# Patient Record
Sex: Female | Born: 1990 | Race: Black or African American | Hispanic: No | Marital: Single | Smoking: Current every day smoker
Health system: Southern US, Community
[De-identification: ages and names within clinical notes are randomized; demographics above are authoritative.]

## PROBLEM LIST (undated history)

## (undated) ENCOUNTER — Ambulatory Visit (HOSPITAL_COMMUNITY): Admission: EM | Discharge status: Against Medical Advice | Payer: Medicaid Other

## (undated) ENCOUNTER — Inpatient Hospital Stay (HOSPITAL_COMMUNITY): Payer: Self-pay

## (undated) DIAGNOSIS — I10 Essential (primary) hypertension: Secondary | ICD-10-CM

## (undated) DIAGNOSIS — F99 Mental disorder, not otherwise specified: Secondary | ICD-10-CM

## (undated) DIAGNOSIS — N739 Female pelvic inflammatory disease, unspecified: Secondary | ICD-10-CM

## (undated) DIAGNOSIS — F329 Major depressive disorder, single episode, unspecified: Secondary | ICD-10-CM

## (undated) DIAGNOSIS — Z8619 Personal history of other infectious and parasitic diseases: Secondary | ICD-10-CM

## (undated) DIAGNOSIS — G4730 Sleep apnea, unspecified: Secondary | ICD-10-CM

## (undated) DIAGNOSIS — A749 Chlamydial infection, unspecified: Secondary | ICD-10-CM

## (undated) DIAGNOSIS — A549 Gonococcal infection, unspecified: Secondary | ICD-10-CM

## (undated) DIAGNOSIS — Z8759 Personal history of other complications of pregnancy, childbirth and the puerperium: Secondary | ICD-10-CM

## (undated) DIAGNOSIS — O24419 Gestational diabetes mellitus in pregnancy, unspecified control: Secondary | ICD-10-CM

## (undated) DIAGNOSIS — N83209 Unspecified ovarian cyst, unspecified side: Secondary | ICD-10-CM

## (undated) DIAGNOSIS — F32A Depression, unspecified: Secondary | ICD-10-CM

## (undated) HISTORY — DX: Personal history of other complications of pregnancy, childbirth and the puerperium: Z87.59

## (undated) HISTORY — PX: KNEE SURGERY: SHX244

---
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
## 2010-03-25 ENCOUNTER — Emergency Department (HOSPITAL_COMMUNITY): Admission: EM | Admit: 2010-03-25 | Discharge: 2010-03-25 | Payer: Self-pay | Admitting: Family Medicine
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
## 2010-09-13 ENCOUNTER — Emergency Department (HOSPITAL_COMMUNITY): Admission: EM | Admit: 2010-09-13 | Discharge: 2010-09-14 | Payer: Self-pay | Admitting: Emergency Medicine

## 2010-09-13 IMAGING — CR DG CHEST 2V
2 series · 2 of 2 positions shown · non-contrast
Comparison: None.

CLINICAL DATA: Cough and shortness of breath.

CHEST - 2 VIEW

[w chest pa]
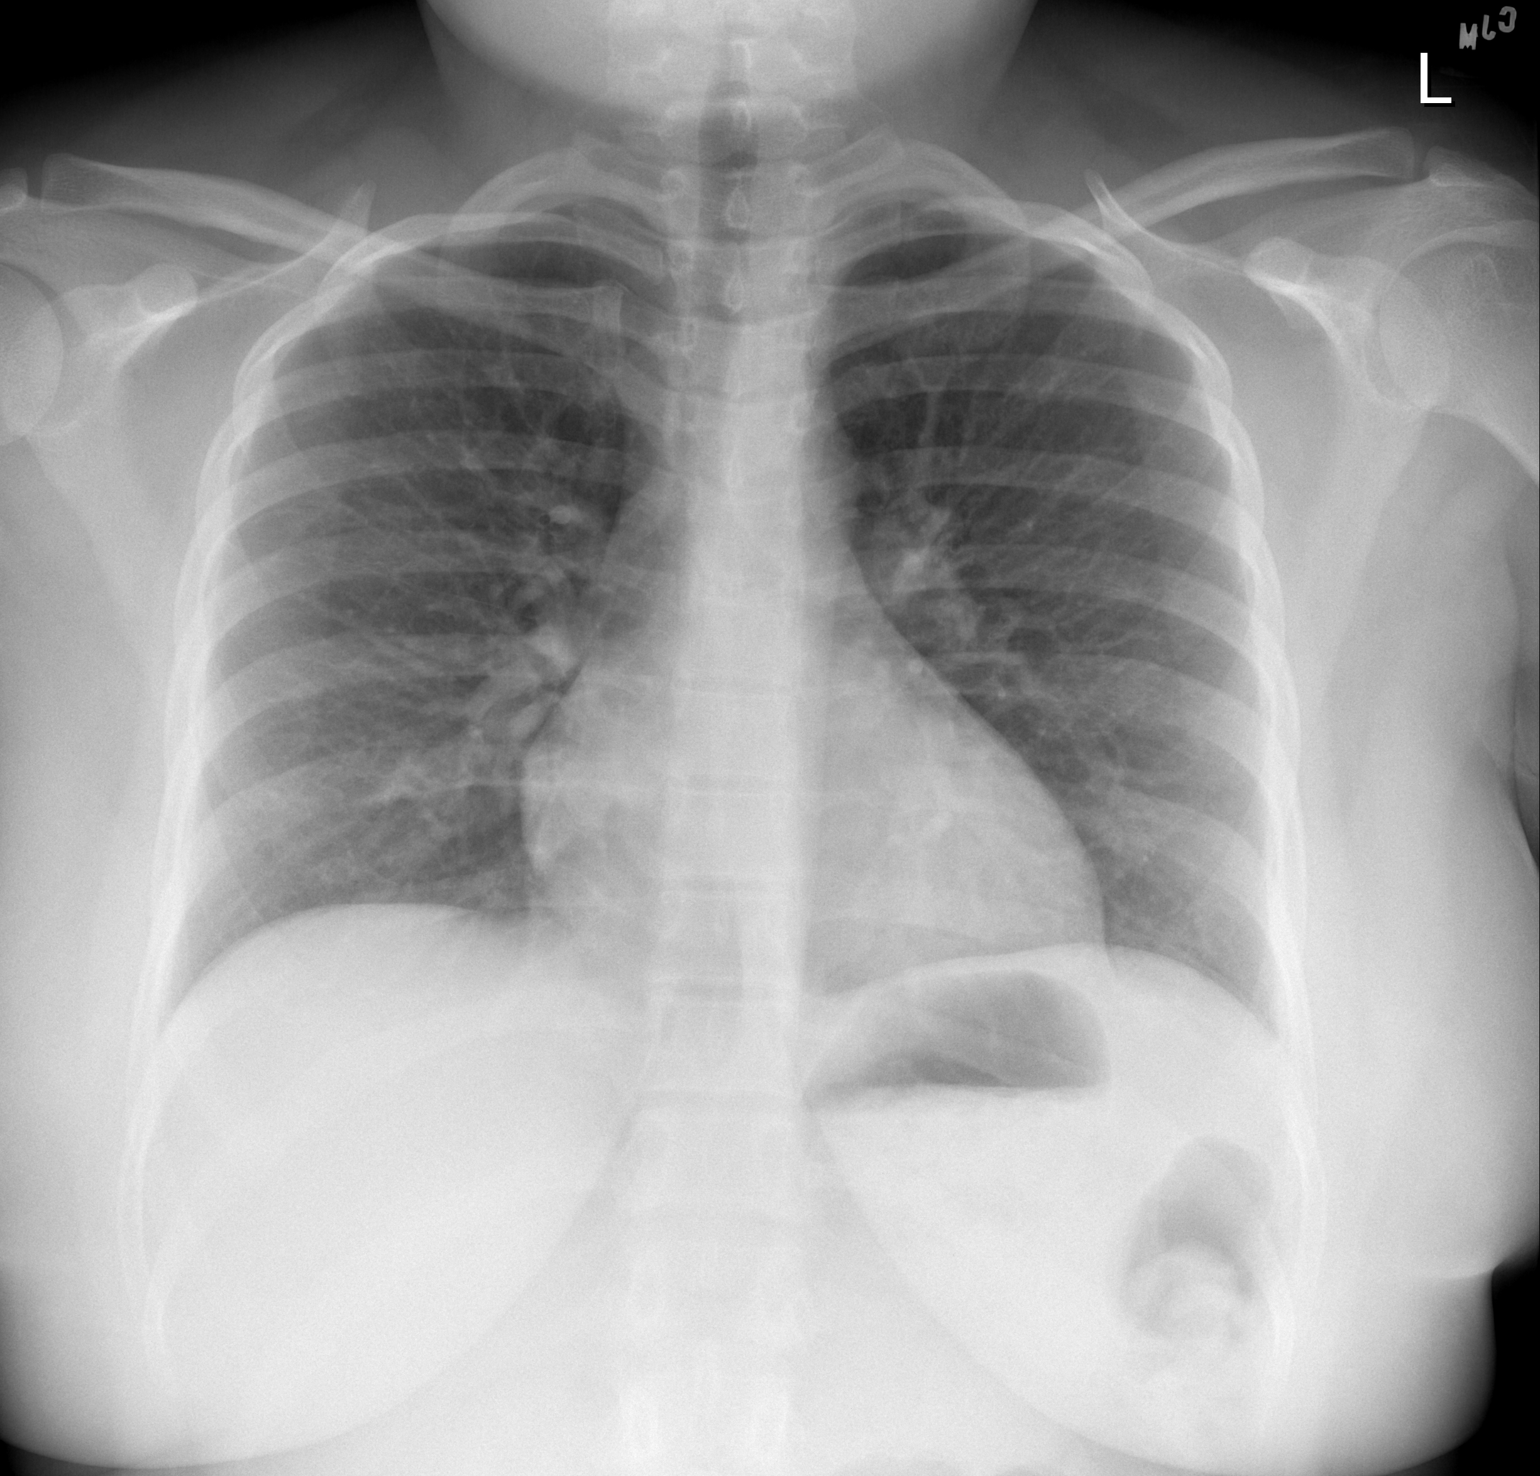

[w chest lat]
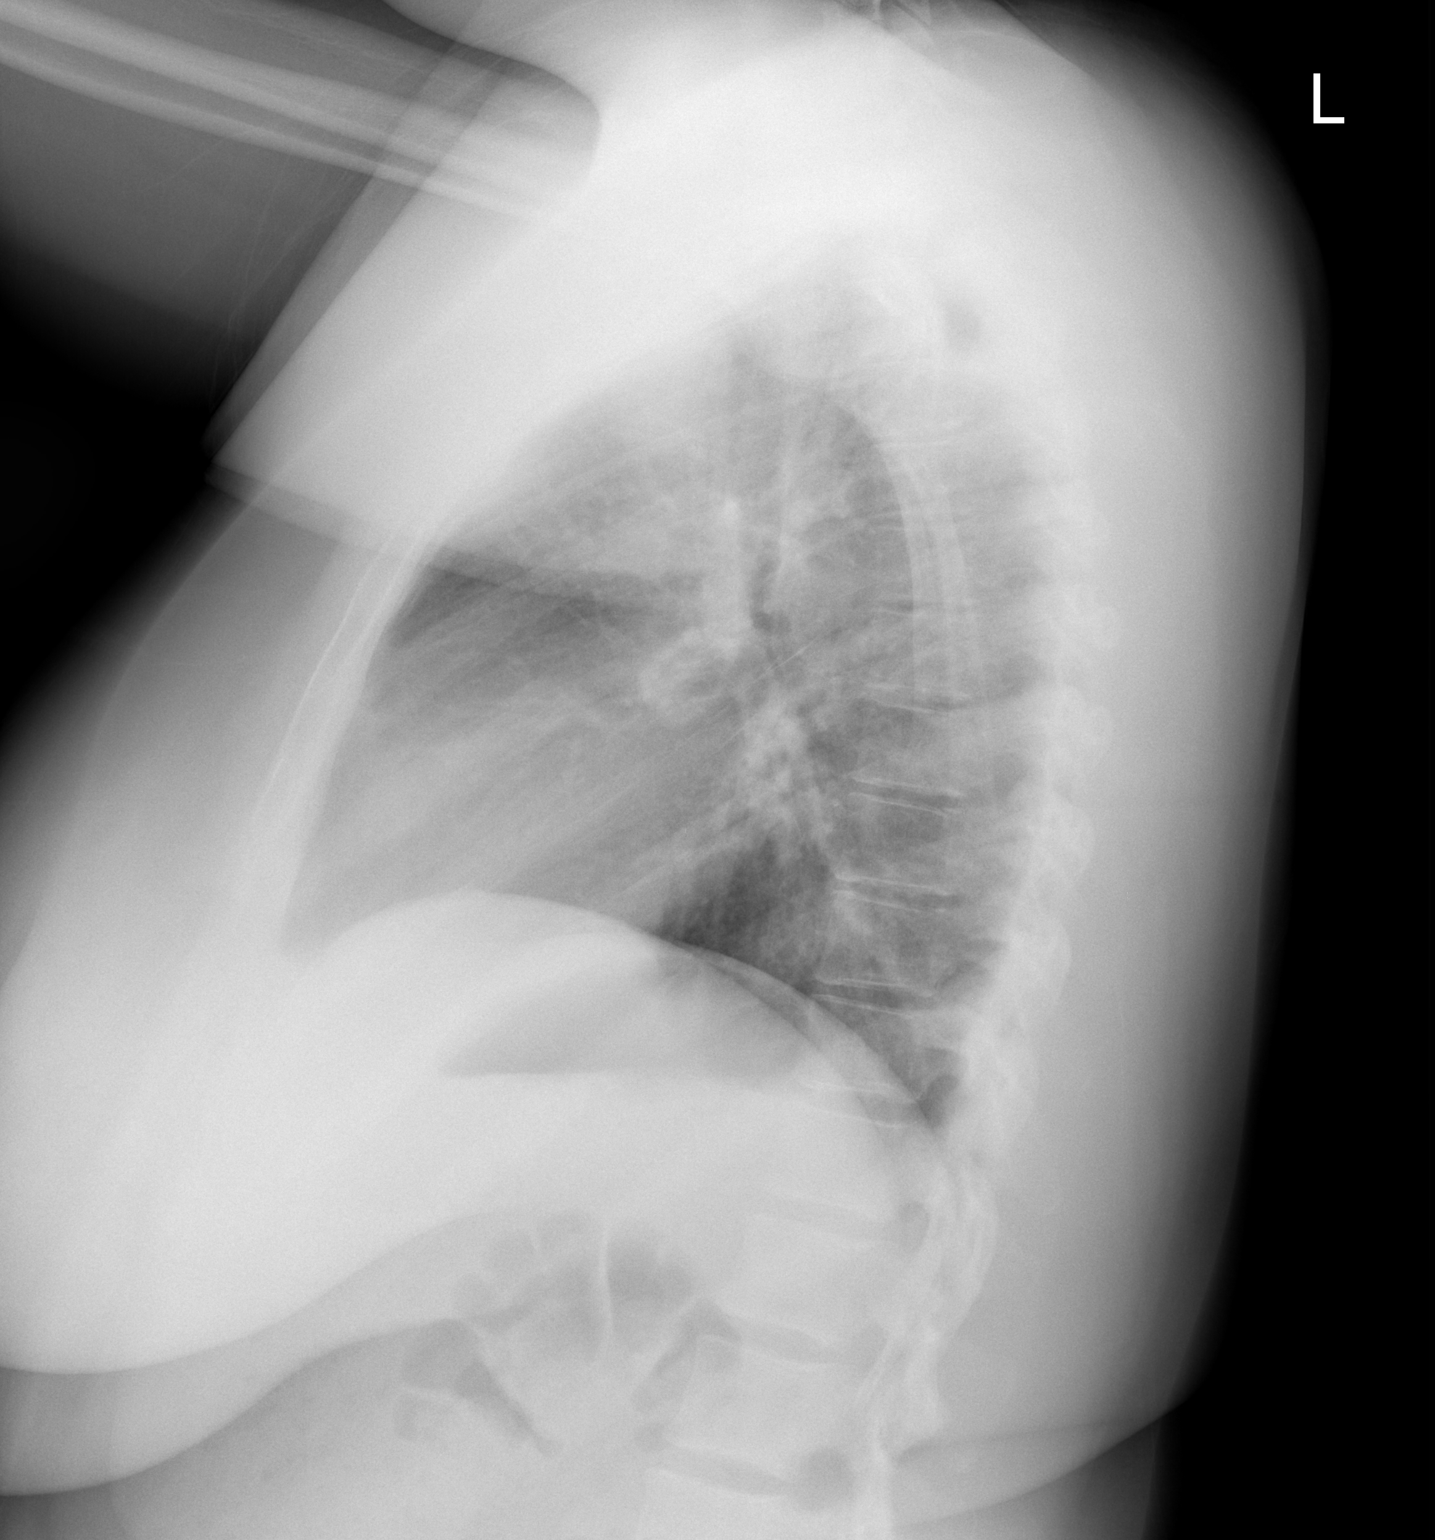

[2 of 2 positions shown; findings below may reference images not displayed]

FINDINGS: The lungs are well-aerated.  Mild retrocardiac left lower
lobe opacity may reflect mild pneumonia.  There is no evidence of
pleural effusion or pneumothorax.

The heart is normal in size; the mediastinal contour is within
normal limits.  No acute osseous abnormalities are seen.
IMPRESSION: Mild retrocardiac left lower lobe opacity may reflect mild
pneumonia.

## 2010-10-27 ENCOUNTER — Other Ambulatory Visit: Payer: Self-pay | Admitting: Emergency Medicine

## 2010-10-28 ENCOUNTER — Ambulatory Visit: Payer: Self-pay | Admitting: Psychiatry

## 2010-10-28 ENCOUNTER — Inpatient Hospital Stay (HOSPITAL_COMMUNITY): Admission: AD | Admit: 2010-10-28 | Discharge: 2010-11-01 | Payer: Self-pay | Admitting: Psychiatry

## 2010-10-29 ENCOUNTER — Encounter: Payer: Self-pay | Admitting: Psychiatry

## 2010-10-29 IMAGING — CT CT HEAD W/O CM
1 series · 16 of 30 positions shown, 20 images · non-contrast
Comparison: None available.

CLINICAL DATA: Severe headache and visual changes.

CT HEAD WITHOUT CONTRAST
TECHNIQUE: Contiguous axial images were obtained from the base of
the skull through the vertex without contrast.

[Series 2: headseq 4.8 h45s · axial · 0.41mm/px · z∈[-232,-80]mm · 16 of 36 slices shown, 20 images]
[im 2/36  brain]
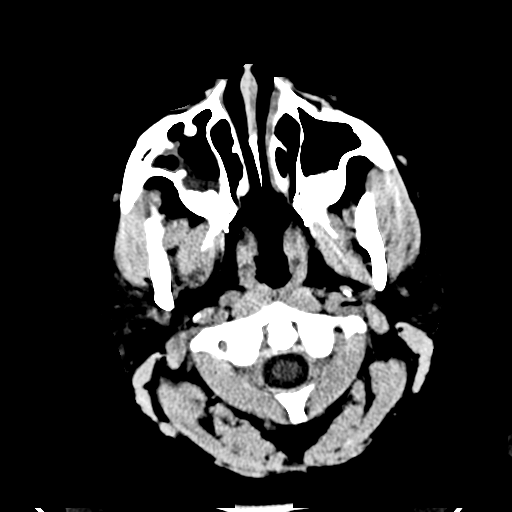
[im 2/36  bone]
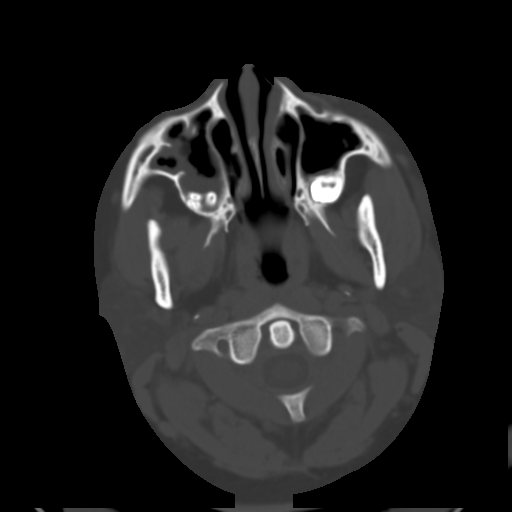
[im 4/36  brain]
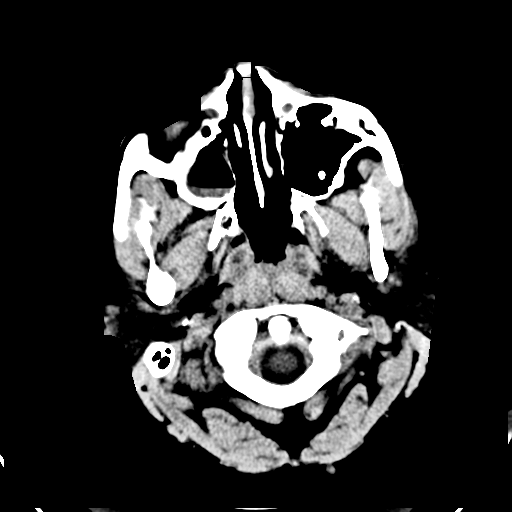
[im 7/36  brain]
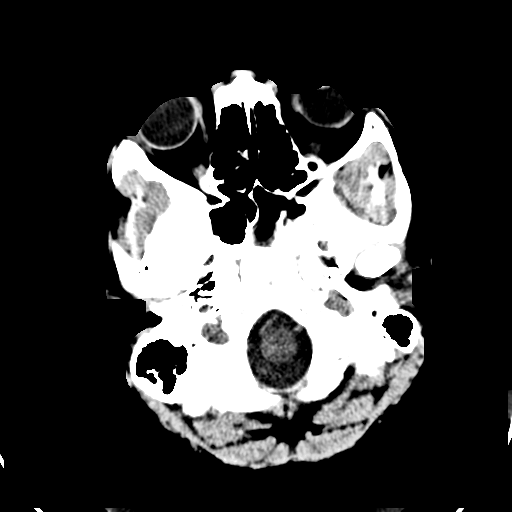
[im 9/36  brain]
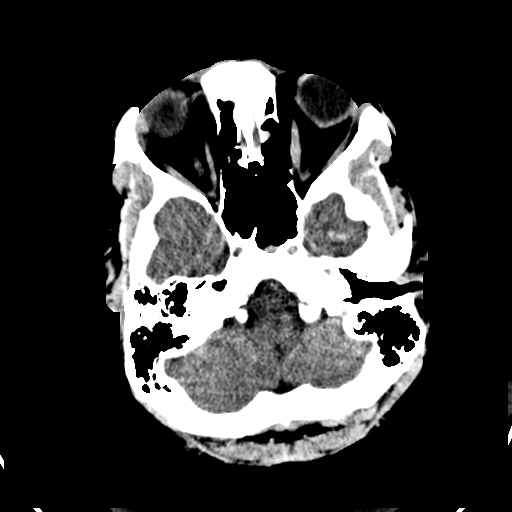
[im 10/36  brain]
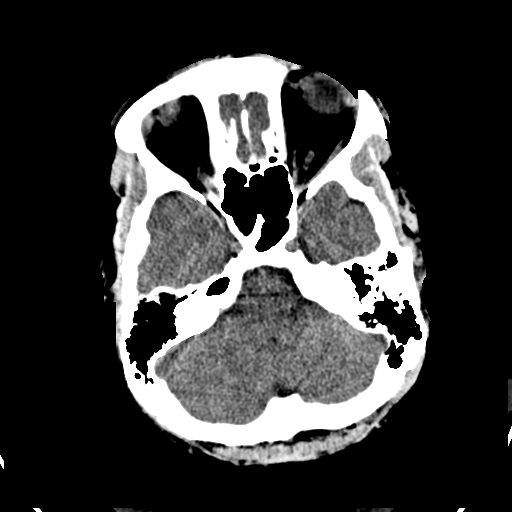
[im 10/36  bone]
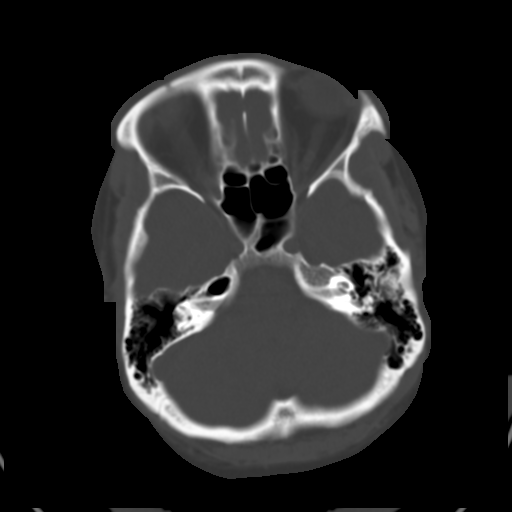
[im 13/36  brain]
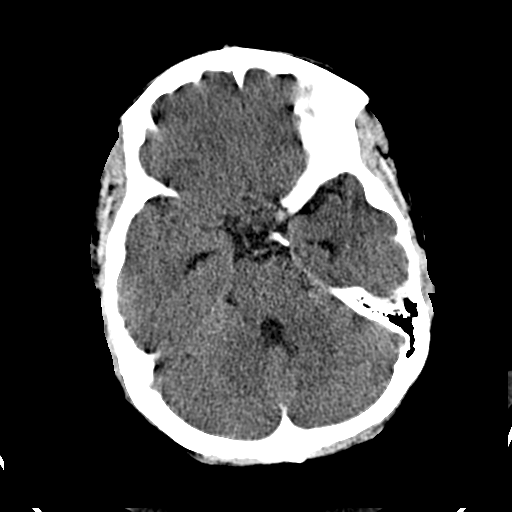
[im 15/36  brain]
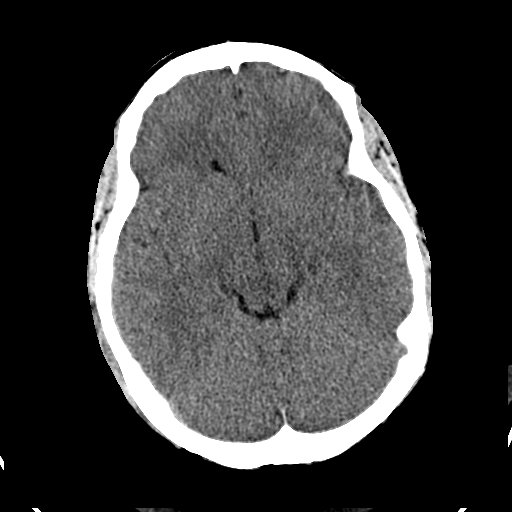
[im 17/36  brain]
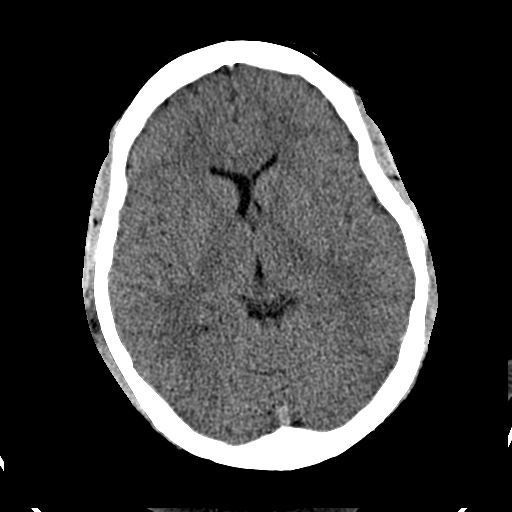
[im 19/36  brain]
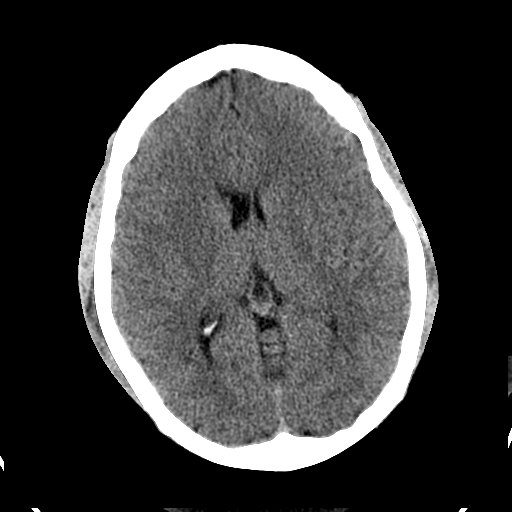
[im 19/36  bone]
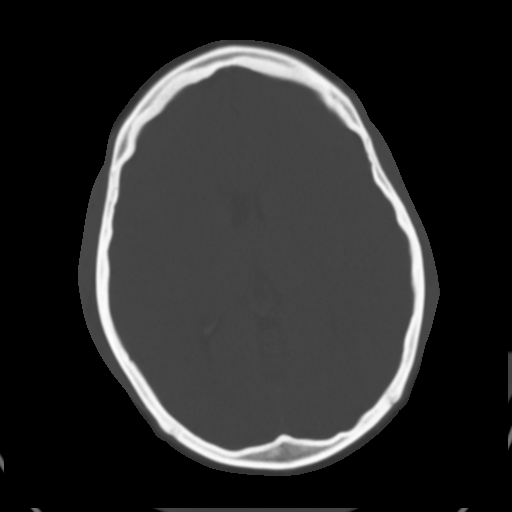
[im 21/36  brain]
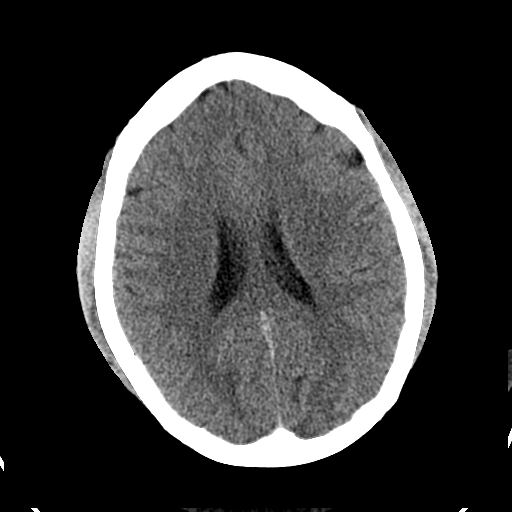
[im 23/36  brain]
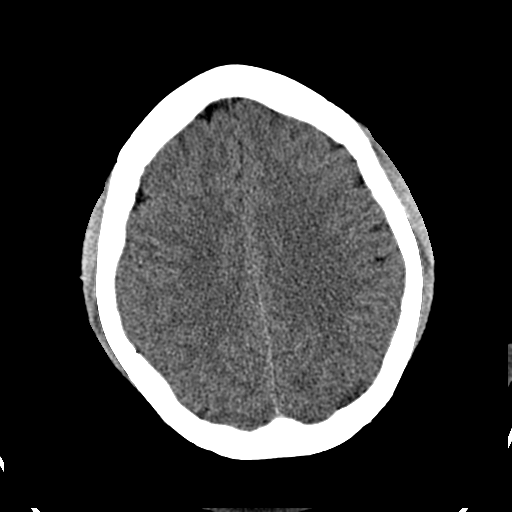
[im 26/36  brain]
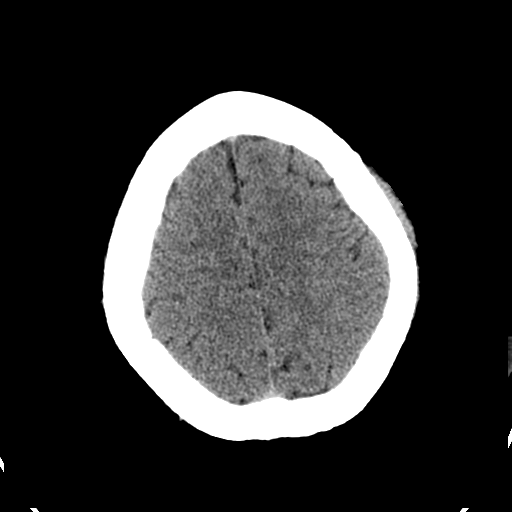
[im 27/36  brain]
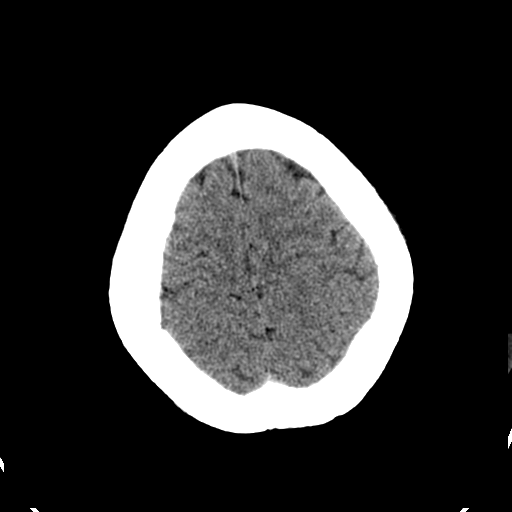
[im 27/36  bone]
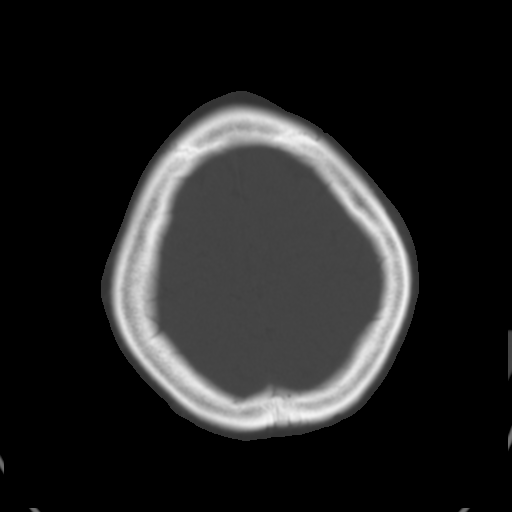
[im 29/36  brain]
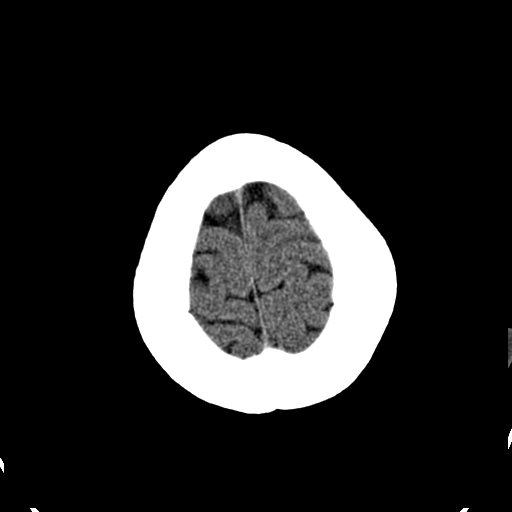
[im 32/36  brain]
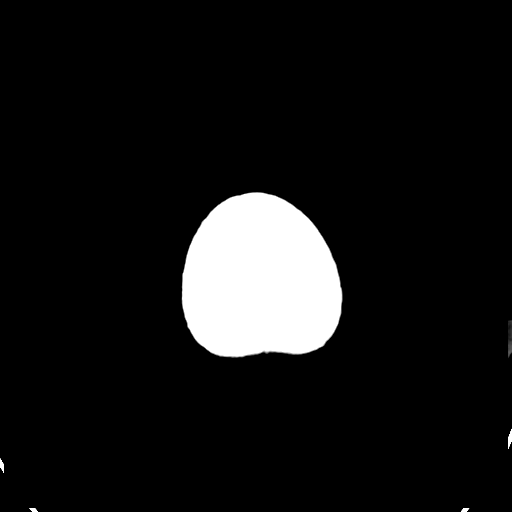
[im 34/36  brain]
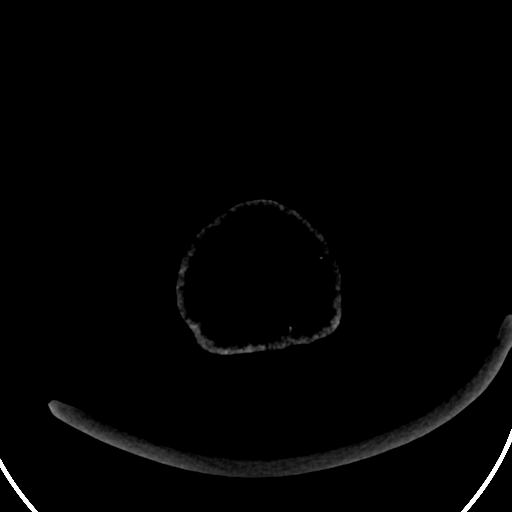

[16 of 30 positions shown; findings below may reference images not displayed]

FINDINGS: No acute cortical infarct, hemorrhage, mass lesion,
hydrocephalus, or significant extra-axial fluid collection is
present.  The maxillary sinuses are small with circumferential
mucosal thickening suggesting chronic disease.  An air-fluid level
is noted within the left sphenoid sinus.  An air-fluid level is
also present in the right maxillary sinus.  Mild mucosal thickening
is due to the ethmoid air cells.  The mastoid air cells are clear
bilaterally.  The osseous skull is intact.
IMPRESSION: 1.  Normal CT the brain.
2.  Sinus disease with acute on chronic disease in the maxillary
sinuses and acute disease in the left sphenoid sinus.

## 2010-11-03 ENCOUNTER — Emergency Department (HOSPITAL_COMMUNITY): Admission: EM | Admit: 2010-11-03 | Discharge: 2010-08-02 | Payer: Self-pay | Admitting: Emergency Medicine

## 2011-02-07 LAB — ACETAMINOPHEN LEVEL

## 2011-02-07 LAB — DIFFERENTIAL
Basophils Absolute: 0 10*3/uL (ref 0.0–0.1)
Basophils Relative: 0 % (ref 0–1)
Eosinophils Absolute: 0 10*3/uL (ref 0.0–0.7)
Monocytes Relative: 4 % (ref 3–12)
Neutro Abs: 9.8 10*3/uL — ABNORMAL HIGH (ref 1.7–7.7)
Neutrophils Relative %: 79 % — ABNORMAL HIGH (ref 43–77)

## 2011-02-07 LAB — RAPID URINE DRUG SCREEN, HOSP PERFORMED
Benzodiazepines: NOT DETECTED
Cocaine: NOT DETECTED
Opiates: NOT DETECTED
Tetrahydrocannabinol: NOT DETECTED

## 2011-02-07 LAB — GLUCOSE, RANDOM: Glucose, Bld: 78 mg/dL (ref 70–99)

## 2011-02-07 LAB — POCT PREGNANCY, URINE: Preg Test, Ur: NEGATIVE

## 2011-02-07 LAB — URINE CULTURE: Colony Count: >=100000

## 2011-02-07 LAB — URINE MICROSCOPIC-ADD ON

## 2011-02-07 LAB — BASIC METABOLIC PANEL
BUN: 10 mg/dL (ref 6–23)
Calcium: 9.4 mg/dL (ref 8.4–10.5)
GFR calc non Af Amer: >60 mL/min (ref >60)
Potassium: 3.4 mEq/L — ABNORMAL LOW (ref 3.5–5.1)

## 2011-02-07 LAB — RPR: RPR Ser Ql: NONREACTIVE

## 2011-02-07 LAB — LIPID PANEL

## 2011-02-07 LAB — CBC
Hemoglobin: 15.7 g/dL — ABNORMAL HIGH (ref 12.0–15.0)
MCH: 28.6 pg (ref 26.0–34.0)
MCHC: 34.1 g/dL (ref 30.0–36.0)
Platelets: 323 10*3/uL (ref 150–400)
RDW: 13.4 % (ref 11.5–15.5)

## 2011-02-07 LAB — HEMOGLOBIN A1C: Mean Plasma Glucose: 105 mg/dL (ref <117)

## 2011-02-07 LAB — URINALYSIS, ROUTINE W REFLEX MICROSCOPIC
Bilirubin Urine: NEGATIVE
Hgb urine dipstick: NEGATIVE
Protein, ur: NEGATIVE mg/dL
Urobilinogen, UA: 0.2 mg/dL (ref 0.0–1.0)

## 2011-02-09 LAB — POCT PREGNANCY, URINE

## 2011-02-14 LAB — URINE CULTURE: Colony Count: >=100000

## 2011-02-14 LAB — WET PREP, GENITAL: Yeast Wet Prep HPF POC: NONE SEEN

## 2011-02-14 LAB — POCT URINALYSIS DIP (DEVICE)
Hgb urine dipstick: NEGATIVE
Ketones, ur: NEGATIVE mg/dL
Protein, ur: NEGATIVE mg/dL
Specific Gravity, Urine: 1.025 (ref 1.005–1.030)
Urobilinogen, UA: 4 mg/dL — ABNORMAL HIGH (ref 0.0–1.0)

## 2011-02-14 LAB — POCT PREGNANCY, URINE

## 2011-02-14 LAB — GC/CHLAMYDIA PROBE AMP, GENITAL

## 2011-03-03 LAB — URINALYSIS, ROUTINE W REFLEX MICROSCOPIC
Bilirubin Urine: NEGATIVE
Nitrite: NEGATIVE
Protein, ur: NEGATIVE mg/dL
Specific Gravity, Urine: 1.019 (ref 1.005–1.030)
Urobilinogen, UA: 0.2 mg/dL (ref 0.0–1.0)

## 2011-03-03 LAB — URINE MICROSCOPIC-ADD ON

## 2011-03-03 LAB — URINE CULTURE: Colony Count: 65000

## 2011-03-06 LAB — URINALYSIS, ROUTINE W REFLEX MICROSCOPIC
Bilirubin Urine: NEGATIVE
Glucose, UA: NEGATIVE mg/dL
Ketones, ur: NEGATIVE mg/dL
Protein, ur: 30 mg/dL — AB
Urobilinogen, UA: 1 mg/dL (ref 0.0–1.0)

## 2011-03-06 LAB — URINE CULTURE: Colony Count: >=100000

## 2011-03-06 LAB — POCT PREGNANCY, URINE: Preg Test, Ur: NEGATIVE

## 2011-03-06 LAB — URINE MICROSCOPIC-ADD ON
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
## 2012-03-20 ENCOUNTER — Emergency Department (HOSPITAL_COMMUNITY): Payer: Medicaid Other

## 2012-03-20 ENCOUNTER — Emergency Department (HOSPITAL_COMMUNITY)
Admission: EM | Admit: 2012-03-20 | Discharge: 2012-03-20 | Disposition: A | Discharge status: Home / Self Care | Payer: Medicaid Other | Attending: Emergency Medicine | Admitting: Emergency Medicine

## 2012-03-20 ENCOUNTER — Encounter (HOSPITAL_COMMUNITY): Payer: Self-pay | Admitting: *Deleted

## 2012-03-20 DIAGNOSIS — R059 Cough, unspecified: Secondary | ICD-10-CM | POA: Insufficient documentation

## 2012-03-20 DIAGNOSIS — R0989 Other specified symptoms and signs involving the circulatory and respiratory systems: Secondary | ICD-10-CM | POA: Insufficient documentation

## 2012-03-20 DIAGNOSIS — R05 Cough: Secondary | ICD-10-CM

## 2012-03-20 DIAGNOSIS — R072 Precordial pain: Secondary | ICD-10-CM | POA: Insufficient documentation

## 2012-03-20 DIAGNOSIS — R042 Hemoptysis: Secondary | ICD-10-CM | POA: Insufficient documentation

## 2012-03-20 DIAGNOSIS — R110 Nausea: Secondary | ICD-10-CM | POA: Insufficient documentation

## 2012-03-20 DIAGNOSIS — R0609 Other forms of dyspnea: Secondary | ICD-10-CM | POA: Insufficient documentation

## 2012-03-20 DIAGNOSIS — E669 Obesity, unspecified: Secondary | ICD-10-CM | POA: Insufficient documentation

## 2012-03-20 DIAGNOSIS — R0602 Shortness of breath: Secondary | ICD-10-CM | POA: Insufficient documentation

## 2012-03-20 LAB — CBC
HCT: 40.7 % (ref 36.0–46.0)
Hemoglobin: 13.5 g/dL (ref 12.0–15.0)
MCV: 83.7 fL (ref 78.0–100.0)
Platelets: 269 10*3/uL (ref 150–400)
RBC: 4.86 MIL/uL (ref 3.87–5.11)
WBC: 10.3 10*3/uL (ref 4.0–10.5)

## 2012-03-20 LAB — COMPREHENSIVE METABOLIC PANEL
ALT: 15 U/L (ref 0–35)
Alkaline Phosphatase: 82 U/L (ref 39–117)
Chloride: 102 mEq/L (ref 96–112)
GFR calc Af Amer: >90 mL/min (ref >90)
Glucose, Bld: 76 mg/dL (ref 70–99)
Potassium: 4.1 mEq/L (ref 3.5–5.1)
Sodium: 137 mEq/L (ref 135–145)
Total Protein: 7 g/dL (ref 6.0–8.3)

## 2012-03-20 LAB — PROTIME-INR: Prothrombin Time: 13.6 seconds (ref 11.6–15.2)

## 2012-03-20 LAB — DIFFERENTIAL
Eosinophils Relative: 2 % (ref 0–5)
Lymphocytes Relative: 33 % (ref 12–46)
Lymphs Abs: 3.4 10*3/uL (ref 0.7–4.0)
Monocytes Absolute: 0.5 10*3/uL (ref 0.1–1.0)
Monocytes Relative: 5 % (ref 3–12)

## 2012-03-20 IMAGING — CR DG CHEST 2V
2 series · 2 of 2 positions shown · non-contrast
Comparison: [DATE]

CLINICAL DATA: Chest pain.  Cough.  Hemoptysis.

CHEST - 2 VIEW

[w chest pa]
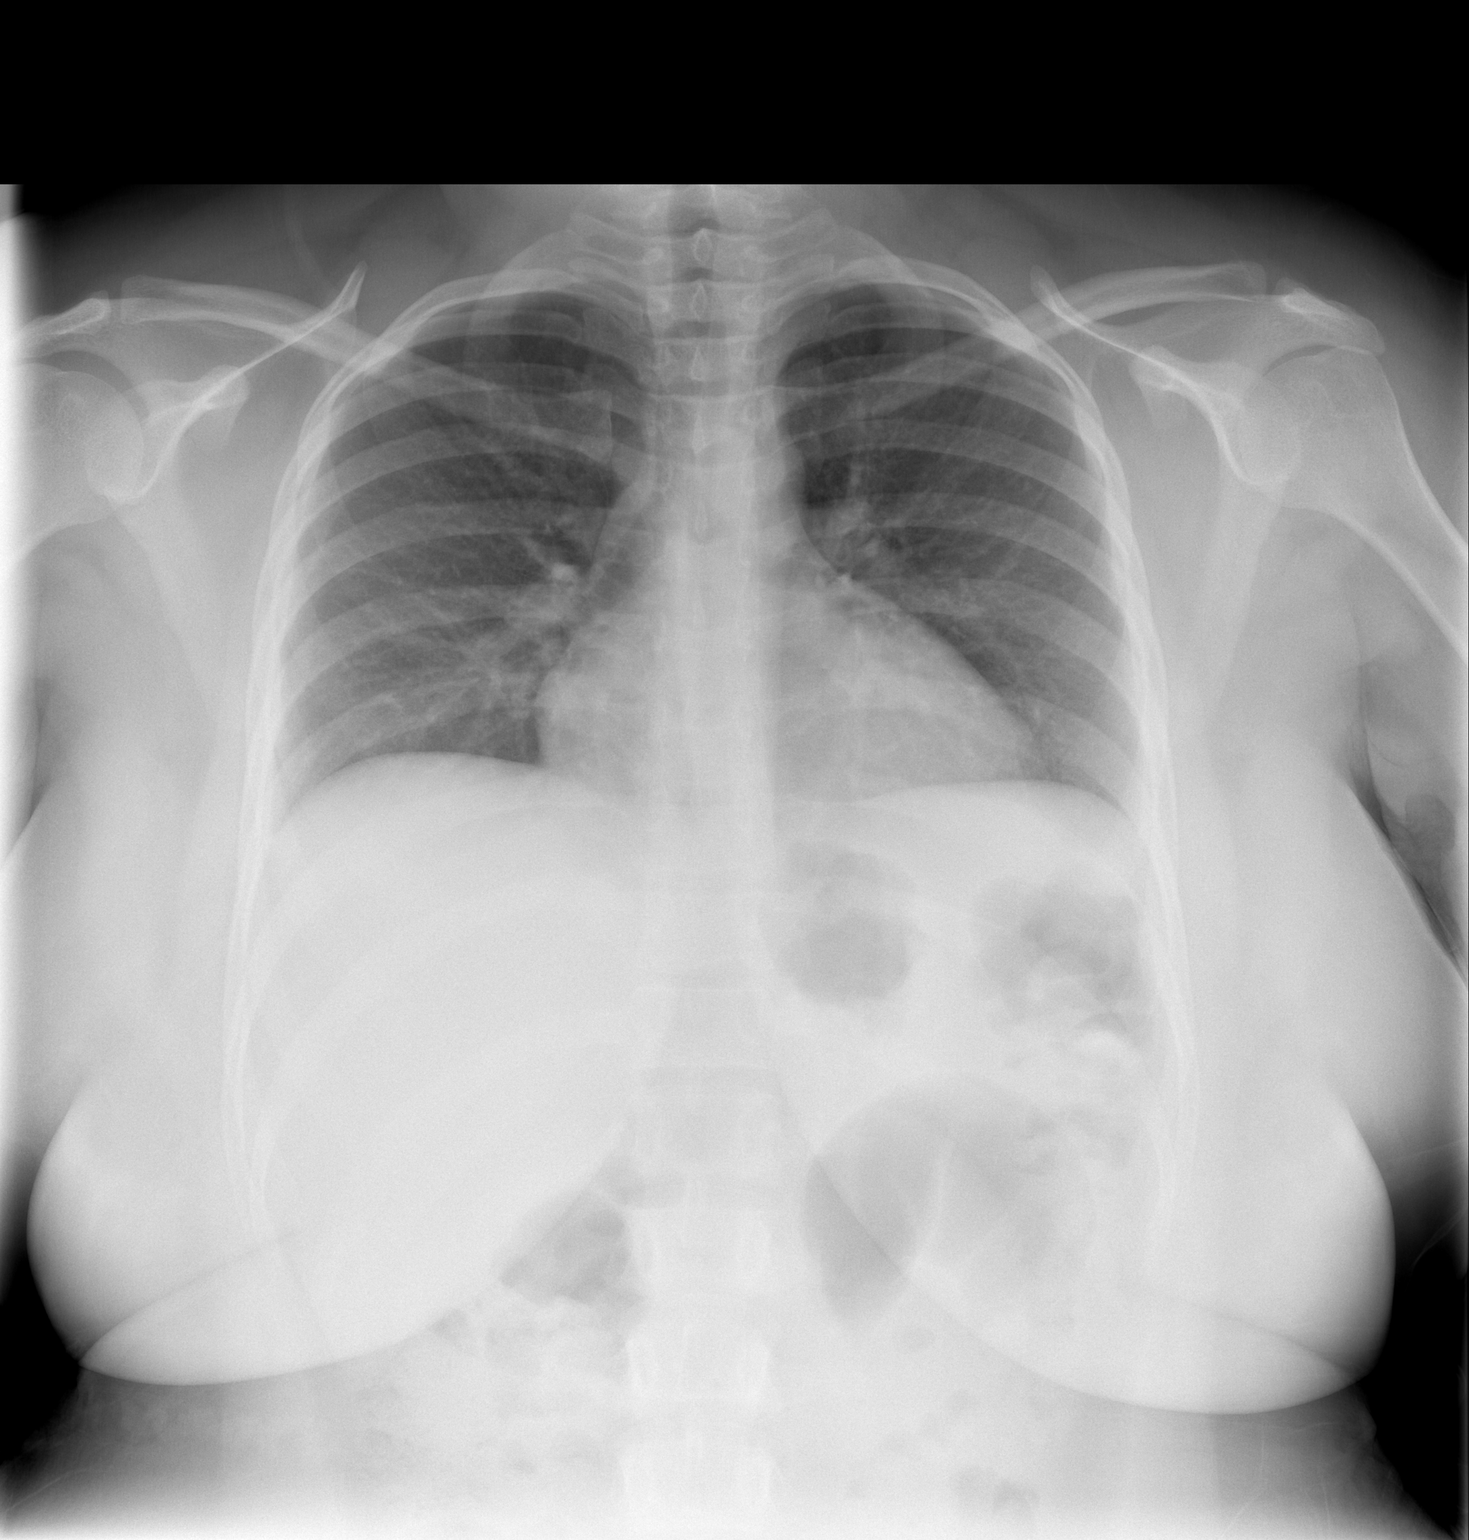

[w chest lat]
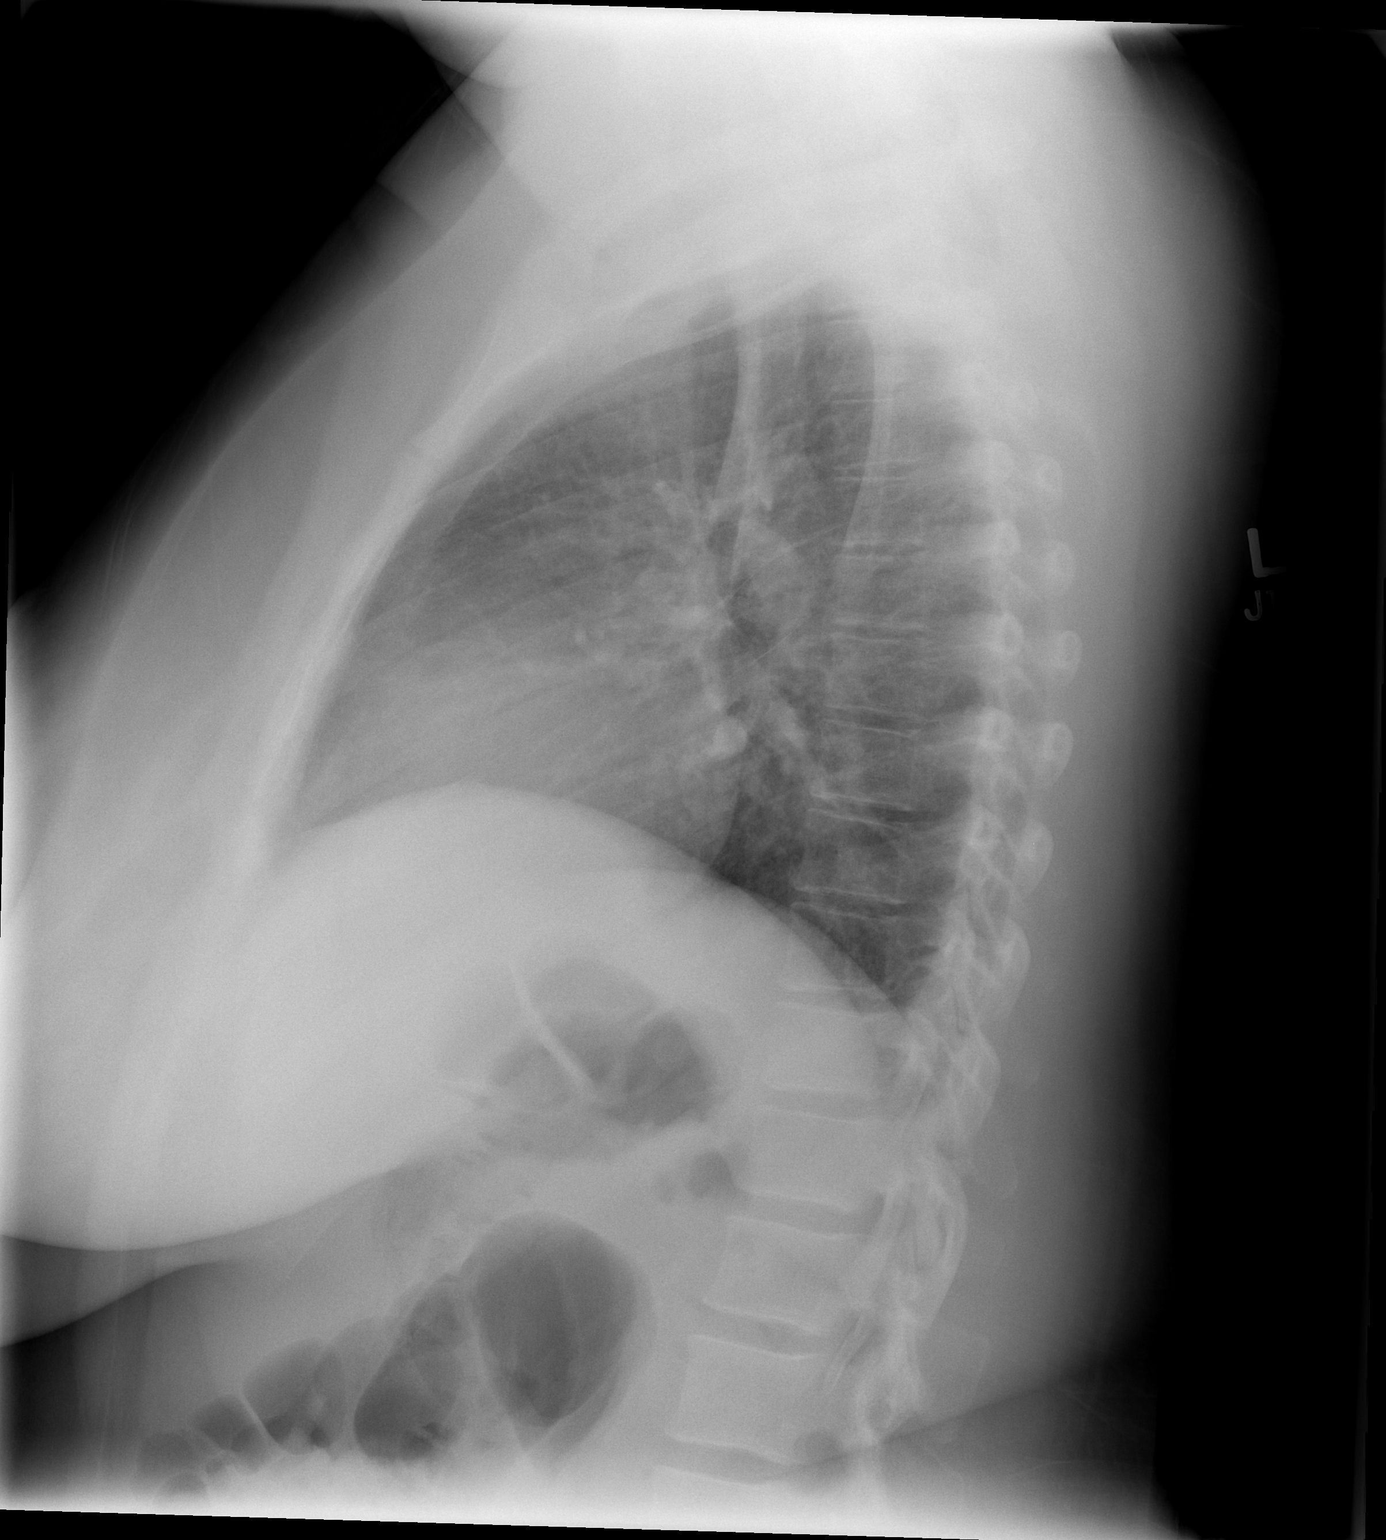

[2 of 2 positions shown; findings below may reference images not displayed]

FINDINGS: The patient has taken a poor inspiration.  Allowing for
that, heart mediastinal shadows are normal.  Lungs are clear.  No
effusions.  No bony abnormalities.
IMPRESSION: Poor inspiration.  Normal chest allowing for that.

## 2012-03-20 IMAGING — CT CT ANGIO CHEST
2 of 6 series · 19 of 46 positions shown · IV contrast (APPLIED)
Comparison: Chest radiography same day

CLINICAL DATA: Cough and shortness of breath.  Nausea.  Some
hemoptysis.

CT ANGIOGRAPHY CHEST
TECHNIQUE: Multidetector CT imaging of the chest using the
standard protocol during bolus administration of intravenous
contrast. Multiplanar reconstructed images including MIPs were
obtained and reviewed to evaluate the vascular anatomy.
Contrast: 100mL OMNIPAQUE IOHEXOL 350 MG/ML SOLN

[Series 8: pulm embolism 1.0 b25f thin · axial · 0.70mm/px · z∈[-238,-8]mm · 16 of 251 slices shown]
[im 11/251  lung]
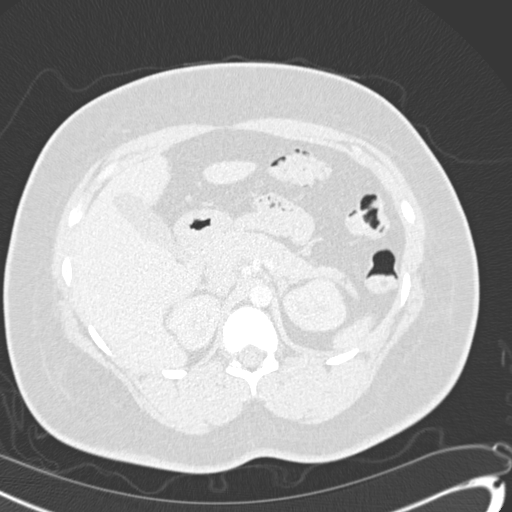
[im 33/251  soft-tissue]
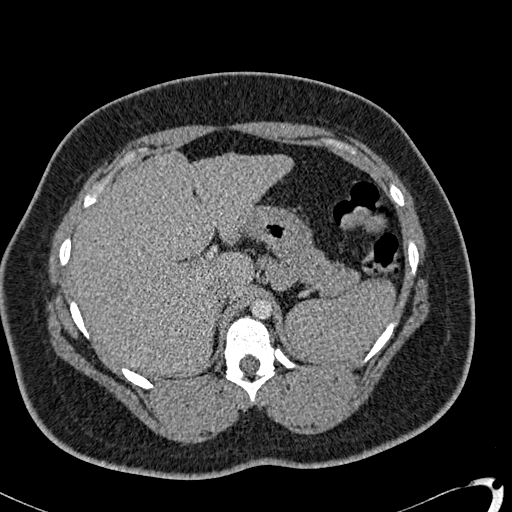
[im 44/251  lung]
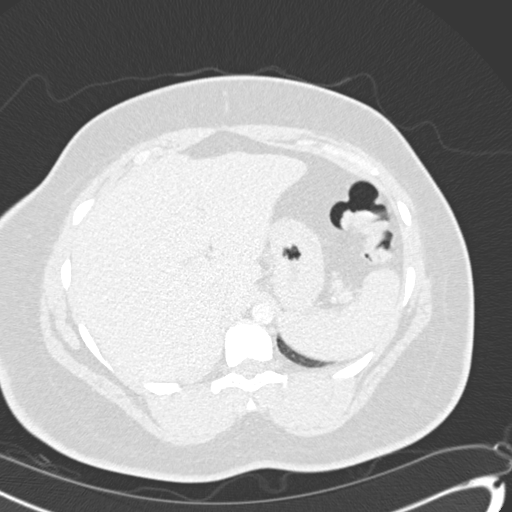
[im 55/251  soft-tissue]
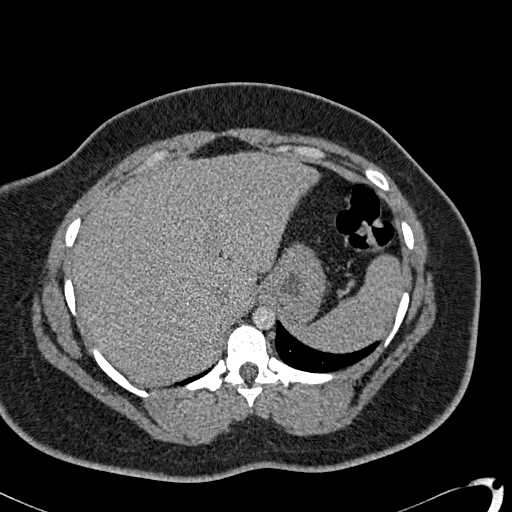
[im 77/251  lung]
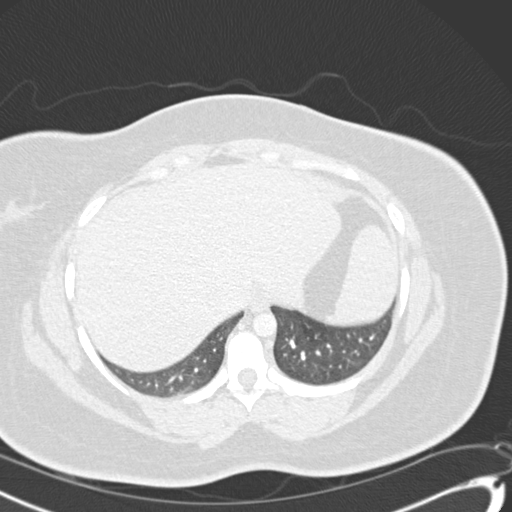
[im 87/251  soft-tissue]
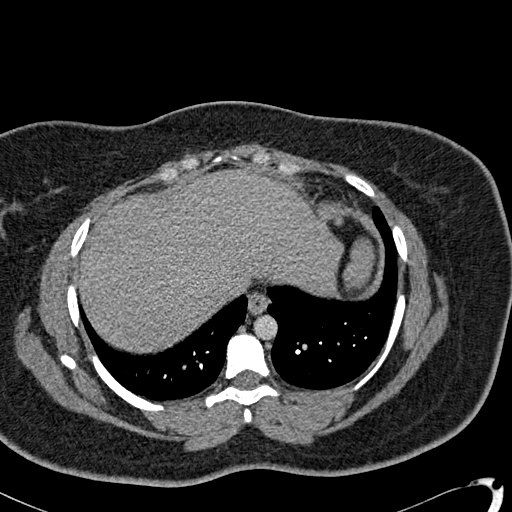
[im 98/251  lung]
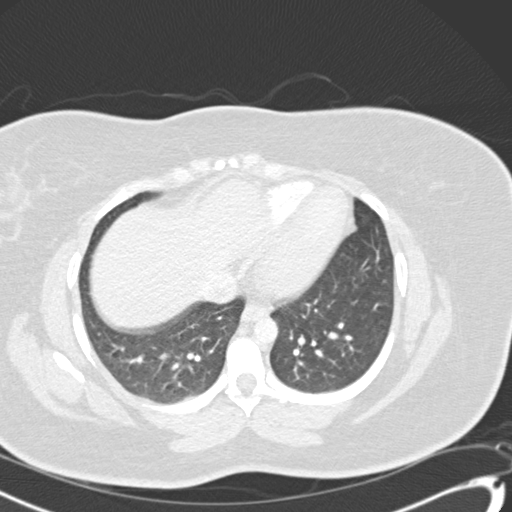
[im 120/251  soft-tissue]
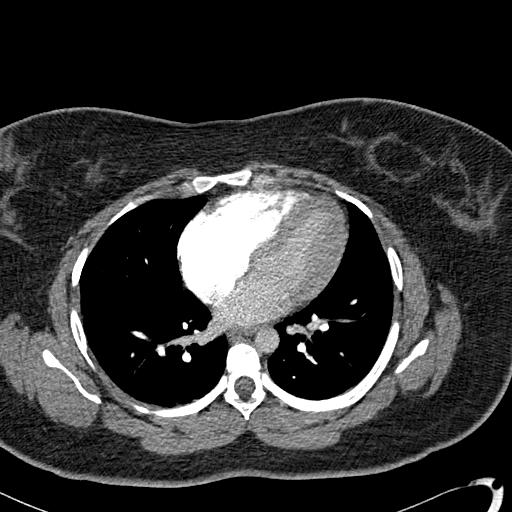
[im 131/251  lung]
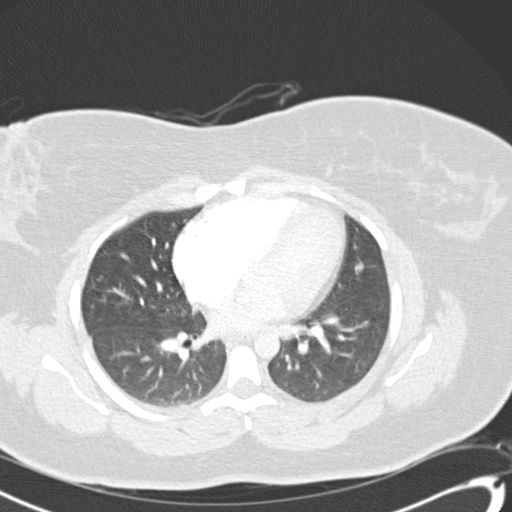
[im 153/251  soft-tissue]
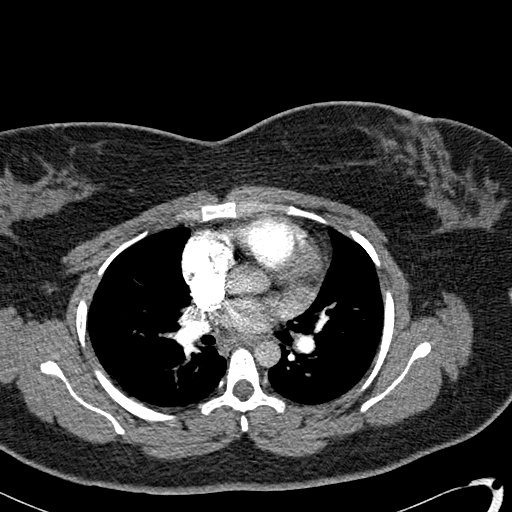
[im 164/251  lung]
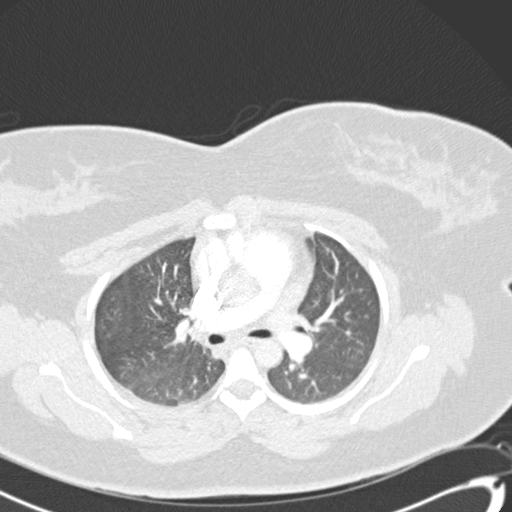
[im 174/251  soft-tissue]
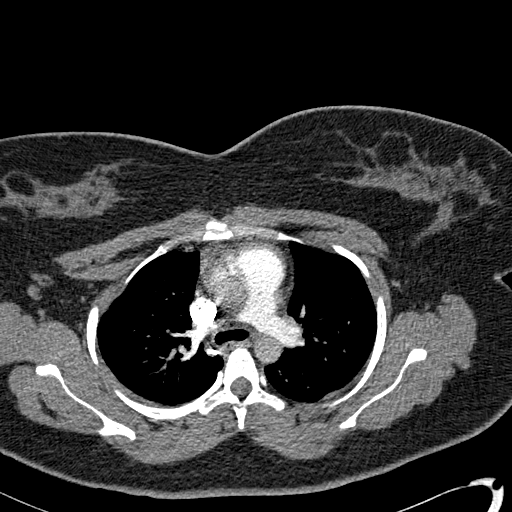
[im 196/251  lung]
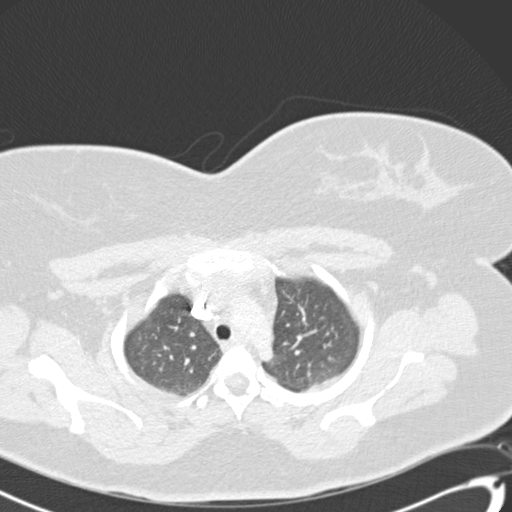
[im 207/251  soft-tissue]
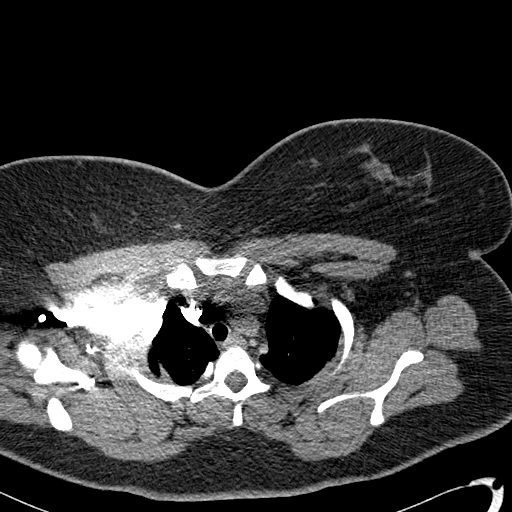
[im 218/251  lung]
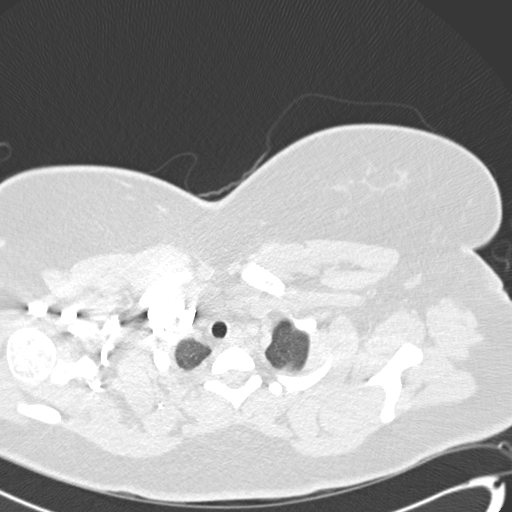
[im 240/251  soft-tissue]
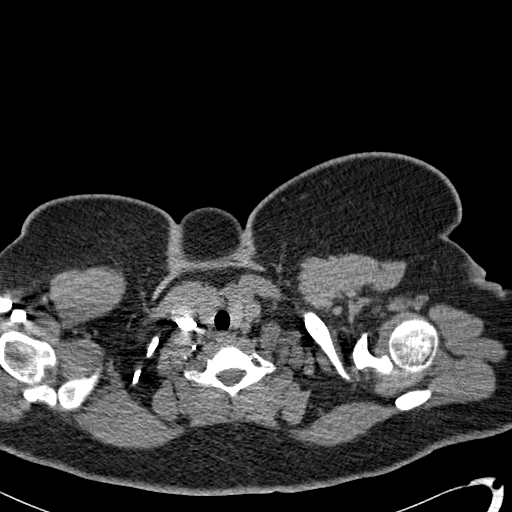

[Series 602: coronal · coronal · 0.70mm/px · 3 of 83 slices shown]
[im 21/83  soft-tissue]
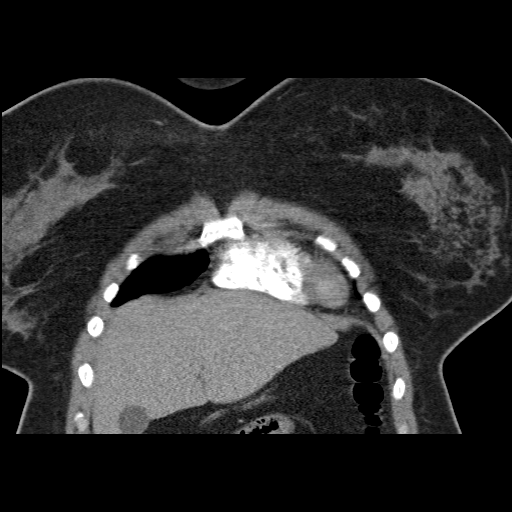
[im 42/83  soft-tissue]
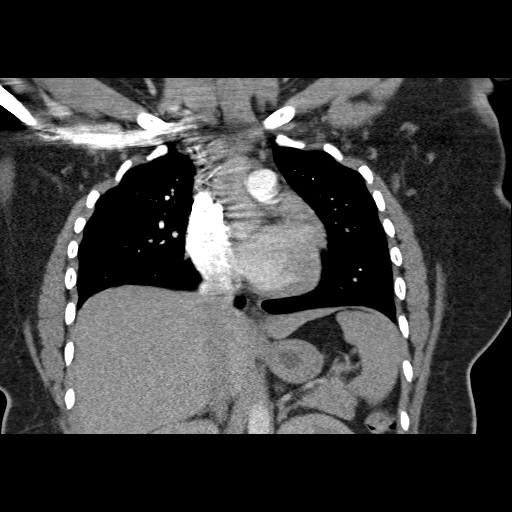
[im 62/83  soft-tissue]
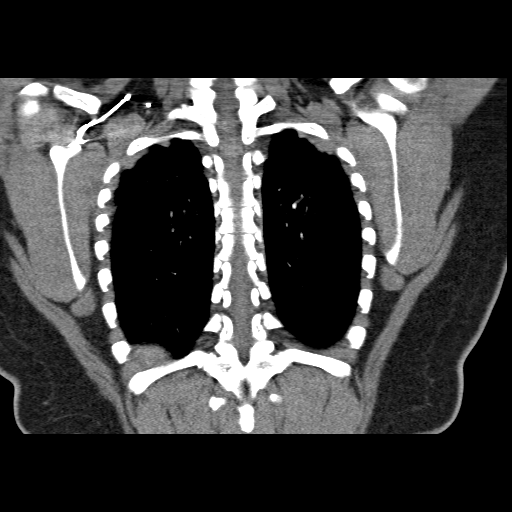

[19 of 46 positions shown; findings below may reference images not displayed]

FINDINGS: Pulmonary arterial opacification is good.  There are no
pulmonary emboli.  No aortic pathology is seen.  No coronary artery
calcification.  The lungs are clear.  No pleural or pericardial
fluid.  Scans in the upper abdomen are unremarkable.
IMPRESSION: Negative exam.  No pulmonary emboli.

## 2012-03-20 MED ORDER — ONDANSETRON HCL 4 MG/2ML IJ SOLN
4.0000 mg | INTRAMUSCULAR | Status: AC | PRN
  Administered 2012-03-20 (×2): 4 mg via INTRAVENOUS
  Filled 2012-03-20 (×2): qty 2

## 2012-03-20 MED ORDER — IOHEXOL 350 MG/ML SOLN
100.0000 mL | Freq: Once | INTRAVENOUS | Status: AC | PRN
  Administered 2012-03-20: 100 mL via INTRAVENOUS

## 2012-03-20 MED ORDER — MORPHINE SULFATE 4 MG/ML IJ SOLN
4.0000 mg | Freq: Once | INTRAMUSCULAR | Status: AC
  Administered 2012-03-20: 4 mg via INTRAVENOUS
  Filled 2012-03-20: qty 1

## 2012-03-20 MED ORDER — OMEPRAZOLE 20 MG PO CPDR: 20.0000 mg | DELAYED_RELEASE_CAPSULE | Freq: Every day | ORAL | Status: DC

## 2012-03-20 NOTE — ED Provider Notes (Signed)
History     CSN: [id number]  Arrival date & time 03/20/12  0830   First MD Initiated Contact with Patient 03/20/12 1040      Chief Complaint  Patient presents with  . Chest Pain  . Cough    (Consider location/radiation/quality/duration/timing/severity/associated sxs/prior treatment) HPI Comments: Patient with a recent  diagnosis of bipolarand two-month hospitalization reports emergency department with a chief complaint of acute onset of substernal chest pain.  Onset of symptoms began at 630 this morning, symptoms are described as a squeezing that is sharp and pleuritic in nature worsened with exertion, duration is constant, severity now is 6/10 gradually improving.  Associated symptoms include nausea, hemoptysis, cough, shortness of breath, dyspnea on exertion.  Patient denies any leg swelling, claudication, diaphoresis, fever, night sweats, chills, change in bowel movements.  Note patient's last menstrual period was late March.  She denies being a smoker, having recent surgery and, but patient is obese with a sedentary lifestyle he.  Patient has recently been started on Depakote and Prozac.  He no other complaints at this time.    Patient is a 21 y.o. female presenting with chest pain and cough. The history is provided by the patient.  Chest Pain Primary symptoms include cough.    Cough Associated symptoms include chest pain.    History reviewed. No pertinent past medical history.  Past Surgical History  Procedure Date  . Knee surgery     No family history on file.  History  Substance Use Topics  . Smoking status: Never Smoker   . Smokeless tobacco: Not on file  . Alcohol Use: No    OB History    Grav Para Term Preterm Abortions TAB SAB Ect Mult Living                  Review of Systems  Respiratory: Positive for cough.   Cardiovascular: Positive for chest pain.  All other systems reviewed and are negative.    Allergies  Review of patient's allergies  indicates no known allergies.  Home Medications   Current Outpatient Rx  Name Route Sig Dispense Refill  . CETIRIZINE HCL 10 MG PO TABS Oral Take 10 mg by mouth daily.    Marland Kitchen VITAMIN D 1000 UNITS PO TABS Oral Take 2,000 Units by mouth 2 (two) times daily.    Marland Kitchen DIVALPROEX SODIUM ER 500 MG PO TB24 Oral Take 500 mg by mouth at bedtime.    Marland Kitchen PROZAC PO Oral Take 1 capsule by mouth daily.      BP 135/76  Pulse 86  Temp(Src) 98.7 F (37.1 C) (Oral)  Resp 20  Ht 5' (1.524 m)  Wt 220 lb (99.791 kg)  BMI 42.97 kg/m2  SpO2 98%  LMP 03/20/2012  Physical Exam  Nursing note and vitals reviewed. Constitutional: She appears well-developed and well-nourished. No distress.  HENT:  Head: Normocephalic and atraumatic.  Eyes: Conjunctivae and EOM are normal. Pupils are equal, round, and reactive to light.  Neck: Normal range of motion. Neck supple. Normal carotid pulses and no JVD present. Carotid bruit is not present. No rigidity. Normal range of motion present.  Cardiovascular: Normal rate, regular rhythm, S1 normal, S2 normal, normal heart sounds, intact distal pulses and normal pulses.  Exam reveals no gallop and no friction rub.   No murmur heard.      No pitting edema bilaterally, RRR, no aberrant sounds on auscultations, distal pulses intact, no carotid bruit or JVD.   Pulmonary/Chest: Effort normal and  breath sounds normal. No accessory muscle usage or stridor. No respiratory distress. She exhibits no tenderness and no bony tenderness.       LCAB  Abdominal: Bowel sounds are normal.       Soft non tender. Non pulsatile aorta.   Skin: Skin is warm, dry and intact. No rash noted. She is not diaphoretic. No cyanosis. Nails show no clubbing.    ED Course  Procedures (including critical care time)  Labs Reviewed - No data to display Dg Chest 2 View  03/20/2012  *RADIOLOGY REPORT*  Clinical Data: Chest pain.  Cough.  Hemoptysis.  CHEST - 2 VIEW  Comparison: 09/13/2010  Findings: The patient  has taken a poor inspiration.  Allowing for that, heart mediastinal shadows are normal.  Lungs are clear.  No effusions.  No bony abnormalities.  IMPRESSION: Poor inspiration.  Normal chest allowing for that.  Original Report Authenticated By: Thomasenia Sales, M.D.   CT ANGIO CHESTIMPRESSION: Negative exam. No pulmonary emboli.    No diagnosis found.    MDM  CP, cough, sob  Labs and radiology reviewed.  No evidence of pulmonary embolus or infectious process evident.  Patient is to be discharged with recommendation to follow up with PCP in regards to today's hospital visit. Chest pain is not likely of cardiac or pulmonary etiology d/t presentation. Pt has been advised start a PPI and return to the ED is CP becomes exertional, associated with diaphoresis or nausea, radiates to left jaw/arm, worsens or becomes concerning in any way. Pt appears reliable for follow up and is agreeable to discharge.   Case has been discussed with and seen by Dr. Rubin Payor  who agrees with the above plan to discharge.    Jaci Carrel, [place] 03/20/12 1255

## 2012-03-20 NOTE — ED Notes (Signed)
Patient reports she developed onset of cough and sob today.  She also reports nausea.  Patient states she has seen bright red blood in her sputum today.  She states her chest is hurting real bad

## 2012-03-20 NOTE — ED Notes (Signed)
Pt continues to feel nauseated. Skin w/d, resp e/u. Resting with family at bedside. Denies pain

## 2012-03-20 NOTE — Discharge Instructions (Signed)
Read instructions below for reasons to return to the Emergency Department. It is recommended that your follow up with your Primary Care Doctor in regards to today's visit. If you do not have a doctor, use the resource guide listed below to help you find one. Begin taking over the counter Prilosec or Zegrid as directed.   Chest Pain (Nonspecific)  HOME CARE INSTRUCTIONS  For the next few days, avoid physical activities that bring on chest pain. Continue physical activities as directed.  Do not smoke cigarettes or drink alcohol until your symptoms are gone.  Only take over-the-counter or prescription medicine for pain, discomfort, or fever as directed by your caregiver.  Follow your caregiver's suggestions for further testing if your chest pain does not go away.  Keep any follow-up appointments you made. If you do not go to an appointment, you could develop lasting (chronic) problems with pain. If there is any problem keeping an appointment, you must call to reschedule.  SEEK MEDICAL CARE IF:  You think you are having problems from the medicine you are taking. Read your medicine instructions carefully.  Your chest pain does not go away, even after treatment.  You develop a rash with blisters on your chest.  SEEK IMMEDIATE MEDICAL CARE IF:  You have increased chest pain or pain that spreads to your arm, neck, jaw, back, or belly (abdomen).  You develop shortness of breath, an increasing cough, or you are coughing up blood.  You have severe back or abdominal pain, feel sick to your stomach (nauseous) or throw up (vomit).  You develop severe weakness, fainting, or chills.  You have an oral temperature above 102 F (38.9 C), not controlled by medicine.   THIS IS AN EMERGENCY. Do not wait to see if the pain will go away. Get medical help at once. Call your local emergency services (911 in [place] Do not drive yourself to the hospital.   RESOURCE GUIDE  Dental Problems  Patients with  Medicaid: [facility]                     [facility] [address]                                           [address] Phone:  [phone]                                                  Phone:  [phone]  If unable to pay or uninsured, contact:  [facility] or [facility] to become qualified for the adult dental clinic.  Chronic Pain Problems Contact [facility]  [phone] Patients need to be referred by their primary care doctor.  Insufficient Money for Medicine Contact [employer]:  call "211" or [facility] 271-5999.  No Primary Care Doctor Call [facility]  832-8000 Other agencies that provide inexpensive medical care    [facility]  832-8035    [facility]  [phone]    [facility]  [phone]    Women's Clinic  832-4777    [facility]  [phone]    [facility]  [phone]  Psychological Services   [facility]  [phone] [facility]  [phone] [facility]   800 853-5163 (emergency services 641-4993)  Substance Abuse Resources [facility]  336-882-2125 [facility] 336-784-9470 [facility] 336-285-9073 [facility] 336-845-3988 Residential & Outpatient Substance Abuse Program  800-659-3381  Abuse/Neglect [place] Child Abuse Hotline (336) 641-3795 [place] Child Abuse Hotline 800-378-5315 (After Hours)  Emergency Shelter [facility] (336) 271-5985  [facility] (336) 275-9566 [facility] (704) 372-4663  MRSA Hotline #:   832-7006    [place] Resources  [facility] [place]     [employer]                          [facility] [address] [place]                       [address]      [address]  [place]                                                 [place]                            [place] Phone:  [phone]                                   Phone:  [phone]                 Phone:  [phone]  [facility] Phone:  [phone]  [place] Child Abuse Hotline (336) 342-1394 (336) 342-3537 (After Hours)   

## 2012-03-23 NOTE — ED Provider Notes (Signed)
Medical screening examination/treatment/procedure(s) were performed by non-physician practitioner and as supervising physician I was immediately available for consultation/collaboration.  Koden Hunzeker R. Kortnie Stovall, MD 03/23/12 0701 

## 2012-05-28 ENCOUNTER — Emergency Department (HOSPITAL_COMMUNITY)
Admission: EM | Admit: 2012-05-28 | Discharge: 2012-05-28 | Disposition: A | Discharge status: Home / Self Care | Payer: Medicaid Other | Attending: Emergency Medicine | Admitting: Emergency Medicine

## 2012-05-28 ENCOUNTER — Encounter (HOSPITAL_COMMUNITY): Payer: Self-pay

## 2012-05-28 ENCOUNTER — Emergency Department (HOSPITAL_COMMUNITY): Payer: Medicaid Other

## 2012-05-28 DIAGNOSIS — M7989 Other specified soft tissue disorders: Secondary | ICD-10-CM

## 2012-05-28 DIAGNOSIS — M79609 Pain in unspecified limb: Secondary | ICD-10-CM

## 2012-05-28 DIAGNOSIS — R609 Edema, unspecified: Secondary | ICD-10-CM | POA: Insufficient documentation

## 2012-05-28 DIAGNOSIS — R079 Chest pain, unspecified: Secondary | ICD-10-CM | POA: Insufficient documentation

## 2012-05-28 LAB — POCT I-STAT, CHEM 8
Glucose, Bld: 116 mg/dL — ABNORMAL HIGH (ref 70–99)
HCT: 44 % (ref 36.0–46.0)
Hemoglobin: 15 g/dL (ref 12.0–15.0)
Potassium: 3.7 mEq/L (ref 3.5–5.1)
Sodium: 141 mEq/L (ref 135–145)

## 2012-05-28 IMAGING — CR DG CHEST 2V
2 series · 2 of 2 positions shown · non-contrast
Comparison: The CT chest [DATE] and chest radiograph 4.4-[RG]

CLINICAL DATA: Leg swelling and chest pain.

CHEST - 2 VIEW

[w chest lat]
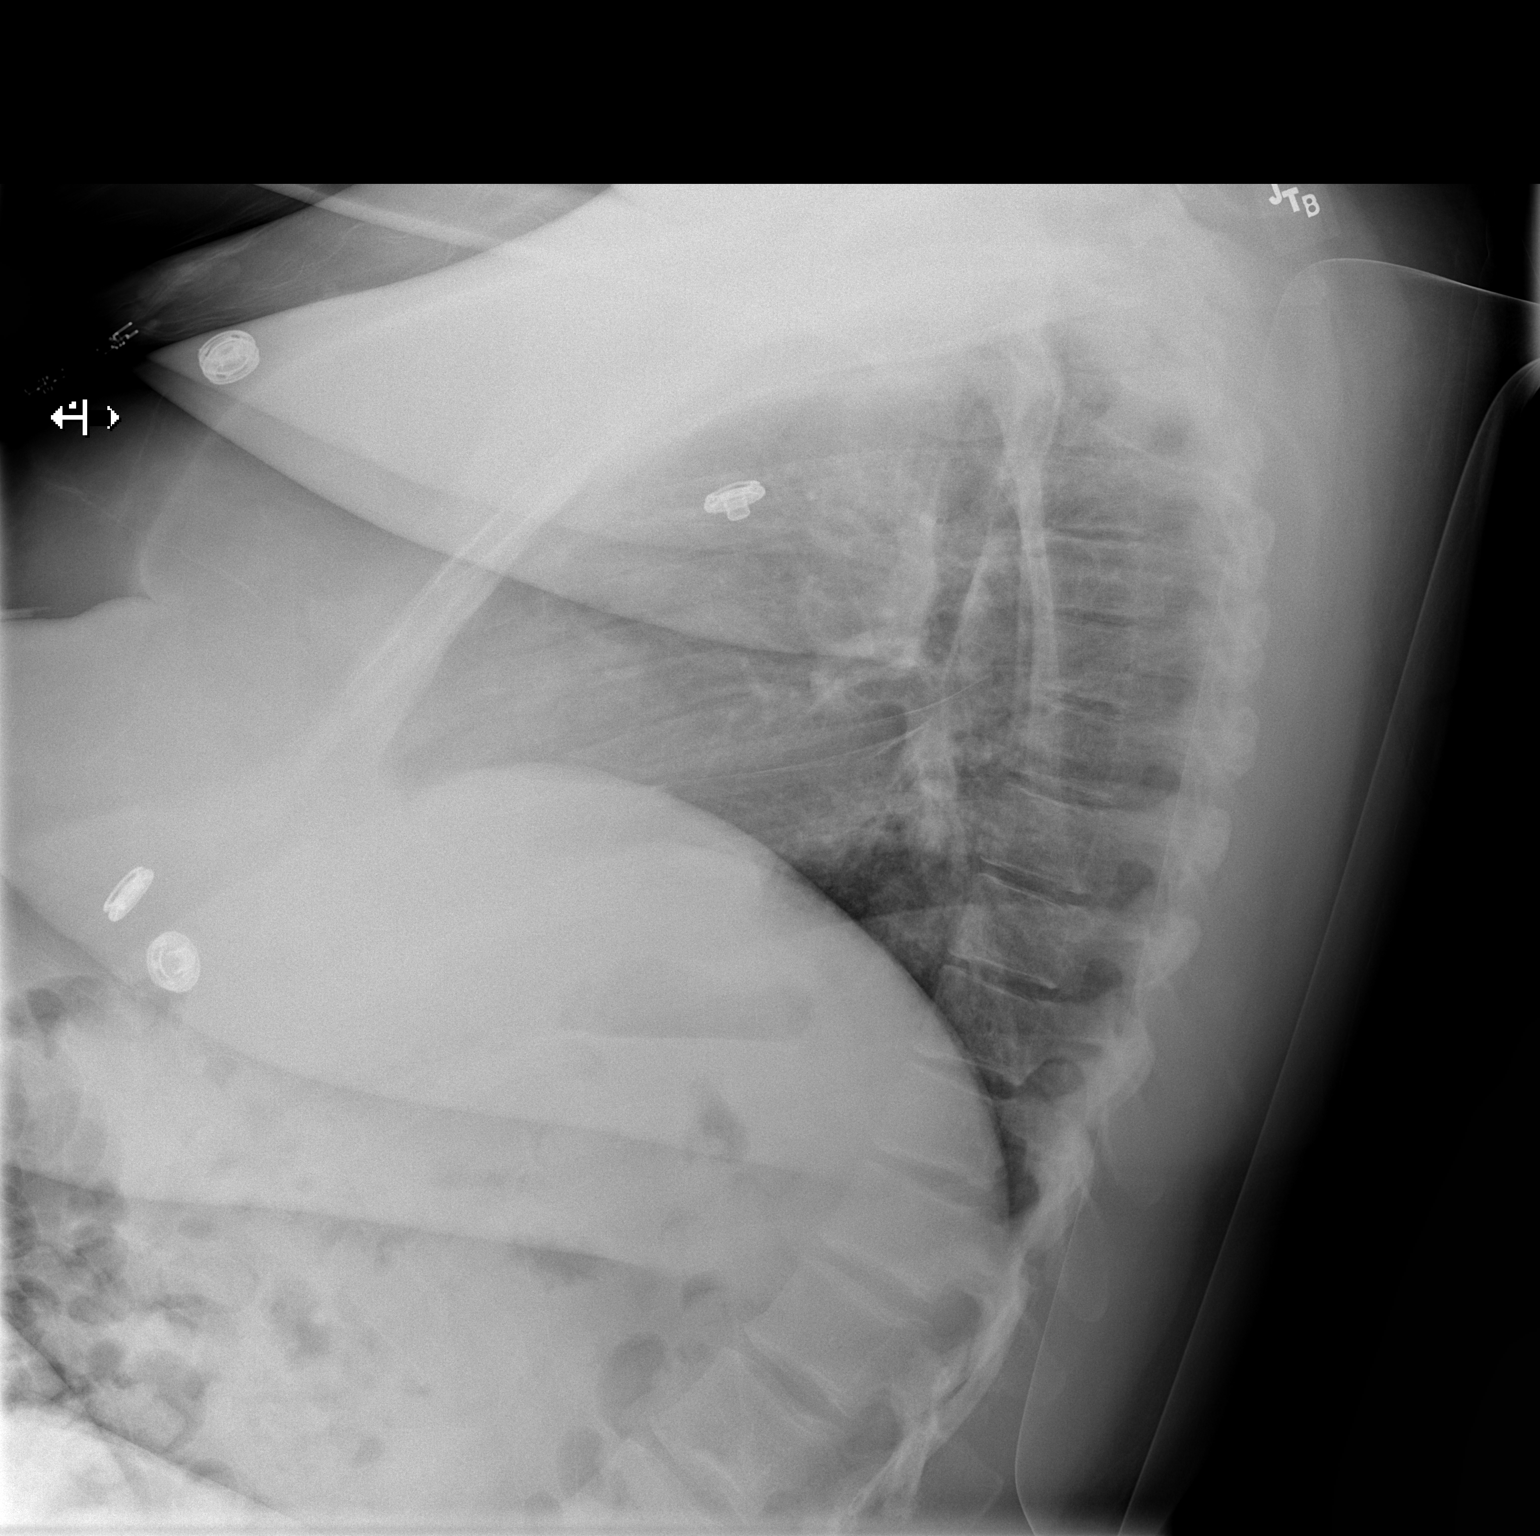

[x chest ap]
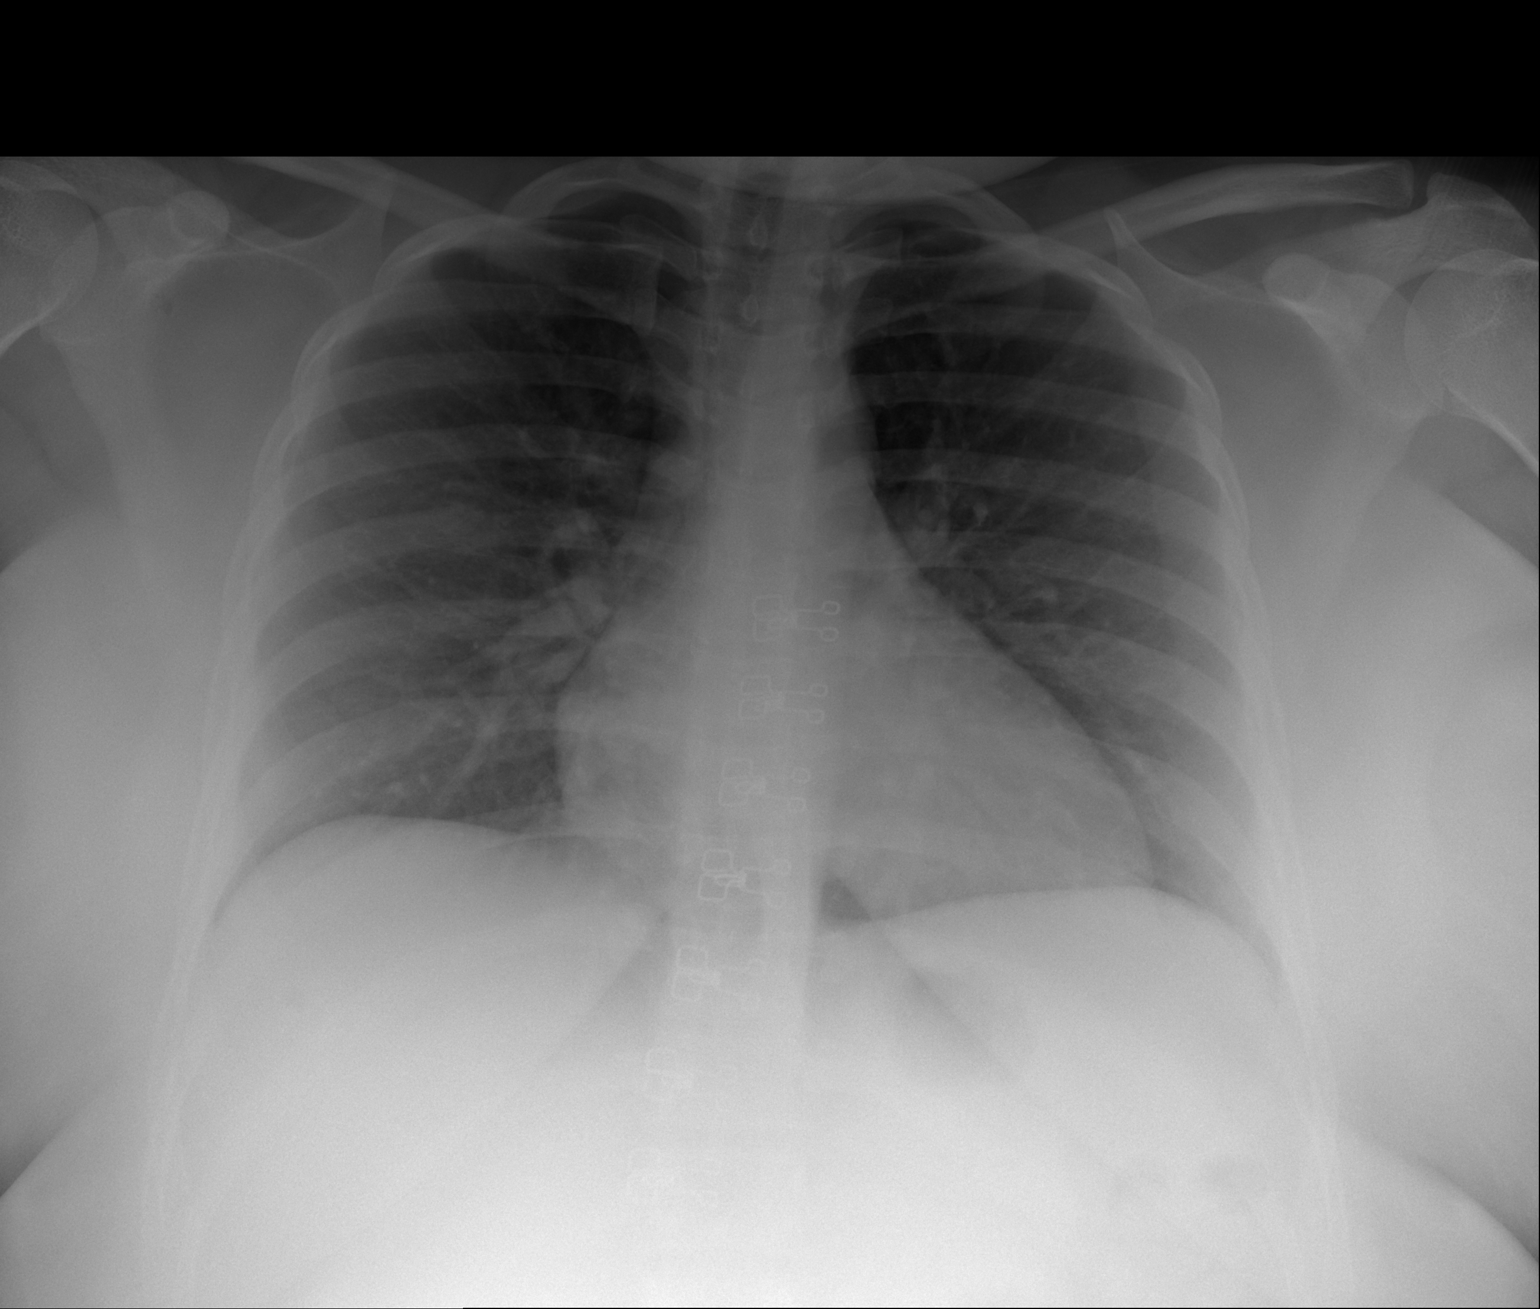

[2 of 2 positions shown; findings below may reference images not displayed]

FINDINGS: The heart, mediastinal, and hilar contours are normal.
Pulmonary vascularity is within normal limits.  Lung volumes
slightly low.  The lungs are clear.  No visible pleural effusion or
pneumothorax.  No acute bony abnormality.  Upper abdomen is
unremarkable.
IMPRESSION: No acute cardiopulmonary disease.

## 2012-05-28 MED ORDER — ACETAMINOPHEN-CODEINE #3 300-30 MG PO TABS
2.0000 | ORAL_TABLET | Freq: Once | ORAL | Status: AC
  Administered 2012-05-28: 2 via ORAL
  Filled 2012-05-28: qty 2

## 2012-05-28 MED ORDER — LORAZEPAM 1 MG PO TABS
1.0000 mg | ORAL_TABLET | Freq: Once | ORAL | Status: AC
  Administered 2012-05-28: 1 mg via ORAL
  Filled 2012-05-28: qty 1

## 2012-05-28 MED ORDER — ACETAMINOPHEN-CODEINE #3 300-30 MG PO TABS: 1.0000 | ORAL_TABLET | Freq: Four times a day (QID) | ORAL | Status: DC | PRN

## 2012-05-28 NOTE — Progress Notes (Signed)
Bilateral lower extremity venous duplex completed.  Preliminary report is no obvious evidence of DVT, SVT, or a Baker's cyst.  Technically difficult study due to the patient's body habitus. 

## 2012-05-28 NOTE — ED Provider Notes (Signed)
History     CSN: [id number]  Arrival date & time 05/28/12  5284   First MD Initiated Contact with Patient 05/28/12 1002      Chief Complaint  Patient presents with  . Leg Swelling    (Consider location/radiation/quality/duration/timing/severity/associated sxs/prior treatment) HPI  Patient presents to emergency department complaining of a one month history waxing and waning bilateral lower extremity edema that she states would come and go however last over the last week has been ongoing persistent swelling. Patient states she's having increasing pain stating "I feel like my feet are going to pop they hurt so much." Patient states she's taken Tylenol for pain without relief. Patient has history of posttraumatic stress disorder for which she takes Depakote and Prozac states she takes no other medicines on regular basis and has no other known medical problems. She denies exogenous estrogen use, recent travel or immobilization, fevers, chills, lower extreme numbness/tingling/weakness, or changes in her skin. Patient states that this morning she woke and felt an acute onset chest discomfort in the middle of her chest and felt short of breath however she states that she then began to have pain "all over my body for my head to my toes." She denies fevers, chills, abdominal pain, nausea, vomiting, diarrhea, dysuria, hematuria, or blood in her stool. Patient states she's elevated her legs without relief of the swelling. Patient does not have a primary care provider. Patient seen at [facility] for her mental health needs. Patient was evaluated in April for chest pain shortness of breath but states at that time the pain was more mild and has been treated for GERD. Patient states she's been taking acid reflux medication with complete resolution of that pain. Patient states the current pain is more severe and different. She had a negative CT angio chest at that time to rule out PE.   History reviewed. No pertinent  past medical history.  Past Surgical History  Procedure Date  . Knee surgery     History reviewed. No pertinent family history.  History  Substance Use Topics  . Smoking status: Never Smoker   . Smokeless tobacco: Not on file  . Alcohol Use: No    OB History    Grav Para Term Preterm Abortions TAB SAB Ect Mult Living                  Review of Systems  All other systems reviewed and are negative.    Allergies  Review of patient's allergies indicates no known allergies.  Home Medications   Current Outpatient Rx  Name Route Sig Dispense Refill  . VITAMIN D PO Oral Take 2 tablets by mouth 2 (two) times daily.    . STOOL SOFTENER PO Oral Take 1 capsule by mouth 2 (two) times daily.      BP 125/69  Pulse 86  Temp 98.1 F (36.7 C) (Oral)  Resp 18  SpO2 100%  LMP 01/22/2012  Physical Exam  Nursing note and vitals reviewed. Constitutional: She is oriented to person, place, and time. She appears well-developed and well-nourished. No distress.  HENT:  Head: Normocephalic and atraumatic.  Eyes: Conjunctivae are normal.  Neck: Normal range of motion. Neck supple.  Cardiovascular: Normal rate, regular rhythm, normal heart sounds and intact distal pulses.  Exam reveals no gallop and no friction rub.   No murmur heard. Pulmonary/Chest: Effort normal and breath sounds normal. No respiratory distress. She has no wheezes. She has no rales. She exhibits no tenderness.  Abdominal:  Soft. Bowel sounds are normal. She exhibits no distension and no mass. There is no tenderness. There is no rebound and no guarding.  Musculoskeletal: Normal range of motion. She exhibits edema and tenderness.       1+ pitting edema bilateral feet and lower extremities from knees down without skin changes, heat, or erythema. Moderate tenderness to palpation of feet and bilateral lower legs. Good pedal pulses bilaterally, and cap refill. Normal sensation of complete lower extremities. Full range of  motion of lower extremities  Neurological: She is alert and oriented to person, place, and time.  Skin: Skin is warm and dry. No rash noted. She is not diaphoretic. No erythema.  Psychiatric: She has a normal mood and affect.    ED Course  Procedures (including critical care time)  PO tylenol #3.    Date: 05/28/2012  Rate: 79  Rhythm: normal sinus rhythm  QRS Axis: normal  Intervals: normal  ST/T Wave abnormalities: normal  Conduction Disutrbances: none  Narrative Interpretation: non provocative  Old EKG Reviewed: No significant changes noted   Labs Reviewed - No data to display Dg Chest 2 View  05/28/2012  *RADIOLOGY REPORT*  Clinical Data: Leg swelling and chest pain.  CHEST - 2 VIEW  Comparison: The CT chest 03/20/2012 and chest radiograph 4.02-1012  Findings: The heart, mediastinal, and hilar contours are normal. Pulmonary vascularity is within normal limits.  Lung volumes slightly low.  The lungs are clear.  No visible pleural effusion or pneumothorax.  No acute bony abnormality.  Upper abdomen is unremarkable.  IMPRESSION: No acute cardiopulmonary disease.  Original Report Authenticated By: Britta Mccreedy, M.D.     1. Peripheral edema   2. Chest pain       MDM  Patient is afebrile and in no acute distress. She has a normal lung exam with no acute findings on chest x-ray. Patient had recent CT angio to rule out PE for chest pain in the recent past. She is PERC negative and is low-risk for PE. At this time I do not think her chest pain is related to PE or acute coronary syndrome. She has bilateral lower extremity swelling without any evidence of infectious and her bilateral lower extremities are neurovascularly intact with good pedal pulses, movement and sensation. Doppler ultrasound was negative for DVT bilaterally. I spoke at length with patient about peripheral edema and the need for elevation and compression as well as establishing care with a primary care provider in the near  future for further evaluation and management as well the need for weight loss. Patient voices her understanding and is agreeable plan.        [place], [place] 05/28/12 1337

## 2012-05-28 NOTE — ED Provider Notes (Signed)
Medical screening examination/treatment/procedure(s) were performed by non-physician practitioner and as supervising physician I was immediately available for consultation/collaboration.   Charles B. Bernette Mayers, MD 05/28/12 [phone]

## 2012-05-28 NOTE — ED Notes (Signed)
Noted pitting edema to both legs and feet bilaterally starting just below the knees.

## 2012-05-28 NOTE — ED Notes (Signed)
Patient states her ankles, feet and lower legs "feel so tight like they could bust". Some shortness of breath. Thinks she may have a little pain in her mid chest area as well. LIght-headed. No cough, or any other viral symptom per patient report. Eating okay. Peed yesterday. Had a hard time with her bowels moving last night.

## 2012-05-28 NOTE — ED Notes (Signed)
Hunt, PA informed re: CP

## 2012-05-31 ENCOUNTER — Emergency Department (HOSPITAL_COMMUNITY)
Admission: EM | Admit: 2012-05-31 | Discharge: 2012-05-31 | Disposition: A | Discharge status: Home / Self Care | Payer: Medicaid Other | Attending: Emergency Medicine | Admitting: Emergency Medicine

## 2012-05-31 ENCOUNTER — Encounter (HOSPITAL_COMMUNITY): Payer: Self-pay | Admitting: Physical Medicine and Rehabilitation

## 2012-05-31 DIAGNOSIS — M940 Chondrocostal junction syndrome [Tietze]: Secondary | ICD-10-CM | POA: Insufficient documentation

## 2012-05-31 MED ORDER — IBUPROFEN 600 MG PO TABS: 600.0000 mg | ORAL_TABLET | Freq: Four times a day (QID) | ORAL | Status: AC | PRN

## 2012-05-31 NOTE — ED Notes (Signed)
Pt presents to department for evaluation of midsternal non radiating chest pain and SOB. Was seen for same on 05/28/12. States pain and SOB have increased. 10/10 pain at the time, becomes worse with movement. Describes as sharp and stabbing. Respirations unlabored. Pt speaking complete sentences. She is alert and oriented x4. Skin warm and dry.

## 2012-05-31 NOTE — ED Notes (Signed)
Patient presents to ed c/o chest pain onset last pm. States it hurt all night she was only able to sleep at short intervals. States pain continued today and pain is worse with movement and inspiration.

## 2012-05-31 NOTE — ED Provider Notes (Signed)
History     CSN: [id number]  Arrival date & time 05/31/12  0918   First MD Initiated Contact with Patient 05/31/12 1008      Chief Complaint  Patient presents with  . Chest Pain  . Shortness of Breath    (Consider location/radiation/quality/duration/timing/severity/associated sxs/prior treatment) HPI Pt presenting with c/o sharp nonradiating chest pain.  Was seen in the ED 7/2 for similar pain.  Pt states the pain is worse at this time.  Pain is 10/10- located in left anterior chest wall, worse with movement.  No sob, no leg swelling, no hx of DVT/PE, no recent travel/trauma/surgery.  Does not take OCPs.  There are no other associated systemic symptoms.  There are no other alleviating or modifying factors.  Pt states she was concerned that her symptoms were a side effect of her depakote.    No past medical history on file.  Past Surgical History  Procedure Date  . Knee surgery     No family history on file.  History  Substance Use Topics  . Smoking status: Never Smoker   . Smokeless tobacco: Not on file  . Alcohol Use: No    OB History    Grav Para Term Preterm Abortions TAB SAB Ect Mult Living                  Review of Systems ROS reviewed and all otherwise negative except for mentioned in HPI  Allergies  Review of patient's allergies indicates no known allergies.  Home Medications   Current Outpatient Rx  Name Route Sig Dispense Refill  . ACETAMINOPHEN-CODEINE #3 300-30 MG PO TABS Oral Take 1-2 tablets by mouth every 6 (six) hours as needed. For pain.    Marland Kitchen VITAMIN D PO Oral Take 2 tablets by mouth 2 (two) times daily.    Marland Kitchen DIVALPROEX SODIUM ER 500 MG PO TB24 Oral Take 1,000 mg by mouth at bedtime.    . FLUOXETINE HCL 20 MG PO CAPS Oral Take 20 mg by mouth daily.    . IBUPROFEN 600 MG PO TABS Oral Take 1 tablet (600 mg total) by mouth every 6 (six) hours as needed for pain. 30 tablet 0    BP 146/82  Pulse 100  Temp 99 F (37.2 C) (Oral)  SpO2 98%  LMP  01/22/2012 Vitals reviewed Physical Exam Physical Examination: General appearance - alert, well appearing, and in no distress Mental status - alert, oriented to person, place, and time Eyes - pupils equal and reactive, no conjunctival injection, no scleral icterus Mouth - mucous membranes moist, pharynx normal without lesions Chest - clear to auscultation, no wheezes, rales or rhonchi, symmetric air entry, chest wall tender to palpation in left anterior chest wall, no crepitus Heart - normal rate, regular rhythm, normal S1, S2, no murmurs, rubs, clicks or gallops Abdomen - soft, nontender, nondistended, no masses or organomegaly, nabs Musculoskeletal - no joint tenderness, deformity or swelling Extremities - peripheral pulses normal, no pedal edema, no clubbing or cyanosis Skin - normal coloration and turgor, no rashes Psych- normal mood and affect  ED Course  Procedures (including critical care time)   Date: 05/31/2012  Rate: 93  Rhythm: normal sinus rhythm  QRS Axis: normal  Intervals: normal  ST/T Wave abnormalities: normal  Conduction Disutrbances: none  Narrative Interpretation: unremarkable, no significant changes compared to prior EKG     Labs Reviewed - No data to display No results found.   1. Costochondritis  MDM  Pt presenting with sharp, reproducible chest wall pain just to the right of sternum- c/w costochondritis.  Chart reviewed from 2 days ago, CXR reassuring at that time.  Pt remains PERC score of 0.  Very low suspicion for ACS.  Recommended ibuprofen for discomfort, discharged with strict return precautions, she is agreeable with this plan.         Ethelda Chick, MD 06/01/12 1256

## 2012-11-21 ENCOUNTER — Emergency Department (HOSPITAL_COMMUNITY)
Admission: EM | Admit: 2012-11-21 | Discharge: 2012-11-21 | Disposition: A | Discharge status: Home / Self Care | Payer: Medicaid Other | Attending: Emergency Medicine | Admitting: Emergency Medicine

## 2012-11-21 DIAGNOSIS — G43909 Migraine, unspecified, not intractable, without status migrainosus: Secondary | ICD-10-CM | POA: Insufficient documentation

## 2012-11-21 DIAGNOSIS — H53149 Visual discomfort, unspecified: Secondary | ICD-10-CM | POA: Insufficient documentation

## 2012-11-21 DIAGNOSIS — Z79899 Other long term (current) drug therapy: Secondary | ICD-10-CM | POA: Insufficient documentation

## 2012-11-21 MED ORDER — KETOROLAC TROMETHAMINE 30 MG/ML IJ SOLN
30.0000 mg | Freq: Once | INTRAMUSCULAR | Status: AC
  Administered 2012-11-21: 30 mg via INTRAVENOUS
  Filled 2012-11-21: qty 1

## 2012-11-21 MED ORDER — SODIUM CHLORIDE 0.9 % IV SOLN
INTRAVENOUS | Status: DC
  Administered 2012-11-21: 20:00:00 via INTRAVENOUS

## 2012-11-21 MED ORDER — METOCLOPRAMIDE HCL 5 MG/ML IJ SOLN
10.0000 mg | Freq: Once | INTRAMUSCULAR | Status: AC
  Administered 2012-11-21: 10 mg via INTRAVENOUS
  Filled 2012-11-21: qty 2

## 2012-11-21 MED ORDER — DIPHENHYDRAMINE HCL 50 MG/ML IJ SOLN
25.0000 mg | Freq: Once | INTRAMUSCULAR | Status: AC
  Administered 2012-11-21: 25 mg via INTRAVENOUS
  Filled 2012-11-21: qty 1

## 2012-11-21 NOTE — ED Notes (Signed)
Pt also complaining of some chest pain that just started while pt was in waiting room.  Pt in nad, no SOB.

## 2012-11-21 NOTE — ED Provider Notes (Signed)
History     CSN: [id number]  Arrival date & time 11/21/12  1400   First MD Initiated Contact with Patient 11/21/12 1413      Chief Complaint  Patient presents with  . Migraine  . Blurred Vision    (Consider location/radiation/quality/duration/timing/severity/associated sxs/prior treatment) HPI Comments: This is a 21 year old female, who presents emergency department with chief complaint of headache. Patient states that the headache began last night, and came on gradually. It has worsened over the course of the day. Her symptoms are aggravated by noise and light, nothing makes her symptoms better. Patient states that she has taken Tylenol with no relief. Patient states that her pain is severe. She also endorses blurred vision. She denies chest pain, shortness of breath, nausea, vomiting, diarrhea, constipation, numbness and tingling of the extremities.  The history is provided by the patient. No language interpreter was used.    No past medical history on file.  Past Surgical History  Procedure Date  . Knee surgery     No family history on file.  History  Substance Use Topics  . Smoking status: Never Smoker   . Smokeless tobacco: Not on file  . Alcohol Use: No    OB History    Grav Para Term Preterm Abortions TAB SAB Ect Mult Living                  Review of Systems  All other systems reviewed and are negative.    Allergies  Review of patient's allergies indicates no known allergies.  Home Medications   Current Outpatient Rx  Name  Route  Sig  Dispense  Refill  . ACETAMINOPHEN-CODEINE #3 300-30 MG PO TABS   Oral   Take 1 tablet by mouth every 4 (four) hours as needed. For pain.         Marland Kitchen CALCIUM + D PO   Oral   Take 1 tablet by mouth every evening.         Marland Kitchen VITAMIN D PO   Oral   Take 1 tablet by mouth daily.          Marland Kitchen DIVALPROEX SODIUM ER 500 MG PO TB24   Oral   Take 1,000 mg by mouth at bedtime.         . FLUOXETINE HCL 20 MG PO  CAPS   Oral   Take 20 mg by mouth daily.           BP 129/84  Pulse 84  Temp 98.2 F (36.8 C) (Oral)  Ht 5\' 2"  (1.575 m)  Wt 236 lb (107.049 kg)  BMI 43.17 kg/m2  SpO2 99%  LMP 09/27/2012  Physical Exam  Nursing note and vitals reviewed. Constitutional: She is oriented to person, place, and time. She appears well-developed and well-nourished.  HENT:  Head: Normocephalic and atraumatic.  Right Ear: External ear normal.  Left Ear: External ear normal.       Non-tender over temporal artery, no increased pain with chewing.  Eyes: Conjunctivae normal and EOM are normal. Pupils are equal, round, and reactive to light.       No papilledema  Neck: Normal range of motion. Neck supple.       No pain with neck flexion, no meningismus  Cardiovascular: Normal rate, regular rhythm and normal heart sounds.  Exam reveals no gallop and no friction rub.   No murmur heard. Pulmonary/Chest: Effort normal and breath sounds normal. No respiratory distress. She has no wheezes. She has no  rales. She exhibits no tenderness.  Abdominal: Soft. Bowel sounds are normal. She exhibits no distension and no mass. There is no tenderness. There is no rebound and no guarding.  Musculoskeletal: Normal range of motion. She exhibits no edema and no tenderness.       Normal gait.  Neurological: She is alert and oriented to person, place, and time.       CN 3-12 intact, sensation and strength intact bilaterally.  Skin: Skin is warm and dry.  Psychiatric: She has a normal mood and affect. Her behavior is normal. Judgment and thought content normal.    ED Course  Procedures (including critical care time)      1. Migraine       MDM   21 year old female with migraine headache. Will give migraine cocktail.  ~ 8:30pm Patient states pain is improving with treatment, currently 7/10.  9:39 PM Patient now states that her pain is minimal. She feels ready to go home. Return precautions have been given.  Patient encouraged to followup with primary care provider. She is stable and ready for discharge. She understands and agrees with the plan.       Roxy Horseman, PA-C 11/21/12 2139

## 2012-11-21 NOTE — ED Notes (Signed)
Head hurting real bad, blurred vision. Took tylenol without relief.

## 2012-11-22 NOTE — ED Provider Notes (Signed)
Medical screening examination/treatment/procedure(s) were performed by non-physician practitioner and as supervising physician I was immediately available for consultation/collaboration.  Flint Melter, MD 11/22/12 1047

## 2013-06-04 ENCOUNTER — Emergency Department (HOSPITAL_COMMUNITY): Payer: Medicaid Other

## 2013-06-04 ENCOUNTER — Emergency Department (HOSPITAL_COMMUNITY)
Admission: EM | Admit: 2013-06-04 | Discharge: 2013-06-04 | Disposition: A | Discharge status: Home / Self Care | Payer: Medicaid Other | Attending: Emergency Medicine | Admitting: Emergency Medicine

## 2013-06-04 ENCOUNTER — Encounter (HOSPITAL_COMMUNITY): Payer: Self-pay | Admitting: Emergency Medicine

## 2013-06-04 DIAGNOSIS — R509 Fever, unspecified: Secondary | ICD-10-CM | POA: Insufficient documentation

## 2013-06-04 DIAGNOSIS — Z79899 Other long term (current) drug therapy: Secondary | ICD-10-CM | POA: Insufficient documentation

## 2013-06-04 DIAGNOSIS — E669 Obesity, unspecified: Secondary | ICD-10-CM | POA: Insufficient documentation

## 2013-06-04 DIAGNOSIS — R599 Enlarged lymph nodes, unspecified: Secondary | ICD-10-CM | POA: Insufficient documentation

## 2013-06-04 DIAGNOSIS — J029 Acute pharyngitis, unspecified: Secondary | ICD-10-CM | POA: Insufficient documentation

## 2013-06-04 DIAGNOSIS — R059 Cough, unspecified: Secondary | ICD-10-CM | POA: Insufficient documentation

## 2013-06-04 DIAGNOSIS — R000 Tachycardia, unspecified: Secondary | ICD-10-CM | POA: Insufficient documentation

## 2013-06-04 DIAGNOSIS — R05 Cough: Secondary | ICD-10-CM | POA: Insufficient documentation

## 2013-06-04 LAB — CBC WITH DIFFERENTIAL/PLATELET
Basophils Absolute: 0 10*3/uL (ref 0.0–0.1)
Basophils Relative: 0 % (ref 0–1)
Hemoglobin: 13.5 g/dL (ref 12.0–15.0)
MCHC: 32.5 g/dL (ref 30.0–36.0)
Monocytes Relative: 2 % — ABNORMAL LOW (ref 3–12)
Neutro Abs: 13.4 10*3/uL — ABNORMAL HIGH (ref 1.7–7.7)
Neutrophils Relative %: 88 % — ABNORMAL HIGH (ref 43–77)
Platelets: 305 10*3/uL (ref 150–400)

## 2013-06-04 LAB — POCT I-STAT, CHEM 8
Chloride: 105 mEq/L (ref 96–112)
HCT: 43 % (ref 36.0–46.0)
Potassium: 3.9 mEq/L (ref 3.5–5.1)
Sodium: 140 mEq/L (ref 135–145)

## 2013-06-04 IMAGING — CT CT NECK W/ CM
2 series · 10 of 14 positions shown, 12 images · IV contrast (OMNIPAQUE 300)
Comparison: None.

CLINICAL DATA: Sore throat and dysphasia.  Neck pain 3 days.

CT NECK WITH CONTRAST
TECHNIQUE: Multidetector CT imaging of the neck was performed with
intravenous contrast.
Contrast: 100mL OMNIPAQUE IOHEXOL 300 MG/ML  SOLN

[Series 2: neck with st · axial · 0.39mm/px · z∈[-276,-140]mm · 5 of 103 slices shown]
[im 18/103  bone]
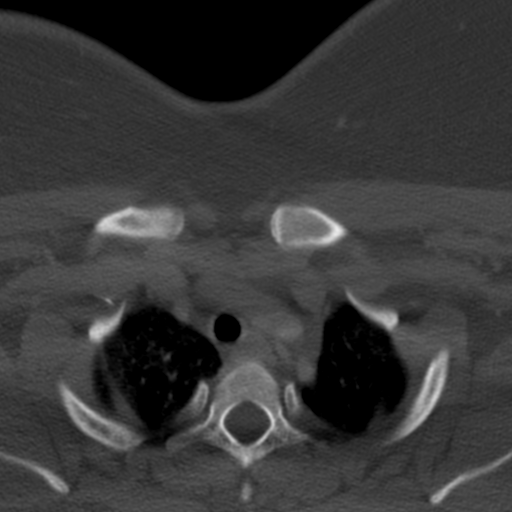
[im 35/103  bone]
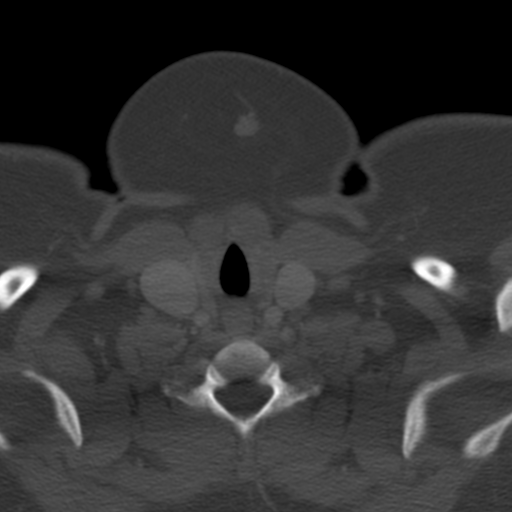
[im 52/103  bone]
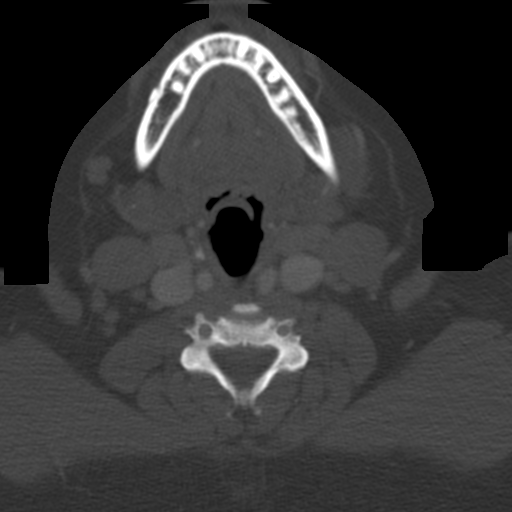
[im 69/103  bone]
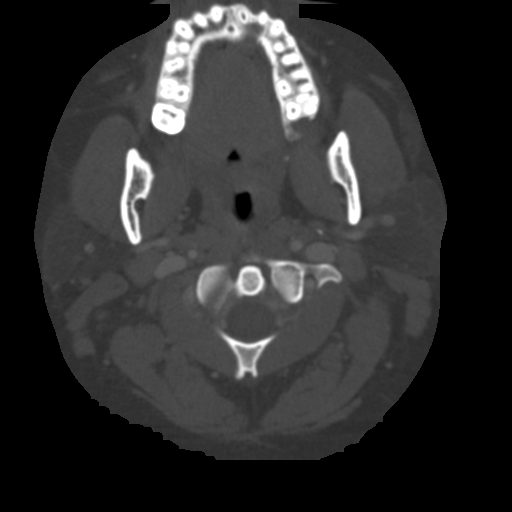
[im 86/103  bone]
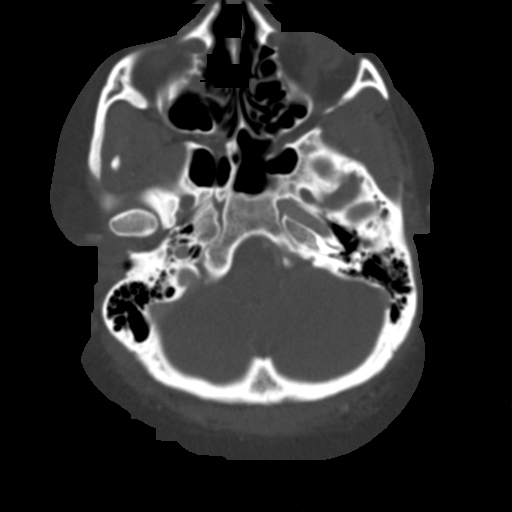

[Series 7: axial recons · axial · 0.39mm/px · z∈[-310,-178]mm · 5 of 106 slices shown, 7 images]
[im 18/106  soft-tissue]
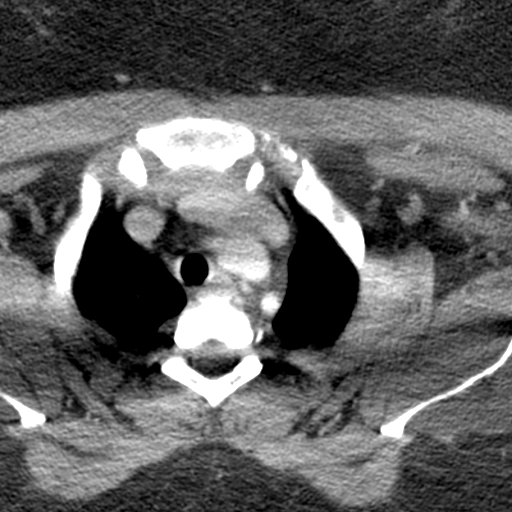
[im 18/106  bone]
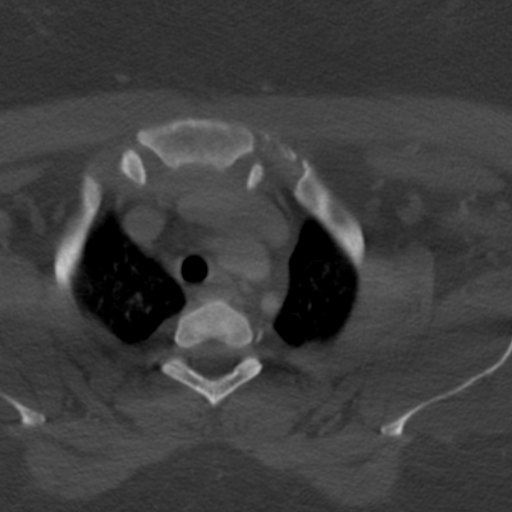
[im 36/106  bone]
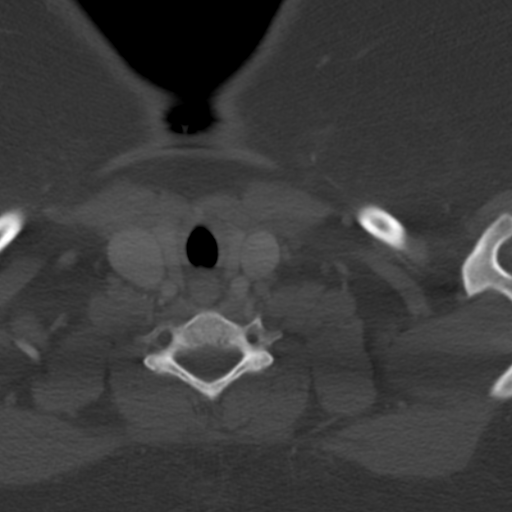
[im 53/106  bone]
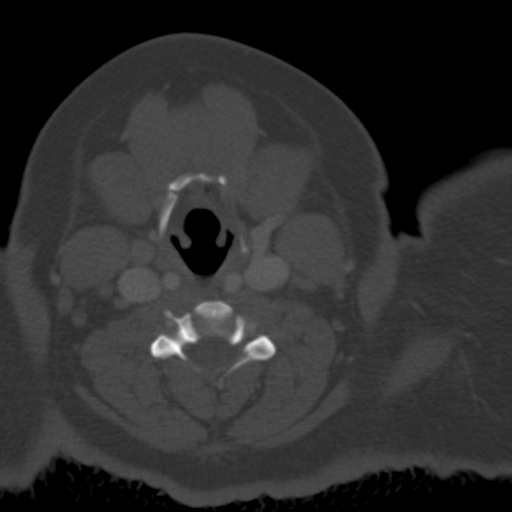
[im 71/106  bone]
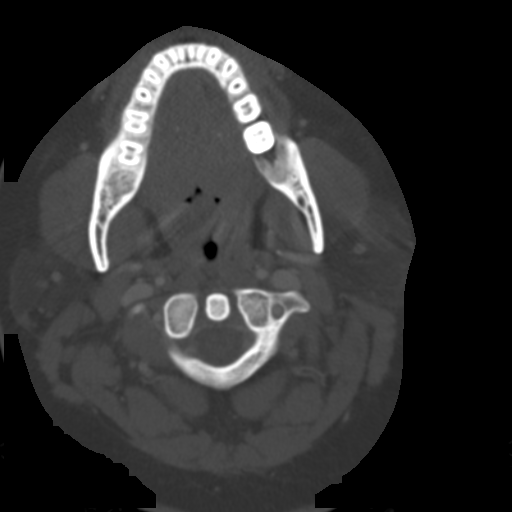
[im 88/106  soft-tissue]
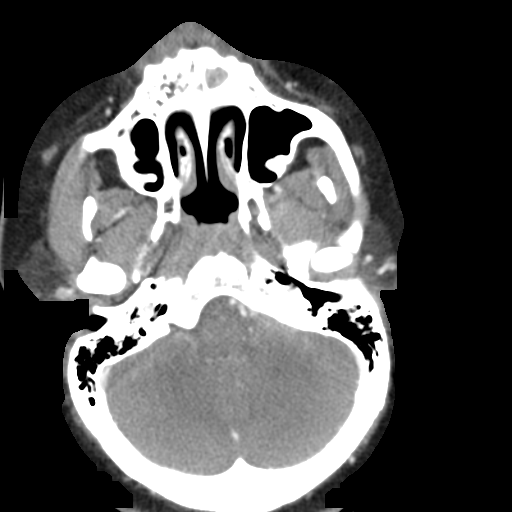
[im 88/106  bone]
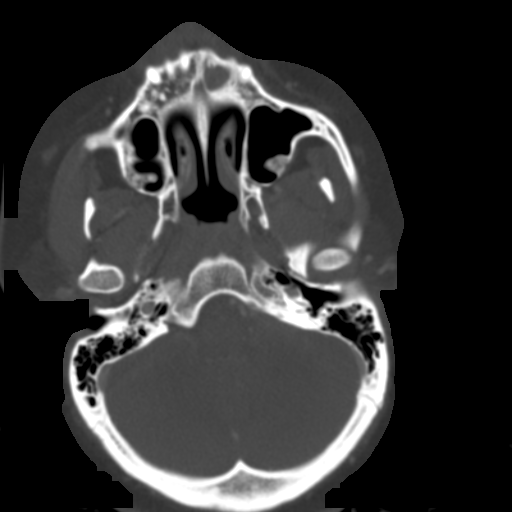

[10 of 14 positions shown; findings below may reference images not displayed]

FINDINGS: Mild hypertrophy of the palatine and nasopharyngeal
adenoidal tissue.  No tonsillar abscess.  No peritonsillar abscess.
Parapharyngeal fat preserved throughout.  No retropharyngeal soft
tissue swelling.  No osseous findings.  Airway midline.  Normal
larynx.  Unremarkable major and minor salivary glands.

Bilateral level II and level III lymphadenopathy may be reactive
although a similar picture can be seen in infectious mononucleosis.
Level II level II adenopathy is fairly bulky with a left IIA node
measuring 16 x 21 mm cross-section.

No evidence for suppurative adenitis or neck abscess.  No evidence
for eustachian tube dysfunction.  Clear mastoids.  No acute sinus
disease.  Negative orbits.  Visualized intracranial compartment
negative.  No osseous findings.  Lung apices clear.  No visible
mediastinal lymph nodes.
IMPRESSION: Prominence of the palatine and nasopharyngeal tonsils, likely
inflammatory in nature.  No evidence for tonsillar or peritonsillar
abscess.

Bulky level II and III lymphadenopathy, likely reactive.  See
discussion above.

No evidence for retropharyngeal abscess.

## 2013-06-04 MED ORDER — SODIUM CHLORIDE 0.9 % IV SOLN
3.0000 g | Freq: Once | INTRAVENOUS | Status: AC
  Administered 2013-06-04: 3 g via INTRAVENOUS
  Filled 2013-06-04: qty 3

## 2013-06-04 MED ORDER — AMOXICILLIN-POT CLAVULANATE 875-125 MG PO TABS: 1.0000 | ORAL_TABLET | Freq: Two times a day (BID) | ORAL | Status: DC

## 2013-06-04 MED ORDER — DEXAMETHASONE SODIUM PHOSPHATE 4 MG/ML IJ SOLN
4.0000 mg | Freq: Once | INTRAMUSCULAR | Status: AC
  Administered 2013-06-04: 4 mg via INTRAVENOUS
  Filled 2013-06-04: qty 1

## 2013-06-04 MED ORDER — HYDROMORPHONE HCL PF 1 MG/ML IJ SOLN
0.5000 mg | Freq: Once | INTRAMUSCULAR | Status: AC
  Administered 2013-06-04: 0.5 mg via INTRAVENOUS
  Filled 2013-06-04: qty 1

## 2013-06-04 MED ORDER — HYDROCODONE-ACETAMINOPHEN 7.5-325 MG/15ML PO SOLN: 15.0000 mL | Freq: Four times a day (QID) | ORAL | Status: DC | PRN

## 2013-06-04 MED ORDER — IOHEXOL 300 MG/ML  SOLN
100.0000 mL | Freq: Once | INTRAMUSCULAR | Status: AC | PRN
  Administered 2013-06-04: 100 mL via INTRAVENOUS

## 2013-06-04 MED ORDER — SODIUM CHLORIDE 0.9 % IV BOLUS (SEPSIS)
500.0000 mL | Freq: Once | INTRAVENOUS | Status: AC
  Administered 2013-06-04: 500 mL via INTRAVENOUS

## 2013-06-04 NOTE — ED Provider Notes (Signed)
History    CSN: [id number] Arrival date & time 06/04/13  7829  First MD Initiated Contact with Patient 06/04/13 [phone]     Chief Complaint  Patient presents with  . Sore Throat   (Consider location/radiation/quality/duration/timing/severity/associated sxs/prior Treatment) Patient is a 22 y.o. female presenting with pharyngitis. The history is provided by the patient.  Sore Throat This is a new problem. Pertinent negatives include no chest pain, no abdominal pain, no headaches and no shortness of breath.   patient's had a sore throat for the last few days. Worse with swallowing. She states she cannot drinking liquids, however she is handling her own secretions. Patient has a fever here with patient did not know she had one. She states she's also coughed up some brown sputum. She denies possibility of pregnancy. No difficulty breathing. No sick contacts. History reviewed. No pertinent past medical history. Past Surgical History  Procedure Laterality Date  . Knee surgery     No family history on file. History  Substance Use Topics  . Smoking status: Never Smoker   . Smokeless tobacco: Not on file  . Alcohol Use: No   OB History   Grav Para Term Preterm Abortions TAB SAB Ect Mult Living                 Review of Systems  Constitutional: Positive for chills and appetite change. Negative for activity change.  HENT: Positive for sore throat and voice change. Negative for neck stiffness.   Eyes: Negative for pain.  Respiratory: Positive for cough. Negative for chest tightness and shortness of breath.   Cardiovascular: Negative for chest pain and leg swelling.  Gastrointestinal: Negative for nausea, vomiting, abdominal pain and diarrhea.  Genitourinary: Negative for flank pain.  Musculoskeletal: Negative for back pain.  Skin: Negative for rash.  Neurological: Negative for weakness, numbness and headaches.  Psychiatric/Behavioral: Negative for behavioral problems.    Allergies   Review of patient's allergies indicates no known allergies.  Home Medications   Current Outpatient Rx  Name  Route  Sig  Dispense  Refill  . acetaminophen-codeine (TYLENOL #3) 300-30 MG per tablet   Oral   Take 1 tablet by mouth every 4 (four) hours as needed. For pain.         . Calcium Carbonate-Vitamin D (CALCIUM + D PO)   Oral   Take 1 tablet by mouth every evening.         . Cholecalciferol (VITAMIN D PO)   Oral   Take 1 tablet by mouth daily.          . DULoxetine HCl (CYMBALTA PO)   Oral   Take 60 mg by mouth daily.          . TRAZODONE HCL PO   Oral   Take 100 mg by mouth at bedtime.          Marland Kitchen amoxicillin-clavulanate (AUGMENTIN) 875-125 MG per tablet   Oral   Take 1 tablet by mouth 2 (two) times daily.   14 tablet   0   . HYDROcodone-acetaminophen (HYCET) 7.5-325 mg/15 ml solution   Oral   Take 15 mLs by mouth every 6 (six) hours as needed for pain.   120 mL   0    BP 116/75  Pulse 102  Temp(Src) 98.9 F (37.2 C) (Oral)  Resp 22  SpO2 95%  LMP 05/27/2013 Physical Exam  Nursing note and vitals reviewed. Constitutional: She is oriented to person, place, and time. She appears  well-developed and well-nourished.  Patient is obese  HENT:  Head: Normocephalic and atraumatic.  Mouth/Throat: Oropharyngeal exudate present.  Bilateral tonsils swollen. Some exudate. Uvula is midline. No peritonsillar asymmetric swelling. No stridor  Eyes: EOM are normal. Pupils are equal, round, and reactive to light.  Neck: Normal range of motion. Neck supple.  Cardiovascular: Regular rhythm and normal heart sounds.   No murmur heard. Mild tachycardia  Pulmonary/Chest: Effort normal and breath sounds normal. No respiratory distress. She has no wheezes. She has no rales.  Abdominal: Soft. Bowel sounds are normal. She exhibits no distension. There is no tenderness. There is no rebound and no guarding.  Musculoskeletal: Normal range of motion.  Lymphadenopathy:     She has cervical adenopathy.  Neurological: She is alert and oriented to person, place, and time. No cranial nerve deficit.  Skin: Skin is warm and dry.  Psychiatric: She has a normal mood and affect. Her speech is normal.    ED Course  Procedures (including critical care time) Labs Reviewed  CBC WITH DIFFERENTIAL - Abnormal; Notable for the following:    WBC 15.2 (*)    RBC 5.13 (*)    Neutrophils Relative % 88 (*)    Neutro Abs 13.4 (*)    Lymphocytes Relative 8 (*)    Monocytes Relative 2 (*)    All other components within normal limits  POCT I-STAT, CHEM 8 - Abnormal; Notable for the following:    Glucose, Bld 124 (*)    All other components within normal limits   Ct Soft Tissue Neck W Contrast  06/04/2013   *RADIOLOGY REPORT*  Clinical Data: Sore throat and dysphasia.  Neck pain 3 days.  CT NECK WITH CONTRAST  Technique:  Multidetector CT imaging of the neck was performed with intravenous contrast.  Contrast: OMNIPAQUE IOHEXOL 300 MG/ML  SOLN  Comparison: None.  Findings: Mild hypertrophy of the palatine and nasopharyngeal adenoidal tissue.  No tonsillar abscess.  No peritonsillar abscess. Parapharyngeal fat preserved throughout.  No retropharyngeal soft tissue swelling.  No osseous findings.  Airway midline.  Normal larynx.  Unremarkable major and minor salivary glands.  Bilateral level II and level III lymphadenopathy may be reactive although a similar picture can be seen in infectious mononucleosis. Level II level II adenopathy is fairly bulky with a left IIA node measuring 16 x 21 mm cross-section.  No evidence for suppurative adenitis or neck abscess.  No evidence for eustachian tube dysfunction.  Clear mastoids.  No acute sinus disease.  Negative orbits.  Visualized intracranial compartment negative.  No osseous findings.  Lung apices clear.  No visible mediastinal lymph nodes.  IMPRESSION: Prominence of the palatine and nasopharyngeal tonsils, likely inflammatory in nature.  No  evidence for tonsillar or peritonsillar abscess.  Bulky level II and III lymphadenopathy, likely reactive.  See discussion above.  No evidence for retropharyngeal abscess.   Original Report Authenticated By: Davonna Belling, M.D.   1. Pharyngitis     MDM  Patient with pharyngitis. Patient continued pain despite Dilaudid. CT scan was done to rule out peritonsillar abscess. It did not show an abscess. Patient feels somewhat better. She'll be discharged home. She'll follow with ENT as needed.  Juliet Rude. Rubin Payor, MD 06/04/13 [phone]

## 2013-06-04 NOTE — ED Notes (Signed)
Pt states that since the weekend, she has had a sore throat.  States that it is hard for her to swallow however patient is able to maintain her secretions.  [name] voice is affected by the swelling in her throat.  Pt states that she is having trouble breathing.  Pt able to complete sentences.  Pt also c/o rt ear pain.

## 2013-08-03 ENCOUNTER — Encounter (HOSPITAL_COMMUNITY): Payer: Self-pay | Admitting: *Deleted

## 2013-08-03 ENCOUNTER — Inpatient Hospital Stay (HOSPITAL_COMMUNITY)
Admission: AD | Admit: 2013-08-03 | Discharge: 2013-08-08 | DRG: 885 | Disposition: A | Discharge status: Home / Self Care | Payer: Medicaid Other | Source: Intra-hospital | Attending: Psychiatry | Admitting: Psychiatry

## 2013-08-03 ENCOUNTER — Emergency Department (HOSPITAL_COMMUNITY)
Admission: EM | Admit: 2013-08-03 | Discharge: 2013-08-03 | Disposition: A | Discharge status: Other Acute Inpatient Hospital | Payer: Medicaid Other | Attending: Emergency Medicine | Admitting: Emergency Medicine

## 2013-08-03 DIAGNOSIS — Z79899 Other long term (current) drug therapy: Secondary | ICD-10-CM

## 2013-08-03 DIAGNOSIS — R45851 Suicidal ideations: Secondary | ICD-10-CM

## 2013-08-03 DIAGNOSIS — N390 Urinary tract infection, site not specified: Secondary | ICD-10-CM | POA: Insufficient documentation

## 2013-08-03 DIAGNOSIS — F329 Major depressive disorder, single episode, unspecified: Secondary | ICD-10-CM

## 2013-08-03 DIAGNOSIS — F332 Major depressive disorder, recurrent severe without psychotic features: Principal | ICD-10-CM | POA: Diagnosis present

## 2013-08-03 DIAGNOSIS — F339 Major depressive disorder, recurrent, unspecified: Secondary | ICD-10-CM

## 2013-08-03 DIAGNOSIS — Z3202 Encounter for pregnancy test, result negative: Secondary | ICD-10-CM | POA: Insufficient documentation

## 2013-08-03 DIAGNOSIS — G4730 Sleep apnea, unspecified: Secondary | ICD-10-CM | POA: Diagnosis present

## 2013-08-03 DIAGNOSIS — R45850 Homicidal ideations: Secondary | ICD-10-CM | POA: Insufficient documentation

## 2013-08-03 DIAGNOSIS — F411 Generalized anxiety disorder: Secondary | ICD-10-CM | POA: Diagnosis present

## 2013-08-03 DIAGNOSIS — E669 Obesity, unspecified: Secondary | ICD-10-CM | POA: Insufficient documentation

## 2013-08-03 HISTORY — DX: Major depressive disorder, single episode, unspecified: F32.9

## 2013-08-03 HISTORY — DX: Mental disorder, not otherwise specified: F99

## 2013-08-03 HISTORY — DX: Depression, unspecified: F32.A

## 2013-08-03 HISTORY — DX: Sleep apnea, unspecified: G47.30

## 2013-08-03 LAB — CBC
Hemoglobin: 14.6 g/dL (ref 12.0–15.0)
MCH: 27 pg (ref 26.0–34.0)
MCHC: 33.3 g/dL (ref 30.0–36.0)
MCV: 81 fL (ref 78.0–100.0)

## 2013-08-03 LAB — URINALYSIS, ROUTINE W REFLEX MICROSCOPIC
Glucose, UA: NEGATIVE mg/dL
Ketones, ur: NEGATIVE mg/dL
Nitrite: NEGATIVE
Protein, ur: NEGATIVE mg/dL
Urobilinogen, UA: 0.2 mg/dL (ref 0.0–1.0)

## 2013-08-03 LAB — COMPREHENSIVE METABOLIC PANEL
ALT: 30 U/L (ref 0–35)
BUN: 9 mg/dL (ref 6–23)
Calcium: 9.8 mg/dL (ref 8.4–10.5)
Creatinine, Ser: 0.68 mg/dL (ref 0.50–1.10)
GFR calc non Af Amer: >90 mL/min (ref >90)
Glucose, Bld: 120 mg/dL — ABNORMAL HIGH (ref 70–99)
Sodium: 136 mEq/L (ref 135–145)
Total Bilirubin: 0.3 mg/dL (ref 0.3–1.2)
Total Protein: 8 g/dL (ref 6.0–8.3)

## 2013-08-03 LAB — RAPID URINE DRUG SCREEN, HOSP PERFORMED
Amphetamines: NOT DETECTED
Barbiturates: NOT DETECTED
Benzodiazepines: NOT DETECTED
Tetrahydrocannabinol: NOT DETECTED

## 2013-08-03 LAB — URINE MICROSCOPIC-ADD ON

## 2013-08-03 MED ORDER — TRAZODONE HCL 100 MG PO TABS: 100.0000 mg | ORAL_TABLET | Freq: Every day | ORAL | Status: DC

## 2013-08-03 MED ORDER — TRAZODONE HCL 100 MG PO TABS
100.0000 mg | ORAL_TABLET | Freq: Every day | ORAL | Status: DC
  Administered 2013-08-03 – 2013-08-04 (×2): 100 mg via ORAL
  Filled 2013-08-03 (×5): qty 1

## 2013-08-03 MED ORDER — ONDANSETRON HCL 4 MG PO TABS: 4.0000 mg | ORAL_TABLET | Freq: Three times a day (TID) | ORAL | Status: DC | PRN

## 2013-08-03 MED ORDER — ALUM & MAG HYDROXIDE-SIMETH 200-200-20 MG/5ML PO SUSP: 30.0000 mL | ORAL | Status: DC | PRN

## 2013-08-03 MED ORDER — IBUPROFEN 600 MG PO TABS
600.0000 mg | ORAL_TABLET | Freq: Three times a day (TID) | ORAL | Status: DC | PRN
  Administered 2013-08-03 – 2013-08-08 (×4): 600 mg via ORAL
  Filled 2013-08-03 (×4): qty 1

## 2013-08-03 MED ORDER — CEPHALEXIN 250 MG PO CAPS
250.0000 mg | ORAL_CAPSULE | Freq: Four times a day (QID) | ORAL | Status: DC
  Administered 2013-08-03 – 2013-08-08 (×18): 250 mg via ORAL
  Filled 2013-08-03: qty 10
  Filled 2013-08-03 (×8): qty 1
  Filled 2013-08-03: qty 10
  Filled 2013-08-03 (×4): qty 1
  Filled 2013-08-03: qty 10
  Filled 2013-08-03: qty 1
  Filled 2013-08-03: qty 10
  Filled 2013-08-03: qty 1
  Filled 2013-08-03: qty 10
  Filled 2013-08-03 (×3): qty 1
  Filled 2013-08-03: qty 10
  Filled 2013-08-03 (×2): qty 1
  Filled 2013-08-03: qty 10
  Filled 2013-08-03 (×3): qty 1
  Filled 2013-08-03: qty 10
  Filled 2013-08-03: qty 1
  Filled 2013-08-03 (×4): qty 10
  Filled 2013-08-03 (×3): qty 1

## 2013-08-03 MED ORDER — NICOTINE 21 MG/24HR TD PT24: 21.0000 mg | MEDICATED_PATCH | Freq: Every day | TRANSDERMAL | Status: DC

## 2013-08-03 MED ORDER — DULOXETINE HCL 60 MG PO CPEP: 60.0000 mg | ORAL_CAPSULE | Freq: Every day | ORAL | Status: DC

## 2013-08-03 MED ORDER — DULOXETINE HCL 60 MG PO CPEP
60.0000 mg | ORAL_CAPSULE | Freq: Every day | ORAL | Status: DC
  Administered 2013-08-04 – 2013-08-08 (×5): 60 mg via ORAL
  Filled 2013-08-03: qty 3
  Filled 2013-08-03 (×3): qty 1
  Filled 2013-08-03: qty 3
  Filled 2013-08-03: qty 1
  Filled 2013-08-03: qty 3
  Filled 2013-08-03 (×2): qty 1

## 2013-08-03 MED ORDER — NICOTINE 21 MG/24HR TD PT24
21.0000 mg | MEDICATED_PATCH | Freq: Every day | TRANSDERMAL | Status: DC
  Filled 2013-08-03 (×8): qty 1

## 2013-08-03 MED ORDER — CEPHALEXIN 500 MG PO CAPS: 500.0000 mg | ORAL_CAPSULE | Freq: Four times a day (QID) | ORAL | Status: DC

## 2013-08-03 MED ORDER — ZOLPIDEM TARTRATE 5 MG PO TABS: 5.0000 mg | ORAL_TABLET | Freq: Every evening | ORAL | Status: DC | PRN

## 2013-08-03 MED ORDER — MAGNESIUM HYDROXIDE 400 MG/5ML PO SUSP: 30.0000 mL | Freq: Every day | ORAL | Status: DC | PRN

## 2013-08-03 MED ORDER — IBUPROFEN 200 MG PO TABS: 600.0000 mg | ORAL_TABLET | Freq: Three times a day (TID) | ORAL | Status: DC | PRN

## 2013-08-03 MED ORDER — CEPHALEXIN 250 MG PO CAPS
250.0000 mg | ORAL_CAPSULE | Freq: Four times a day (QID) | ORAL | Status: DC
  Administered 2013-08-03: 250 mg via ORAL
  Filled 2013-08-03 (×4): qty 1

## 2013-08-03 MED ORDER — ACETAMINOPHEN 325 MG PO TABS: 650.0000 mg | ORAL_TABLET | ORAL | Status: DC | PRN

## 2013-08-03 MED ORDER — LORAZEPAM 1 MG PO TABS: 1.0000 mg | ORAL_TABLET | Freq: Three times a day (TID) | ORAL | Status: DC | PRN

## 2013-08-03 NOTE — BHH Counselor (Signed)
Jamison NP accepted pt to [facility] and pt will go to services of Dr. Elsie Saas 507-1 and Vesta Mixer to transport per [facility] who is with pt at bedside.    Nurse to call report [phone] and have pt transported at 1330 to [facility]  Send paper work (voluntary form, Catering manager)  Catha Nottingham NP is putting pt up for d/c or transfer to [facility]

## 2013-08-03 NOTE — Progress Notes (Signed)
22 year old female pt admitted on involuntary basis. Pt, on admission, reports that she was brought in by her teacher as she told her teacher that she's been feeling depressed and suicidal. Pt reports that she has been having conflict with her godmother and they have been arguing and she feels godmother is disrespectful to her. Pt says that her brother's baby Cathren Harsh says she can live with her. Pt also states that she feels her medications are not working and may need an adjustment. Pt reports that she goes to [facility] for med management. Pt is able to contract for safety on the unit. Pt was oriented to the unit and safety maintained.

## 2013-08-03 NOTE — ED Provider Notes (Signed)
CSN: [id number]     Arrival date & time 08/03/13  1032 History   First MD Initiated Contact with Patient 08/03/13 1054     Chief Complaint  Patient presents with  . Medical Clearance   (Consider location/radiation/quality/duration/timing/severity/associated sxs/prior Treatment) HPI Comments: Patient is a 22 year old female Who presents today for medical clearance. She reports that she has been seeing her psychiatrist and had a "breakdown today. She has been on trazodone and Celexa since February. Over the past 2 months she has had increased stressors at her home. Today she feels as though it would be better off if "she just wasn't here anymore" she has had a history of prior suicide attempts in the past. She tried to overdose in the past. This is what she would do this time. She also reports that she feels like hurting other people. She denies any drug or alcohol use. She states she physically feels well. Specifically she denies any dysuria, urinary urgency, urinary frequency, abdominal pain, fever, chills.  The history is provided by the patient. No language interpreter was used.    No past medical history on file. Past Surgical History  Procedure Laterality Date  . Knee surgery     No family history on file. History  Substance Use Topics  . Smoking status: Never Smoker   . Smokeless tobacco: Not on file  . Alcohol Use: No   OB History   Grav Para Term Preterm Abortions TAB SAB Ect Mult Living                 Review of Systems  Constitutional: Negative for fever and chills.  Respiratory: Negative for shortness of breath.   Gastrointestinal: Negative for nausea, vomiting and abdominal pain.  Genitourinary: Negative for dysuria, urgency and frequency.  Psychiatric/Behavioral: Positive for suicidal ideas.  All other systems reviewed and are negative.    Allergies  Review of patient's allergies indicates no known allergies.  Home Medications   Current Outpatient Rx  Name   Route  Sig  Dispense  Refill  . acetaminophen-codeine (TYLENOL #3) 300-30 MG per tablet   Oral   Take 1 tablet by mouth every 4 (four) hours as needed. For pain.         . DULoxetine (CYMBALTA) 60 MG capsule   Oral   Take 60 mg by mouth daily.         . traZODone (DESYREL) 100 MG tablet   Oral   Take 100 mg by mouth at bedtime.          BP 132/74  Pulse 95  Temp(Src) 97.3 F (36.3 C) (Oral)  Resp 17  SpO2 97% Physical Exam  Nursing note and vitals reviewed. Constitutional: She is oriented to person, place, and time. She appears well-developed and well-nourished. She does not appear ill. No distress.  Obese  HENT:  Head: Normocephalic and atraumatic.  Right Ear: External ear normal.  Left Ear: External ear normal.  Nose: Nose normal.  Mouth/Throat: Oropharynx is clear and moist.  Eyes: Conjunctivae are normal.  Neck: Normal range of motion.  Cardiovascular: Normal rate, regular rhythm and normal heart sounds.   Pulmonary/Chest: Effort normal and breath sounds normal. No stridor. No respiratory distress. She has no wheezes. She has no rales.  Abdominal: Soft. She exhibits no distension. There is no tenderness.  Musculoskeletal: Normal range of motion.  Neurological: She is alert and oriented to person, place, and time. She has normal strength.  Skin: Skin is warm and dry.  She is not diaphoretic. No erythema.  Psychiatric: She has a normal mood and affect. Her behavior is normal. She expresses homicidal and suicidal ideation. She expresses suicidal plans.    ED Course  Procedures (including critical care time) Labs Review Labs Reviewed  CBC - Abnormal; Notable for the following:    RBC 5.41 (*)    All other components within normal limits  COMPREHENSIVE METABOLIC PANEL - Abnormal; Notable for the following:    Glucose, Bld 120 (*)    All other components within normal limits  SALICYLATE LEVEL - Abnormal; Notable for the following:    Salicylate Lvl <2.0 (*)     All other components within normal limits  URINALYSIS, ROUTINE W REFLEX MICROSCOPIC - Abnormal; Notable for the following:    APPearance CLOUDY (*)    Leukocytes, UA LARGE (*)    All other components within normal limits  ACETAMINOPHEN LEVEL  ETHANOL  URINE RAPID DRUG SCREEN (HOSP PERFORMED)  URINE MICROSCOPIC-ADD ON  POCT PREGNANCY, URINE   Imaging Review No results found.  MDM  No diagnosis found.  Patient is a 22 year old female with suicidal ideation and plan with prior suicide attempt the past. She is currently awaiting placement. ACT team has evaluated patient. She is noted to have a urinary tract infection. I will give her Keflex for this infection. She is otherwise medically cleared.    Mora Bellman, PA-C 08/03/13 1232

## 2013-08-03 NOTE — ED Notes (Signed)
Pt was sent over from [facility] for medical clearence pt going through family problems pt is depressed and states that she will take pills or cut herself to harm herself.
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
## 2013-08-03 NOTE — Tx Team (Signed)
Initial Interdisciplinary Treatment Plan  PATIENT STRENGTHS: (choose at least two) Ability for insight Average or above average intelligence General fund of knowledge Motivation for treatment/growth  PATIENT STRESSORS: Marital or family conflict   PROBLEM LIST: Problem List/Patient Goals Date to be addressed Date deferred Reason deferred Estimated date of resolution  Depression 08/03/13     Suicidal Ideation 08/03/13                                                DISCHARGE CRITERIA:  Ability to meet basic life and health needs Improved stabilization in mood, thinking, and/or behavior Verbal commitment to aftercare and medication compliance  PRELIMINARY DISCHARGE PLAN: Attend aftercare/continuing care group Placement in alternative living arrangements  PATIENT/FAMIILY INVOLVEMENT: This treatment plan has been presented to and reviewed with the patient, Alexandra Gray, and/or family member, .  The patient and family have been given the opportunity to ask questions and make suggestions.  Alexandra Gray, [place] 08/03/2013, 2:19 PM

## 2013-08-03 NOTE — Progress Notes (Signed)
Alexandra Gray is seen OOB UAL on the 500 hall...she interacts well with her peers. She is adjusting to the environment and the milieu.    A She attends her dinner meal in the cafe', with the other patients.  [facility] contracts for safety with this Clinical research associate.     R Safety is in place and pOC moves forward.

## 2013-08-03 NOTE — Progress Notes (Signed)
Writer has observed patient up in the dayroom watching tv and interacting with peers. Patient plans to attend group this evening and received ibuprofen earlier for knee pain. Patient reports some relief and heat pack given also. Patient reports feeling overwhelmed with life situations and was in need of inpatient. Patient currently denies si/hi/a/v hallucinations. Support and encouragement given, safety maintained on unit, will continue to monitor.
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
## 2013-08-04 DIAGNOSIS — F411 Generalized anxiety disorder: Secondary | ICD-10-CM

## 2013-08-04 DIAGNOSIS — R45851 Suicidal ideations: Secondary | ICD-10-CM

## 2013-08-04 DIAGNOSIS — F332 Major depressive disorder, recurrent severe without psychotic features: Secondary | ICD-10-CM | POA: Diagnosis present

## 2013-08-04 NOTE — Tx Team (Addendum)
Interdisciplinary Treatment Plan Update   Date Reviewed:  08/04/2013  Time Reviewed:  10:00 AM  Progress in Treatment:   Attending groups: Yes Participating in groups: Yes Taking medication as prescribed: Yes  Tolerating medication: Yes Family/Significant other contact made: .counselor to assess for collateral contact Patient understands diagnosis: Yes  Discussing patient identified problems/goals with staff: Yes Medical problems stabilized or resolved: Yes Denies suicidal/homicidal ideation: No, but contracts for safety Patient has not harmed self or others: Yes  For review of initial/current patient goals, please see plan of care.  Estimated Length of Stay:  3-5 days  Reasons for Continued Hospitalization:  Anxiety Depression Medication stabilization Suicidal ideation  New Problems/Goals identified:    Discharge Plan or Barriers:   Home with outpatient follow up [facility]  Additional Comments:    NA Attendees:  Patient:  08/04/2013 10:00 AM   Signature: Mervyn Gay, MD 08/04/2013 10:00 AM  Signature:  Verne Spurr, PA 08/04/2013 10:00 AM  Signature: 08/04/2013 10:00 AM  Signature:Beverly Terrilee Croak, RN 08/04/2013 10:00 AM  Signature:  Neill Loft RN 08/04/2013 10:00 AM  Signature:  Juline Patch, LCSW 08/04/2013 10:00 AM  Signature:  Reyes Ivan, LCSW 08/04/2013 10:00 AM  Signature:  Maseta Dorley,Care Coordinator 08/04/2013 10:00 AM  Signature:  08/04/2013 10:00 AM  Signature: Leighton Parody, RN 08/04/2013  10:00 AM   Signature:    Signature:      Scribe for Treatment Team:   [employer],  08/04/2013 10:00 AM
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
## 2013-08-04 NOTE — Progress Notes (Signed)
The focus of this group is to help patients review their daily goal of treatment and discuss progress on daily workbooks. Pt attended the evening group session and responded to all discussion prompts from the Writer. Pt shared that she had a good day on the unit, largely due to her peers who were constantly making her laugh. Pt reported having no additional needs from Nursing Staff this evening. [name] affect was bright and she volunteered several positive comments to her peers.

## 2013-08-04 NOTE — Progress Notes (Signed)
Adult Psychoeducational Group Note  Date:  08/04/2013 Time:  11:00am  Group Topic/Focus:  Wellness Toolbox:   The focus of this group is to discuss various aspects of wellness, balancing those aspects and exploring ways to increase the ability to experience wellness.  Patients will create a wellness toolbox for use upon discharge.  Participation Level:  Active  Participation Quality:  Appropriate and Attentive  Affect:  Appropriate  Cognitive:  Alert and Appropriate  Insight: Appropriate  Engagement in Group:  Engaged  Modes of Intervention:  Discussion and Education  Additional Comments:  Pt attended and participated in group. When ask what she did to take care of herself pt stated good hygiene. When ask what was her biggest struggle pt stated depression. Pt stated she struggles really bad with depression and that family and friends just don't understand what she goes through.  Shelly Bombard D 08/04/2013, 1:52 PM

## 2013-08-04 NOTE — Progress Notes (Signed)
D: Patient walking into the dayroom on approach.  Patient states her day was ok.  Patient states she has been depressed.  Patient states she has had no energy and states she has been feeling down today.  Patient states everything in life has been bothering her.  Pateint states, "My family wants me to kiss their ass and I am not going to do it."  Patient states she wants to learn to walk away from family when they are causing problems.  Patient states she has passive SI but verbally contracts for safety.  Patient denies HI and denies AVH. A: Staff to monitor Q 15 mins for safety.  Encouragement and support offered.  Scheduled medications administered per orders. R: Patient remains safe on the unit.  Patient attended group tonight.  Patient visible on the unit and interacting with peers.  Patient taking administered medications.

## 2013-08-04 NOTE — [facility] Suicide Risk Assessment (Signed)
Suicide Risk Assessment  Admission Assessment     Nursing information obtained from:  Patient Demographic factors:  Adolescent or young adult Current Mental Status:  Self-harm thoughts Loss Factors:  NA Historical Factors:  Prior suicide attempts;Family history of mental illness or substance abuse Risk Reduction Factors:  Living with another person, especially a relative;Positive social support  CLINICAL FACTORS:   Severe Anxiety and/or Agitation Depression:   Anhedonia Hopelessness Impulsivity Insomnia Recent sense of peace/wellbeing Severe Unstable or Poor Therapeutic Relationship Previous Psychiatric Diagnoses and Treatments  COGNITIVE FEATURES THAT CONTRIBUTE TO RISK:  Closed-mindedness Loss of executive function Polarized thinking Thought constriction (tunnel vision)    SUICIDE RISK:   Moderate:  Frequent suicidal ideation with limited intensity, and duration, some specificity in terms of plans, no associated intent, good self-control, limited dysphoria/symptomatology, some risk factors present, and identifiable protective factors, including available and accessible social support.  PLAN OF CARE: Admitted voluntarily and emergently from [facility] long emergency department for depression and suicidal ideation.  I certify that inpatient services furnished can reasonably be expected to improve the patient's condition.  Loucille Takach,[name] 08/04/2013, 2:08 PM

## 2013-08-04 NOTE — [facility] Group Notes (Signed)
[facility] LCSW Aftercare Discharge Planning Group Note   08/04/2013 10:29 AM    Participation Quality:  Appropraite  Mood/Affect:  Appropriate  Depression Rating:  6  Anxiety Rating:  4  Thoughts of Suicide:  Yes  Will you contract for safety?  Yes  Current AVH:  No  Plan for Discharge/Comments:  Patient attending discharge planning group and actively participated in group.  Patient advised she is follow up outpatient by [facility].  CSW provided all participants with daily workbook and information on services offered by [facility].   Transportation Means: Patient has transportation.   Supports:  Patient has a support system.   Sayvon Arterberry, Joesph July

## 2013-08-04 NOTE — Progress Notes (Signed)
Recreation Therapy Notes  Date: 09.08.2014 Time: 3:00pm Location: [address] Dayroom  Group Topic: Wellness  Goal Area(s) Addresses:  Patient will define components of whole wellness. Patient will verbalize benefit of whole wellness.  Behavioral Response: Engaged, Attentive, Appropriate  Intervention: Air traffic controller   Activity: [employer]. Patients were given a worksheet with the six dimensions of wellness: Physical, Social, Intellectual, Emotional, Environmental, and Spiritual. Patients were asked to identify two ways they address each dimension.   Education: Wellness, Discharge Planning   Education Outcome: Acknowledges understanding  Clinical Observations/Feedback: Patient contributed to opening discussion, helping group define each dimension of wellness. Patient completed worksheet and shared areas where she needs to invest in her wellness. Additionally patinet actively engaged in wrap up discussion, addressing components of wellness as well as importance of each area. Patient gave examples for emotional and social wellness that peers were able to easily identify with, patient contributions to wrap up discussion allowed patients to find things in common.   Alexandra Gray, LRT/CTRS  Alexandra Gray 08/04/2013 10:41 PM

## 2013-08-04 NOTE — Progress Notes (Signed)
Adult Psychoeducational Group Note  Date:  08/03/13 Time:  8:00 pm  Group Topic/Focus:  Wrap-Up Group:   The focus of this group is to help patients review their daily goal of treatment and discuss progress on daily workbooks.  Participation Level:  Active  Participation Quality:  Appropriate  Affect:  Appropriate  Cognitive:  Appropriate  Insight: Good  Engagement in Group:  Engaged  Modes of Intervention:  Discussion, Education, Socialization and Support  Additional Comments:  Pt stated that she tried to commit suicide. Pt stated that she suffers from depression. Pt stated that she is a good listener and a friendly person when asked to identify two positive character traits.   Kameron Glazebrook 08/04/2013, 1:10 AM
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
## 2013-08-04 NOTE — Progress Notes (Signed)
D:  Patient's self inventory sheet, patient sleeps fair, good appetite, low energy level, good attention span.  Rated depression #9, hopelessness and anxiety #5.  Denied withdrawals.  SI off/on, contracts for safety, no plan.  Has had right knee surgery.  Headache.  Pain goal #7, worst pain 39.  After discharge, plans to take meds, follow up appointments.  Has anxiety and gets paranoid lately.  Does have discharge plans.  No problems taking meds after discharge. A:  Medications administered per MD orders.  Emotional support and encouragement given patient. R:  Denied HI.  Denied A/V hallucinations.  SI off/on, contracts for safety, no plan.  Will continue to monitor patient for safety with 15 minute checks.  Safety maintained.

## 2013-08-05 MED ORDER — TRAZODONE HCL 100 MG PO TABS
100.0000 mg | ORAL_TABLET | Freq: Every day | ORAL | Status: DC
  Administered 2013-08-05: 100 mg via ORAL
  Filled 2013-08-05 (×2): qty 1

## 2013-08-05 MED ORDER — TRAZODONE HCL 100 MG PO TABS
100.0000 mg | ORAL_TABLET | Freq: Every day | ORAL | Status: DC
  Administered 2013-08-06 – 2013-08-07 (×2): 100 mg via ORAL
  Filled 2013-08-05: qty 1
  Filled 2013-08-05 (×2): qty 3
  Filled 2013-08-05: qty 1
  Filled 2013-08-05: qty 3

## 2013-08-05 NOTE — Progress Notes (Signed)
The focus of this group is to educate the patient on the purpose and policies of crisis stabilization and provide a format to answer questions about their admission.  The group details unit policies and expectations of patients while admitted.  Patient attended 0900 nurse education orientation group.  Patient actively participated, appropriate affect, alert, appropriate insight and engagement.  Patient will work on goals for discharge today.  

## 2013-08-05 NOTE — [facility] Suicide Risk Assessment (Deleted)
[facility] INPATIENT:  Family/Significant Other Suicide Prevention Education  Suicide Prevention Education:  Education Completed; Bryson Dames, Daughter, [phone];  has been identified by the patient as the family member/significant other with whom the patient will be residing, and identified as the person(s) who will aid the patient in the event of a mental health crisis (suicidal ideations/suicide attempt).  With written consent from the patient, the family member/significant other has been provided the following suicide prevention education, prior to the and/or following the discharge of the patient.  The suicide prevention education provided includes the following:  Suicide risk factors  Suicide prevention and interventions  National Suicide Hotline telephone number  [facility] assessment telephone number  [facility] Emergency Assistance 911  [facility] and/or Residential Mobile Crisis Unit telephone number  Request made of family/significant other to:  Remove weapons (e.g., guns, rifles, knives), all items previously/currently identified as safety concern.  Daughter advised patient does not have access to weapons.    Remove drugs/medications (over-the-counter, prescriptions, illicit drugs), all items previously/currently identified as a safety concern.  The family member/significant other verbalizes understanding of the suicide prevention education information provided.  The family member/significant other agrees to remove the items of safety concern listed above.  [name] 08/05/2013, 8:46 AM

## 2013-08-05 NOTE — Progress Notes (Signed)
Adult Psychoeducational Group Note  Date:  08/05/2013 Time:  11:00am Group Topic/Focus:  Recovery Goals:   The focus of this group is to identify appropriate goals for recovery and establish a plan to achieve them.  Participation Level:  Active  Participation Quality:  Appropriate and Attentive  Affect:  Appropriate  Cognitive:  Alert and Appropriate  Insight: Appropriate  Engagement in Group:  Engaged  Modes of Intervention:  Discussion and Education  Additional Comments:  Pt attended and partici[pated in group. When asked what recovery meant to her pt stated admit to your problem taking the first step. When asked what was one step she could take in her recovery pt stated stay on her meds get control of her anger and talk to others.  Shelly Bombard D 08/05/2013, 1:53 PM

## 2013-08-05 NOTE — [facility] Suicide Risk Assessment (Signed)
[facility] INPATIENT:  Family/Significant Other Suicide Prevention Education  Suicide Prevention Education:  Education Completed; Marcos Peloso, Mother, [phone]; has been identified by the patient as the family member/significant other with whom the patient will be residing, and identified as the person(s) who will aid the patient in the event of a mental health crisis (suicidal ideations/suicide attempt).  With written consent from the patient, the family member/significant other has been provided the following suicide prevention education, prior to the and/or following the discharge of the patient.  The suicide prevention education provided includes the following:  Suicide risk factors  Suicide prevention and interventions  National Suicide Hotline telephone number  [facility] assessment telephone number  [facility] Emergency Assistance 911  [facility] and/or Residential Mobile Crisis Unit telephone number  Request made of family/significant other to:  Remove weapons (e.g., guns, rifles, knives), all items previously/currently identified as safety concern.  Patient would not have access to guns while living with sister-in-law.  Remove drugs/medications (over-the-counter, prescriptions, illicit drugs), all items previously/currently identified as a safety concern.  The family member/significant other verbalizes understanding of the suicide prevention education information provided.  The family member/significant other agrees to remove the items of safety concern listed above.  [name] 08/05/2013, 1:02 PM

## 2013-08-05 NOTE — Progress Notes (Signed)
Recreation Therapy Notes  Date: 09.09.2014 Time: 2:45pm Location: [address] Hall Dayroom   Group Topic: Animal Assisted Activities (AAA)  Behavioral Response: Did not attend. Patient consent form indicates patient declines all AAA services during admission.  Marykay Lex Vaudine Dutan, LRT/CTRS  Dracen Reigle L 08/05/2013 5:06 PM

## 2013-08-05 NOTE — Progress Notes (Signed)
[facility] MD Progress Note  08/05/2013 2:40 PM Alexandra Gray  MRN:  [id number]  Subjective:  Patient complaint a Bard feeding drowsy and poor sleep. Patient also reported depression and anxiety but lately better since her admission. Patient stated that she didn't have Gray. Somebody killing her in her sleep. Patient rated her depression has 8/10 and anxiety 6/10 today. Patient is trying to  Adjust to the milieu therapy and attending groups unlearning coping skills. patient requested to just her medication for better sleep.  Diagnosis:   DSM5: Schizophrenia Disorders:   Obsessive-Compulsive Disorders:   Trauma-Stressor Disorders:   Substance/Addictive Disorders:   Depressive Disorders:  Major Depressive Disorder - Severe (296.23)  Axis I: Major Depression, Recurrent severe  ADL's:  Intact  Sleep: Poor  Appetite:  Fair  Suicidal Ideation:  Patient endorses suicide ideation without intention or plan. She contracts for safety in the hospital Homicidal Ideation:  denied AEB (as evidenced by):  Psychiatric Specialty Exam: ROS  Blood pressure 126/80, pulse 102, temperature 97.2 F (36.2 C), temperature source Oral, resp. rate 22, height 5' (1.524 m), weight 115.214 kg (254 lb).Body mass index is 49.61 kg/(m^2).  General Appearance: Casual and Guarded  Eye Contact::  Minimal  Speech:  Clear and Coherent  Volume:  Decreased  Mood:  Angry, Anxious, Depressed, Hopeless and Worthless  Affect:  Constricted and Depressed  Thought Process:  Goal Directed and Intact  Orientation:  Full (Time, Place, and Person)  Thought Content:  Obsessions and Rumination  Suicidal Thoughts:  Yes.  without intent/plan  Homicidal Thoughts:  No  Memory:  Immediate;   Fair  Judgement:  Impaired  Insight:  Lacking  Psychomotor Activity:  Psychomotor Retardation  Concentration:  Fair  Recall:  Good  Akathisia:  NA  Handed:  Right  AIMS (if indicated):     Assets:  Communication Skills Desire for  Improvement Leisure Time Physical Health Resilience Social Support  Sleep:  Number of Hours: 6   Current Medications: Current Facility-Administered Medications  Medication Dose Route Frequency Provider Last Rate Last Dose  . cephALEXin (KEFLEX) capsule 250 mg  250 mg Oral Q6H Alexandra Means, NP   250 mg at 08/05/13 1156  . DULoxetine (CYMBALTA) DR capsule 60 mg  60 mg Oral Daily Alexandra Means, NP   60 mg at 08/05/13 0834  . ibuprofen (ADVIL,MOTRIN) tablet 600 mg  600 mg Oral Q8H PRN Alexandra Means, NP   600 mg at 08/04/13 0831  . magnesium hydroxide (MILK OF MAGNESIA) suspension 30 mL  30 mL Oral Daily PRN Alexandra Means, NP      . nicotine (NICODERM CQ - dosed in mg/24 hours) patch 21 mg  21 mg Transdermal Daily Alexandra Means, NP      . traZODone (DESYREL) tablet 100 mg  100 mg Oral QHS Alexandra Settle, MD        Lab Results: No results found for this or any previous visit (from the past 48 hour(s)).  Physical Findings: AIMS: Facial and Oral Movements Muscles of Facial Expression: None, normal Lips and Perioral Area: None, normal Jaw: None, normal Tongue: None, normal,Extremity Movements Upper (arms, wrists, hands, fingers): None, normal Lower (legs, knees, ankles, toes): None, normal, Trunk Movements Neck, shoulders, hips: None, normal, Overall Severity Severity of abnormal movements (highest score from questions above): None, normal Incapacitation due to abnormal movements: None, normal Patient's awareness of abnormal movements (rate only patient's report): No Awareness, Dental Status Current problems with teeth and/or dentures?: No Does patient usually  wear dentures?: No  CIWA:  CIWA-Ar Total: 2 COWS:  COWS Total Score: 2  Treatment Plan Summary: Daily contact with patient to assess and evaluate symptoms and progress in treatment Medication management  Plan: Adjust trazodone 100 mg at 6 PM Continue with Cymbalta 60 mg daily Treatment Plan/Recommendations:   1. Admit  for crisis management and stabilization. 2. Medication management to reduce current symptoms to base line and improve the patient's overall level of functioning. 3. Treat health problems as indicated. 4. Develop treatment plan to decrease risk of relapse upon discharge and to reduce the need for readmission. 5. Psycho-social education regarding relapse prevention and self care. 6. Health care follow up as needed for medical problems. 7. Restart home medications where appropriate.   Medical Decision Making Problem Points:  Established problem, worsening (2), Review of last therapy session (1) and Review of psycho-social stressors (1) Data Points:  Review or order clinical lab tests (1) Review or order medicine tests (1) Review of medication regiment & side effects (2) Review of new medications or change in dosage (2)  I certify that inpatient services furnished can reasonably be expected to improve the patient's condition.   Alexandra Gray,Alexandra Gray. 08/05/2013, 2:40 PM

## 2013-08-05 NOTE — Progress Notes (Signed)
D: Patient in the hallway on approach.  Patient states she has bee  Drowsy all day.  Patient states she had learned not to let people get to her.  Patient denies SI/HI and denies AVH. A: Staff to monitor Q 15 mins for safety.  Encouragement and support offered.  Scheduled medications administered per orders. R: Patient remains safe on the unit.  Patient attended group tonight.  Patient visible on the unit and interacting with peers.  Patient cooperative and taking administered medications.
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
## 2013-08-05 NOTE — [facility] Group Notes (Addendum)
[facility] LCSW Group Therapy      Feelings About Diagnosis 1:15 - 2:30 PM         08/05/2013  3:04 PM     Type of Therapy:  Group Therapy  Participation Level:  Active  Participation Quality:  Appropriate  Affect:  Appropriate  Cognitive:  Alert and Appropriate  Insight:  Developing/Improving and Engaged  Engagement in Therapy:  Developing/Improving and Engaged  Modes of Intervention:  Discussion, Education, Exploration, Problem-Solving, Rapport Building, Support  Summary of Progress/Problems:  Patient actively participated in group. Patient discussed past and present diagnosis and the effects it has had on  life.  Patient talked about family and society being judgmental and the stigma associated with having a mental health diagnosis.  She reports feeling different and not normal compared to others.  Patient was cautioned against comparing herself to others.  [name] 08/05/2013  3:04 PM

## 2013-08-05 NOTE — Progress Notes (Addendum)
D:  Patient's self inventory sheet, patient has poor sleep, good appetite, low energy level, good attention span.  Rated depression #8, hopeless #6.  Denied withdrawals.  SI off/on, contracts for safety.  Has experienced lightheadedness and headache in past 24 hours.  Pain goal  today #7, worst pain #8.  After discharge, plans to take meds, follow up appointments.  Tends to have sensitive skin.  Does have discharge plans.  No problems taking meds after discharge. A:  Medications administered per MD orders.  Emotional support and encouragement given patient. R:  Denied HI.   Denied A/V hallucinations.  SI off/on, contracts for safety.  Will continue to monitor for safety with 15 minute checks.  Safety maintained.

## 2013-08-06 MED ORDER — CEPHALEXIN 250 MG PO CAPS: 250.0000 mg | ORAL_CAPSULE | Freq: Four times a day (QID) | ORAL | Status: DC

## 2013-08-06 MED ORDER — DULOXETINE HCL 60 MG PO CPEP: 60.0000 mg | ORAL_CAPSULE | Freq: Every day | ORAL | Status: DC

## 2013-08-06 MED ORDER — TRAZODONE HCL 100 MG PO TABS: 100.0000 mg | ORAL_TABLET | Freq: Every day | ORAL | Status: DC

## 2013-08-06 NOTE — Progress Notes (Signed)
The focus of this group is to help patients review their daily goal of treatment and discuss progress on daily workbooks. Pt attended the evening group session and responded to all discussion prompts from the Writer. Pt shared that she had a mostly good day on the unit, though she felt down earlier in the day. Pt also shared that she had enjoyed talking with her peers on the hallway, who made her smile. [name] affect was appropriate and she made several encouraging comments to other [name] in the group.

## 2013-08-06 NOTE — Progress Notes (Signed)
[facility] Adult Case Management Discharge Plan :  Will you be returning to the same living situation after discharge: No, patient to live with sister-in-law At discharge, do you have transportation home? Yes, patient to arrange transportation home. Do you have the ability to pay for your medications:Yes,  Patient has medicaid  Release of information consent forms completed and in the chart;  Patient's signature needed at discharge.  Patient to Follow up at: Follow-up Information   [facility] On 08/08/2013. (You are scheduled with [facility] on Friday, Aparil 12, 2014 at 9 AM)    Contact information:   [address] [place], [place]   [address]   [phone]      Follow up with [facility] On 08/07/2013. (Please go to [facility] walk in clinic on Thursday, August 07, 2013 or any weekday between 8AM-3PM)    Contact information:   [address] [place], [place]   [address]  [phone]      Patient denies SI/HI:   Patient no longer endorsing SI/HI or other thoughts of self harm.  Safety Planning and Suicide Prevention discussed:  .Reviewed with all patients during discharge planning group   Nayelis Bonito, Joesph July 08/06/2013, 1:17 PM

## 2013-08-06 NOTE — ED Provider Notes (Signed)
Medical screening examination/treatment/procedure(s) were performed by non-physician practitioner and as supervising physician I was immediately available for consultation/collaboration.  Yaneisy Wenz R. Siaosi Alter, MD 08/06/13 1532 

## 2013-08-06 NOTE — Progress Notes (Signed)
D: Patient appropriate and cooperative with staff and peers. Patient's affect/mood is depressed and tearful at times. She reported on the self inventory sheet that her sleep is fair, appetite and ability to pay attention are both good and energy level is low. Patient rated depression "6" and feelings of hopelessness "2". She's attending the majority of groups. Patient was to d/c today, but ordered was cancelled due to patient not having a place to go.  A: Support and encouragement provided to patient. Scheduled medications administered per MD orders. Maintain Q15 minute checks for safety.  R: Patient receptive. Passive SI, but contracts for safety. Denies HI and AVH. Patient remains safe.
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
## 2013-08-06 NOTE — Discharge Summary (Signed)
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  Mental disorder    .  Depression    .  Sleep apnea     AXIS IV: other psychosocial or environmental problems, problems related to social environment and problems with primary support group  AXIS V: 41-50 serious symptoms   Level of Care:  OP  Hospital Course:  Alexandra Gray was admitted after being evaluated at the [facility] by Dr Marlinda Mike for in patient psychiatric care and crisis management. She reported worsening depression, anxiety and suicidal intent with plan and confirms that she intents to do so.        Upon admission to the unit Alexandra Gray was evaluated and medication management was initiated. She was encouraged to participate in unit programming and to attend groups to learn coping skills, problem solving skills and stress reduction techniques.        Alexandra Gray was evaluated each day to monitor her response to treatment and progress. Improvement was noted by the patient's reports of decreasing symptoms, improved mood, sleep, appetite and participation in unit groups. Alexandra Gray did not report any  adverse reaction to medication and responded well to a therapeutic milieu.        She was soon ready to discharge home but experienced a relapse in symptoms when she was told by her father that she could not return to his home.  Her admission was continued another 2 days until contact could be made with another relative with whom she would stay.         On the day of discharge Alexandra Gray was in much improved condition, denied SI/HI and reported no AVH. She was in full contact with reality and ready for discharge as noted with plans to follow up as noted below.   Consults:  None  Significant Diagnostic Studies:  None  Discharge Vitals:   Blood pressure 106/72, pulse 88, temperature 97.6 F (36.4 C), temperature source Oral, resp. rate 20, height 5' (1.524 m), weight 115.214 kg (254 lb). Body mass index is 49.61 kg/(m^2). Lab Results:   No results found for this or any previous visit (from the past 72 hour(s)).  Physical Findings: AIMS: Facial and Oral Movements Muscles of Facial Expression: None, normal Lips and Perioral Area: None, normal Jaw: None, normal Tongue: None, normal,Extremity Movements Upper (arms, wrists, hands, fingers): None, normal Lower (legs, knees, ankles, toes): None, normal, Trunk Movements Neck, shoulders, hips: None, normal, Overall Severity Severity of abnormal movements (highest score from questions above): None, normal Incapacitation due to abnormal movements: None, normal Patient's awareness of abnormal movements (rate only patient's report): No Awareness, Dental Status Current problems with teeth and/or dentures?: No Does patient usually wear dentures?: No  CIWA:  CIWA-Ar Total:  2 COWS:  COWS Total Score: 2  Psychiatric Specialty Exam: See Psychiatric Specialty Exam and Suicide Risk Assessment completed by Attending Physician prior to discharge.  Discharge destination:  Home  Is patient on multiple antipsychotic therapies at discharge:  No   Has Patient  had three or more failed trials of antipsychotic monotherapy by history:  No  Recommended Plan for Multiple Antipsychotic Therapies: NA  Discharge Orders   Future Orders Complete By Expires   Diet - low sodium heart healthy  As directed    Discharge instructions  As directed    Comments:     Take all of your medications as directed. Be sure to keep all of your follow up appointments.  If you are unable to keep your follow up appointment, call your Doctor's office to let them know, and reschedule.  Make sure that you have enough medication to last until your appointment. Be sure to get plenty of rest. Going to bed at the same time each night will help. Try to avoid sleeping during the day.  Increase your activity as tolerated. Regular exercise will help you to sleep better and improve your mental health. Eating a heart healthy diet is recommended. Try to avoid salty or fried foods. Be sure to avoid all alcohol and illegal drugs.   Increase activity slowly  As directed        Medication List       Indication   cephALEXin 250 MG capsule  Commonly known as:  KEFLEX  Take 1 capsule (250 mg total) by mouth every 6 (six) hours. For infection until the course is completed.   Indication:  Infection of the Skin and Skin Structures     DULoxetine 60 MG capsule  Commonly known as:  CYMBALTA  Take 1 capsule (60 mg total) by mouth daily. For anxiety and depression.   Indication:  Major Depressive Disorder     traZODone 100 MG tablet  Commonly known as:  DESYREL  Take 1 tablet (100 mg total) by mouth at bedtime. For insomnia.   Indication:  Trouble Sleeping           Follow-up Information   [facility] On 08/08/2013. (You are scheduled with April Thornton on Friday, Aparil 12, 2014 at 9 AM)    Contact information:   [address] [place], [place]   [address]   [phone]      Follow up with [facility] On 08/07/2013. (Please go to [facility]  walk in clinic on Thursday, August 07, 2013 or any weekday between 8AM-3PM)    Contact information:   [address] [place], [place]   [address]  [phone]      Follow-up recommendations:   Activities: Resume activity as tolerated. Diet: Heart healthy low sodium diet Tests: Follow up testing will be determined by your out patient provider.  Comments:    Total Discharge Time:  Greater than 30 minutes.  Signed: MASHBURN,NEIL 08/06/2013, 12:04 PM  Patient is seen personally for suicidal risk assessment, case discussed with treatment team and developed discharge plans. Reviewed the information documented and agree with the treatment plan.  Navin Dogan,[name] 08/08/2013 9:17 PM

## 2013-08-06 NOTE — Clinical Social Work Note (Signed)
Writer met with patient who reports she is suicidal and rating depression/anxietya ten.  Patient shared her sister in law has not been able to get into her apartment yet.  She also shared she is upset because godmother has her belongings and will not release them. She stated she is going to need the sheriff to go with her to godmother's home to get her belongings.

## 2013-08-06 NOTE — Progress Notes (Signed)
Recreation Therapy Notes  Date: 09.10.2014 Time: 3:00pm Location: [address] Dayroom  Group Topic: Self Expression  Goal Area(s) Addresses:  Patient will will use art as a means of self-expression. Patient will identify effectively identify emotions experienced during activity.   Behavioral Response: Engaged, Appropriate, Sharing, Tearful  Intervention: Art  Activity: Patients were asked to draw a bottle that would represent their lives, inside the bottle patients were asked to draw or write words to represent how they currently feel about life. Group discussion focused around using currently feelings to effectuate change in their lives.   Education: Pharmacologist, Discharge Planning, Emotional Exploration   Education Outcome: Acknowledges understanding   Clinical Observations/Feedback:  Patient made no contributions to opening discussion, but appeared to actively listen as she maintained appropriate eye contact with speaker. Patient completed her drawing and shared with the group. Patient drew what appeared to be a baby bottle and filled to capacity with words. Patient shared the emotions in her bottle to be primarily anxious words. As patient was sharing she became tearful and expressed that she cannot envision changing and that she does not see the point in living any longer. LRT encouraged patient to identify positive things in her life, however patient was unable to do so. LRT encouraged patient to discuss feelings with LCSW during admission. Shortly after sharing patient left group session, patient did not return.   Marykay Lex Alyia Lacerte, LRT/CTRS  Jearl Klinefelter 08/06/2013 8:37 PM
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
## 2013-08-06 NOTE — [facility] Suicide Risk Assessment (Signed)
Suicide Risk Assessment  Discharge Assessment     Demographic Factors:  Adolescent or young adult, Low socioeconomic status and Unemployed  Mental Status Per Nursing Assessment::   On Admission:  Self-harm thoughts  Current Mental Status by Physician: Patient is calm and cooperative to. Patient stated mood his fine her affect was constricted she has normal speech and thought processes no suicidal or homicidal ideation intentions or plans. She has no evidence of psychotic symptoms.  Loss Factors: Financial problems/change in socioeconomic status  Historical Factors: Prior suicide attempts and Impulsivity  Risk Reduction Factors:   Sense of responsibility to family, Religious beliefs about death, Living with another person, especially a relative, Positive social support and Positive therapeutic relationship  Continued Clinical Symptoms:  Depression:   Recent sense of peace/wellbeing Previous Psychiatric Diagnoses and Treatments  Cognitive Features That Contribute To Risk:  Polarized thinking    Suicide Risk:  Minimal: No identifiable suicidal ideation.  Patients presenting with no risk factors but with morbid ruminations; may be classified as minimal risk based on the severity of the depressive symptoms  Discharge Diagnoses:   AXIS I:  Major Depression, Recurrent severe AXIS II:  Deferred AXIS III:   Past Medical History  Diagnosis Date  . Mental disorder   . Depression   . Sleep apnea    AXIS IV:  other psychosocial or environmental problems, problems related to social environment and problems with primary support group AXIS V:  51-60 moderate symptoms  Plan Of Care/Follow-up recommendations:  Activity:  As tolerated Diet:  Regular  Is patient on multiple antipsychotic therapies at discharge:  No   Has Patient had three or more failed trials of antipsychotic monotherapy by history:  No  Recommended Plan for Multiple Antipsychotic  Therapies: NA  Desaray Marschner,[name] 08/06/2013, 1:12 PM

## 2013-08-06 NOTE — Progress Notes (Signed)
Adult Psychoeducational Group Note  Date:  08/05/13 Time:  8:00 pm  Group Topic/Focus:  Wrap-Up Group:   The focus of this group is to help patients review their daily goal of treatment and discuss progress on daily workbooks.  Participation Level:  Active  Participation Quality:  Appropriate and Sharing  Affect:  Appropriate  Cognitive:  Appropriate  Insight: Good  Engagement in Group:  Engaged  Modes of Intervention:  Discussion, Education, Socialization and Support  Additional Comments: Pt stated her depression caused her to attempt suicide. Pt has a plan to continue taking her medications and using the coping skills that she has learned while in the hospital when she is discharged. Pt stated that she is thankful for her family and friends.   Alexandra Gray 08/06/2013, 1:38 AM

## 2013-08-06 NOTE — [facility] Group Notes (Signed)
[facility] LCSW Group Therapy  Emotional Regulation 1:15 - 2:30  08/06/2013 2:57 PM  Type of Therapy:  Group Therapy  Participation Level:  Did Not Attend    [name] 08/06/2013, 2:57 PM

## 2013-08-06 NOTE — Tx Team (Signed)
Interdisciplinary Treatment Plan Update   Date Reviewed:  08/06/2013  Time Reviewed:  1:16 PM  Progress in Treatment:   Attending groups: Yes Participating in groups: Yes Taking medication as prescribed: Yes  Tolerating medication: Yes Family/Significant other contact made:  Yes, contact made with mother. Patient understands diagnosis: Yes  Discussing patient identified problems/goals with staff: Yes Medical problems stabilized or resolved: Yes Denies suicidal/homicidal ideation: No, but contracts for safety Patient has not harmed self or others: Yes  For review of initial/current patient goals, please see plan of care.  Estimated Length of Stay:  Discharge today.  Reasons for Continued Hospitalization:   New Problems/Goals identified:    Discharge Plan or Barriers:   Home with outpatient follow up at [facility]  Additional Comments:    NA Attendees:  Patient: Alexandra Gray 08/06/2013 1:16 PM   Signature: Mervyn Gay, MD 08/06/2013 1:16 PM  Signature:  Verne Spurr, PA 08/06/2013 1:16 PM  Signature: 08/06/2013 1:16 PM  Signature: Harold Barban, RN 08/06/2013 1:16 PM  Signature:  Neill Loft RN 08/06/2013 1:16 PM  Signature:  Horace Porteous Dionysios Massman, LCSW 08/06/2013 1:16 PM  Signature:  Reyes Ivan, LCSW 08/06/2013 1:16 PM  Signature:  Maseta Dorley,Care Coordinator 08/06/2013 1:16 PM  Signature:  08/06/2013 1:16 PM  Signature: Leighton Parody, RN 08/06/2013  1:16 PM   Signature:    Signature:      Scribe for Treatment Team:   Juline Patch,  08/06/2013 1:16 PM

## 2013-08-06 NOTE — Progress Notes (Signed)
Adult Psychoeducational Group Note  Date:  08/06/2013 Time:  10:00am Group Topic/Focus:  Therapeutic Activity  Participation Level:  Active  Participation Quality:  Appropriate and Attentive  Affect:  Appropriate  Cognitive:  Alert and Appropriate  Insight: Appropriate  Engagement in Group:  Engaged  Modes of Intervention:  Discussion and Education  Additional Comments:  Pt attended and participated in group. When ask the question what coping skills has she learned pt stated walk away from the situation, calm down and be by yourself, and take your mind off of the situation.  Shelly Bombard D 08/06/2013, 2:06 PM

## 2013-08-07 NOTE — Progress Notes (Signed)
Patient ID: Alexandra Gray, female   DOB: [birth year], 22 y.o.   MRN: [id number] [facility] MD Progress Note  08/07/2013 12:03 PM Alexandra Gray  MRN:  [id number]  Subjective:  Patient complaint continues to be depressed, anxious and passive suicidal ideations. She has been working with her case Production designer, theatre/television/film regarding placement. Patient rated her depression has 6/10 and anxiety 6/10 today. Patient has been adjusting to her medication and the milieu therapy and attending groups and learning coping skills.   Diagnosis:   DSM5: Schizophrenia Disorders:   Obsessive-Compulsive Disorders:   Trauma-Stressor Disorders:   Substance/Addictive Disorders:   Depressive Disorders:  Major Depressive Disorder - Severe (296.23)  Axis I: Major Depression, Recurrent severe  ADL's:  Intact  Sleep: Poor  Appetite:  Fair  Suicidal Ideation:  Patient endorses suicide ideation without intention or plan. She contracts for safety in the hospital Homicidal Ideation:  denied AEB (as evidenced by):  Psychiatric Specialty Exam: ROS  Blood pressure 132/83, pulse 93, temperature 98 F (36.7 C), temperature source Oral, resp. rate 19, height 5' (1.524 m), weight 115.214 kg (254 lb).Body mass index is 49.61 kg/(m^2).  General Appearance: Casual and Guarded  Eye Contact::  Minimal  Speech:  Clear and Coherent  Volume:  Decreased  Mood:  Angry, Anxious, Depressed, Hopeless and Worthless  Affect:  Constricted and Depressed  Thought Process:  Goal Directed and Intact  Orientation:  Full (Time, Place, and Person)  Thought Content:  Obsessions and Rumination  Suicidal Thoughts:  Yes.  without intent/plan  Homicidal Thoughts:  No  Memory:  Immediate;   Fair  Judgement:  Impaired  Insight:  Lacking  Psychomotor Activity:  Psychomotor Retardation  Concentration:  Fair  Recall:  Good  Akathisia:  NA  Handed:  Right  AIMS (if indicated):     Assets:  Communication Skills Desire for Improvement Leisure  Time Physical Health Resilience Social Support  Sleep:  Number of Hours: 6.5   Current Medications: Current Facility-Administered Medications  Medication Dose Route Frequency Provider Last Rate Last Dose  . cephALEXin (KEFLEX) capsule 250 mg  250 mg Oral Q6H Nanine Means, NP   250 mg at 08/07/13 1152  . DULoxetine (CYMBALTA) DR capsule 60 mg  60 mg Oral Daily Nanine Means, NP   60 mg at 08/07/13 0741  . ibuprofen (ADVIL,MOTRIN) tablet 600 mg  600 mg Oral Q8H PRN Nanine Means, NP   600 mg at 08/06/13 1702  . magnesium hydroxide (MILK OF MAGNESIA) suspension 30 mL  30 mL Oral Daily PRN Nanine Means, NP      . nicotine (NICODERM CQ - dosed in mg/24 hours) patch 21 mg  21 mg Transdermal Daily Nanine Means, NP      . traZODone (DESYREL) tablet 100 mg  100 mg Oral QHS Nehemiah Settle, MD   100 mg at 08/06/13 2106    Lab Results: No results found for this or any previous visit (from the past 48 hour(s)).  Physical Findings: AIMS: Facial and Oral Movements Muscles of Facial Expression: None, normal Lips and Perioral Area: None, normal Jaw: None, normal Tongue: None, normal,Extremity Movements Upper (arms, wrists, hands, fingers): None, normal Lower (legs, knees, ankles, toes): None, normal, Trunk Movements Neck, shoulders, hips: None, normal, Overall Severity Severity of abnormal movements (highest score from questions above): None, normal Incapacitation due to abnormal movements: None, normal Patient's awareness of abnormal movements (rate only patient's report): No Awareness, Dental Status Current problems with teeth and/or dentures?: No Does patient usually  wear dentures?: No  CIWA:  CIWA-Ar Total: 2 COWS:  COWS Total Score: 2  Treatment Plan Summary: Daily contact with patient to assess and evaluate symptoms and progress in treatment Medication management  Plan: Continue Trazodone 100 mg at 6 PM Continue with Cymbalta 60 mg daily Treatment Plan/Recommendations:   1.  Admit for crisis management and stabilization. 2. Medication management to reduce current symptoms to base line and improve the patient's overall level of functioning. 3. Treat health problems as indicated. 4. Develop treatment plan to decrease risk of relapse upon discharge and to reduce the need for readmission. 5. Psycho-social education regarding relapse prevention and self care. 6. Health care follow up as needed for medical problems. 7. Restart home medications where appropriate.   Medical Decision Making Problem Points:  Established problem, worsening (2), Review of last therapy session (1) and Review of psycho-social stressors (1) Data Points:  Review or order clinical lab tests (1) Review or order medicine tests (1) Review of medication regiment & side effects (2) Review of new medications or change in dosage (2)  I certify that inpatient services furnished can reasonably be expected to improve the patient's condition.   Nehemiah Settle., MD 08/07/2013, 12:03 PM

## 2013-08-07 NOTE — Progress Notes (Signed)
Adult Psychoeducational Group Note  Date:  08/07/2013 Time:  10:00am Group Topic/Focus:  Therapeutic Activity  Participation Level:  Active  Participation Quality:  Appropriate and Attentive  Affect:  Appropriate  Cognitive:  Alert and Appropriate  Insight: Appropriate  Engagement in Group:  Engaged  Modes of Intervention:  Discussion and Education  Additional Comments:  Pt attended and participated in group. Pt was asked to tell  Of a good memory and she stated when both of her nieces were born.  Shelly Bombard D 08/07/2013, 1:13 PM

## 2013-08-07 NOTE — Progress Notes (Signed)
Adult Psychoeducational Group Note  Date:  08/07/2013 Time:  9:00 AM  Group Topic/Focus:  Dimensions of Wellness:   The focus of this group is to introduce the topic of wellness and discuss the role each dimension of wellness plays in total health.  Participation Level:  Active  Participation Quality:  Appropriate and Attentive  Affect:  Appropriate  Cognitive:  Alert and Oriented  Insight: Good  Engagement in Group:  Engaged  Modes of Intervention:  Discussion  Additional Comments:  Pt. shared with the group that a movie that brings laughter into her life is "[employer]."   Harold Barban E 08/07/2013, 10:06 AM

## 2013-08-07 NOTE — [facility] Group Notes (Signed)
[facility] LCSW Group Therapy  Living a Balanced Life  1:15 - 3: 30          08/07/2013  3:21 PM     Type of Therapy:  Group Therapy  Participation Level:  Appropriate  Participation Quality:  Appropriate  Affect:  Appropriate  Cognitive:  Attentive Appropriate  Insight:  Engaged  Engagement in Therapy:  Engaged  Modes of Intervention:  Discussion Exploration Problem-Solving Supportive  Summary of Progress/Problems: Patient shared she needs to work on balancing her life.  She stated she goes to school but does not have friends to spend time with.  Patient was advised of [facility] as a way of meeting people and developing a support group.  [name] 08/07/2013 3:21 PM

## 2013-08-07 NOTE — Clinical Social Work Note (Signed)
Message left on mother's voice mail this morning and late afternoon on 08/06/13  advising of plans to discharge patient on 08/08/13.  Mother was asked to provide contact information for sister -in-law with whom patient will bel living.

## 2013-08-07 NOTE — Progress Notes (Signed)
D   Pt is cooperative and pleasant  She denies suicidal and homicidal ideation   Pt received her antibiotic early since she said staff was waking her up at midnight to give it to her and she had trouble falling back to sleep   Pt attended group and has been appropriate with others    A   Verbal support given   Medications administered and effectiveness monitored   Q 15 min checks R   Pt safe at present

## 2013-08-07 NOTE — Progress Notes (Signed)
Adult Psychoeducational Group Note  Date:  08/07/2013 Time:  3:11 PM  Group Topic/Focus:  Overcoming Stress:   The focus of this group is to define stress and help patients assess their triggers.  Participation Level:  Active  Participation Quality:  Appropriate  Affect:  Appropriate  Cognitive:  Alert and Appropriate  Insight: Appropriate and Good  Engagement in Group:  Engaged  Modes of Intervention:  Discussion, Education and Support  Additional Comments:  Pt actively participated in group discussion by sharing that stress means "trying to take on the world." Pt identified that panic attacks are a physical sign of stress. Pt verbalized that arguing/disagreement is a trigger for stress for her. Pt shared that she has historically coped negatively with stress by isolating, and she has coped positively by reading and taking a walk.  Reinaldo Raddle K 08/07/2013, 3:11 PM

## 2013-08-07 NOTE — Progress Notes (Signed)
D: Patient's affect is appropriate to circumstance, but mood is depressed. She reported on the self inventory sheet that she slept well last night, appetite and ability to pay attention are both good and energy level is low. Patient rated depression "8" and feelings of hopelessness "7". She verbalized that she's not suicidal, but reported on the self inventory that she has been passive in the last 24 hours.  A: Support and encouragement provided to patient. Administered scheduled medications per ordering MD. Monitor Q15 minute checks for safety.  R: Patient receptive. Denies HI and AVH. Patient remains safe on the unit.

## 2013-08-08 NOTE — Progress Notes (Signed)
Discharge Note: Discharge instructions/prescriptions/medication samples given to patient. Patient verbalized understanding of discharge instructions and prescriptions. Returned belongings to patient. Denies SI/HI/AVH. Patient d/c without incident to the lobby and transported home by father. 
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
## 2013-08-08 NOTE — Progress Notes (Signed)
Adult Psychoeducational Group Note  Date:  08/08/2013 Time:  1:29 PM  Group Topic/Focus:  Relapse Prevention Planning:   The focus of this group is to define relapse and discuss the need for planning to combat relapse.  Participation Level:  Active  Participation Quality:  Appropriate and Supportive  Affect:  Appropriate  Cognitive:  Appropriate  Insight: Good  Engagement in Group:  Engaged and Improving  Modes of Intervention:  Discussion, Education, Role-play, Socialization and Support  Additional Comments:    Reynolds Bowl 08/08/2013, 1:29 PM

## 2013-08-08 NOTE — Progress Notes (Signed)
[facility] Adult Case Management Discharge Plan :  Will you be returning to the same living situation after discharge: No.  Patient to live with sister-in-law. At discharge, do you have transportation home?:Yes,  Sister in law to transport patient home. Do you have the ability to pay for your medications:Yes,  Patient has Medicaid.  Release of information consent forms completed and in the chart;  Patient's signature needed at discharge.  Patient to Follow up at: Follow-up Information   Follow up with [facility] On . (Message left on April's voicemail to call patient at home with appointment)    Contact information:   [address] [place], [place]   [address]   [phone]      Follow up with [facility] On 08/11/2013. (Please go to [facility] walk in clinic on Monday, August 11, 2013 or any weekday between 8AM-3PM)    Contact information:   [address] [place], [place]   [address]  [phone]      Patient denies SI/HI: Patient no longer endorsing SI/HI or other thoughts of self harm.  Safety Planning and Suicide Prevention discussed: .Reviewed with all patients during discharge planning group  Robbie Nangle, Joesph July 08/08/2013, 11:37 AM

## 2013-08-08 NOTE — [facility] Group Notes (Signed)
[facility] LCSW Aftercare Discharge Planning Group Note   08/08/2013 11:39 AM    Participation Quality:  Appropraite  Mood/Affect:  Appropriate  Depression Rating:  1  Anxiety Rating:  1  Thoughts of Suicide:  No  Will you contract for safety?   NA  Current AVH:  No  Plan for Discharge/Comments:  Patient attending discharge planning group and actively participated in group.  She reports doing well today and looking forward to discharging. Patient to follow up with [facility] and [employer].  CSW provided all participants with daily workbook.  Transportation Means: Patient has transportation.   Supports:  Patient has a support system.   Merritt Kibby, Joesph July

## 2013-08-08 NOTE — Tx Team (Signed)
Interdisciplinary Treatment Plan Update   Date Reviewed:  08/08/2013  Time Reviewed:  9:45 AM  Progress in Treatment:   Attending groups: Yes Participating in groups: Yes Taking medication as prescribed: Yes  Tolerating medication: Yes Family/Significant other contact made:  Yes, contact made with mother. Patient understands diagnosis: Yes  Discussing patient identified problems/goals with staff: Yes Medical problems stabilized or resolved: Yes Denies suicidal/homicidal ideation: No, but contracts for safety Patient has not harmed self or others: Yes  For review of initial/current patient goals, please see plan of care.  Estimated Length of Stay:  Discharge today.  Reasons for Continued Hospitalization:   New Problems/Goals identified:    Discharge Plan or Barriers:   Home with outpatient follow up at [facility]  Additional Comments:    NA Attendees:  Patient: Alexandra Gray 08/08/2013 9:45 AM   Signature: Mervyn Gay, MD 08/08/2013 9:45 AM  Signature:  Verne Spurr, PA 08/08/2013 9:45 AM  Signature: 08/08/2013 9:45 AM  Signature: Harold Barban, RN 08/08/2013 9:45 AM  Signature:  Cheron Every,  RN 08/08/2013 9:45 AM  Signature:  Juline Patch, LCSW 08/08/2013 9:45 AM  Signature:  Reyes Ivan, LCSW 08/08/2013 9:45 AM  Signature:  Maseta Dorley,Care Coordinator 08/08/2013 9:45 AM  Signature:  08/08/2013 9:45 AM  Signature:  08/08/2013  9:45 AM  Signature:    Signature:      Scribe for Treatment Team:   Juline Patch,  08/08/2013 9:45 AM

## 2013-08-12 NOTE — Progress Notes (Signed)
Patient Discharge Instructions:  After Visit Summary (AVS):   Faxed to:  08/12/13 Discharge Summary Note:   Faxed to:  08/12/13 Psychiatric Admission Assessment Note:   Faxed to:  08/12/13 Suicide Risk Assessment - Discharge Assessment:   Faxed to:  08/12/13 Faxed/Sent to the Next Level Care provider:  08/12/13 Faxed to [facility] @ [phone] Faxed to [facility] @ [phone]  Jerelene Redden, 08/12/2013, 4:04 PM

## 2014-01-26 ENCOUNTER — Emergency Department (HOSPITAL_COMMUNITY)
Admission: EM | Admit: 2014-01-26 | Discharge: 2014-01-26 | Disposition: A | Discharge status: Home / Self Care | Payer: Medicaid Other | Attending: Emergency Medicine | Admitting: Emergency Medicine

## 2014-01-26 ENCOUNTER — Encounter (HOSPITAL_COMMUNITY): Payer: Self-pay | Admitting: Emergency Medicine

## 2014-01-26 DIAGNOSIS — Z79899 Other long term (current) drug therapy: Secondary | ICD-10-CM | POA: Insufficient documentation

## 2014-01-26 DIAGNOSIS — F329 Major depressive disorder, single episode, unspecified: Secondary | ICD-10-CM | POA: Insufficient documentation

## 2014-01-26 DIAGNOSIS — M779 Enthesopathy, unspecified: Secondary | ICD-10-CM

## 2014-01-26 DIAGNOSIS — M2550 Pain in unspecified joint: Secondary | ICD-10-CM | POA: Insufficient documentation

## 2014-01-26 DIAGNOSIS — IMO0001 Reserved for inherently not codable concepts without codable children: Secondary | ICD-10-CM | POA: Insufficient documentation

## 2014-01-26 DIAGNOSIS — G4730 Sleep apnea, unspecified: Secondary | ICD-10-CM | POA: Insufficient documentation

## 2014-01-26 DIAGNOSIS — M2540 Effusion, unspecified joint: Secondary | ICD-10-CM | POA: Insufficient documentation

## 2014-01-26 DIAGNOSIS — M6580 Other synovitis and tenosynovitis, unspecified site: Secondary | ICD-10-CM | POA: Insufficient documentation

## 2014-01-26 DIAGNOSIS — F3289 Other specified depressive episodes: Secondary | ICD-10-CM | POA: Insufficient documentation

## 2014-01-26 DIAGNOSIS — F489 Nonpsychotic mental disorder, unspecified: Secondary | ICD-10-CM | POA: Insufficient documentation

## 2014-01-26 MED ORDER — ACETAMINOPHEN 325 MG PO TABS
650.0000 mg | ORAL_TABLET | Freq: Once | ORAL | Status: AC
  Administered 2014-01-26: 650 mg via ORAL
  Filled 2014-01-26: qty 2

## 2014-01-26 MED ORDER — KETOROLAC TROMETHAMINE 10 MG PO TABS
10.0000 mg | ORAL_TABLET | ORAL | Status: AC
  Administered 2014-01-26: 10 mg via ORAL
  Filled 2014-01-26: qty 1

## 2014-01-26 MED ORDER — DICLOFENAC SODIUM 75 MG PO TBEC: 75.0000 mg | DELAYED_RELEASE_TABLET | Freq: Two times a day (BID) | ORAL | Status: DC

## 2014-01-26 MED ORDER — HYDROCODONE-ACETAMINOPHEN 5-325 MG PO TABS: 1.0000 | ORAL_TABLET | ORAL | Status: DC | PRN

## 2014-01-26 NOTE — ED Provider Notes (Signed)
CSN: [id number]     Arrival date & time 01/26/14  1025 History  This chart was scribed for non-physician practitioner, Ivery QualeHobson Marquavius Scaife, PA-C working with Suzi RootsKevin E Steinl, MD by Greggory StallionKayla Andersen, ED scribe. This patient was seen in room TR07C/TR07C and the patient's care was started at 12:09 PM.   Chief Complaint  Patient presents with  . Arm Pain  . Hand Pain   The history is provided by the patient. No language interpreter was used.   HPI Comments: Alexandra Gray is a 23 y.o. female who presents to the Emergency Department complaining of gradual onset, constant right arm, wrist and hand pain that started one week ago. She was doing heavy lifting and hit her arm on a shovel before the pain started. Pt also has some swelling in her wrist and arm. Certain movements worsen the pain. She has taken tylenol with no relief.   Past Medical History  Diagnosis Date  . Mental disorder   . Depression   . Sleep apnea    Past Surgical History  Procedure Laterality Date  . Knee surgery     No family history on file. History  Substance Use Topics  . Smoking status: Never Smoker   . Smokeless tobacco: Not on file  . Alcohol Use: No   OB History   Grav Para Term Preterm Abortions TAB SAB Ect Mult Living                 Review of Systems  Musculoskeletal: Positive for arthralgias, joint swelling and myalgias.  All other systems reviewed and are negative.   Allergies  Review of patient's allergies indicates no known allergies.  Home Medications   Current Outpatient Rx  Name  Route  Sig  Dispense  Refill  . cephALEXin (KEFLEX) 250 MG capsule   Oral   Take 1 capsule (250 mg total) by mouth every 6 (six) hours. For infection until the course is completed.         . DULoxetine (CYMBALTA) 60 MG capsule   Oral   Take 1 capsule (60 mg total) by mouth daily. For anxiety and depression.   30 capsule   0   . traZODone (DESYREL) 100 MG tablet   Oral   Take 1 tablet (100 mg total) by mouth  at bedtime. For insomnia.   30 tablet   0    BP 133/84  Pulse 75  Temp(Src) 98 F (36.7 C) (Oral)  Resp 18  Ht 5' (1.524 m)  Wt 240 lb (108.863 kg)  BMI 46.87 kg/m2  SpO2 100%  Physical Exam  Nursing note and vitals reviewed. Constitutional: She is oriented to person, place, and time. She appears well-developed and well-nourished. No distress.  HENT:  Head: Normocephalic and atraumatic.  Eyes: EOM are normal.  Neck: Neck supple. No tracheal deviation present.  Cardiovascular: Normal rate, regular rhythm and normal heart sounds.   Pulmonary/Chest: Effort normal and breath sounds normal. No respiratory distress. She has no wheezes. She has no rales.  Musculoskeletal: Normal range of motion.  Soreness of the dorsum of the right hand. Capillary refill is less than 2 seconds. Pain with flexion and extension of the wrist. No pain at the anatomical snuff box. Pain with ROM of shoulder and elbow. Pain in the right forearm with pronation. Soreness of the anterior right shoulder.   Neurological: She is alert and oriented to person, place, and time.  Skin: Skin is warm and dry.  Psychiatric: She has a  normal mood and affect. Her behavior is normal.    ED Course  Procedures (including critical care time)  DIAGNOSTIC STUDIES: Oxygen Saturation is 100% on RA, normal by my interpretation.    COORDINATION OF CARE: 12:14 PM-Discussed treatment plan which includes a sling and an anti-inflammatory with pt at bedside and pt agreed to plan.   Labs Review Labs Reviewed - No data to display Imaging Review No results found.   EKG Interpretation None      MDM Patient reports a shoveling snow earlier during the week. She also reports doing heavy lifting at her job. She now has pain of the shoulder, elbow, and wrist. The examination is consistent with tendinitis. The plan at this time is for the patient to be treated with diclofenac, Decadron, and Ultram. Patient fitted with a sling.     Final diagnoses:  None    *I have reviewed nursing notes, vital signs, and all appropriate lab and imaging results for this patient.**  *I personally performed the services described in this documentation, which was scribed in my presence. The recorded information has been reviewed and is accurate.Kathie Dike, PA-C 01/26/14 1236

## 2014-01-26 NOTE — Discharge Instructions (Signed)
Please keep your arm warm. Please use diclofenac 2 times daily with food. Use Norco for pain if needed. Please see your primary physician, or the orthopedic listed above if not improving. Tendinitis Tendinitis is swelling and inflammation of the tendons. Tendons are band-like tissues that connect muscle to bone. Tendinitis commonly occurs in the:   Shoulders (rotator cuff).  Heels (Achilles tendon).  Elbows (triceps tendon). CAUSES Tendinitis is usually caused by overusing the tendon, muscles, and joints involved. When the tissue surrounding a tendon (synovium) becomes inflamed, it is called tenosynovitis. Tendinitis commonly develops in people whose jobs require repetitive motions. SYMPTOMS  Pain.  Tenderness.  Mild swelling. DIAGNOSIS Tendinitis is usually diagnosed by physical exam. Your caregiver may also order X-rays or other imaging tests. TREATMENT Your caregiver may recommend certain medicines or exercises for your treatment. HOME CARE INSTRUCTIONS   Use a sling or splint for as long as directed by your caregiver until the pain decreases.  Put ice on the injured area.  Put ice in a plastic bag.  Place a towel between your skin and the bag.  Leave the ice on for 15-20 minutes, 03-04 times a day.  Avoid using the limb while the tendon is painful. Perform gentle range of motion exercises only as directed by your caregiver. Stop exercises if pain or discomfort increase, unless directed otherwise by your caregiver.  Only take over-the-counter or prescription medicines for pain, discomfort, or fever as directed by your caregiver. SEEK MEDICAL CARE IF:   Your pain and swelling increase.  You develop new, unexplained symptoms, especially increased numbness in the hands. MAKE SURE YOU:   Understand these instructions.  Will watch your condition.  Will get help right away if you are not doing well or get worse. Document Released: 11/10/2000 Document Revised: 02/05/2012  Document Reviewed: 01/30/2011 [facility] Patient Information 2014 [employer], [place].

## 2014-01-26 NOTE — ED Notes (Signed)
Patient reports onset of pain and swelling in her right arm and hand post lifting heavy boxes last week.  No trauma.  Patient has taken tylenol for pain w/o relief.  Last dose was 0700

## 2014-01-27 NOTE — ED Provider Notes (Signed)
Medical screening examination/treatment/procedure(s) were performed by non-physician practitioner and as supervising physician I was immediately available for consultation/collaboration.   EKG Interpretation None        Suzi RootsKevin E Icelyn Navarrete, MD 01/27/14 [phone]

## 2014-02-15 ENCOUNTER — Encounter (HOSPITAL_COMMUNITY): Payer: Self-pay | Admitting: Emergency Medicine

## 2014-02-15 ENCOUNTER — Emergency Department (HOSPITAL_COMMUNITY)
Admission: EM | Admit: 2014-02-15 | Discharge: 2014-02-16 | Disposition: A | Discharge status: Other Acute Inpatient Hospital | Payer: Medicaid Other | Attending: Emergency Medicine | Admitting: Emergency Medicine

## 2014-02-15 DIAGNOSIS — F329 Major depressive disorder, single episode, unspecified: Secondary | ICD-10-CM

## 2014-02-15 DIAGNOSIS — R45850 Homicidal ideations: Secondary | ICD-10-CM | POA: Insufficient documentation

## 2014-02-15 DIAGNOSIS — T50901A Poisoning by unspecified drugs, medicaments and biological substances, accidental (unintentional), initial encounter: Secondary | ICD-10-CM

## 2014-02-15 DIAGNOSIS — Z79899 Other long term (current) drug therapy: Secondary | ICD-10-CM | POA: Insufficient documentation

## 2014-02-15 DIAGNOSIS — F3289 Other specified depressive episodes: Secondary | ICD-10-CM | POA: Insufficient documentation

## 2014-02-15 DIAGNOSIS — N390 Urinary tract infection, site not specified: Secondary | ICD-10-CM | POA: Insufficient documentation

## 2014-02-15 DIAGNOSIS — Z3202 Encounter for pregnancy test, result negative: Secondary | ICD-10-CM | POA: Insufficient documentation

## 2014-02-15 DIAGNOSIS — F32A Depression, unspecified: Secondary | ICD-10-CM

## 2014-02-15 DIAGNOSIS — R45851 Suicidal ideations: Secondary | ICD-10-CM

## 2014-02-15 DIAGNOSIS — T43294A Poisoning by other antidepressants, undetermined, initial encounter: Secondary | ICD-10-CM | POA: Insufficient documentation

## 2014-02-15 DIAGNOSIS — G4730 Sleep apnea, unspecified: Secondary | ICD-10-CM | POA: Insufficient documentation

## 2014-02-15 DIAGNOSIS — T43502A Poisoning by unspecified antipsychotics and neuroleptics, intentional self-harm, initial encounter: Secondary | ICD-10-CM | POA: Insufficient documentation

## 2014-02-15 DIAGNOSIS — T438X2A Poisoning by other psychotropic drugs, intentional self-harm, initial encounter: Secondary | ICD-10-CM

## 2014-02-15 LAB — COMPREHENSIVE METABOLIC PANEL
ALK PHOS: 96 U/L (ref 39–117)
ALT: 17 U/L (ref 0–35)
AST: 14 U/L (ref 0–37)
Albumin: 3.7 g/dL (ref 3.5–5.2)
BILIRUBIN TOTAL: 0.3 mg/dL (ref 0.3–1.2)
BUN: 12 mg/dL (ref 6–23)
CHLORIDE: 99 meq/L (ref 96–112)
CO2: 25 mEq/L (ref 19–32)
Calcium: 9.2 mg/dL (ref 8.4–10.5)
Creatinine, Ser: 0.67 mg/dL (ref 0.50–1.10)
GFR calc Af Amer: >90 mL/min (ref >90)
Glucose, Bld: 113 mg/dL — ABNORMAL HIGH (ref 70–99)
Potassium: 3.3 mEq/L — ABNORMAL LOW (ref 3.7–5.3)
Sodium: 140 mEq/L (ref 137–147)
Total Protein: 7.3 g/dL (ref 6.0–8.3)

## 2014-02-15 LAB — URINALYSIS, ROUTINE W REFLEX MICROSCOPIC
BILIRUBIN URINE: NEGATIVE
Bilirubin Urine: NEGATIVE
Glucose, UA: NEGATIVE mg/dL
Glucose, UA: NEGATIVE mg/dL
Hgb urine dipstick: NEGATIVE
Ketones, ur: NEGATIVE mg/dL
Ketones, ur: NEGATIVE mg/dL
NITRITE: NEGATIVE
Nitrite: NEGATIVE
PH: 8 (ref 5.0–8.0)
PROTEIN: 100 mg/dL — AB
Protein, ur: NEGATIVE mg/dL
SPECIFIC GRAVITY, URINE: 1.027 (ref 1.005–1.030)
Specific Gravity, Urine: 1.025 (ref 1.005–1.030)
Urobilinogen, UA: 0.2 mg/dL (ref 0.0–1.0)
Urobilinogen, UA: 0.2 mg/dL (ref 0.0–1.0)
pH: 6.5 (ref 5.0–8.0)

## 2014-02-15 LAB — RAPID URINE DRUG SCREEN, HOSP PERFORMED
Amphetamines: NOT DETECTED
Barbiturates: NOT DETECTED
Benzodiazepines: NOT DETECTED
Cocaine: NOT DETECTED
Opiates: NOT DETECTED
Tetrahydrocannabinol: NOT DETECTED

## 2014-02-15 LAB — URINE MICROSCOPIC-ADD ON

## 2014-02-15 LAB — CBC WITH DIFFERENTIAL/PLATELET
BASOS ABS: 0 10*3/uL (ref 0.0–0.1)
Basophils Relative: 0 % (ref 0–1)
Eosinophils Absolute: 0.2 10*3/uL (ref 0.0–0.7)
Eosinophils Relative: 2 % (ref 0–5)
HCT: 40.4 % (ref 36.0–46.0)
Hemoglobin: 13.6 g/dL (ref 12.0–15.0)
LYMPHS PCT: 36 % (ref 12–46)
Lymphs Abs: 3.1 10*3/uL (ref 0.7–4.0)
MCH: 27.9 pg (ref 26.0–34.0)
MCHC: 33.7 g/dL (ref 30.0–36.0)
MCV: 82.8 fL (ref 78.0–100.0)
Monocytes Absolute: 0.3 10*3/uL (ref 0.1–1.0)
Monocytes Relative: 4 % (ref 3–12)
NEUTROS ABS: 5.1 10*3/uL (ref 1.7–7.7)
Neutrophils Relative %: 59 % (ref 43–77)
Platelets: 245 10*3/uL (ref 150–400)
RBC: 4.88 MIL/uL (ref 3.87–5.11)
RDW: 14.1 % (ref 11.5–15.5)
WBC: 8.7 10*3/uL (ref 4.0–10.5)

## 2014-02-15 LAB — ETHANOL: Alcohol, Ethyl (B): <11 mg/dL (ref 0–11)

## 2014-02-15 LAB — SALICYLATE LEVEL: Salicylate Lvl: <2 mg/dL — ABNORMAL LOW (ref 2.8–20.0)

## 2014-02-15 LAB — POC URINE PREG, ED: PREG TEST UR: NEGATIVE

## 2014-02-15 LAB — ACETAMINOPHEN LEVEL

## 2014-02-15 MED ORDER — POTASSIUM CHLORIDE CRYS ER 20 MEQ PO TBCR
30.0000 meq | EXTENDED_RELEASE_TABLET | Freq: Once | ORAL | Status: AC
  Administered 2014-02-16: 30 meq via ORAL

## 2014-02-15 MED ORDER — CEPHALEXIN 250 MG PO CAPS
1000.0000 mg | ORAL_CAPSULE | Freq: Two times a day (BID) | ORAL | Status: DC
  Administered 2014-02-15: 1000 mg via ORAL
  Filled 2014-02-15: qty 4

## 2014-02-15 NOTE — ED Notes (Signed)
Tele psych in progress at this time. 

## 2014-02-15 NOTE — ED Notes (Signed)
Father- Trevor MaceJesse Bronaugh- [phone]- [phone]

## 2014-02-15 NOTE — ED Notes (Signed)
Security called to wand patient 

## 2014-02-15 NOTE — ED Notes (Addendum)
Pt arrived with father POV. Pt stated that she took 7 Tramadol that were 50mg  each at approx 0830 this morning. Pt denies that she is trying to harm herself but stated that she took the medication because of stress. Pt currently is drowsy, A&O x4. Pt father at bedside.
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
## 2014-02-15 NOTE — ED Provider Notes (Signed)
CSN: [id number]     Arrival date & time 02/15/14  4098 History   First MD Initiated Contact with Patient 02/15/14 [phone]     Chief Complaint  Patient presents with  . Drug Overdose     (Consider location/radiation/quality/duration/timing/severity/associated sxs/prior Treatment) HPI 23 year old female with history of depression sleep apnea self-mutilation has chronic depression states has been more depressed the last few weeks about relationships the family states she has been suicidal the last 2 days with overdose today to kill herself, she also did have some threats to hurt her family but no specific plan; she states essentially she overdosed this morning on trazodone she started feeling tired and told her father what she had done who brought her to the emergency department she states her ingestion was 8:30 this morning he denies any other coingestions. Though she is feeling tired she has no other specific complaints such as chest pain shortness breath abdominal pain vomiting or other concerns. She denies hallucinations. There is no treatment prior to arrival. Past Medical History  Diagnosis Date  . Mental disorder   . Depression   . Sleep apnea    Past Surgical History  Procedure Laterality Date  . Knee surgery     No family history on file. History  Substance Use Topics  . Smoking status: Never Smoker   . Smokeless tobacco: Not on file  . Alcohol Use: No   OB History   Grav Para Term Preterm Abortions TAB SAB Ect Mult Living                 Review of Systems 10 Systems reviewed and are negative for acute change except as noted in the HPI.   Allergies  Review of patient's allergies indicates no known allergies.  Home Medications   Current Outpatient Rx  Name  Route  Sig  Dispense  Refill  . DULoxetine (CYMBALTA) 60 MG capsule   Oral   Take 60 mg by mouth daily.         . traZODone (DESYREL) 100 MG tablet   Oral   Take 100 mg by mouth at bedtime.         .  cephALEXin (KEFLEX) 500 MG capsule      2 caps po bid x 7 days   28 capsule   0    BP 118/75  Pulse 97  Temp(Src) 98.2 F (36.8 C) (Oral)  Resp 16  Ht 5\' 1"  (1.549 m)  Wt 245 lb (111.131 kg)  BMI 46.32 kg/m2  SpO2 98%  LMP 01/18/2014 Physical Exam  Nursing note and vitals reviewed. Constitutional: She is oriented to person, place, and time.  Awake, alert but somewhat drowsy, nontoxic appearance.  HENT:  Head: Atraumatic.  Eyes: Right eye exhibits no discharge. Left eye exhibits no discharge.  Neck: Neck supple.  Cardiovascular: Normal rate and regular rhythm.   No murmur heard. Pulmonary/Chest: Effort normal and breath sounds normal. No respiratory distress. She has no wheezes. She has no rales. She exhibits no tenderness.  Abdominal: Soft. Bowel sounds are normal. She exhibits no distension. There is no tenderness. There is no rebound.  Musculoskeletal: She exhibits no edema and no tenderness.  Baseline ROM, no obvious new focal weakness.  Neurological: She is alert and oriented to person, place, and time.  Mental status and motor strength appears baseline for patient and situation.  Skin: No rash noted.  Psychiatric:  Depressed with suicidal ideation with self reported overdose with trazodone just prior to  arrival with vague thoughts of harming family as well without hallucinations    ED Course  Procedures (including critical care time) Pt stable in ED with no significant deterioration in condition. Patient informed of clinical course, understand medical decision-making process, and agree with plan. TTS recs admit; Pt voluntary. 1630 Labs Review Labs Reviewed  COMPREHENSIVE METABOLIC PANEL - Abnormal; Notable for the following:    Potassium 3.3 (*)    Glucose, Bld 113 (*)    All other components within normal limits  SALICYLATE LEVEL - Abnormal; Notable for the following:    Salicylate Lvl <2.0 (*)    All other components within normal limits  URINALYSIS, ROUTINE  W REFLEX MICROSCOPIC - Abnormal; Notable for the following:    APPearance TURBID (*)    Hgb urine dipstick LARGE (*)    Protein, ur 100 (*)    Leukocytes, UA LARGE (*)    All other components within normal limits  URINE MICROSCOPIC-ADD ON - Abnormal; Notable for the following:    Squamous Epithelial / LPF MANY (*)    Bacteria, UA MANY (*)    All other components within normal limits  URINALYSIS, ROUTINE W REFLEX MICROSCOPIC - Abnormal; Notable for the following:    APPearance CLOUDY (*)    Leukocytes, UA MODERATE (*)    All other components within normal limits  URINE MICROSCOPIC-ADD ON - Abnormal; Notable for the following:    Squamous Epithelial / LPF FEW (*)    Bacteria, UA FEW (*)    All other components within normal limits  ACETAMINOPHEN LEVEL  CBC WITH DIFFERENTIAL  URINE RAPID DRUG SCREEN (HOSP PERFORMED)  ETHANOL  POTASSIUM  POC URINE PREG, ED   Imaging Review No results found.   EKG Interpretation   Date/Time:  Sunday February 15 2014 11:29:17 EDT Ventricular Rate:  81 PR Interval:  161 QRS Duration: 102 QT Interval:  402 QTC Calculation: 467 R Axis:   78 Text Interpretation:  Sinus rhythm Low voltage, precordial leads No  significant change since last tracing Confirmed by [facility]  MD, Jonny RuizJOHN  [phone]([id number]) on 02/15/2014 11:50:35 AM      MDM   Final diagnoses:  Overdose  Suicidal ideation  Depression  UTI (urinary tract infection)    Dispo pending.    Hurman HornJohn M Carrell Palmatier, MD 02/16/14 2056

## 2014-02-15 NOTE — ED Notes (Signed)
Pt now stating that she had thoughts of hurting family and also took Trazodone this morning.

## 2014-02-15 NOTE — ED Notes (Signed)
Poison control called back to check on patient.  vss

## 2014-02-15 NOTE — ED Notes (Signed)
[name] mother called.  Pt is at desk talking to mom on phone.

## 2014-02-16 DIAGNOSIS — G4730 Sleep apnea, unspecified: Secondary | ICD-10-CM | POA: Insufficient documentation

## 2014-02-16 DIAGNOSIS — F6089 Other specific personality disorders: Secondary | ICD-10-CM | POA: Insufficient documentation

## 2014-02-16 DIAGNOSIS — F4310 Post-traumatic stress disorder, unspecified: Secondary | ICD-10-CM | POA: Insufficient documentation

## 2014-02-16 LAB — POTASSIUM: POTASSIUM: 4.6 meq/L (ref 3.7–5.3)

## 2014-02-16 MED ORDER — CEPHALEXIN 500 MG PO CAPS: ORAL_CAPSULE | ORAL | Status: DC

## 2014-02-16 NOTE — ED Notes (Signed)
Officer Almonor present [facility]. All paperwork and pt's belongings sent with officers. [name] belongings from safe also obtained and given to officer Almonor. Pt is stable and ready for transport.

## 2014-02-16 NOTE — ED Notes (Signed)
Marcelino DusterMichelle, Psych [facility] with [facility] confirmed [facility] papers have been faxed to [phone] per Gerda DissMarcy Martin, RN request.

## 2014-02-16 NOTE — Progress Notes (Signed)
MHT contacted by Bill, RN at [facility] to accept pt on behalf of Dr.Nadir Attiah [facility].  The RN report# [phone], when you hear the recording press 3 to get a RN.  Hospital requests that they arrive at same time if possible.  Mercy Malena L Keanthony Poole, MHT/NS 

## 2014-02-16 NOTE — ED Notes (Signed)
MD informed patient needs potassium replacement prior to placement.

## 2014-02-16 NOTE — Progress Notes (Addendum)
MHT initiated psychiatric placement at the following facilities:  1)[facility]-acuity on unit too high; closed to admissions on 3/22 2)[facility]-faxed referral 3)[facility]-faxed referral 4)[facility]-no beds 5)[facility]-faxed referral 6)[facility]-closed to admission on 3/22; acuity too high on unit 7)[facility]-callback in afternoon on 3/23 8)[facility]-faxed referral 9)[facility]-faxed referral 10)[facility]-can not take adult medicaid only 11)[facility]-can not take adult Medicaid only 12)[facility]-faxed referral  Alexandra Gray L Azlin Zilberman, MHT/NS 

## 2014-02-16 NOTE — Progress Notes (Signed)
M[facility] and [facility].  Paperwork was confirmed reached and approved by [facility].  MHT left a message for transport about pt needing [facility].  Blain PaisMichelle L Mayson Sterbenz, MHT/NS

## 2014-02-16 NOTE — ED Notes (Signed)
Deputy notified patients that they were IVC and being transported to [facility] in [place], .   

## 2014-02-18 DIAGNOSIS — F322 Major depressive disorder, single episode, severe without psychotic features: Secondary | ICD-10-CM | POA: Insufficient documentation

## 2014-04-28 ENCOUNTER — Emergency Department (HOSPITAL_COMMUNITY)
Admission: EM | Admit: 2014-04-28 | Discharge: 2014-04-28 | Disposition: A | Discharge status: Home / Self Care | Payer: Medicaid Other | Attending: Emergency Medicine | Admitting: Emergency Medicine

## 2014-04-28 ENCOUNTER — Emergency Department (HOSPITAL_COMMUNITY): Payer: Medicaid Other

## 2014-04-28 ENCOUNTER — Encounter (HOSPITAL_COMMUNITY): Payer: Self-pay | Admitting: Emergency Medicine

## 2014-04-28 DIAGNOSIS — Y939 Activity, unspecified: Secondary | ICD-10-CM | POA: Insufficient documentation

## 2014-04-28 DIAGNOSIS — M25569 Pain in unspecified knee: Secondary | ICD-10-CM | POA: Insufficient documentation

## 2014-04-28 DIAGNOSIS — F489 Nonpsychotic mental disorder, unspecified: Secondary | ICD-10-CM | POA: Insufficient documentation

## 2014-04-28 DIAGNOSIS — W19XXXA Unspecified fall, initial encounter: Secondary | ICD-10-CM | POA: Insufficient documentation

## 2014-04-28 DIAGNOSIS — R269 Unspecified abnormalities of gait and mobility: Secondary | ICD-10-CM | POA: Insufficient documentation

## 2014-04-28 DIAGNOSIS — Y929 Unspecified place or not applicable: Secondary | ICD-10-CM | POA: Insufficient documentation

## 2014-04-28 DIAGNOSIS — G4730 Sleep apnea, unspecified: Secondary | ICD-10-CM | POA: Insufficient documentation

## 2014-04-28 DIAGNOSIS — M25579 Pain in unspecified ankle and joints of unspecified foot: Secondary | ICD-10-CM | POA: Insufficient documentation

## 2014-04-28 DIAGNOSIS — Z79899 Other long term (current) drug therapy: Secondary | ICD-10-CM | POA: Insufficient documentation

## 2014-04-28 IMAGING — CR DG KNEE COMPLETE 4+V*L*
4 series · 4 of 4 positions shown · non-contrast
Comparison: None.

CLINICAL DATA: Constant left leg and knee pain.  Fall 2 weeks ago.

EXAM:
LEFT KNEE - COMPLETE 4+ VIEW

[t knee ap left]
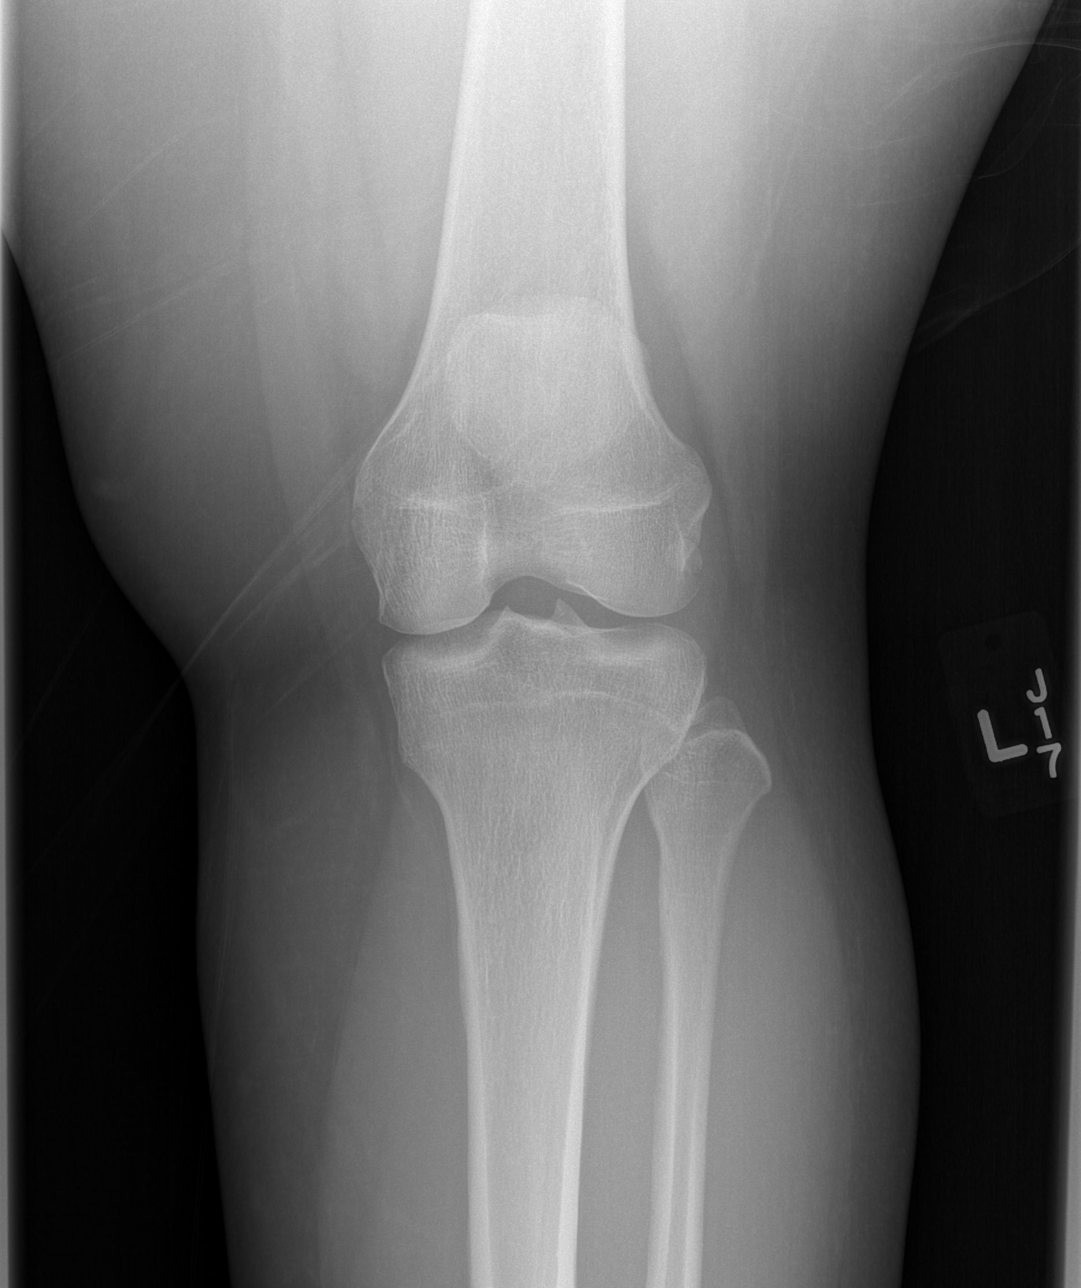

[t knee oblique left (1 of 2)]
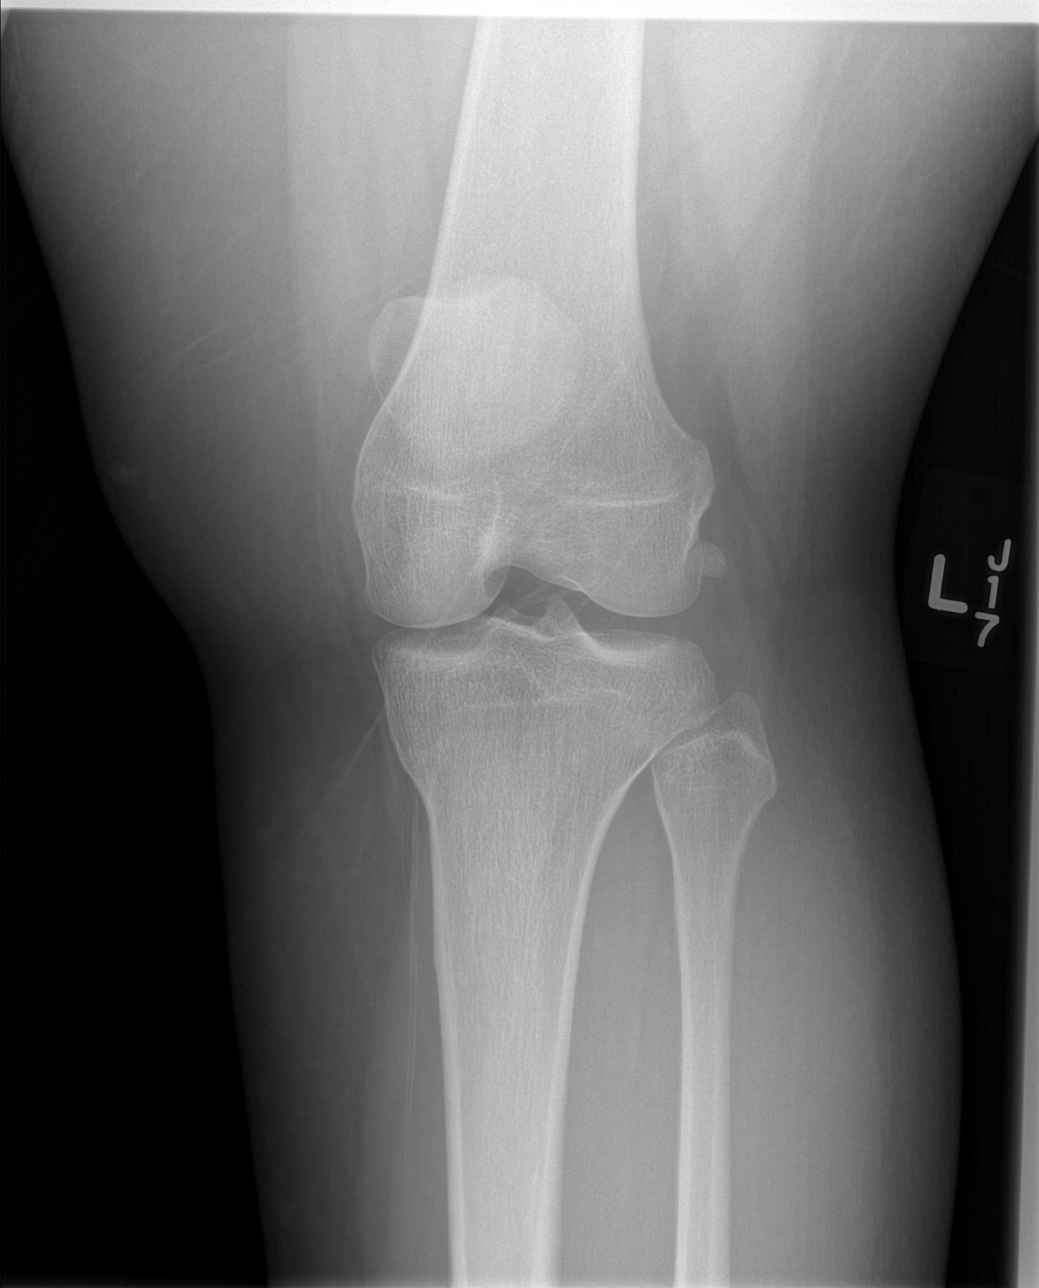

[t knee oblique left (2 of 2)]
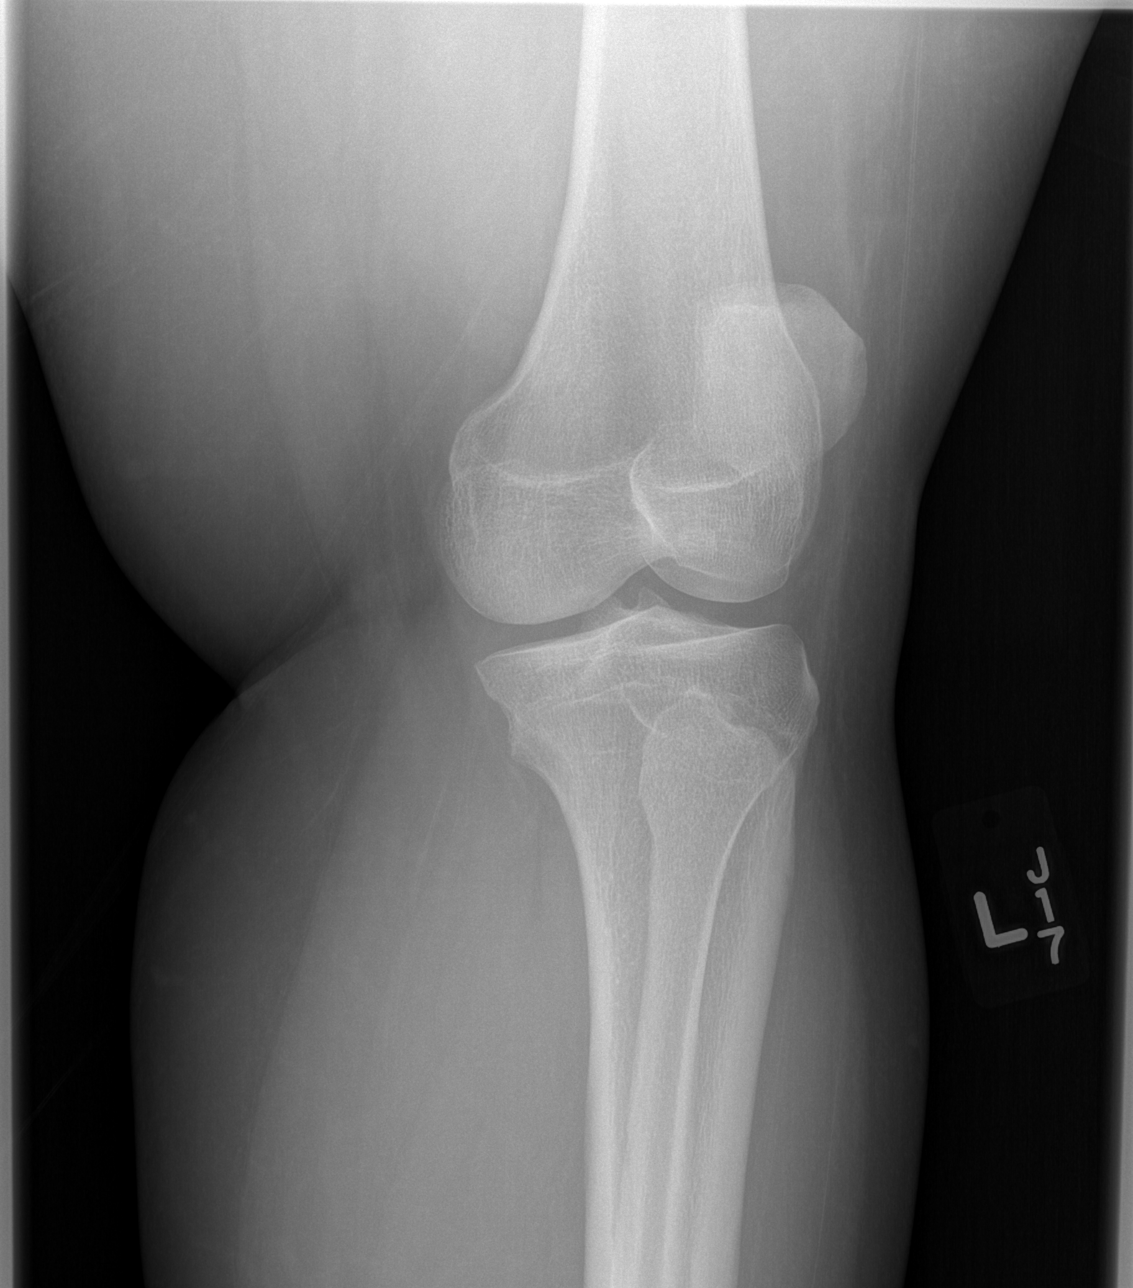

[t knee lat left]
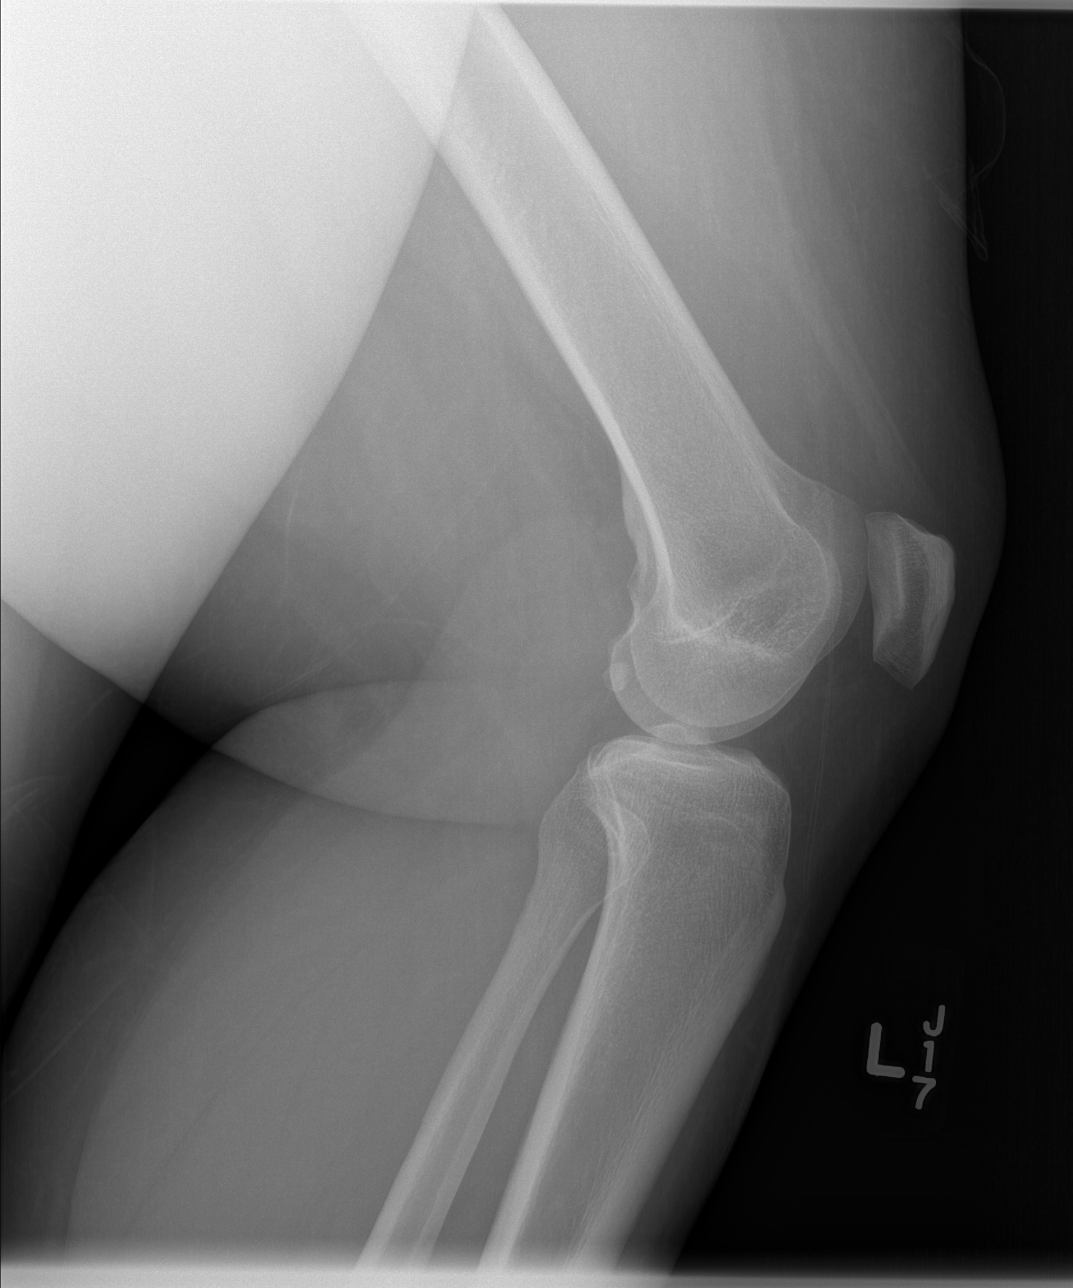

[4 of 4 positions shown; findings below may reference images not displayed]

FINDINGS: There is no evidence of fracture, dislocation, or joint effusion.
There is no evidence of arthropathy or other focal bone abnormality.
Soft tissues are unremarkable.
IMPRESSION: Negative.

## 2014-04-28 MED ORDER — NAPROXEN 500 MG PO TABS: 500.0000 mg | ORAL_TABLET | Freq: Two times a day (BID) | ORAL | Status: DC

## 2014-04-28 MED ORDER — TRAMADOL HCL 50 MG PO TABS
50.0000 mg | ORAL_TABLET | Freq: Once | ORAL | Status: AC
  Administered 2014-04-28: 50 mg via ORAL
  Filled 2014-04-28: qty 1

## 2014-04-28 MED ORDER — TRAMADOL HCL 50 MG PO TABS: 50.0000 mg | ORAL_TABLET | Freq: Four times a day (QID) | ORAL | Status: DC | PRN

## 2014-04-28 NOTE — Discharge Instructions (Signed)
Please read and follow all provided instructions.  Your diagnoses today include:  1. Knee pain   2. Ankle pain     Tests performed today include:  An x-ray of the affected areas - do NOT show any broken bones  Vital signs. See below for your results today.   Medications prescribed:   Tramadol - narcotic-like pain medication  DO NOT drive or perform any activities that require you to be awake and alert because this medicine can make you drowsy.    Naproxen - anti-inflammatory pain medication  Do not exceed 500mg  naproxen every 12 hours, take with food  You have been prescribed an anti-inflammatory medication or NSAID. Take with food. Take smallest effective dose for the shortest duration needed for your pain. Stop taking if you experience stomach pain or vomiting.   Take any prescribed medications only as directed.  Home care instructions:   Follow any educational materials contained in this packet  Follow R.I.C.E. Protocol:  R - rest your injury   I  - use ice on injury without applying directly to skin  C - compress injury with bandage or splint  E - elevate the injury as much as possible  Follow-up instructions: Please follow-up with your orthopedic physician (bone specialist).    Return instructions:   Please return if your toes are numb or tingling, appear gray or blue, or you have severe pain (also elevate leg and loosen splint or wrap if you were given one)  Please return to the Emergency Department if you experience worsening symptoms.   Please return if you have any other emergent concerns.  Additional Information:  Your vital signs today were: BP 127/74   Pulse 102   Temp(Src) 98.4 F (36.9 C) (Oral)   Resp 18   Wt 249 lb 1 oz (112.974 kg)   SpO2 98%   LMP 04/20/2014 If your blood pressure (BP) was elevated above 135/85 this visit, please have this repeated by your doctor within one month. -------------- If prescribed crutches for your injury: use  crutches with non-weight bearing for the first few days. Then, you may walk as the pain allows, or as instructed. Start gradually with weight bearing on the affected side. Once you can walk pain free, then try jogging. When you can run forwards, then you can try moving side-to-side. If you cannot walk without crutches in one week, you need a re-check. --------------

## 2014-04-28 NOTE — ED Notes (Signed)
Pt. reports pain at left lower leg for 2 weeks after her left knee " gave out " and fell 2 weeks ago, ambulatory .

## 2014-04-28 NOTE — ED Provider Notes (Signed)
CSN: [id number]     Arrival date & time 04/28/14  1945 History  This chart was scribed for non-physician practitioner, Renne CriglerJoshua Anaika Santillano, PA-C, working with Gilda Creasehristopher J. Pollina, MD by Shari HeritageAisha Amuda, ED Scribe. This patient was seen in room TR05C/TR05C and the patient's care was started at 8:48 PM.  Chief Complaint  Patient presents with  . Leg Pain      The history is provided by the patient. No language interpreter was used.    HPI Comments: Alexandra Gray is a 23 y.o. female who presents to the Emergency Department complaining of constant left leg pain from the knee to foot onset 2 weeks ago resulting from a fall. Patient states that her leg "gave out" and she fell twisting her knee. She began having pain immediately after the fall. Pain is worse with weight bearing and ambulation. She states that she has been limping while she walks secondary to pain. Patient has tried taking ibuprofen and warm showers without pain relief. [facility] past. Patient does not smoke.   Past Medical History  Diagnosis Date  . Mental disorder   . Depression   . Sleep apnea    Past Surgical History  Procedure Laterality Date  . Knee surgery     No family history on file. History  Substance Use Topics  . Smoking status: Never Smoker   . Smokeless tobacco: Not on file  . Alcohol Use: No   OB History   Grav Para Term Preterm Abortions TAB SAB Ect Mult Living                 Review of Systems  Constitutional: Negative for fever and chills.  Gastrointestinal: Negative for nausea and vomiting.  Musculoskeletal: Positive for arthralgias (left lower leg) and gait problem. Negative for back pain, joint swelling and neck pain.  Neurological: Negative for weakness and numbness.    Allergies  Review of patient's allergies indicates no known allergies.  Home Medications   Prior to Admission medications   Medication Sig Start Date End Date Taking? Authorizing Provider   DULoxetine (CYMBALTA) 60 MG capsule Take 60 mg by mouth daily.   Yes Historical Provider, MD  traZODone (DESYREL) 100 MG tablet Take 100 mg by mouth at bedtime.   Yes Historical Provider, MD  naproxen (NAPROSYN) 500 MG tablet Take 1 tablet (500 mg total) by mouth 2 (two) times daily. 04/28/14   Renne CriglerJoshua Dalan Cowger, PA-C  traMADol (ULTRAM) 50 MG tablet Take 1 tablet (50 mg total) by mouth every 6 (six) hours as needed. 04/28/14   Renne CriglerJoshua Tydus Sanmiguel, PA-C   Physical Exam  Nursing note and vitals reviewed. Constitutional: She appears well-developed and well-nourished. No distress.  HENT:  Head: Normocephalic and atraumatic.  Eyes: Conjunctivae and EOM are normal.  Neck: Normal range of motion. No tracheal deviation present.  Cardiovascular: Normal rate.   Pulses:      Dorsalis pedis pulses are 2+ on the right side, and 2+ on the left side.  Pulmonary/Chest: Effort normal. No respiratory distress.  Musculoskeletal: She exhibits tenderness.  Medial joint line tenderness of the left knee. No effusion. Pain with flexion. Ankle pain posterior to the malleoli with some trace swelling. Normal sensation. Normal DP pulse on the left.  Neurological: She is alert.  Skin: Skin is warm and dry.  Psychiatric: She has a normal mood and affect. Her behavior is normal.    ED Course  Procedures (including critical care time) DIAGNOSTIC STUDIES:  Oxygen Saturation is 98% on room air, normal by my interpretation.    COORDINATION OF CARE: 8:55 PM- Patient informed of current plan for treatment and evaluation and agrees with plan at this time.   BP 127/74  Pulse 102  Temp(Src) 98.4 F (36.9 C) (Oral)  Resp 18  Wt 249 lb 1 oz (112.974 kg)  SpO2 98%  LMP 04/20/2014   Imaging Review Dg Ankle Complete Left  04/28/2014   CLINICAL DATA:  Left leg pain  EXAM: LEFT ANKLE COMPLETE - 3+ VIEW  COMPARISON:  None.  FINDINGS: No acute fracture.  No dislocation.  IMPRESSION: No acute bony pathology.   Electronically Signed    By: Maryclare Bean M.D.   On: 04/28/2014 21:36   Dg Knee Complete 4 Views Left  04/28/2014   CLINICAL DATA:  Constant left leg and knee pain.  Fall 2 weeks ago.  EXAM: LEFT KNEE - COMPLETE 4+ VIEW  COMPARISON:  None.  FINDINGS: There is no evidence of fracture, dislocation, or joint effusion. There is no evidence of arthropathy or other focal bone abnormality. Soft tissues are unremarkable.  IMPRESSION: Negative.   Electronically Signed   By: Amie Portland M.D.   On: 04/28/2014 21:35    Patient was counseled on RICE protocol and told to rest injury, use ice for no longer than 15 minutes every hour, compress the area, and elevate above the level of their heart as much as possible to reduce swelling. Questions answered. Patient verbalized understanding.    Patient counseled on use of narcotic pain medications. Counseled not to combine these medications with others containing tylenol. Urged not to drink alcohol, drive, or perform any other activities that requires focus while taking these medications. The patient verbalizes understanding and agrees with the plan.  MDM   Final diagnoses:  Knee pain  Ankle pain   Pain in knee and ankle to begin 2 weeks ago after a fall. Patient is ambulatory with pain. The leg is neurovascularly intact. X-rays are negative. Patient has orthopedic followup. Patient given Ace wrap, ASO, crutches for comfort. No concern for compartment syndrome.  I personally performed the services described in this documentation, which was scribed in my presence. The recorded information has been reviewed and is accurate.    Renne Crigler, PA-C 04/28/14 2239

## 2014-04-28 NOTE — ED Notes (Signed)
Pt allowed this RN for pt to be placed in wheelchair and wheeled out to front.

## 2014-04-29 NOTE — ED Provider Notes (Signed)
Medical screening examination/treatment/procedure(s) were performed by non-physician practitioner and as supervising physician I was immediately available for consultation/collaboration.  Christopher J. Pollina, MD 04/29/14 0110 

## 2014-07-13 ENCOUNTER — Encounter (HOSPITAL_COMMUNITY): Payer: Self-pay | Admitting: Emergency Medicine

## 2014-07-13 ENCOUNTER — Emergency Department (HOSPITAL_COMMUNITY)
Admission: EM | Admit: 2014-07-13 | Discharge: 2014-07-13 | Disposition: A | Discharge status: Home / Self Care | Payer: Medicaid Other | Attending: Emergency Medicine | Admitting: Emergency Medicine

## 2014-07-13 DIAGNOSIS — R1030 Lower abdominal pain, unspecified: Secondary | ICD-10-CM

## 2014-07-13 DIAGNOSIS — Z791 Long term (current) use of non-steroidal anti-inflammatories (NSAID): Secondary | ICD-10-CM | POA: Diagnosis not present

## 2014-07-13 DIAGNOSIS — R5381 Other malaise: Secondary | ICD-10-CM | POA: Diagnosis not present

## 2014-07-13 DIAGNOSIS — A5900 Urogenital trichomoniasis, unspecified: Secondary | ICD-10-CM | POA: Insufficient documentation

## 2014-07-13 DIAGNOSIS — Z79899 Other long term (current) drug therapy: Secondary | ICD-10-CM | POA: Insufficient documentation

## 2014-07-13 DIAGNOSIS — F329 Major depressive disorder, single episode, unspecified: Secondary | ICD-10-CM | POA: Diagnosis not present

## 2014-07-13 DIAGNOSIS — Z792 Long term (current) use of antibiotics: Secondary | ICD-10-CM | POA: Diagnosis not present

## 2014-07-13 DIAGNOSIS — R5383 Other fatigue: Secondary | ICD-10-CM

## 2014-07-13 DIAGNOSIS — Z3202 Encounter for pregnancy test, result negative: Secondary | ICD-10-CM | POA: Insufficient documentation

## 2014-07-13 DIAGNOSIS — R079 Chest pain, unspecified: Secondary | ICD-10-CM | POA: Diagnosis present

## 2014-07-13 DIAGNOSIS — N949 Unspecified condition associated with female genital organs and menstrual cycle: Secondary | ICD-10-CM

## 2014-07-13 DIAGNOSIS — E669 Obesity, unspecified: Secondary | ICD-10-CM | POA: Diagnosis not present

## 2014-07-13 DIAGNOSIS — R109 Unspecified abdominal pain: Secondary | ICD-10-CM | POA: Diagnosis not present

## 2014-07-13 DIAGNOSIS — N739 Female pelvic inflammatory disease, unspecified: Secondary | ICD-10-CM | POA: Insufficient documentation

## 2014-07-13 DIAGNOSIS — F3289 Other specified depressive episodes: Secondary | ICD-10-CM | POA: Insufficient documentation

## 2014-07-13 DIAGNOSIS — N390 Urinary tract infection, site not specified: Secondary | ICD-10-CM | POA: Insufficient documentation

## 2014-07-13 DIAGNOSIS — R609 Edema, unspecified: Secondary | ICD-10-CM | POA: Insufficient documentation

## 2014-07-13 DIAGNOSIS — N730 Acute parametritis and pelvic cellulitis: Secondary | ICD-10-CM

## 2014-07-13 LAB — COMPREHENSIVE METABOLIC PANEL
ALBUMIN: 3.6 g/dL (ref 3.5–5.2)
ALT: 20 U/L (ref 0–35)
ANION GAP: 13 (ref 5–15)
AST: 18 U/L (ref 0–37)
Alkaline Phosphatase: 85 U/L (ref 39–117)
BUN: 7 mg/dL (ref 6–23)
CALCIUM: 9.2 mg/dL (ref 8.4–10.5)
CO2: 26 mEq/L (ref 19–32)
Chloride: 105 mEq/L (ref 96–112)
Creatinine, Ser: 0.67 mg/dL (ref 0.50–1.10)
GFR calc non Af Amer: >90 mL/min (ref >90)
GLUCOSE: 82 mg/dL (ref 70–99)
Potassium: 3.9 mEq/L (ref 3.7–5.3)
SODIUM: 144 meq/L (ref 137–147)
Total Bilirubin: 0.2 mg/dL — ABNORMAL LOW (ref 0.3–1.2)
Total Protein: 7.5 g/dL (ref 6.0–8.3)

## 2014-07-13 LAB — HIV ANTIBODY (ROUTINE TESTING W REFLEX): HIV: NONREACTIVE

## 2014-07-13 LAB — URINE MICROSCOPIC-ADD ON

## 2014-07-13 LAB — CBC WITH DIFFERENTIAL/PLATELET
Basophils Absolute: 0 10*3/uL (ref 0.0–0.1)
Basophils Relative: 0 % (ref 0–1)
Eosinophils Absolute: 0.3 10*3/uL (ref 0.0–0.7)
Eosinophils Relative: 3 % (ref 0–5)
HCT: 40.6 % (ref 36.0–46.0)
HEMOGLOBIN: 13.2 g/dL (ref 12.0–15.0)
Lymphocytes Relative: 24 % (ref 12–46)
Lymphs Abs: 2.6 10*3/uL (ref 0.7–4.0)
MCH: 27.2 pg (ref 26.0–34.0)
MCHC: 32.5 g/dL (ref 30.0–36.0)
MCV: 83.7 fL (ref 78.0–100.0)
MONOS PCT: 6 % (ref 3–12)
Monocytes Absolute: 0.6 10*3/uL (ref 0.1–1.0)
NEUTROS ABS: 7.2 10*3/uL (ref 1.7–7.7)
NEUTROS PCT: 67 % (ref 43–77)
Platelets: 292 10*3/uL (ref 150–400)
RBC: 4.85 MIL/uL (ref 3.87–5.11)
RDW: 14.2 % (ref 11.5–15.5)
WBC: 10.8 10*3/uL — ABNORMAL HIGH (ref 4.0–10.5)

## 2014-07-13 LAB — URINALYSIS, ROUTINE W REFLEX MICROSCOPIC
BILIRUBIN URINE: NEGATIVE
Glucose, UA: NEGATIVE mg/dL
Hgb urine dipstick: NEGATIVE
Ketones, ur: NEGATIVE mg/dL
NITRITE: NEGATIVE
Protein, ur: NEGATIVE mg/dL
SPECIFIC GRAVITY, URINE: 1.014 (ref 1.005–1.030)
UROBILINOGEN UA: 1 mg/dL (ref 0.0–1.0)
pH: 6.5 (ref 5.0–8.0)

## 2014-07-13 LAB — WET PREP, GENITAL
Clue Cells Wet Prep HPF POC: NONE SEEN
YEAST WET PREP: NONE SEEN

## 2014-07-13 LAB — D-DIMER, QUANTITATIVE (NOT AT ARMC): D DIMER QUANT: 0.46 ug{FEU}/mL (ref 0.00–0.48)

## 2014-07-13 LAB — POC URINE PREG, ED: PREG TEST UR: NEGATIVE

## 2014-07-13 LAB — RPR

## 2014-07-13 LAB — LIPASE, BLOOD: Lipase: 16 U/L (ref 11–59)

## 2014-07-13 MED ORDER — SULFAMETHOXAZOLE-TMP DS 800-160 MG PO TABS: 1.0000 | ORAL_TABLET | Freq: Two times a day (BID) | ORAL | Status: DC

## 2014-07-13 MED ORDER — CEFTRIAXONE SODIUM 250 MG IJ SOLR
250.0000 mg | Freq: Once | INTRAMUSCULAR | Status: AC
  Administered 2014-07-13: 250 mg via INTRAMUSCULAR
  Filled 2014-07-13: qty 250

## 2014-07-13 MED ORDER — METRONIDAZOLE 500 MG PO TABS
2000.0000 mg | ORAL_TABLET | Freq: Once | ORAL | Status: AC
  Administered 2014-07-13: 2000 mg via ORAL
  Filled 2014-07-13: qty 4

## 2014-07-13 MED ORDER — AZITHROMYCIN 250 MG PO TABS
1000.0000 mg | ORAL_TABLET | Freq: Once | ORAL | Status: AC
  Administered 2014-07-13: 1000 mg via ORAL
  Filled 2014-07-13: qty 4

## 2014-07-13 MED ORDER — LIDOCAINE HCL (PF) 1 % IJ SOLN
INTRAMUSCULAR | Status: AC
  Administered 2014-07-13: 5 mL
  Filled 2014-07-13: qty 5

## 2014-07-13 MED ORDER — DOXYCYCLINE HYCLATE 100 MG PO CAPS: 100.0000 mg | ORAL_CAPSULE | Freq: Two times a day (BID) | ORAL | Status: DC

## 2014-07-13 MED ORDER — NAPROXEN 500 MG PO TABS: 500.0000 mg | ORAL_TABLET | Freq: Two times a day (BID) | ORAL | Status: DC | PRN

## 2014-07-13 MED ORDER — HYDROCODONE-ACETAMINOPHEN 5-325 MG PO TABS: 1.0000 | ORAL_TABLET | Freq: Four times a day (QID) | ORAL | Status: DC | PRN

## 2014-07-13 NOTE — ED Notes (Signed)
Pt. Reports medial low back pain, lower abdominal pain, urinary frequency, chest pressure, SOB, lightheadedness, nausea, and bilateral feet swelling x1 week.

## 2014-07-13 NOTE — ED Notes (Signed)
Pelvic setup at bedside.

## 2014-07-13 NOTE — ED Provider Notes (Signed)
CSN: [id number]     Arrival date & time 07/13/14  1148 History   First MD Initiated Contact with Patient 07/13/14 1208     Chief Complaint  Patient presents with  . Chest Pain  . Generalized Body Aches     (Consider location/radiation/quality/duration/timing/severity/associated sxs/prior Treatment) HPI Comments: Alexandra Gray is a 23 y.o. Female with a PMHx of depression and sleep apnea, who presents to the ED today complaining of 1 week of multiple complaints including lower abd pain, lightheadedness, nausea, b/l lower leg swelling and claudication/tingling when walking. She states the abd pain is in the suprapubic area, "stabbing like a knot", 9/10, constant, nonradiating, with no known aggravating or alleviating factors. She has not tried anything for this issue. Not related to food intake or urination but has had slight increase in urinary frequency. States in addition she's had some intermittent nausea, lightheadedness with standing and walking, leg swelling and feet tingling when walking which resolves fully once at rest or with elevation. States that she gets winded when walking, but denies CP or SOB. Denies fevers, chills, HA, vision changes, CP, SOB, back pain, weakness, numbness, malodorous urine, hematuria, dysuria, vomiting, diarrhea, constipation, vaginal discharge/bleeding, syncope, vertigo, or unilateral paresis. Denies LE skin changes or unilateral swelling more over the other leg. Denies hx of DVT/PE, denies estrogen use, denies recent travel or immobility/surgery. Denies DM hx. Denies incontinence of urine or stool or any cauda equina symptoms. Nonsmoker.  Patient is a 23 y.o. female presenting with leg pain. The history is provided by the patient. No language interpreter was used.  Leg Pain Location:  Leg Time since incident:  1 week Injury: no   Leg location:  L lower leg and R lower leg Pain details:    Quality:  Cramping and tingling   Radiates to:  Does not radiate  Severity:  Mild   Onset quality:  Gradual   Duration:  1 week   Timing:  Intermittent   Progression:  Unchanged Chronicity:  New Dislocation: no   Prior injury to area:  No Relieved by:  Rest and elevation Worsened by:  Activity and exercise Ineffective treatments:  None tried Associated symptoms: muscle weakness (when walking), swelling (with ambulation) and tingling (when walking)   Associated symptoms: no back pain, no decreased ROM, no fever, no neck pain, no numbness and no stiffness   Risk factors: obesity     Past Medical History  Diagnosis Date  . Mental disorder   . Depression   . Sleep apnea    Past Surgical History  Procedure Laterality Date  . Knee surgery     No family history on file. History  Substance Use Topics  . Smoking status: Never Smoker   . Smokeless tobacco: Not on file  . Alcohol Use: No   OB History   Grav Para Term Preterm Abortions TAB SAB Ect Mult Living                 Review of Systems  Constitutional: Negative for fever, chills and diaphoresis.  Eyes: Negative for visual disturbance.  Respiratory: Negative for cough, chest tightness and shortness of breath.   Cardiovascular: Positive for chest pain and leg swelling. Negative for palpitations.  Gastrointestinal: Positive for nausea. Negative for vomiting, abdominal pain, diarrhea, constipation and abdominal distention.  Genitourinary: Positive for frequency. Negative for dysuria, urgency, hematuria, flank pain, decreased urine volume, vaginal bleeding, vaginal discharge, difficulty urinating and vaginal pain.  Musculoskeletal: Negative for back pain, gait problem,  joint swelling, neck pain, neck stiffness and stiffness.  Skin: Negative for color change and rash.  Neurological: Positive for light-headedness. Negative for dizziness, syncope, facial asymmetry, weakness, numbness and headaches.  Psychiatric/Behavioral: Negative for confusion.  10 Systems reviewed and are negative for acute  change except as noted in the HPI.     Allergies  Review of patient's allergies indicates no known allergies.  Home Medications   Prior to Admission medications   Medication Sig Start Date End Date Taking? Authorizing Provider  DULoxetine (CYMBALTA) 60 MG capsule Take 60 mg by mouth daily.   Yes Historical Provider, MD  traMADol (ULTRAM) 50 MG tablet Take 50 mg by mouth every 6 (six) hours as needed.   Yes Historical Provider, MD  traZODone (DESYREL) 100 MG tablet Take 100 mg by mouth at bedtime as needed for sleep.    Yes Historical Provider, MD  doxycycline (VIBRAMYCIN) 100 MG capsule Take 1 capsule (100 mg total) by mouth 2 (two) times daily. One po bid x 14 days 07/13/14   Ziaire Hagos Strupp Camprubi-Soms, PA-C  HYDROcodone-acetaminophen (NORCO) 5-325 MG per tablet Take 1-2 tablets by mouth every 6 (six) hours as needed for severe pain. 07/13/14   Magnus Crescenzo Strupp Camprubi-Soms, PA-C  naproxen (NAPROSYN) 500 MG tablet Take 1 tablet (500 mg total) by mouth 2 (two) times daily as needed for mild pain, moderate pain or headache (TAKE WITH MEALS.). 07/13/14   Rayetta Veith Strupp Camprubi-Soms, PA-C  sulfamethoxazole-trimethoprim (BACTRIM DS) 800-160 MG per tablet Take 1 tablet by mouth 2 (two) times daily. 07/13/14   Shontell Prosser Strupp Camprubi-Soms, PA-C   BP 125/68  Pulse 91  Temp(Src) 99 F (37.2 C) (Oral)  Resp 21  SpO2 100%  LMP 06/18/2014 Physical Exam  Nursing note and vitals reviewed. Constitutional: She is oriented to person, place, and time. Vital signs are normal. She appears well-developed and well-nourished. No distress.  Afebrile, nontoxic  HENT:  Head: Normocephalic and atraumatic.  Mouth/Throat: Oropharynx is clear and moist and mucous membranes are normal.  Eyes: Conjunctivae and EOM are normal. Right eye exhibits no discharge. Left eye exhibits no discharge.  Neck: Normal range of motion. Neck supple.  Cardiovascular: Normal rate, regular rhythm, normal heart sounds and intact  distal pulses.  Exam reveals no gallop and no friction rub.   No murmur heard. Distal pulses intact  Pulmonary/Chest: Effort normal and breath sounds normal. No respiratory distress. She has no decreased breath sounds. She has no wheezes. She has no rhonchi. She has no rales.  Abdominal: Soft. Normal appearance and bowel sounds are normal. She exhibits no distension. There is tenderness in the suprapubic area. There is no rigidity, no rebound, no guarding, no CVA tenderness, no tenderness at McBurney's point and negative Murphy's sign.  Obese, soft, nondistended, with suprapubic TTP but no r/g/r. Neg murphy's, neg mcburney's, neg CVA TTP.   Genitourinary: Uterus normal. Pelvic exam was performed with patient supine. There is no rash, tenderness or lesion on the right labia. There is no rash, tenderness or lesion on the left labia. Cervix exhibits motion tenderness and discharge. Cervix exhibits no friability. Right adnexum displays no mass, no tenderness and no fullness. Left adnexum displays no mass, no tenderness and no fullness. No erythema, tenderness or bleeding around the vagina. No foreign body around the vagina. Vaginal discharge found.  No rashes, lesions, or tenderness to external genitalia. No erythema, injury, or tenderness to vaginal mucosa. Copious amounts of mucopurulent vaginal discharge within vaginal vault. No bleeding within vaginal vault.  No adnexal masses, tenderness, or fullness. +CMT with mucopurulent discharge oozing from cervical os. No cervical friability. Uterus non-deviated, mobile, nonTTP, and without enlargement.   Musculoskeletal: Normal range of motion.  Gait nonataxic, moving all extremities with ease  Lymphadenopathy:       Right: No inguinal adenopathy present.       Left: No inguinal adenopathy present.  Neurological: She is alert and oriented to person, place, and time. She has normal strength. No sensory deficit. Coordination and gait normal.  Sensation grossly  intact in all extremities, strength 5/5 in all extremities, gait nonataxic, no cerebellar signs  Skin: Skin is warm, dry and intact. No rash noted.  Trace pretibial edema up to mid-calf. Distal pulses intact. No skin discolorations of BLEs  Psychiatric: She has a normal mood and affect.    ED Course  Procedures (including critical care time) Labs Review Labs Reviewed  WET PREP, GENITAL - Abnormal; Notable for the following:    Trich, Wet Prep FEW (*)    WBC, Wet Prep HPF POC MANY (*)    All other components within normal limits  URINALYSIS, ROUTINE W REFLEX MICROSCOPIC - Abnormal; Notable for the following:    APPearance CLOUDY (*)    Leukocytes, UA MODERATE (*)    All other components within normal limits  COMPREHENSIVE METABOLIC PANEL - Abnormal; Notable for the following:    Total Bilirubin 0.2 (*)    All other components within normal limits  CBC WITH DIFFERENTIAL - Abnormal; Notable for the following:    WBC 10.8 (*)    All other components within normal limits  URINE MICROSCOPIC-ADD ON - Abnormal; Notable for the following:    Squamous Epithelial / LPF FEW (*)    All other components within normal limits  GC/CHLAMYDIA PROBE AMP  URINE CULTURE  LIPASE, BLOOD  D-DIMER, QUANTITATIVE  RPR  HIV ANTIBODY (ROUTINE TESTING)  POC URINE PREG, ED    Imaging Review No results found.   EKG Interpretation None      MDM   Final diagnoses:  Lower abdominal pain  Malaise  Obesity  Peripheral edema  Trichomoniasis, urogenital  UTI (lower urinary tract infection)  Cervical motion tenderness  PID (acute pelvic inflammatory disease)     23y/o with multiple complaints, primarily lightheadedness and urinary frequency with suprapubic abd pain and intermittent BLE swelling and claudication. Will get orthostatics, U/A, labs, pelvic and STD panel, and d-dimer. DDx wide at the moment, but includes UTI, vaginal/pelvic infection, dehydration, periph vasc disease, DVT. Will reassess  shortly. Denies any CP or SOB now.  3:27 PM Orthostatics negative. CMP WNL, CBC w/diff showing WBC 10.8 which is unremarkable. Lipase WNL, d-dimer neg. Upreg neg. U/A demonstrates possible UTI with moderate leuks, 11-20 WBC, but rare bacteria and +trich. Could be trich causing changes, but will still treat given that pt has several complaints and do not want to miss any infections. Will send for culture. Wet prep pending. Will empirically tx for GC/CT with azithro/rocephin, and give metro 2g today for trich. Due to CMT on exam, will start Doxy for PID. Pt tolerating PO intake now.  4:44 PM Wet prep showing trich. Will d/c with doxy for PID and bactrim for UTI. Rx for norco and naprosyn given for pain. Discussed use of compression stockings and elevation to help with dependent edema, and weight loss. Her symptoms with exertion are likely related to deconditioning, and she needs to lose weight to help with some of these. Pt continued to deny  CP or SOB during ED stay. Did not request pain medication or want any when offered because she states her pain is only when she walks a lot. I explained the diagnosis and have given explicit precautions to return to the ER including for any other new or worsening symptoms. The patient understands and accepts the medical plan as it's been dictated and I have answered their questions. Discharge instructions concerning home care and prescriptions have been given. The patient is STABLE and is discharged to home in good condition.  BP 125/68  Pulse 91  Temp(Src) 98.4 F (36.9 C) (Rectal)  Resp 21  SpO2 100%  LMP 06/18/2014  Meds ordered this encounter  Medications  . azithromycin (ZITHROMAX) tablet 1,000 mg    Sig:   . cefTRIAXone (ROCEPHIN) injection 250 mg    Sig:     Order Specific Question:  Antibiotic Indication:    Answer:  STD  . metroNIDAZOLE (FLAGYL) tablet 2,000 mg    Sig:   . lidocaine (PF) (XYLOCAINE) 1 % injection    Sig:     Oberthaler, Jody    : cabinet override  . HYDROcodone-acetaminophen (NORCO) 5-325 MG per tablet    Sig: Take 1-2 tablets by mouth every 6 (six) hours as needed for severe pain.    Dispense:  6 tablet    Refill:  0    Order Specific Question:  Supervising Provider    Answer:  Eber Hong D [[id number]]  . naproxen (NAPROSYN) 500 MG tablet    Sig: Take 1 tablet (500 mg total) by mouth 2 (two) times daily as needed for mild pain, moderate pain or headache (TAKE WITH MEALS.).    Dispense:  20 tablet    Refill:  0    Order Specific Question:  Supervising Provider    Answer:  Eber Hong D [[id number]]  . sulfamethoxazole-trimethoprim (BACTRIM DS) 800-160 MG per tablet    Sig: Take 1 tablet by mouth 2 (two) times daily.    Dispense:  14 tablet    Refill:  0    Order Specific Question:  Supervising Provider    Answer:  Eber Hong D [[id number]]  . doxycycline (VIBRAMYCIN) 100 MG capsule    Sig: Take 1 capsule (100 mg total) by mouth 2 (two) times daily. One po bid x 14 days    Dispense:  28 capsule    Refill:  0    Order Specific Question:  Supervising Provider    Answer:  Vida Roller [address] Camprubi-Soms, PA-C 07/13/14 1659

## 2014-07-13 NOTE — ED Notes (Signed)
Pt. Ambulated to restroom with steady gait. Reports increased lightheadedness with standing and ambulation

## 2014-07-13 NOTE — ED Provider Notes (Signed)
Medical screening examination/treatment/procedure(s) were performed by non-physician practitioner and as supervising physician I was immediately available for consultation/collaboration.   EKG Interpretation None      Banjamin Stovall, MD, FACEP   Deliah Strehlow L Haelee Bolen, MD 07/13/14 1707 

## 2014-07-13 NOTE — Discharge Instructions (Signed)
Your vaginal exam was concerning for a pelvic infection, therefore you were treated for gonorrhea, chlamydia, and trichomonas today. The lab will call you if your gonorrhea or chlamydia test was positive, and you will need to take Doxycycline twice daily for 14 days to help continue treating the infection. Stay out of the sun while taking this medication. You were tested for HIV and Syphilis, and the hospital will call you if the lab is positive. Your urine showed signs of infection. Stay very well hydrated with plenty of water throughout the day. Take antibiotic until completed. Follow up with women's outpatient clinic in 1 week for recheck of ongoing symptoms but return to ER for emergent changing or worsening of symptoms. Please seek immediate care if you develop the following: You develop back pain.  Your symptoms are no better, or worse in 3 days. There is severe back pain or lower abdominal pain.  You develop chills.  You have a fever.  There is nausea or vomiting.  There is continued burning or discomfort with urination.   As for your leg swelling, this is probably related to strain on your blood vessels with exercise. Elevate your legs to help relieve this swelling, and use compression stockings as needed to help.   Abdominal Pain, Women Abdominal (stomach, pelvic, or belly) pain can be caused by many things. It is important to tell your doctor:  The location of the pain.  Does it come and go or is it present all the time?  Are there things that start the pain (eating certain foods, exercise)?  Are there other symptoms associated with the pain (fever, nausea, vomiting, diarrhea)? All of this is helpful to know when trying to find the cause of the pain. CAUSES   Stomach: virus or bacteria infection, or ulcer.  Intestine: appendicitis (inflamed appendix), regional ileitis (Crohn's disease), ulcerative colitis (inflamed colon), irritable bowel syndrome, diverticulitis (inflamed  diverticulum of the colon), or cancer of the stomach or intestine.  Gallbladder disease or stones in the gallbladder.  Kidney disease, kidney stones, or infection.  Pancreas infection or cancer.  Fibromyalgia (pain disorder).  Diseases of the female organs:  Uterus: fibroid (non-cancerous) tumors or infection.  Fallopian tubes: infection or tubal pregnancy.  Ovary: cysts or tumors.  Pelvic adhesions (scar tissue).  Endometriosis (uterus lining tissue growing in the pelvis and on the pelvic organs).  Pelvic congestion syndrome (female organs filling up with blood just before the menstrual period).  Pain with the menstrual period.  Pain with ovulation (producing an egg).  Pain with an IUD (intrauterine device, birth control) in the uterus.  Cancer of the female organs.  Functional pain (pain not caused by a disease, may improve without treatment).  Psychological pain.  Depression. DIAGNOSIS  Your doctor will decide the seriousness of your pain by doing an examination.  Blood tests.  X-rays.  Ultrasound.  CT scan (computed tomography, special type of X-ray).  MRI (magnetic resonance imaging).  Cultures, for infection.  Barium enema (dye inserted in the large intestine, to better view it with X-rays).  Colonoscopy (looking in intestine with a lighted tube).  Laparoscopy (minor surgery, looking in abdomen with a lighted tube).  Major abdominal exploratory surgery (looking in abdomen with a large incision). TREATMENT  The treatment will depend on the cause of the pain.   Many cases can be observed and treated at home.  Over-the-counter medicines recommended by your caregiver.  Prescription medicine.  Antibiotics, for infection.  Birth control pills, for  painful periods or for ovulation pain.  Hormone treatment, for endometriosis.  Nerve blocking injections.  Physical therapy.  Antidepressants.  Counseling with a psychologist or  psychiatrist.  Minor or major surgery. HOME CARE INSTRUCTIONS   Do not take laxatives, unless directed by your caregiver.  Take over-the-counter pain medicine only if ordered by your caregiver. Do not take aspirin because it can cause an upset stomach or bleeding.  Try a clear liquid diet (broth or water) as ordered by your caregiver. Slowly move to a bland diet, as tolerated, if the pain is related to the stomach or intestine.  Have a thermometer and take your temperature several times a day, and record it.  Bed rest and sleep, if it helps the pain.  Avoid sexual intercourse, if it causes pain.  Avoid stressful situations.  Keep your follow-up appointments and tests, as your caregiver orders.  If the pain does not go away with medicine or surgery, you may try:  Acupuncture.  Relaxation exercises (yoga, meditation).  Group therapy.  Counseling. SEEK MEDICAL CARE IF:   You notice certain foods cause stomach pain.  Your home care treatment is not helping your pain.  You need stronger pain medicine.  You want your IUD removed.  You feel faint or lightheaded.  You develop nausea and vomiting.  You develop a rash.  You are having side effects or an allergy to your medicine. SEEK IMMEDIATE MEDICAL CARE IF:   Your pain does not go away or gets worse.  You have a fever.  Your pain is felt only in portions of the abdomen. The right side could possibly be appendicitis. The left lower portion of the abdomen could be colitis or diverticulitis.  You are passing blood in your stools (bright red or black tarry stools, with or without vomiting).  You have blood in your urine.  You develop chills, with or without a fever.  You pass out. MAKE SURE YOU:   Understand these instructions.  Will watch your condition.  Will get help right away if you are not doing well or get worse. Document Released: 09/10/2007 Document Revised: 03/30/2014 Document Reviewed:  09/30/2009 [facility] Patient Information 2015 [place], [place]. This information is not intended to replace advice given to you by your health care provider. Make sure you discuss any questions you have with your health care provider.  Pelvic Inflammatory Disease Pelvic inflammatory disease (PID) is an infection in some or all of the female organs. PID can be in the uterus, ovaries, fallopian tubes, or the surrounding tissues inside the lower belly area (pelvis). HOME CARE   If given, take your antibiotic medicine as told. Finish them even if you start to feel better.  Only take medicine as told by your doctor.  Do not have sex (intercourse) until treatment is done or as told by your doctor.  Tell your sex partner if you have PID. Your partner may need to be treated.  Keep all doctor visits. GET HELP RIGHT AWAY IF:   You have a fever.  You have more belly (abdominal) or lower belly pain.  You have chills.  You have pain when you pee (urinate).  You are not better after 72 hours.  You have more fluid (discharge) coming from your vagina or fluid that is not normal.  You need pain medicine from your doctor.  You throw up (vomit).  You cannot take your medicines.  Your partner has a sexually transmitted disease (STD). MAKE SURE YOU:   Understand  these instructions.  Will watch your condition.  Will get help right away if you are not doing well or get worse. Document Released: 02/09/2009 Document Revised: 03/10/2013 Document Reviewed: 11/09/2011 [facility] Patient Information 2015 [place], [place]. This information is not intended to replace advice given to you by your health care provider. Make sure you discuss any questions you have with your health care provider.  Peripheral Edema You have swelling in your legs (peripheral edema). This swelling is due to excess accumulation of salt and water in your body. Edema may be a sign of heart, kidney or liver disease, or a side effect  of a medication. It may also be due to problems in the leg veins. Elevating your legs and using special support stockings may be very helpful, if the cause of the swelling is due to poor venous circulation. Avoid long periods of standing, whatever the cause. Treatment of edema depends on identifying the cause. Chips, pretzels, pickles and other salty foods should be avoided. Restricting salt in your diet is almost always needed. Water pills (diuretics) are often used to remove the excess salt and water from your body via urine. These medicines prevent the kidney from reabsorbing sodium. This increases urine flow. Diuretic treatment may also result in lowering of potassium levels in your body. Potassium supplements may be needed if you have to use diuretics daily. Daily weights can help you keep track of your progress in clearing your edema. You should call your caregiver for follow up care as recommended. SEEK IMMEDIATE MEDICAL CARE IF:   You have increased swelling, pain, redness, or heat in your legs.  You develop shortness of breath, especially when lying down.  You develop chest or abdominal pain, weakness, or fainting.  You have a fever. Document Released: 12/21/2004 Document Revised: 02/05/2012 Document Reviewed: 12/01/2009 [facility] Patient Information 2015 [place], [place]. This information is not intended to replace advice given to you by your health care provider. Make sure you discuss any questions you have with your health care provider.  Sexually Transmitted Disease A sexually transmitted disease (STD) is a disease or infection often passed to another person during sex. However, STDs can be passed through nonsexual ways. An STD can be passed through:  Spit (saliva).  Semen.  Blood.  Mucus from the vagina.  Pee (urine). HOW CAN I LESSEN MY CHANCES OF GETTING AN STD?  Use:  Latex condoms.  Water-soluble lubricants with condoms. Do not use petroleum jelly or oils.  Dental  dams. These are small pieces of latex that are used as a barrier during oral sex.  Avoid having more than one sex partner.  Do not have sex with someone who has other sex partners.  Do not have sex with anyone you do not know or who is at high risk for an STD.  Avoid risky sex that can break your skin.  Do not have sex if you have open sores on your mouth or skin.  Avoid drinking too much alcohol or taking illegal drugs. Alcohol and drugs can affect your good judgment.  Avoid oral and anal sex acts.  Get shots (vaccines) for HPV and hepatitis.  If you are at risk of being infected with HIV, it is advised that you take a certain medicine daily to prevent HIV infection. This is called pre-exposure prophylaxis (PrEP). You may be at risk if:  You are a man who has sex with other men (MSM).  You are attracted to the opposite sex (heterosexual) and are  having sex with more than one partner.  You take drugs with a needle.  You have sex with someone who has HIV.  Talk with your doctor about if you are at high risk of being infected with HIV. If you begin to take PrEP, get tested for HIV first. Get tested every 3 months for as long as you are taking PrEP. WHAT SHOULD I DO IF I THINK I HAVE AN STD?  See your doctor.  Tell your sex partner(s) that you have an STD. They should be tested and treated.  Do not have sex until your doctor says it is okay. WHEN SHOULD I GET HELP? Get help right away if:  You have bad belly (abdominal) pain.  You are a man and have puffiness (swelling) or pain in your testicles.  You are a woman and have puffiness in your vagina. Document Released: 12/21/2004 Document Revised: 11/18/2013 Document Reviewed: 05/09/2013 [facility] Patient Information 2015 [place], [place]. This information is not intended to replace advice given to you by your health care provider. Make sure you discuss any questions you have with your health care provider.  Urinary Tract  Infection A urinary tract infection (UTI) can occur any place along the urinary tract. The tract includes the kidneys, ureters, bladder, and urethra. A type of germ called bacteria often causes a UTI. UTIs are often helped with antibiotic medicine.  HOME CARE   If given, take antibiotics as told by your doctor. Finish them even if you start to feel better.  Drink enough fluids to keep your pee (urine) clear or pale yellow.  Avoid tea, drinks with caffeine, and bubbly (carbonated) drinks.  Pee often. Avoid holding your pee in for a long time.  Pee before and after having sex (intercourse).  Wipe from front to back after you poop (bowel movement) if you are a woman. Use each tissue only once. GET HELP RIGHT AWAY IF:   You have back pain.  You have lower belly (abdominal) pain.  You have chills.  You feel sick to your stomach (nauseous).  You throw up (vomit).  Your burning or discomfort with peeing does not go away.  You have a fever.  Your symptoms are not better in 3 days. MAKE SURE YOU:   Understand these instructions.  Will watch your condition.  Will get help right away if you are not doing well or get worse. Document Released: 05/01/2008 Document Revised: 08/07/2012 Document Reviewed: 06/13/2012 [facility] Patient Information 2015 [place], [place]. This information is not intended to replace advice given to you by your health care provider. Make sure you discuss any questions you have with your health care provider.

## 2014-07-14 LAB — GC/CHLAMYDIA PROBE AMP
CT Probe RNA: POSITIVE — AB
GC PROBE AMP APTIMA: NEGATIVE

## 2014-07-14 LAB — URINE CULTURE: Colony Count: 80000

## 2014-07-15 ENCOUNTER — Telehealth (HOSPITAL_BASED_OUTPATIENT_CLINIC_OR_DEPARTMENT_OTHER): Payer: Self-pay

## 2014-07-15 NOTE — Telephone Encounter (Signed)
[facility]  (+) Chlamydia Treated with Zithromax and Rocephin.  [facility] form completed and faxed.  Call and notify patient.  8/19@17 :58 Pt informed of dx, Tx rcvd approp, notify partner for testing and tx and abstain from sex x 2 wks post tx.  [facility]HS completed and faxed

## 2014-07-20 ENCOUNTER — Encounter: Payer: Self-pay | Admitting: Obstetrics & Gynecology

## 2014-07-20 ENCOUNTER — Ambulatory Visit (INDEPENDENT_AMBULATORY_CARE_PROVIDER_SITE_OTHER): Payer: Medicaid Other | Admitting: Obstetrics & Gynecology

## 2014-07-20 ENCOUNTER — Encounter (HOSPITAL_COMMUNITY): Payer: Self-pay | Admitting: *Deleted

## 2014-07-20 ENCOUNTER — Observation Stay (HOSPITAL_COMMUNITY): Payer: Medicaid Other

## 2014-07-20 ENCOUNTER — Inpatient Hospital Stay (HOSPITAL_COMMUNITY)
Admission: AD | Admit: 2014-07-20 | Discharge: 2014-07-22 | DRG: 759 | Disposition: A | Discharge status: Home / Self Care | Payer: Medicaid Other | Source: Ambulatory Visit | Attending: Obstetrics & Gynecology | Admitting: Obstetrics & Gynecology

## 2014-07-20 VITALS — BP 134/91 | HR 86 | Temp 98.2°F | Ht 61.0 in | Wt 254.0 lb

## 2014-07-20 DIAGNOSIS — N730 Acute parametritis and pelvic cellulitis: Principal | ICD-10-CM | POA: Diagnosis present

## 2014-07-20 DIAGNOSIS — N83209 Unspecified ovarian cyst, unspecified side: Secondary | ICD-10-CM

## 2014-07-20 DIAGNOSIS — F3289 Other specified depressive episodes: Secondary | ICD-10-CM

## 2014-07-20 DIAGNOSIS — G4730 Sleep apnea, unspecified: Secondary | ICD-10-CM | POA: Diagnosis present

## 2014-07-20 DIAGNOSIS — F329 Major depressive disorder, single episode, unspecified: Secondary | ICD-10-CM | POA: Diagnosis present

## 2014-07-20 DIAGNOSIS — N949 Unspecified condition associated with female genital organs and menstrual cycle: Secondary | ICD-10-CM

## 2014-07-20 DIAGNOSIS — R112 Nausea with vomiting, unspecified: Secondary | ICD-10-CM | POA: Diagnosis present

## 2014-07-20 LAB — URINALYSIS, ROUTINE W REFLEX MICROSCOPIC
Bilirubin Urine: NEGATIVE
Glucose, UA: NEGATIVE mg/dL
Hgb urine dipstick: NEGATIVE
KETONES UR: NEGATIVE mg/dL
LEUKOCYTES UA: NEGATIVE
Nitrite: NEGATIVE
PH: 6 (ref 5.0–8.0)
Protein, ur: NEGATIVE mg/dL
Specific Gravity, Urine: 1.025 (ref 1.005–1.030)
Urobilinogen, UA: 0.2 mg/dL (ref 0.0–1.0)

## 2014-07-20 LAB — CBC WITH DIFFERENTIAL/PLATELET
BASOS ABS: 0 10*3/uL (ref 0.0–0.1)
Basophils Relative: 0 % (ref 0–1)
EOS PCT: 3 % (ref 0–5)
Eosinophils Absolute: 0.3 10*3/uL (ref 0.0–0.7)
HCT: 37.9 % (ref 36.0–46.0)
Hemoglobin: 12.5 g/dL (ref 12.0–15.0)
LYMPHS PCT: 26 % (ref 12–46)
Lymphs Abs: 2.6 10*3/uL (ref 0.7–4.0)
MCH: 27.5 pg (ref 26.0–34.0)
MCHC: 33 g/dL (ref 30.0–36.0)
MCV: 83.3 fL (ref 78.0–100.0)
Monocytes Absolute: 0.6 10*3/uL (ref 0.1–1.0)
Monocytes Relative: 6 % (ref 3–12)
NEUTROS ABS: 6.6 10*3/uL (ref 1.7–7.7)
NEUTROS PCT: 65 % (ref 43–77)
PLATELETS: 296 10*3/uL (ref 150–400)
RBC: 4.55 MIL/uL (ref 3.87–5.11)
RDW: 14.5 % (ref 11.5–15.5)
WBC: 10.1 10*3/uL (ref 4.0–10.5)

## 2014-07-20 LAB — BASIC METABOLIC PANEL
Anion gap: 11 (ref 5–15)
BUN: 14 mg/dL (ref 6–23)
CO2: 25 meq/L (ref 19–32)
Calcium: 9 mg/dL (ref 8.4–10.5)
Chloride: 103 mEq/L (ref 96–112)
Creatinine, Ser: 0.75 mg/dL (ref 0.50–1.10)
GFR calc non Af Amer: >90 mL/min (ref >90)
Glucose, Bld: 81 mg/dL (ref 70–99)
Potassium: 4.4 mEq/L (ref 3.7–5.3)
SODIUM: 139 meq/L (ref 137–147)

## 2014-07-20 IMAGING — US US PELVIS COMPLETE
1 series · 13 of 25 positions shown · non-contrast
Comparison: None.

CLINICAL DATA: Recent diagnosis of pelvic inflammatory disease.
Evaluate for pelvic abscess.

EXAM:
TRANSABDOMINAL ULTRASOUND OF PELVIS
TECHNIQUE: Transabdominal ultrasound examination of the pelvis was performed
including evaluation of the uterus, ovaries, adnexal regions, and
pelvic cul-de-sac.

[Series 1: us pelvis complete · 56 acquisitions, 13 frames shown]
[im 1/56]
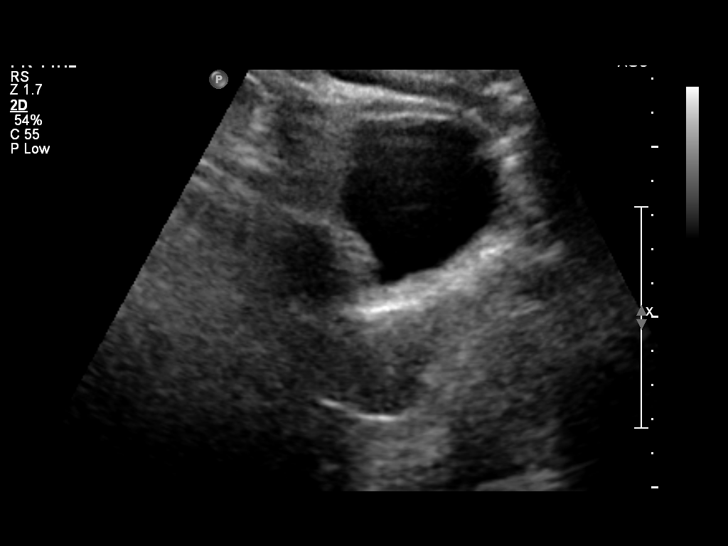
[im 5/56]
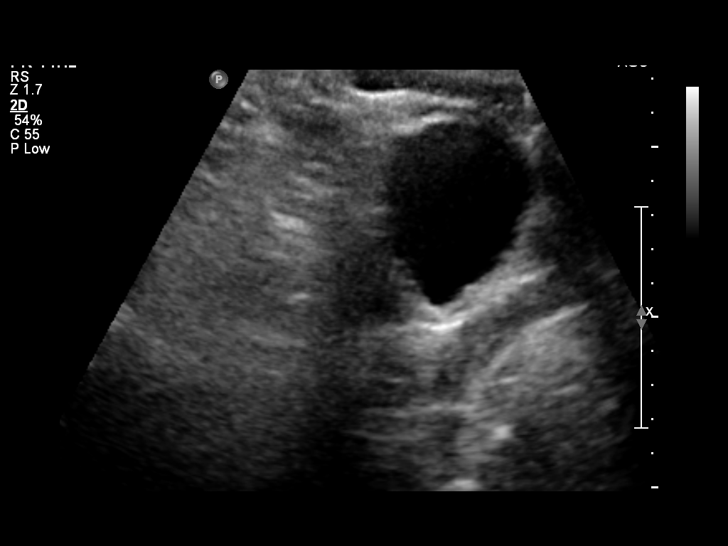
[im 10/56]
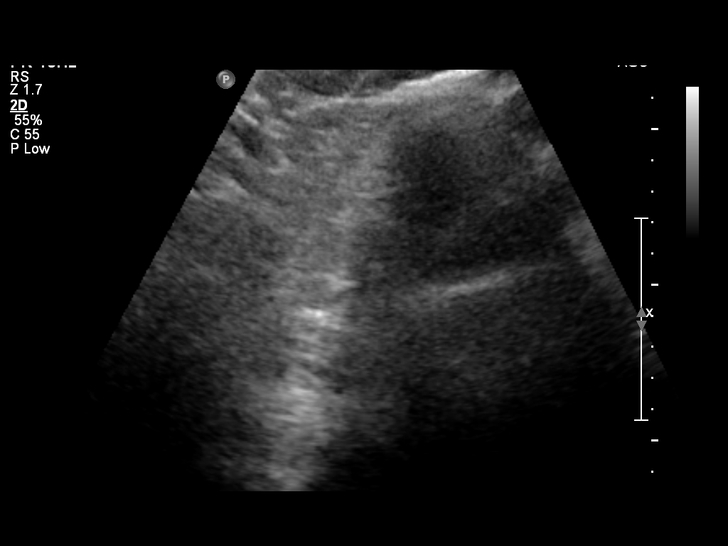
[im 14/56]
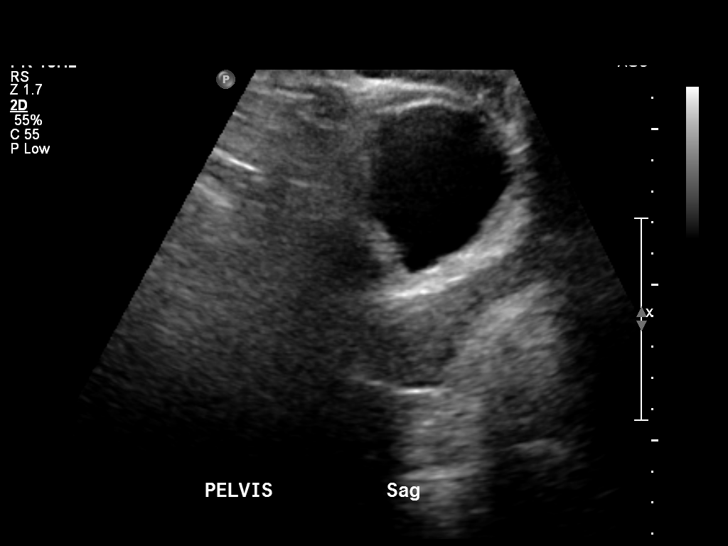
[im 19/56]
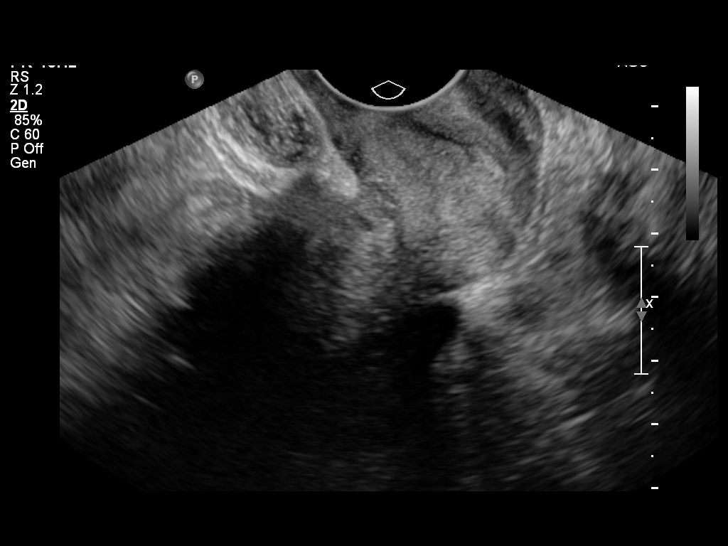
[im 23/56]
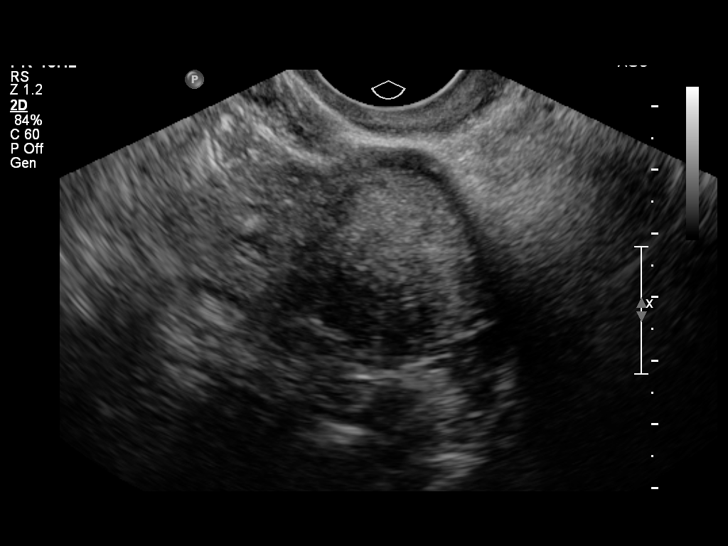
[im 28/56]
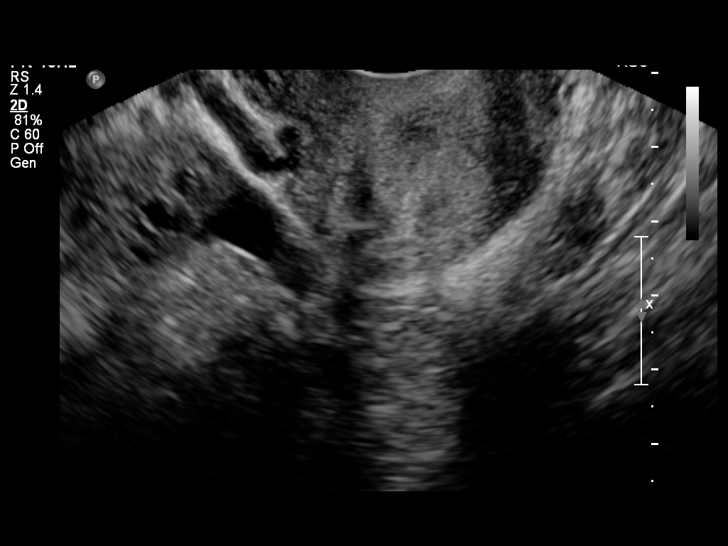
[im 33/56]
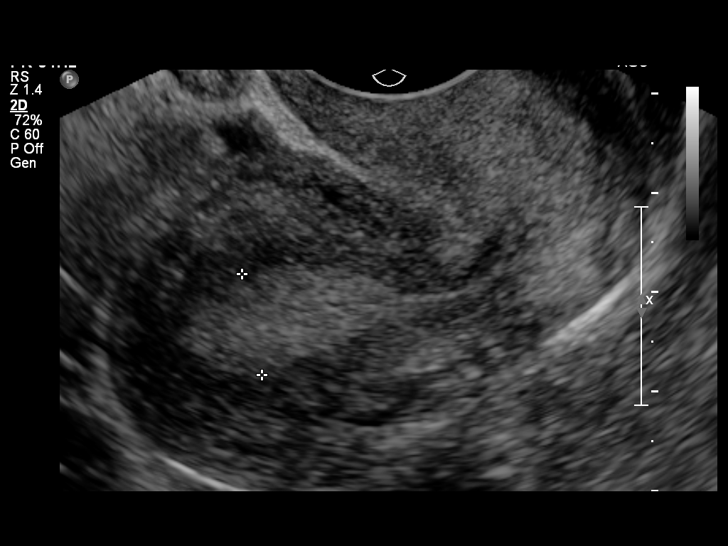
[im 37/56]
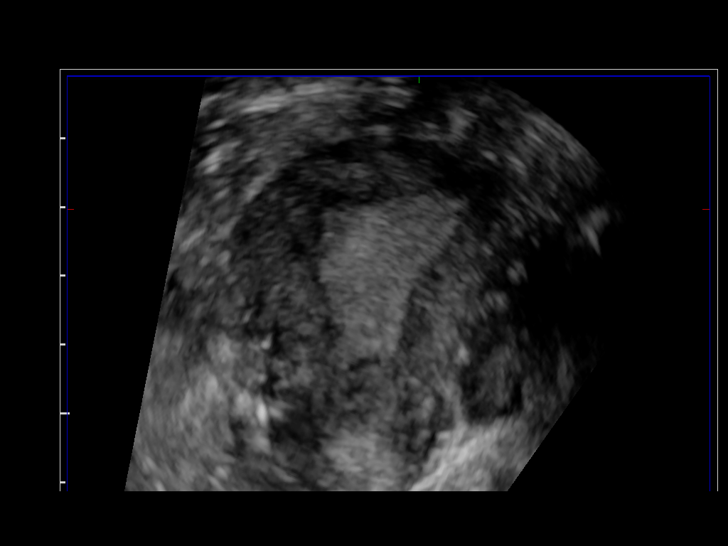
[im 42/56]
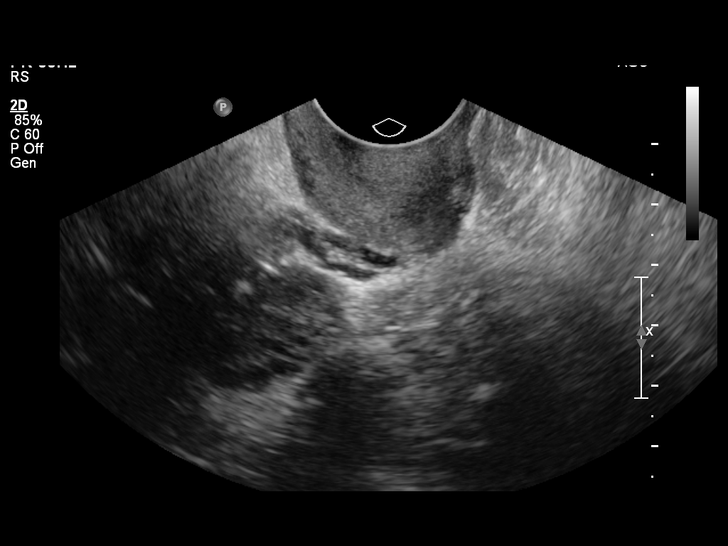
[im 46/56]
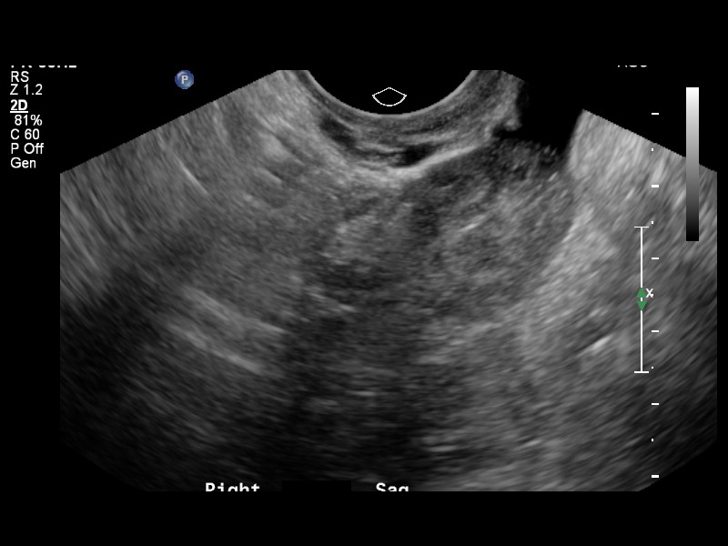
[im 51/56]
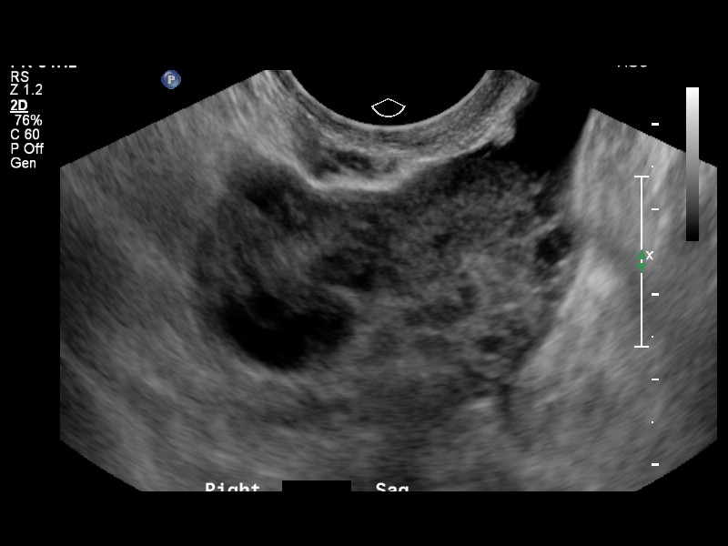
[im 56/56]
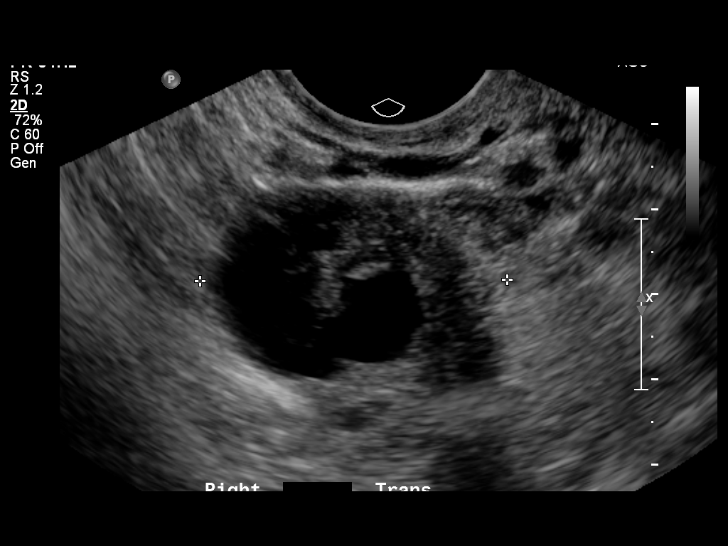

[13 of 25 positions shown; findings below may reference images not displayed]

FINDINGS: Uterus

Measurements: 7.1 x 3.6 x 3.9 cm and anteverted. Normal in
echotexture and contour. No fibroids or other mass visualized.

Endometrium

Thickness: 10.4 mm.  No focal abnormality visualized.

Right ovary

Measurements: 4.3 x 3 x 1 x 3.6 cm. Contains a 2.8 x 2.4 x 2.2 cm
mildly complex cyst. Vascular flow this identified to the right
ovary, but there is no flow within the cyst.

Left ovary

Measurements: Measures 3.4 x 1.5 x 2.4 cm. Normal appearance/no
adnexal mass.

Other findings: No trace amount of free fluid in the posterior
cul-de-sac appears simple. No complex free fluid or evidence of
pelvic abscess.
IMPRESSION: 1. No evidence of pelvic abscess.  No significant free fluid.
2. Mildly complex 2.8 cm right ovarian cyst. This statistically most
likely reflects a resolving corpus luteum or resolving hemorrhagic
cyst. Short-interval follow up ultrasound in 6-12 weeks is
recommended, preferably during the week following the patient's
normal menses.

## 2014-07-20 MED ORDER — MORPHINE SULFATE 4 MG/ML IJ SOLN
1.0000 mg | INTRAMUSCULAR | Status: DC | PRN
  Administered 2014-07-20: 2 mg via INTRAVENOUS
  Filled 2014-07-20: qty 1

## 2014-07-20 MED ORDER — ONDANSETRON HCL 4 MG/2ML IJ SOLN: 4.0000 mg | Freq: Four times a day (QID) | INTRAMUSCULAR | Status: DC | PRN

## 2014-07-20 MED ORDER — SODIUM CHLORIDE 0.9 % IV SOLN
3.0000 g | Freq: Four times a day (QID) | INTRAVENOUS | Status: DC
  Administered 2014-07-20 – 2014-07-22 (×7): 3 g via INTRAVENOUS
  Filled 2014-07-20 (×9): qty 3

## 2014-07-20 MED ORDER — DEXTROSE-NACL 5-0.45 % IV SOLN
INTRAVENOUS | Status: DC
  Administered 2014-07-20 – 2014-07-22 (×4): via INTRAVENOUS

## 2014-07-20 MED ORDER — IBUPROFEN 600 MG PO TABS
600.0000 mg | ORAL_TABLET | Freq: Four times a day (QID) | ORAL | Status: DC | PRN
  Administered 2014-07-20 – 2014-07-22 (×3): 600 mg via ORAL
  Filled 2014-07-20 (×3): qty 1

## 2014-07-20 MED ORDER — ONDANSETRON HCL 4 MG PO TABS
4.0000 mg | ORAL_TABLET | Freq: Four times a day (QID) | ORAL | Status: DC | PRN
  Administered 2014-07-21 – 2014-07-22 (×2): 4 mg via ORAL
  Filled 2014-07-20 (×2): qty 1

## 2014-07-20 MED ORDER — DOXYCYCLINE HYCLATE 100 MG IV SOLR
100.0000 mg | Freq: Two times a day (BID) | INTRAVENOUS | Status: DC
  Administered 2014-07-20 – 2014-07-22 (×4): 100 mg via INTRAVENOUS
  Filled 2014-07-20 (×5): qty 100

## 2014-07-20 MED ORDER — PRENATAL MULTIVITAMIN CH
1.0000 | ORAL_TABLET | Freq: Every day | ORAL | Status: DC
  Administered 2014-07-21: 1 via ORAL
  Filled 2014-07-20: qty 1

## 2014-07-20 MED ORDER — OXYCODONE-ACETAMINOPHEN 5-325 MG PO TABS
1.0000 | ORAL_TABLET | ORAL | Status: DC | PRN
  Administered 2014-07-20: 2 via ORAL
  Administered 2014-07-21 (×2): 1 via ORAL
  Administered 2014-07-22: 2 via ORAL
  Filled 2014-07-20: qty 1
  Filled 2014-07-20: qty 2
  Filled 2014-07-20: qty 1
  Filled 2014-07-20: qty 2

## 2014-07-20 NOTE — H&P (Signed)
  HPI Pt is a 23 yo G0 with c/o severe pelvic pain.  She reports being seen at the [facility] ED 6 days prev and dx'd with PID. She presented to the clinic today as a walking as she did not feel that she could make it to her visit in Sept.  She reports that she was given medication for pain and antibiotics however, she has been unable to take the meds due to excessive N/V.  She denies f/c.    Past Medical History  Diagnosis Date  . Mental disorder   . Depression   . Sleep apnea    Past Surgical History  Procedure Laterality Date  . Knee surgery     No current facility-administered medications on file prior to visit.   Current Outpatient Prescriptions on File Prior to Visit  Medication Sig Dispense Refill  . doxycycline (VIBRAMYCIN) 100 MG capsule Take 1 capsule (100 mg total) by mouth 2 (two) times daily. One po bid x 14 days  28 capsule  0  . HYDROcodone-acetaminophen (NORCO) 5-325 MG per tablet Take 1-2 tablets by mouth every 6 (six) hours as needed for severe pain.  6 tablet  0  . sulfamethoxazole-trimethoprim (BACTRIM DS) 800-160 MG per tablet Take 1 tablet by mouth 2 (two) times daily.  14 tablet  0  . DULoxetine (CYMBALTA) 60 MG capsule Take 60 mg by mouth daily.      . naproxen (NAPROSYN) 500 MG tablet Take 1 tablet (500 mg total) by mouth 2 (two) times daily as needed for mild pain, moderate pain or headache (TAKE WITH MEALS.).  20 tablet  0  . traMADol (ULTRAM) 50 MG tablet Take 50 mg by mouth every 6 (six) hours as needed.      . traZODone (DESYREL) 100 MG tablet Take 100 mg by mouth at bedtime as needed for sleep.        History   Social History  . Marital Status: Single    Spouse Name: N/A    Number of Children: N/A  . Years of Education: N/A   Occupational History  . Not on file.   Social History Main Topics  . Smoking status: Never Smoker   . Smokeless tobacco: Not on file  . Alcohol Use: No  . Drug Use: No  . Sexual Activity: No   Other Topics Concern  . Not on file    Social History Narrative  . No narrative on file   No family history on file.     Review of Systems     Objective:   Physical Exam BP 134/91  Pulse 86  Temp(Src) 98.2 F (36.8 C) (Oral)  Ht  (1.549 m)  Wt 254 lb (115.214 kg)  BMI 48.02 kg/m2  LMP 06/18/2014 Pt looks acutely uncomfortable. Abd: obese; diffusely tender. No rebound or guarding  Full exam deferred due to excessive pain  + chlamydia  on 07/14/2014       Assessment:     PID in nulliparous pt with inability to tolerate outpatient treatment.  Will admit for IV atbx and pain control    Plan:     Admit to [facility] unit IV Unasyn and doxycycline IVF Morphine and Percocet as needed for pain Pelvic sono CBC and BMP Dr. Jolayne Panther notified

## 2014-07-20 NOTE — Patient Instructions (Signed)
Pelvic Inflammatory Disease °Pelvic inflammatory disease (PID) refers to an infection in some or all of the female organs. The infection can be in the uterus, ovaries, fallopian tubes, or the surrounding tissues in the pelvis. PID can cause abdominal or pelvic pain that comes on suddenly (acute pelvic pain). PID is a serious infection because it can lead to lasting (chronic) pelvic pain or the inability to have children (infertile).  °CAUSES  °The infection is often caused by the normal bacteria found in the vaginal tissues. PID may also be caused by an infection that is spread during sexual contact. PID can also occur following:  °· The birth of a baby.   °· A miscarriage.   °· An abortion.   °· Major pelvic surgery.   °· The use of an intrauterine device (IUD).   °· A sexual assault.   °RISK FACTORS °Certain factors can put a person at higher risk for PID, such as: °· Being younger than 25 years. °· Being sexually active at a young age. °· Using nonbarrier contraception. °· Having multiple sexual partners. °· Having sex with someone who has symptoms of a genital infection. °· Using oral contraception. °Other times, certain behaviors can increase the possibility of getting PID, such as: °· Having sex during your period. °· Using a vaginal douche. °· Having an intrauterine device (IUD) in place. °SYMPTOMS  °· Abdominal or pelvic pain.   °· Fever.   °· Chills.   °· Abnormal vaginal discharge. °· Abnormal uterine bleeding.   °· Unusual pain shortly after finishing your period. °DIAGNOSIS  °Your caregiver will choose some of the following methods to make a diagnosis, such as:  °· Performing a physical exam and history. A pelvic exam typically reveals a very tender uterus and surrounding pelvis.   °· Ordering laboratory tests including a pregnancy test, blood tests, and urine test.  °· Ordering cultures of the vagina and cervix to check for a sexually transmitted infection (STI). °· Performing an ultrasound.    °· Performing a laparoscopic procedure to look inside the pelvis.   °TREATMENT  °· Antibiotic medicines may be prescribed and taken by mouth.   °· Sexual partners may be treated when the infection is caused by a sexually transmitted disease (STD).   °· Hospitalization may be needed to give antibiotics intravenously. °· Surgery may be needed, but this is rare. °It may take weeks until you are completely well. If you are diagnosed with PID, you should also be checked for human immunodeficiency virus (HIV).   °HOME CARE INSTRUCTIONS  °· If given, take your antibiotics as directed. Finish the medicine even if you start to feel better.   °· Only take over-the-counter or prescription medicines for pain, discomfort, or fever as directed by your caregiver.   °· Do not have sexual intercourse until treatment is completed or as directed by your caregiver. If PID is confirmed, your recent sexual partner(s) will need treatment.   °· Keep your follow-up appointments. °SEEK MEDICAL CARE IF:  °· You have increased or abnormal vaginal discharge.   °· You need prescription medicine for your pain.   °· You vomit.   °· You cannot take your medicines.   °· Your partner has an STD.   °SEEK IMMEDIATE MEDICAL CARE IF:  °· You have a fever.   °· You have increased abdominal or pelvic pain.   °· You have chills.   °· You have pain when you urinate.   °· You are not better after 72 hours following treatment.   °MAKE SURE YOU:  °· Understand these instructions. °· Will watch your condition. °· Will get help right away if you are not doing well or get worse. °  Document Released: 11/13/2005 Document Revised: 03/10/2013 Document Reviewed: 11/09/2011 °[employer]® Patient Information ©2015 [employer]. This information is not intended to replace advice given to you by your health care provider. Make sure you discuss any questions you have with your health care provider. ° °

## 2014-07-20 NOTE — Progress Notes (Signed)
Patient reports she is being treated for PID and is trying to take abx as directed but keeps vomiting the antibiotics up. Her pain is over a 10 today.

## 2014-07-20 NOTE — Progress Notes (Signed)
Subjective:     Patient ID: Alexandra Gray, female   DOB: May 16, 1991, 23 y.o.   MRN: [id number]  HPI Pt is a 23 yo G0 with c/o severe pelvic pain.  She reports being seen at the [facility] ED 6 days prev and dx'd with PID. She presented to the clinic today as a walking as she did not feel that she could make it to her visit in Sept.  She reports that she was given medication for pain and antibiotics however, she has been unable to take the meds due to excessive N/V.  She denies f/c.    Past Medical History  Diagnosis Date  . Mental disorder   . Depression   . Sleep apnea    Past Surgical History  Procedure Laterality Date  . Knee surgery     No current facility-administered medications on file prior to visit.   Current Outpatient Prescriptions on File Prior to Visit  Medication Sig Dispense Refill  . doxycycline (VIBRAMYCIN) 100 MG capsule Take 1 capsule (100 mg total) by mouth 2 (two) times daily. One po bid x 14 days  28 capsule  0  . HYDROcodone-acetaminophen (NORCO) 5-325 MG per tablet Take 1-2 tablets by mouth every 6 (six) hours as needed for severe pain.  6 tablet  0  . sulfamethoxazole-trimethoprim (BACTRIM DS) 800-160 MG per tablet Take 1 tablet by mouth 2 (two) times daily.  14 tablet  0  . DULoxetine (CYMBALTA) 60 MG capsule Take 60 mg by mouth daily.      . naproxen (NAPROSYN) 500 MG tablet Take 1 tablet (500 mg total) by mouth 2 (two) times daily as needed for mild pain, moderate pain or headache (TAKE WITH MEALS.).  20 tablet  0  . traMADol (ULTRAM) 50 MG tablet Take 50 mg by mouth every 6 (six) hours as needed.      . traZODone (DESYREL) 100 MG tablet Take 100 mg by mouth at bedtime as needed for sleep.        History   Social History  . Marital Status: Single    Spouse Name: N/A    Number of Children: N/A  . Years of Education: N/A   Occupational History  . Not on file.   Social History Main Topics  . Smoking status: Never Smoker   . Smokeless tobacco: Not on file   . Alcohol Use: No  . Drug Use: No  . Sexual Activity: No   Other Topics Concern  . Not on file   Social History Narrative  . No narrative on file   No family history on file.     Review of Systems     Objective:   Physical Exam BP 134/91  Pulse 86  Temp(Src) 98.2 F (36.8 C) (Oral)  Ht  (1.549 m)  Wt 254 lb (115.214 kg)  BMI 48.02 kg/m2  LMP 06/18/2014 Pt looks acutely uncomfortable. Abd: obese; diffusely tender. No rebound or guarding  Full exam deferred due to excessive pain  + chlamydia  on 07/14/2014       Assessment:     PID in nulliparous pt with inability to tolerate outpatient treatment.  Will admit for IV atbx and pain control    Plan:     Admit to [facility] unit IV Unasyn and doxycycline IVF Morphine and Percocet as needed for pain Pelvic sono CBC and BMP Dr. Jolayne Panther notified

## 2014-07-21 DIAGNOSIS — N949 Unspecified condition associated with female genital organs and menstrual cycle: Secondary | ICD-10-CM | POA: Diagnosis present

## 2014-07-21 DIAGNOSIS — F329 Major depressive disorder, single episode, unspecified: Secondary | ICD-10-CM | POA: Diagnosis present

## 2014-07-21 DIAGNOSIS — F3289 Other specified depressive episodes: Secondary | ICD-10-CM | POA: Diagnosis present

## 2014-07-21 DIAGNOSIS — G4730 Sleep apnea, unspecified: Secondary | ICD-10-CM | POA: Diagnosis present

## 2014-07-21 DIAGNOSIS — R112 Nausea with vomiting, unspecified: Secondary | ICD-10-CM | POA: Diagnosis present

## 2014-07-21 DIAGNOSIS — N730 Acute parametritis and pelvic cellulitis: Secondary | ICD-10-CM | POA: Diagnosis present

## 2014-07-21 DIAGNOSIS — N83209 Unspecified ovarian cyst, unspecified side: Secondary | ICD-10-CM | POA: Diagnosis present

## 2014-07-21 MED ORDER — DULOXETINE HCL 60 MG PO CPEP
60.0000 mg | ORAL_CAPSULE | Freq: Every day | ORAL | Status: DC
  Administered 2014-07-21 – 2014-07-22 (×2): 60 mg via ORAL
  Filled 2014-07-21 (×2): qty 1

## 2014-07-21 NOTE — Progress Notes (Signed)
Subjective: Patient reports persistent pain but improved since admission. She has been able to tolerate liquids. No emesis overnight    Objective: I have reviewed patient's vital signs, labs and radiology results.  General: alert, cooperative, no distress and moderately obese Resp: clear to auscultation bilaterally Cardio: regular rate and rhythm GI: soft, mild diffuse tenderness, no rebound, no guarding Extremities: extremities normal, atraumatic, no cyanosis or edema and Homans sign is negative, no sign of DVT  8/24 US FINDINGS:  Uterus  Measurements: 7.1 x 3.6 x 3.9 cm and anteverted. Normal in  echotexture and contour. No fibroids or other mass visualized.  Endometrium  Thickness: 10.4 mm. No focal abnormality visualized.  Right ovary  Measurements: 4.3 x 3 x 1 x 3.6 cm. Contains a 2.8 x 2.4 x 2.2 cm  mildly complex cyst. Vascular flow this identified to the right  ovary, but there is no flow within the cyst.  Left ovary  Measurements: Measures 3.4 x 1.5 x 2.4 cm. Normal appearance/no  adnexal mass.  Other findings: No trace amount of free fluid in the posterior  cul-de-sac appears simple. No complex free fluid or evidence of  pelvic abscess.  IMPRESSION:  1. No evidence of pelvic abscess. No significant free fluid.  2. Mildly complex 2.8 cm right ovarian cyst. This statistically most  likely reflects a resolving corpus luteum or resolving hemorrhagic  cyst. Short-interval follow up ultrasound in 6-12 weeks is  recommended, preferably during the week following the patient's  normal menses.  Assessment/Plan: 23 yo admitted for inpatient management of PID secondary to nausea/emesis - Patient doing well on IV antibiotics - Consider discharge home tomorrow if continue to improve and remain afebrile - Advance diet as tolerated   LOS: 1 day    Alexandra Gray 07/21/2014, 7:03 AM

## 2014-07-21 NOTE — Progress Notes (Signed)
UR completed 

## 2014-07-22 DIAGNOSIS — N730 Acute parametritis and pelvic cellulitis: Principal | ICD-10-CM

## 2014-07-22 MED ORDER — OXYCODONE-ACETAMINOPHEN 5-325 MG PO TABS: 1.0000 | ORAL_TABLET | ORAL | Status: DC | PRN

## 2014-07-22 MED ORDER — DOXYCYCLINE HYCLATE 100 MG PO CAPS: 100.0000 mg | ORAL_CAPSULE | Freq: Two times a day (BID) | ORAL | Status: DC

## 2014-07-22 MED ORDER — PROMETHAZINE HCL 25 MG PO TABS: 25.0000 mg | ORAL_TABLET | Freq: Four times a day (QID) | ORAL | Status: DC | PRN

## 2014-07-22 MED ORDER — ONDANSETRON HCL 4 MG PO TABS: 4.0000 mg | ORAL_TABLET | Freq: Four times a day (QID) | ORAL | Status: DC | PRN

## 2014-07-22 MED ORDER — DOCUSATE SODIUM 100 MG PO CAPS: 100.0000 mg | ORAL_CAPSULE | Freq: Two times a day (BID) | ORAL | Status: DC | PRN

## 2014-07-22 NOTE — Discharge Summary (Signed)
Physician Discharge Summary  Patient ID: Alexandra Gray MRN: [id number] DOB/AGE: [birth year] 23 y.o.  Admit date: 07/20/2014 Discharge date: 07/22/2014  Active Problems:   PID (acute pelvic inflammatory disease)  Discharged Condition: Stable  Hospital Course: Patient admitted for inpatient treatment of PID as she was unable to tolerate medications due nausea. Was given IV antibiotics (Unasyn and Doxycycline) transitioned to oral Doxycycline at discharge; which she tolerated with antiemetics.  Pain treated with analgesics. Discharged to home, will follow up in clinic.  Significant Diagnostic Studies:  07/20/2014   CLINICAL DATA:  Recent diagnosis of pelvic inflammatory disease. Evaluate for pelvic abscess.  EXAM: TRANSABDOMINAL ULTRASOUND OF PELVIS  TECHNIQUE: Transabdominal ultrasound examination of the pelvis was performed including evaluation of the uterus, ovaries, adnexal regions, and pelvic cul-de-sac.  COMPARISON:  None.  FINDINGS: Uterus  Measurements: 7.1 x 3.6 x 3.9 cm and anteverted. Normal in echotexture and contour. No fibroids or other mass visualized.  Endometrium  Thickness: 10.4 mm.  No focal abnormality visualized.  Right ovary  Measurements: 4.3 x 3 x 1 x 3.6 cm. Contains a 2.8 x 2.4 x 2.2 cm mildly complex cyst. Vascular flow this identified to the right ovary, but there is no flow within the cyst.  Left ovary  Measurements: Measures 3.4 x 1.5 x 2.4 cm. Normal appearance/no adnexal mass.  Other findings: No trace amount of free fluid in the posterior cul-de-sac appears simple. No complex free fluid or evidence of pelvic abscess.  IMPRESSION: 1. No evidence of pelvic abscess.  No significant free fluid. 2. Mildly complex 2.8 cm right ovarian cyst. This statistically most likely reflects a resolving corpus luteum or resolving hemorrhagic cyst. Short-interval follow up ultrasound in 6-12 weeks is recommended, preferably during the week following the patient's normal menses.    Electronically Signed   By: Britta Mccreedy M.D.   On: 07/20/2014 18:25   Results for orders placed during the hospital encounter of 07/20/14 (from the past 168 hour(s))  BASIC METABOLIC PANEL   Collection Time    07/20/14  4:13 PM      Result Value Ref Range   Sodium 139  137 - 147 mEq/L   Potassium 4.4  3.7 - 5.3 mEq/L   Chloride 103  96 - 112 mEq/L   CO2 25  19 - 32 mEq/L   Glucose, Bld 81  70 - 99 mg/dL   BUN 14  6 - 23 mg/dL   Creatinine, Ser 9.56  0.50 - 1.10 mg/dL   Calcium 9.0  8.4 - 21.3 mg/dL   GFR calc non Af Amer >90  >90 mL/min   GFR calc Af Amer >90  >90 mL/min   Anion gap 11  5 - 15  CBC WITH DIFFERENTIAL   Collection Time    07/20/14  4:13 PM      Result Value Ref Range   WBC 10.1  4.0 - 10.5 K/uL   RBC 4.55  3.87 - 5.11 MIL/uL   Hemoglobin 12.5  12.0 - 15.0 g/dL   HCT 08.6  57.8 - 46.9 %   MCV 83.3  78.0 - 100.0 fL   MCH 27.5  26.0 - 34.0 pg   MCHC 33.0  30.0 - 36.0 g/dL   RDW 62.9  52.8 - 41.3 %   Platelets 296  150 - 400 K/uL   Neutrophils Relative % 65  43 - 77 %   Neutro Abs 6.6  1.7 - 7.7 K/uL   Lymphocytes Relative 26  12 -  46 %   Lymphs Abs 2.6  0.7 - 4.0 K/uL   Monocytes Relative 6  3 - 12 %   Monocytes Absolute 0.6  0.1 - 1.0 K/uL   Eosinophils Relative 3  0 - 5 %   Eosinophils Absolute 0.3  0.0 - 0.7 K/uL   Basophils Relative 0  0 - 1 %   Basophils Absolute 0.0  0.0 - 0.1 K/uL  URINALYSIS, ROUTINE W REFLEX MICROSCOPIC   Collection Time    07/20/14  8:55 PM      Result Value Ref Range   Color, Urine YELLOW  YELLOW   APPearance CLEAR  CLEAR   Specific Gravity, Urine 1.025  1.005 - 1.030   pH 6.0  5.0 - 8.0   Glucose, UA NEGATIVE  NEGATIVE mg/dL   Hgb urine dipstick NEGATIVE  NEGATIVE   Bilirubin Urine NEGATIVE  NEGATIVE   Ketones, ur NEGATIVE  NEGATIVE mg/dL   Protein, ur NEGATIVE  NEGATIVE mg/dL   Urobilinogen, UA 0.2  0.0 - 1.0 mg/dL   Nitrite NEGATIVE  NEGATIVE   Leukocytes, UA NEGATIVE  NEGATIVE   Discharge Exam: Blood pressure  112/72, pulse 81, temperature 98.4 F (36.9 C), temperature source Oral, resp. rate 18, height  (1.549 m), weight 258 lb (117.028 kg), last menstrual period 06/18/2014, SpO2 100.00%. General appearance: alert and no distress GI: soft, obese, mild lower abdominal tenderness; bowel sounds normal; no masses,  no organomegaly Pelvic: deferred Extremities: extremities normal, atraumatic, no cyanosis or edema and Homans sign is negative, no sign of DVT  Disposition: 01-Home or Self Care     Medication List    STOP taking these medications       HYDROcodone-acetaminophen 5-325 MG per tablet  Commonly known as:  NORCO      TAKE these medications       docusate sodium 100 MG capsule  Commonly known as:  COLACE  Take 1 capsule (100 mg total) by mouth 2 (two) times daily as needed.     doxycycline 100 MG capsule  Commonly known as:  VIBRAMYCIN  Take 1 capsule (100 mg total) by mouth 2 (two) times daily. One po bid x 14 days     DULoxetine 60 MG capsule  Commonly known as:  CYMBALTA  Take 60 mg by mouth daily.     naproxen 500 MG tablet  Commonly known as:  NAPROSYN  Take 1 tablet (500 mg total) by mouth 2 (two) times daily as needed for mild pain, moderate pain or headache (TAKE WITH MEALS.).     ondansetron 4 MG tablet  Commonly known as:  ZOFRAN  Take 1 tablet (4 mg total) by mouth every 6 (six) hours as needed for nausea.     oxyCODONE-acetaminophen 5-325 MG per tablet  Commonly known as:  PERCOCET/ROXICET  Take 1-2 tablets by mouth every 3 (three) hours as needed for severe pain (moderate to severe pain (when tolerating fluids)).     promethazine 25 MG tablet  Commonly known as:  PHENERGAN  Take 1 tablet (25 mg total) by mouth every 6 (six) hours as needed for nausea or vomiting.     sulfamethoxazole-trimethoprim 800-160 MG per tablet  Commonly known as:  BACTRIM DS  Take 1 tablet by mouth 2 (two) times daily.     traZODone 100 MG tablet  Commonly known as:   DESYREL  Take 100 mg by mouth at bedtime as needed for sleep.       Follow-up Information  Follow up with CONSTANT,PEGGY, MD On 08/06/2014. (2:15 pm for clinic appointment. Call clinic/come to MAU for any concerning issues)    Specialty:  Obstetrics and Gynecology   Contact information:   [address] [place] [address] [phone]       Signed: Tereso Newcomer, MD 07/22/2014, 9:58 AM

## 2014-07-22 NOTE — Progress Notes (Signed)
Discharge instructions provided to patient at bedside.  Activity, medications, follow up appointments, when to call the doctor and community resources discussed.  No questions at this time.  Patient left unit in stable condition with all personal belongings and prescriptions accompanied by staff.  K. Irasema Chalk, RN------------------ 

## 2014-07-22 NOTE — Discharge Instructions (Signed)
Pelvic Inflammatory Disease °Pelvic inflammatory disease (PID) refers to an infection in some or all of the female organs. The infection can be in the uterus, ovaries, fallopian tubes, or the surrounding tissues in the pelvis. PID can cause abdominal or pelvic pain that comes on suddenly (acute pelvic pain). PID is a serious infection because it can lead to lasting (chronic) pelvic pain or the inability to have children (infertile).  °CAUSES  °The infection is often caused by the normal bacteria found in the vaginal tissues. PID may also be caused by an infection that is spread during sexual contact. PID can also occur following:  °· The birth of a baby.   °· A miscarriage.   °· An abortion.   °· Major pelvic surgery.   °· The use of an intrauterine device (IUD).   °· A sexual assault.   °RISK FACTORS °Certain factors can put a person at higher risk for PID, such as: °· Being younger than 25 years. °· Being sexually active at a young age. °· Using nonbarrier contraception. °· Having multiple sexual partners. °· Having sex with someone who has symptoms of a genital infection. °· Using oral contraception. °Other times, certain behaviors can increase the possibility of getting PID, such as: °· Having sex during your period. °· Using a vaginal douche. °· Having an intrauterine device (IUD) in place. °SYMPTOMS  °· Abdominal or pelvic pain.   °· Fever.   °· Chills.   °· Abnormal vaginal discharge. °· Abnormal uterine bleeding.   °· Unusual pain shortly after finishing your period. °DIAGNOSIS  °Your caregiver will choose some of the following methods to make a diagnosis, such as:  °· Performing a physical exam and history. A pelvic exam typically reveals a very tender uterus and surrounding pelvis.   °· Ordering laboratory tests including a pregnancy test, blood tests, and urine test.  °· Ordering cultures of the vagina and cervix to check for a sexually transmitted infection (STI). °· Performing an ultrasound.    °· Performing a laparoscopic procedure to look inside the pelvis.   °TREATMENT  °· Antibiotic medicines may be prescribed and taken by mouth.   °· Sexual partners may be treated when the infection is caused by a sexually transmitted disease (STD).   °· Hospitalization may be needed to give antibiotics intravenously. °· Surgery may be needed, but this is rare. °It may take weeks until you are completely well. If you are diagnosed with PID, you should also be checked for human immunodeficiency virus (HIV).   °HOME CARE INSTRUCTIONS  °· If given, take your antibiotics as directed. Finish the medicine even if you start to feel better.   °· Only take over-the-counter or prescription medicines for pain, discomfort, or fever as directed by your caregiver.   °· Do not have sexual intercourse until treatment is completed or as directed by your caregiver. If PID is confirmed, your recent sexual partner(s) will need treatment.   °· Keep your follow-up appointments. °SEEK MEDICAL CARE IF:  °· You have increased or abnormal vaginal discharge.   °· You need prescription medicine for your pain.   °· You vomit.   °· You cannot take your medicines.   °· Your partner has an STD.   °SEEK IMMEDIATE MEDICAL CARE IF:  °· You have a fever.   °· You have increased abdominal or pelvic pain.   °· You have chills.   °· You have pain when you urinate.   °· You are not better after 72 hours following treatment.   °MAKE SURE YOU:  °· Understand these instructions. °· Will watch your condition. °· Will get help right away if you are not doing well or get worse. °  Document Released: 11/13/2005 Document Revised: 03/10/2013 Document Reviewed: 11/09/2011 °[employer]® Patient Information ©2015 [employer]. This information is not intended to replace advice given to you by your health care provider. Make sure you discuss any questions you have with your health care provider. ° °

## 2014-08-04 ENCOUNTER — Encounter: Payer: Self-pay | Admitting: Obstetrics and Gynecology

## 2014-08-05 ENCOUNTER — Encounter: Payer: Self-pay | Admitting: Obstetrics and Gynecology

## 2014-08-05 ENCOUNTER — Inpatient Hospital Stay (HOSPITAL_COMMUNITY)
Admission: AD | Admit: 2014-08-05 | Discharge: 2014-08-05 | Disposition: A | Discharge status: Home / Self Care | Payer: Medicaid Other | Source: Ambulatory Visit | Attending: Obstetrics & Gynecology | Admitting: Obstetrics & Gynecology

## 2014-08-05 ENCOUNTER — Encounter (HOSPITAL_COMMUNITY): Payer: Self-pay | Admitting: *Deleted

## 2014-08-05 DIAGNOSIS — N949 Unspecified condition associated with female genital organs and menstrual cycle: Secondary | ICD-10-CM

## 2014-08-05 DIAGNOSIS — R102 Pelvic and perineal pain: Secondary | ICD-10-CM

## 2014-08-05 HISTORY — DX: Chlamydial infection, unspecified: A74.9

## 2014-08-05 HISTORY — DX: Gonococcal infection, unspecified: A54.9

## 2014-08-05 HISTORY — DX: Female pelvic inflammatory disease, unspecified: N73.9

## 2014-08-05 LAB — URINALYSIS, ROUTINE W REFLEX MICROSCOPIC
BILIRUBIN URINE: NEGATIVE
Glucose, UA: NEGATIVE mg/dL
Hgb urine dipstick: NEGATIVE
Ketones, ur: NEGATIVE mg/dL
Leukocytes, UA: NEGATIVE
Nitrite: NEGATIVE
Protein, ur: NEGATIVE mg/dL
SPECIFIC GRAVITY, URINE: 1.025 (ref 1.005–1.030)
UROBILINOGEN UA: 0.2 mg/dL (ref 0.0–1.0)
pH: 5.5 (ref 5.0–8.0)

## 2014-08-05 LAB — WET PREP, GENITAL
CLUE CELLS WET PREP: NONE SEEN
Trich, Wet Prep: NONE SEEN
Yeast Wet Prep HPF POC: NONE SEEN

## 2014-08-05 LAB — HIV ANTIBODY (ROUTINE TESTING W REFLEX): HIV: NONREACTIVE

## 2014-08-05 LAB — POCT PREGNANCY, URINE: Preg Test, Ur: NEGATIVE

## 2014-08-05 MED ORDER — KETOROLAC TROMETHAMINE 60 MG/2ML IM SOLN
60.0000 mg | Freq: Once | INTRAMUSCULAR | Status: AC
  Administered 2014-08-05: 60 mg via INTRAMUSCULAR
  Filled 2014-08-05: qty 2

## 2014-08-05 MED ORDER — KETOROLAC TROMETHAMINE 10 MG PO TABS: 10.0000 mg | ORAL_TABLET | Freq: Four times a day (QID) | ORAL | Status: DC | PRN

## 2014-08-05 NOTE — MAU Provider Note (Signed)
Attestation of Attending Supervision of Advanced Practitioner (PA/CNM/NP): Evaluation and management procedures were performed by the Advanced Practitioner under my supervision and collaboration.  I have reviewed the Advanced Practitioner's note and chart, and I agree with the management and plan.  Janille Draughon, MD, FACOG Attending Obstetrician & Gynecologist Faculty Practice, [facility]   

## 2014-08-05 NOTE — MAU Provider Note (Signed)
History     CSN: [id number]  Arrival date and time: 08/05/14 1220   First Provider Initiated Contact with Patient 08/05/14 1340      Chief Complaint  Patient presents with  . Pelvic Pain   HPI Alexandra Gray 23 y.o. G0P0 presents to MAU for significant pain.  She was discharged from the hospital on 8/26 after treatment for PID.  That night she began vaginal bleeding associated with cramping that was presumed to be her menstrual cycle - only heavier than usual.  This lasted 3 days with intermittent bleeding and cramping.   She reports her pain has gotten worse since discharge from the hospital.  She has regularly taken percocet every 6 hours.  The naprosyn she has taken first thing in the morning and then in the early evening.  Some mornings she wakes up without significant pain.  She feels like both medications are helpful but for a short period of time.  Her pain is suprapubic, rated at 10/10 and is continuous although it sometimes does feel better.  She sometimes is able to move around.  This morning she could not move at all and last night she did not sleep well because of pain.  She has also continued to take her antibiotics regularly.  She last took oxycodone last night before bed.  She has not had any pain medication today.  She has also experienced lightheadedness over the last 2 weeks.    OB History   Grav Para Term Preterm Abortions TAB SAB Ect Mult Living   0               Past Medical History  Diagnosis Date  . Mental disorder   . Depression   . Sleep apnea   . PID (pelvic inflammatory disease)   . Gonorrhea   . Chlamydia     Past Surgical History  Procedure Laterality Date  . Knee surgery      History reviewed. No pertinent family history.  History  Substance Use Topics  . Smoking status: Never Smoker   . Smokeless tobacco: Not on file  . Alcohol Use: No    Allergies: No Known Allergies  Prescriptions prior to admission  Medication Sig Dispense Refill   . docusate sodium (COLACE) 100 MG capsule Take 1 capsule (100 mg total) by mouth 2 (two) times daily as needed.  30 capsule  2  . DULoxetine (CYMBALTA) 60 MG capsule Take 60 mg by mouth daily.      . naproxen (NAPROSYN) 500 MG tablet Take 1 tablet (500 mg total) by mouth 2 (two) times daily as needed for mild pain, moderate pain or headache (TAKE WITH MEALS.).  20 tablet  0  . ondansetron (ZOFRAN) 4 MG tablet Take 1 tablet (4 mg total) by mouth every 6 (six) hours as needed for nausea.  20 tablet  2  . oxyCODONE-acetaminophen (PERCOCET/ROXICET) 5-325 MG per tablet Take 1-2 tablets by mouth every 3 (three) hours as needed for severe pain (moderate to severe pain (when tolerating fluids)).  30 tablet  0  . promethazine (PHENERGAN) 25 MG tablet Take 1 tablet (25 mg total) by mouth every 6 (six) hours as needed for nausea or vomiting.  30 tablet  2  . traZODone (DESYREL) 100 MG tablet Take 100 mg by mouth at bedtime as needed for sleep.         Review of Systems  Constitutional: Negative for fever and chills.  HENT: Negative for congestion and sore  throat.   Respiratory: Positive for shortness of breath. Negative for cough and wheezing.   Cardiovascular: Negative for chest pain and palpitations.  Gastrointestinal: Positive for nausea, abdominal pain and constipation. Negative for heartburn, vomiting, diarrhea and blood in stool.  Genitourinary: Positive for frequency. Negative for dysuria.  Skin: Positive for itching. Negative for rash.  Neurological: Positive for dizziness and weakness. Negative for tingling and headaches.  Psychiatric/Behavioral: Negative for depression, suicidal ideas and substance abuse. The patient is not nervous/anxious.    Physical Exam   Blood pressure 133/83, pulse 83, temperature 99.4 F (37.4 C), temperature source Oral, resp. rate 20, height 5' (1.524 m), weight 116.03 kg (255 lb 12.8 oz), last menstrual period 07/21/2014, SpO2 100.00%.  Physical Exam   Constitutional: She is oriented to person, place, and time. She appears well-developed and well-nourished. No distress.  HENT:  Head: Normocephalic.  Eyes: EOM are normal.  Neck: Normal range of motion.  Cardiovascular: Normal rate and regular rhythm.   Respiratory: Effort normal and breath sounds normal. No respiratory distress.  GI: Soft. Bowel sounds are normal. She exhibits no distension. There is tenderness.  Obese pannus MIld tenderness in suprapubic region   Genitourinary:  White homogenous discharge in vagina Mucus present in cervical os (Nonpurulent)  Musculoskeletal: Normal range of motion.  Neurological: She is alert and oriented to person, place, and time.  Skin: Skin is warm and dry.  Psychiatric: She has a normal mood and affect. Her behavior is normal.   Results for orders placed during the hospital encounter of 08/05/14 (from the past 24 hour(s))  POCT PREGNANCY, URINE     Status: None   Collection Time    08/05/14 12:47 PM      Result Value Ref Range   Preg Test, Ur NEGATIVE  NEGATIVE  URINALYSIS, ROUTINE W REFLEX MICROSCOPIC     Status: Abnormal   Collection Time    08/05/14 12:48 PM      Result Value Ref Range   Color, Urine YELLOW  YELLOW   APPearance HAZY (*) CLEAR   Specific Gravity, Urine 1.025  1.005 - 1.030   pH 5.5  5.0 - 8.0   Glucose, UA NEGATIVE  NEGATIVE mg/dL   Hgb urine dipstick NEGATIVE  NEGATIVE   Bilirubin Urine NEGATIVE  NEGATIVE   Ketones, ur NEGATIVE  NEGATIVE mg/dL   Protein, ur NEGATIVE  NEGATIVE mg/dL   Urobilinogen, UA 0.2  0.0 - 1.0 mg/dL   Nitrite NEGATIVE  NEGATIVE   Leukocytes, UA NEGATIVE  NEGATIVE  WET PREP, GENITAL     Status: Abnormal   Collection Time    08/05/14  2:15 PM      Result Value Ref Range   Yeast Wet Prep HPF POC NONE SEEN  NONE SEEN   Trich, Wet Prep NONE SEEN  NONE SEEN   Clue Cells Wet Prep HPF POC NONE SEEN  NONE SEEN   WBC, Wet Prep HPF POC FEW (*) NONE SEEN    MAU Course   Procedures None  MDM Wet prep is negative.  Neg U/A  Pain responded well to Toradol in MAU  Assessment and Plan  A: Pelvic pain in the setting of recent hospital admission   P: Discharge to home Follow up as scheduled in GYN clinic tomorrow GC/Chlam pending Toradol  po for pain - at home.   Do not take Naprosyn/other anti-inflammatories MAU for emergencies  Teague Edwena Blow 08/05/2014, 1:57 PM

## 2014-08-05 NOTE — Discharge Instructions (Signed)

## 2014-08-05 NOTE — MAU Note (Signed)
Patient states she had been hospitalized for PID and went home with pain management and antibiotics. Was feeling better after discharge but started to have the same kind of pelvic pain on Monday 9-7 and the pain medication is not helping. States she took all her antibiotic. Some spotting off and on, none today.

## 2014-08-06 ENCOUNTER — Telehealth: Payer: Self-pay | Admitting: *Deleted

## 2014-08-06 ENCOUNTER — Ambulatory Visit: Payer: Self-pay | Admitting: Obstetrics and Gynecology

## 2014-08-06 LAB — GC/CHLAMYDIA PROBE AMP
CT PROBE, AMP APTIMA: POSITIVE — AB
GC Probe RNA: NEGATIVE

## 2014-08-12 ENCOUNTER — Encounter: Payer: Self-pay | Admitting: Obstetrics and Gynecology

## 2014-08-14 ENCOUNTER — Encounter: Payer: Self-pay | Admitting: Obstetrics & Gynecology

## 2014-08-27 ENCOUNTER — Telehealth (HOSPITAL_BASED_OUTPATIENT_CLINIC_OR_DEPARTMENT_OTHER): Payer: Self-pay | Admitting: Emergency Medicine

## 2014-09-23 ENCOUNTER — Ambulatory Visit: Payer: Self-pay | Admitting: Obstetrics and Gynecology

## 2014-09-23 ENCOUNTER — Encounter: Payer: Self-pay | Admitting: General Practice

## 2014-09-23 ENCOUNTER — Telehealth: Payer: Self-pay | Admitting: General Practice

## 2014-09-23 NOTE — Telephone Encounter (Signed)
Patient no showed for appt. Called patient, no answer- left message that we are calling because it appears she missed an appt with us today, please call our front office staff to reschedule this appt. Will send letter

## 2014-10-13 NOTE — Telephone Encounter (Signed)
Pt notified of missed appointment.

## 2014-10-15 ENCOUNTER — Emergency Department (HOSPITAL_COMMUNITY)
Admission: EM | Admit: 2014-10-15 | Discharge: 2014-10-15 | Disposition: A | Discharge status: Home / Self Care | Payer: Medicaid Other | Attending: Emergency Medicine | Admitting: Emergency Medicine

## 2014-10-15 ENCOUNTER — Encounter (HOSPITAL_COMMUNITY): Payer: Self-pay | Admitting: Emergency Medicine

## 2014-10-15 DIAGNOSIS — Z8619 Personal history of other infectious and parasitic diseases: Secondary | ICD-10-CM | POA: Insufficient documentation

## 2014-10-15 DIAGNOSIS — H9209 Otalgia, unspecified ear: Secondary | ICD-10-CM | POA: Insufficient documentation

## 2014-10-15 DIAGNOSIS — Z8742 Personal history of other diseases of the female genital tract: Secondary | ICD-10-CM | POA: Diagnosis not present

## 2014-10-15 DIAGNOSIS — K088 Other specified disorders of teeth and supporting structures: Secondary | ICD-10-CM | POA: Diagnosis present

## 2014-10-15 DIAGNOSIS — K0889 Other specified disorders of teeth and supporting structures: Secondary | ICD-10-CM

## 2014-10-15 DIAGNOSIS — R51 Headache: Secondary | ICD-10-CM | POA: Insufficient documentation

## 2014-10-15 DIAGNOSIS — Z79899 Other long term (current) drug therapy: Secondary | ICD-10-CM | POA: Diagnosis not present

## 2014-10-15 DIAGNOSIS — F329 Major depressive disorder, single episode, unspecified: Secondary | ICD-10-CM | POA: Insufficient documentation

## 2014-10-15 MED ORDER — PENICILLIN V POTASSIUM 250 MG PO TABS: 250.0000 mg | ORAL_TABLET | Freq: Four times a day (QID) | ORAL | Status: AC

## 2014-10-15 NOTE — ED Provider Notes (Signed)
CSN: [id number]     Arrival date & time 10/15/14  1737 History  This chart was scribed for non-physician practitioner working with Layla MawKristen N Ward, DO by Elveria Risingimelie Horne, ED Scribe. This patient was seen in room TR06C/TR06C and the patient's care was started at 6:13 PM.   Chief Complaint  Patient presents with  . Dental Pain  . Otalgia  . Headache    The history is provided by the patient. No language interpreter was used.   HPI Comments: Alexandra Gray is a 23 y.o. female who presents to the Emergency Department with chief complaint of right lower dental pain persisting for four days. Patient reports worsening pain as the days have progressed, associated ear pain and headache. Patient reports attempted treatment with ibuprofen, which has only provided temporary relief. Patient reports seeing her dentist 2 months ago, but she was not having her current pain, but her dentist identified a "split tooth."   Past Medical History  Diagnosis Date  . Mental disorder   . Depression   . Sleep apnea   . PID (pelvic inflammatory disease)   . Gonorrhea   . Chlamydia    Past Surgical History  Procedure Laterality Date  . Knee surgery     No family history on file. History  Substance Use Topics  . Smoking status: Never Smoker   . Smokeless tobacco: Not on file  . Alcohol Use: No   OB History    Gravida Para Term Preterm AB TAB SAB Ectopic Multiple Living   0              Review of Systems  Constitutional: Negative for fever and chills.  HENT: Positive for dental problem and ear pain. Negative for sore throat and trouble swallowing.   Neurological: Positive for headaches.      Allergies  Review of patient's allergies indicates no known allergies.  Home Medications   Prior to Admission medications   Medication Sig Start Date End Date Taking? Authorizing Provider  docusate sodium (COLACE) 100 MG capsule Take 1 capsule (100 mg total) by mouth 2 (two) times daily as needed.  07/22/14   Tereso NewcomerUgonna A Anyanwu, MD  DULoxetine (CYMBALTA) 60 MG capsule Take 60 mg by mouth daily.    Historical Provider, MD  ketorolac (TORADOL) 10 MG tablet Take 1 tablet (10 mg total) by mouth every 6 (six) hours as needed. 08/05/14   Duane BostonKaren E Teague Clark, PA-C  ondansetron (ZOFRAN) 4 MG tablet Take 1 tablet (4 mg total) by mouth every 6 (six) hours as needed for nausea. 07/22/14   Tereso NewcomerUgonna A Anyanwu, MD  oxyCODONE-acetaminophen (PERCOCET/ROXICET) 5-325 MG per tablet Take 1-2 tablets by mouth every 3 (three) hours as needed for severe pain (moderate to severe pain (when tolerating fluids)). 07/22/14   Tereso NewcomerUgonna A Anyanwu, MD  promethazine (PHENERGAN) 25 MG tablet Take 1 tablet (25 mg total) by mouth every 6 (six) hours as needed for nausea or vomiting. 07/22/14   Tereso NewcomerUgonna A Anyanwu, MD  traZODone (DESYREL) 100 MG tablet Take 100 mg by mouth at bedtime as needed for sleep.     Historical Provider, MD   Triage Vitals: BP 136/58 mmHg  Pulse 97  Temp(Src) 98.4 F (36.9 C) (Oral)  Resp 18  SpO2 100%  LMP 09/27/2014 Physical Exam  Constitutional: She is oriented to person, place, and time. She appears well-developed and well-nourished. No distress.  HENT:  Head: Normocephalic and atraumatic.  Mouth/Throat:    Affected tooth as diagrammed.  No signs of peritonsillar or tonsillar abscess.  No signs of gingival abscess. Oropharynx is clear and without exudates.  Uvula is midline.  Airway is intact. No signs of Ludwig's angina with palpation of oral and sublingual mucosa.   Eyes: Conjunctivae and EOM are normal.  Neck: Normal range of motion. Neck supple. No tracheal deviation present.  Cardiovascular: Normal rate.   Pulmonary/Chest: Effort normal. No respiratory distress.  Abdominal: She exhibits no distension.  Musculoskeletal: Normal range of motion.  Lymphadenopathy:    She has no cervical adenopathy.  Neurological: She is alert and oriented to person, place, and time.  Skin: Skin is warm and dry.   Psychiatric: She has a normal mood and affect. Her behavior is normal. Judgment and thought content normal.  Nursing note and vitals reviewed.   ED Course  Procedures (including critical care time)  COORDINATION OF CARE: 6:13 PM- Discussed treatment plan with patient at bedside and patient agreed to plan.   Labs Review Labs Reviewed - No data to display  Imaging Review No results found.   EKG Interpretation None      MDM   Final diagnoses:  Pain, dental    Patient with toothache.  No gross abscess.  Exam unconcerning for Ludwig's angina or spread of infection.  Will treat with penicillin.  Urged patient to follow-up with dentist.     I personally performed the services described in this documentation, which was scribed in my presence. The recorded information has been reviewed and is accurate.    Roxy Horsemanobert Apolonio Cutting, PA-C 10/15/14 1819  Layla MawKristen N Ward, DO 10/15/14 2045

## 2014-10-15 NOTE — Discharge Instructions (Signed)

## 2014-10-15 NOTE — ED Notes (Signed)
Pt c/o 10/10 RT sided dental pain, ear pain, and headache since Monday. Pt states she saw a dentist a few months ago who stated she has a "split tooth." Pt denies injury. Airway patent. NAD noted at this time.

## 2014-10-17 ENCOUNTER — Other Ambulatory Visit: Payer: Self-pay | Admitting: Obstetrics & Gynecology

## 2014-11-09 ENCOUNTER — Encounter (HOSPITAL_COMMUNITY): Payer: Self-pay | Admitting: Emergency Medicine

## 2014-11-09 ENCOUNTER — Emergency Department (HOSPITAL_COMMUNITY)
Admission: EM | Admit: 2014-11-09 | Discharge: 2014-11-10 | Disposition: A | Discharge status: Home / Self Care | Payer: Medicaid Other | Attending: Emergency Medicine | Admitting: Emergency Medicine

## 2014-11-09 ENCOUNTER — Emergency Department (HOSPITAL_COMMUNITY): Payer: Medicaid Other

## 2014-11-09 DIAGNOSIS — R1110 Vomiting, unspecified: Secondary | ICD-10-CM | POA: Diagnosis not present

## 2014-11-09 DIAGNOSIS — Z8619 Personal history of other infectious and parasitic diseases: Secondary | ICD-10-CM | POA: Diagnosis not present

## 2014-11-09 DIAGNOSIS — R109 Unspecified abdominal pain: Secondary | ICD-10-CM | POA: Diagnosis not present

## 2014-11-09 DIAGNOSIS — G4730 Sleep apnea, unspecified: Secondary | ICD-10-CM | POA: Insufficient documentation

## 2014-11-09 DIAGNOSIS — Z79899 Other long term (current) drug therapy: Secondary | ICD-10-CM | POA: Diagnosis not present

## 2014-11-09 DIAGNOSIS — F329 Major depressive disorder, single episode, unspecified: Secondary | ICD-10-CM | POA: Insufficient documentation

## 2014-11-09 DIAGNOSIS — Z8742 Personal history of other diseases of the female genital tract: Secondary | ICD-10-CM | POA: Diagnosis not present

## 2014-11-09 DIAGNOSIS — R52 Pain, unspecified: Secondary | ICD-10-CM

## 2014-11-09 DIAGNOSIS — R079 Chest pain, unspecified: Secondary | ICD-10-CM

## 2014-11-09 DIAGNOSIS — R0789 Other chest pain: Secondary | ICD-10-CM | POA: Insufficient documentation

## 2014-11-09 DIAGNOSIS — Z3202 Encounter for pregnancy test, result negative: Secondary | ICD-10-CM | POA: Insufficient documentation

## 2014-11-09 DIAGNOSIS — R05 Cough: Secondary | ICD-10-CM | POA: Diagnosis not present

## 2014-11-09 LAB — BASIC METABOLIC PANEL
ANION GAP: 13 (ref 5–15)
BUN: 11 mg/dL (ref 6–23)
CHLORIDE: 102 meq/L (ref 96–112)
CO2: 24 meq/L (ref 19–32)
Calcium: 9.5 mg/dL (ref 8.4–10.5)
Creatinine, Ser: 0.6 mg/dL (ref 0.50–1.10)
GFR calc Af Amer: >90 mL/min (ref >90)
GFR calc non Af Amer: >90 mL/min (ref >90)
Glucose, Bld: 92 mg/dL (ref 70–99)
POTASSIUM: 4.2 meq/L (ref 3.7–5.3)
Sodium: 139 mEq/L (ref 137–147)

## 2014-11-09 LAB — CBC WITH DIFFERENTIAL/PLATELET
BASOS ABS: 0 10*3/uL (ref 0.0–0.1)
Basophils Relative: 0 % (ref 0–1)
Eosinophils Absolute: 0.2 10*3/uL (ref 0.0–0.7)
Eosinophils Relative: 2 % (ref 0–5)
HCT: 42 % (ref 36.0–46.0)
Hemoglobin: 13.5 g/dL (ref 12.0–15.0)
LYMPHS PCT: 31 % (ref 12–46)
Lymphs Abs: 2.9 10*3/uL (ref 0.7–4.0)
MCH: 26.5 pg (ref 26.0–34.0)
MCHC: 32.1 g/dL (ref 30.0–36.0)
MCV: 82.5 fL (ref 78.0–100.0)
Monocytes Absolute: 0.5 10*3/uL (ref 0.1–1.0)
Monocytes Relative: 6 % (ref 3–12)
NEUTROS ABS: 5.8 10*3/uL (ref 1.7–7.7)
Neutrophils Relative %: 61 % (ref 43–77)
PLATELETS: 331 10*3/uL (ref 150–400)
RBC: 5.09 MIL/uL (ref 3.87–5.11)
RDW: 13.9 % (ref 11.5–15.5)
WBC: 9.4 10*3/uL (ref 4.0–10.5)

## 2014-11-09 LAB — I-STAT BETA HCG BLOOD, ED (MC, WL, AP ONLY)

## 2014-11-09 LAB — I-STAT TROPONIN, ED: Troponin i, poc: 0 ng/mL (ref 0.00–0.08)

## 2014-11-09 IMAGING — DX DG CHEST 2V
2 series · 2 of 2 positions shown · non-contrast
Comparison: [DATE].

CLINICAL DATA: Chest pain, shortness of breath.

EXAM:
CHEST  2 VIEW

[chest pa]
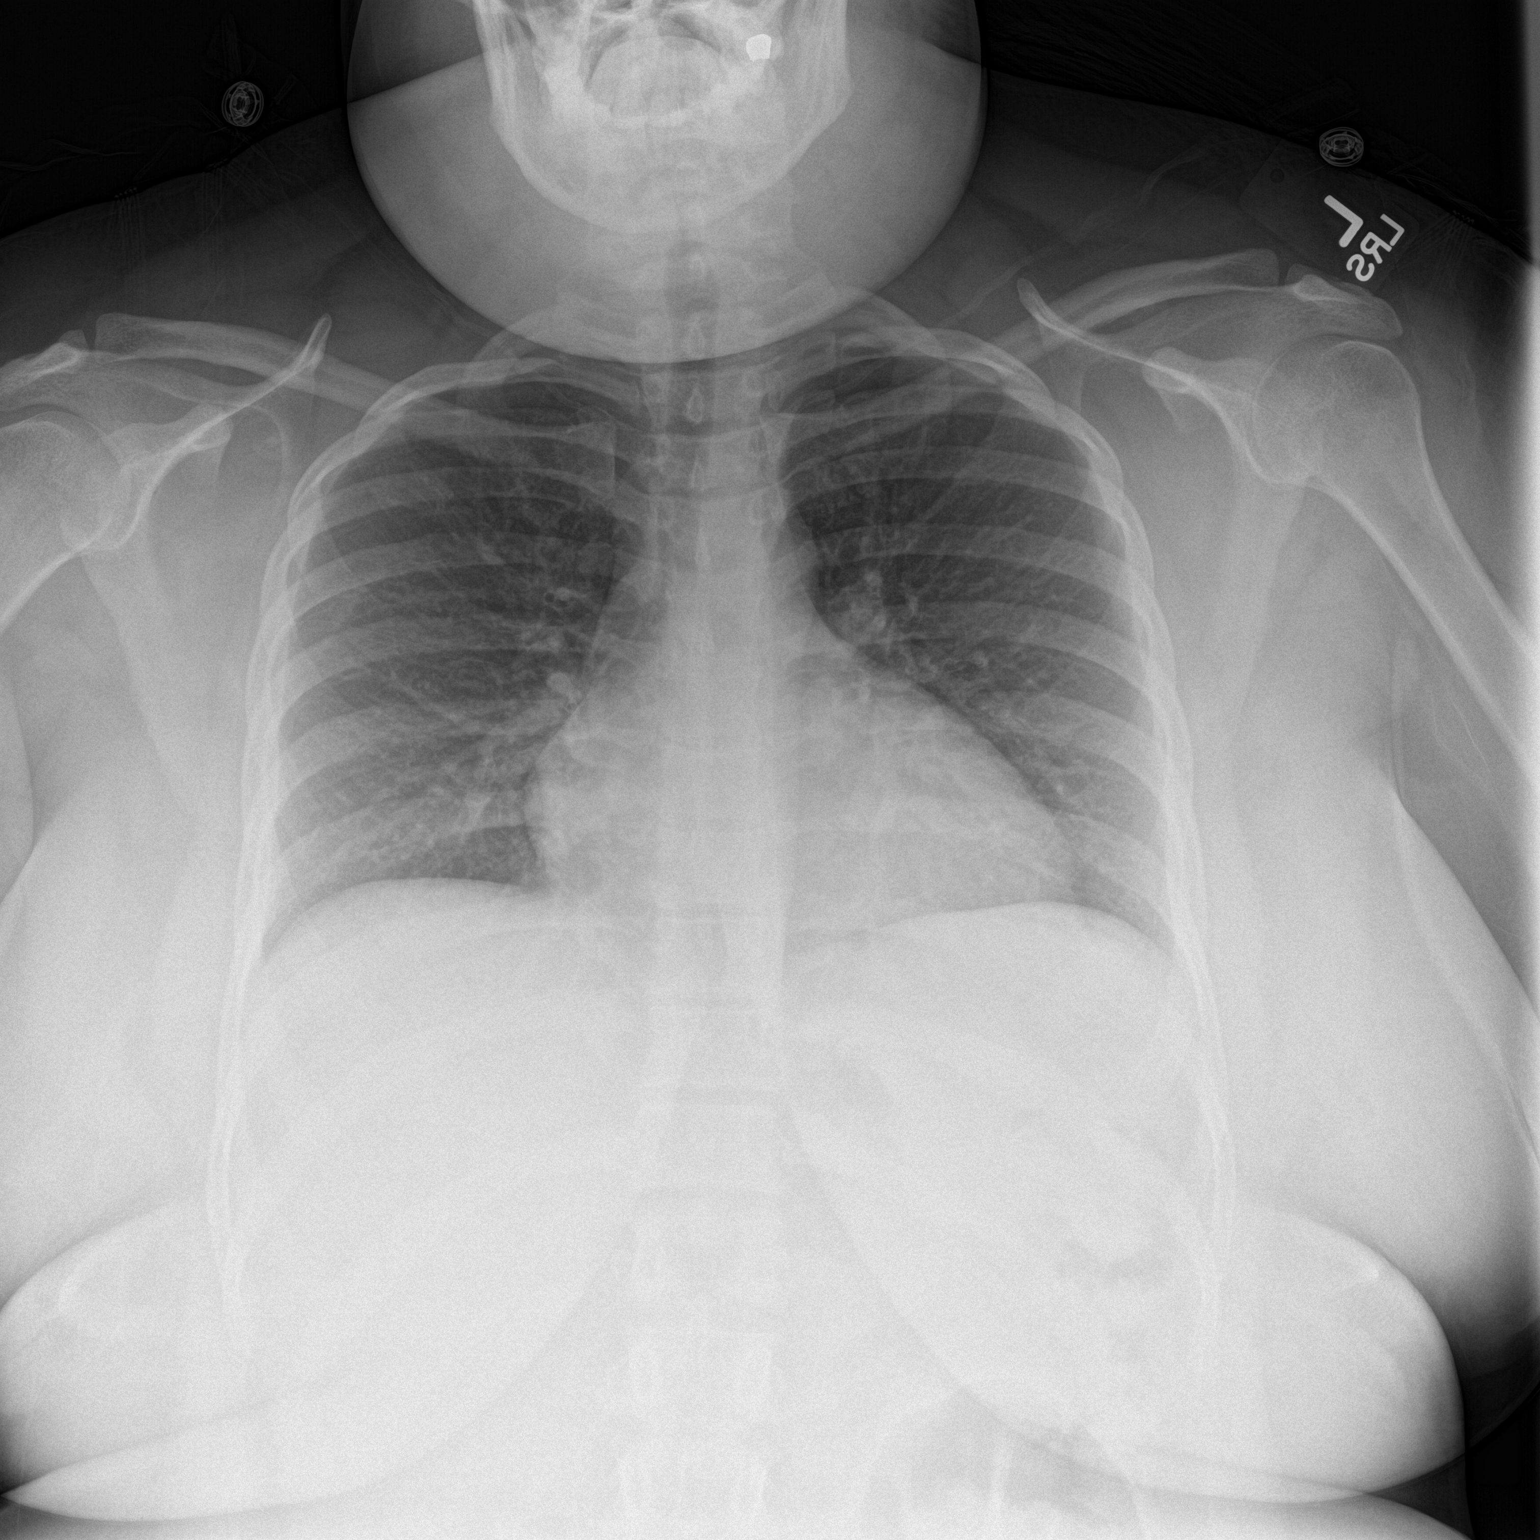

[chest lat]
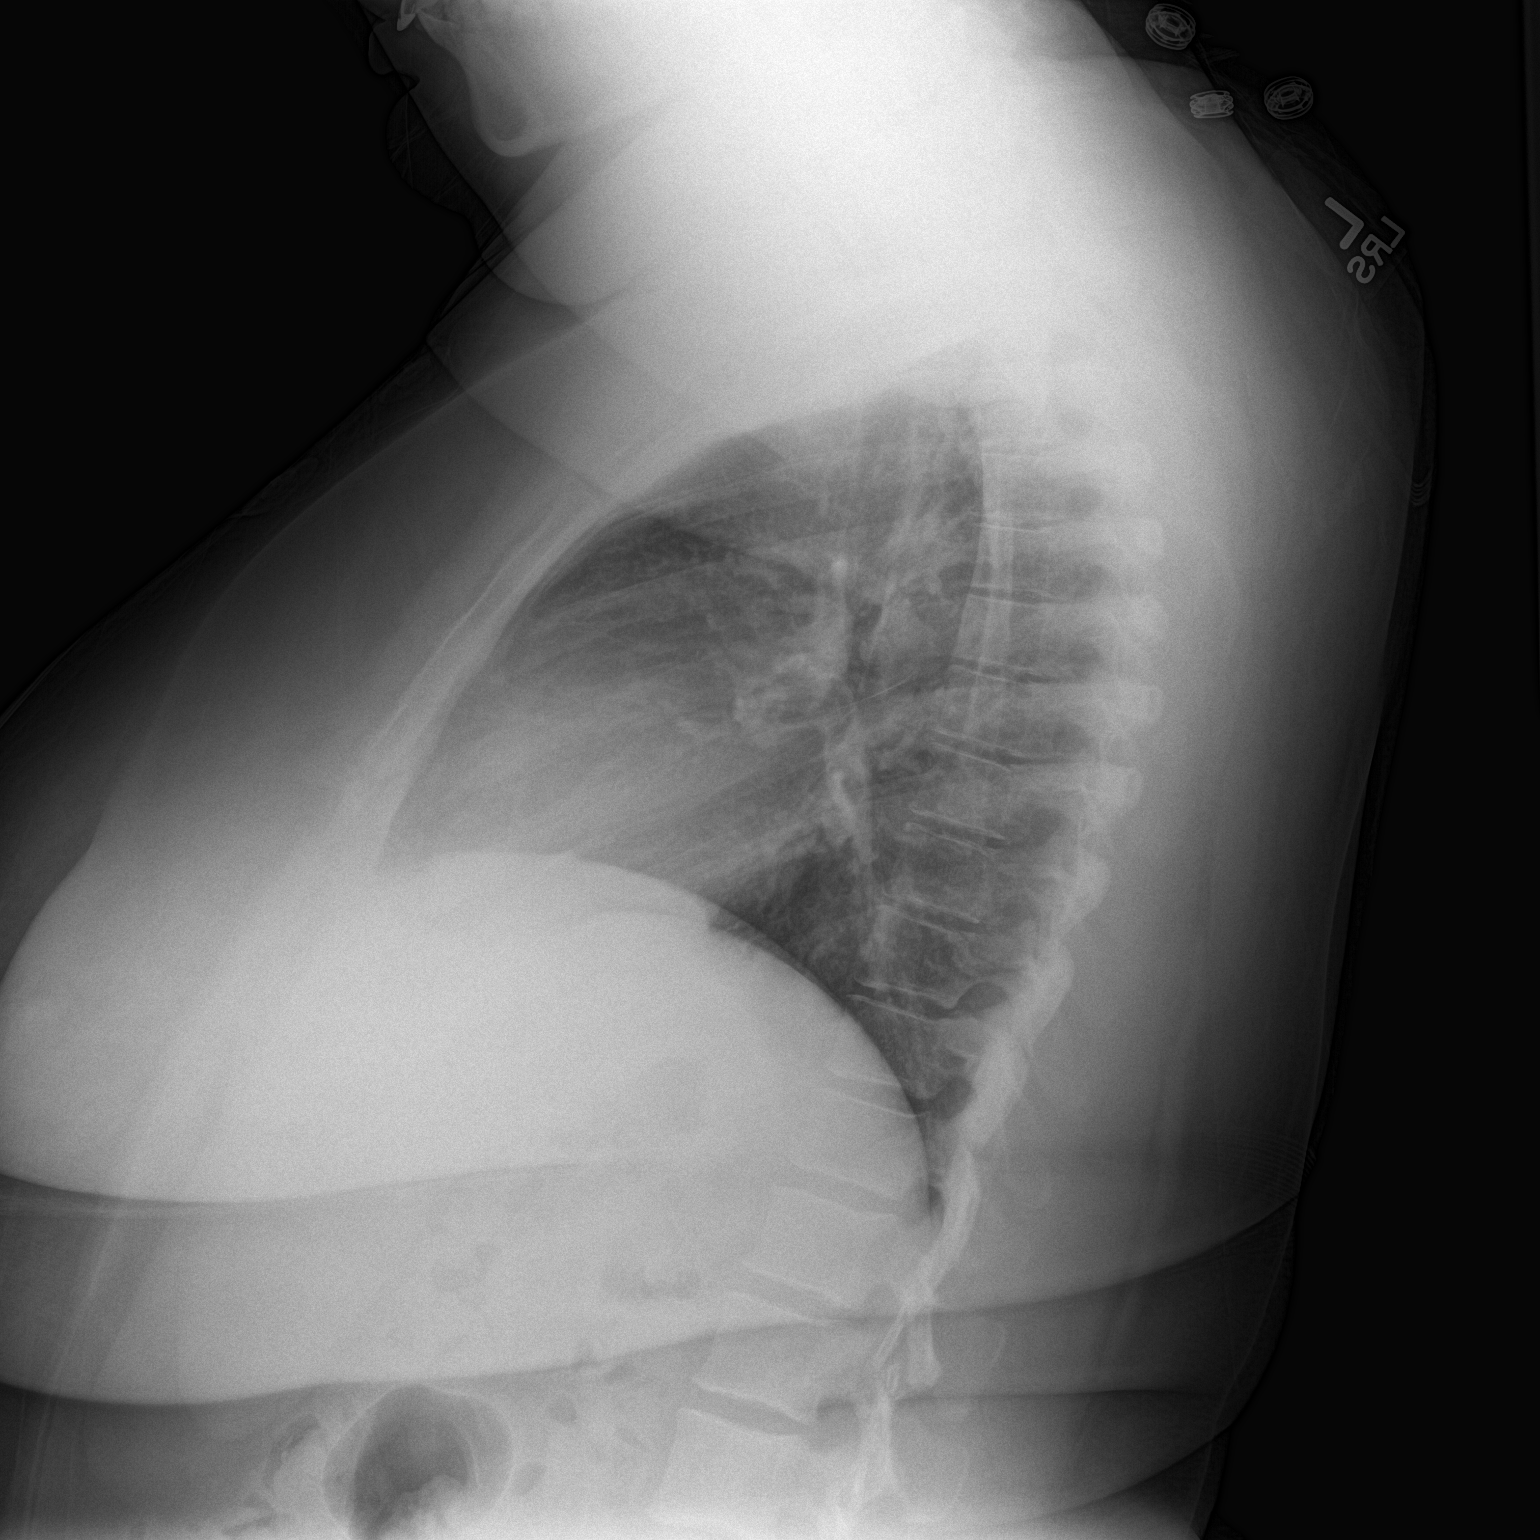

[2 of 2 positions shown; findings below may reference images not displayed]

FINDINGS: The heart size and mediastinal contours are within normal limits.
Both lungs are clear. No pneumothorax or pleural effusion is noted.
The visualized skeletal structures are unremarkable.
IMPRESSION: No acute cardiopulmonary abnormality seen.

## 2014-11-09 MED ORDER — HYDROCODONE-ACETAMINOPHEN 5-325 MG PO TABS
1.0000 | ORAL_TABLET | ORAL | Status: AC
Start: 2014-11-09 — End: 2014-11-10
  Administered 2014-11-10: 1 via ORAL
  Filled 2014-11-09: qty 1

## 2014-11-09 NOTE — ED Notes (Signed)
Pt reports chest pain and abdominal pain x1 week with 3 cases of emesis in the past 24 hours with scant blood.  Pt reports SOB.  Pt currently having CP.  Pt has no cardiac hx.

## 2014-11-09 NOTE — ED Notes (Signed)
Pt reports lower abdominal pain and cp x1 week. Also c/o nvd since yesterday. Pt sts she has been having hot flashes as well.

## 2014-11-09 NOTE — ED Provider Notes (Signed)
CSN: [id number]     Arrival date & time 11/09/14  1858 History   None    This chart was scribed for Linwood DibblesJon Toshio Slusher, MD by Arlan OrganAshley Leger, ED Scribe. This patient was seen in room A12C/A12C and the patient's care was started 11:20 PM.   Chief Complaint  Patient presents with  . Chest Pain  . Abdominal Pain   HPI  HPI Comments: Carvel GettingCierra R Mefferd is a 23 y.o. female wwho presents to the Emergency Department complaining of constant R sided chest pain x 2 weeks that has progressively worsened. Pain is exacerbated with swallowing without any alleviating factors at this time. She also reports abdominal pain, cough, and sore throat. Pt mentions ongoing vomiting with small amounts of blood onset 1 week; 3 episodes of vomiting in last 24 hours. Pt has tried OTC medications without any improvement for symptoms. Ms. Lily PeerFernandez denies any vaginal discharge, vaginal bleeding, rash, or leg swelling. No history of blood clots. No known sick contacts. No known allergies to medications.   Past Medical History  Diagnosis Date  . Mental disorder   . Depression   . Sleep apnea   . PID (pelvic inflammatory disease)   . Gonorrhea   . Chlamydia    Past Surgical History  Procedure Laterality Date  . Knee surgery     No family history on file. History  Substance Use Topics  . Smoking status: Never Smoker   . Smokeless tobacco: Not on file  . Alcohol Use: No   OB History    Gravida Para Term Preterm AB TAB SAB Ectopic Multiple Living   0              Review of Systems  Constitutional: Negative for fever and chills.  Respiratory: Positive for cough.   Cardiovascular: Positive for chest pain. Negative for leg swelling.  Gastrointestinal: Positive for vomiting and abdominal pain.  Genitourinary: Negative for vaginal bleeding and vaginal discharge.  Skin: Negative for rash.  All other systems reviewed and are negative.     Allergies  Review of patient's allergies indicates no known allergies.  Home  Medications   Prior to Admission medications   Medication Sig Start Date End Date Taking? Authorizing Provider  docusate sodium (COLACE) 100 MG capsule Take 1 capsule (100 mg total) by mouth 2 (two) times daily as needed. 07/22/14   Tereso NewcomerUgonna A Anyanwu, MD  DULoxetine (CYMBALTA) 60 MG capsule Take 60 mg by mouth daily.    Historical Provider, MD  ketorolac (TORADOL) 10 MG tablet Take 1 tablet (10 mg total) by mouth every 6 (six) hours as needed. 08/05/14   Duane BostonKaren E Teague Clark, PA-C  ondansetron (ZOFRAN) 4 MG tablet Take 1 tablet (4 mg total) by mouth every 6 (six) hours as needed for nausea. 07/22/14   Tereso NewcomerUgonna A Anyanwu, MD  oxyCODONE-acetaminophen (PERCOCET/ROXICET) 5-325 MG per tablet Take 1-2 tablets by mouth every 3 (three) hours as needed for severe pain (moderate to severe pain (when tolerating fluids)). 07/22/14   Tereso NewcomerUgonna A Anyanwu, MD  promethazine (PHENERGAN) 25 MG tablet Take 1 tablet (25 mg total) by mouth every 6 (six) hours as needed for nausea or vomiting. 07/22/14   Tereso NewcomerUgonna A Anyanwu, MD  traZODone (DESYREL) 100 MG tablet Take 100 mg by mouth at bedtime as needed for sleep.     Historical Provider, MD   Triage Vitals: BP 142/95 mmHg  Pulse 84  Temp(Src) 98.5 F (36.9 C) (Oral)  Resp 19  Ht 5\' 1"  (1.549  m)  Wt 235 lb (106.595 kg)  BMI 44.43 kg/m2  SpO2 99%  LMP 10/31/2014   Physical Exam  Constitutional: She appears well-developed and well-nourished. No distress.  HENT:  Head: Normocephalic and atraumatic.  Right Ear: External ear normal.  Left Ear: External ear normal.  Mouth/Throat: No oropharyngeal exudate.  Eyes: Conjunctivae are normal. Right eye exhibits no discharge. Left eye exhibits no discharge. No scleral icterus.  Neck: Neck supple. No tracheal deviation present.  Cardiovascular: Normal rate, regular rhythm and intact distal pulses.   Pulmonary/Chest: Effort normal and breath sounds normal. No stridor. No respiratory distress. She has no wheezes. She has no rales. She  exhibits tenderness.  Abdominal: Soft. Bowel sounds are normal. She exhibits no distension. There is no tenderness. There is no rebound and no guarding.  Musculoskeletal: She exhibits no edema or tenderness.  Neurological: She is alert. She has normal strength. No cranial nerve deficit (no facial droop, extraocular movements intact, no slurred speech) or sensory deficit. She exhibits normal muscle tone. She displays no seizure activity. Coordination normal.  Skin: Skin is warm and dry. No rash noted.  Psychiatric: She has a normal mood and affect.  Nursing note and vitals reviewed.   ED Course  Procedures (including critical care time)  DIAGNOSTIC STUDIES: Oxygen Saturation is 99% on RA, Normal by my interpretation.    COORDINATION OF CARE: 11:24 PM- Will order blood work and CXR. Will give Norco here in ED to manage pain. Discussed treatment plan with pt at bedside and pt agreed to plan.     Labs Review Labs Reviewed  URINALYSIS, ROUTINE W REFLEX MICROSCOPIC - Abnormal; Notable for the following:    APPearance CLOUDY (*)    All other components within normal limits  HEPATIC FUNCTION PANEL - Abnormal; Notable for the following:    Total Bilirubin <0.2 (*)    All other components within normal limits  BASIC METABOLIC PANEL  CBC WITH DIFFERENTIAL  LIPASE, BLOOD  D-DIMER, QUANTITATIVE  I-STAT TROPOININ, ED  POC URINE PREG, ED  I-STAT BETA HCG BLOOD, ED ([facility], [facility], [facility] ONLY)    Imaging Review Dg Chest 2 View  11/10/2014   CLINICAL DATA:  Chest pain, shortness of breath.  EXAM: CHEST  2 VIEW  COMPARISON:  May 28, 2012.  FINDINGS: The heart size and mediastinal contours are within normal limits. Both lungs are clear. No pneumothorax or pleural effusion is noted. The visualized skeletal structures are unremarkable.  IMPRESSION: No acute cardiopulmonary abnormality seen.   Electronically Signed   By: Roque Lias M.D.   On: 11/10/2014 01:33     EKG Interpretation   Date/Time:  Monday  November 09 2014 19:07:48 EST Ventricular Rate:  102 PR Interval:  158 QRS Duration: 88 QT Interval:  350 QTC Calculation: 456 R Axis:   88 Text Interpretation:  Sinus tachycardia Otherwise normal ECG Since last  tracing rate faster Confirmed by Toby Ayad  MD-J, Jeovanny Cuadros ([id number]) on 11/09/2014  11:22:34 PM      MDM   Final diagnoses:  Chest pain, unspecified chest pain type    Pt presented with chest and abdominal pain.  On exam no abdominal ttp.   ED evaluation unremarkable.  Doubt biliary colic based on exam.  No clear relation to oral intake.  Will dc home with nsaids.  Follow up with PCP.  Monitor for fever, worsening symptoms  I personally performed the services described in this documentation, which was scribed in my presence. The recorded information has  been reviewed and is accurate.    Linwood DibblesJon Jamair Cato, MD 11/10/14 [id number]

## 2014-11-10 LAB — POC URINE PREG, ED: PREG TEST UR: NEGATIVE

## 2014-11-10 LAB — URINALYSIS, ROUTINE W REFLEX MICROSCOPIC
Bilirubin Urine: NEGATIVE
Glucose, UA: NEGATIVE mg/dL
Hgb urine dipstick: NEGATIVE
Ketones, ur: NEGATIVE mg/dL
LEUKOCYTES UA: NEGATIVE
NITRITE: NEGATIVE
PH: 5 (ref 5.0–8.0)
Protein, ur: NEGATIVE mg/dL
SPECIFIC GRAVITY, URINE: 1.015 (ref 1.005–1.030)
Urobilinogen, UA: 0.2 mg/dL (ref 0.0–1.0)

## 2014-11-10 LAB — LIPASE, BLOOD: Lipase: 21 U/L (ref 11–59)

## 2014-11-10 LAB — HEPATIC FUNCTION PANEL
ALT: 22 U/L (ref 0–35)
AST: 25 U/L (ref 0–37)
Albumin: 3.9 g/dL (ref 3.5–5.2)
Alkaline Phosphatase: 83 U/L (ref 39–117)
Bilirubin, Direct: <0.2 mg/dL (ref 0.0–0.3)
TOTAL PROTEIN: 7.9 g/dL (ref 6.0–8.3)

## 2014-11-10 LAB — D-DIMER, QUANTITATIVE: D-Dimer, Quant: 0.4 ug/mL-FEU (ref 0.00–0.48)

## 2014-11-10 MED ORDER — NAPROXEN 500 MG PO TABS: 500.0000 mg | ORAL_TABLET | Freq: Two times a day (BID) | ORAL | Status: DC

## 2014-11-10 NOTE — Discharge Instructions (Signed)

## 2014-11-27 HISTORY — DX: Maternal care for unspecified type scar from previous cesarean delivery: O34.219

## 2015-01-08 ENCOUNTER — Ambulatory Visit: Payer: Self-pay | Admitting: Obstetrics & Gynecology

## 2015-01-08 ENCOUNTER — Encounter: Payer: Self-pay | Admitting: *Deleted

## 2015-01-08 ENCOUNTER — Telehealth: Payer: Self-pay | Admitting: *Deleted

## 2015-01-08 NOTE — Telephone Encounter (Signed)
Alexandra Gray missed a scheduled appointment today - per notes states for tdap/pap smear/ flu shot but per chart never got PID follow up.  Attempted to call [name] but no phone number available. Will send letter.

## 2015-02-10 ENCOUNTER — Encounter: Payer: Self-pay | Admitting: Obstetrics & Gynecology

## 2015-02-11 ENCOUNTER — Encounter (HOSPITAL_COMMUNITY): Payer: Self-pay | Admitting: Emergency Medicine

## 2015-02-11 ENCOUNTER — Emergency Department (HOSPITAL_COMMUNITY): Payer: Medicaid Other

## 2015-02-11 ENCOUNTER — Emergency Department (HOSPITAL_COMMUNITY)
Admission: EM | Admit: 2015-02-11 | Discharge: 2015-02-11 | Disposition: A | Discharge status: Home / Self Care | Payer: Medicaid Other | Attending: Emergency Medicine | Admitting: Emergency Medicine

## 2015-02-11 DIAGNOSIS — O23599 Infection of other part of genital tract in pregnancy, unspecified trimester: Secondary | ICD-10-CM | POA: Insufficient documentation

## 2015-02-11 DIAGNOSIS — Z791 Long term (current) use of non-steroidal anti-inflammatories (NSAID): Secondary | ICD-10-CM | POA: Diagnosis not present

## 2015-02-11 DIAGNOSIS — B9689 Other specified bacterial agents as the cause of diseases classified elsewhere: Secondary | ICD-10-CM

## 2015-02-11 DIAGNOSIS — F329 Major depressive disorder, single episode, unspecified: Secondary | ICD-10-CM | POA: Insufficient documentation

## 2015-02-11 DIAGNOSIS — Z3A00 Weeks of gestation of pregnancy not specified: Secondary | ICD-10-CM | POA: Insufficient documentation

## 2015-02-11 DIAGNOSIS — Z79899 Other long term (current) drug therapy: Secondary | ICD-10-CM | POA: Diagnosis not present

## 2015-02-11 DIAGNOSIS — N760 Acute vaginitis: Secondary | ICD-10-CM

## 2015-02-11 DIAGNOSIS — O9989 Other specified diseases and conditions complicating pregnancy, childbirth and the puerperium: Secondary | ICD-10-CM | POA: Diagnosis present

## 2015-02-11 DIAGNOSIS — R102 Pelvic and perineal pain: Secondary | ICD-10-CM | POA: Insufficient documentation

## 2015-02-11 DIAGNOSIS — O99340 Other mental disorders complicating pregnancy, unspecified trimester: Secondary | ICD-10-CM | POA: Diagnosis not present

## 2015-02-11 DIAGNOSIS — Z8619 Personal history of other infectious and parasitic diseases: Secondary | ICD-10-CM | POA: Diagnosis not present

## 2015-02-11 DIAGNOSIS — R109 Unspecified abdominal pain: Secondary | ICD-10-CM

## 2015-02-11 LAB — URINALYSIS, ROUTINE W REFLEX MICROSCOPIC
BILIRUBIN URINE: NEGATIVE
GLUCOSE, UA: NEGATIVE mg/dL
Hgb urine dipstick: NEGATIVE
Ketones, ur: NEGATIVE mg/dL
Leukocytes, UA: NEGATIVE
Nitrite: NEGATIVE
PROTEIN: NEGATIVE mg/dL
Specific Gravity, Urine: 1.023 (ref 1.005–1.030)
UROBILINOGEN UA: 0.2 mg/dL (ref 0.0–1.0)
pH: 6.5 (ref 5.0–8.0)

## 2015-02-11 LAB — WET PREP, GENITAL
TRICH WET PREP: NONE SEEN
Yeast Wet Prep HPF POC: NONE SEEN

## 2015-02-11 LAB — POC URINE PREG, ED: Preg Test, Ur: POSITIVE — AB

## 2015-02-11 LAB — HCG, QUANTITATIVE, PREGNANCY: hCG, Beta Chain, Quant, S: 292 m[IU]/mL — ABNORMAL HIGH (ref <5)

## 2015-02-11 IMAGING — US US OB COMP LESS 14 WK
1 series · 14 of 28 positions shown · non-contrast
Comparison: Ultrasound [DATE].

CLINICAL DATA: Abdominal pain, first trimester of pregnancy.

EXAM:
OBSTETRIC <14 WK US AND TRANSVAGINAL OB US
TECHNIQUE: Both transabdominal and transvaginal ultrasound examinations were
performed for complete evaluation of the gestation as well as the
maternal uterus, adnexal regions, and pelvic cul-de-sac.
Transvaginal technique was performed to assess early pregnancy.

[Series 1: us ob comp less 14 wk · 0.27mm/px · 58 acquisitions, 14 frames shown]
[im 3/58]
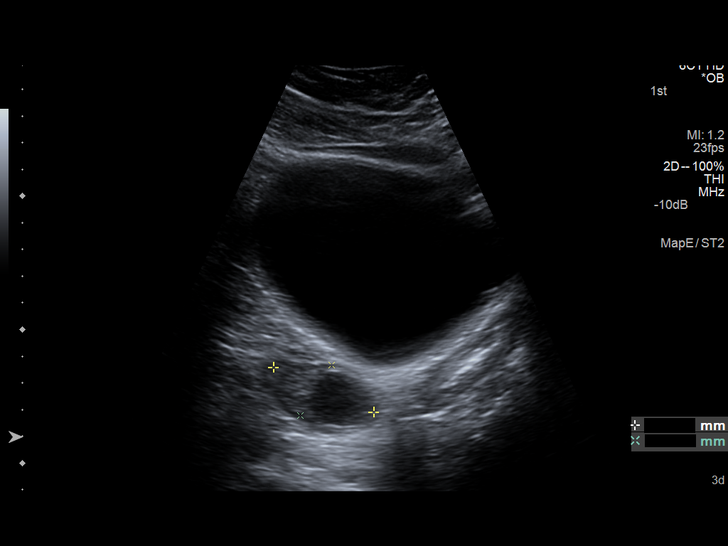
[im 7/58]
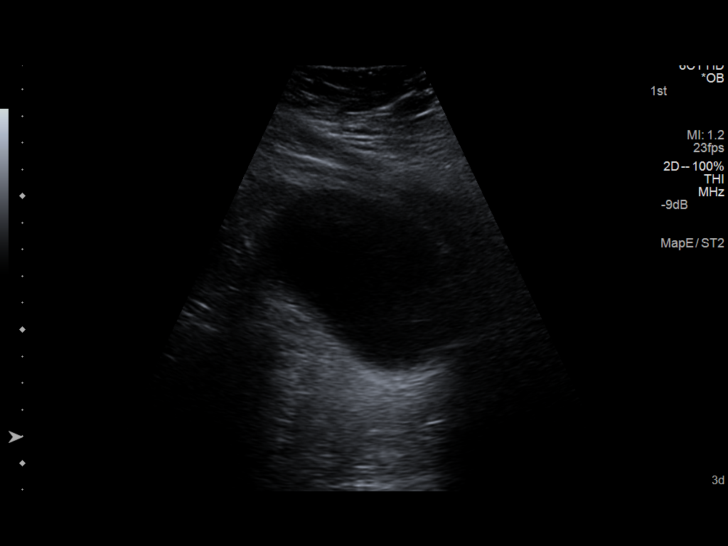
[im 11/58]
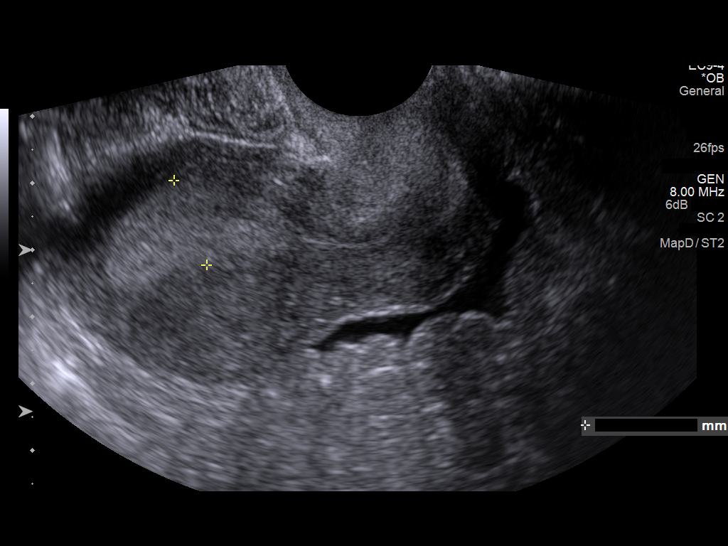
[im 15/58]
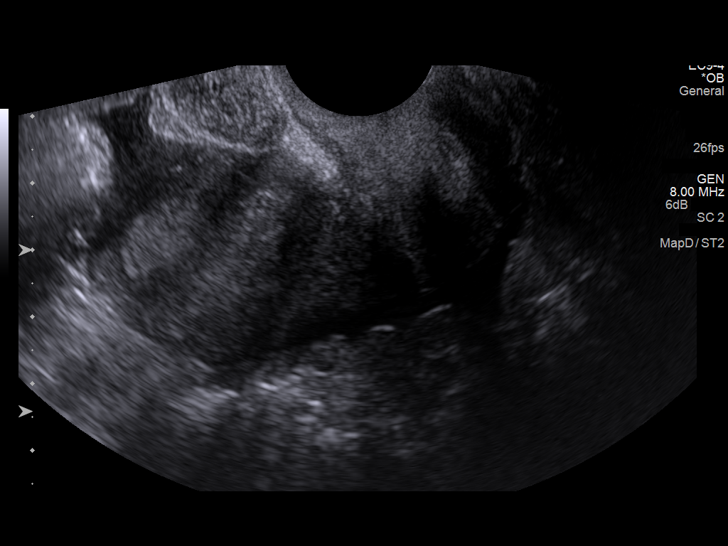
[im 20/58]
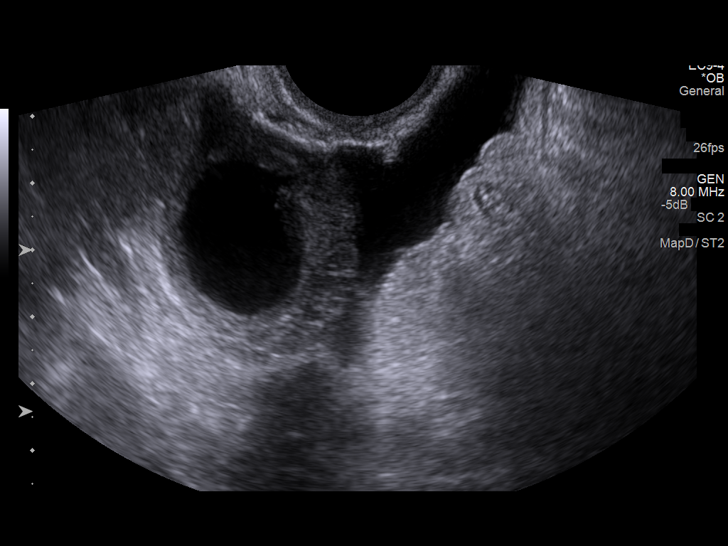
[im 24/58]
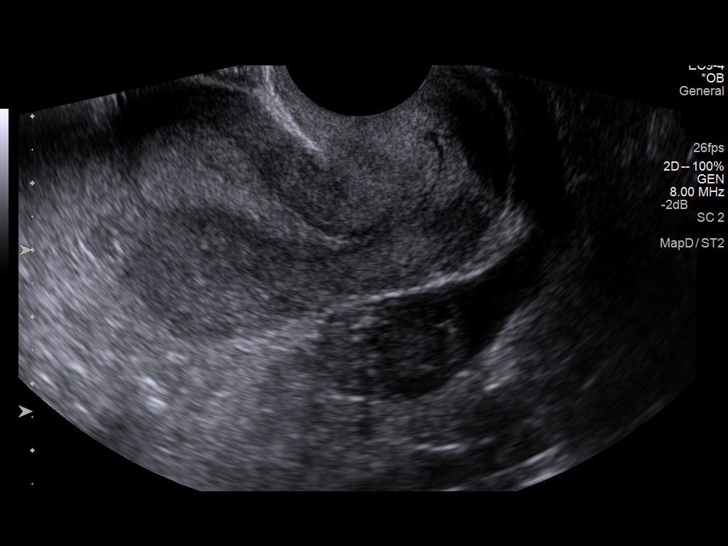
[im 28/58]
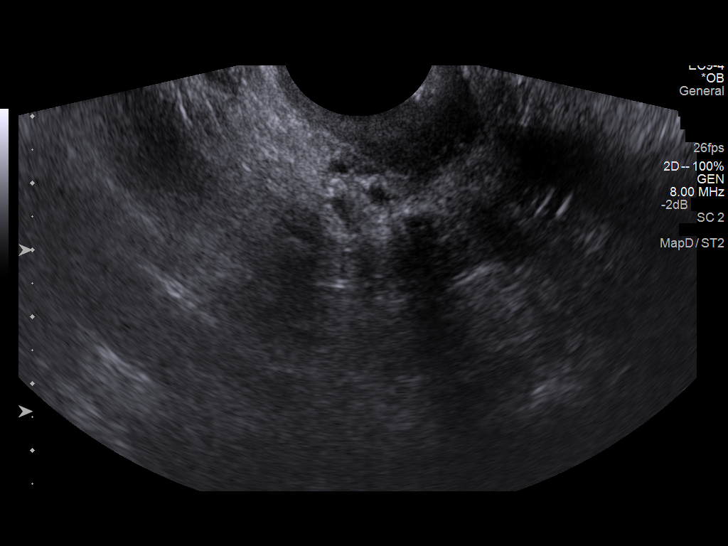
[im 32/58]
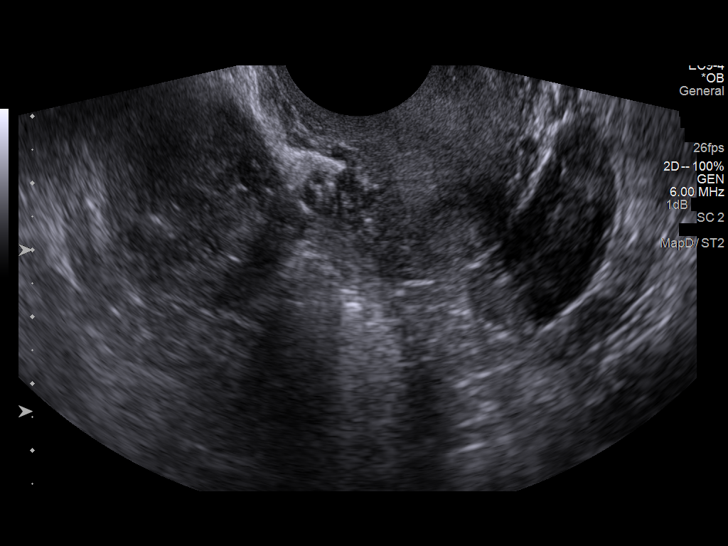
[im 36/58]
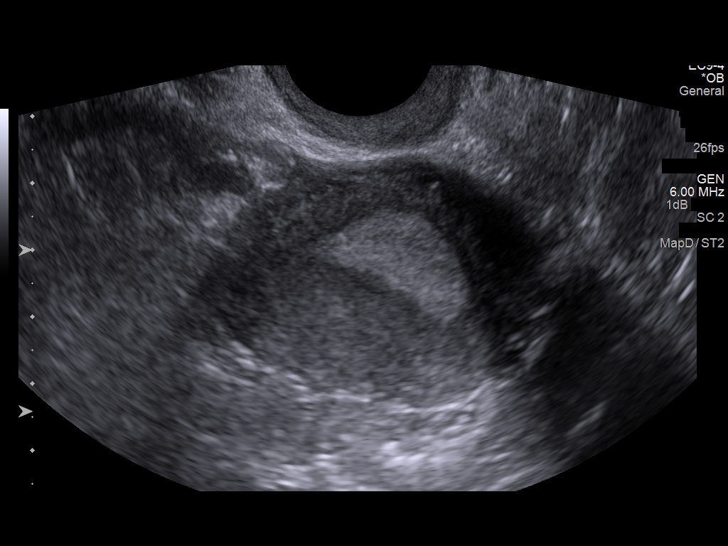
[im 41/58]
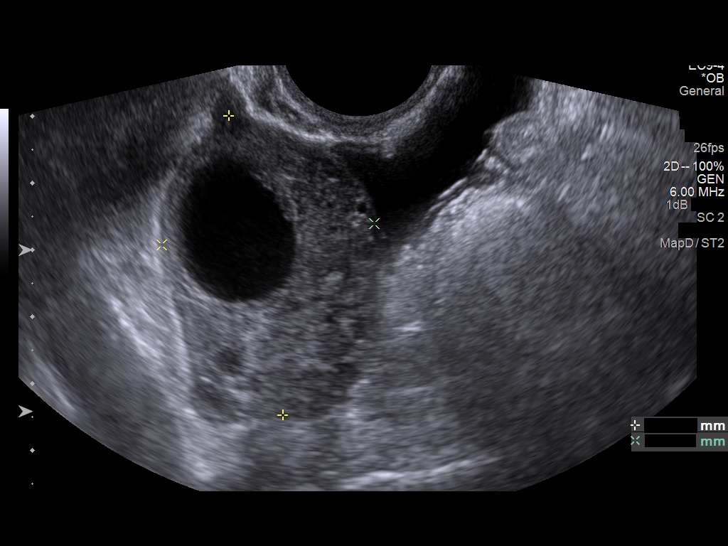
[im 45/58]
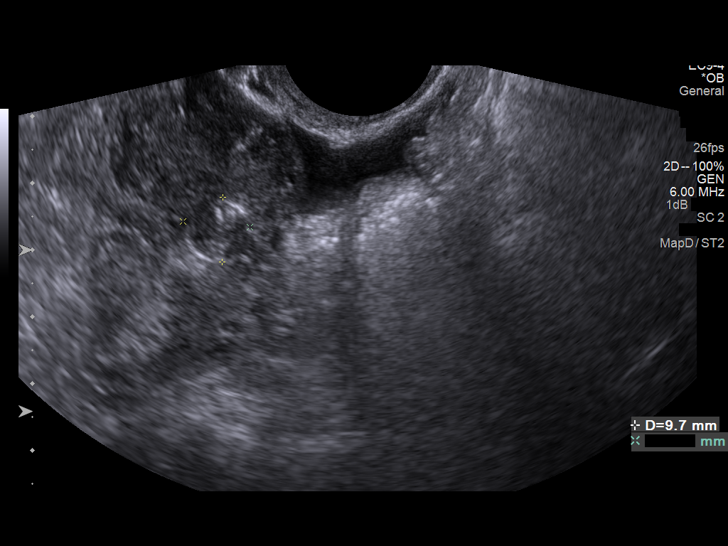
[im 49/58]
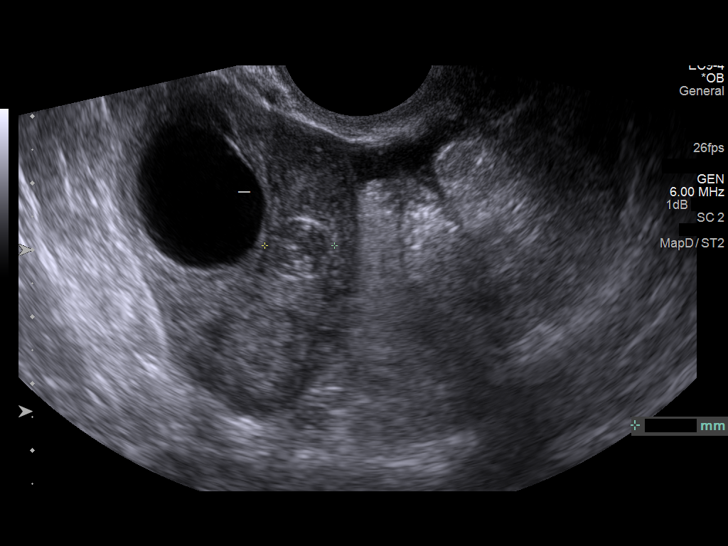
[im 53/58]
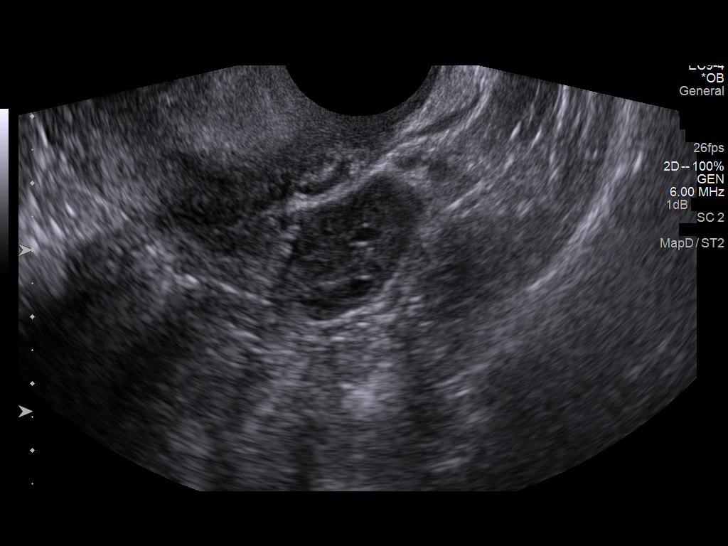
[im 58/58]
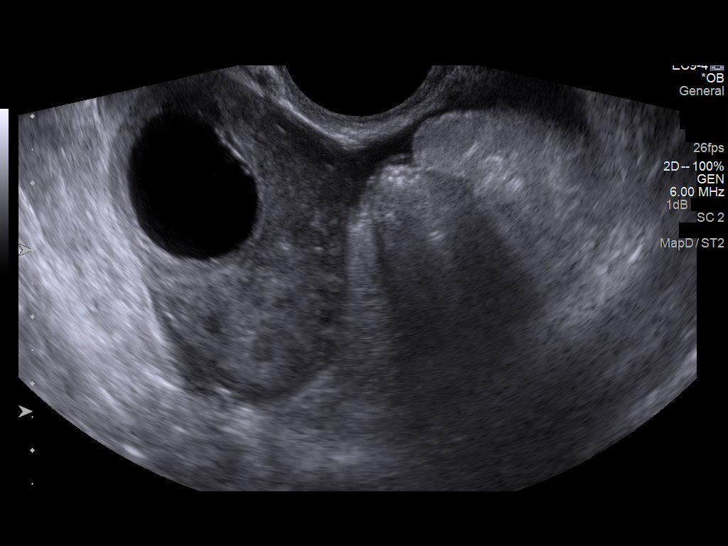

[14 of 28 positions shown; findings below may reference images not displayed]

FINDINGS: Intrauterine gestational sac: Visualized/normal in shape.

Yolk sac:  Not visualized.

Embryo:  Not visualized.

Cardiac Activity: Not visualized.

Maternal uterus/adnexae: Left ovary appears normal. 2.5 cm cyst is
noted in right ovary which may represent corpus luteum cyst. Small
amount of free fluid is noted in the pelvis.
IMPRESSION: No intrauterine fluid collection or fetal pole is noted. In a
pregnant patient, differential includes early intrauterine
pregnancy, missed abortion or nonvisualized ectopic pregnancy.
Correlation with beta HCG levels is recommended as well as follow-up
ultrasound in 7-10 days.

## 2015-02-11 MED ORDER — METRONIDAZOLE 500 MG PO TABS: 500.0000 mg | ORAL_TABLET | Freq: Two times a day (BID) | ORAL | Status: DC

## 2015-02-11 NOTE — ED Notes (Signed)
Pt sts lower abd pain; pt denies discharge, bleeding or urinary sx; pt sts LMP was 12/28/14

## 2015-02-11 NOTE — ED Provider Notes (Signed)
CSN: [id number]     Arrival date & time 02/11/15  1021 History   First MD Initiated Contact with Patient 02/11/15 1207     Chief Complaint  Patient presents with  . Abdominal Pain     (Consider location/radiation/quality/duration/timing/severity/associated sxs/prior Treatment) HPI Comments: Patient here with left lower quadrant pain 1 week. Pain is been constant and sharp and without radiation. Denies any vaginal bleeding or discharge. Denies any hematuria or dysuria. Pain is persistent and worse with movement and better with rest. History of PID in the past and is concerned she may have that again. Use over-the-counter medications without relief. Denies any fever, vomiting, diarrhea  Patient is a 24 y.o. female presenting with abdominal pain. The history is provided by the patient.  Abdominal Pain   Past Medical History  Diagnosis Date  . Mental disorder   . Depression   . Sleep apnea   . PID (pelvic inflammatory disease)   . Gonorrhea   . Chlamydia    Past Surgical History  Procedure Laterality Date  . Knee surgery     History reviewed. No pertinent family history. History  Substance Use Topics  . Smoking status: Never Smoker   . Smokeless tobacco: Not on file  . Alcohol Use: No   OB History    Gravida Para Term Preterm AB TAB SAB Ectopic Multiple Living   0              Review of Systems  Gastrointestinal: Positive for abdominal pain.  All other systems reviewed and are negative.     Allergies  Review of patient's allergies indicates no known allergies.  Home Medications   Prior to Admission medications   Medication Sig Start Date End Date Taking? Authorizing Provider  docusate sodium (COLACE) 100 MG capsule Take 1 capsule (100 mg total) by mouth 2 (two) times daily as needed. Patient taking differently: Take 100 mg by mouth 2 (two) times daily as needed for mild constipation.  07/22/14   Tereso NewcomerUgonna A Anyanwu, MD  DULoxetine (CYMBALTA) 60 MG capsule Take 60 mg  by mouth daily.    Historical Provider, MD  ketorolac (TORADOL) 10 MG tablet Take 1 tablet (10 mg total) by mouth every 6 (six) hours as needed. Patient taking differently: Take 10 mg by mouth every 6 (six) hours as needed for moderate pain.  08/05/14   Bertram DenverKaren E Teague Clark, PA-C  naproxen (NAPROSYN) 500 MG tablet Take 1 tablet (500 mg total) by mouth 2 (two) times daily. 11/10/14   Linwood DibblesJon Knapp, MD  ondansetron (ZOFRAN) 4 MG tablet Take 1 tablet (4 mg total) by mouth every 6 (six) hours as needed for nausea. 07/22/14   Tereso NewcomerUgonna A Anyanwu, MD  oxyCODONE-acetaminophen (PERCOCET/ROXICET) 5-325 MG per tablet Take 1-2 tablets by mouth every 3 (three) hours as needed for severe pain (moderate to severe pain (when tolerating fluids)). 07/22/14   Tereso NewcomerUgonna A Anyanwu, MD  promethazine (PHENERGAN) 25 MG tablet Take 1 tablet (25 mg total) by mouth every 6 (six) hours as needed for nausea or vomiting. 07/22/14   Tereso NewcomerUgonna A Anyanwu, MD  traZODone (DESYREL) 100 MG tablet Take 100 mg by mouth at bedtime as needed for sleep.     Historical Provider, MD   BP 135/73 mmHg  Pulse 90  Temp(Src) 98.3 F (36.8 C) (Oral)  Resp 18  SpO2 100% Physical Exam  Constitutional: She is oriented to person, place, and time. She appears well-developed and well-nourished.  Non-toxic appearance. No distress.  HENT:  Head: Normocephalic and atraumatic.  Eyes: Conjunctivae, EOM and lids are normal. Pupils are equal, round, and reactive to light.  Neck: Normal range of motion. Neck supple. No tracheal deviation present. No thyroid mass present.  Cardiovascular: Normal rate, regular rhythm and normal heart sounds.  Exam reveals no gallop.   No murmur heard. Pulmonary/Chest: Effort normal and breath sounds normal. No stridor. No respiratory distress. She has no decreased breath sounds. She has no wheezes. She has no rhonchi. She has no rales.  Abdominal: Soft. Normal appearance and bowel sounds are normal. She exhibits no distension. There is  tenderness in the left lower quadrant. There is no rigidity, no rebound, no guarding and no CVA tenderness.    Genitourinary: Vaginal discharge found.  Musculoskeletal: Normal range of motion. She exhibits no edema or tenderness.  Neurological: She is alert and oriented to person, place, and time. She has normal strength. No cranial nerve deficit or sensory deficit. GCS eye subscore is 4. GCS verbal subscore is 5. GCS motor subscore is 6.  Skin: Skin is warm and dry. No abrasion and no rash noted.  Psychiatric: She has a normal mood and affect. Her speech is normal and behavior is normal.  Nursing note and vitals reviewed.   ED Course  Procedures (including critical care time) Labs Review Labs Reviewed  URINALYSIS, ROUTINE W REFLEX MICROSCOPIC - Abnormal; Notable for the following:    APPearance HAZY (*)    All other components within normal limits  POC URINE PREG, ED - Abnormal; Notable for the following:    Preg Test, Ur POSITIVE (*)    All other components within normal limits  WET PREP, GENITAL  HCG, QUANTITATIVE, PREGNANCY  GC/CHLAMYDIA PROBE AMP (Early)    Imaging Review No results found.   EKG Interpretation None      MDM   Final diagnoses:  Abdominal pain    Patient will be treated for bacterial vaginosis. Spoke with OB on-call, Dr. Shawnie Pons, patient will follow-up at [facility] hospital and 40 hours for repeat beta hCG and ectopic precautions given. No evidence of peritoneal signs at this time on her abdominal exam.    Lorre Nick, MD 02/11/15 [phone]

## 2015-02-11 NOTE — Discharge Instructions (Signed)
Go to [facility] in 48 hours for a repeat serum pregnancy test. This is to make sure that you do not have an ectopic pregnancy which is a pregnancy in your tubes. Failure to go to women's hospital for follow-up could result in death. Go to maternity admissions and told them you are therefore a repeat lead pregnancy test Ectopic Pregnancy An ectopic pregnancy is when the fertilized egg attaches (implants) outside the uterus. Most ectopic pregnancies occur in the fallopian tube. Rarely do ectopic pregnancies occur on the ovary, intestine, pelvis, or cervix. In an ectopic pregnancy, the fertilized egg does not have the ability to develop into a normal, healthy baby.  A ruptured ectopic pregnancy is one in which the fallopian tube gets torn or bursts and results in internal bleeding. Often there is intense abdominal pain, and sometimes, vaginal bleeding. Having an ectopic pregnancy can be life threatening. If left untreated, this dangerous condition can lead to a blood transfusion, abdominal surgery, or even death. CAUSES  Damage to the fallopian tubes is the suspected cause in most ectopic pregnancies.  RISK FACTORS Depending on your circumstances, the risk of having an ectopic pregnancy will vary. The level of risk can be divided into three categories. High Risk  You have gone through infertility treatment.  You have had a previous ectopic pregnancy.  You have had previous tubal surgery.  You have had previous surgery to have the fallopian tubes tied (tubal ligation).  You have tubal problems or diseases.  You have been exposed to DES. DES is a medicine that was used until 1971 and had effects on babies whose mothers took the medicine.  You become pregnant while using an intrauterine device (IUD) for birth control. Moderate Risk  You have a history of infertility.  You have a history of a sexually transmitted infection (STI).  You have a history of pelvic inflammatory disease  (PID).  You have scarring from endometriosis.  You have multiple sexual partners.  You smoke. Low Risk  You have had previous pelvic surgery.  You use vaginal douching.  You became sexually active before 24 years of age. SIGNS AND SYMPTOMS  An ectopic pregnancy should be suspected in anyone who has missed a period and has abdominal pain or bleeding.  You may experience normal pregnancy symptoms, such as:  Nausea.  Tiredness.  Breast tenderness.  Other symptoms may include:  Pain with intercourse.  Irregular vaginal bleeding or spotting.  Cramping or pain on one side or in the lower abdomen.  Fast heartbeat.  Passing out while having a bowel movement.  Symptoms of a ruptured ectopic pregnancy and internal bleeding may include:  Sudden, severe pain in the abdomen and pelvis.  Dizziness or fainting.  Pain in the shoulder area. DIAGNOSIS  Tests that may be performed include:  A pregnancy test.  An ultrasound test.  Testing the specific level of pregnancy hormone in the bloodstream.  Taking a sample of uterus tissue (dilation and curettage, D&C).  Surgery to perform a visual exam of the inside of the abdomen using a thin, lighted tube with a tiny camera on the end (laparoscope). TREATMENT  An injection of a medicine called methotrexate may be given. This medicine causes the pregnancy tissue to be absorbed. It is given if:  The diagnosis is made early.  The fallopian tube has not ruptured.  You are considered to be a good candidate for the medicine. Usually, pregnancy hormone blood levels are checked after methotrexate treatment. This is to  be sure the medicine is effective. It may take 4-6 weeks for the pregnancy to be absorbed (though most pregnancies will be absorbed by 3 weeks). Surgical treatment may be needed. A laparoscope may be used to remove the pregnancy tissue. If severe internal bleeding occurs, a cut (incision) may be made in the lower abdomen  (laparotomy), and the ectopic pregnancy is removed. This stops the bleeding. Part of the fallopian tube, or the whole tube, may be removed as well (salpingectomy). After surgery, pregnancy hormone tests may be done to be sure there is no pregnancy tissue left. You may receive a Rho (D) immune globulin shot if you are Rh negative and the father is Rh positive, or if you do not know the Rh type of the father. This is to prevent problems with any future pregnancy. SEEK IMMEDIATE MEDICAL CARE IF:  You have any symptoms of an ectopic pregnancy. This is a medical emergency. MAKE SURE YOU:  Understand these instructions.  Will watch your condition.  Will get help right away if you are not doing well or get worse. Document Released: 12/21/2004 Document Revised: 03/30/2014 Document Reviewed: 06/12/2013 [facility] Patient Information 2015 [employer] [place]. This information is not intended to replace advice given to you by your health care provider. Make sure you discuss any questions you have with your health care provider. Bacterial Vaginosis Bacterial vaginosis is a vaginal infection that occurs when the normal balance of bacteria in the vagina is disrupted. It results from an overgrowth of certain bacteria. This is the most common vaginal infection in women of childbearing age. Treatment is important to prevent complications, especially in pregnant women, as it can cause a premature delivery. CAUSES  Bacterial vaginosis is caused by an increase in harmful bacteria that are normally present in smaller amounts in the vagina. Several different kinds of bacteria can cause bacterial vaginosis. However, the reason that the condition develops is not fully understood. RISK FACTORS Certain activities or behaviors can put you at an increased risk of developing bacterial vaginosis, including:  Having a new sex partner or multiple sex partners.  Douching.  Using an intrauterine device (IUD) for  contraception. Women do not get bacterial vaginosis from toilet seats, bedding, swimming pools, or contact with objects around them. SIGNS AND SYMPTOMS  Some women with bacterial vaginosis have no signs or symptoms. Common symptoms include:  Grey vaginal discharge.  A fishlike odor with discharge, especially after sexual intercourse.  Itching or burning of the vagina and vulva.  Burning or pain with urination. DIAGNOSIS  Your health care provider will take a medical history and examine the vagina for signs of bacterial vaginosis. A sample of vaginal fluid may be taken. Your health care provider will look at this sample under a microscope to check for bacteria and abnormal cells. A vaginal pH test may also be done.  TREATMENT  Bacterial vaginosis may be treated with antibiotic medicines. These may be given in the form of a pill or a vaginal cream. A second round of antibiotics may be prescribed if the condition comes back after treatment.  HOME CARE INSTRUCTIONS   Only take over-the-counter or prescription medicines as directed by your health care provider.  If antibiotic medicine was prescribed, take it as directed. Make sure you finish it even if you start to feel better.  Do not have sex until treatment is completed.  Tell all sexual partners that you have a vaginal infection. They should see their health care provider and be treated  if they have problems, such as a mild rash or itching.  Practice safe sex by using condoms and only having one sex partner. SEEK MEDICAL CARE IF:   Your symptoms are not improving after 3 days of treatment.  You have increased discharge or pain.  You have a fever. MAKE SURE YOU:   Understand these instructions.  Will watch your condition.  Will get help right away if you are not doing well or get worse. FOR MORE INFORMATION  Centers for Disease Control and Prevention, Division of STD Prevention: [employer] American Sexual Health  Association (ASHA): www.ashastd.org  Document Released: 11/13/2005 Document Revised: 09/03/2013 Document Reviewed: 06/25/2013 [facility] Patient Information 2015 [place], [place]. This information is not intended to replace advice given to you by your health care provider. Make sure you discuss any questions you have with your health care provider.

## 2015-02-11 NOTE — ED Notes (Signed)
Pt currently in US.

## 2015-02-12 ENCOUNTER — Encounter (HOSPITAL_COMMUNITY): Payer: Self-pay | Admitting: *Deleted

## 2015-02-12 ENCOUNTER — Encounter: Payer: Self-pay | Admitting: Obstetrics and Gynecology

## 2015-02-12 ENCOUNTER — Inpatient Hospital Stay (HOSPITAL_COMMUNITY)
Admission: AD | Admit: 2015-02-12 | Discharge: 2015-02-12 | Disposition: A | Discharge status: Home / Self Care | Payer: Medicaid Other | Source: Ambulatory Visit | Attending: Obstetrics and Gynecology | Admitting: Obstetrics and Gynecology

## 2015-02-12 DIAGNOSIS — O210 Mild hyperemesis gravidarum: Secondary | ICD-10-CM | POA: Diagnosis not present

## 2015-02-12 DIAGNOSIS — Z3A01 Less than 8 weeks gestation of pregnancy: Secondary | ICD-10-CM | POA: Diagnosis not present

## 2015-02-12 DIAGNOSIS — O219 Vomiting of pregnancy, unspecified: Secondary | ICD-10-CM | POA: Diagnosis not present

## 2015-02-12 DIAGNOSIS — R109 Unspecified abdominal pain: Secondary | ICD-10-CM | POA: Diagnosis present

## 2015-02-12 DIAGNOSIS — O0281 Inappropriate change in quantitative human chorionic gonadotropin (hCG) in early pregnancy: Secondary | ICD-10-CM | POA: Diagnosis not present

## 2015-02-12 LAB — URINALYSIS, ROUTINE W REFLEX MICROSCOPIC
Bilirubin Urine: NEGATIVE
Glucose, UA: NEGATIVE mg/dL
Hgb urine dipstick: NEGATIVE
Ketones, ur: NEGATIVE mg/dL
Leukocytes, UA: NEGATIVE
Nitrite: NEGATIVE
Protein, ur: NEGATIVE mg/dL
Specific Gravity, Urine: 1.025 (ref 1.005–1.030)
Urobilinogen, UA: 1 mg/dL (ref 0.0–1.0)
pH: 6 (ref 5.0–8.0)

## 2015-02-12 LAB — GC/CHLAMYDIA PROBE AMP (~~LOC~~) NOT AT ARMC
Chlamydia: NEGATIVE
Neisseria Gonorrhea: NEGATIVE

## 2015-02-12 MED ORDER — PRENATAL VITAMINS 0.8 MG PO TABS: 1.0000 | ORAL_TABLET | Freq: Every day | ORAL | Status: DC

## 2015-02-12 MED ORDER — PROMETHAZINE HCL 25 MG PO TABS
25.0000 mg | ORAL_TABLET | Freq: Once | ORAL | Status: AC
  Administered 2015-02-12: 25 mg via ORAL
  Filled 2015-02-12: qty 1

## 2015-02-12 MED ORDER — PROMETHAZINE HCL 25 MG PO TABS: 25.0000 mg | ORAL_TABLET | Freq: Four times a day (QID) | ORAL | Status: DC | PRN

## 2015-02-12 MED ORDER — PROMETHAZINE HCL 25 MG PO TABS: 25.0000 mg | ORAL_TABLET | Freq: Once | ORAL | Status: DC

## 2015-02-12 NOTE — MAU Note (Signed)
[facility] for nausea and abd pain. Found out was pregnant. LMP 12/28/14. Was told to come here if nausea returned. Can't keep anything down. Vomited 2x today. No diarrhea. Mild cramping

## 2015-02-12 NOTE — Discharge Instructions (Signed)

## 2015-02-12 NOTE — MAU Provider Note (Signed)
History     CSN: [id number]  Arrival date and time: 02/12/15 1945   First Provider Initiated Contact with Patient 02/12/15 2034      Chief Complaint  Patient presents with  . Morning Sickness  . Abdominal Cramping   HPI  24 y.o. G1P0.@[redacted]w[redacted]d  presents to the MAU for nausea in pregnancy. She has vomited twice today. She was seen in the ER yesterday and found out she was pregnant and did not get a RX for nausea.   Past Medical History  Diagnosis Date  . Mental disorder   . Depression   . Sleep apnea   . PID (pelvic inflammatory disease)   . Gonorrhea   . Chlamydia     Past Surgical History  Procedure Laterality Date  . Knee surgery      No family history on file.  History  Substance Use Topics  . Smoking status: Never Smoker   . Smokeless tobacco: Not on file  . Alcohol Use: No    Allergies: No Known Allergies  Prescriptions prior to admission  Medication Sig Dispense Refill Last Dose  . docusate sodium (COLACE) 100 MG capsule Take 1 capsule (100 mg total) by mouth 2 (two) times daily as needed. (Patient taking differently: Take 100 mg by mouth 2 (two) times daily as needed for mild constipation. ) 30 capsule 2 02/11/2015 at Unknown time  . DULoxetine (CYMBALTA) 60 MG capsule Take 60 mg by mouth daily.   Past Week at Unknown time  . metroNIDAZOLE (FLAGYL) 500 MG tablet Take 1 tablet (500 mg total) by mouth 2 (two) times daily. 14 tablet 0 02/12/2015 at Unknown time  . ketorolac (TORADOL) 10 MG tablet Take 1 tablet (10 mg total) by mouth every 6 (six) hours as needed. (Patient not taking: Reported on 02/12/2015) 10 tablet 0 more than one month  . naproxen (NAPROSYN) 500 MG tablet Take 1 tablet (500 mg total) by mouth 2 (two) times daily. (Patient not taking: Reported on 02/12/2015) 30 tablet 0   . ondansetron (ZOFRAN) 4 MG tablet Take 1 tablet (4 mg total) by mouth every 6 (six) hours as needed for nausea. (Patient not taking: Reported on 02/12/2015) 20 tablet 2 more than  one month  . oxyCODONE-acetaminophen (PERCOCET/ROXICET) 5-325 MG per tablet Take 1-2 tablets by mouth every 3 (three) hours as needed for severe pain (moderate to severe pain (when tolerating fluids)). (Patient not taking: Reported on 02/12/2015) 30 tablet 0 more than one month  . promethazine (PHENERGAN) 25 MG tablet Take 1 tablet (25 mg total) by mouth every 6 (six) hours as needed for nausea or vomiting. (Patient not taking: Reported on 02/12/2015) 30 tablet 2 more than one month  . traZODone (DESYREL) 100 MG tablet Take 100 mg by mouth at bedtime as needed for sleep.    more than one month    Review of Systems  Constitutional: Negative.   HENT: Negative.   Eyes: Negative.   Respiratory: Negative.   Cardiovascular: Negative.   Gastrointestinal: Positive for nausea and vomiting.  Genitourinary: Negative.   Musculoskeletal: Negative.   Skin: Negative.   Neurological: Negative.   Endo/Heme/Allergies: Negative.   Psychiatric/Behavioral: Negative.    Physical Exam   Blood pressure 143/80, pulse 106, temperature 98.9 F (37.2 C), resp. rate 20, last menstrual period 12/28/2014, SpO2 100 %.  Physical Exam  Nursing note and vitals reviewed. Constitutional: She is oriented to person, place, and time. She appears well-developed and well-nourished. No distress.  HENT:  Head: Normocephalic  and atraumatic.  Neck: Normal range of motion. Neck supple.  Cardiovascular: Normal rate and regular rhythm.   Respiratory: Effort normal. No respiratory distress.  GI: Soft.  Musculoskeletal: Normal range of motion.  Neurological: She is alert and oriented to person, place, and time.  Skin: Skin is warm and dry.  Psychiatric: She has a normal mood and affect. Her behavior is normal. Judgment and thought content normal.    MAU Course  Procedures  Phenergan 25 mg po  Assessment and Plan  Nausea and Vomiting of Pregnancy  Discharge to home Phenergan  PO RX  Tryone Kille  Grissett 02/12/2015, 8:39 PM

## 2015-02-13 ENCOUNTER — Inpatient Hospital Stay (HOSPITAL_COMMUNITY)
Admission: AD | Admit: 2015-02-13 | Discharge: 2015-02-13 | Disposition: A | Discharge status: Home / Self Care | Payer: Medicaid Other | Source: Ambulatory Visit | Attending: Obstetrics & Gynecology | Admitting: Obstetrics & Gynecology

## 2015-02-13 ENCOUNTER — Encounter: Payer: Self-pay | Admitting: Obstetrics and Gynecology

## 2015-02-13 DIAGNOSIS — O9989 Other specified diseases and conditions complicating pregnancy, childbirth and the puerperium: Secondary | ICD-10-CM | POA: Diagnosis present

## 2015-02-13 DIAGNOSIS — O0281 Inappropriate change in quantitative human chorionic gonadotropin (hCG) in early pregnancy: Secondary | ICD-10-CM | POA: Diagnosis not present

## 2015-02-13 DIAGNOSIS — Z3A01 Less than 8 weeks gestation of pregnancy: Secondary | ICD-10-CM | POA: Insufficient documentation

## 2015-02-13 LAB — HCG, QUANTITATIVE, PREGNANCY: hCG, Beta Chain, Quant, S: 829 m[IU]/mL — ABNORMAL HIGH (ref <5)

## 2015-02-13 NOTE — MAU Provider Note (Signed)
Subjective:  Ms [name] is a 24 y.o. female G1P0 at 3472w5d who presents for a follow up beta hcg level. She was originally seen 2 days ago with abdominal pain. Currently she denies pain or bleeding     Objective:  GENERAL: Well-developed, well-nourished female in no acute distress.  HEENT: Normocephalic, atraumatic.   LUNGS: Effort normal HEART: Regular rate  SKIN: Warm, dry and without erythema PSYCH: Normal mood and affect  Filed Vitals:   02/13/15 1332  BP: 146/84  Pulse: 94  Temp: 98.2 F (36.8 C)   Results for orders placed or performed during the hospital encounter of 02/13/15 (from the past 48 hour(s))  hCG, quantitative, pregnancy     Status: Abnormal   Collection Time: 02/13/15  1:35 PM  Result Value Ref Range   hCG, Beta Chain, Quant, S 829 (H) <5 mIU/mL    Comment:          GEST. AGE      CONC.  (mIU/mL)   <=1 WEEK        5 - 50     2 WEEKS       50 - 500     3 WEEKS       100 - 10,000     4 WEEKS     1,000 - 30,000     5 WEEKS     3,500 - 115,000   6-8 WEEKS     12,000 - 270,000    12 WEEKS     15,000 - 220,000        FEMALE AND NON-PREGNANT FEMALE:     LESS THAN 5 mIU/mL     MDM:  Beta hcg 3/17: 292 Beta hcg 3/19: 829   Assessment:  1. Elevated level of quantitative hCG for gestational age in early pregnancy   2.      Appropriate rise in beta hcg level.    Plan:  Discharge home in stable condition US in 7-10 days. [place] to call and scheduled Return to MAU if symptoms worsen Pelvic rest Ectopic precautions.    Duane LopeJennifer I Rasch, NP 02/13/2015 2:53 PM

## 2015-02-13 NOTE — MAU Note (Signed)
Pt presents to MAU for repeat  BHCG. Denies any vaginal bleeding or pain 

## 2015-02-15 ENCOUNTER — Encounter: Payer: Self-pay | Admitting: Obstetrics and Gynecology

## 2015-02-17 ENCOUNTER — Encounter: Payer: Self-pay | Admitting: Obstetrics and Gynecology

## 2015-02-23 ENCOUNTER — Encounter: Payer: Self-pay | Admitting: Obstetrics and Gynecology

## 2015-02-23 ENCOUNTER — Encounter (HOSPITAL_COMMUNITY): Payer: Self-pay | Admitting: *Deleted

## 2015-02-23 ENCOUNTER — Inpatient Hospital Stay (HOSPITAL_COMMUNITY)
Admission: AD | Admit: 2015-02-23 | Discharge: 2015-02-23 | Disposition: A | Discharge status: Home / Self Care | Payer: Medicaid Other | Source: Ambulatory Visit | Attending: Family Medicine | Admitting: Family Medicine

## 2015-02-23 ENCOUNTER — Ambulatory Visit (HOSPITAL_COMMUNITY)
Admission: RE | Admit: 2015-02-23 | Discharge: 2015-02-23 | Disposition: A | Discharge status: Home / Self Care | Payer: Medicaid Other | Source: Ambulatory Visit | Attending: Obstetrics and Gynecology | Admitting: Obstetrics and Gynecology

## 2015-02-23 DIAGNOSIS — O3680X0 Pregnancy with inconclusive fetal viability, not applicable or unspecified: Secondary | ICD-10-CM

## 2015-02-23 DIAGNOSIS — O0281 Inappropriate change in quantitative human chorionic gonadotropin (hCG) in early pregnancy: Secondary | ICD-10-CM | POA: Diagnosis present

## 2015-02-23 DIAGNOSIS — O9989 Other specified diseases and conditions complicating pregnancy, childbirth and the puerperium: Secondary | ICD-10-CM | POA: Diagnosis present

## 2015-02-23 DIAGNOSIS — Z3A01 Less than 8 weeks gestation of pregnancy: Secondary | ICD-10-CM | POA: Insufficient documentation

## 2015-02-23 IMAGING — US US OB TRANSVAGINAL
1 series · 14 of 25 positions shown · non-contrast
Comparison: Pelvic ultrasound [DATE]

CLINICAL DATA: Patient with increased beta HCG.

EXAM:
TRANSVAGINAL OB ULTRASOUND
TECHNIQUE: Transvaginal ultrasound was performed for complete evaluation of the
gestation as well as the maternal uterus, adnexal regions, and
pelvic cul-de-sac.

[Series 1: us ob transvaginal · 14 of 25 slices shown]
[im 1/25]
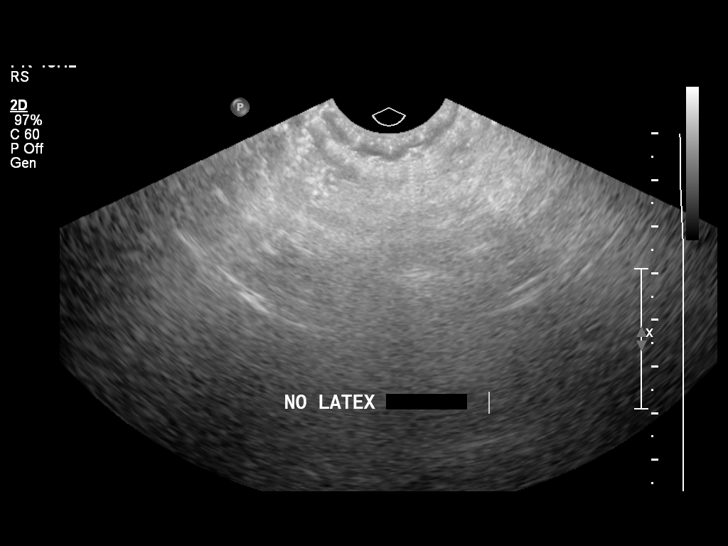
[im 3/25]
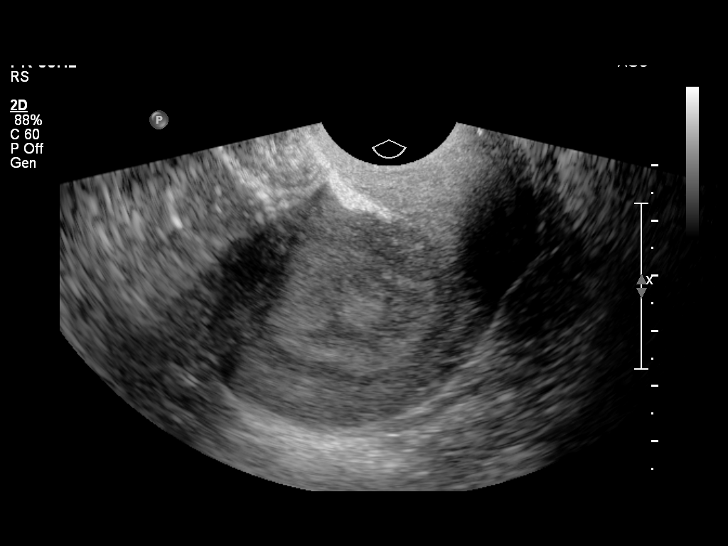
[im 5/25]
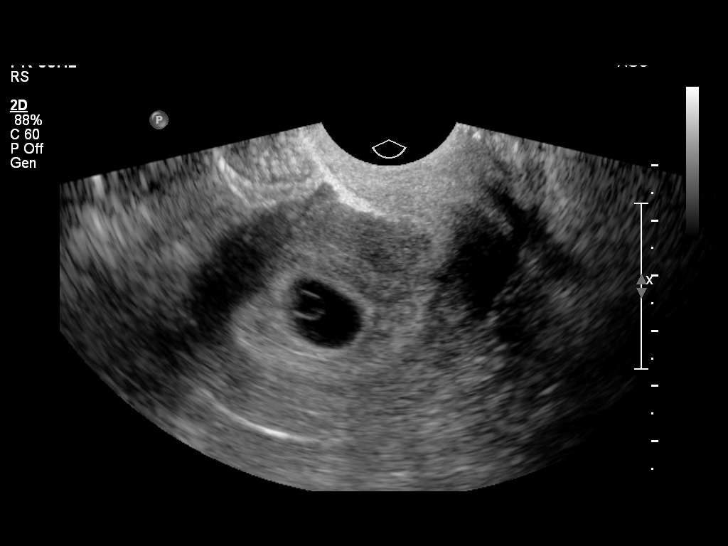
[im 7/25]
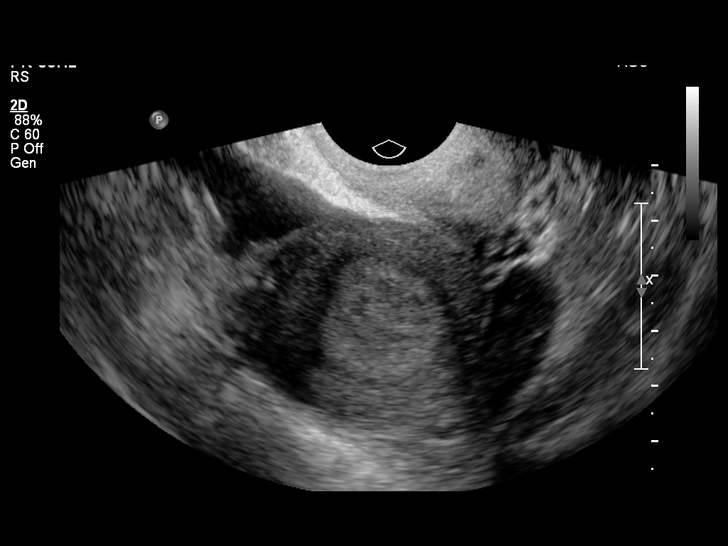
[im 9/25]
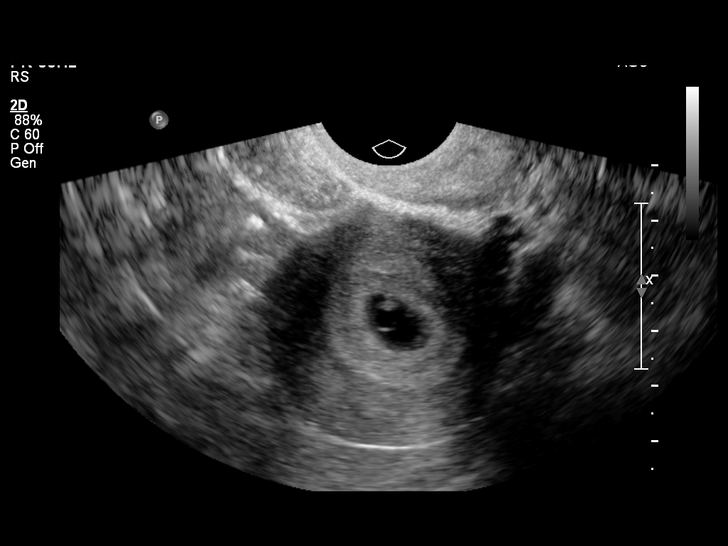
[im 10/25]
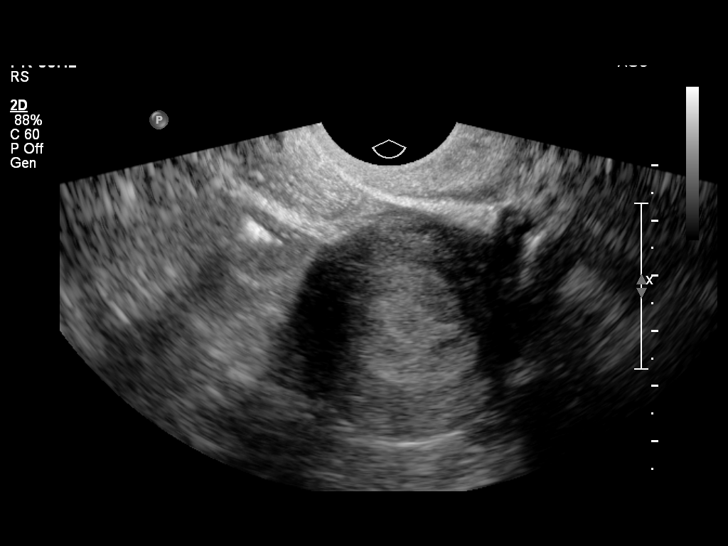
[im 12/25]
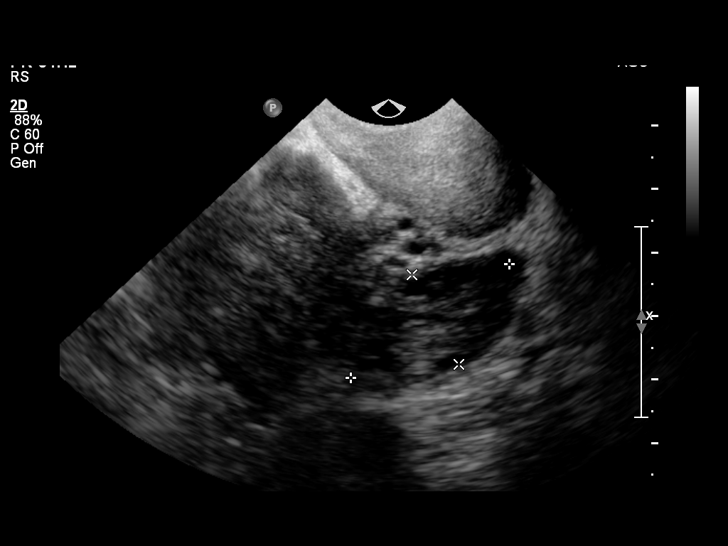
[im 14/25]
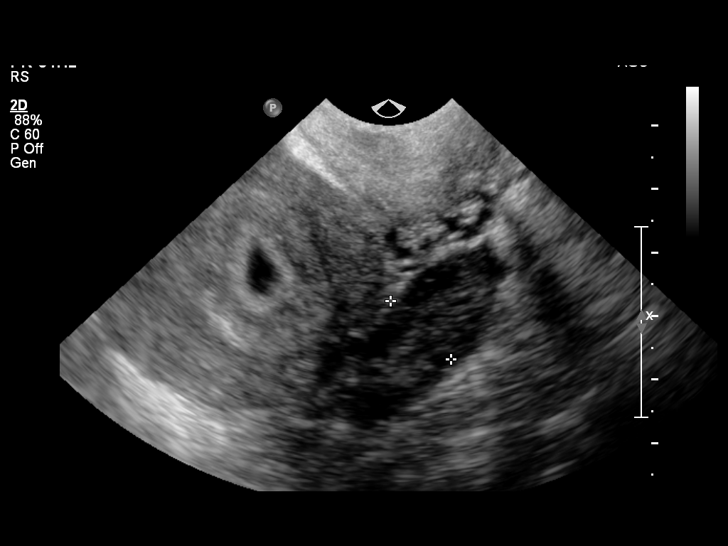
[im 16/25]
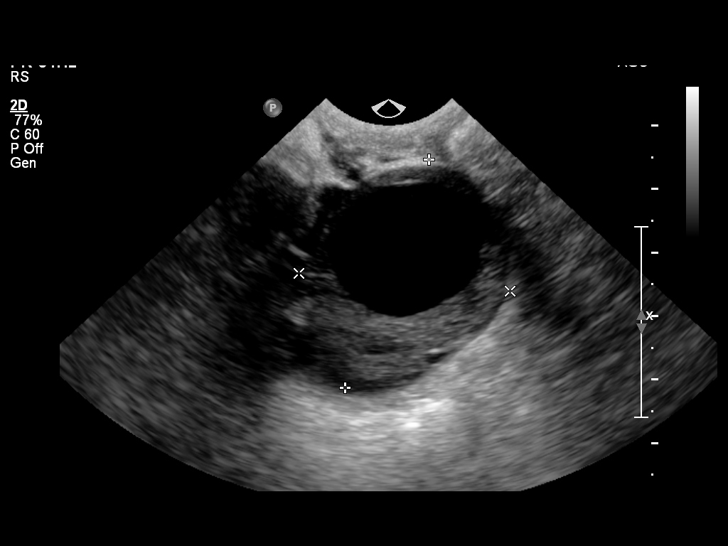
[im 17/25]
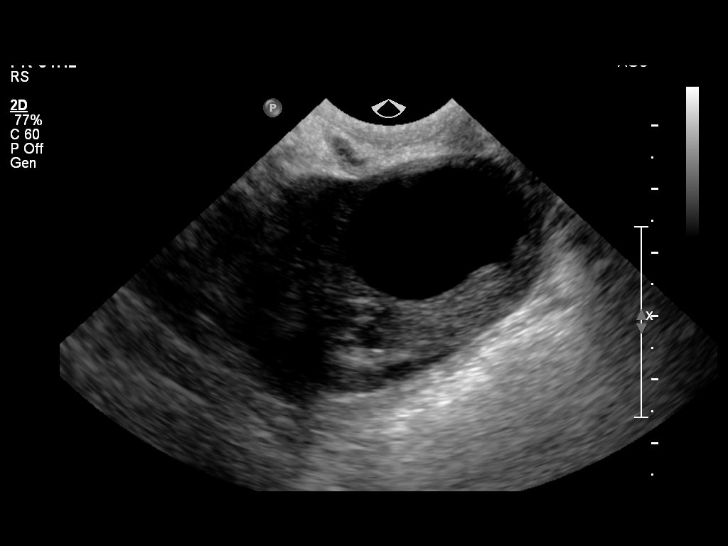
[im 19/25]
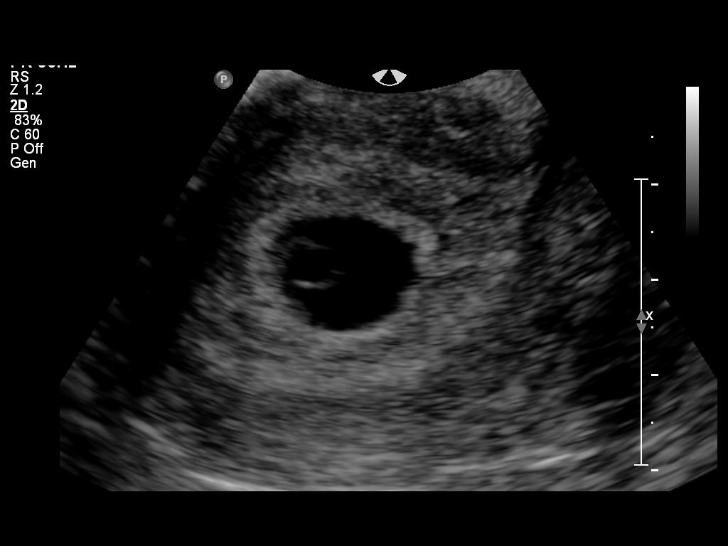
[im 21/25]
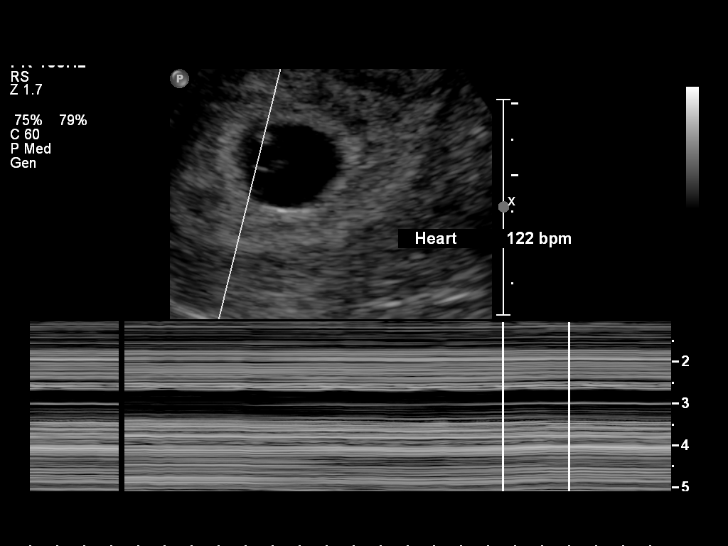
[im 23/25]
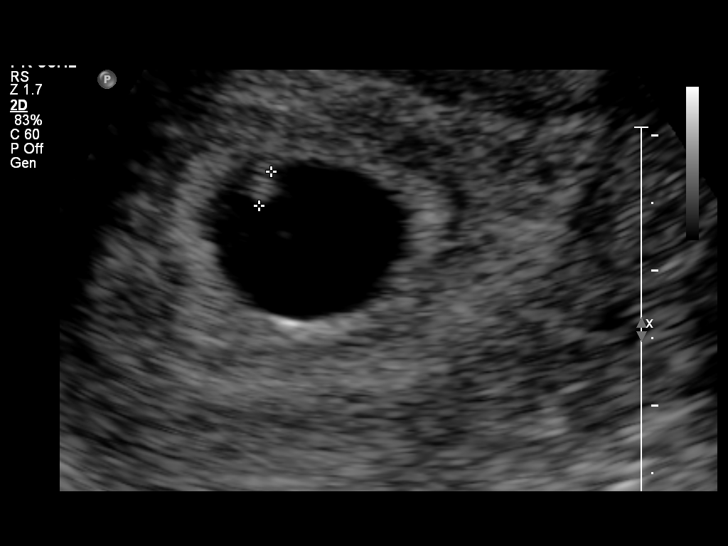
[im 25/25]
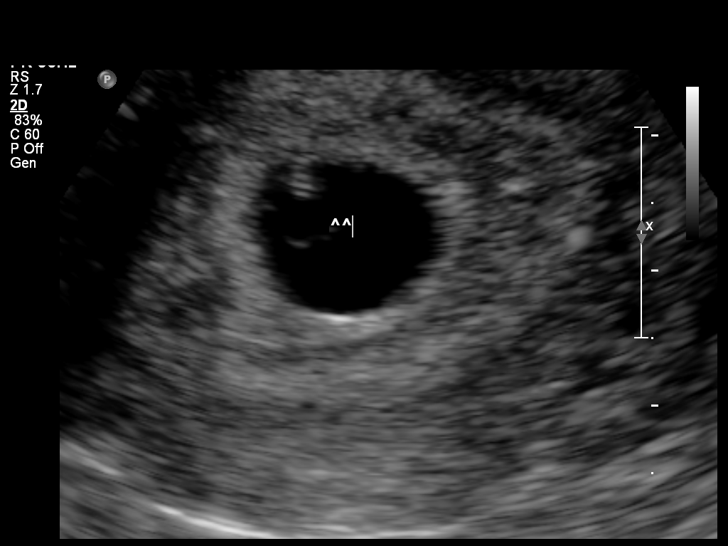

[14 of 25 positions shown; findings below may reference images not displayed]

FINDINGS: Intrauterine gestational sac: Visualized/normal in shape.

Yolk sac:  Present

Embryo:  Present

Cardiac Activity: Present

Heart Rate: 122 bpm

CRL:   2.6  mm   5 w 6 d                  US EDC: [DATE]

Maternal uterus/adnexae: Normal right and left ovaries. Right
ovarian cyst. No subchorionic hemorrhage. No free fluid in the
pelvis.
IMPRESSION: Single live intrauterine gestation 5 weeks 6 days by crown-rump
length.

## 2015-02-23 NOTE — MAU Provider Note (Signed)
Pt here for f/u ultrasound to confirm viability- pt denies any spotting, bleeding or cramping [name] ultrasound showed viable IUP 181w6d EDC 10/20/2015 FHR 122 bmp Pt to proceed with OB care\[facility] Picture given to pt Alexandra HoitSusan Seairra [facility]

## 2015-03-07 ENCOUNTER — Encounter: Payer: Self-pay | Admitting: Obstetrics and Gynecology

## 2015-03-08 ENCOUNTER — Encounter (HOSPITAL_COMMUNITY): Payer: Self-pay | Admitting: *Deleted

## 2015-03-08 ENCOUNTER — Inpatient Hospital Stay (HOSPITAL_COMMUNITY)
Admission: AD | Admit: 2015-03-08 | Discharge: 2015-03-08 | Disposition: A | Discharge status: Home / Self Care | Payer: Medicaid Other | Source: Ambulatory Visit | Attending: Obstetrics and Gynecology | Admitting: Obstetrics and Gynecology

## 2015-03-08 DIAGNOSIS — R197 Diarrhea, unspecified: Secondary | ICD-10-CM | POA: Diagnosis not present

## 2015-03-08 DIAGNOSIS — Z3A01 Less than 8 weeks gestation of pregnancy: Secondary | ICD-10-CM | POA: Diagnosis not present

## 2015-03-08 DIAGNOSIS — O210 Mild hyperemesis gravidarum: Secondary | ICD-10-CM | POA: Insufficient documentation

## 2015-03-08 DIAGNOSIS — R1013 Epigastric pain: Secondary | ICD-10-CM | POA: Insufficient documentation

## 2015-03-08 DIAGNOSIS — O219 Vomiting of pregnancy, unspecified: Secondary | ICD-10-CM

## 2015-03-08 LAB — URINALYSIS, ROUTINE W REFLEX MICROSCOPIC
Bilirubin Urine: NEGATIVE
Glucose, UA: NEGATIVE mg/dL
HGB URINE DIPSTICK: NEGATIVE
Ketones, ur: NEGATIVE mg/dL
Nitrite: NEGATIVE
Protein, ur: NEGATIVE mg/dL
Specific Gravity, Urine: >1.03 — ABNORMAL HIGH (ref 1.005–1.030)
Urobilinogen, UA: 1 mg/dL (ref 0.0–1.0)
pH: 6 (ref 5.0–8.0)

## 2015-03-08 LAB — COMPREHENSIVE METABOLIC PANEL
ALT: 51 U/L — ABNORMAL HIGH (ref 0–35)
AST: 36 U/L (ref 0–37)
Albumin: 3.8 g/dL (ref 3.5–5.2)
Alkaline Phosphatase: 76 U/L (ref 39–117)
Anion gap: 9 (ref 5–15)
BUN: 10 mg/dL (ref 6–23)
CALCIUM: 8.8 mg/dL (ref 8.4–10.5)
CO2: 25 mmol/L (ref 19–32)
Chloride: 103 mmol/L (ref 96–112)
Creatinine, Ser: 0.71 mg/dL (ref 0.50–1.10)
GLUCOSE: 90 mg/dL (ref 70–99)
Potassium: 3.9 mmol/L (ref 3.5–5.1)
SODIUM: 137 mmol/L (ref 135–145)
Total Bilirubin: 0.4 mg/dL (ref 0.3–1.2)
Total Protein: 7.5 g/dL (ref 6.0–8.3)

## 2015-03-08 LAB — URINE MICROSCOPIC-ADD ON

## 2015-03-08 LAB — CBC WITH DIFFERENTIAL/PLATELET
BASOS ABS: 0 10*3/uL (ref 0.0–0.1)
Basophils Relative: 0 % (ref 0–1)
Eosinophils Absolute: 0.2 10*3/uL (ref 0.0–0.7)
Eosinophils Relative: 3 % (ref 0–5)
HCT: 37.4 % (ref 36.0–46.0)
Hemoglobin: 12.5 g/dL (ref 12.0–15.0)
Lymphocytes Relative: 37 % (ref 12–46)
Lymphs Abs: 2.6 10*3/uL (ref 0.7–4.0)
MCH: 27.2 pg (ref 26.0–34.0)
MCHC: 33.4 g/dL (ref 30.0–36.0)
MCV: 81.3 fL (ref 78.0–100.0)
Monocytes Absolute: 0.6 10*3/uL (ref 0.1–1.0)
Monocytes Relative: 8 % (ref 3–12)
NEUTROS ABS: 3.8 10*3/uL (ref 1.7–7.7)
Neutrophils Relative %: 52 % (ref 43–77)
PLATELETS: 259 10*3/uL (ref 150–400)
RBC: 4.6 MIL/uL (ref 3.87–5.11)
RDW: 14.5 % (ref 11.5–15.5)
WBC: 7.1 10*3/uL (ref 4.0–10.5)

## 2015-03-08 MED ORDER — GI COCKTAIL ~~LOC~~
30.0000 mL | Freq: Once | ORAL | Status: AC
  Administered 2015-03-08: 30 mL via ORAL
  Filled 2015-03-08: qty 30

## 2015-03-08 MED ORDER — OMEPRAZOLE 20 MG PO CPDR: 20.0000 mg | DELAYED_RELEASE_CAPSULE | Freq: Every day | ORAL | Status: DC

## 2015-03-08 MED ORDER — ONDANSETRON 8 MG PO TBDP: 8.0000 mg | ORAL_TABLET | Freq: Three times a day (TID) | ORAL | Status: DC | PRN

## 2015-03-08 MED ORDER — ONDANSETRON 8 MG PO TBDP
8.0000 mg | ORAL_TABLET | Freq: Once | ORAL | Status: AC
  Administered 2015-03-08: 8 mg via ORAL
  Filled 2015-03-08: qty 1

## 2015-03-08 NOTE — MAU Note (Signed)
C/O vomiting and diarrhea since Friday. Pain in upper abdomen. Unable to keep food/liquids down. Says she is "burning up" but has not taken her temperature.

## 2015-03-08 NOTE — MAU Note (Signed)
Pt reports diarrhea x 4 days, watery stools x 3 today. Upper abd pain x 24 hours, worsening today.

## 2015-03-08 NOTE — MAU Note (Signed)
An After Visit Summary was printed and given to the patient. Patient verbalizes understanding.   

## 2015-03-08 NOTE — MAU Note (Signed)
Nausea resolved, pt says she feels better.

## 2015-03-08 NOTE — MAU Provider Note (Signed)
  History   G1 at 7.5 wks states has had nausea, vomiting, and epigastric pain since Friday. Unable to keep any food or liquids down.  CSN: [id number]  Arrival date and time: 03/08/15 [id number]   First Provider Initiated Contact with Patient 03/08/15 1930      No chief complaint on file.  HPI  OB History    Gravida Para Term Preterm AB TAB SAB Ectopic Multiple Living   1               Past Medical History  Diagnosis Date  . Mental disorder   . Depression   . Sleep apnea   . PID (pelvic inflammatory disease)   . Gonorrhea   . Chlamydia     Past Surgical History  Procedure Laterality Date  . Knee surgery      No family history on file.  History  Substance Use Topics  . Smoking status: Never Smoker   . Smokeless tobacco: Never Used  . Alcohol Use: No    Allergies: No Known Allergies  Prescriptions prior to admission  Medication Sig Dispense Refill Last Dose  . Prenatal Multivit-Min-Fe-FA (PRENATAL VITAMINS) 0.8 MG tablet Take 1 tablet by mouth daily. 30 tablet 12 03/08/2015 at Unknown time  . promethazine (PHENERGAN) 25 MG tablet Take 1 tablet (25 mg total) by mouth every 6 (six) hours as needed for nausea or vomiting. 30 tablet 1 03/06/2015    Review of Systems  Constitutional: Positive for malaise/fatigue.  HENT: Negative.   Eyes: Negative.   Cardiovascular: Negative.   Gastrointestinal: Positive for nausea, vomiting and diarrhea.       Epigastric pain  Genitourinary: Negative.   Musculoskeletal: Negative.   Skin: Negative.   Neurological: Negative.   Endo/Heme/Allergies: Negative.   Psychiatric/Behavioral: Negative.    Physical Exam   Blood pressure 123/77, pulse 86, temperature 98.2 F (36.8 C), temperature source Oral, resp. rate 18, height 5\' 1"  (1.549 m), weight 258 lb (117.028 kg), last menstrual period 12/28/2014, SpO2 100 %.  Physical Exam  Constitutional: She is oriented to person, place, and time. She appears well-developed and well-nourished.   HENT:  Head: Normocephalic.  Eyes: Pupils are equal, round, and reactive to light.  Neck: Normal range of motion.  Cardiovascular: Normal rate, regular rhythm, normal heart sounds and intact distal pulses.   Respiratory: Effort normal and breath sounds normal.  GI: Soft.  Bowel sounds hyperactive  Musculoskeletal: Normal range of motion.  Neurological: She is alert and oriented to person, place, and time. She has normal reflexes.  Skin: Skin is warm and dry.  Psychiatric: She has a normal mood and affect. Her behavior is normal. Judgment and thought content normal.    MAU Course  Procedures  MDM Nausea,vomiting diarrhea x 4 days  Assessment and Plan  CBC CMP, stool culture, gi cocktail, zofran x 1 dose. Feeling much better since GI cocktail and zofran. Wants to go home. Will d/c home  Wyvonnia DuskyLAWSON, Lacy Taglieri DARLENE 03/08/2015, 7:36 PM

## 2015-03-18 ENCOUNTER — Other Ambulatory Visit (HOSPITAL_COMMUNITY): Payer: Self-pay | Admitting: Nurse Practitioner

## 2015-03-18 DIAGNOSIS — Z3682 Encounter for antenatal screening for nuchal translucency: Secondary | ICD-10-CM

## 2015-03-18 LAB — OB RESULTS CONSOLE RUBELLA ANTIBODY, IGM: RUBELLA: IMMUNE

## 2015-03-18 LAB — OB RESULTS CONSOLE PLATELET COUNT: Platelets: 283 10*3/uL

## 2015-03-18 LAB — OB RESULTS CONSOLE RPR: RPR: NONREACTIVE

## 2015-03-18 LAB — OB RESULTS CONSOLE HGB/HCT, BLOOD
HEMATOCRIT: 42 %
Hemoglobin: 13.3 g/dL

## 2015-03-18 LAB — OB RESULTS CONSOLE VARICELLA ZOSTER ANTIBODY, IGG: VARICELLA IGG: IMMUNE

## 2015-03-18 LAB — OB RESULTS CONSOLE GC/CHLAMYDIA
Chlamydia: NEGATIVE
Gonorrhea: NEGATIVE

## 2015-03-18 LAB — OB RESULTS CONSOLE ABO/RH: RH TYPE: POSITIVE

## 2015-03-18 LAB — OB RESULTS CONSOLE HEPATITIS B SURFACE ANTIGEN: Hepatitis B Surface Ag: NEGATIVE

## 2015-03-18 LAB — OB RESULTS CONSOLE ANTIBODY SCREEN: Antibody Screen: NEGATIVE

## 2015-03-23 ENCOUNTER — Encounter: Payer: Self-pay | Admitting: Obstetrics and Gynecology

## 2015-04-03 ENCOUNTER — Encounter: Payer: Self-pay | Admitting: Obstetrics and Gynecology

## 2015-04-09 ENCOUNTER — Ambulatory Visit (HOSPITAL_COMMUNITY)
Admission: RE | Admit: 2015-04-09 | Discharge: 2015-04-09 | Disposition: A | Discharge status: Home / Self Care | Payer: Medicaid Other | Source: Ambulatory Visit | Attending: Nurse Practitioner | Admitting: Nurse Practitioner

## 2015-04-09 VITALS — BP 133/80 | HR 90 | Wt 254.8 lb

## 2015-04-09 DIAGNOSIS — Z36 Encounter for antenatal screening of mother: Secondary | ICD-10-CM | POA: Insufficient documentation

## 2015-04-09 DIAGNOSIS — Z3A12 12 weeks gestation of pregnancy: Secondary | ICD-10-CM | POA: Insufficient documentation

## 2015-04-09 DIAGNOSIS — Z3682 Encounter for antenatal screening for nuchal translucency: Secondary | ICD-10-CM | POA: Insufficient documentation

## 2015-04-09 DIAGNOSIS — O219 Vomiting of pregnancy, unspecified: Secondary | ICD-10-CM

## 2015-04-09 IMAGING — US US MFM FETAL NUCHAL TRANSLUCENCY
1 series · 13 of 28 positions shown · non-contrast
Comparison: none

[Series 1: us mfm fetal nuchal translucency · 0.22mm/px · 13 of 53 slices shown]
[im 2/53]
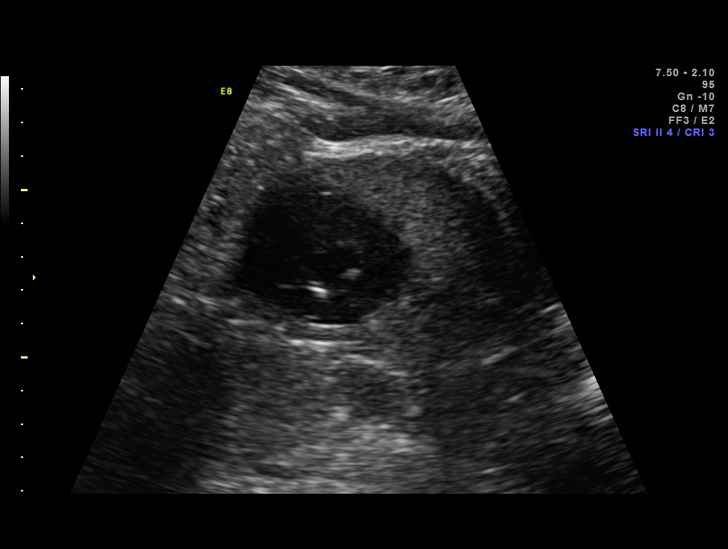
[im 6/53]
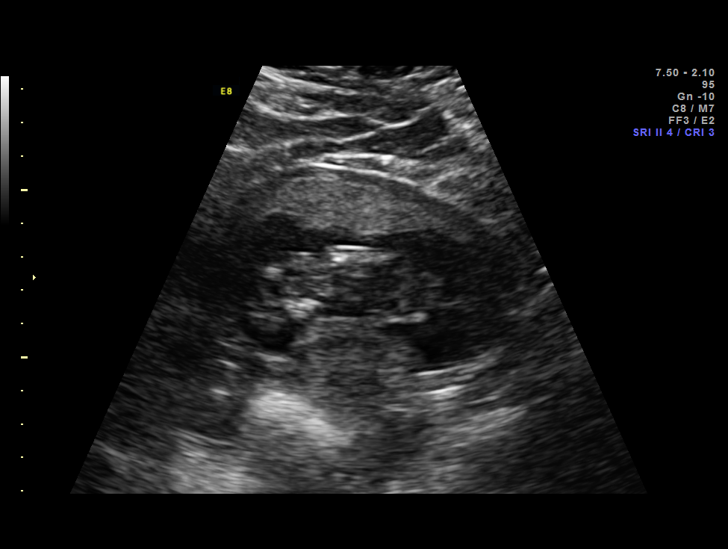
[im 10/53]
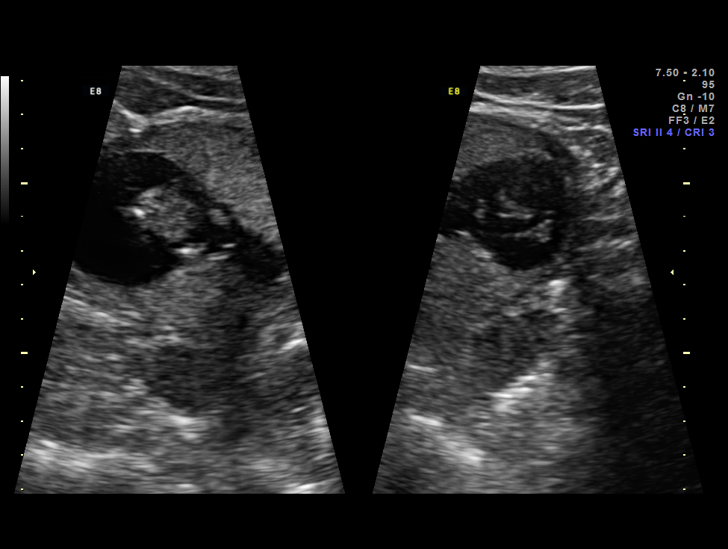
[im 14/53]
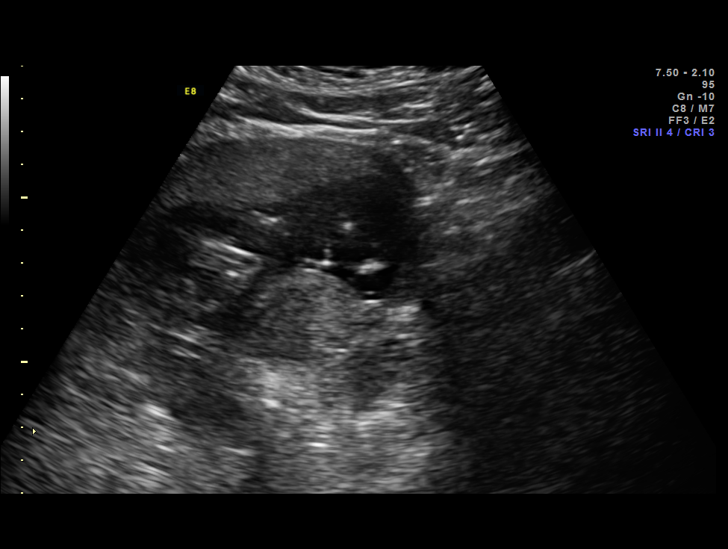
[im 18/53]
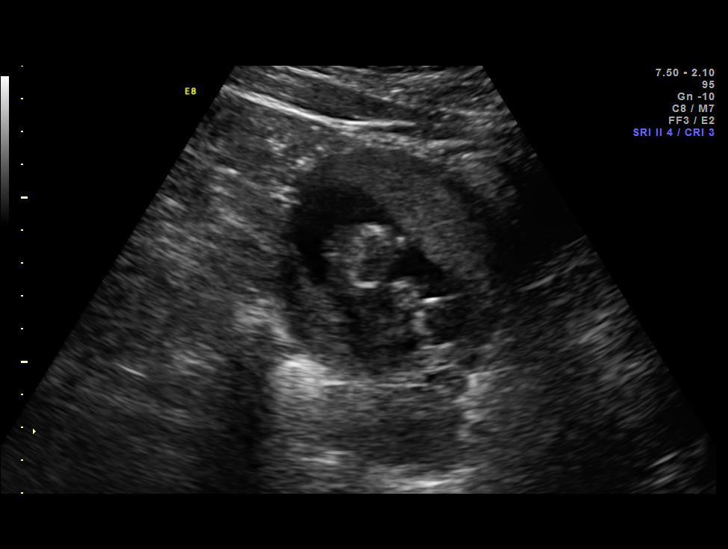
[im 22/53]
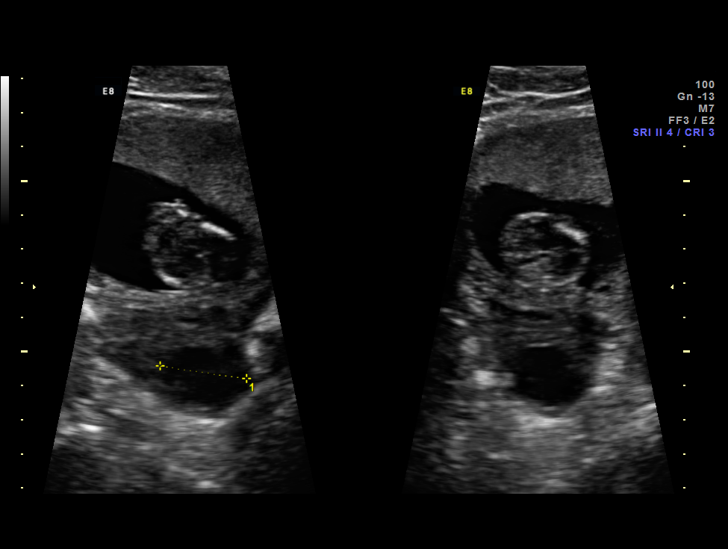
[im 27/53]
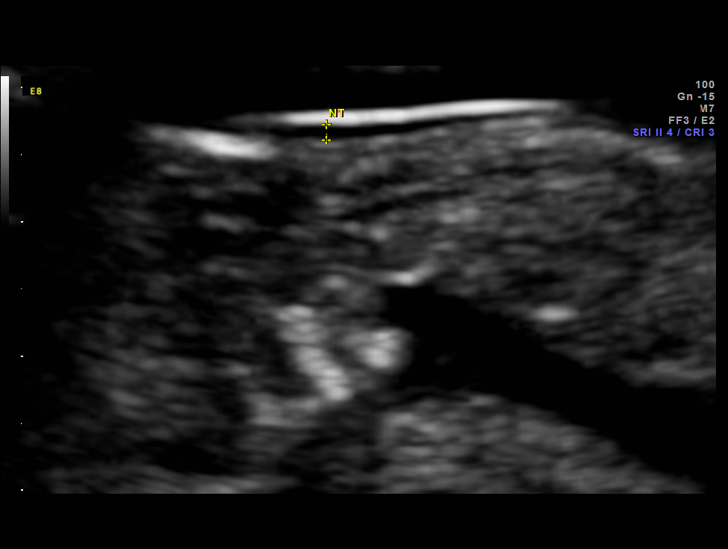
[im 31/53]
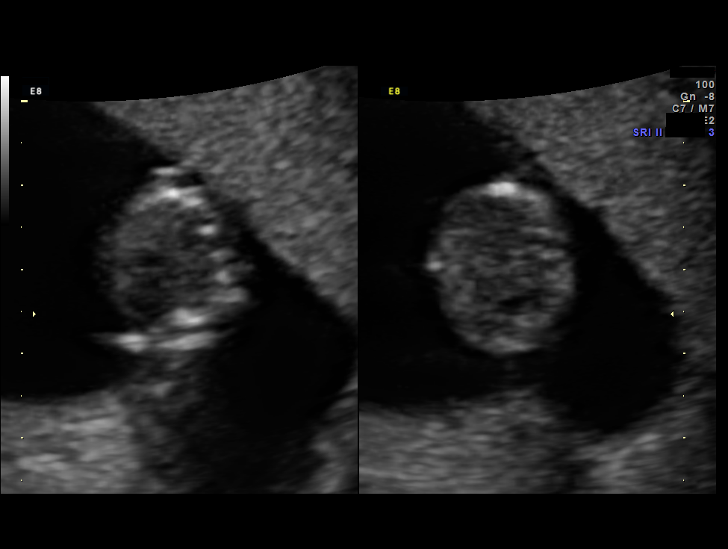
[im 35/53]
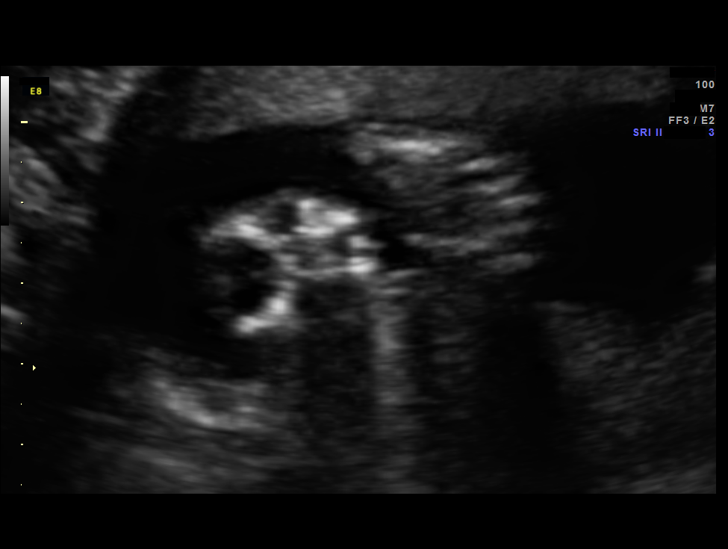
[im 39/53]
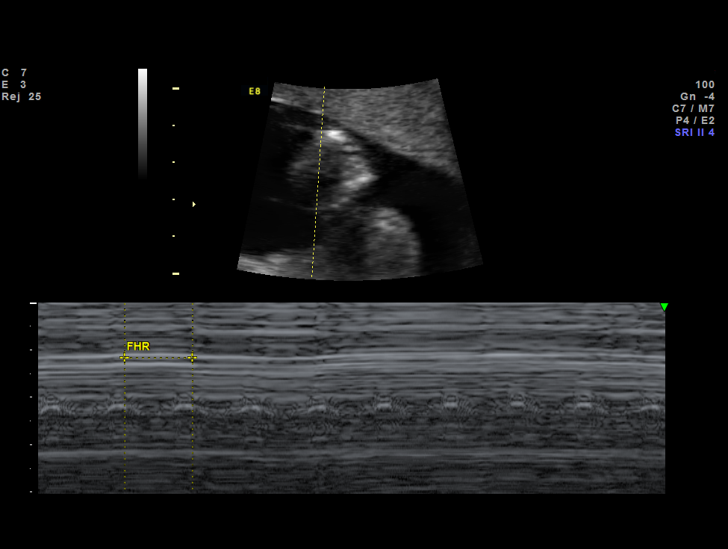
[im 43/53]
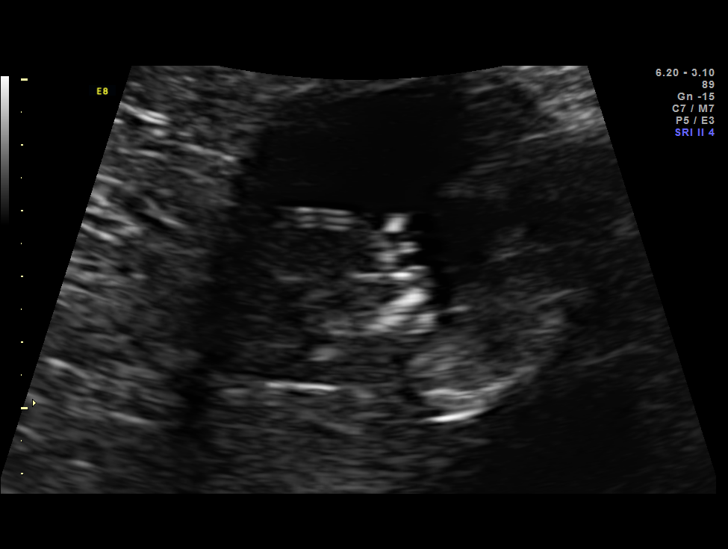
[im 47/53]
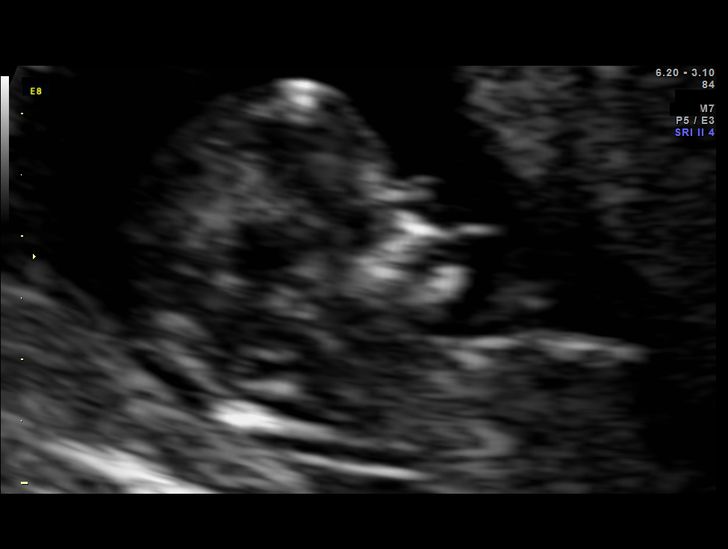
[im 51/53]
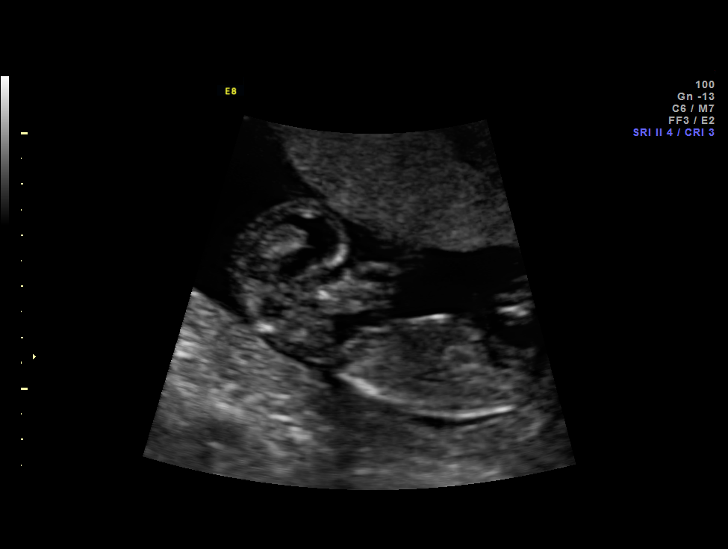

[13 of 28 positions shown; findings below may reference images not displayed]

OBSTETRICS REPORT
(Signed Final [DATE] [DATE])

Service(s) Provided

Indications

12 weeks gestation of pregnancy
First trimester aneuploidy screen (NT)                Z36
Fetal Evaluation

Num Of Fetuses:    1
Preg. Location:    Intrauterine
Fetal Pole:        Visualized
Fetal Heart Rate:  148                          bpm
Cardiac Activity:  Observed
Presentation:      Variable
Placenta:          Anterior
P. Cord            Not well visualized
Insertion:

Amniotic Fluid
AFI FV:      Subjectively within normal limits
Biometry

CRL:     66.5  mm     G. Age:  12w 6d                 EDD:    [DATE]
NT:      1.33  mm
Gestational Age

LMP:           14w 4d        Date:  [DATE]                 EDD:   [DATE]
Best:          12w 2d     Det. By:  Early Ultrasound         EDD:   [DATE]
([DATE])
1st Trimester Genetic Sonogram Screening

CRL:            66.5  mm    G. Age:   12w 6d                 EDD:   [DATE]
Nuc Trans:      1.30  mm

Nasal Bone:                 Present
Anatomy

Cranium:          Appears normal         Lower             Visualized
Extremities:
Choroid Plexus:   Appears normal         Upper             Visualized
Extremities:
Stomach:          Appears normal, left
sided

Other:  Technically difficult due to maternal habitus and fetal position.
Cervix Uterus Adnexa

Cervix:       Normal appearance by transabdominal scan.
Uterus:       No abnormality visualized.
Cul De Sac:   No free fluid seen.
Left Ovary:    Size(cm) L: 3.4 x W: 2.6 x H: 1.95  Volume(cc): 9
Within normal limits.
Right Ovary:   Size(cm) L: 4.31 x W: 3.77 x H: 2.47  Volume(cc): 21
Small corpus luteum noted.
Adnexa:     No abnormality visualized.
Impression

SIUP at 12+2 weeks
No gross abnormalities identified
NT measurement was within normal limits for this GA; NB
present
Normal amniotic fluid volume
Measurements consistent with early US
Recommendations

Offer ONTD screening in the second trimester
Offer anatomy U/S by 18 weeks

## 2015-04-12 ENCOUNTER — Other Ambulatory Visit (HOSPITAL_COMMUNITY): Payer: Self-pay | Admitting: Nurse Practitioner

## 2015-04-12 DIAGNOSIS — Z0489 Encounter for examination and observation for other specified reasons: Secondary | ICD-10-CM

## 2015-04-12 DIAGNOSIS — IMO0002 Reserved for concepts with insufficient information to code with codable children: Secondary | ICD-10-CM

## 2015-04-14 ENCOUNTER — Other Ambulatory Visit (HOSPITAL_COMMUNITY): Payer: Self-pay | Admitting: Nurse Practitioner

## 2015-05-07 ENCOUNTER — Other Ambulatory Visit (HOSPITAL_COMMUNITY): Payer: Self-pay | Admitting: Nurse Practitioner

## 2015-05-07 ENCOUNTER — Ambulatory Visit (HOSPITAL_COMMUNITY)
Admission: RE | Admit: 2015-05-07 | Discharge: 2015-05-07 | Disposition: A | Discharge status: Home / Self Care | Payer: Medicaid Other | Source: Ambulatory Visit | Attending: Nurse Practitioner | Admitting: Nurse Practitioner

## 2015-05-07 DIAGNOSIS — IMO0002 Reserved for concepts with insufficient information to code with codable children: Secondary | ICD-10-CM

## 2015-05-07 DIAGNOSIS — Z3A16 16 weeks gestation of pregnancy: Secondary | ICD-10-CM

## 2015-05-07 DIAGNOSIS — O99210 Obesity complicating pregnancy, unspecified trimester: Secondary | ICD-10-CM

## 2015-05-07 IMAGING — US US OB LIMITED
1 series · 13 of 17 positions shown · non-contrast
Comparison: none

[Series 1: us ob limited · 13 of 17 slices shown]
[im 1/17]
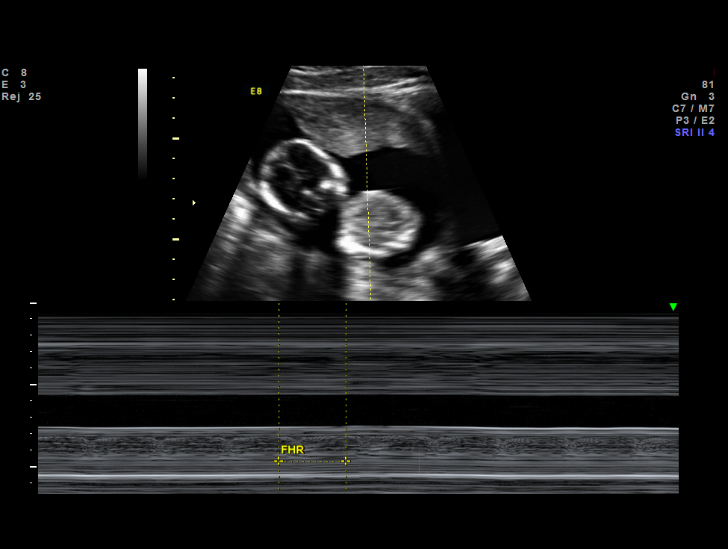
[im 2/17]
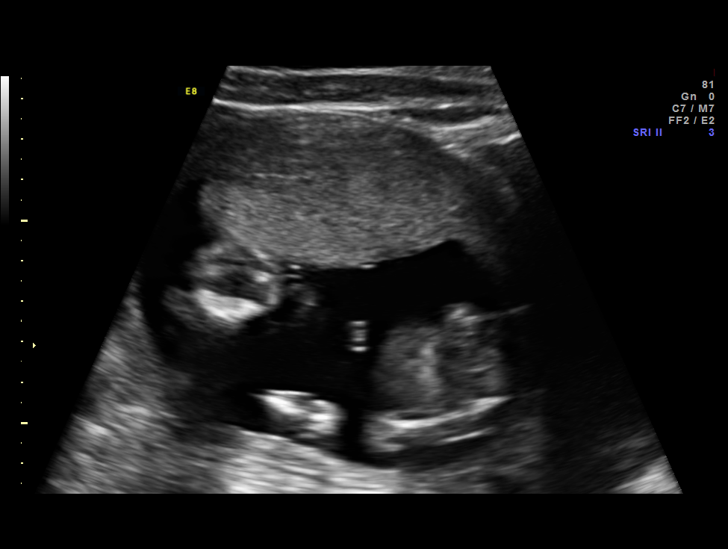
[im 4/17]
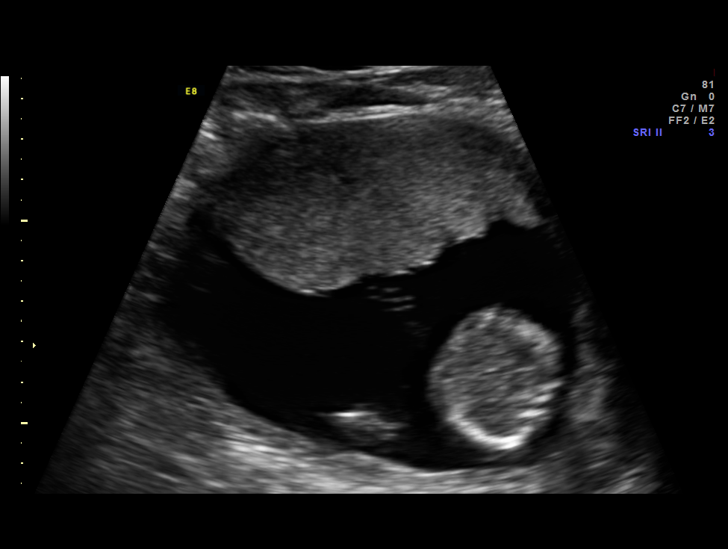
[im 5/17]
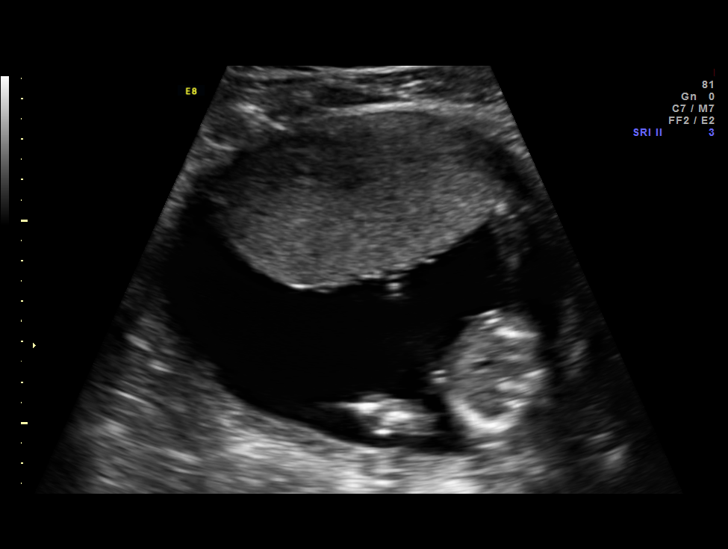
[im 6/17]
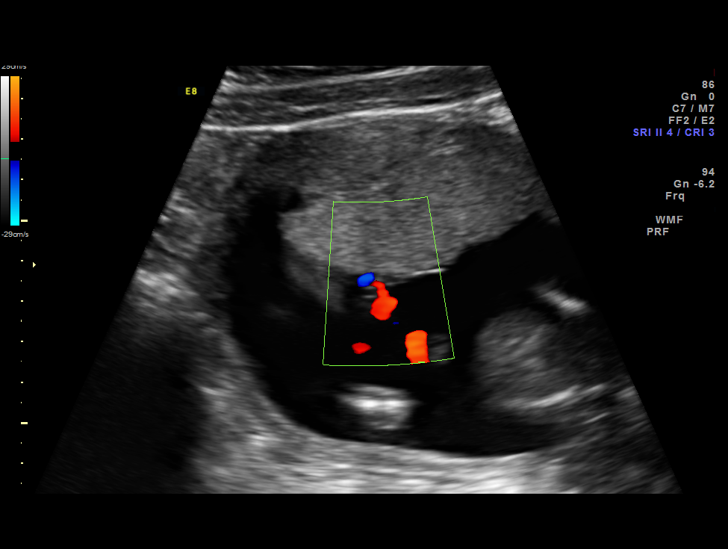
[im 8/17]
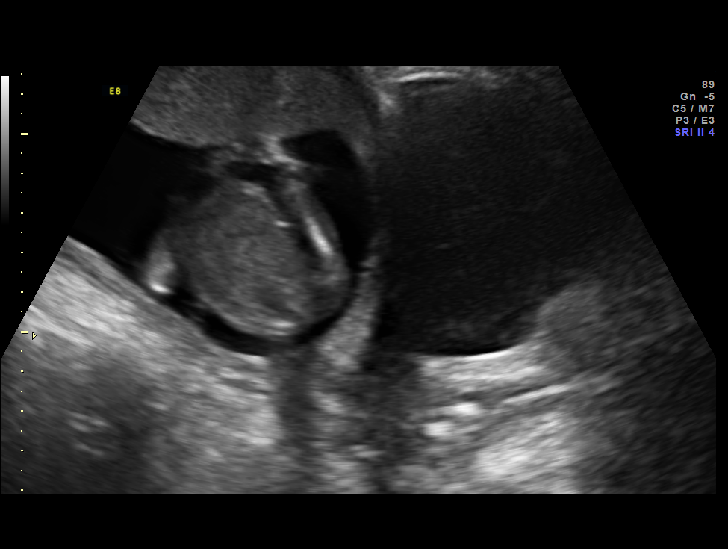
[im 9/17]
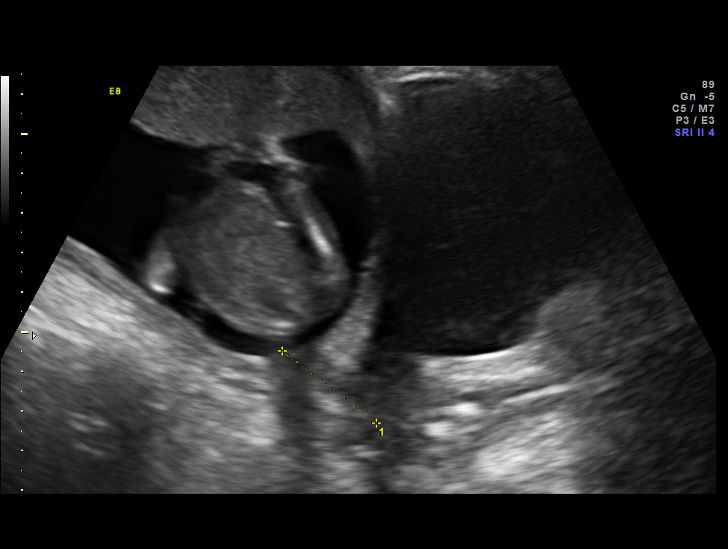
[im 10/17]
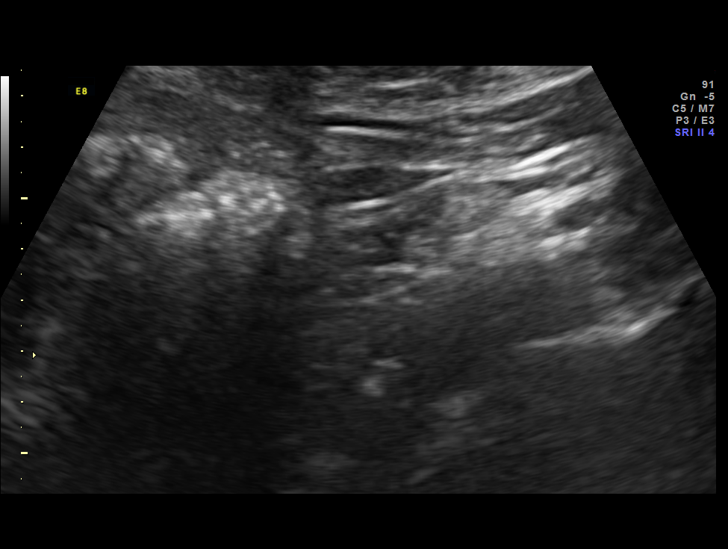
[im 12/17]
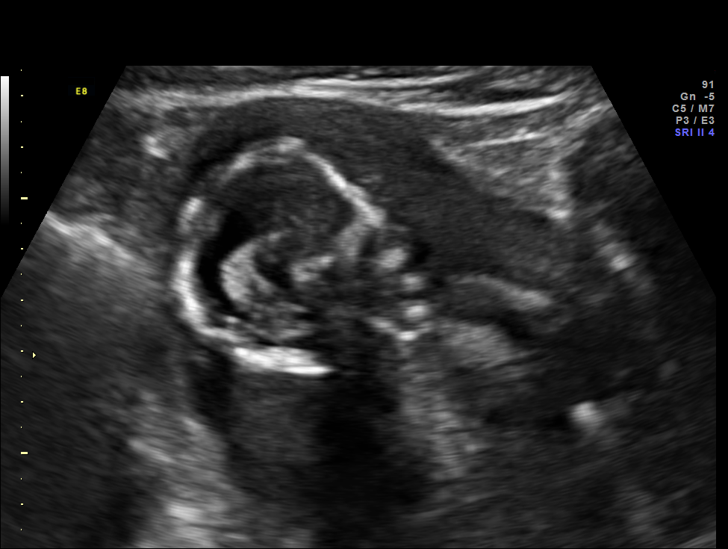
[im 13/17]
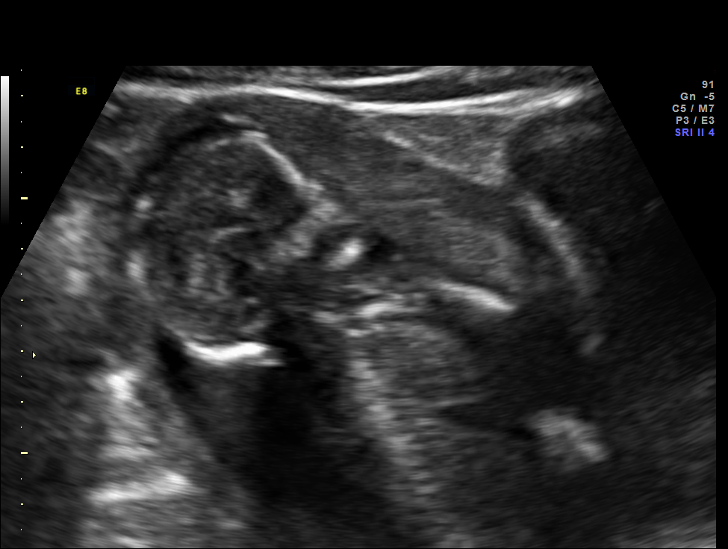
[im 14/17]
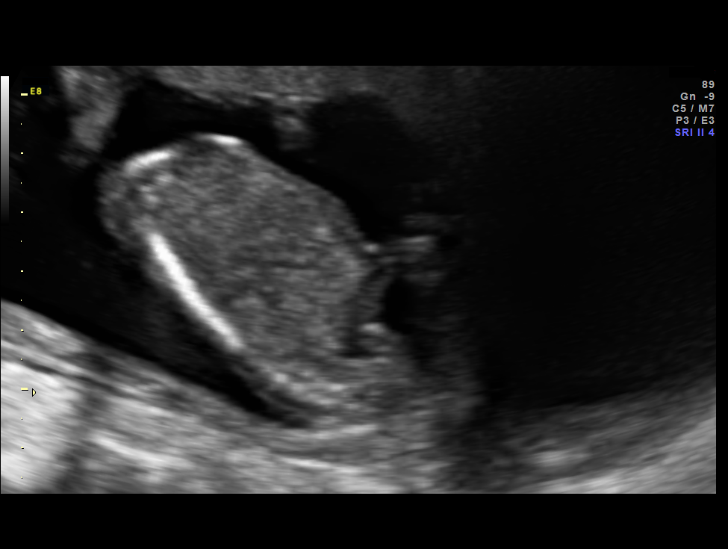
[im 16/17]
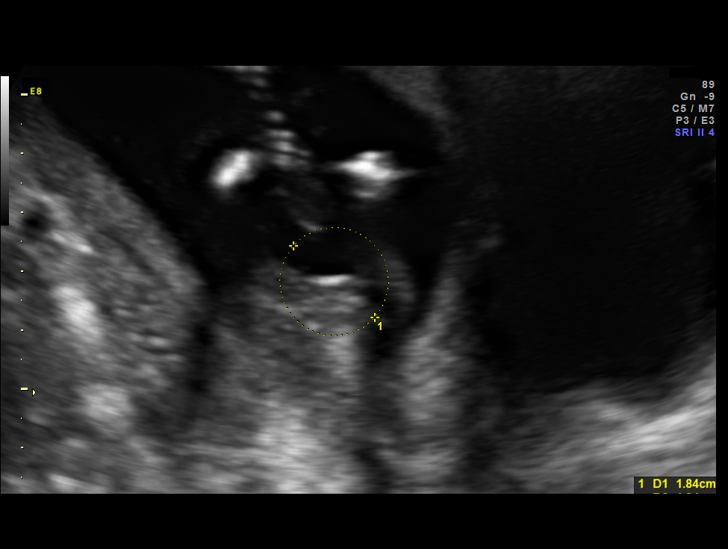
[im 17/17]
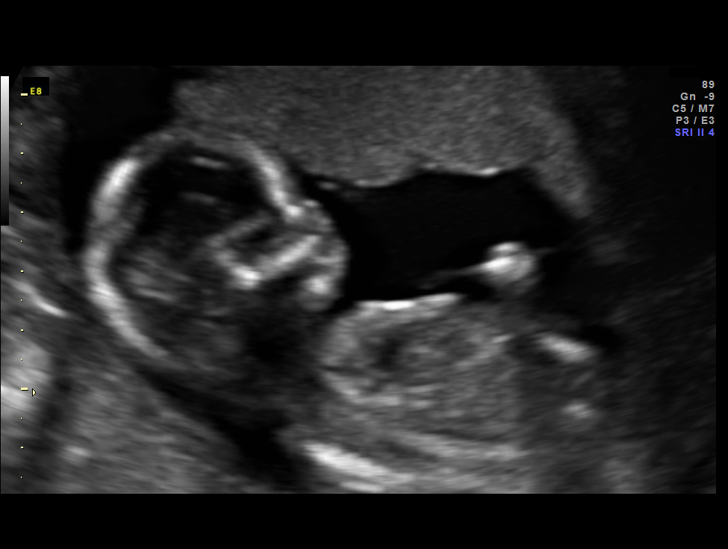

[13 of 17 positions shown; findings below may reference images not displayed]

OBSTETRICS REPORT
(Signed Final [DATE] [DATE])

Service(s) Provided

[HOSPITAL]                                         76815.0
Indications

16 weeks gestation of pregnancy
Obesity complicating pregnancy                        [PB] [PB]
Pregnancy with inconclusive fetal viability           [PB]
Fetal Evaluation

Num Of Fetuses:    1
Fetal Heart Rate:  150                          bpm
Cardiac Activity:  Observed
Presentation:      Breech
Placenta:          Anterior, above cervical os
P. Cord Insertion: Visualized

Amniotic Fluid
AFI FV:      Subjectively within normal limits
Larg Pckt:    3.5  cm
Gestational Age

LMP:           18w 4d        Date:  [DATE]                 EDD:   [DATE]
Best:          16w 2d     Det. By:  Early Ultrasound         EDD:   [DATE]
([DATE])
Cervix Uterus Adnexa

Cervical Length:    3         cm

Cervix:       Normal appearance by transabdominal scan. Appears
closed, without funnelling.

Left Ovary:    Not visualized. No adnexal mass visualized.
Right Ovary:   Not visualized. No adnexal mass visualized.
Impression

SIUP at 16+2 weeks
Cardiac activity present
Normal amniotic fluid volume

Recommendations

Follow-up ultrasound in 2-3 weeks for detailed anatomic
survey

## 2015-05-09 ENCOUNTER — Encounter: Payer: Self-pay | Admitting: Obstetrics & Gynecology

## 2015-05-26 ENCOUNTER — Ambulatory Visit (HOSPITAL_COMMUNITY): Admission: RE | Admit: 2015-05-26 | Payer: Medicaid Other | Source: Ambulatory Visit

## 2015-05-26 ENCOUNTER — Ambulatory Visit (HOSPITAL_COMMUNITY)
Admission: RE | Admit: 2015-05-26 | Discharge: 2015-05-26 | Disposition: A | Discharge status: Home / Self Care | Payer: Medicaid Other | Source: Ambulatory Visit | Attending: Nurse Practitioner | Admitting: Nurse Practitioner

## 2015-05-26 DIAGNOSIS — Z3A19 19 weeks gestation of pregnancy: Secondary | ICD-10-CM | POA: Insufficient documentation

## 2015-05-26 DIAGNOSIS — Z36 Encounter for antenatal screening of mother: Secondary | ICD-10-CM | POA: Diagnosis present

## 2015-05-26 DIAGNOSIS — Z3689 Encounter for other specified antenatal screening: Secondary | ICD-10-CM | POA: Insufficient documentation

## 2015-05-26 IMAGING — US US OB COMP +14 WK
1 series · 12 of 28 positions shown · non-contrast
Comparison: none

[Series 1: us ob +14 all · 75 acquisitions, 12 frames shown]
[im 3/75]
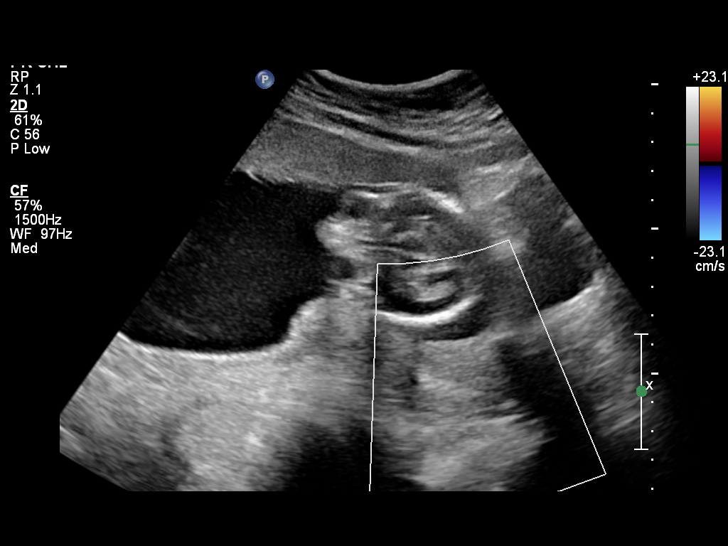
[im 9/75]
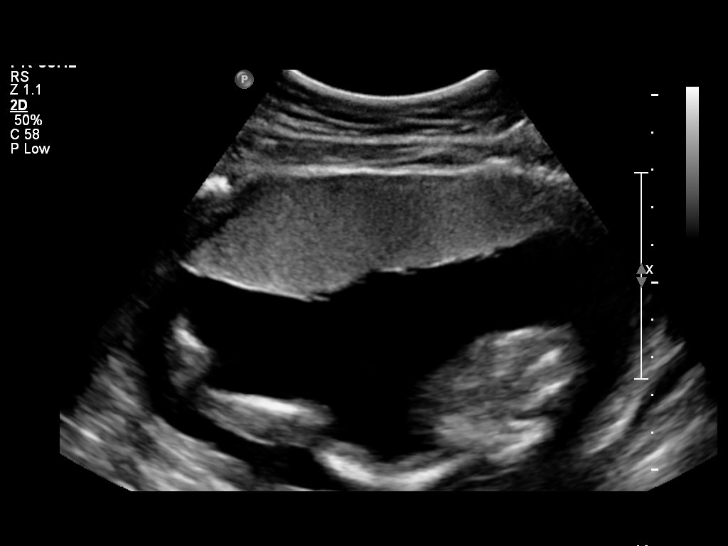
[im 14/75]
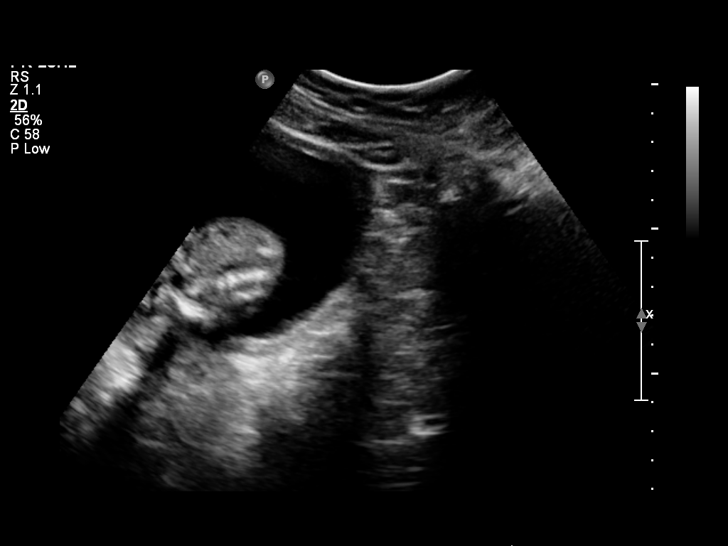
[im 22/75]
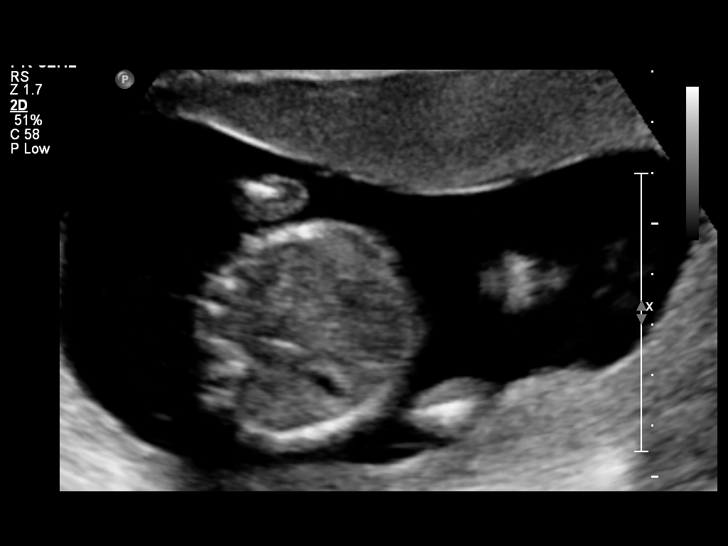
[im 28/75]
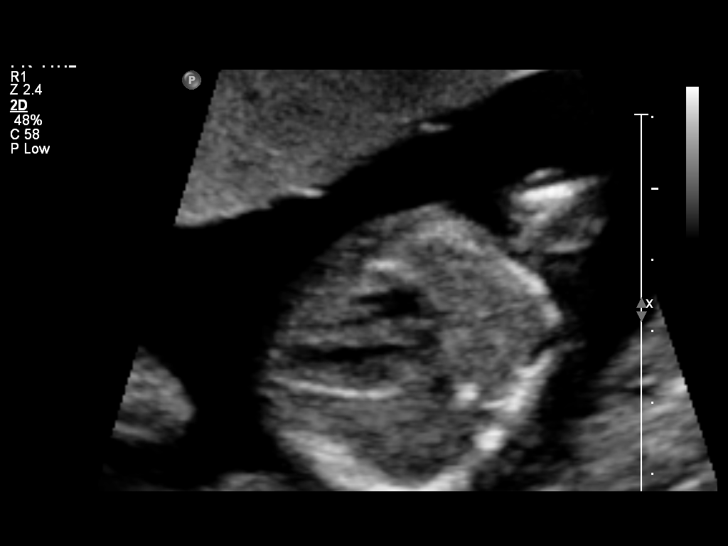
[im 33/75]
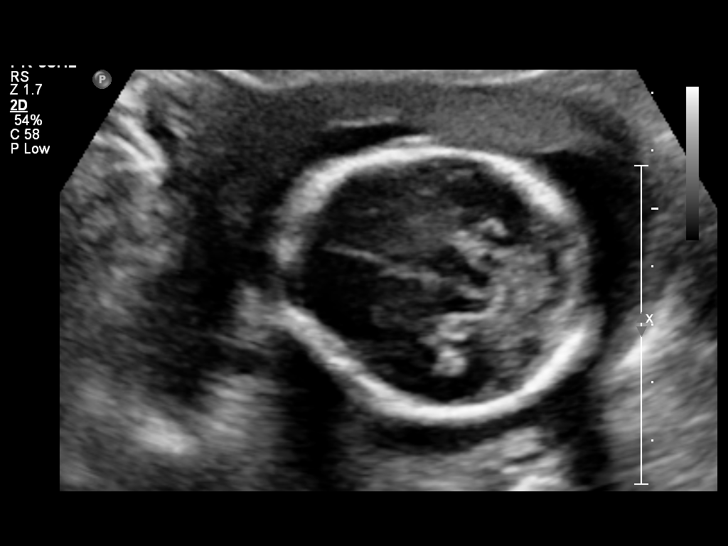
[im 42/75]
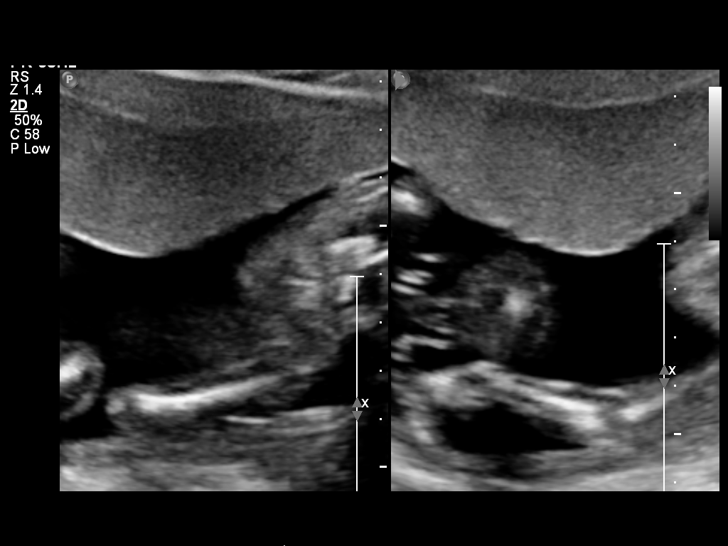
[im 47/75]
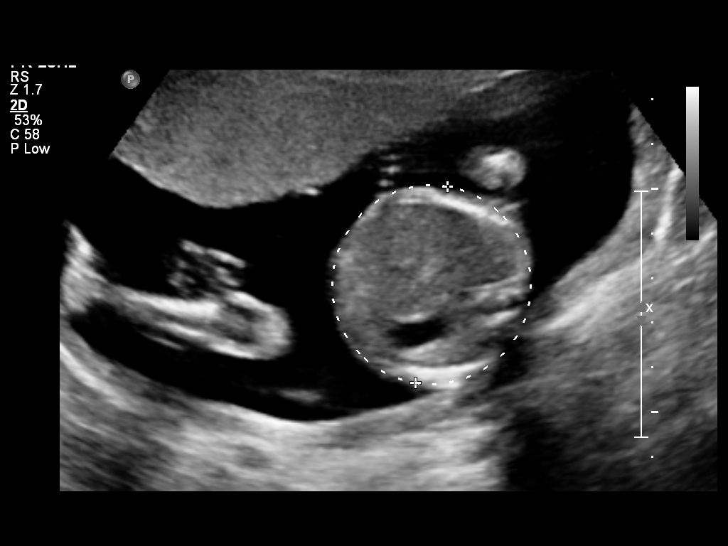
[im 53/75]
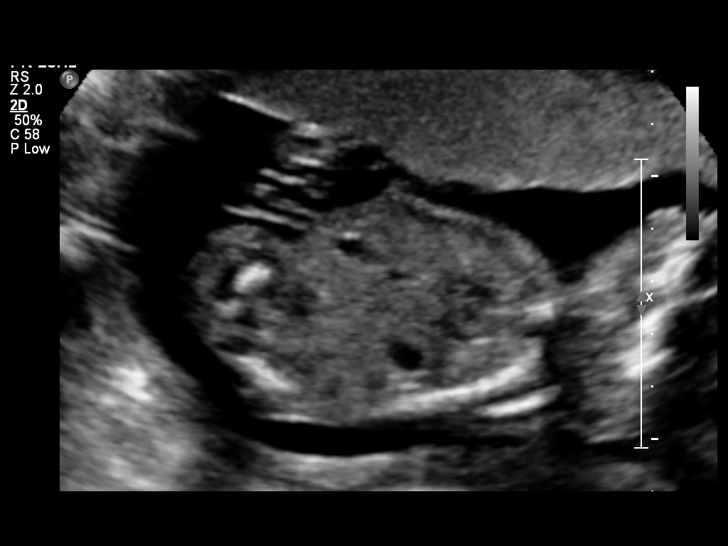
[im 61/75]
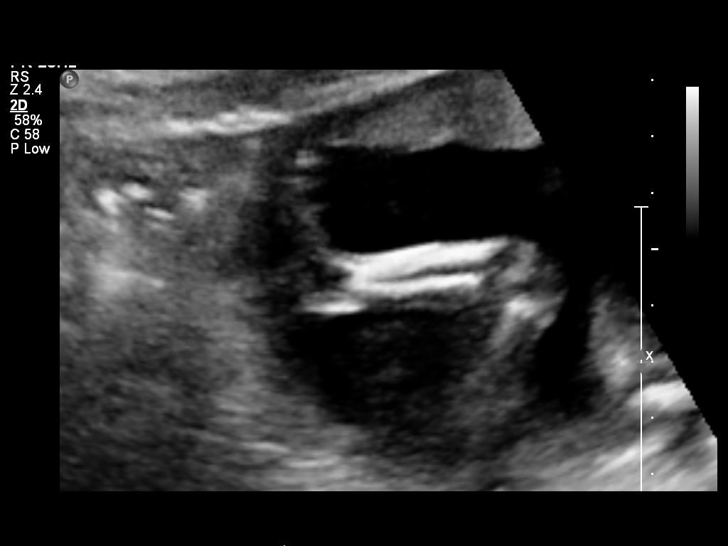
[im 66/75]
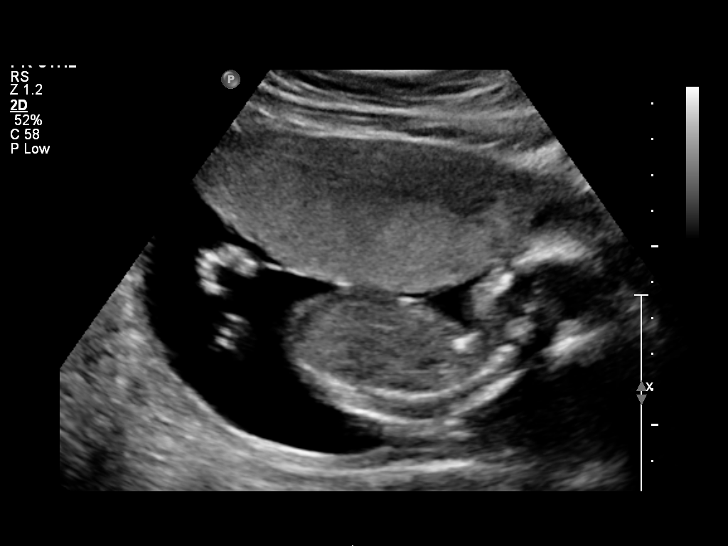
[im 72/75]
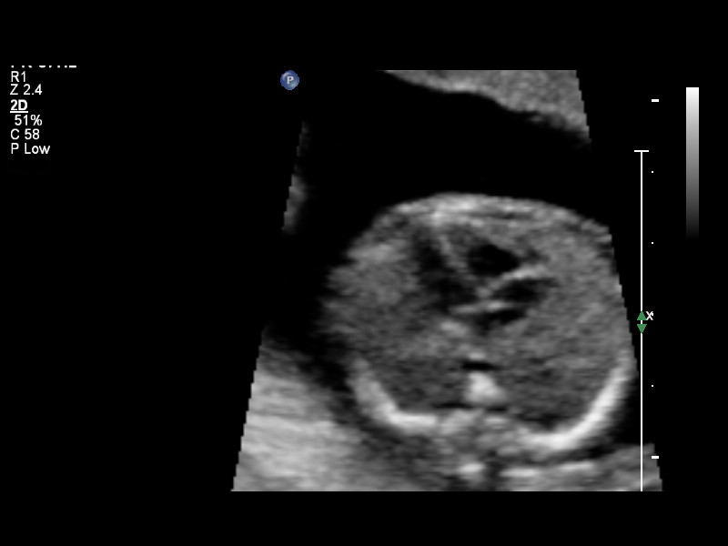

[12 of 28 positions shown; findings below may reference images not displayed]

OBSTETRICS REPORT
(Signed Final [DATE] [DATE])

Service(s) Provided

US OB COMP + 14 WK                                    76805.1
Indications

Basic anatomic survey                                 z36
19 weeks gestation of pregnancy
Fetal Evaluation

Num Of Fetuses:    1
Fetal Heart Rate:  156                          bpm
Cardiac Activity:  Observed
Presentation:      Cephalic
Placenta:          Anterior, above cervical os
P. Cord Insertion: Visualized, central

Amniotic Fluid
AFI FV:      Subjectively within normal limits
Larg Pckt:    7.7  cm
Biometry

BPD:     44.2  mm     G. Age:  19w 3d                CI:        75.42   70 - 86
FL/HC:       17.5  16.1 -
18.3
HC:     161.4  mm     G. Age:  19w 0d       40  %    HC/AC:       1.16  1.09 -
1.39
AC:     139.2  mm     G. Age:  19w 2d       56  %    FL/BPD:
FL:      28.2  mm     G. Age:  18w 5d       31  %    FL/AC:       20.3  20 - 24
HUM:     27.8  mm     G. Age:  18w 6d       48  %
CER:     20.1  mm     G. Age:  19w 1d       52  %

Est. FW:     269  gm      0 lb 9 oz     45  %
Gestational Age

LMP:           21w 2d        Date:  [DATE]                 EDD:   [DATE]
U/S Today:     19w 1d                                        EDD:   [DATE]
Best:          19w 0d     Det. By:  Early Ultrasound         EDD:   [DATE]
([DATE])
Anatomy
Cranium:          Appears normal         Aortic Arch:      Not well visualized
Fetal Cavum:      Appears normal         Ductal Arch:      Appears normal
Ventricles:       Appears normal         Diaphragm:        Appears normal
Choroid Plexus:   Appears normal         Stomach:          Appears normal, left
sided
Cerebellum:       Appears normal         Abdomen:          Appears normal
Posterior Fossa:  Appears normal         Abdominal Wall:   Appears nml (cord
insert, abd wall)
Nuchal Fold:      Appears normal         Cord Vessels:     Appears normal (3
vessel cord)
Face:             Orbits nl; profile not Kidneys:          Appear normal
well visualized
Lips:             Appears normal         Bladder:          Appears normal
Palate:           Not well visualized    Spine:            Not well visualized
Heart:            Appears normal         Lower             Visualized
(4CH, axis, and        Extremities:
situs)
RVOT:             Not well visualized    Upper             Visualized
Extremities:
LVOT:             Appears normal

Other:  Fetus appears to be a female. Heels visualized. Technically difficult
due to maternal habitus and fetal position.
Targeted Anatomy

Fetal Central Nervous System
Lat. Ventricles:
Cervix Uterus Adnexa

Cervical Length:    3.3       cm

Cervix:       Normal appearance by transabdominal scan.

Left Ovary:    Previously seen.
Right Ovary:   Previously seen

Adnexa:     No abnormality visualized.
Impression

SIUP at 19+0 weeks
Normal detailed fetal anatomy; limited views of face, heart and
spine
Markers of aneuploidy: none
Normal amniotic fluid volume
Measurements consistent with prior US
Recommendations

Follow-up ultrasound in 4-6 weeks to complete anatomy
survey

## 2015-06-17 ENCOUNTER — Inpatient Hospital Stay (HOSPITAL_COMMUNITY)
Admission: AD | Admit: 2015-06-17 | Discharge: 2015-06-17 | Disposition: A | Discharge status: Home / Self Care | Payer: Medicaid Other | Source: Ambulatory Visit | Attending: Obstetrics & Gynecology | Admitting: Obstetrics & Gynecology

## 2015-06-17 ENCOUNTER — Encounter (HOSPITAL_COMMUNITY): Payer: Self-pay

## 2015-06-17 DIAGNOSIS — R197 Diarrhea, unspecified: Secondary | ICD-10-CM

## 2015-06-17 DIAGNOSIS — R109 Unspecified abdominal pain: Secondary | ICD-10-CM | POA: Insufficient documentation

## 2015-06-17 DIAGNOSIS — O98312 Other infections with a predominantly sexual mode of transmission complicating pregnancy, second trimester: Secondary | ICD-10-CM | POA: Insufficient documentation

## 2015-06-17 DIAGNOSIS — A5901 Trichomonal vulvovaginitis: Secondary | ICD-10-CM | POA: Insufficient documentation

## 2015-06-17 DIAGNOSIS — Z3A22 22 weeks gestation of pregnancy: Secondary | ICD-10-CM | POA: Diagnosis not present

## 2015-06-17 LAB — COMPREHENSIVE METABOLIC PANEL
ALK PHOS: 79 U/L (ref 38–126)
ALT: 19 U/L (ref 14–54)
AST: 17 U/L (ref 15–41)
Albumin: 3.6 g/dL (ref 3.5–5.0)
Anion gap: 8 (ref 5–15)
BUN: <5 mg/dL — ABNORMAL LOW (ref 6–20)
CO2: 22 mmol/L (ref 22–32)
Calcium: 9.3 mg/dL (ref 8.9–10.3)
Chloride: 104 mmol/L (ref 101–111)
Creatinine, Ser: 0.45 mg/dL (ref 0.44–1.00)
GFR calc Af Amer: >60 mL/min (ref >60)
GFR calc non Af Amer: >60 mL/min (ref >60)
Glucose, Bld: 80 mg/dL (ref 65–99)
Potassium: 3.4 mmol/L — ABNORMAL LOW (ref 3.5–5.1)
Sodium: 134 mmol/L — ABNORMAL LOW (ref 135–145)
Total Bilirubin: 0.6 mg/dL (ref 0.3–1.2)
Total Protein: 7.4 g/dL (ref 6.5–8.1)

## 2015-06-17 LAB — URINE MICROSCOPIC-ADD ON

## 2015-06-17 LAB — CBC WITH DIFFERENTIAL/PLATELET
BASOS ABS: 0 10*3/uL (ref 0.0–0.1)
Basophils Relative: 0 % (ref 0–1)
EOS PCT: 1 % (ref 0–5)
Eosinophils Absolute: 0.1 10*3/uL (ref 0.0–0.7)
HCT: 41.9 % (ref 36.0–46.0)
HEMOGLOBIN: 13.9 g/dL (ref 12.0–15.0)
Lymphocytes Relative: 21 % (ref 12–46)
Lymphs Abs: 2.4 10*3/uL (ref 0.7–4.0)
MCH: 27.3 pg (ref 26.0–34.0)
MCHC: 33.2 g/dL (ref 30.0–36.0)
MCV: 82.2 fL (ref 78.0–100.0)
MONO ABS: 0.7 10*3/uL (ref 0.1–1.0)
Monocytes Relative: 6 % (ref 3–12)
NEUTROS ABS: 8.6 10*3/uL — AB (ref 1.7–7.7)
NEUTROS PCT: 72 % (ref 43–77)
PLATELETS: 233 10*3/uL (ref 150–400)
RBC: 5.1 MIL/uL (ref 3.87–5.11)
RDW: 14.3 % (ref 11.5–15.5)
WBC: 11.8 10*3/uL — ABNORMAL HIGH (ref 4.0–10.5)

## 2015-06-17 LAB — URINALYSIS, ROUTINE W REFLEX MICROSCOPIC
BILIRUBIN URINE: NEGATIVE
Glucose, UA: NEGATIVE mg/dL
Hgb urine dipstick: NEGATIVE
KETONES UR: NEGATIVE mg/dL
NITRITE: NEGATIVE
PH: 5.5 (ref 5.0–8.0)
Protein, ur: NEGATIVE mg/dL
Specific Gravity, Urine: 1.015 (ref 1.005–1.030)
UROBILINOGEN UA: 0.2 mg/dL (ref 0.0–1.0)

## 2015-06-17 LAB — WET PREP, GENITAL: Yeast Wet Prep HPF POC: NONE SEEN

## 2015-06-17 MED ORDER — ONDANSETRON 4 MG PO TBDP
4.0000 mg | ORAL_TABLET | Freq: Once | ORAL | Status: AC
  Administered 2015-06-17: 4 mg via ORAL
  Filled 2015-06-17: qty 1

## 2015-06-17 MED ORDER — GI COCKTAIL ~~LOC~~
30.0000 mL | Freq: Once | ORAL | Status: AC
  Administered 2015-06-17: 30 mL via ORAL
  Filled 2015-06-17: qty 30

## 2015-06-17 MED ORDER — METRONIDAZOLE 500 MG PO TABS
2000.0000 mg | ORAL_TABLET | Freq: Once | ORAL | Status: AC
  Administered 2015-06-17: 2000 mg via ORAL
  Filled 2015-06-17: qty 4

## 2015-06-17 NOTE — Discharge Instructions (Signed)
Chronic Diarrhea  Diarrhea is frequent loose and watery bowel movements. It can cause you to feel weak and dehydrated. Dehydration can cause you to become tired and thirsty and to have a dry mouth, decreased urination, and dark yellow urine. Diarrhea is a sign of another problem, most often an infection that will not last long. In most cases, diarrhea lasts 2-3 days. Diarrhea that lasts longer than 4 weeks is called long-lasting (chronic) diarrhea. It is important to treat your diarrhea as directed by your health care provider to lessen or prevent future episodes of diarrhea.   CAUSES   There are many causes of chronic diarrhea. The following are some possible causes:   · Gastrointestinal infections caused by viruses, bacteria, or parasites.    · Food poisoning or food allergies.    · Certain medicines, such as antibiotics, chemotherapy, and laxatives.    · Artificial sweeteners and fructose.    · Digestive disorders, such as celiac disease and inflammatory bowel diseases.    · Irritable bowel syndrome.  · Some disorders of the pancreas.  · Disorders of the thyroid.  · Reduced blood flow to the intestines.  · Cancer.  Sometimes the cause of chronic diarrhea is unknown.  RISK FACTORS  · Having a severely weakened immune system, such as from HIV or AIDS.    · Taking certain types of cancer-fighting drugs (such as with chemotherapy) or other medicines.    · Having had a recent organ transplant.    · Having a portion of the stomach or small bowel removed.    · Traveling to countries where food and water supplies are often contaminated.    SYMPTOMS   In addition to frequent, loose stools, diarrhea may cause:   · Cramping.    · Abdominal pain.    · Nausea.    · Fever.  · Fatigue.  · Urgent need to use the bathroom.  · Loss of bowel control.  DIAGNOSIS   Your health care provider must take a careful history and perform a physical exam. Tests given are based on your symptoms and history. Tests may include:   · Blood or  stool tests. Three or more stool samples may be examined. Stool cultures may be used to test for bacteria or parasites.    · X-rays.    · A procedure in which a thin tube is inserted into the mouth or rectum (endoscopy). This allows the health care provider to look inside the intestine.    TREATMENT   · Treatment is aimed at correcting the cause of the diarrhea when possible.  · Diarrhea caused by an infection can often be treated with antibiotic medicines.  · Diarrhea not caused by an infection may require you to take long-term medicine or have surgery. Specific treatment should be discussed with your health care provider.  · If the cause cannot be determined, treatment aims to relieve symptoms and prevent dehydration. Serious health problems can occur if you do not maintain proper fluid levels. Treatment may include:  ¨ Taking an oral rehydration solution (ORS).  ¨ Not drinking beverages that contain caffeine (such as tea, coffee, and soft drinks).  ¨ Not drinking alcohol.  ¨ Maintaining well-balanced nutrition to help you recover faster.  HOME CARE INSTRUCTIONS   · Drink enough fluids to keep urine clear or pale yellow. Drink 1 cup (8 oz) of fluid for each diarrhea episode. Avoid fluids that contain simple sugars, fruit juices, whole milk products, and sodas. Hydrate with an ORS. You may purchase the ORS or prepare it at home by mixing the   following ingredients together:  ¨  - tsp (1.7-3  mL) table salt.  ¨ ¾ tsp (3 ¾ mL) baking soda.  ¨  tsp (1.7 mL) salt substitute containing potassium chloride.  ¨ 1 tbsp (20 mL) sugar.  ¨ 4.2 c (1 L) of water.    · Certain foods and beverages may increase the speed at which food moves through the gastrointestinal (GI) tract. These foods and beverages should be avoided. They include:  ¨ Caffeinated and alcoholic beverages.  ¨ High-fiber foods, such as raw fruits and vegetables, nuts, seeds, and whole grain breads and cereals.  ¨ Foods and beverages sweetened with sugar  alcohols, such as xylitol, sorbitol, and mannitol.    · Some foods may be well tolerated and may help thicken stool. These include:  ¨ Starchy foods, such as rice, toast, pasta, low-sugar cereal, oatmeal, grits, baked potatoes, crackers, and bagels.  ¨ Bananas.  ¨ Applesauce.  · Add probiotic-rich foods to help increase healthy bacteria in the GI tract. These include yogurt and fermented milk products.  · Wash your hands well after each diarrhea episode.  · Only take over-the-counter or prescription medicines as directed by your health care provider.  · Take a warm bath to relieve any burning or pain from frequent diarrhea episodes.  SEEK MEDICAL CARE IF:   · You are not urinating as often.  · Your urine is a dark color.  · You become very tired or dizzy.  · You have severe pain in the abdomen or rectum.  · Your have blood or pus in your stools.  · Your stools look black and tarry.  SEEK IMMEDIATE MEDICAL CARE IF:   · You are unable to keep fluids down.  · You have persistent vomiting.  · You have blood in your stool.  · Your stools are black and tarry.  · You do not urinate in 6-8 hours, or there is only a small amount of very dark urine.  · You have abdominal pain that increases or localizes.  · You have weakness, dizziness, confusion, or lightheadedness.  · You have a severe headache.  · Your diarrhea gets worse or does not get better.  · You have a fever or persistent symptoms for more than 2-3 days.  · You have a fever and your symptoms suddenly get worse.  MAKE SURE YOU:   · Understand these instructions.  · Will watch your condition.  · Will get help right away if you are not doing well or get worse.  Document Released: 02/03/2004 Document Revised: 11/18/2013 Document Reviewed: 05/08/2013  [employer]® Patient Information ©2015 [employer]. This information is not intended to replace advice given to you by your health care provider. Make sure you discuss any questions you have with your health care  provider.

## 2015-06-17 NOTE — MAU Provider Note (Signed)
Addendum to note by Wynelle Bourgeois, CNM:  Pt feels "much much better" after GI cocktail and requests discharge.  Has not had a stool in the 6 hours she has been at [facility].    DC home, F/U as scheduled for next OB appointment.

## 2015-06-17 NOTE — MAU Provider Note (Signed)
History     CSN: [id number]  Arrival date and time: 06/17/15 1518   None     Chief Complaint  Patient presents with  . Diarrhea  . Abdominal Cramping   This is a 24 y.o. female at [redacted]w[redacted]d who presents with c/o diarrhea and abdominal cramping x 1 week. Told RN she had 2 stools per day but tells me she has one every 10 minutes for one week straight. Cramps are all over her belly, mostly upper abdomen. Do not radiate. Denies bleeding or vaginal discharge. [facility]    Abdominal Pain This is a new problem. The current episode started in the past 7 days. The onset quality is gradual. The problem occurs intermittently. The problem has been unchanged. The pain is located in the RUQ, LUQ and epigastric region. The pain is moderate. The quality of the pain is cramping. The abdominal pain does not radiate. Associated symptoms include diarrhea and nausea. Pertinent negatives include no anorexia, constipation, dysuria, fever, frequency, headaches, myalgias or vomiting. Nothing aggravates the pain. The pain is relieved by nothing. She has tried nothing for the symptoms.   RN note: Pt c/o diarrhea and abd cramps x 1 week.          OB History    Gravida Para Term Preterm AB TAB SAB Ectopic Multiple Living   1               Past Medical History  Diagnosis Date  . Mental disorder   . Depression   . Sleep apnea   . PID (pelvic inflammatory disease)   . Gonorrhea   . Chlamydia     Past Surgical History  Procedure Laterality Date  . Knee surgery      History reviewed. No pertinent family history.  History  Substance Use Topics  . Smoking status: Never Smoker   . Smokeless tobacco: Never Used  . Alcohol Use: No    Allergies: No Known Allergies  Prescriptions prior to admission  Medication Sig Dispense Refill Last Dose  . omeprazole (PRILOSEC) 20 MG capsule Take 1 capsule (20 mg total) by mouth daily. (Patient taking differently: Take 20 mg by mouth daily as  needed. ) 30 capsule 8 Past Month at Unknown time  . Prenatal Multivit-Min-Fe-FA (PRENATAL VITAMINS) 0.8 MG tablet Take 1 tablet by mouth daily. 30 tablet 12 06/17/2015 at Unknown time  . ondansetron (ZOFRAN ODT) 8 MG disintegrating tablet Take 1 tablet (8 mg total) by mouth every 8 (eight) hours as needed for nausea or vomiting. 20 tablet 0 prn  . promethazine (PHENERGAN) 25 MG tablet Take 1 tablet (25 mg total) by mouth every 6 (six) hours as needed for nausea or vomiting. (Patient not taking: Reported on 04/09/2015) 30 tablet 1 prn   Medical, Surgical, Family and Social histories reviewed and are listed above.  Medications and allergies reviewed.    Review of Systems  Constitutional: Positive for malaise/fatigue. Negative for fever and chills.  Gastrointestinal: Positive for nausea, abdominal pain and diarrhea. Negative for vomiting, constipation and anorexia.  Genitourinary: Negative for dysuria, urgency and frequency.  Musculoskeletal: Negative for myalgias and back pain.  Neurological: Negative for dizziness and headaches.  Other systems negative  Physical Exam   Blood pressure 132/81, pulse 92, temperature 98.6 F (37 C), resp. rate 18, height  (1.549 m), weight 259 lb 9.6 oz (117.754 kg), last menstrual period 12/28/2014.  Physical Exam  Constitutional: She is oriented to person, place, and time.  She appears well-developed and well-nourished. No distress.  HENT:  Head: Normocephalic.  Cardiovascular: Normal rate and regular rhythm.  Exam reveals no gallop and no friction rub.   No murmur heard. Respiratory: Effort normal and breath sounds normal. No respiratory distress. She has no wheezes. She has no rales. She exhibits no tenderness.  GI: Soft. She exhibits distension (mildly distended). She exhibits no mass. There is tenderness (diffusely tender with more tendernness LUQ). There is no rebound and no guarding.  Genitourinary: Vaginal discharge (copious thick yellow milky  discharge with + whiff) found.  Cervix long closed  Musculoskeletal: Normal range of motion.  Neurological: She is alert and oriented to person, place, and time.  Skin: Skin is warm and dry. She is not diaphoretic.  Psychiatric: She has a normal mood and affect.    MAU Course  Procedures  MDM Labs ordered to investigate metabolic disturbance, electrolyte imbalance, elevated WBC Urine showed trichomonas Patient informed (she verbally stated it was OK to discuss in front of her father)  She states she has not had sex since conception. I ordered and did cultures and wet prep to confirm  Results for orders placed or performed during the hospital encounter of 06/17/15 (from the past 24 hour(s))  Urinalysis, Routine w reflex microscopic (not at [facility])     Status: Abnormal   Collection Time: 06/17/15  4:00 PM  Result Value Ref Range   Color, Urine YELLOW YELLOW   APPearance CLEAR CLEAR   Specific Gravity, Urine 1.015 1.005 - 1.030   pH 5.5 5.0 - 8.0   Glucose, UA NEGATIVE NEGATIVE mg/dL   Hgb urine dipstick NEGATIVE NEGATIVE   Bilirubin Urine NEGATIVE NEGATIVE   Ketones, ur NEGATIVE NEGATIVE mg/dL   Protein, ur NEGATIVE NEGATIVE mg/dL   Urobilinogen, UA 0.2 0.0 - 1.0 mg/dL   Nitrite NEGATIVE NEGATIVE   Leukocytes, UA LARGE (A) NEGATIVE  Urine microscopic-add on     Status: Abnormal   Collection Time: 06/17/15  4:00 PM  Result Value Ref Range   Squamous Epithelial / LPF MANY (A) RARE   WBC, UA 7-10 <3 WBC/hpf   RBC / HPF 3-6 <3 RBC/hpf   Bacteria, UA MANY (A) RARE   Urine-Other TRICHOMONAS PRESENT   CBC with Differential     Status: Abnormal   Collection Time: 06/17/15  7:13 PM  Result Value Ref Range   WBC 11.8 (H) 4.0 - 10.5 K/uL   RBC 5.10 3.87 - 5.11 MIL/uL   Hemoglobin 13.9 12.0 - 15.0 g/dL   HCT 45.4 09.8 - 11.9 %   MCV 82.2 78.0 - 100.0 fL   MCH 27.3 26.0 - 34.0 pg   MCHC 33.2 30.0 - 36.0 g/dL   RDW 14.7 82.9 - 56.2 %   Platelets 233 150 - 400 K/uL   Neutrophils  Relative % 72 43 - 77 %   Neutro Abs 8.6 (H) 1.7 - 7.7 K/uL   Lymphocytes Relative 21 12 - 46 %   Lymphs Abs 2.4 0.7 - 4.0 K/uL   Monocytes Relative 6 3 - 12 %   Monocytes Absolute 0.7 0.1 - 1.0 K/uL   Eosinophils Relative 1 0 - 5 %   Eosinophils Absolute 0.1 0.0 - 0.7 K/uL   Basophils Relative 0 0 - 1 %   Basophils Absolute 0.0 0.0 - 0.1 K/uL  Comprehensive metabolic panel     Status: Abnormal   Collection Time: 06/17/15  7:13 PM  Result Value Ref Range   Sodium 134 (  L) 135 - 145 mmol/L   Potassium 3.4 (L) 3.5 - 5.1 mmol/L   Chloride 104 101 - 111 mmol/L   CO2 22 22 - 32 mmol/L   Glucose, Bld 80 65 - 99 mg/dL   BUN <5 (L) 6 - 20 mg/dL   Creatinine, Ser 1.61 0.44 - 1.00 mg/dL   Calcium 9.3 8.9 - 09.6 mg/dL   Total Protein 7.4 6.5 - 8.1 g/dL   Albumin 3.6 3.5 - 5.0 g/dL   AST 17 15 - 41 U/L   ALT 19 14 - 54 U/L   Alkaline Phosphatase 79 38 - 126 U/L   Total Bilirubin 0.6 0.3 - 1.2 mg/dL   GFR calc non Af Amer >60 >60 mL/min   GFR calc Af Amer >60 >60 mL/min   Anion gap 8 5 - 15  Wet prep, genital     Status: Abnormal   Collection Time: 06/17/15  7:40 PM  Result Value Ref Range   Yeast Wet Prep HPF POC NONE SEEN NONE SEEN   Trich, Wet Prep MODERATE (A) NONE SEEN   Clue Cells Wet Prep HPF POC FEW (A) NONE SEEN   WBC, Wet Prep HPF POC MODERATE (A) NONE SEEN   GI Cocktail ordered to help with upper abdominal cramping Has not had any diarrhea stools since here Stool culture ordered  Assessment and Plan  A:  SIUP at [redacted]w[redacted]d        Reported diarrhea for one week, but no evidence of it while here       Labs normal except Trichomonas confirmed  P:  Will give GI cocktail for gas pains. If it does not work, consider adding Levsin       Will treat Trich while here with Flagyl        Report given to oncoming CNM  [facility] 06/17/2015, 6:04 PM

## 2015-06-17 NOTE — MAU Note (Signed)
Pt c/o diarrhea and abd cramps x 1 week.

## 2015-06-17 NOTE — MAU Note (Signed)
F Dishmon CNM given update on pt's pain and will come to assess patient for possible discharge.

## 2015-06-18 LAB — GC/CHLAMYDIA PROBE AMP (~~LOC~~) NOT AT ARMC
Chlamydia: NEGATIVE
Neisseria Gonorrhea: NEGATIVE

## 2015-06-20 ENCOUNTER — Inpatient Hospital Stay (HOSPITAL_COMMUNITY)
Admission: AD | Admit: 2015-06-20 | Discharge: 2015-06-21 | Disposition: A | Discharge status: Home / Self Care | Payer: Medicaid Other | Source: Ambulatory Visit | Attending: Obstetrics and Gynecology | Admitting: Obstetrics and Gynecology

## 2015-06-20 DIAGNOSIS — A084 Viral intestinal infection, unspecified: Secondary | ICD-10-CM

## 2015-06-20 DIAGNOSIS — O26899 Other specified pregnancy related conditions, unspecified trimester: Secondary | ICD-10-CM

## 2015-06-20 DIAGNOSIS — R109 Unspecified abdominal pain: Secondary | ICD-10-CM | POA: Insufficient documentation

## 2015-06-20 DIAGNOSIS — R197 Diarrhea, unspecified: Secondary | ICD-10-CM | POA: Insufficient documentation

## 2015-06-20 DIAGNOSIS — Z3A22 22 weeks gestation of pregnancy: Secondary | ICD-10-CM | POA: Insufficient documentation

## 2015-06-20 DIAGNOSIS — O218 Other vomiting complicating pregnancy: Secondary | ICD-10-CM | POA: Insufficient documentation

## 2015-06-20 DIAGNOSIS — O219 Vomiting of pregnancy, unspecified: Secondary | ICD-10-CM

## 2015-06-21 ENCOUNTER — Encounter (HOSPITAL_COMMUNITY): Payer: Self-pay

## 2015-06-21 ENCOUNTER — Inpatient Hospital Stay (HOSPITAL_COMMUNITY): Payer: Medicaid Other

## 2015-06-21 DIAGNOSIS — A084 Viral intestinal infection, unspecified: Secondary | ICD-10-CM | POA: Diagnosis not present

## 2015-06-21 DIAGNOSIS — R197 Diarrhea, unspecified: Secondary | ICD-10-CM | POA: Diagnosis not present

## 2015-06-21 DIAGNOSIS — O218 Other vomiting complicating pregnancy: Secondary | ICD-10-CM | POA: Diagnosis not present

## 2015-06-21 DIAGNOSIS — Z3A22 22 weeks gestation of pregnancy: Secondary | ICD-10-CM | POA: Diagnosis not present

## 2015-06-21 DIAGNOSIS — R109 Unspecified abdominal pain: Secondary | ICD-10-CM | POA: Diagnosis present

## 2015-06-21 LAB — CBC
HCT: 38.7 % (ref 36.0–46.0)
HEMOGLOBIN: 12.9 g/dL (ref 12.0–15.0)
MCH: 27.3 pg (ref 26.0–34.0)
MCHC: 33.3 g/dL (ref 30.0–36.0)
MCV: 82 fL (ref 78.0–100.0)
Platelets: 246 10*3/uL (ref 150–400)
RBC: 4.72 MIL/uL (ref 3.87–5.11)
RDW: 14.4 % (ref 11.5–15.5)
WBC: 12.9 10*3/uL — ABNORMAL HIGH (ref 4.0–10.5)

## 2015-06-21 LAB — COMPREHENSIVE METABOLIC PANEL
ALK PHOS: 72 U/L (ref 38–126)
ALT: 21 U/L (ref 14–54)
ANION GAP: 5 (ref 5–15)
AST: 20 U/L (ref 15–41)
Albumin: 3.3 g/dL — ABNORMAL LOW (ref 3.5–5.0)
BUN: 9 mg/dL (ref 6–20)
CO2: 23 mmol/L (ref 22–32)
Calcium: 9.1 mg/dL (ref 8.9–10.3)
Chloride: 106 mmol/L (ref 101–111)
Creatinine, Ser: 0.59 mg/dL (ref 0.44–1.00)
GFR calc Af Amer: >60 mL/min (ref >60)
GLUCOSE: 108 mg/dL — AB (ref 65–99)
POTASSIUM: 3.9 mmol/L (ref 3.5–5.1)
Sodium: 134 mmol/L — ABNORMAL LOW (ref 135–145)
Total Bilirubin: 0.5 mg/dL (ref 0.3–1.2)
Total Protein: 6.7 g/dL (ref 6.5–8.1)

## 2015-06-21 LAB — URINALYSIS, ROUTINE W REFLEX MICROSCOPIC
BILIRUBIN URINE: NEGATIVE
Glucose, UA: NEGATIVE mg/dL
Ketones, ur: NEGATIVE mg/dL
Nitrite: NEGATIVE
Protein, ur: NEGATIVE mg/dL
SPECIFIC GRAVITY, URINE: 1.025 (ref 1.005–1.030)
UROBILINOGEN UA: 0.2 mg/dL (ref 0.0–1.0)
pH: 5.5 (ref 5.0–8.0)

## 2015-06-21 LAB — URINE MICROSCOPIC-ADD ON

## 2015-06-21 IMAGING — US US ABDOMEN LIMITED
1 series · 14 of 25 positions shown · non-contrast
Comparison: None.

CLINICAL DATA: Upper abdominal pain with nausea, vomiting and
diarrhea for 2 weeks. Patient is 22 weeks pregnant.

EXAM:
US ABDOMEN LIMITED - RIGHT UPPER QUADRANT

[Series 1: us abdomen limited · 14 of 37 slices shown]
[im 1/37]
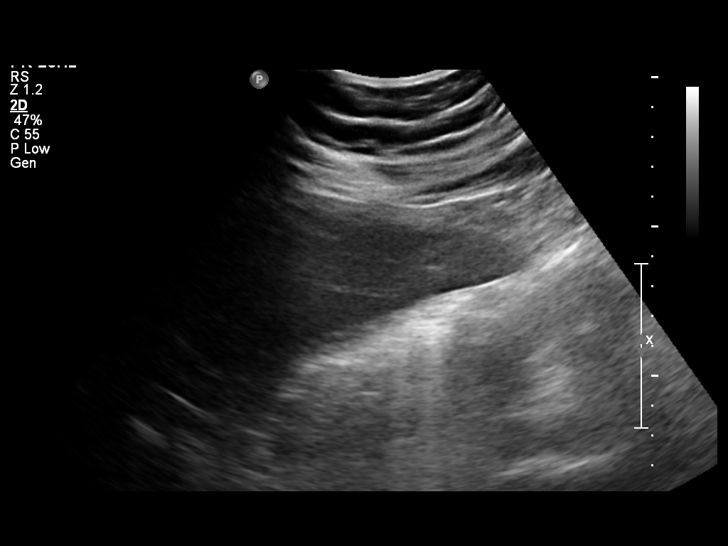
[im 4/37]
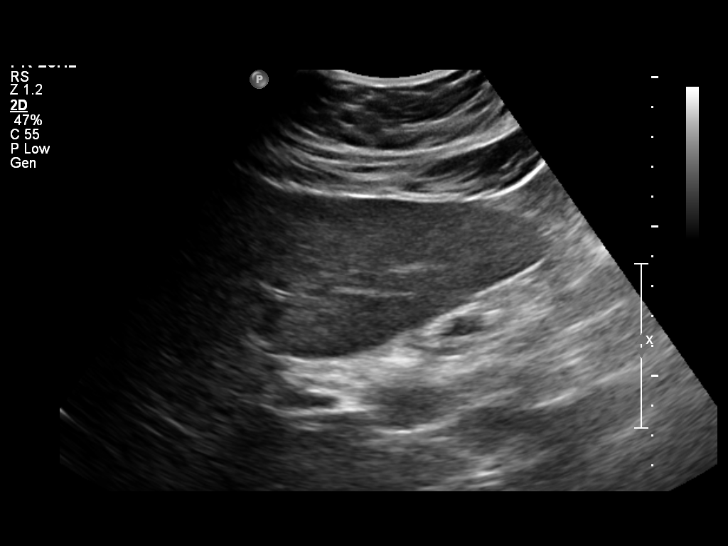
[im 7/37]
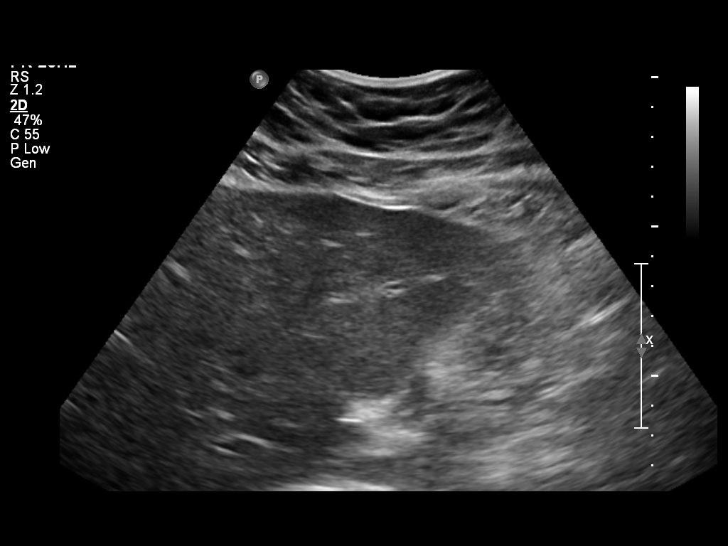
[im 10/37]
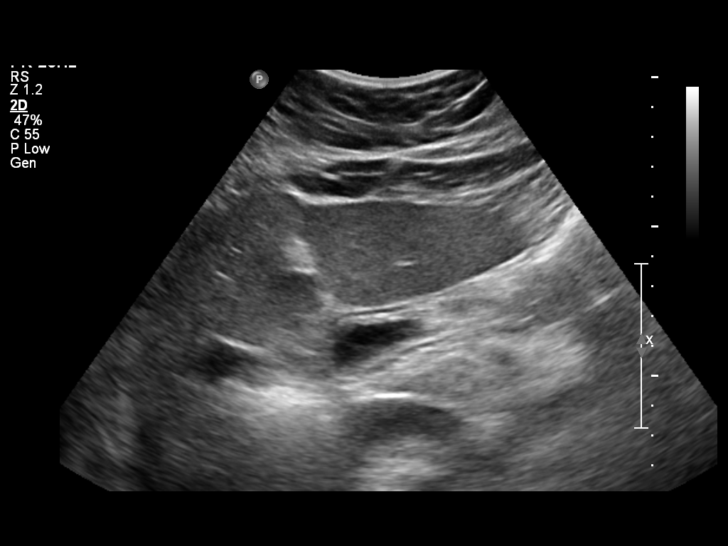
[im 13/37]
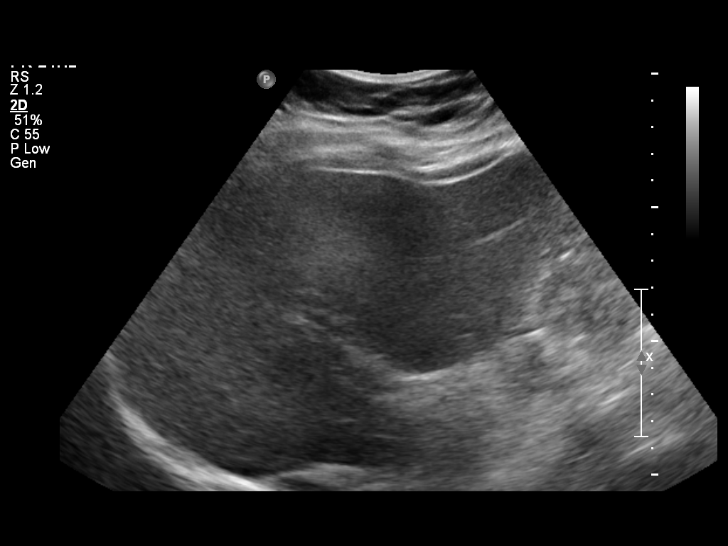
[im 14/37]
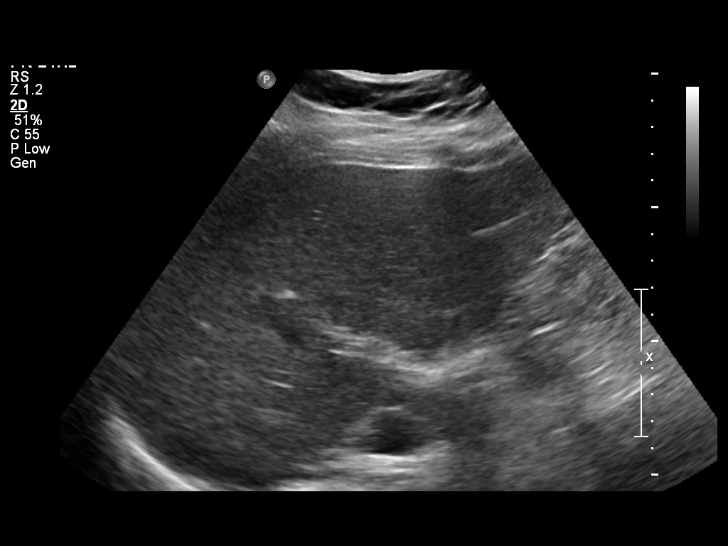
[im 17/37]
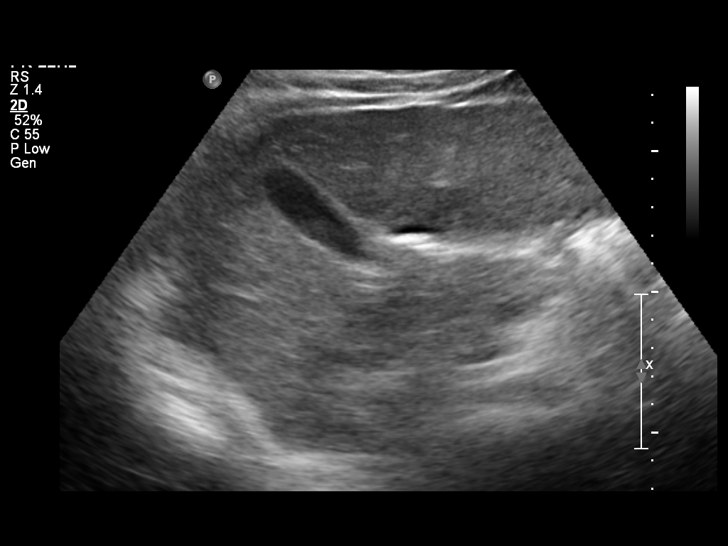
[im 20/37]
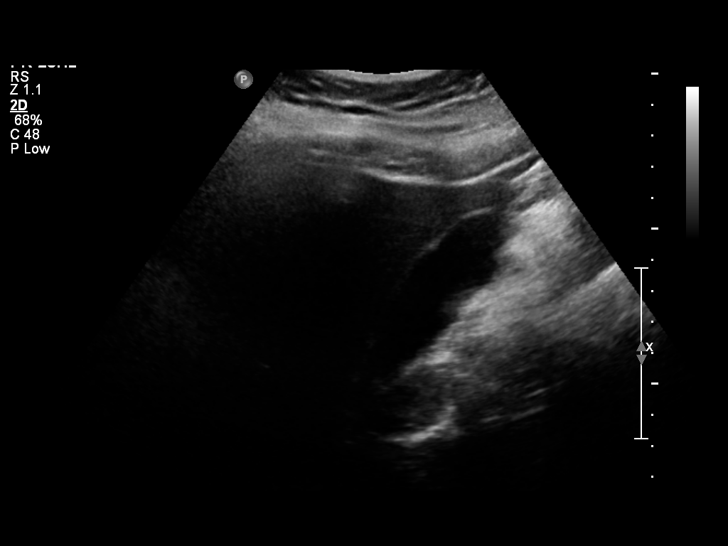
[im 23/37]
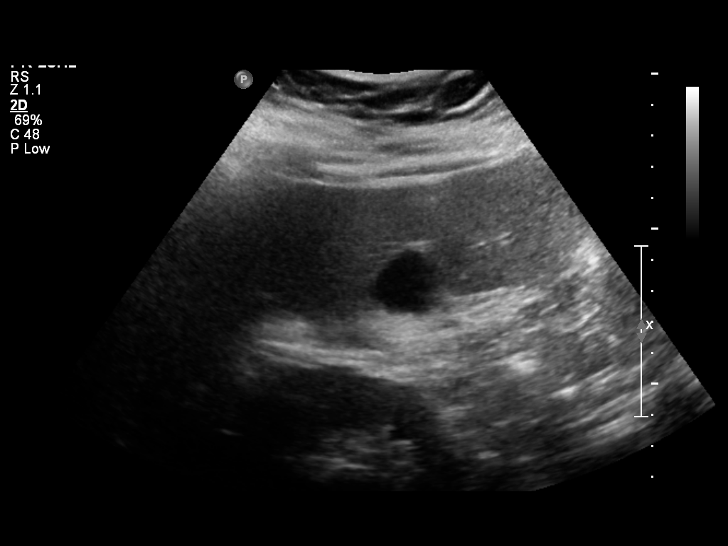
[im 25/37]
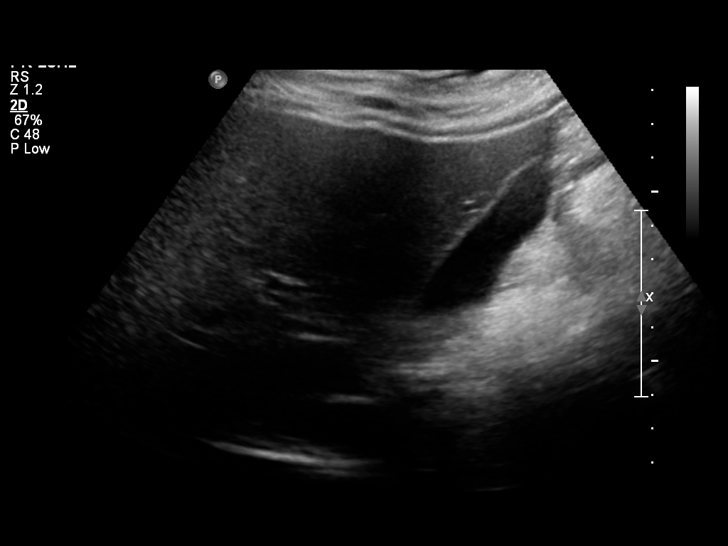
[im 28/37]
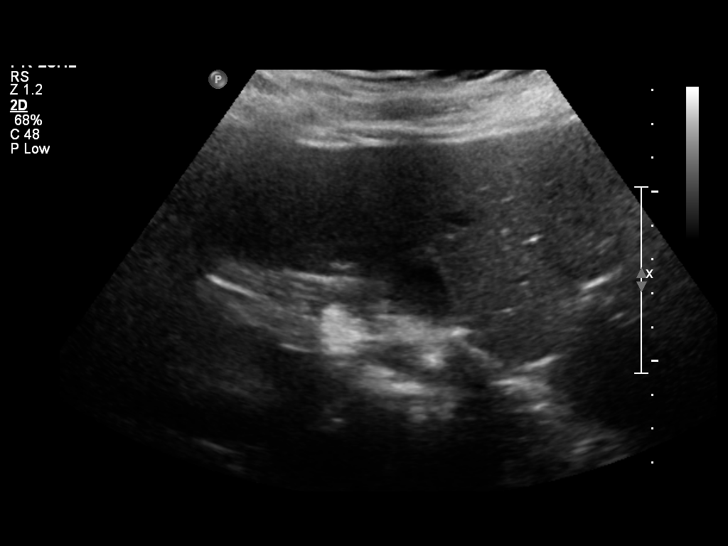
[im 31/37]
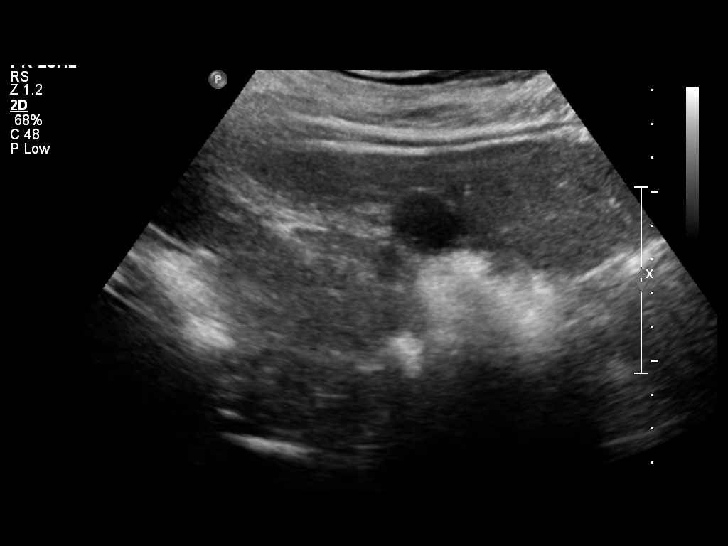
[im 34/37]
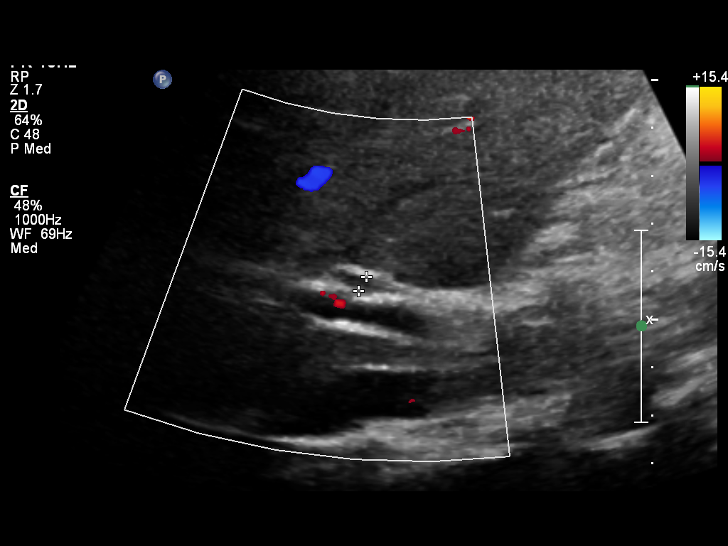
[im 37/37]
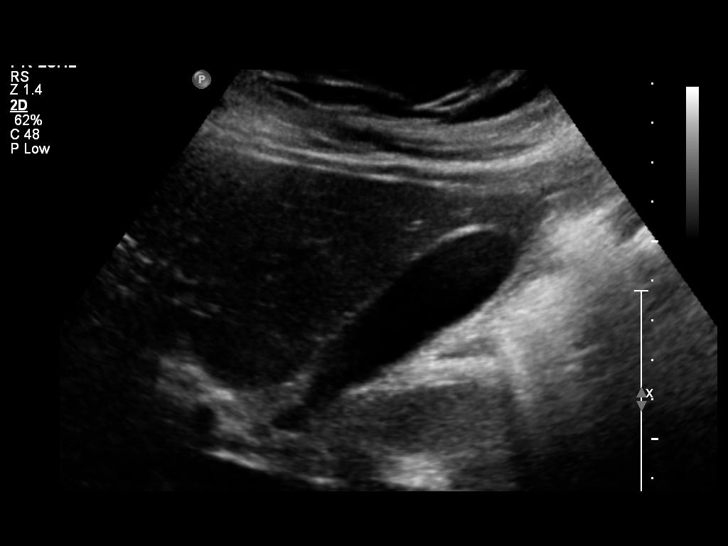

[14 of 25 positions shown; findings below may reference images not displayed]

FINDINGS: Gallbladder:

Physiologically distended. No gallstones or wall thickening
visualized. No sonographic Murphy sign noted.

Common bile duct:

Diameter: 3.4 mm.

Liver:

No focal lesion identified. Within normal limits in parenchymal
echogenicity.
IMPRESSION: Normal right upper quadrant ultrasound.

## 2015-06-21 MED ORDER — ONDANSETRON 8 MG PO TBDP
8.0000 mg | ORAL_TABLET | Freq: Once | ORAL | Status: AC
  Administered 2015-06-21: 8 mg via ORAL
  Filled 2015-06-21: qty 1

## 2015-06-21 MED ORDER — GI COCKTAIL ~~LOC~~
30.0000 mL | Freq: Once | ORAL | Status: AC
  Administered 2015-06-21: 30 mL via ORAL
  Filled 2015-06-21: qty 30

## 2015-06-21 NOTE — MAU Provider Note (Signed)
History     CSN: [id number]  Arrival date and time: 06/20/15 2357   First Provider Initiated Contact with Patient 06/21/15 [phone]      Chief Complaint  Patient presents with  . Abdominal Pain   HPI Ms. RALONDA TARTT is a 24 y.o. G1P0 at [redacted]w[redacted]d here with report of upper abdominal pain for > 1 week. 5621 Assumed care of patient from [facility] staff.Pain increases with palpation of area.  Upon review of the chart patient was seen in MAU on 06/17/15 for soame symptoms.  Pt was diagnosed with trichomoniasis and treated with prescribed PO flagyl.  Reports abdominal pain has increased and vomiting has continued.  Vomited x 2 in past 24 hours.  Reports having > 10 loose stools in past 24 hours.  Denies blood in stool.  No additional antibiotics taken per patient.  No recent sick exposure.     Past Medical History  Diagnosis Date  . Mental disorder   . Depression   . Sleep apnea   . PID (pelvic inflammatory disease)   . Gonorrhea   . Chlamydia     Past Surgical History  Procedure Laterality Date  . Knee surgery      History reviewed. No pertinent family history.  History  Substance Use Topics  . Smoking status: Never Smoker   . Smokeless tobacco: Never Used  . Alcohol Use: No    Allergies: No Known Allergies  Prescriptions prior to admission  Medication Sig Dispense Refill Last Dose  . omeprazole (PRILOSEC) 20 MG capsule Take 1 capsule (20 mg total) by mouth daily. (Patient taking differently: Take 20 mg by mouth daily as needed. ) 30 capsule 8 Past Week at Unknown time  . ondansetron (ZOFRAN ODT) 8 MG disintegrating tablet Take 1 tablet (8 mg total) by mouth every 8 (eight) hours as needed for nausea or vomiting. 20 tablet 0 Past Month at Unknown time  . Prenatal Multivit-Min-Fe-FA (PRENATAL VITAMINS) 0.8 MG tablet Take 1 tablet by mouth daily. 30 tablet 12 06/20/2015 at Unknown time  . promethazine (PHENERGAN) 25 MG tablet Take 1 tablet (25 mg total) by mouth every 6 (six) hours as  needed for nausea or vomiting. 30 tablet 1 Past Month at Unknown time    Review of Systems  Constitutional: Positive for fever and chills.  Gastrointestinal: Positive for nausea, vomiting, abdominal pain and diarrhea. Negative for constipation and blood in stool.  Genitourinary: Negative for dysuria, urgency and frequency.  Musculoskeletal: Positive for myalgias.  Neurological: Positive for headaches.  All other systems reviewed and are negative.  Physical Exam   Blood pressure 115/75, pulse 95, temperature 99.5 F (37.5 C), temperature source Oral, resp. rate 20, height 5\' 1"  (1.549 m), weight 257 lb (116.574 kg), last menstrual period 12/28/2014, SpO2 98 %.  Physical Exam  Constitutional: She is oriented to person, place, and time. She appears well-developed and well-nourished.  Appears uncomfortable; crying  HENT:  Head: Normocephalic.  Neck: Normal range of motion. Neck supple.  Cardiovascular: Normal rate, regular rhythm and normal heart sounds.   Respiratory: Effort normal and breath sounds normal.  GI: Soft. There is tenderness. There is no guarding.  Genitourinary: No bleeding in the vagina.  Neurological: She is alert and oriented to person, place, and time.  Skin: Skin is warm and dry.    MAU Course  Procedures 0750 Assumed care of patient from [facility] staff. Pt placed on enteric precautions.  C diff ordered.    Results for orders placed  or performed during the hospital encounter of 06/20/15 (from the past 24 hour(s))  Urinalysis, Routine w reflex microscopic (not at [facility])     Status: Abnormal   Collection Time: 06/21/15 12:05 AM  Result Value Ref Range   Color, Urine YELLOW YELLOW   APPearance CLEAR CLEAR   Specific Gravity, Urine 1.025 1.005 - 1.030   pH 5.5 5.0 - 8.0   Glucose, UA NEGATIVE NEGATIVE mg/dL   Hgb urine dipstick TRACE (A) NEGATIVE   Bilirubin Urine NEGATIVE NEGATIVE   Ketones, ur NEGATIVE NEGATIVE mg/dL   Protein, ur NEGATIVE NEGATIVE mg/dL    Urobilinogen, UA 0.2 0.0 - 1.0 mg/dL   Nitrite NEGATIVE NEGATIVE   Leukocytes, UA TRACE (A) NEGATIVE  Urine microscopic-add on     Status: Abnormal   Collection Time: 06/21/15 12:05 AM  Result Value Ref Range   Squamous Epithelial / LPF FEW (A) RARE   WBC, UA 0-2 <3 WBC/hpf   RBC / HPF 0-2 <3 RBC/hpf   Bacteria, UA RARE RARE  CBC     Status: Abnormal   Collection Time: 06/21/15  1:25 AM  Result Value Ref Range   WBC 12.9 (H) 4.0 - 10.5 K/uL   RBC 4.72 3.87 - 5.11 MIL/uL   Hemoglobin 12.9 12.0 - 15.0 g/dL   HCT 82.9 56.2 - 13.0 %   MCV 82.0 78.0 - 100.0 fL   MCH 27.3 26.0 - 34.0 pg   MCHC 33.3 30.0 - 36.0 g/dL   RDW 86.5 78.4 - 69.6 %   Platelets 246 150 - 400 K/uL  Comprehensive metabolic panel     Status: Abnormal   Collection Time: 06/21/15  1:25 AM  Result Value Ref Range   Sodium 134 (L) 135 - 145 mmol/L   Potassium 3.9 3.5 - 5.1 mmol/L   Chloride 106 101 - 111 mmol/L   CO2 23 22 - 32 mmol/L   Glucose, Bld 108 (H) 65 - 99 mg/dL   BUN 9 6 - 20 mg/dL   Creatinine, Ser 2.95 0.44 - 1.00 mg/dL   Calcium 9.1 8.9 - 28.4 mg/dL   Total Protein 6.7 6.5 - 8.1 g/dL   Albumin 3.3 (L) 3.5 - 5.0 g/dL   AST 20 15 - 41 U/L   ALT 21 14 - 54 U/L   Alkaline Phosphatase 72 38 - 126 U/L   Total Bilirubin 0.5 0.3 - 1.2 mg/dL   GFR calc non Af Amer >60 >60 mL/min   GFR calc Af Amer >60 >60 mL/min   Anion gap 5 5 - 15   Upper abdominal ultrasound: Normal right upper quadrant ultrasound.  0830 Report given to L. Leftwich-Kirby who assumes care of patient.  Eino Farber Kennith Gain, CNM  Assessment and Plan

## 2015-06-21 NOTE — Discharge Instructions (Signed)
Viral Gastroenteritis °Viral gastroenteritis is also known as stomach flu. This condition affects the stomach and intestinal tract. It can cause sudden diarrhea and vomiting. The illness typically lasts 3 to 8 days. Most people develop an immune response that eventually gets rid of the virus. While this natural response develops, the virus can make you quite ill. °CAUSES  °Many different viruses can cause gastroenteritis, such as rotavirus or noroviruses. You can catch one of these viruses by consuming contaminated food or water. You may also catch a virus by sharing utensils or other personal items with an infected person or by touching a contaminated surface. °SYMPTOMS  °The most common symptoms are diarrhea and vomiting. These problems can cause a severe loss of body fluids (dehydration) and a body salt (electrolyte) imbalance. Other symptoms may include: °· Fever. °· Headache. °· Fatigue. °· Abdominal pain. °DIAGNOSIS  °Your caregiver can usually diagnose viral gastroenteritis based on your symptoms and a physical exam. A stool sample may also be taken to test for the presence of viruses or other infections. °TREATMENT  °This illness typically goes away on its own. Treatments are aimed at rehydration. The most serious cases of viral gastroenteritis involve vomiting so severely that you are not able to keep fluids down. In these cases, fluids must be given through an intravenous line (IV). °HOME CARE INSTRUCTIONS  °· Drink enough fluids to keep your urine clear or pale yellow. Drink small amounts of fluids frequently and increase the amounts as tolerated. °· Ask your caregiver for specific rehydration instructions. °· Avoid: °· Foods high in sugar. °· Alcohol. °· Carbonated drinks. °· Tobacco. °· Juice. °· Caffeine drinks. °· Extremely hot or cold fluids. °· Fatty, greasy foods. °· Too much intake of anything at one time. °· Dairy products until 24 to 48 hours after diarrhea stops. °· You may consume probiotics.  Probiotics are active cultures of beneficial bacteria. They may lessen the amount and number of diarrheal stools in adults. Probiotics can be found in yogurt with active cultures and in supplements. °· Wash your hands well to avoid spreading the virus. °· Only take over-the-counter or prescription medicines for pain, discomfort, or fever as directed by your caregiver. Do not give aspirin to children. Antidiarrheal medicines are not recommended. °· Ask your caregiver if you should continue to take your regular prescribed and over-the-counter medicines. °· Keep all follow-up appointments as directed by your caregiver. °SEEK IMMEDIATE MEDICAL CARE IF:  °· You are unable to keep fluids down. °· You do not urinate at least once every 6 to 8 hours. °· You develop shortness of breath. °· You notice blood in your stool or vomit. This may look like coffee grounds. °· You have abdominal pain that increases or is concentrated in one small area (localized). °· You have persistent vomiting or diarrhea. °· You have a fever. °· The patient is a child younger than 3 months, and he or she has a fever. °· The patient is a child older than 3 months, and he or she has a fever and persistent symptoms. °· The patient is a child older than 3 months, and he or she has a fever and symptoms suddenly get worse. °· The patient is a baby, and he or she has no tears when crying. °MAKE SURE YOU:  °· Understand these instructions. °· Will watch your condition. °· Will get help right away if you are not doing well or get worse. °Document Released: 11/13/2005 Document Revised: 02/05/2012 Document Reviewed: 08/30/2011 °  [employer] Patient Information 2015 [place], [place]. This information is not intended to replace advice given to you by your health care provider. Make sure you discuss any questions you have with your health care provider.  Gastroesophageal Reflux, Adult Gastroesophageal reflux disease (GERD) happens when acid from your stomach flows  up into the esophagus. When acid comes in contact with the esophagus, the acid causes soreness (inflammation) in the esophagus. Over time, GERD may create small holes (ulcers) in the lining of the esophagus. CAUSES   Increased body weight. This puts pressure on the stomach, making acid rise from the stomach into the esophagus.  Smoking. This increases acid production in the stomach.  Drinking alcohol. This causes decreased pressure in the lower esophageal sphincter (valve or ring of muscle between the esophagus and stomach), allowing acid from the stomach into the esophagus.  Late evening meals and a full stomach. This increases pressure and acid production in the stomach.  A malformed lower esophageal sphincter. Sometimes, no cause is found. SYMPTOMS   Burning pain in the lower part of the mid-chest behind the breastbone and in the mid-stomach area. This may occur twice a week or more often.  Trouble swallowing.  Sore throat.  Dry cough.  Asthma-like symptoms including chest tightness, shortness of breath, or wheezing. DIAGNOSIS  Your caregiver may be able to diagnose GERD based on your symptoms. In some cases, X-rays and other tests may be done to check for complications or to check the condition of your stomach and esophagus. TREATMENT  Your caregiver may recommend over-the-counter or prescription medicines to help decrease acid production. Ask your caregiver before starting or adding any new medicines.  HOME CARE INSTRUCTIONS   Change the factors that you can control. Ask your caregiver for guidance concerning weight loss, quitting smoking, and alcohol consumption.  Avoid foods and drinks that make your symptoms worse, such as:  Caffeine or alcoholic drinks.  Chocolate.  Peppermint or mint flavorings.  Garlic and onions.  Spicy foods.  Citrus fruits, such as oranges, lemons, or limes.  Tomato-based foods such as sauce, chili, salsa, and pizza.  Fried and fatty  foods.  Avoid lying down for the 3 hours prior to your bedtime or prior to taking a nap.  Eat small, frequent meals instead of large meals.  Wear loose-fitting clothing. Do not wear anything tight around your waist that causes pressure on your stomach.  Raise the head of your bed 6 to 8 inches with wood blocks to help you sleep. Extra pillows will not help.  Only take over-the-counter or prescription medicines for pain, discomfort, or fever as directed by your caregiver.  Do not take aspirin, ibuprofen, or other nonsteroidal anti-inflammatory drugs (NSAIDs). SEEK IMMEDIATE MEDICAL CARE IF:   You have pain in your arms, neck, jaw, teeth, or back.  Your pain increases or changes in intensity or duration.  You develop nausea, vomiting, or sweating (diaphoresis).  You develop shortness of breath, or you faint.  Your vomit is green, yellow, black, or looks like coffee grounds or blood.  Your stool is red, bloody, or black. These symptoms could be signs of other problems, such as heart disease, gastric bleeding, or esophageal bleeding. MAKE SURE YOU:   Understand these instructions.  Will watch your condition.  Will get help right away if you are not doing well or get worse. Document Released: 08/23/2005 Document Revised: 02/05/2012 Document Reviewed: 06/02/2011 [facility] Patient Information 2015 [place], [place]. This information is not intended to replace advice given  to you by your health care provider. Make sure you discuss any questions you have with your health care provider.

## 2015-06-21 NOTE — MAU Provider Note (Signed)
History     CSN: [id number]  Arrival date and time: 06/20/15 2357   First Provider Initiated Contact with Patient 06/21/15 [phone]      Chief Complaint  Patient presents with  . Abdominal Pain   HPI   Alexandra Gray is 23 y.o. female G1P0 at [redacted]w[redacted]d gestation by ultrasound presenting to the MAU today with > 1 week long history of abdominal pain. She came to the MAU Thursday 06/17/2015 with abdominal pain and diarrhea x 1 week. Was given a GI cocktail in the MAU, which improved symptoms. Was also determined to have Trich and prescribed Flagyl. After being discharged from the MAU, abdominal pain and diarrhea returned and worsened in intensity.   Abdominal pain is worse in epigastrium, but involves whole abdomen and refers to back and sometimes down upper thighs. Described as 10/10 sharp pain that occurs intermittently. Vomited 2x in the last 24 hours before arrival to MAU and vomited twice since arriving to MAU.  Unrelieved by Tylenol, hot shower, and hot pad. Has tried no other medications, but has zofran and phenergan at home. Still taking Flagyl. No recent alcohol intake. Pain worsened by moving too fast, when touching abdomen, and with bowel movements. Pain unimproved after having a bowel movement. No recent travel. No sick contacts.   Diarrhea for one week. Having >10 loose stools per day. Has tried no medications. Eating makes it worse. Nothing makes it better. No blood or mucous in the stool. Not overly malodorous.   PO intake has been reduced due to abdominal pain and vomiting. Last attempted eating at dinner time, but could only eat a few bites. Last fluid intake about 1 pm. Has not taken temperature at home, but has "felt hot a lot." No VB, vaginal discharge, dysuria, polyuria.   So far, pregnancy has been uncomplicated. Seen at [facility].   +FHT on doppler.   OB History    Gravida Para Term Preterm AB TAB SAB Ectopic Multiple Living   1               Past  Medical History  Diagnosis Date  . Mental disorder   . Depression   . Sleep apnea   . PID (pelvic inflammatory disease)   . Gonorrhea   . Chlamydia     Past Surgical History  Procedure Laterality Date  . Knee surgery      History reviewed. No pertinent family history.  History  Substance Use Topics  . Smoking status: Never Smoker   . Smokeless tobacco: Never Used  . Alcohol Use: No    Allergies: No Known Allergies  Prescriptions prior to admission  Medication Sig Dispense Refill Last Dose  . omeprazole (PRILOSEC) 20 MG capsule Take 1 capsule (20 mg total) by mouth daily. (Patient taking differently: Take 20 mg by mouth daily as needed. ) 30 capsule 8 Past Week at Unknown time  . ondansetron (ZOFRAN ODT) 8 MG disintegrating tablet Take 1 tablet (8 mg total) by mouth every 8 (eight) hours as needed for nausea or vomiting. 20 tablet 0 Past Month at Unknown time  . Prenatal Multivit-Min-Fe-FA (PRENATAL VITAMINS) 0.8 MG tablet Take 1 tablet by mouth daily. 30 tablet 12 06/20/2015 at Unknown time  . promethazine (PHENERGAN) 25 MG tablet Take 1 tablet (25 mg total) by mouth every 6 (six) hours as needed for nausea or vomiting. 30 tablet 1 Past Month at Unknown time    Review of Systems  Constitutional: Positive for  weight loss and diaphoresis. Negative for fever and chills.       Feeling hot a lot  HENT: Negative for sore throat.   Eyes: Positive for blurred vision (for one week).  Respiratory: Negative for cough and shortness of breath.   Cardiovascular: Negative for chest pain, palpitations and leg swelling.  Gastrointestinal: Positive for nausea, vomiting, abdominal pain and diarrhea. Negative for heartburn, constipation and blood in stool.  Genitourinary: Negative for dysuria, urgency, frequency and hematuria.       Urine has been a little darker recently  Musculoskeletal: Positive for back pain.  Skin: Positive for rash (a little bit on the chest for two days).   Neurological: Positive for dizziness and headaches (for a couple days). Negative for loss of consciousness.  Psychiatric/Behavioral:       "Sometimes I feel irritated"   Physical Exam   Blood pressure 113/66, pulse 79, temperature 98.5 F (36.9 C), temperature source Oral, resp. rate 20, height 5\' 1"  (1.549 m), weight 116.574 kg (257 lb), last menstrual period 12/28/2014, SpO2 98 %.  Physical Exam  Constitutional: She is oriented to person, place, and time. She appears well-developed and well-nourished. She appears distressed.  HENT:  Head: Normocephalic and atraumatic.  Eyes: No scleral icterus.  Conjunctiva red- has been crying  Neck: Neck supple.  Cardiovascular: Normal rate, regular rhythm and normal heart sounds.   Respiratory: Effort normal and breath sounds normal.  GI: Soft. Bowel sounds are normal. She exhibits no distension and no mass. There is tenderness (whole abdomen, worse epigastric). There is no rebound and no guarding.  Musculoskeletal: She exhibits no edema.  Lymphadenopathy:    She has no cervical adenopathy.  Neurological: She is alert and oriented to person, place, and time.  Skin: Skin is warm and dry. No rash noted. She is not diaphoretic. No erythema. No pallor.  Psychiatric:  Recently crying   Results for orders placed or performed during the hospital encounter of 06/20/15 (from the past 24 hour(s))  Urinalysis, Routine w reflex microscopic (not at [facility])     Status: Abnormal   Collection Time: 06/21/15 12:05 AM  Result Value Ref Range   Color, Urine YELLOW YELLOW   APPearance CLEAR CLEAR   Specific Gravity, Urine 1.025 1.005 - 1.030   pH 5.5 5.0 - 8.0   Glucose, UA NEGATIVE NEGATIVE mg/dL   Hgb urine dipstick TRACE (A) NEGATIVE   Bilirubin Urine NEGATIVE NEGATIVE   Ketones, ur NEGATIVE NEGATIVE mg/dL   Protein, ur NEGATIVE NEGATIVE mg/dL   Urobilinogen, UA 0.2 0.0 - 1.0 mg/dL   Nitrite NEGATIVE NEGATIVE   Leukocytes, UA TRACE (A) NEGATIVE  Urine  microscopic-add on     Status: Abnormal   Collection Time: 06/21/15 12:05 AM  Result Value Ref Range   Squamous Epithelial / LPF FEW (A) RARE   WBC, UA 0-2 <3 WBC/hpf   RBC / HPF 0-2 <3 RBC/hpf   Bacteria, UA RARE RARE  CBC     Status: Abnormal   Collection Time: 06/21/15  1:25 AM  Result Value Ref Range   WBC 12.9 (H) 4.0 - 10.5 K/uL   RBC 4.72 3.87 - 5.11 MIL/uL   Hemoglobin 12.9 12.0 - 15.0 g/dL   HCT 19.1 47.8 - 29.5 %   MCV 82.0 78.0 - 100.0 fL   MCH 27.3 26.0 - 34.0 pg   MCHC 33.3 30.0 - 36.0 g/dL   RDW 62.1 30.8 - 65.7 %   Platelets 246 150 - 400 K/uL  Comprehensive metabolic panel     Status: Abnormal   Collection Time: 06/21/15  1:25 AM  Result Value Ref Range   Sodium 134 (L) 135 - 145 mmol/L   Potassium 3.9 3.5 - 5.1 mmol/L   Chloride 106 101 - 111 mmol/L   CO2 23 22 - 32 mmol/L   Glucose, Bld 108 (H) 65 - 99 mg/dL   BUN 9 6 - 20 mg/dL   Creatinine, Ser 2.13 0.44 - 1.00 mg/dL   Calcium 9.1 8.9 - 08.6 mg/dL   Total Protein 6.7 6.5 - 8.1 g/dL   Albumin 3.3 (L) 3.5 - 5.0 g/dL   AST 20 15 - 41 U/L   ALT 21 14 - 54 U/L   Alkaline Phosphatase 72 38 - 126 U/L   Total Bilirubin 0.5 0.3 - 1.2 mg/dL   GFR calc non Af Amer >60 >60 mL/min   GFR calc Af Amer >60 >60 mL/min   Anion gap 5 5 - 15    MAU Course  Procedures None  MDM +FHT on doppler UA: normal except trace hgb dipstick and trace leukocytes. Few squamous epithelial cells on microscopy.  CBC: WBC 12.9 (was 11.8 on 06/17/2015) CMP: normal except Na 134, Glucose 108, Albumin 3.3 Zofran SL GI Cocktail RUQ Limited US: normal   Assessment and Plan  A: 24 y.o. female G1P0 at [redacted]w[redacted]d gestation 1. Abdominal pain in pregnancy, second trimester 2. Vomiting 3. Diarrhea  P:  1. Abdominal pain improved from 10/10 pain to 7/10 pain with Zofran and GI cocktail. Slightly elevated WBC and temp 99.5 indicate possible infection. RUQ [place] normal. Possible viral etiology. Supportive care including clear liquids, BRAT diet,  and rest. Can take Tylenol for pain and for fever.  2. Vomiting resolved with Zofran and GI cocktail. CBC stable. [place] RUQ normal. Possible viral etiology. Supportive care as stated above. Can take Rx GI medicines at home (prilosec, zofran, phenergan) for heartburn, N/V.  3. No diarrhea during stay in MAU. Send home supplies to collect stool sample for culture to r/o c.diff or other infectious etiology. Supportive care as stated above.    Arliss Journey 06/21/2015, 8:51 AM   I have seen this patient and agree with the above student's note.  See my assessment and note of the same date.  LEFTWICH-KIRBY, Garnetta Fedrick Certified Nurse-Midwife

## 2015-06-21 NOTE — MAU Note (Signed)
Pt presents with complaint of continued lower abd pain. States she was seen here on Friday but the pain has been ongoing for a week. States she is having a lot of cramping and is having 3-4 watery stools per day. Denies bleeding.

## 2015-06-23 ENCOUNTER — Other Ambulatory Visit (HOSPITAL_COMMUNITY): Payer: Self-pay | Admitting: Nurse Practitioner

## 2015-06-23 DIAGNOSIS — IMO0002 Reserved for concepts with insufficient information to code with codable children: Secondary | ICD-10-CM

## 2015-06-23 DIAGNOSIS — Z0489 Encounter for examination and observation for other specified reasons: Secondary | ICD-10-CM

## 2015-06-25 ENCOUNTER — Ambulatory Visit (HOSPITAL_COMMUNITY)
Admission: RE | Admit: 2015-06-25 | Discharge: 2015-06-25 | Disposition: A | Discharge status: Home / Self Care | Payer: Medicaid Other | Source: Ambulatory Visit | Attending: Nurse Practitioner | Admitting: Nurse Practitioner

## 2015-06-25 DIAGNOSIS — IMO0002 Reserved for concepts with insufficient information to code with codable children: Secondary | ICD-10-CM

## 2015-06-25 DIAGNOSIS — Z3A23 23 weeks gestation of pregnancy: Secondary | ICD-10-CM | POA: Insufficient documentation

## 2015-06-25 DIAGNOSIS — Z36 Encounter for antenatal screening of mother: Secondary | ICD-10-CM | POA: Insufficient documentation

## 2015-06-25 DIAGNOSIS — O99210 Obesity complicating pregnancy, unspecified trimester: Secondary | ICD-10-CM | POA: Insufficient documentation

## 2015-06-25 DIAGNOSIS — Z0489 Encounter for examination and observation for other specified reasons: Secondary | ICD-10-CM | POA: Insufficient documentation

## 2015-06-25 IMAGING — US US OB FOLLOW-UP
1 series · 12 of 28 positions shown · non-contrast
Comparison: none

[Series 1: us ob follow up · 134 acquisitions, 12 frames shown]
[im 5/134]
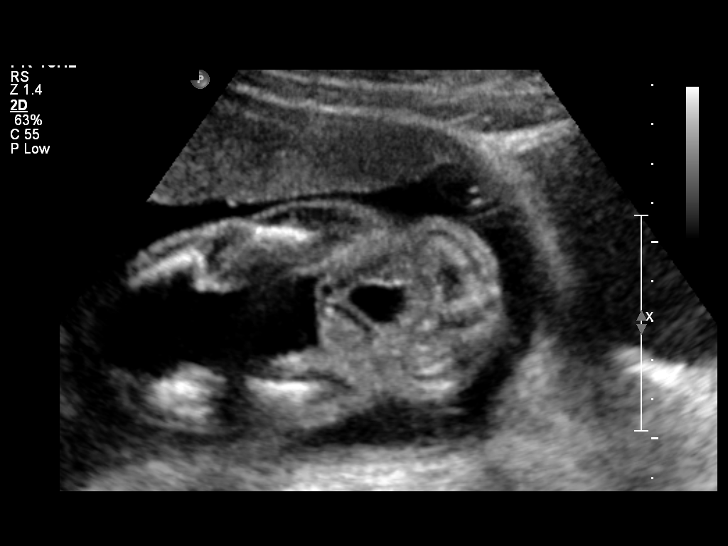
[im 15/134]
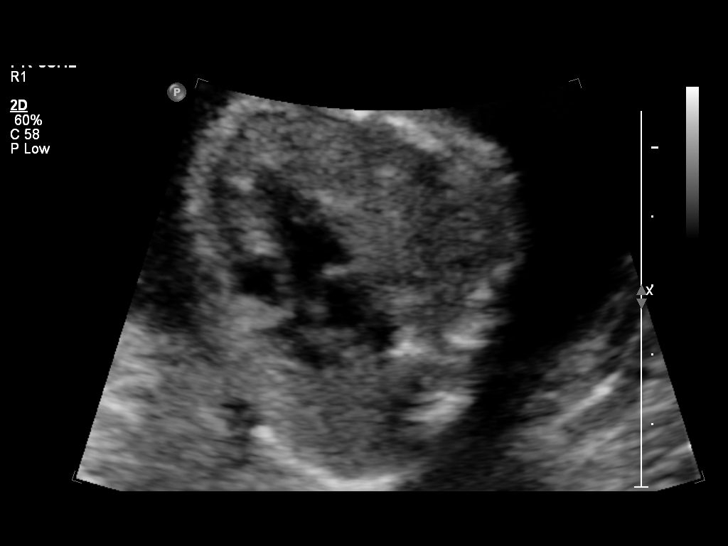
[im 25/134]
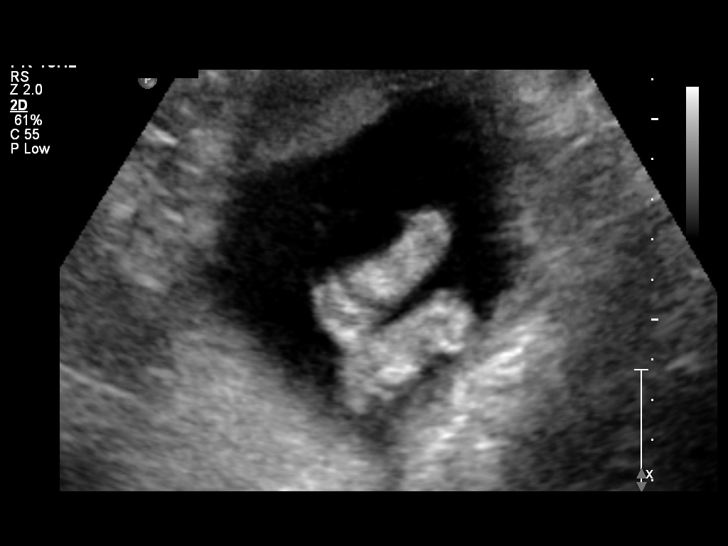
[im 40/134]
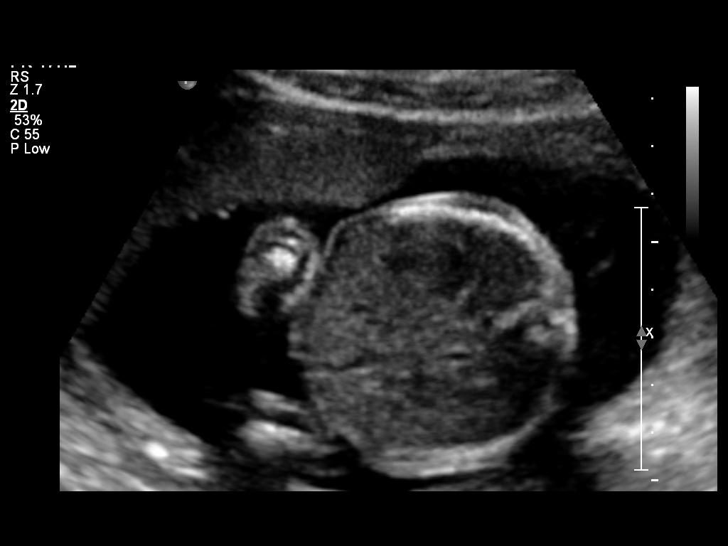
[im 50/134]
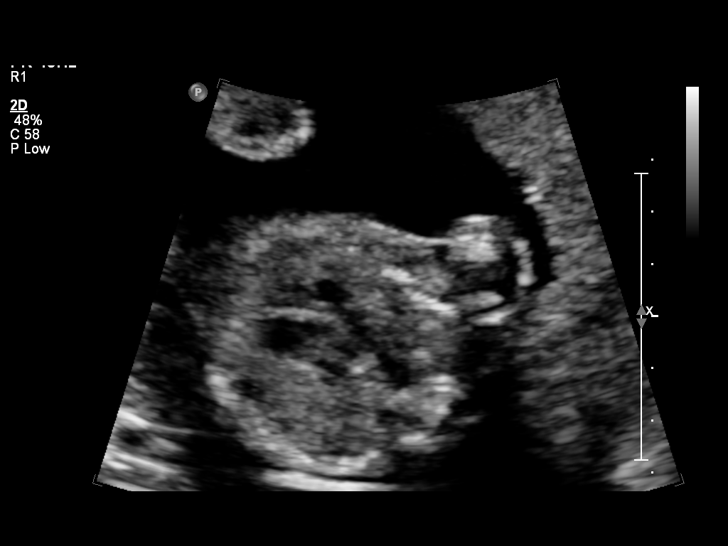
[im 60/134]
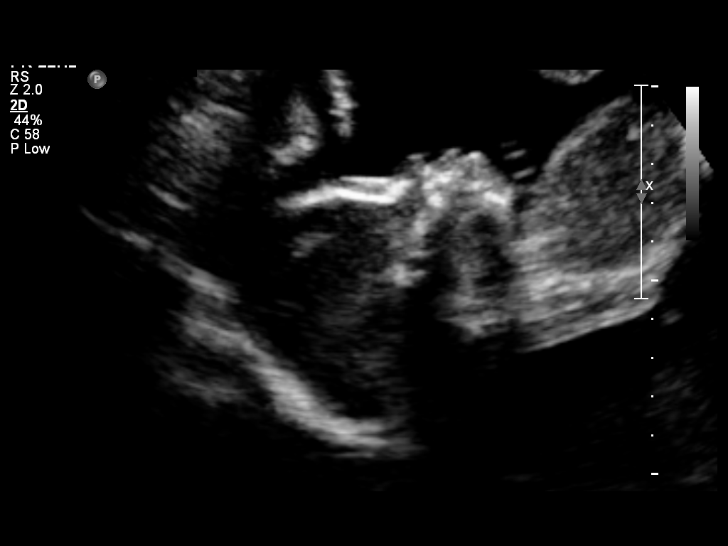
[im 74/134]
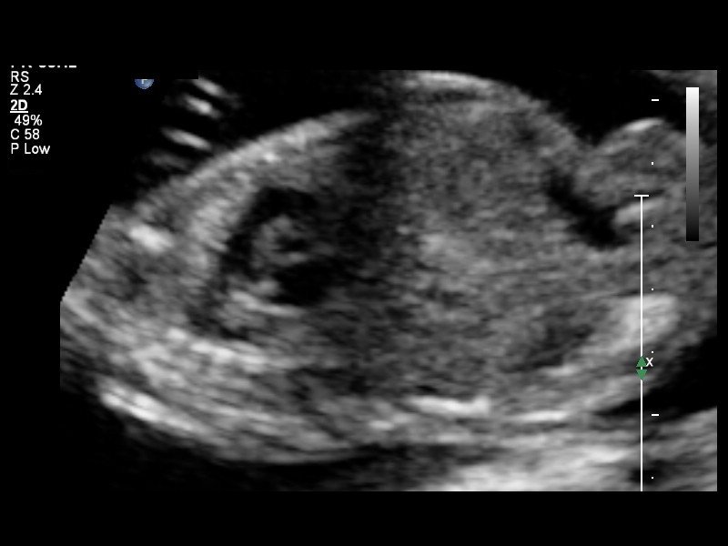
[im 84/134]
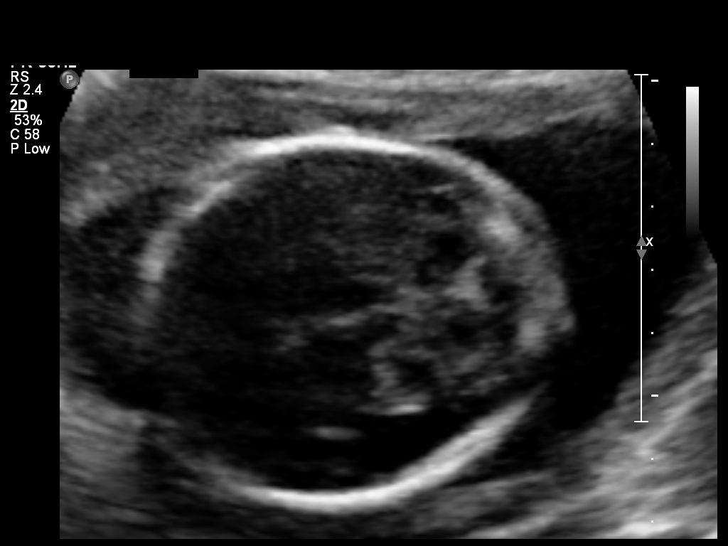
[im 94/134]
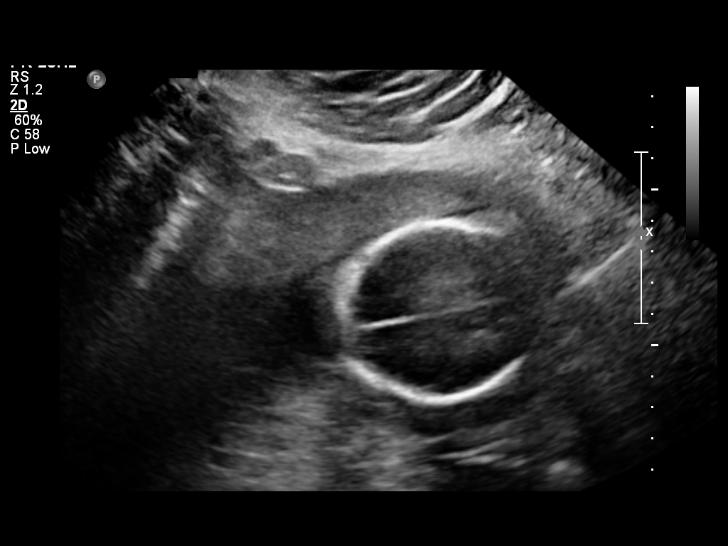
[im 109/134]
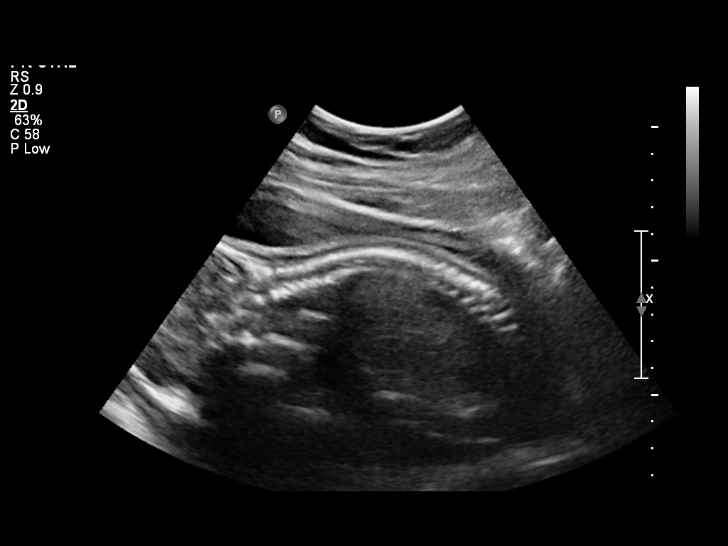
[im 119/134]
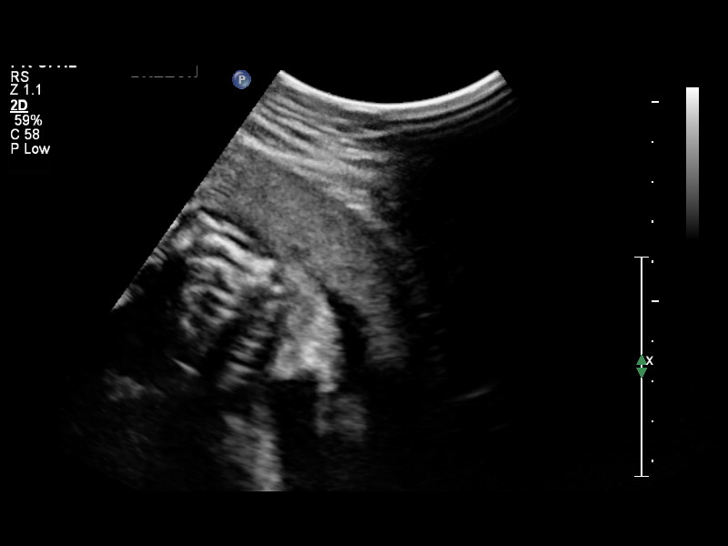
[im 129/134]
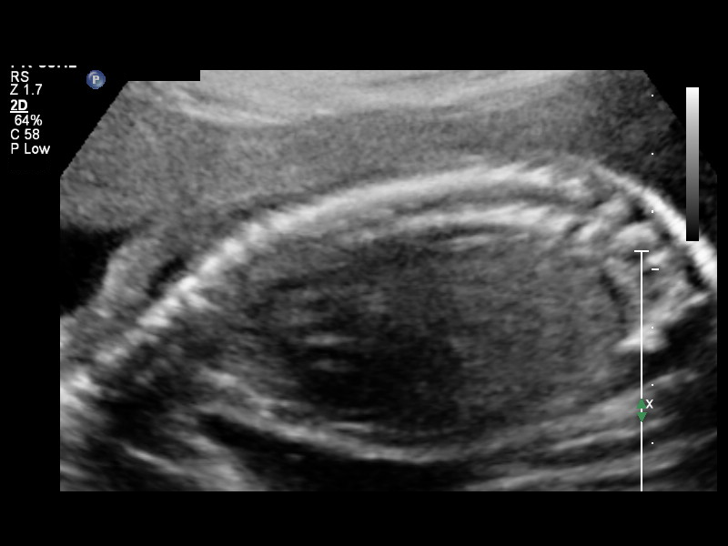

[12 of 28 positions shown; findings below may reference images not displayed]

OBSTETRICS REPORT
(Signed Final [DATE] [DATE])

Date:

Service(s) Provided

US OB FOLLOW UP                                        76816.1
Indications

23 weeks gestation of pregnancy
Evaluate anatomy not seen on prior sonogram            Z36
Maternal morbid obesity                                [LN] [LN]
Other mental disorder comlplicating pregnancy,         [LN]
unspecified trimester (Major depressive disorder,
suicidal ideation)
Fetal Evaluation

Num Of             1
Fetuses:
Fetal Heart        146                          bpm
Rate:
Cardiac Activity:  Observed
Presentation:      Breech
Placenta:          Anterior, above cervical
os
P. Cord            Previously Visualized
Insertion:

Amniotic Fluid
AFI FV:      Subjectively within normal limits
Larg Pckt:    6.47   cm
Biometry

BPD:     56.4   m    G. Age:   23w 2d                 CI:        70.75   70 - 86
m
FL/HC:      17.8   19.2 -
20.8
HC:     213.7   m    G. Age:   23w 3d        40  %    HC/AC:      1.15   1.05 -
m
AC:     186.3   m    G. Age:   23w 3d        46  %    FL/BPD      67.6   71 - 87
m                                     :
FL:      38.1   m    G. Age:   22w 2d        10  %    FL/AC:      20.5   20 - 24
m
HUM:     36.4   m    G. Age:   22w 5d        29  %
m
CER:     24.8   m    G. Age:   22w 6d        42  %
m

Est.         549   gm    1 lb 3 oz      46   %
FW:
Gestational Age

LMP:           25w 4d        Date:  [DATE]                  EDD:   [DATE]
U/S Today:     23w 1d                                         EDD:   [DATE]
Best:          23w 2d    Det. By:   Early Ultrasound          EDD:   [DATE]
([DATE])
Anatomy

Cranium:          Previously seen        Aortic Arch:       Appears normal
Fetal Cavum:      Previously seen        Ductal Arch:       Appears normal
Ventricles:       Appears normal         Diaphragm:         Appears normal
Choroid Plexus:   Previously seen        Stomach:           Appears normal,
left sided
Cerebellum:       Previously seen        Abdomen:           Previously seen
Posterior         Previously seen        Abdominal          Previously seen
Fossa:                                   Wall:
Nuchal Fold:      Previously seen        Cord Vessels:      Previously seen
Face:             Orbits previously      Kidneys:           Appear normal
seen
Lips:             Previously seen        Bladder:           Appears normal
Palate:           Not well visualized    Spine:             Limited views
appear normal
Heart:            Appears normal         Lower              Previously seen
(4CH, axis, and        Extremities:
situs)
RVOT:             Appears normal         Upper              Previously seen
Extremities:
LVOT:             Appears normal

Other:   Fetus appears to be a female. Heels visualized previously.  5th digit
appears normal. Technically difficult due to maternal habitus and
fetal position.
Targeted Anatomy

Fetal Central Nervous System
Lat. Ventricles:
Cervix Uterus Adnexa

Cervical Length:    4.79      cm

Cervix:       Normal appearance by transabdominal scan.
Uterus:       No abnormality visualized.
Cul De Sac:   No free fluid seen.

Left Ovary:    No adnexal mass visualized.
Right Ovary:   No adnexal mass visualized.

Adnexa:     No adnexal mass visualized.
Impression

Single IUP at 23w 2d
Normal interval anatomy - the anatomic fetal survey is now
complete
Interval growth is appropriate (46th %tile)
Anterior placenta without previa
Normal amniotic fluid volume
Recommendations

Recommend follow-up ultrasound examination in the 3rd
trimester (
 30 weeks) for growth given obesity.
Otherwise, follow up ultrasounds as clinically indicated

PEJO IVANA with us.  Please do not hesitate to contact

## 2015-07-26 ENCOUNTER — Other Ambulatory Visit (HOSPITAL_COMMUNITY): Payer: Self-pay | Admitting: Nurse Practitioner

## 2015-07-26 DIAGNOSIS — O99213 Obesity complicating pregnancy, third trimester: Principal | ICD-10-CM

## 2015-07-26 DIAGNOSIS — O99340 Other mental disorders complicating pregnancy, unspecified trimester: Secondary | ICD-10-CM

## 2015-07-26 DIAGNOSIS — Z3A29 29 weeks gestation of pregnancy: Secondary | ICD-10-CM

## 2015-07-26 DIAGNOSIS — Z3689 Encounter for other specified antenatal screening: Secondary | ICD-10-CM

## 2015-07-30 LAB — OB RESULTS CONSOLE HGB/HCT, BLOOD
HEMATOCRIT: 43 %
HEMOGLOBIN: 13.4 g/dL

## 2015-07-30 LAB — OB RESULTS CONSOLE HIV ANTIBODY (ROUTINE TESTING): HIV: NONREACTIVE

## 2015-08-03 LAB — OB RESULTS CONSOLE PLATELET COUNT: Platelets: 252 10*3/uL

## 2015-08-03 LAB — OB RESULTS CONSOLE HGB/HCT, BLOOD
HCT: 36 %
Hemoglobin: 12.2 g/dL

## 2015-08-03 LAB — PROTEIN, TOTAL: Total Protein, Urine: 268

## 2015-08-06 ENCOUNTER — Ambulatory Visit (HOSPITAL_COMMUNITY)
Admission: RE | Admit: 2015-08-06 | Discharge: 2015-08-06 | Disposition: A | Discharge status: Home / Self Care | Payer: Medicaid Other | Source: Ambulatory Visit | Attending: Nurse Practitioner | Admitting: Nurse Practitioner

## 2015-08-06 ENCOUNTER — Other Ambulatory Visit (HOSPITAL_COMMUNITY): Payer: Self-pay | Admitting: Nurse Practitioner

## 2015-08-06 DIAGNOSIS — O99213 Obesity complicating pregnancy, third trimester: Secondary | ICD-10-CM | POA: Diagnosis present

## 2015-08-06 DIAGNOSIS — Z3689 Encounter for other specified antenatal screening: Secondary | ICD-10-CM

## 2015-08-06 DIAGNOSIS — O99340 Other mental disorders complicating pregnancy, unspecified trimester: Secondary | ICD-10-CM

## 2015-08-06 DIAGNOSIS — Z3A29 29 weeks gestation of pregnancy: Secondary | ICD-10-CM | POA: Diagnosis not present

## 2015-08-06 DIAGNOSIS — Z36 Encounter for antenatal screening of mother: Secondary | ICD-10-CM | POA: Diagnosis not present

## 2015-08-06 IMAGING — US US MFM FETAL BPP W/O NON-STRESS
1 series · 13 of 28 positions shown · non-contrast
Comparison: none

OBSTETRICS REPORT

DAVIAN NP
Service(s) Provided
US MFM UA CORD DOPPLER                                76820.02
Indications
29 weeks gestation of pregnancy
Maternal morbid obesity                               [YB] [YB]
Other mental disorder comlplicating pregnancy,        [YB]
unspecified trimester (Major depressive disorder,
suicidal ideation)
Maternal care for known or suspected poor fetal       [YB]
growth, third trimester, not applicable or unspecified
Abnormal biochemical finding on antenatal             [YB]
screening of DAVIAN
Fetal Evaluation
Num Of Fetuses:    1
Fetal Heart Rate:  148                          bpm
Cardiac Activity:  Observed
Presentation:      Breech
Placenta:          Anterior, above cervical os
P. Cord            Previously Visualized
Insertion:
Amniotic Fluid
AFI FV:      Subjectively within normal limits
AFI Sum:     17.2    cm       64  %Tile     Larg Pckt:     6.2  cm
RUQ:   6.2     cm   RLQ:    6      cm    LLQ:   5       cm
Biophysical Evaluation
Amniotic F.V:   Within normal limits       F. Tone:        Observed
F. Movement:    Observed                   Score:          [DATE]
F. Breathing:   Observed
Biometry
BPD:     72.3  mm     G. Age:  29w 0d                CI:        78.29   70 - 86
FL/HC:      20.0   19.6 -
20.8
HC:     258.5  mm     G. Age:  28w 1d      < 3  %    HC/AC:      1.11   0.99 -
1.21
AC:     232.1  mm     G. Age:  27w 4d        7  %    FL/BPD:     71.4   71 - 87
FL:      51.6  mm     G. Age:  27w 4d        5  %    FL/AC:      22.2   20 - 24
HUM:     46.8  mm     G. Age:  27w 4d       11  %
Est. FW:    [YB]  gm      2 lb 7 oz     23  %
Gestational Age
LMP:           31w 4d        Date:  [DATE]                 EDD:   [DATE]
U/S Today:     28w 0d                                        EDD:   [DATE]
Best:          29w 2d     Det. By:  Early Ultrasound         EDD:   [DATE]
([DATE])
Anatomy
Cranium:          Appears normal         Aortic Arch:      Previously seen
Fetal Cavum:      Previously seen        Ductal Arch:      Previously seen
Ventricles:       Appears normal         Diaphragm:        Appears normal
Choroid Plexus:   Previously seen        Stomach:          Appears normal, left
sided
Cerebellum:       Previously seen        Abdomen:          Previously seen
Posterior Fossa:  Previously seen        Abdominal Wall:   Previously seen
Nuchal Fold:      Previously seen        Cord Vessels:     Previously seen
Face:             Orbits previously      Kidneys:          Appear normal
seen
Lips:             Previously seen        Bladder:          Appears normal
Palate:           Not well visualized    Spine:            Not well visualized
Heart:            Echogenic focus        Lower             Previously seen
in RV
Extremities:
RVOT:             Previously seen        Upper             Previously seen
LVOT:             Previously seen
Other:  Fetus appears to be a female. Heels visualized previously.  5th digit
appears normal. Technically difficult due to maternal habitus and fetal
position.
Doppler - Fetal Vessels
Umbilical Artery
S/D:   4.33           96  %tile
PSV:       37.34   cm/s
Absent DFV:    No     Reverse DFV:    No
Cervix Uterus Adnexa
Cervical Length:    3.6      cm
Cervix:       Normal appearance by transabdominal scan.
Left Ovary:    Not visualized. No adnexal mass visualized.
Right Ovary:   Not visualized. No adnexal mass visualized.
Impression
INDICATION: 24 yr old G1P0 at [YB] with DAVIAN and
obesity for fetal growth.

[Series 1: us mfm fetal bpp w/o non-stress · 51 acquisitions, 13 frames shown]
[im 2/51]
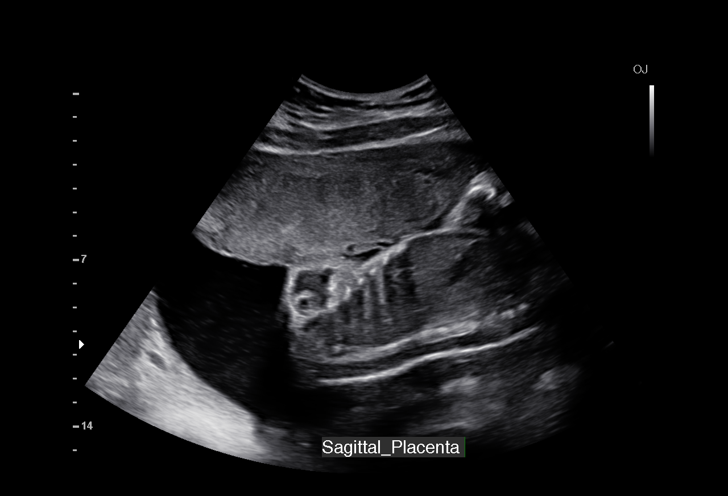
[im 6/51]
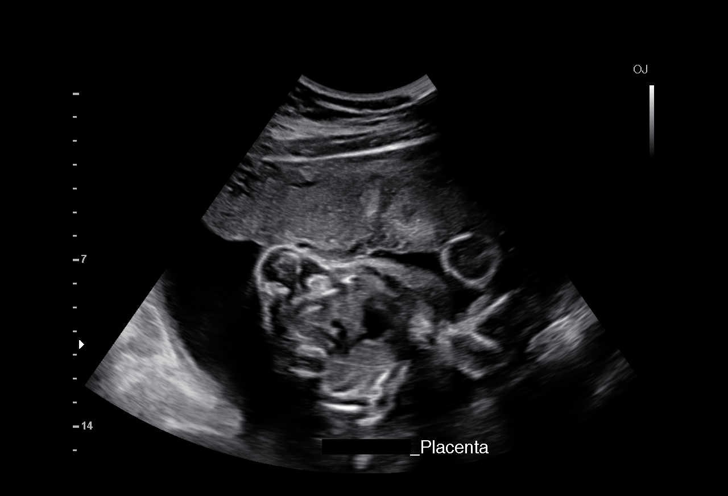
[im 10/51]
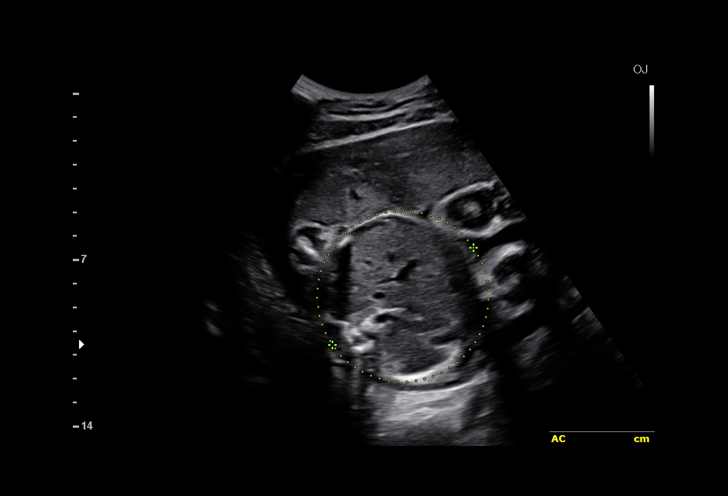
[im 13/51]
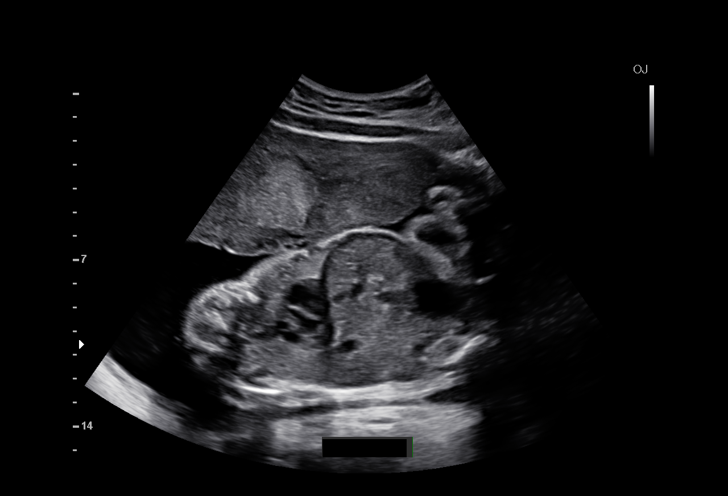
[im 17/51]
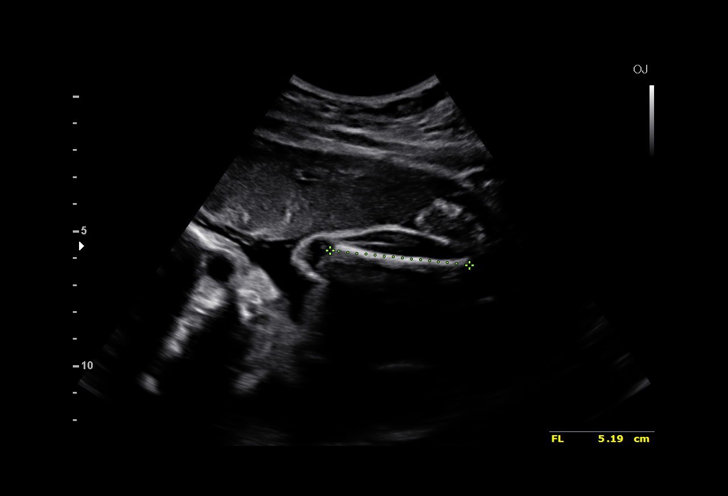
[im 21/51]
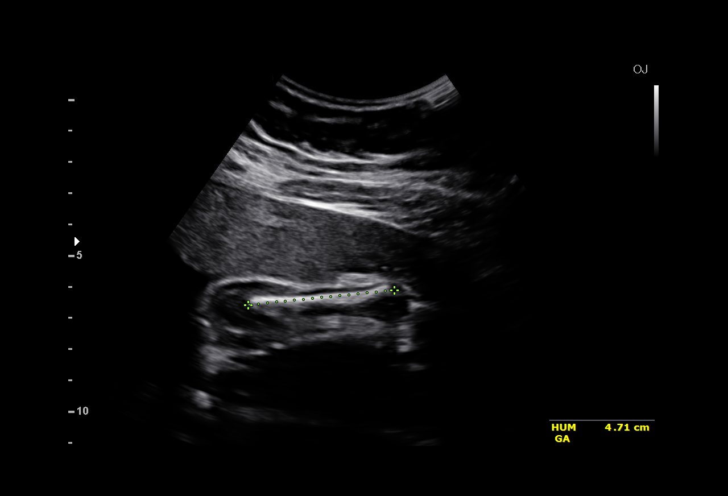
[im 26/51]
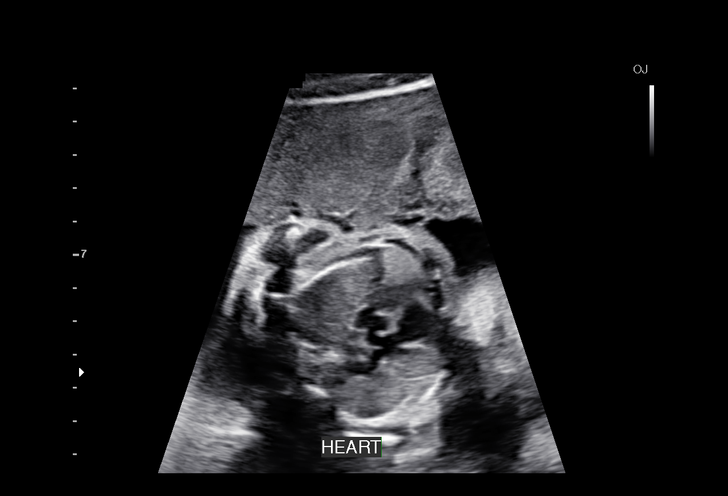
[im 30/51]
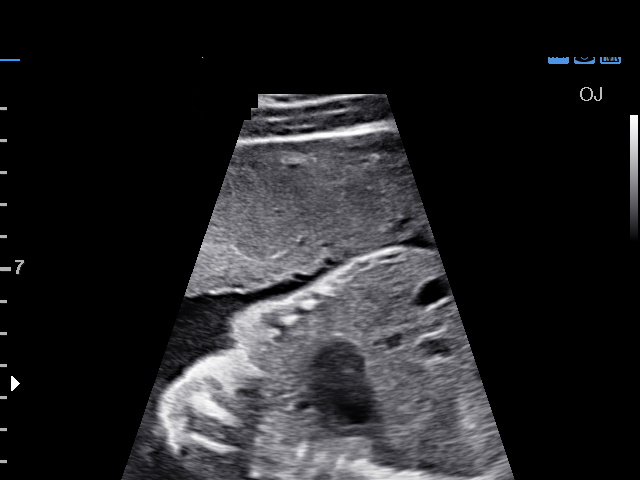
[im 34/51]
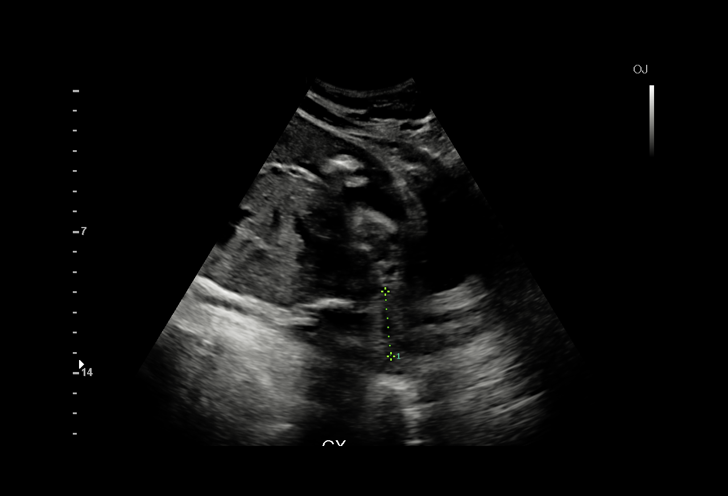
[im 38/51]
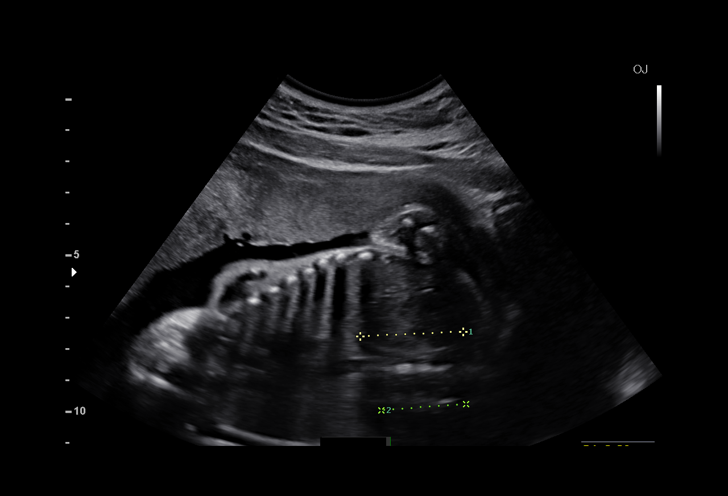
[im 41/51]
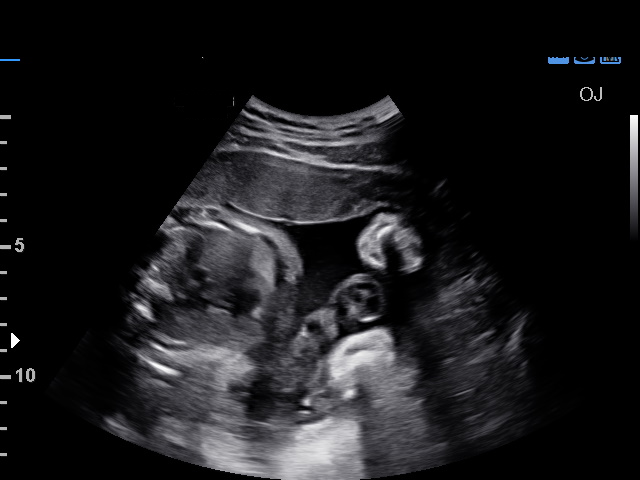
[im 45/51]
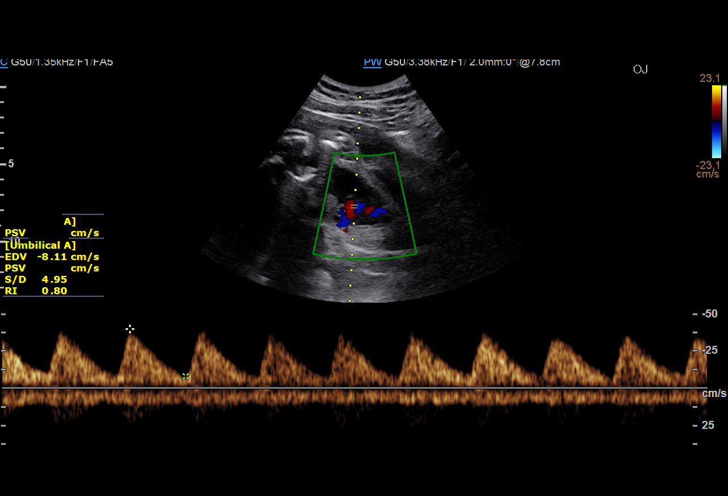
[im 49/51]
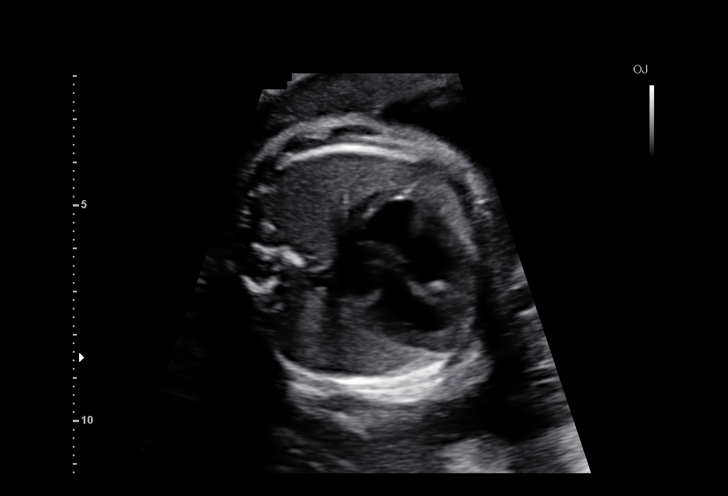

[13 of 28 positions shown; findings below may reference images not displayed]

FINDINGS: 1. Single intrauterine pregnancy.
2. Estimated fetal weight is in the 23rd%. The abdominal
circumference is in the 7th%.
3. Anterior placenta without evidence of previa.
4. Normal amniotic fluid index.
5. Normal transabdominal cervical length.
6. There is an echogenic focus in the right ventricle.
7. The views of the spine remain limited.
8. The remainder of the limited anatomy survey is normal.
9. Normal umbilical artery Doppler studies but at top end of
normal.
10. Normal biophysical profile of [DATE].
Recommendations

1. Overall appropriate fetal growth but abdominal
circumference is lagging:
- given top normal Doppler studies recommend weekly
Doppler studies and BPPs
- repeat fetal growth in 3 weeks
- recommend fetal DAVIAN
2. DAVIAN:
- discussed association with fetal aneuploidy- had low risk for
aneuploidy on first trimester screen
- discussed association with increased risk of fetal growth
restriction, preeclampsia, stillbirth, abruption, and preterm
delivery
- recommend serial fetal growth and close surveillance for
development of signs/symptoms of preeclampsia
3. Obesity:
- recommend serial fetal growth
4. Echogenic focus in the right ventricle:
- not seen on anatomy survey
- low risk for aneuploidy on first trimester screen

## 2015-08-13 ENCOUNTER — Encounter (HOSPITAL_COMMUNITY): Payer: Self-pay

## 2015-08-13 ENCOUNTER — Ambulatory Visit (HOSPITAL_COMMUNITY)
Admission: RE | Admit: 2015-08-13 | Discharge: 2015-08-13 | Disposition: A | Discharge status: Home / Self Care | Payer: Medicaid Other | Source: Ambulatory Visit | Attending: Nurse Practitioner | Admitting: Nurse Practitioner

## 2015-08-13 VITALS — BP 108/43 | HR 82 | Wt 267.0 lb

## 2015-08-13 DIAGNOSIS — Z3A00 Weeks of gestation of pregnancy not specified: Secondary | ICD-10-CM | POA: Insufficient documentation

## 2015-08-13 DIAGNOSIS — O219 Vomiting of pregnancy, unspecified: Secondary | ICD-10-CM

## 2015-08-13 DIAGNOSIS — O365990 Maternal care for other known or suspected poor fetal growth, unspecified trimester, not applicable or unspecified: Secondary | ICD-10-CM | POA: Insufficient documentation

## 2015-08-16 ENCOUNTER — Encounter: Payer: Self-pay | Admitting: Family Medicine

## 2015-08-20 ENCOUNTER — Ambulatory Visit (HOSPITAL_COMMUNITY)
Admission: RE | Admit: 2015-08-20 | Discharge: 2015-08-20 | Disposition: A | Discharge status: Home / Self Care | Payer: Medicaid Other | Source: Ambulatory Visit | Attending: Nurse Practitioner | Admitting: Nurse Practitioner

## 2015-08-20 ENCOUNTER — Other Ambulatory Visit (HOSPITAL_COMMUNITY): Payer: Self-pay | Admitting: Obstetrics and Gynecology

## 2015-08-20 DIAGNOSIS — Z3A31 31 weeks gestation of pregnancy: Secondary | ICD-10-CM

## 2015-08-20 DIAGNOSIS — O99213 Obesity complicating pregnancy, third trimester: Secondary | ICD-10-CM | POA: Insufficient documentation

## 2015-08-20 DIAGNOSIS — O99343 Other mental disorders complicating pregnancy, third trimester: Secondary | ICD-10-CM

## 2015-08-20 DIAGNOSIS — O281 Abnormal biochemical finding on antenatal screening of mother: Secondary | ICD-10-CM | POA: Insufficient documentation

## 2015-08-20 DIAGNOSIS — O365930 Maternal care for other known or suspected poor fetal growth, third trimester, not applicable or unspecified: Secondary | ICD-10-CM | POA: Insufficient documentation

## 2015-08-20 IMAGING — US US MFM FETAL BPP W/O NON-STRESS
1 series · 15 of 21 positions shown · non-contrast
Comparison: none

[Series 1: us mfm fetal bpp w/o non-stress · 21 acquisitions, 15 frames shown]
[im 1/21]
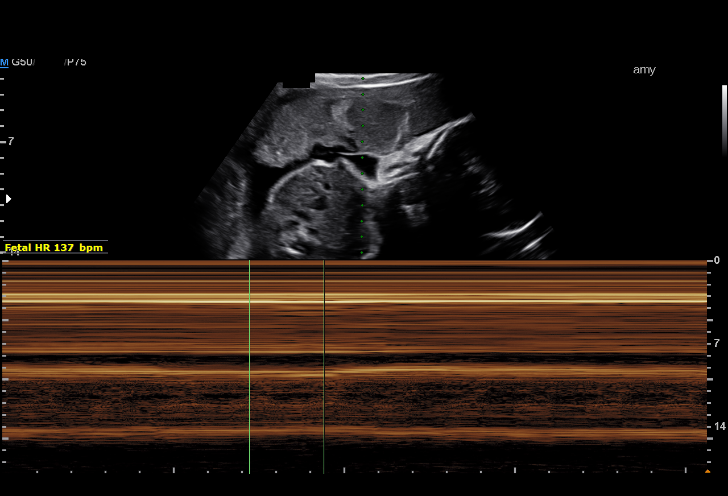
[im 3/21]
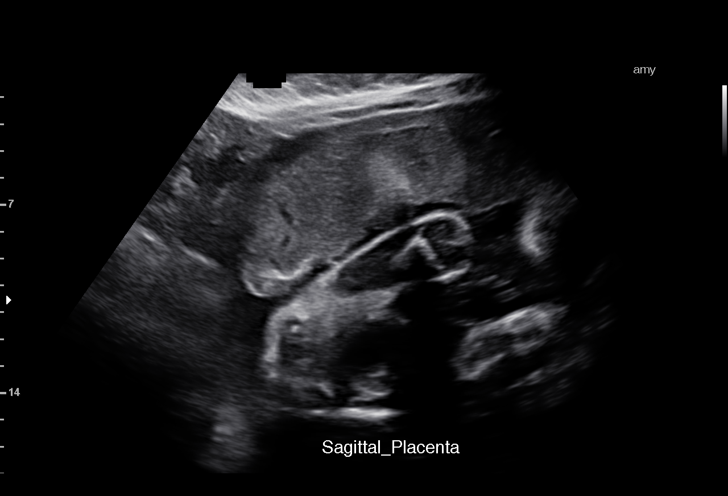
[im 4/21]
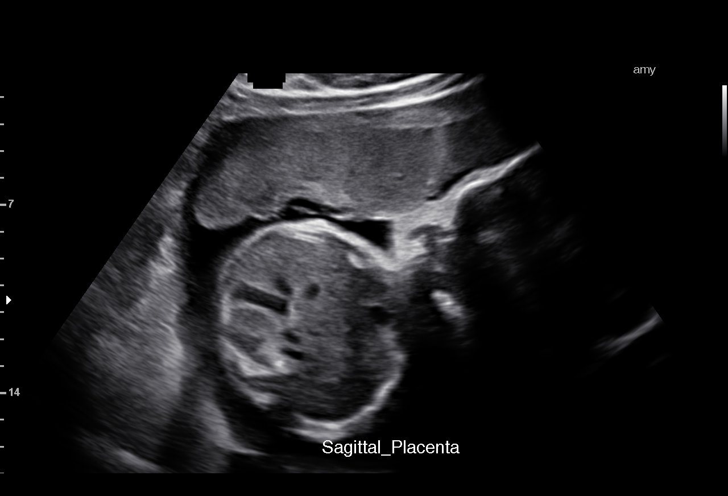
[im 5/21]
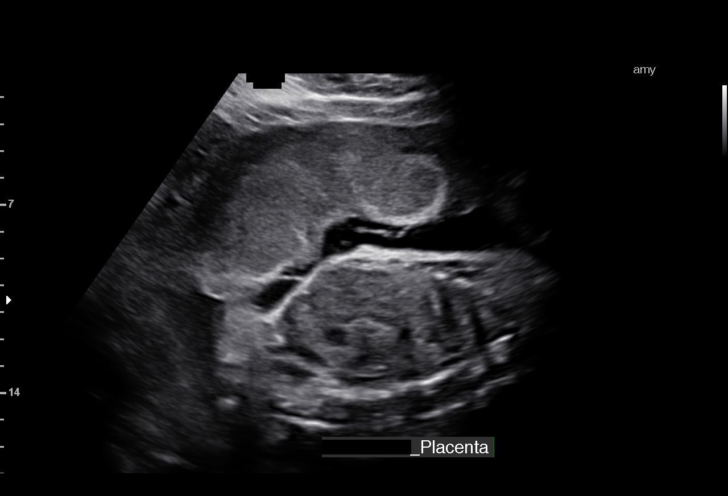
[im 7/21]
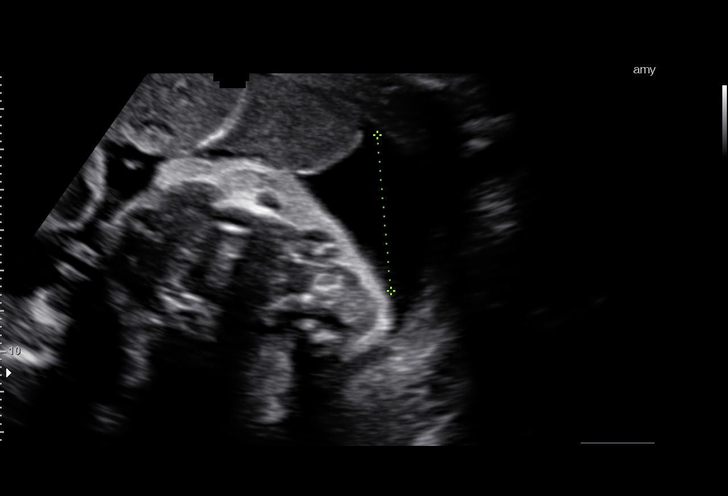
[im 8/21]
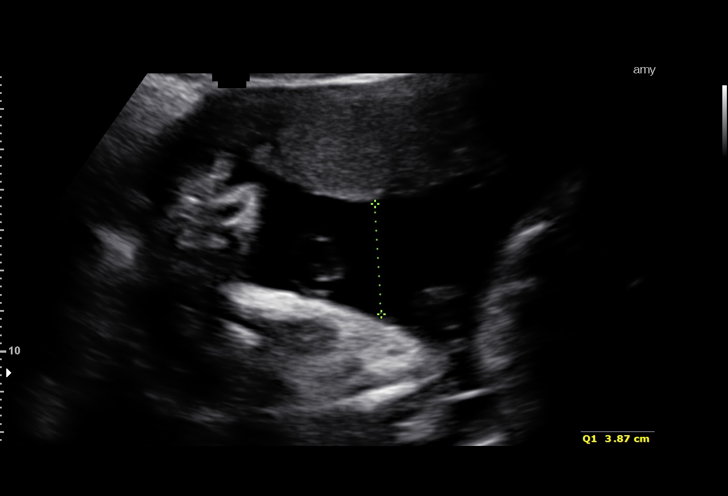
[im 10/21]
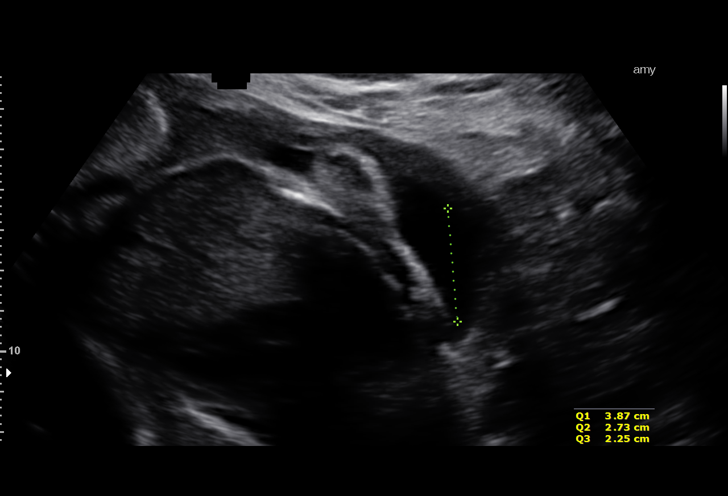
[im 11/21]
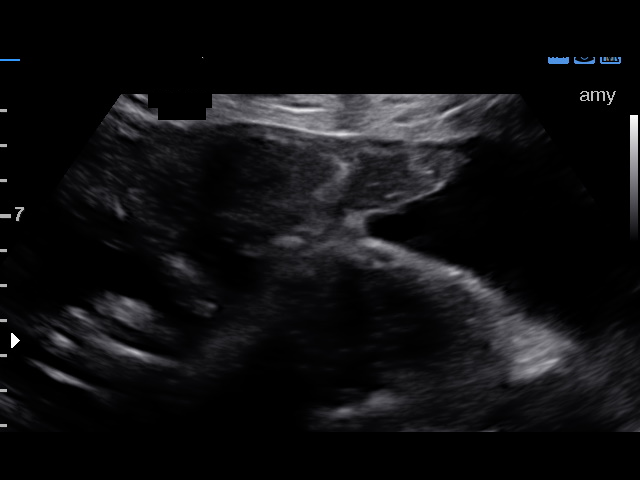
[im 12/21]
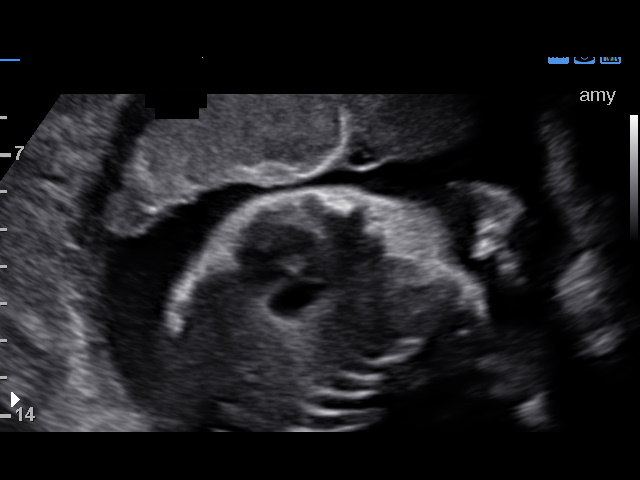
[im 14/21]
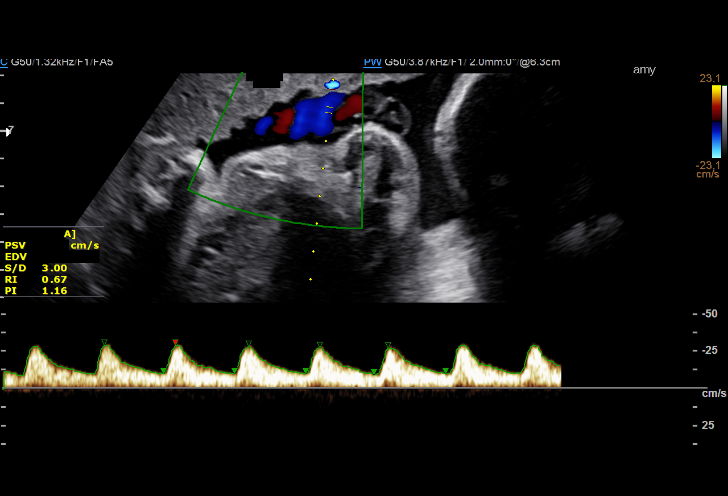
[im 15/21]
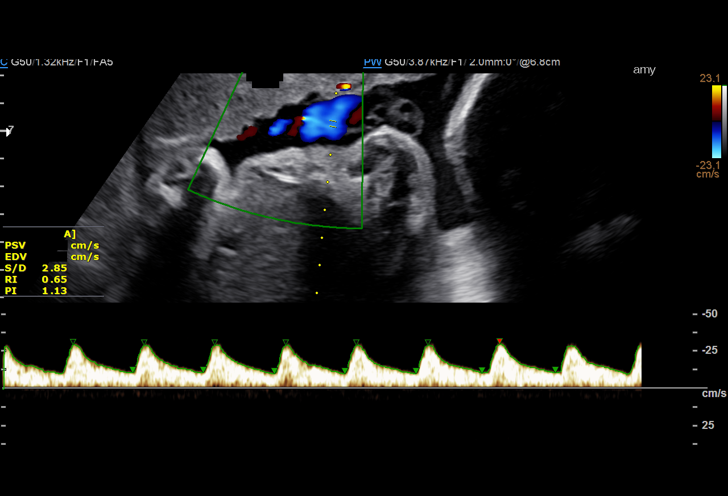
[im 17/21]
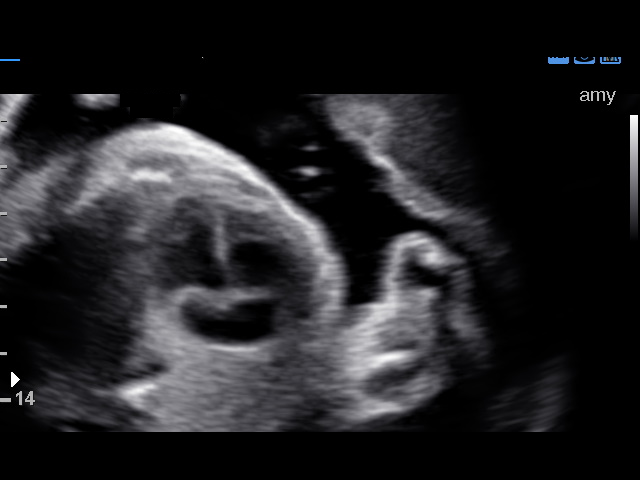
[im 18/21]
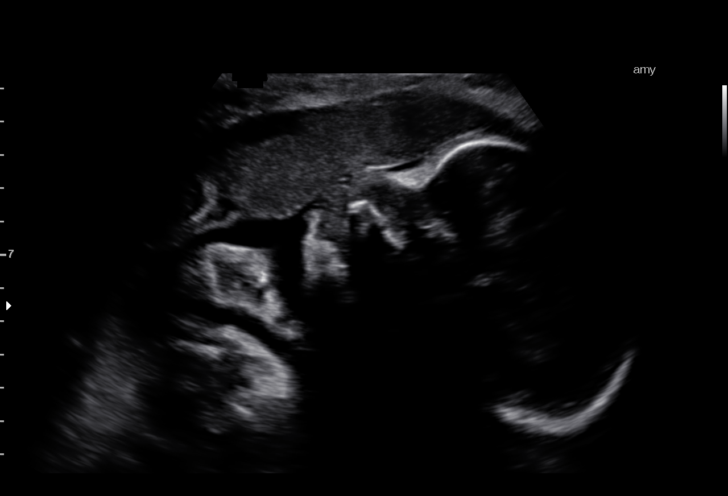
[im 19/21]
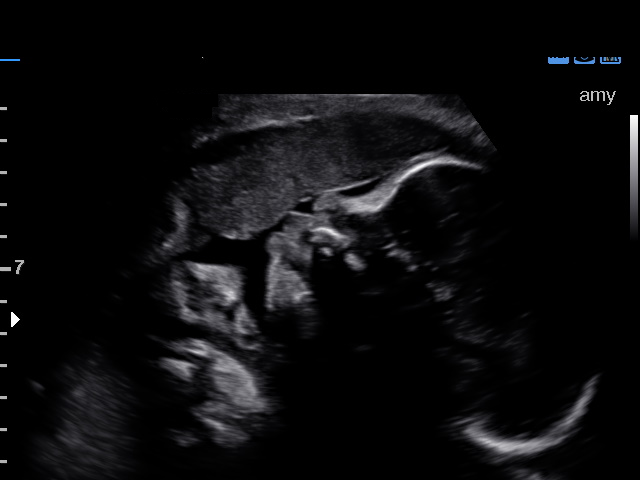
[im 21/21]
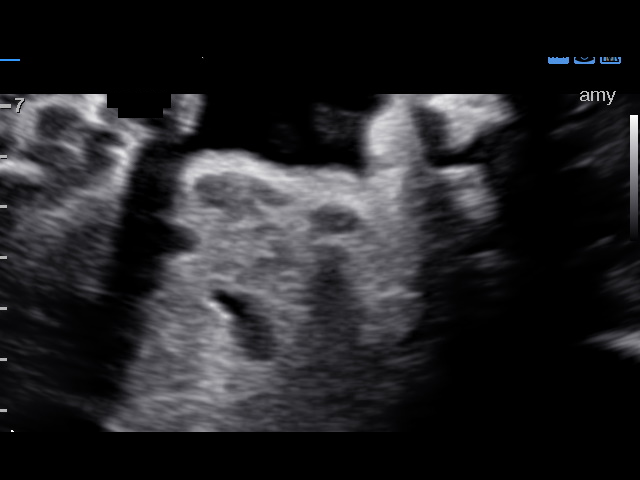

[15 of 21 positions shown; findings below may reference images not displayed]

OBSTETRICS REPORT
(Signed Final [DATE] [DATE])

Service(s) Provided

US MFM UA CORD DOPPLER                                76820.02
Indications

Maternal morbid obesity                               [ZM] [ZM]
Other mental disorder comlplicating pregnancy,        [ZM]
unspecified trimester (Major depressive disorder,
suicidal ideation)
Maternal care for known or suspected poor fetal       [ZM]
growth, third trimester, not applicable or unspecified
Abnormal biochemical finding on antenatal             [ZM]
screening of ONDO
31 weeks gestation of pregnancy
Fetal Evaluation

Num Of Fetuses:    1
Fetal Heart Rate:  137                          bpm
Cardiac Activity:  Observed
Presentation:      Cephalic
Placenta:          Anterior, above cervical os
P. Cord            Previously Visualized
Insertion:

Amniotic Fluid
AFI FV:      Subjectively within normal limits
AFI Sum:     11.66   cm       28  %Tile     Larg Pckt:    3.87  cm
RUQ:   3.87    cm   RLQ:    2.73   cm    LUQ:   2.25    cm   LLQ:    2.81   cm
Biophysical Evaluation

Amniotic F.V:   Within normal limits       F. Tone:        Observed
F. Movement:    Observed                   Score:          [DATE]
F. Breathing:   Observed
Gestational Age

LMP:           33w 4d        Date:  [DATE]                 EDD:   [DATE]
Best:          31w 2d     Det. By:  Early Ultrasound         EDD:   [DATE]
([DATE])
Doppler - Fetal Vessels
Umbilical Artery
S/D:   2.88           57  %tile

Cervix Uterus Adnexa

Cervix:       Not visualized (advanced GA >[ZM])
Impression

Singleton intrauterine pregnancy at 31 weeks 2 days
gestation with fetal cardiac activity
Cephalic presentation
BPP [DATE]
AFI is normal
Umbilical artery S/D is normal
Recommendations

weekly Doppler studies and BPPs as scheduled
interval growth in 1 week (if growth and Dopplers are normal,
our unit will consider following by interval growth q3-4 wks).

## 2015-08-20 NOTE — ED Notes (Signed)
Pt states she has not felt baby move since Wednesday (9/21).  Has felt some cramping but denies bleeding.

## 2015-08-23 ENCOUNTER — Encounter: Payer: Self-pay | Admitting: Family Medicine

## 2015-08-23 ENCOUNTER — Ambulatory Visit (INDEPENDENT_AMBULATORY_CARE_PROVIDER_SITE_OTHER): Payer: Medicaid Other | Admitting: Family Medicine

## 2015-08-23 VITALS — BP 128/71 | HR 85 | Temp 99.0°F | Wt 267.9 lb

## 2015-08-23 DIAGNOSIS — O0993 Supervision of high risk pregnancy, unspecified, third trimester: Secondary | ICD-10-CM

## 2015-08-23 DIAGNOSIS — E669 Obesity, unspecified: Secondary | ICD-10-CM

## 2015-08-23 DIAGNOSIS — O365930 Maternal care for other known or suspected poor fetal growth, third trimester, not applicable or unspecified: Secondary | ICD-10-CM | POA: Diagnosis present

## 2015-08-23 DIAGNOSIS — O289 Unspecified abnormal findings on antenatal screening of mother: Secondary | ICD-10-CM

## 2015-08-23 DIAGNOSIS — F332 Major depressive disorder, recurrent severe without psychotic features: Secondary | ICD-10-CM | POA: Diagnosis not present

## 2015-08-23 DIAGNOSIS — O99343 Other mental disorders complicating pregnancy, third trimester: Secondary | ICD-10-CM | POA: Diagnosis not present

## 2015-08-23 DIAGNOSIS — F329 Major depressive disorder, single episode, unspecified: Secondary | ICD-10-CM | POA: Diagnosis not present

## 2015-08-23 DIAGNOSIS — O99213 Obesity complicating pregnancy, third trimester: Secondary | ICD-10-CM | POA: Diagnosis not present

## 2015-08-23 DIAGNOSIS — O0990 Supervision of high risk pregnancy, unspecified, unspecified trimester: Secondary | ICD-10-CM | POA: Insufficient documentation

## 2015-08-23 LAB — POCT URINALYSIS DIP (DEVICE)
BILIRUBIN URINE: NEGATIVE
Glucose, UA: NEGATIVE mg/dL
KETONES UR: NEGATIVE mg/dL
NITRITE: NEGATIVE
Protein, ur: NEGATIVE mg/dL
SPECIFIC GRAVITY, URINE: 1.025 (ref 1.005–1.030)
Urobilinogen, UA: 0.2 mg/dL (ref 0.0–1.0)
pH: 5 (ref 5.0–8.0)

## 2015-08-23 NOTE — Progress Notes (Signed)
Subjective:  Alexandra Gray is a 24 y.o. G1P0 at [redacted]w[redacted]d being seen today for ongoing prenatal care.  Patient reports no complaints.  Contractions: Irregular.  Vag. Bleeding: None. Movement: Present. Denies leaking of fluid.   Patient is here to establish care. She was seen for most of pregnancy at [facility]. She had abnormal PAPPA and has been receiving weekly BPP and dopplers at MFM. Testing so far reassuring.   #h/o depression: chronic and father in room says patient feels down often. She stopped all medications when she found out she was pregnant. She does not "want to take anything that will hurt the baby"  The following portions of the patient's history were reviewed and updated as appropriate: allergies, current medications, past family history, past medical history, past social history, past surgical history and problem list.   Objective:   Filed Vitals:   08/23/15 0821  BP: 128/71  Pulse: 85  Temp: 99 F (37.2 C)  Weight: 267 lb 14.4 oz (121.519 kg)    Fetal Status: Fetal Heart Rate (bpm): 140 Fundal Height: 32 cm Movement: Present  Presentation: Vertex  General:  Alert, oriented and cooperative. Patient is in no acute distress.  Skin: Skin is warm and dry. No rash noted.   Cardiovascular: Normal heart rate noted  Respiratory: Normal respiratory effort, no problems with respiration noted  Abdomen: Soft, gravid, appropriate for gestational age. Pain/Pressure: Present     Pelvic: Vag. Bleeding: None     Cervical exam deferred        Extremities: Normal range of motion.  Edema: Trace  Mental Status: Normal mood and affect. Normal behavior. Normal judgment and thought content.   Urinalysis: Urine Protein: Negative Urine Glucose: Negative  Assessment and Plan:  Pregnancy: G1P0 at [redacted]w[redacted]d  1. Obesity in pregnancy, antepartum, third trimester -weight gain has been appropriate so far  2. Supervision of high risk pregnancy, antepartum, third trimester - Prescript Monitor  Profile(19)  3. Abnormal finding on antenatal screening of mother -Low PAPPA  -Weekly BPP and dopplers all reassuring   4. MDD (major depressive disorder), recurrent severe, without psychosis - Patient appears stable but has been hospitalized, last in 2014. She is not on any medications and does not endorse depression currently.  - Monitor mood regularly -2 week pp mood check with baby love RN  Preterm labor symptoms and general obstetric precautions including but not limited to vaginal bleeding, contractions, leaking of fluid and fetal movement were reviewed in detail with the patient. Please refer to After Visit Summary for other counseling recommendations.   Return in about 2 weeks (around 09/06/2015) for Routine prenatal care.  Federico Flake, MD

## 2015-08-23 NOTE — Progress Notes (Signed)
Here for initial visit, transferred from [facility] department. Urinalysis today shows trace hemoglobin, large leukocytes. Given new patient education packets. C/o feeling cramping every other day lasting an hour or so.

## 2015-08-23 NOTE — Progress Notes (Signed)
Nutrition note: 1st visit consult Pt has h/o obesity. Pt has gained 9.9# @ [redacted]w[redacted]d, which is wnl. Pt reports eating 3 meals & 1-2 snacks/d. Pt is taking a PNV. Pt reports some nausea & heartburn occ - takes meds for both. Pt received verbal & written education on general nutrition during pregnancy. Discussed BF tip of the week Discussed wt gain goals of 11-20# or 0.5#/wk. Pt agrees to continue taking a PNV. Pt has WIC & plans to BF. F/u as needed Blondell Reveal, MS, RD, LDN, [facility]

## 2015-08-23 NOTE — Patient Instructions (Signed)
Breastfeeding Deciding to breastfeed is one of the best choices you can make for you and your baby. A change in hormones during pregnancy causes your breast tissue to grow and increases the number and size of your milk ducts. These hormones also allow proteins, sugars, and fats from your blood supply to make breast milk in your milk-producing glands. Hormones prevent breast milk from being released before your baby is born as well as prompt milk flow after birth. Once breastfeeding has begun, thoughts of your baby, as well as his or her sucking or crying, can stimulate the release of milk from your milk-producing glands.  BENEFITS OF BREASTFEEDING For Your Baby  Your first milk (colostrum) helps your baby's digestive system function better.   There are antibodies in your milk that help your baby fight off infections.   Your baby has a lower incidence of asthma, allergies, and sudden infant death syndrome.   The nutrients in breast milk are better for your baby than infant formulas and are designed uniquely for your baby's needs.   Breast milk improves your baby's brain development.   Your baby is less likely to develop other conditions, such as childhood obesity, asthma, or type 2 diabetes mellitus.  For You   Breastfeeding helps to create a very special bond between you and your baby.   Breastfeeding is convenient. Breast milk is always available at the correct temperature and costs nothing.   Breastfeeding helps to burn calories and helps you lose the weight gained during pregnancy.   Breastfeeding makes your uterus contract to its prepregnancy size faster and slows bleeding (lochia) after you give birth.   Breastfeeding helps to lower your risk of developing type 2 diabetes mellitus, osteoporosis, and breast or ovarian cancer later in life. SIGNS THAT YOUR BABY IS HUNGRY Early Signs of Hunger  Increased alertness or activity.  Stretching.  Movement of the head from  side to side.  Movement of the head and opening of the mouth when the corner of the mouth or cheek is stroked (rooting).  Increased sucking sounds, smacking lips, cooing, sighing, or squeaking.  Hand-to-mouth movements.  Increased sucking of fingers or hands. Late Signs of Hunger  Fussing.  Intermittent crying. Extreme Signs of Hunger Signs of extreme hunger will require calming and consoling before your baby will be able to breastfeed successfully. Do not wait for the following signs of extreme hunger to occur before you initiate breastfeeding:   Restlessness.  A loud, strong cry.   Screaming. BREASTFEEDING BASICS Breastfeeding Initiation  Find a comfortable place to sit or lie down, with your neck and back well supported.  Place a pillow or rolled up blanket under your baby to bring him or her to the level of your breast (if you are seated). Nursing pillows are specially designed to help support your arms and your baby while you breastfeed.  Make sure that your baby's abdomen is facing your abdomen.   Gently massage your breast. With your fingertips, massage from your chest wall toward your nipple in a circular motion. This encourages milk flow. You may need to continue this action during the feeding if your milk flows slowly.  Support your breast with 4 fingers underneath and your thumb above your nipple. Make sure your fingers are well away from your nipple and your baby's mouth.   Stroke your baby's lips gently with your finger or nipple.   When your baby's mouth is open wide enough, quickly bring your baby to your   breast, placing your entire nipple and as much of the colored area around your nipple (areola) as possible into your baby's mouth.   More areola should be visible above your baby's upper lip than below the lower lip.   Your baby's tongue should be between his or her lower gum and your breast.   Ensure that your baby's mouth is correctly positioned  around your nipple (latched). Your baby's lips should create a seal on your breast and be turned out (everted).  It is common for your baby to suck about 2-3 minutes in order to start the flow of breast milk. Latching Teaching your baby how to latch on to your breast properly is very important. An improper latch can cause nipple pain and decreased milk supply for you and poor weight gain in your baby. Also, if your baby is not latched onto your nipple properly, he or she may swallow some air during feeding. This can make your baby fussy. Burping your baby when you switch breasts during the feeding can help to get rid of the air. However, teaching your baby to latch on properly is still the best way to prevent fussiness from swallowing air while breastfeeding. Signs that your baby has successfully latched on to your nipple:    Silent tugging or silent sucking, without causing you pain.   Swallowing heard between every 3-4 sucks.    Muscle movement above and in front of his or her ears while sucking.  Signs that your baby has not successfully latched on to nipple:   Sucking sounds or smacking sounds from your baby while breastfeeding.  Nipple pain. If you think your baby has not latched on correctly, slip your finger into the corner of your baby's mouth to break the suction and place it between your baby's gums. Attempt breastfeeding initiation again. Signs of Successful Breastfeeding Signs from your baby:   A gradual decrease in the number of sucks or complete cessation of sucking.   Falling asleep.   Relaxation of his or her body.   Retention of a small amount of milk in his or her mouth.   Letting go of your breast by himself or herself. Signs from you:  Breasts that have increased in firmness, weight, and size 1-3 hours after feeding.   Breasts that are softer immediately after breastfeeding.  Increased milk volume, as well as a change in milk consistency and color by  the fifth day of breastfeeding.   Nipples that are not sore, cracked, or bleeding. Signs That Your Baby is Getting Enough Milk  Wetting at least 3 diapers in a 24-hour period. The urine should be clear and pale yellow by age 5 days.  At least 3 stools in a 24-hour period by age 5 days. The stool should be soft and yellow.  At least 3 stools in a 24-hour period by age 7 days. The stool should be seedy and yellow.  No loss of weight greater than 10% of birth weight during the first 3 days of age.  Average weight gain of 4-7 ounces (113-198 g) per week after age 4 days.  Consistent daily weight gain by age 5 days, without weight loss after the age of 2 weeks. After a feeding, your baby may spit up a small amount. This is common. BREASTFEEDING FREQUENCY AND DURATION Frequent feeding will help you make more milk and can prevent sore nipples and breast engorgement. Breastfeed when you feel the need to reduce the fullness of your breasts   or when your baby shows signs of hunger. This is called "breastfeeding on demand." Avoid introducing a pacifier to your baby while you are working to establish breastfeeding (the first 4-6 weeks after your baby is born). After this time you may choose to use a pacifier. Research has shown that pacifier use during the first year of a baby's life decreases the risk of sudden infant death syndrome (SIDS). Allow your baby to feed on each breast as long as he or she wants. Breastfeed until your baby is finished feeding. When your baby unlatches or falls asleep while feeding from the first breast, offer the second breast. Because newborns are often sleepy in the first few weeks of life, you may need to awaken your baby to get him or her to feed. Breastfeeding times will vary from baby to baby. However, the following rules can serve as a guide to help you ensure that your baby is properly fed:  Newborns (babies 4 weeks of age or younger) may breastfeed every 1-3  hours.  Newborns should not go longer than 3 hours during the day or 5 hours during the night without breastfeeding.  You should breastfeed your baby a minimum of 8 times in a 24-hour period until you begin to introduce solid foods to your baby at around 6 months of age. BREAST MILK PUMPING Pumping and storing breast milk allows you to ensure that your baby is exclusively fed your breast milk, even at times when you are unable to breastfeed. This is especially important if you are going back to work while you are still breastfeeding or when you are not able to be present during feedings. Your lactation consultant can give you guidelines on how long it is safe to store breast milk.  A breast pump is a machine that allows you to pump milk from your breast into a sterile bottle. The pumped breast milk can then be stored in a refrigerator or freezer. Some breast pumps are operated by hand, while others use electricity. Ask your lactation consultant which type will work best for you. Breast pumps can be purchased, but some hospitals and breastfeeding support groups lease breast pumps on a monthly basis. A lactation consultant can teach you how to hand express breast milk, if you prefer not to use a pump.  CARING FOR YOUR BREASTS WHILE YOU BREASTFEED Nipples can become dry, cracked, and sore while breastfeeding. The following recommendations can help keep your breasts moisturized and healthy:  Avoid using soap on your nipples.   Wear a supportive bra. Although not required, special nursing bras and tank tops are designed to allow access to your breasts for breastfeeding without taking off your entire bra or top. Avoid wearing underwire-style bras or extremely tight bras.  Air dry your nipples for 3-4minutes after each feeding.   Use only cotton bra pads to absorb leaked breast milk. Leaking of breast milk between feedings is normal.   Use lanolin on your nipples after breastfeeding. Lanolin helps to  maintain your skin's normal moisture barrier. If you use pure lanolin, you do not need to wash it off before feeding your baby again. Pure lanolin is not toxic to your baby. You may also hand express a few drops of breast milk and gently massage that milk into your nipples and allow the milk to air dry. In the first few weeks after giving birth, some women experience extremely full breasts (engorgement). Engorgement can make your breasts feel heavy, warm, and tender to the   touch. Engorgement peaks within 3-5 days after you give birth. The following recommendations can help ease engorgement:  Completely empty your breasts while breastfeeding or pumping. You may want to start by applying warm, moist heat (in the shower or with warm water-soaked hand towels) just before feeding or pumping. This increases circulation and helps the milk flow. If your baby does not completely empty your breasts while breastfeeding, pump any extra milk after he or she is finished.  Wear a snug bra (nursing or regular) or tank top for 1-2 days to signal your body to slightly decrease milk production.  Apply ice packs to your breasts, unless this is too uncomfortable for you.  Make sure that your baby is latched on and positioned properly while breastfeeding. If engorgement persists after 48 hours of following these recommendations, contact your health care provider or a Advertising copywriterlactation consultant. OVERALL HEALTH CARE RECOMMENDATIONS WHILE BREASTFEEDING  Eat healthy foods. Alternate between meals and snacks, eating 3 of each per day. Because what you eat affects your breast milk, some of the foods may make your baby more irritable than usual. Avoid eating these foods if you are sure that they are negatively affecting your baby.  Drink milk, fruit juice, and water to satisfy your thirst (about 10 glasses a day).   Rest often, relax, and continue to take your prenatal vitamins to prevent fatigue, stress, and anemia.  Continue  breast self-awareness checks.  Avoid chewing and smoking tobacco.  Avoid alcohol and drug use. Some medicines that may be harmful to your baby can pass through breast milk. It is important to ask your health care provider before taking any medicine, including all over-the-counter and prescription medicine as well as vitamin and herbal supplements. It is possible to become pregnant while breastfeeding. If birth control is desired, ask your health care provider about options that will be safe for your baby. SEEK MEDICAL CARE IF:   You feel like you want to stop breastfeeding or have become frustrated with breastfeeding.  You have painful breasts or nipples.  Your nipples are cracked or bleeding.  Your breasts are red, tender, or warm.  You have a swollen area on either breast.  You have a fever or chills.  You have nausea or vomiting.  You have drainage other than breast milk from your nipples.  Your breasts do not become full before feedings by the fifth day after you give birth.  You feel sad and depressed.  Your baby is too sleepy to eat well.  Your baby is having trouble sleeping.   Your baby is wetting less than 3 diapers in a 24-hour period.  Your baby has less than 3 stools in a 24-hour period.  Your baby's skin or the white part of his or her eyes becomes yellow.   Your baby is not gaining weight by 355 days of age. SEEK IMMEDIATE MEDICAL CARE IF:   Your baby is overly tired (lethargic) and does not want to wake up and feed.  Your baby develops an unexplained fever. Document Released: 11/13/2005 Document Revised: 11/18/2013 Document Reviewed: 05/07/2013 [facility] Patient Information 2015 [place], [place]. This information is not intended to replace advice given to you by your health care provider. Make sure you discuss any questions you have with your health care provider. Third Trimester of Pregnancy The third trimester is from week 29 through week 42, months 7  through 9. The third trimester is a time when the fetus is growing rapidly. At the end of  the ninth month, the fetus is about 20 inches in length and weighs 6-10 pounds.  BODY CHANGES Your body goes through many changes during pregnancy. The changes vary from woman to woman.   Your weight will continue to increase. You can expect to gain 25-35 pounds (11-16 kg) by the end of the pregnancy.  You may begin to get stretch marks on your hips, abdomen, and breasts.  You may urinate more often because the fetus is moving lower into your pelvis and pressing on your bladder.  You may develop or continue to have heartburn as a result of your pregnancy.  You may develop constipation because certain hormones are causing the muscles that push waste through your intestines to slow down.  You may develop hemorrhoids or swollen, bulging veins (varicose veins).  You may have pelvic pain because of the weight gain and pregnancy hormones relaxing your joints between the bones in your pelvis. Backaches may result from overexertion of the muscles supporting your posture.  You may have changes in your hair. These can include thickening of your hair, rapid growth, and changes in texture. Some women also have hair loss during or after pregnancy, or hair that feels dry or thin. Your hair will most likely return to normal after your baby is born.  Your breasts will continue to grow and be tender. A yellow discharge may leak from your breasts called colostrum.  Your belly button may stick out.  You may feel short of breath because of your expanding uterus.  You may notice the fetus "dropping," or moving lower in your abdomen.  You may have a bloody mucus discharge. This usually occurs a few days to a week before labor begins.  Your cervix becomes thin and soft (effaced) near your due date. WHAT TO EXPECT AT YOUR PRENATAL EXAMS  You will have prenatal exams every 2 weeks until week 36. Then, you will have  weekly prenatal exams. During a routine prenatal visit:  You will be weighed to make sure you and the fetus are growing normally.  Your blood pressure is taken.  Your abdomen will be measured to track your baby's growth.  The fetal heartbeat will be listened to.  Any test results from the previous visit will be discussed.  You may have a cervical check near your due date to see if you have effaced. At around 36 weeks, your caregiver will check your cervix. At the same time, your caregiver will also perform a test on the secretions of the vaginal tissue. This test is to determine if a type of bacteria, Group B streptococcus, is present. Your caregiver will explain this further. Your caregiver may ask you:  What your birth plan is.  How you are feeling.  If you are feeling the baby move.  If you have had any abnormal symptoms, such as leaking fluid, bleeding, severe headaches, or abdominal cramping.  If you have any questions. Other tests or screenings that may be performed during your third trimester include:  Blood tests that check for low iron levels (anemia).  Fetal testing to check the health, activity level, and growth of the fetus. Testing is done if you have certain medical conditions or if there are problems during the pregnancy. FALSE LABOR You may feel small, irregular contractions that eventually go away. These are called Braxton Hicks contractions, or false labor. Contractions may last for hours, days, or even weeks before true labor sets in. If contractions come at regular intervals, intensify, or  become painful, it is best to be seen by your caregiver.  SIGNS OF LABOR   Menstrual-like cramps.  Contractions that are 5 minutes apart or less.  Contractions that start on the top of the uterus and spread down to the lower abdomen and back.  A sense of increased pelvic pressure or back pain.  A watery or bloody mucus discharge that comes from the vagina. If you have  any of these signs before the 37th week of pregnancy, call your caregiver right away. You need to go to the hospital to get checked immediately. HOME CARE INSTRUCTIONS   Avoid all smoking, herbs, alcohol, and unprescribed drugs. These chemicals affect the formation and growth of the baby.  Follow your caregiver's instructions regarding medicine use. There are medicines that are either safe or unsafe to take during pregnancy.  Exercise only as directed by your caregiver. Experiencing uterine cramps is a good sign to stop exercising.  Continue to eat regular, healthy meals.  Wear a good support bra for breast tenderness.  Do not use hot tubs, steam rooms, or saunas.  Wear your seat belt at all times when driving.  Avoid raw meat, uncooked cheese, cat litter boxes, and soil used by cats. These carry germs that can cause birth defects in the baby.  Take your prenatal vitamins.  Try taking a stool softener (if your caregiver approves) if you develop constipation. Eat more high-fiber foods, such as fresh vegetables or fruit and whole grains. Drink plenty of fluids to keep your urine clear or pale yellow.  Take warm sitz baths to soothe any pain or discomfort caused by hemorrhoids. Use hemorrhoid cream if your caregiver approves.  If you develop varicose veins, wear support hose. Elevate your feet for 15 minutes, 3-4 times a day. Limit salt in your diet.  Avoid heavy lifting, wear low heal shoes, and practice good posture.  Rest a lot with your legs elevated if you have leg cramps or low back pain.  Visit your dentist if you have not gone during your pregnancy. Use a soft toothbrush to brush your teeth and be gentle when you floss.  A sexual relationship may be continued unless your caregiver directs you otherwise.  Do not travel far distances unless it is absolutely necessary and only with the approval of your caregiver.  Take prenatal classes to understand, practice, and ask questions  about the labor and delivery.  Make a trial run to the hospital.  Pack your hospital bag.  Prepare the baby's nursery.  Continue to go to all your prenatal visits as directed by your caregiver. SEEK MEDICAL CARE IF:  You are unsure if you are in labor or if your water has broken.  You have dizziness.  You have mild pelvic cramps, pelvic pressure, or nagging pain in your abdominal area.  You have persistent nausea, vomiting, or diarrhea.  You have a bad smelling vaginal discharge.  You have pain with urination. SEEK IMMEDIATE MEDICAL CARE IF:   You have a fever.  You are leaking fluid from your vagina.  You have spotting or bleeding from your vagina.  You have severe abdominal cramping or pain.  You have rapid weight loss or gain.  You have shortness of breath with chest pain.  You notice sudden or extreme swelling of your face, hands, ankles, feet, or legs.  You have not felt your baby move in over an hour.  You have severe headaches that do not go away with medicine.  You  Document Released: 11/07/2001 Document Revised: 11/18/2013 Document Reviewed: 01/14/2013 [employer] Patient Information 2015 [employer]. This information is not intended to replace advice given to you by your health care provider. Make sure you discuss any questions you have with your health care provider.     

## 2015-08-24 LAB — PRESCRIPTION MONITORING PROFILE (19 PANEL)
Amphetamine/Meth: NEGATIVE ng/mL
BARBITURATE SCREEN, URINE: NEGATIVE ng/mL
BENZODIAZEPINE SCREEN, URINE: NEGATIVE ng/mL
BUPRENORPHINE, URINE: NEGATIVE ng/mL
COCAINE METABOLITES: NEGATIVE ng/mL
CREATININE, URINE: 152.07 mg/dL (ref >20.0)
Cannabinoid Scrn, Ur: NEGATIVE ng/mL
Carisoprodol, Urine: NEGATIVE ng/mL
Fentanyl, Ur: NEGATIVE ng/mL
MDMA URINE: NEGATIVE ng/mL
METHAQUALONE SCREEN (URINE): NEGATIVE ng/mL
Meperidine, Ur: NEGATIVE ng/mL
Methadone Screen, Urine: NEGATIVE ng/mL
Nitrites, Initial: NEGATIVE ug/mL
OPIATE SCREEN, URINE: NEGATIVE ng/mL
Oxycodone Screen, Ur: NEGATIVE ng/mL
PH URINE, INITIAL: 5.8 pH (ref 4.5–8.9)
Phencyclidine, Ur: NEGATIVE ng/mL
Propoxyphene: NEGATIVE ng/mL
TRAMADOL UR: NEGATIVE ng/mL
Tapentadol, urine: NEGATIVE ng/mL
ZOLPIDEM, URINE: NEGATIVE ng/mL

## 2015-08-27 ENCOUNTER — Encounter: Payer: Self-pay | Admitting: *Deleted

## 2015-08-27 ENCOUNTER — Ambulatory Visit (HOSPITAL_COMMUNITY)
Admission: RE | Admit: 2015-08-27 | Discharge: 2015-08-27 | Disposition: A | Discharge status: Home / Self Care | Payer: Medicaid Other | Source: Ambulatory Visit | Attending: Nurse Practitioner | Admitting: Nurse Practitioner

## 2015-08-27 ENCOUNTER — Encounter (HOSPITAL_COMMUNITY): Payer: Self-pay

## 2015-08-27 ENCOUNTER — Other Ambulatory Visit (HOSPITAL_COMMUNITY): Payer: Self-pay | Admitting: Obstetrics and Gynecology

## 2015-08-27 VITALS — BP 113/56 | HR 82 | Wt 263.6 lb

## 2015-08-27 DIAGNOSIS — O99213 Obesity complicating pregnancy, third trimester: Secondary | ICD-10-CM

## 2015-08-27 DIAGNOSIS — E669 Obesity, unspecified: Secondary | ICD-10-CM | POA: Diagnosis not present

## 2015-08-27 DIAGNOSIS — O99343 Other mental disorders complicating pregnancy, third trimester: Secondary | ICD-10-CM | POA: Insufficient documentation

## 2015-08-27 DIAGNOSIS — O281 Abnormal biochemical finding on antenatal screening of mother: Secondary | ICD-10-CM | POA: Diagnosis not present

## 2015-08-27 DIAGNOSIS — Z3A32 32 weeks gestation of pregnancy: Secondary | ICD-10-CM

## 2015-08-27 DIAGNOSIS — O365930 Maternal care for other known or suspected poor fetal growth, third trimester, not applicable or unspecified: Secondary | ICD-10-CM | POA: Diagnosis not present

## 2015-08-27 DIAGNOSIS — O289 Unspecified abnormal findings on antenatal screening of mother: Secondary | ICD-10-CM

## 2015-08-27 IMAGING — US US MFM FETAL BPP W/O NON-STRESS
1 series · 13 of 28 positions shown · non-contrast
Comparison: none

[Series 1: us mfm fetal bpp w/o non-stress · 53 acquisitions, 13 frames shown]
[im 2/53]
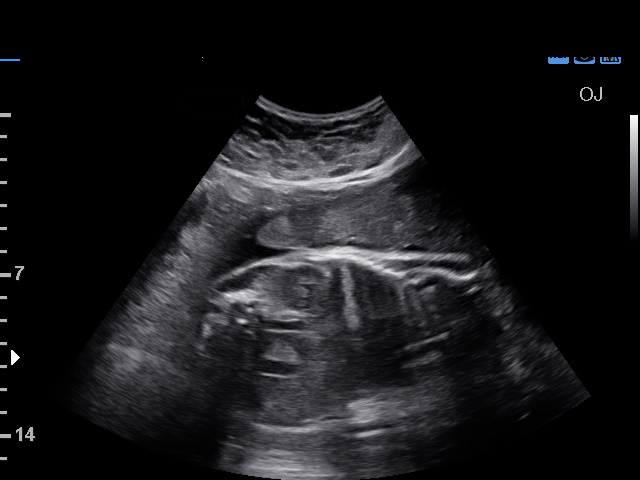
[im 6/53]
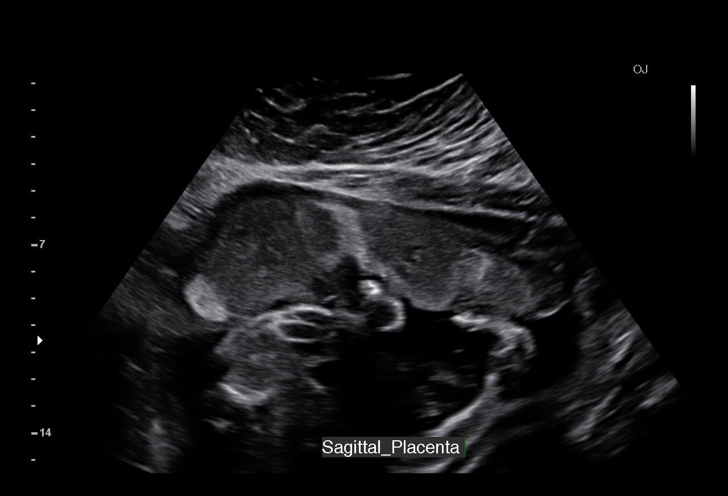
[im 10/53]
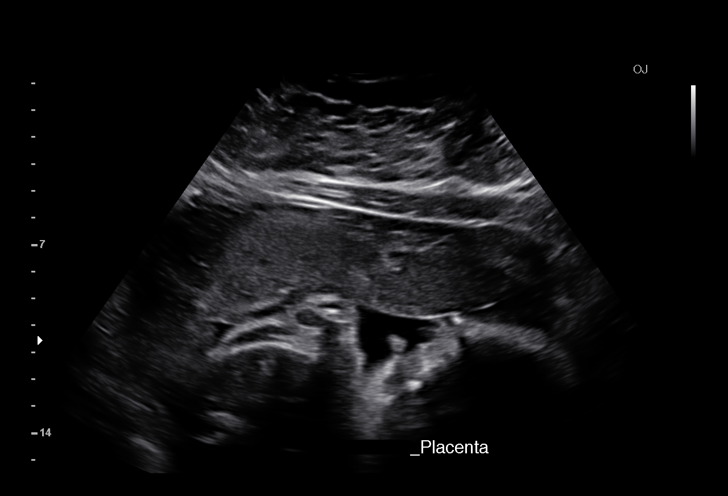
[im 14/53]
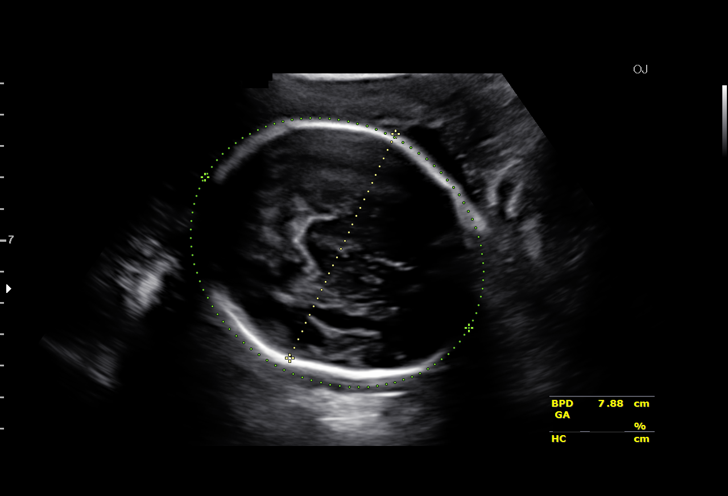
[im 18/53]
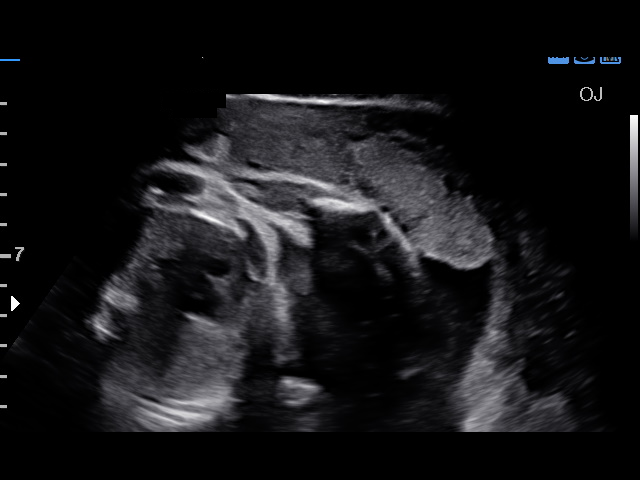
[im 22/53]
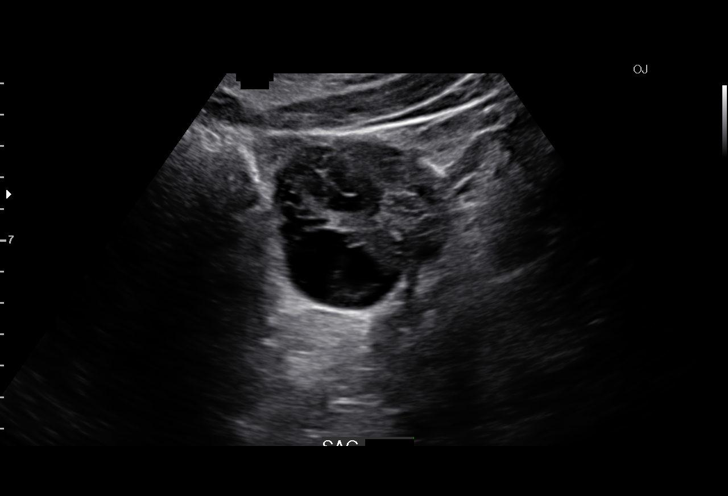
[im 27/53]
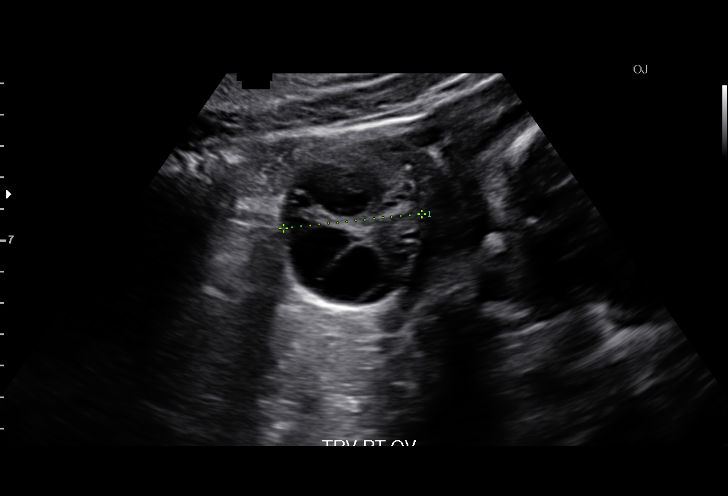
[im 31/53]
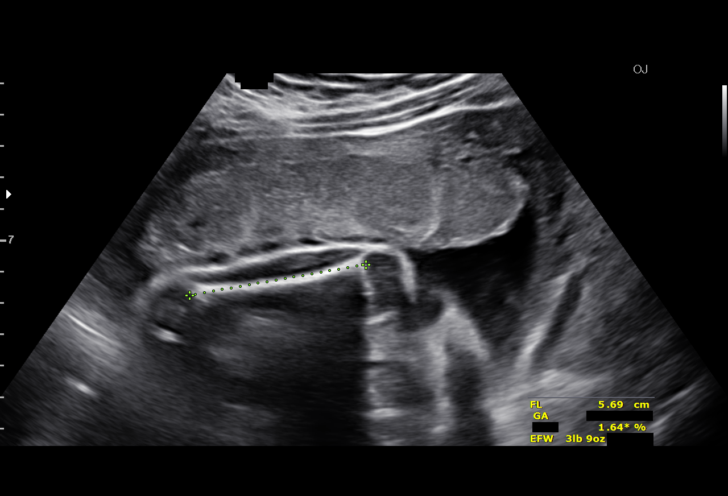
[im 35/53]
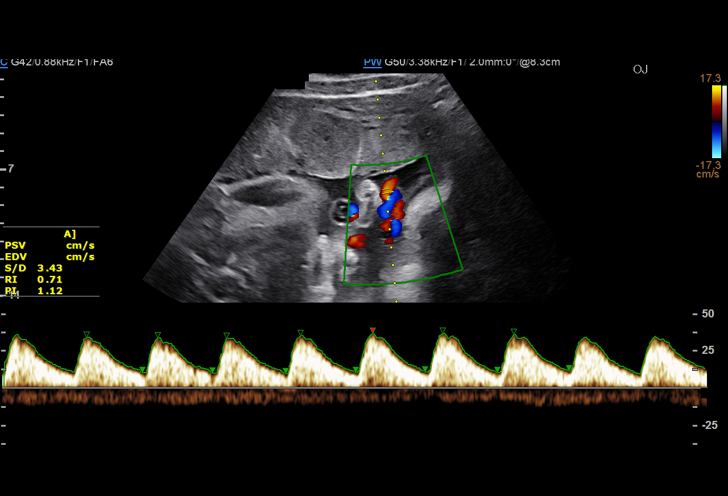
[im 39/53]
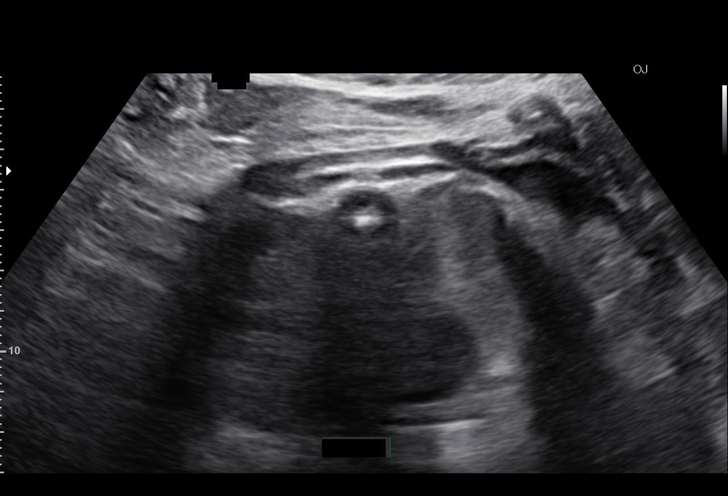
[im 43/53]
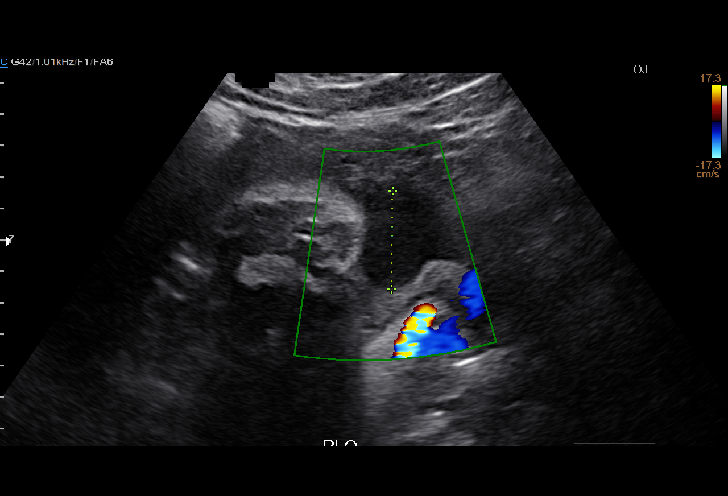
[im 47/53]
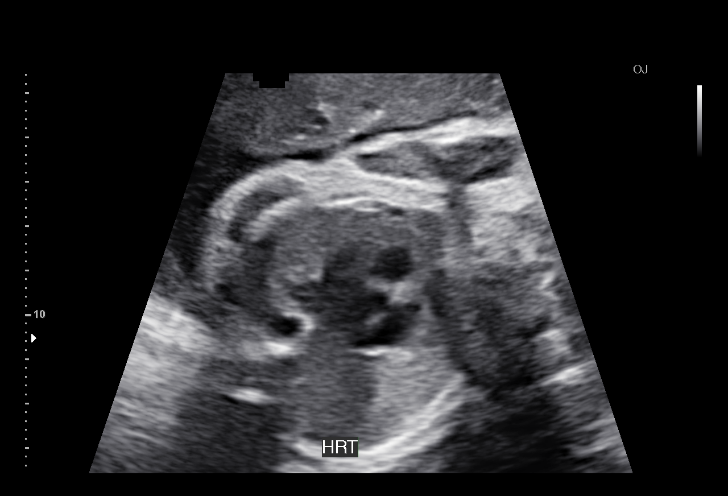
[im 51/53]
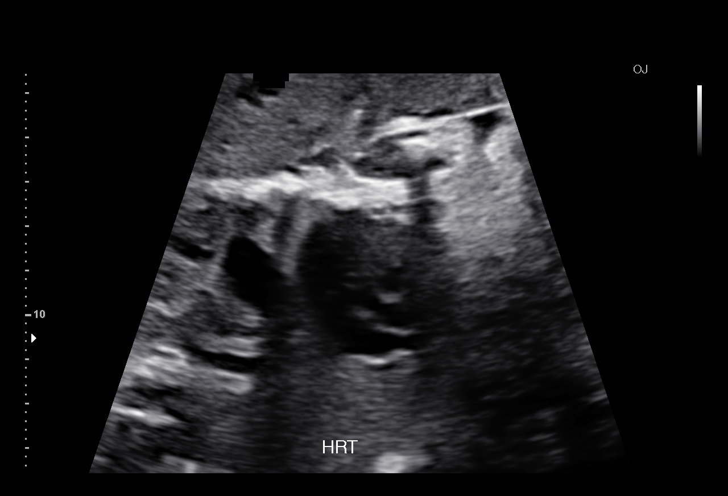

[13 of 28 positions shown; findings below may reference images not displayed]

OBSTETRICS REPORT
(Signed Final [DATE] [DATE])

Name:       FRED JAMES                    Visit  [DATE] [DATE]
Date:

By:                                                      [REDACTED]-
Faculty Physician
Service(s) Provided

US MFM UA CORD DOPPLER                                 76820.02
Indications

32 weeks gestation of pregnancy
Maternal morbid obesity                                [XB] [XB]
Other mental disorder comlplicating pregnancy,         [XB]
unspecified trimester (Major depressive disorder,
suicidal ideation)
Maternal care for known or suspected poor fetal        [XB]
growth, third trimester, not applicable or
unspecified
Abnormal biochemical finding on antenatal              [XB]
screening of FRED JAMES
Fetal Evaluation

Num Of             1
Fetuses:
Fetal Heart        131                          bpm
Rate:
Cardiac Activity:  Observed
Presentation:      Cephalic
Placenta:          Anterior, above cervical
os
P. Cord            Previously Visualized
Insertion:

Amniotic Fluid
AFI FV:      Subjectively within normal limits
AFI Sum:     13.16    cm      41  %Tile     Larg Pckt:    6.81   cm
RUQ:   6.81    cm    LUQ:   3.22    cm   LLQ:    3.13    cm
Biophysical Evaluation

Amniotic F.V:   Within normal limits        F. Tone:        Observed
F. Movement:    Observed                    Score:          [DATE]
F. Breathing:   Observed
Biometry

BPD:       79   m    G. Age:   31w 5d                 CI:        80.66   70 - 86
m
FL/HC:      20.7   19.1 -
21.3
HC:     277.8   m    G. Age:   30w 3d       < 3  %    HC/AC:      1.00   0.96 -
m
AC:     278.3   m    G. Age:   32w 0d        38  %    FL/BPD      72.9   71 - 87
m                                     :
FL:      57.6   m    G. Age:   30w 1d       < 3  %    FL/AC:      20.7   20 - 24
m
HUM:     50.7   m    G. Age:   29w 5d       < 5  %
m

Est.        [XB]   gm   3 lb 13 oz      35   %
FW:
Gestational Age

LMP:           34w 4d        Date:  [DATE]                  EDD:   [DATE]
U/S Today:     31w 0d                                         EDD:   [DATE]
Best:          32w 2d    Det. By:   Early Ultrasound          EDD:   [DATE]
([DATE])
Anatomy

Cranium:          Appears normal         Aortic Arch:       Previously seen
Fetal Cavum:      Previously seen        Ductal Arch:       Previously seen
Ventricles:       Appears normal         Diaphragm:         Appears normal
Choroid Plexus:   Previously seen        Stomach:           Appears normal,
left sided
Cerebellum:       Previously seen        Abdomen:           Previously seen
Posterior         Previously seen        Abdominal          Previously seen
Fossa:                                   Wall:
Nuchal Fold:      Previously seen        Cord Vessels:      Previously seen
Face:             Orbits previously      Kidneys:           Appear normal
seen
Lips:             Previously seen        Bladder:           Appears normal
Palate:           Not well visualized    Spine:             Previously seen
Heart:            Previously seen        Lower              Previously seen
Extremities:
RVOT:             Appears normal         Upper              Previously seen
Extremities:
LVOT:             Appears normal

Other:   Fetus appears to be a female. Heels visualized previously.  5th digit
prev visualized. Technically difficult due to maternal habitus and
fetal position.
Doppler - Fetal Vessels

Umbilical Artery
S/D:   3.2            75   %tile
PSV:      34.25    cm/
s
Umbilical Artery
Absent DFV:     No    Reverse         No
DFV:

Cervix Uterus Adnexa

Cervix:       Not visualized (advanced GA >[XB])

Left Ovary:    Not visualized. No adnexal mass visualized.
Right Ovary:   Within normal limits.
Impression

Single IUP at 32w 2FRED JAMES
FRED JAMES
The overall estimated fetal weight today is at the 35th %tile.
The femurs and humeri measure < 5th %tile, but appear
normal morphologically - likely constitutional
The HC measures < 3rd %tile, but still above 2 SD below
the mean for gestational age (threshold used for
microcepahly)  Normal appearing cranial anatomy.
BPP [DATE]
Normal UA Doppler studies for gestational age
Recommendations

Recommend follow-up ultrasound examination in 3 weeks
for interval growth

FRED JAMES with us.  Please do not hesitate to contact

## 2015-09-06 ENCOUNTER — Ambulatory Visit (INDEPENDENT_AMBULATORY_CARE_PROVIDER_SITE_OTHER): Payer: Medicaid Other | Admitting: Obstetrics & Gynecology

## 2015-09-06 VITALS — BP 111/67 | HR 91 | Temp 98.3°F | Wt 265.0 lb

## 2015-09-06 DIAGNOSIS — O99343 Other mental disorders complicating pregnancy, third trimester: Secondary | ICD-10-CM

## 2015-09-06 DIAGNOSIS — O365930 Maternal care for other known or suspected poor fetal growth, third trimester, not applicable or unspecified: Secondary | ICD-10-CM | POA: Diagnosis present

## 2015-09-06 DIAGNOSIS — E669 Obesity, unspecified: Secondary | ICD-10-CM | POA: Diagnosis not present

## 2015-09-06 DIAGNOSIS — O99213 Obesity complicating pregnancy, third trimester: Secondary | ICD-10-CM | POA: Diagnosis not present

## 2015-09-06 DIAGNOSIS — Z23 Encounter for immunization: Secondary | ICD-10-CM

## 2015-09-06 DIAGNOSIS — R8290 Unspecified abnormal findings in urine: Secondary | ICD-10-CM | POA: Diagnosis not present

## 2015-09-06 DIAGNOSIS — O0993 Supervision of high risk pregnancy, unspecified, third trimester: Secondary | ICD-10-CM | POA: Diagnosis present

## 2015-09-06 DIAGNOSIS — F329 Major depressive disorder, single episode, unspecified: Secondary | ICD-10-CM

## 2015-09-06 DIAGNOSIS — F332 Major depressive disorder, recurrent severe without psychotic features: Secondary | ICD-10-CM

## 2015-09-06 DIAGNOSIS — O289 Unspecified abnormal findings on antenatal screening of mother: Secondary | ICD-10-CM

## 2015-09-06 DIAGNOSIS — F32A Depression, unspecified: Secondary | ICD-10-CM

## 2015-09-06 LAB — POCT URINALYSIS DIP (DEVICE)
Bilirubin Urine: NEGATIVE
Glucose, UA: 100 mg/dL — AB
Ketones, ur: NEGATIVE mg/dL
NITRITE: NEGATIVE
PH: 6 (ref 5.0–8.0)
Protein, ur: 30 mg/dL — AB
SPECIFIC GRAVITY, URINE: 1.025 (ref 1.005–1.030)
Urobilinogen, UA: 1 mg/dL (ref 0.0–1.0)

## 2015-09-06 MED ORDER — SERTRALINE HCL 50 MG PO TABS: 50.0000 mg | ORAL_TABLET | Freq: Every day | ORAL | Status: DC

## 2015-09-06 NOTE — Progress Notes (Signed)
Subjective:  Alexandra Gray is a 24 y.o. G1P0 at [redacted]w[redacted]d being seen today for ongoing prenatal care.  Patient reports depressed mood. Has been seen by SW, has history of MDD.  Denies current SI/HI, desires medication.  Contractions: Irritability.  Vag. Bleeding: None. Movement: Present. Denies leaking of fluid.   The following portions of the patient's history were reviewed and updated as appropriate: allergies, current medications, past family history, past medical history, past social history, past surgical history and problem list.   Objective:   Filed Vitals:   09/06/15 1036  BP: 111/67  Pulse: 91  Temp: 98.3 F (36.8 C)  Weight: 265 lb (120.203 kg)    Fetal Status: Fetal Heart Rate (bpm): 142 Fundal Height: 34 cm Movement: Present     General:  Alert, oriented and cooperative. Patient is in no acute distress.  Skin: Skin is warm and dry. No rash noted.   Cardiovascular: Normal heart rate noted  Respiratory: Normal respiratory effort, no problems with respiration noted  Abdomen: Soft, gravid, appropriate for gestational age. Pain/Pressure: Present     Pelvic: Vag. Bleeding: None     Cervical exam deferred        Extremities: Normal range of motion.  Edema: Trace  Mental Status: Normal mood and affect. Normal behavior. Normal judgment and thought content.   Urinalysis: Urine Protein: 1+ Urine Glucose: 1+  Large LE  Assessment and Plan:  Pregnancy: G1P0 at [redacted]w[redacted]d  1. Depression affecting pregnancy in third trimester, antepartum 2. MDD (major depressive disorder), recurrent severe, without psychosis (HCC) Already talked to SW. Patient desires Zoloft, rare risk of PPHN discussed.  Will continue to observe. - sertraline (ZOLOFT) 50 MG tablet; Take 1 tablet (50 mg total) by mouth daily.  Dispense: 30 tablet; Refill: 5  3. Abnormal finding on antenatal screening of mother Low PAPPA , continue weekly BPP at MFM  4. Abnormal urinalysis - Culture, OB Urine  5. Supervision of  high risk pregnancy, antepartum, third trimester  Preterm labor symptoms and general obstetric precautions including but not limited to vaginal bleeding, contractions, leaking of fluid and fetal movement were reviewed in detail with the patient. Please refer to After Visit Summary for other counseling recommendations.  Return in about 2 weeks (around 09/20/2015) for OB Visit.   Tereso Newcomer, MD

## 2015-09-06 NOTE — Patient Instructions (Signed)
Return to clinic for any obstetric concerns or go to MAU for evaluation  

## 2015-09-06 NOTE — Progress Notes (Signed)
Reviewed tip of week with the patient

## 2015-09-07 ENCOUNTER — Encounter (HOSPITAL_COMMUNITY): Payer: Self-pay | Admitting: *Deleted

## 2015-09-07 ENCOUNTER — Inpatient Hospital Stay (HOSPITAL_COMMUNITY): Payer: Medicaid Other

## 2015-09-07 ENCOUNTER — Inpatient Hospital Stay (HOSPITAL_COMMUNITY)
Admission: AD | Admit: 2015-09-07 | Discharge: 2015-09-07 | Disposition: A | Discharge status: Home / Self Care | Payer: Medicaid Other | Source: Ambulatory Visit | Attending: Family Medicine | Admitting: Family Medicine

## 2015-09-07 DIAGNOSIS — G4730 Sleep apnea, unspecified: Secondary | ICD-10-CM | POA: Insufficient documentation

## 2015-09-07 DIAGNOSIS — A5901 Trichomonal vulvovaginitis: Secondary | ICD-10-CM | POA: Insufficient documentation

## 2015-09-07 DIAGNOSIS — Z3A33 33 weeks gestation of pregnancy: Secondary | ICD-10-CM | POA: Diagnosis not present

## 2015-09-07 DIAGNOSIS — R109 Unspecified abdominal pain: Secondary | ICD-10-CM | POA: Diagnosis not present

## 2015-09-07 DIAGNOSIS — O9989 Other specified diseases and conditions complicating pregnancy, childbirth and the puerperium: Secondary | ICD-10-CM

## 2015-09-07 DIAGNOSIS — O133 Gestational [pregnancy-induced] hypertension without significant proteinuria, third trimester: Secondary | ICD-10-CM | POA: Diagnosis present

## 2015-09-07 DIAGNOSIS — F329 Major depressive disorder, single episode, unspecified: Secondary | ICD-10-CM | POA: Diagnosis not present

## 2015-09-07 DIAGNOSIS — R102 Pelvic and perineal pain: Secondary | ICD-10-CM | POA: Diagnosis not present

## 2015-09-07 DIAGNOSIS — O99343 Other mental disorders complicating pregnancy, third trimester: Secondary | ICD-10-CM | POA: Insufficient documentation

## 2015-09-07 DIAGNOSIS — O98313 Other infections with a predominantly sexual mode of transmission complicating pregnancy, third trimester: Secondary | ICD-10-CM | POA: Insufficient documentation

## 2015-09-07 DIAGNOSIS — Z79899 Other long term (current) drug therapy: Secondary | ICD-10-CM | POA: Diagnosis not present

## 2015-09-07 DIAGNOSIS — O26893 Other specified pregnancy related conditions, third trimester: Secondary | ICD-10-CM | POA: Insufficient documentation

## 2015-09-07 DIAGNOSIS — O288 Other abnormal findings on antenatal screening of mother: Secondary | ICD-10-CM

## 2015-09-07 LAB — URINE MICROSCOPIC-ADD ON

## 2015-09-07 LAB — COMPREHENSIVE METABOLIC PANEL
ALT: 21 U/L (ref 14–54)
ANION GAP: 7 (ref 5–15)
AST: 24 U/L (ref 15–41)
Albumin: 3.2 g/dL — ABNORMAL LOW (ref 3.5–5.0)
Alkaline Phosphatase: 136 U/L — ABNORMAL HIGH (ref 38–126)
BUN: 9 mg/dL (ref 6–20)
CHLORIDE: 106 mmol/L (ref 101–111)
CO2: 22 mmol/L (ref 22–32)
CREATININE: 0.55 mg/dL (ref 0.44–1.00)
Calcium: 9.2 mg/dL (ref 8.9–10.3)
GFR calc non Af Amer: >60 mL/min (ref >60)
Glucose, Bld: 84 mg/dL (ref 65–99)
Potassium: 3.6 mmol/L (ref 3.5–5.1)
SODIUM: 135 mmol/L (ref 135–145)
Total Bilirubin: 0.4 mg/dL (ref 0.3–1.2)
Total Protein: 7.1 g/dL (ref 6.5–8.1)

## 2015-09-07 LAB — FETAL FIBRONECTIN: FETAL FIBRONECTIN: NEGATIVE

## 2015-09-07 LAB — CBC
HCT: 39.4 % (ref 36.0–46.0)
Hemoglobin: 12.9 g/dL (ref 12.0–15.0)
MCH: 27.1 pg (ref 26.0–34.0)
MCHC: 32.7 g/dL (ref 30.0–36.0)
MCV: 82.8 fL (ref 78.0–100.0)
PLATELETS: 224 10*3/uL (ref 150–400)
RBC: 4.76 MIL/uL (ref 3.87–5.11)
RDW: 15.7 % — ABNORMAL HIGH (ref 11.5–15.5)
WBC: 10.3 10*3/uL (ref 4.0–10.5)

## 2015-09-07 LAB — URINALYSIS, ROUTINE W REFLEX MICROSCOPIC
BILIRUBIN URINE: NEGATIVE
GLUCOSE, UA: NEGATIVE mg/dL
Ketones, ur: 15 mg/dL — AB
Nitrite: NEGATIVE
Protein, ur: 30 mg/dL — AB
UROBILINOGEN UA: 1 mg/dL (ref 0.0–1.0)
pH: 6 (ref 5.0–8.0)

## 2015-09-07 LAB — WET PREP, GENITAL
Clue Cells Wet Prep HPF POC: NONE SEEN
WBC, Wet Prep HPF POC: NONE SEEN
YEAST WET PREP: NONE SEEN

## 2015-09-07 LAB — PROTEIN / CREATININE RATIO, URINE
Creatinine, Urine: 296 mg/dL
PROTEIN CREATININE RATIO: 0.18 mg/mg{creat} — AB (ref 0.00–0.15)
TOTAL PROTEIN, URINE: 53 mg/dL

## 2015-09-07 IMAGING — US US MFM FETAL BPP W/O NON-STRESS
1 series · 15 of 24 positions shown · non-contrast
Comparison: none

[Series 1: us mfm fetal bpp w/o non-stress · 24 acquisitions, 15 frames shown]
[im 1/24]
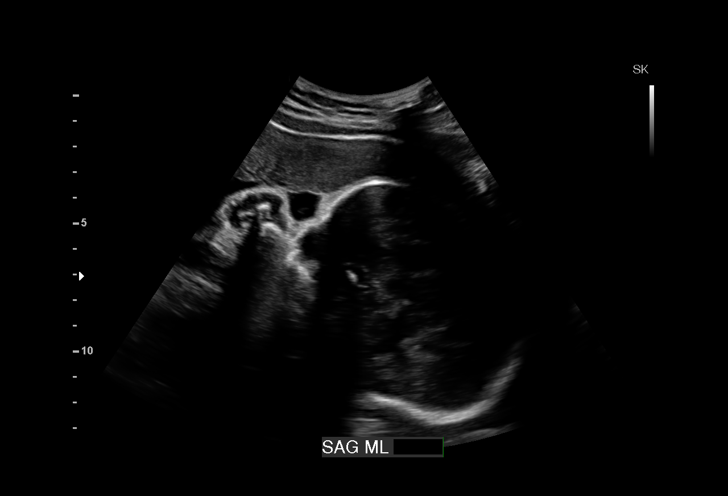
[im 3/24]
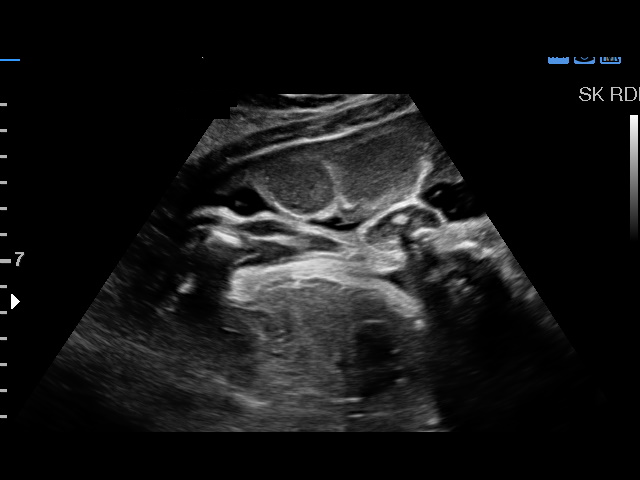
[im 5/24]
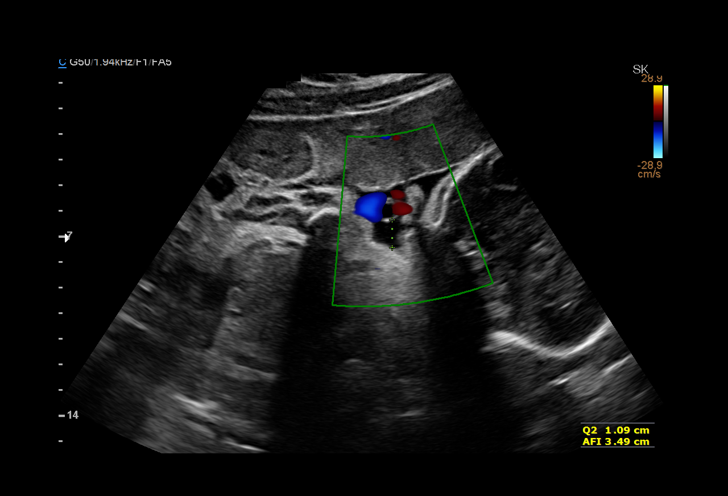
[im 6/24]
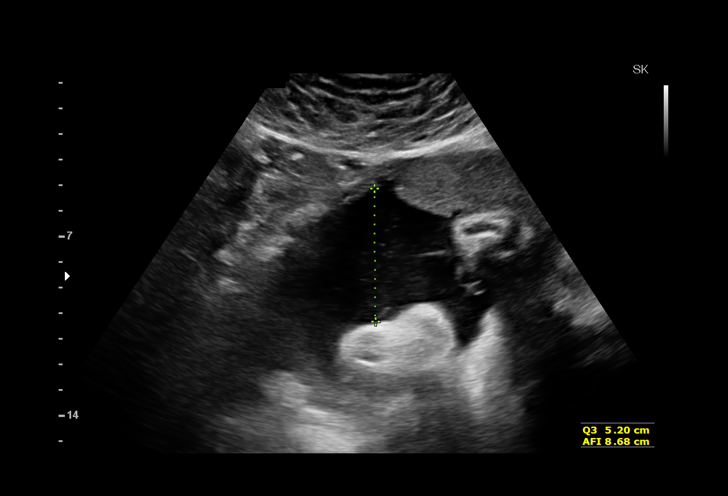
[im 8/24]
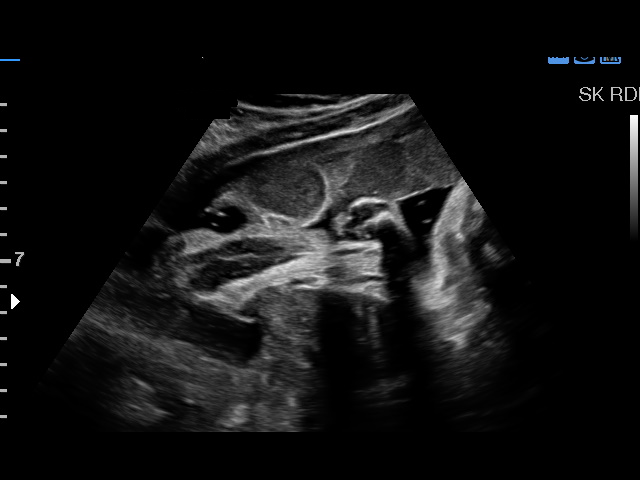
[im 9/24]
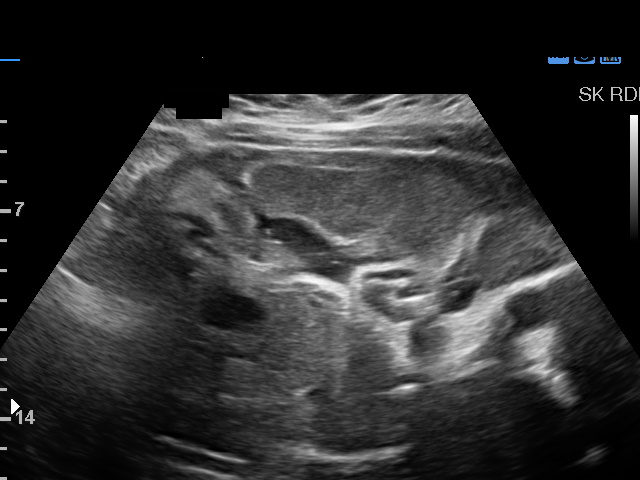
[im 11/24]
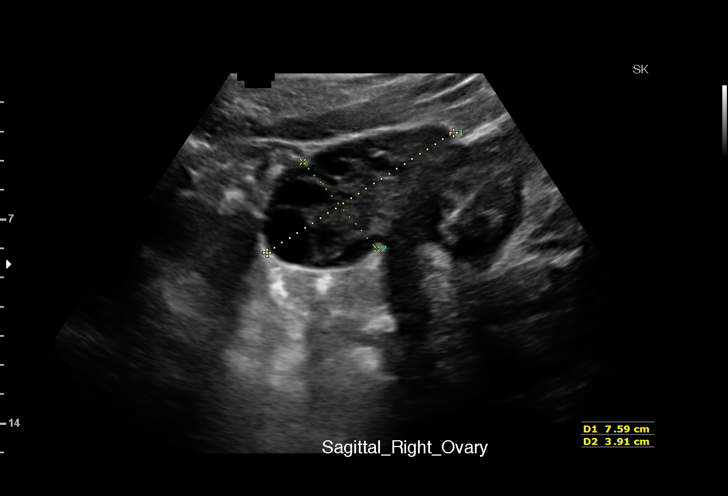
[im 13/24]
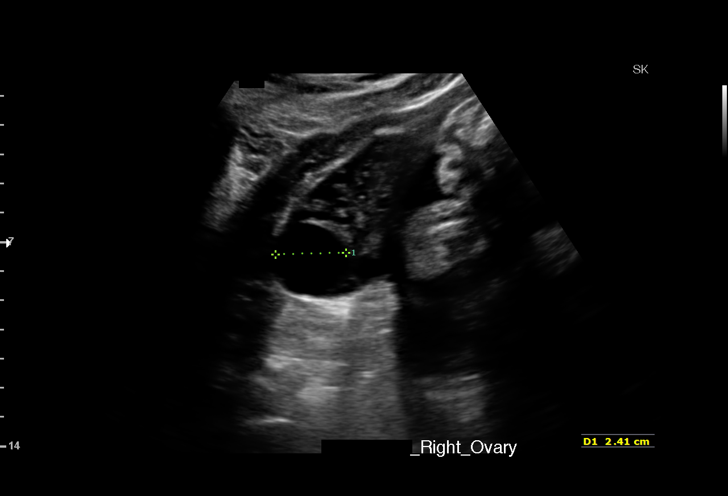
[im 14/24]
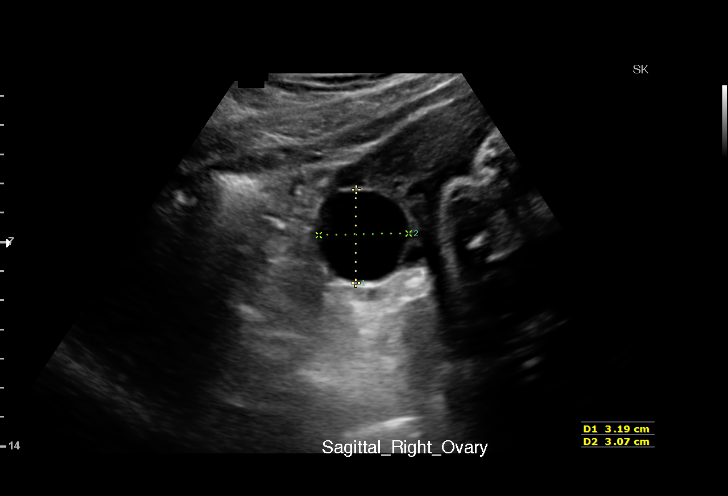
[im 16/24]
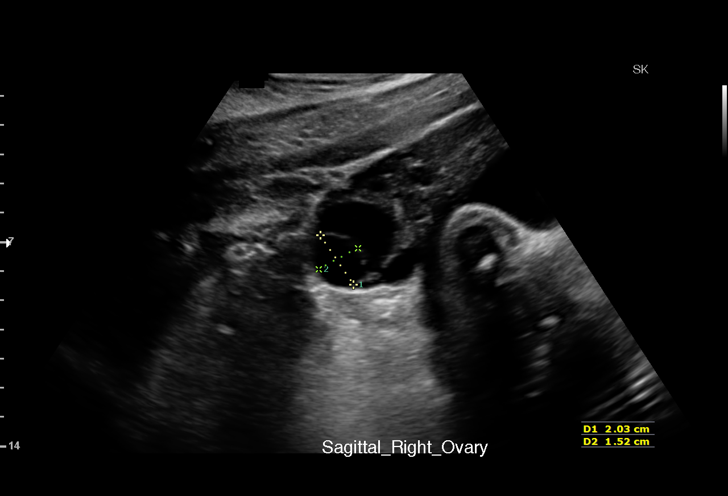
[im 17/24]
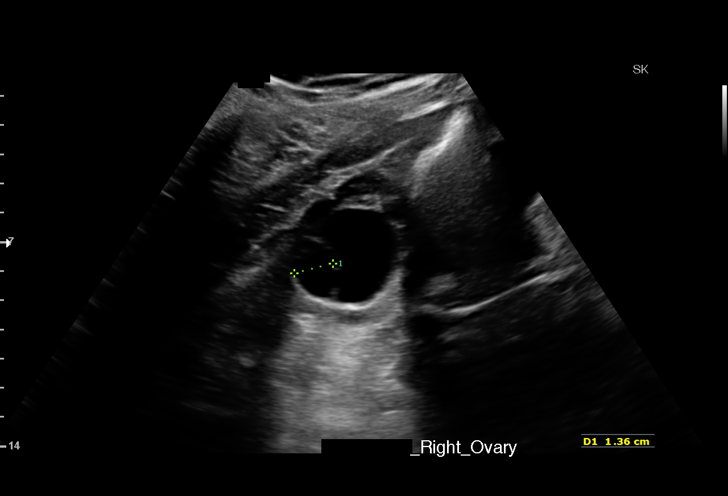
[im 19/24]
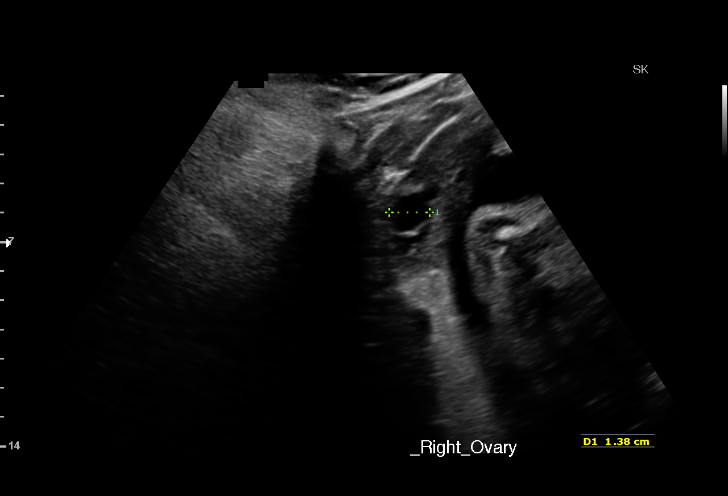
[im 21/24]
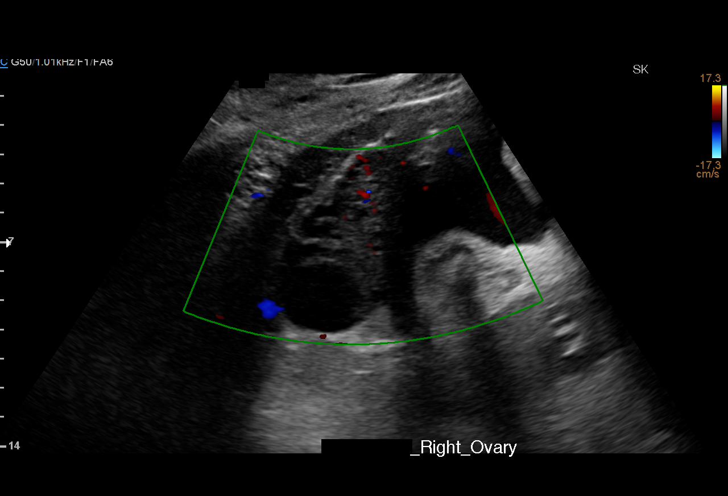
[im 22/24]
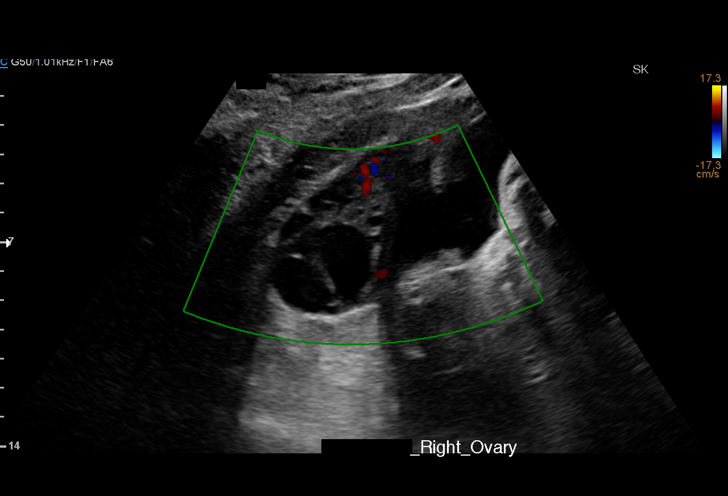
[im 24/24]
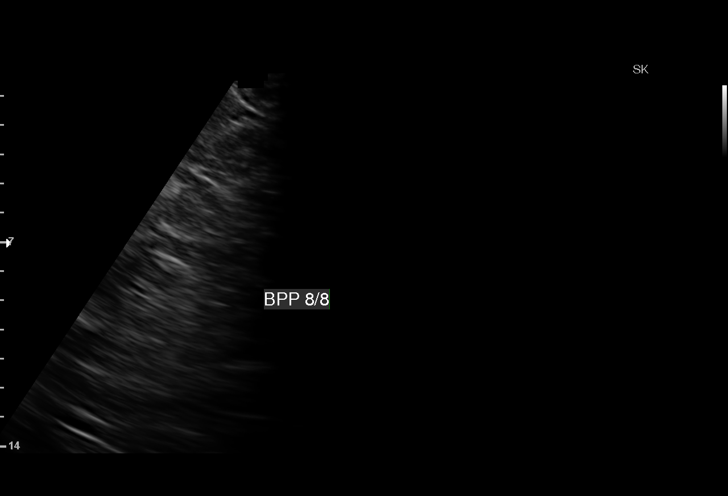

[15 of 24 positions shown; findings below may reference images not displayed]

OBSTETRICS REPORT
(Signed Final [DATE] [DATE])

Name:       ADEPRASSTYO                    Visit  [DATE] [DATE]
Date:

Service(s) Provided

Indications

Non-reactive NST                                       [J4]
Maternal morbid obesity                                [J4] [J4]
Other mental disorder comlplicating pregnancy,         [J4]
unspecified trimester (Major depressive disorder,
suicidal ideation)
Maternal care for known or suspected poor fetal        [J4]
growth, third trimester, not applicable or
unspecified
Abnormal biochemical finding on antenatal              [J4]
screening of ADEPRASSTYO
33 weeks gestation of pregnancy
Fetal Evaluation

Num Of             1
Fetuses:
Fetal Heart        153                          bpm
Rate:
Cardiac Activity:  Observed
Presentation:      Cephalic
Placenta:          Anterior, above cervical
os

Amniotic Fluid
AFI FV:      Subjectively within normal limits
AFI Sum:     10.42    cm      22  %Tile
Biophysical Evaluation

Amniotic F.V:   Within normal limits        F. Tone:        Observed
F. Movement:    Observed                    Score:          [DATE]
F. Breathing:   Observed
Gestational Age

LMP:           36w 1d        Date:  [DATE]                  EDD:   [DATE]
Best:          33w 6d    Det. By:   Early Ultrasound          EDD:   [DATE]
([DATE])
Cervix Uterus Adnexa

Cervix:       Not visualized (advanced GA >[J4])

Left Ovary:    Not visualized.
Right Ovary:   4 simple cysts < 3.2cm
Impression

Singleton intrauterine pregnancy at 33 weeks 6 days
gestation with fetal cardiac activity
Cephalic presentation
BPP [DATE] with an dAFI > 10cm
Recommendations

Follow-up ultrasounds as clinically indicated.

ADEPRASSTYO with us.  Please do not hesitate to contact

## 2015-09-07 MED ORDER — METRONIDAZOLE 500 MG PO TABS
2000.0000 mg | ORAL_TABLET | Freq: Once | ORAL | Status: AC
  Administered 2015-09-07: 2000 mg via ORAL
  Filled 2015-09-07: qty 4

## 2015-09-07 MED ORDER — ACETAMINOPHEN 500 MG PO TABS
1000.0000 mg | ORAL_TABLET | Freq: Once | ORAL | Status: AC
  Administered 2015-09-07: 1000 mg via ORAL
  Filled 2015-09-07: qty 2

## 2015-09-07 MED ORDER — ONDANSETRON HCL 4 MG/2ML IJ SOLN
4.0000 mg | Freq: Once | INTRAMUSCULAR | Status: AC
  Administered 2015-09-07: 4 mg via INTRAVENOUS
  Filled 2015-09-07: qty 2

## 2015-09-07 MED ORDER — LACTATED RINGERS IV BOLUS (SEPSIS)
1000.0000 mL | Freq: Once | INTRAVENOUS | Status: AC
  Administered 2015-09-07: 1000 mL via INTRAVENOUS

## 2015-09-07 MED ORDER — NIFEDIPINE 10 MG PO CAPS
10.0000 mg | ORAL_CAPSULE | ORAL | Status: DC | PRN
  Administered 2015-09-07: 10 mg via ORAL
  Filled 2015-09-07: qty 1

## 2015-09-07 NOTE — MAU Note (Signed)
Started spotting around 1, bright red.  Having a lot of cramping, constant urge to pee, but nothing comes out, lot of pressure.

## 2015-09-07 NOTE — Discharge Instructions (Signed)
No intercourse for 7 days!!    Trichomoniasis Trichomoniasis is an infection caused by an organism called Trichomonas. The infection can affect both women and men. In women, the outer female genitalia and the vagina are affected. In men, the penis is mainly affected, but the prostate and other reproductive organs can also be involved. Trichomoniasis is a sexually transmitted infection (STI) and is most often passed to another person through sexual contact.  RISK FACTORS  Having unprotected sexual intercourse.  Having sexual intercourse with an infected partner. SIGNS AND SYMPTOMS  Symptoms of trichomoniasis in women include:  Abnormal gray-green frothy vaginal discharge.  Itching and irritation of the vagina.  Itching and irritation of the area outside the vagina. Symptoms of trichomoniasis in men include:   Penile discharge with or without pain.  Pain during urination. This results from inflammation of the urethra. DIAGNOSIS  Trichomoniasis may be found during a Pap test or physical exam. Your health care provider may use one of the following methods to help diagnose this infection:  Testing the pH of the vagina with a test tape.  Using a vaginal swab test that checks for the Trichomonas organism. A test is available that provides results within a few minutes.  Examining a urine sample.  Testing vaginal secretions. Your health care provider may test you for other STIs, including HIV. TREATMENT   You may be given medicine to fight the infection. Women should inform their health care provider if they could be or are pregnant. Some medicines used to treat the infection should not be taken during pregnancy.  Your health care provider may recommend over-the-counter medicines or creams to decrease itching or irritation.  Your sexual partner will need to be treated if infected.  Your health care provider may test you for infection again 3 months after treatment. HOME CARE  INSTRUCTIONS   Take medicines only as directed by your health care provider.  Take over-the-counter medicine for itching or irritation as directed by your health care provider.  Do not have sexual intercourse while you have the infection.  Women should not douche or wear tampons while they have the infection.  Discuss your infection with your partner. Your partner may have gotten the infection from you, or you may have gotten it from your partner.  Have your sex partner get examined and treated if necessary.  Practice safe, informed, and protected sex.  See your health care provider for other STI testing. SEEK MEDICAL CARE IF:   You still have symptoms after you finish your medicine.  You develop abdominal pain.  You have pain when you urinate.  You have bleeding after sexual intercourse.  You develop a rash.  Your medicine makes you sick or makes you throw up (vomit). MAKE SURE YOU:  Understand these instructions.  Will watch your condition.  Will get help right away if you are not doing well or get worse.   This information is not intended to replace advice given to you by your health care provider. Make sure you discuss any questions you have with your health care provider.   Document Released: 05/09/2001 Document Revised: 12/04/2014 Document Reviewed: 08/25/2013 Elsevier Interactive Patient Education 2016 [employer].    Preterm Labor Information Preterm labor is when labor starts at less than 37 weeks of pregnancy. The normal length of a pregnancy is 39 to 41 weeks. CAUSES Often, there is no identifiable underlying cause as to why a woman goes into preterm labor. One of the most common  known causes of preterm labor is infection. Infections of the uterus, cervix, vagina, amniotic sac, bladder, kidney, or even the lungs (pneumonia) can cause labor to start. Other suspected causes of preterm labor include:   Urogenital infections, such as yeast infections and  bacterial vaginosis.   Uterine abnormalities (uterine shape, uterine septum, fibroids, or bleeding from the placenta).   A cervix that has been operated on (it may fail to stay closed).   Malformations in the fetus.   Multiple gestations (twins, triplets, and so on).   Breakage of the amniotic sac.  RISK FACTORS  Having a previous history of preterm labor.   Having premature rupture of membranes (PROM).   Having a placenta that covers the opening of the cervix (placenta previa).   Having a placenta that separates from the uterus (placental abruption).   Having a cervix that is too weak to hold the fetus in the uterus (incompetent cervix).   Having too much fluid in the amniotic sac (polyhydramnios).   Taking illegal drugs or smoking while pregnant.   Not gaining enough weight while pregnant.   Being younger than 36 and older than 24 years old.   Having a low socioeconomic status.   Being African American. SYMPTOMS Signs and symptoms of preterm labor include:   Menstrual-like cramps, abdominal pain, or back pain.  Uterine contractions that are regular, as frequent as six in an hour, regardless of their intensity (may be mild or painful).  Contractions that start on the top of the uterus and spread down to the lower abdomen and back.   A sense of increased pelvic pressure.   A watery or bloody mucus discharge that comes from the vagina.  TREATMENT Depending on the length of the pregnancy and other circumstances, your health care provider may suggest bed rest. If necessary, there are medicines that can be given to stop contractions and to mature the fetal lungs. If labor happens before 34 weeks of pregnancy, a prolonged hospital stay may be recommended. Treatment depends on the condition of both you and the fetus.  WHAT SHOULD YOU DO IF YOU THINK YOU ARE IN PRETERM LABOR? Call your health care provider right away. You will need to go to the hospital to  get checked immediately. HOW CAN YOU PREVENT PRETERM LABOR IN FUTURE PREGNANCIES? You should:   Stop smoking if you smoke.  Maintain healthy weight gain and avoid chemicals and drugs that are not necessary.  Be watchful for any type of infection.  Inform your health care provider if you have a known history of preterm labor.   This information is not intended to replace advice given to you by your health care provider. Make sure you discuss any questions you have with your health care provider.   Document Released: 02/03/2004 Document Revised: 07/16/2013 Document Reviewed: 12/16/2012 Elsevier Interactive Patient Education [employer]

## 2015-09-07 NOTE — MAU Provider Note (Signed)
Chief Complaint:  Vaginal Bleeding; Abdominal Pain; and Dysuria  First Provider Initiated Contact with Patient 09/07/15 1926     HPI: Alexandra Gray is a 24 y.o. G1P0 at [redacted]w[redacted]d who presents to maternity admissions reporting abdominal cramping, pressure since yesterday and bright red vaginal spotting since 1 PM today. Frequent urge to urinate, but nothing comes out. UA in [facility] outpatient clinic yesterday positive for leukocytes and hemoglobin. Urine culture pending. Pressure increases intermittently. Patient is unsure if she's having contractions, but feels the sensation several times per hour.  Blood type A pos, anterior placenta w/out previa.   Location: Pelvic Quality: Pressure Severity: 10/10 in pain scale Duration: 24 hours Context: None Timing: Constant but worsens intermittently Modifying factors: Hasn't tried anything. No recent intercourse. Associated signs and symptoms: Negative for fever, chills, vaginal discharge, leaking of fluid. Doesn't think she's having hematuria or rectal bleeding.  Denies contractions, leakage of fluid or vaginal bleeding. Good fetal movement.   Pregnancy Course:  Low PAPP-A. Fetal growth restriction. In antenatal testing. Last growth ultrasound performed 08/27/2015 at 32 weeks: Estimated fetal weight 3 lbs. 13 oz. (35th percentile), but head circumference less than 3rd percentile, femur length less than 5th percentile, humerus less than 3rd percentile. Doesn't meet criteria for microcephaly. Possible constitutional SGA.  Past Medical History: Past Medical History  Diagnosis Date  . Mental disorder   . Depression   . Sleep apnea   . PID (pelvic inflammatory disease)   . Gonorrhea   . Chlamydia     Past obstetric history: OB History  Gravida Para Term Preterm AB SAB TAB Ectopic Multiple Living  1         0    # Outcome Date GA Lbr Len/2nd Weight Sex Delivery Anes PTL Lv  1 Current               Past Surgical History: Past  Surgical History  Procedure Laterality Date  . Knee surgery       Family History: History reviewed. No pertinent family history.  Social History: Social History  Substance Use Topics  . Smoking status: Never Smoker   . Smokeless tobacco: Never Used  . Alcohol Use: No    Allergies: No Known Allergies  Meds:  Prescriptions prior to admission  Medication Sig Dispense Refill Last Dose  . omeprazole (PRILOSEC) 20 MG capsule Take 1 capsule (20 mg total) by mouth daily. (Patient taking differently: Take 20 mg by mouth daily as needed. ) 30 capsule 8 Taking  . Prenatal Multivit-Min-Fe-FA (PRENATAL VITAMINS) 0.8 MG tablet Take 1 tablet by mouth daily. 30 tablet 12 Taking  . promethazine (PHENERGAN) 25 MG tablet Take 1 tablet (25 mg total) by mouth every 6 (six) hours as needed for nausea or vomiting. 30 tablet 1 Taking  . sertraline (ZOLOFT) 50 MG tablet Take 1 tablet (50 mg total) by mouth daily. 30 tablet 5     I have reviewed patient's Past Medical Hx, Surgical Hx, Family Hx, Social Hx, medications and allergies.   ROS:  Review of Systems  Constitutional: Negative for fever and chills.  Gastrointestinal: Positive for abdominal pain. Negative for nausea, vomiting, diarrhea and constipation.  Genitourinary: Positive for urgency, frequency, vaginal bleeding, difficulty urinating and pelvic pain. Negative for dysuria, hematuria, flank pain, vaginal discharge and vaginal pain.  Musculoskeletal: Positive for back pain.    Physical Exam   Patient Vitals for the past 24 hrs:  BP Temp Temp src Pulse Resp  09/07/15 1911 139/88  mmHg - - 82 -  09/07/15 1901 139/93 mmHg - - 90 -  09/07/15 1857 112/82 mmHg - - 106 -  09/07/15 1855 134/85 mmHg - - 92 -  09/07/15 1840 144/87 mmHg 98.5 F (36.9 C) Oral 87 20   Constitutional: Well-developed, well-nourished, morbidly obese female in mild distress.  Cardiovascular: normal rate Respiratory: normal effort GI: Abd soft, non-tender, gravid  appropriate for gestational age.  MS: Extremities nontender, no edema, normal ROM Neurologic: Alert and oriented x 4.  GU: Neg CVAT.  Pelvic: NEFG, small amount of thick, yellow, mildly malodorous discharge, no blood, cervix ery vascular-appearing, but no bleeding w/ pelvic exam. No CMT    cervix long/closed/-3, soft, posterior  FHT:  Baseline 145 , min-moderate variability, 10x10 accelerations present, ? Decelerations vs baseline in btw baseline. Toco adjusted to trace UC's better Contractions: sporadic, but may not be tracing well due to maternal body habitus.    Labs: Results for orders placed or performed during the hospital encounter of 09/07/15 (from the past 24 hour(s))  Urinalysis, Routine w reflex microscopic (not at [facility])     Status: Abnormal   Collection Time: 09/07/15  6:40 PM  Result Value Ref Range   Color, Urine YELLOW YELLOW   APPearance HAZY (A) CLEAR   Specific Gravity, Urine >1.030 (H) 1.005 - 1.030   pH 6.0 5.0 - 8.0   Glucose, UA NEGATIVE NEGATIVE mg/dL   Hgb urine dipstick TRACE (A) NEGATIVE   Bilirubin Urine NEGATIVE NEGATIVE   Ketones, ur 15 (A) NEGATIVE mg/dL   Protein, ur 30 (A) NEGATIVE mg/dL   Urobilinogen, UA 1.0 0.0 - 1.0 mg/dL   Nitrite NEGATIVE NEGATIVE   Leukocytes, UA MODERATE (A) NEGATIVE  Urine microscopic-add on     Status: Abnormal   Collection Time: 09/07/15  6:40 PM  Result Value Ref Range   Squamous Epithelial / LPF FEW (A) RARE   WBC, UA 7-10 <3 WBC/hpf   RBC / HPF 0-2 <3 RBC/hpf   Bacteria, UA FEW (A) RARE   Casts GRANULAR CAST (A) NEGATIVE   Urine-Other MUCOUS PRESENT   Fetal fibronectin     Status: None   Collection Time: 09/07/15  7:40 PM  Result Value Ref Range   Fetal Fibronectin NEGATIVE NEGATIVE  Wet prep, genital     Status: Abnormal   Collection Time: 09/07/15  7:40 PM  Result Value Ref Range   Yeast Wet Prep HPF POC NONE SEEN NONE SEEN   Trich, Wet Prep FEW (A) NONE SEEN   Clue Cells Wet Prep HPF POC NONE SEEN NONE SEEN    WBC, Wet Prep HPF POC NONE SEEN NONE SEEN  CBC     Status: Abnormal   Collection Time: 09/07/15  8:26 PM  Result Value Ref Range   WBC 10.3 4.0 - 10.5 K/uL   RBC 4.76 3.87 - 5.11 MIL/uL   Hemoglobin 12.9 12.0 - 15.0 g/dL   HCT 16.1 09.6 - 04.5 %   MCV 82.8 78.0 - 100.0 fL   MCH 27.1 26.0 - 34.0 pg   MCHC 32.7 30.0 - 36.0 g/dL   RDW 40.9 (H) 81.1 - 91.4 %   Platelets 224 150 - 400 K/uL   MAU Course: UA, wet prep, GC/chlamydia, LR bolus, Procardia, fetal fibronectin, scan bladder.  Care of patient turned over to Judeth Horn, NP at 8:15 PM.  Dorathy Kinsman, CNM 09/07/2015 8:43 PM   -FFN negative -Non reactive fetal tracing - BPP ordered -BPP 8/8 -No contractions  on monitor, mild UI -Trich on wet prep - Flagyl 2gm given - Pt states she is no longer in touch with previous partner - states she will not see him to give him rx. Encouraged her to contact him & inform him that he needs to be treated.  -[id number]- S/w Dr. Shawnie Pons regarding fetal tracing - FHT reviewed by Dr. Shawnie Pons   A: 1. Trichomoniasis of vagina   2. Non-reactive NST (non-stress test)   3. Pelvic pain in pregnancy, antepartum, third trimester   4. Transient hypertension of pregnancy in third trimester    P: Discharge home Keep f/u appts for ultrasound & prenatal care Preterm labor precautions No intercourse x 1 week  Judeth Horn, NP 09/07/2015 9:38 PM

## 2015-09-08 LAB — CULTURE, OB URINE: Colony Count: 80000

## 2015-09-08 LAB — GC/CHLAMYDIA PROBE AMP (~~LOC~~) NOT AT ARMC
Chlamydia: NEGATIVE
Neisseria Gonorrhea: NEGATIVE

## 2015-09-09 LAB — CULTURE, OB URINE: SPECIAL REQUESTS: NORMAL

## 2015-09-16 ENCOUNTER — Encounter: Payer: Self-pay | Admitting: Obstetrics and Gynecology

## 2015-09-17 ENCOUNTER — Other Ambulatory Visit (HOSPITAL_COMMUNITY): Payer: Self-pay | Admitting: Maternal and Fetal Medicine

## 2015-09-17 ENCOUNTER — Ambulatory Visit (HOSPITAL_COMMUNITY)
Admission: RE | Admit: 2015-09-17 | Discharge: 2015-09-17 | Disposition: A | Discharge status: Home / Self Care | Payer: Medicaid Other | Source: Ambulatory Visit | Attending: Nurse Practitioner | Admitting: Nurse Practitioner

## 2015-09-17 VITALS — BP 137/88 | HR 83 | Wt 266.0 lb

## 2015-09-17 DIAGNOSIS — O99343 Other mental disorders complicating pregnancy, third trimester: Secondary | ICD-10-CM

## 2015-09-17 DIAGNOSIS — O365930 Maternal care for other known or suspected poor fetal growth, third trimester, not applicable or unspecified: Secondary | ICD-10-CM | POA: Diagnosis not present

## 2015-09-17 DIAGNOSIS — O281 Abnormal biochemical finding on antenatal screening of mother: Secondary | ICD-10-CM

## 2015-09-17 DIAGNOSIS — Z3A35 35 weeks gestation of pregnancy: Secondary | ICD-10-CM

## 2015-09-17 DIAGNOSIS — O99213 Obesity complicating pregnancy, third trimester: Secondary | ICD-10-CM | POA: Diagnosis present

## 2015-09-17 DIAGNOSIS — IMO0002 Reserved for concepts with insufficient information to code with codable children: Secondary | ICD-10-CM

## 2015-09-17 DIAGNOSIS — O289 Unspecified abnormal findings on antenatal screening of mother: Secondary | ICD-10-CM

## 2015-09-19 ENCOUNTER — Encounter (HOSPITAL_COMMUNITY): Payer: Self-pay | Admitting: *Deleted

## 2015-09-19 ENCOUNTER — Encounter: Payer: Self-pay | Admitting: Obstetrics and Gynecology

## 2015-09-19 ENCOUNTER — Inpatient Hospital Stay (HOSPITAL_COMMUNITY): Payer: Medicaid Other

## 2015-09-19 ENCOUNTER — Inpatient Hospital Stay (HOSPITAL_COMMUNITY)
Admission: AD | Admit: 2015-09-19 | Discharge: 2015-09-19 | Disposition: A | Discharge status: Home / Self Care | Payer: Medicaid Other | Source: Ambulatory Visit | Attending: Obstetrics and Gynecology | Admitting: Obstetrics and Gynecology

## 2015-09-19 DIAGNOSIS — Z79899 Other long term (current) drug therapy: Secondary | ICD-10-CM | POA: Diagnosis not present

## 2015-09-19 DIAGNOSIS — R102 Pelvic and perineal pain: Secondary | ICD-10-CM | POA: Diagnosis not present

## 2015-09-19 DIAGNOSIS — O4693 Antepartum hemorrhage, unspecified, third trimester: Secondary | ICD-10-CM

## 2015-09-19 DIAGNOSIS — O26893 Other specified pregnancy related conditions, third trimester: Secondary | ICD-10-CM | POA: Diagnosis not present

## 2015-09-19 DIAGNOSIS — O468X3 Other antepartum hemorrhage, third trimester: Secondary | ICD-10-CM | POA: Diagnosis present

## 2015-09-19 DIAGNOSIS — O163 Unspecified maternal hypertension, third trimester: Secondary | ICD-10-CM

## 2015-09-19 DIAGNOSIS — A5901 Trichomonal vulvovaginitis: Secondary | ICD-10-CM | POA: Diagnosis not present

## 2015-09-19 DIAGNOSIS — O23593 Infection of other part of genital tract in pregnancy, third trimester: Secondary | ICD-10-CM | POA: Insufficient documentation

## 2015-09-19 DIAGNOSIS — Z3A35 35 weeks gestation of pregnancy: Secondary | ICD-10-CM | POA: Insufficient documentation

## 2015-09-19 DIAGNOSIS — O288 Other abnormal findings on antenatal screening of mother: Secondary | ICD-10-CM

## 2015-09-19 HISTORY — DX: Essential (primary) hypertension: I10

## 2015-09-19 HISTORY — DX: Unspecified ovarian cyst, unspecified side: N83.209

## 2015-09-19 LAB — URINALYSIS, ROUTINE W REFLEX MICROSCOPIC
Bilirubin Urine: NEGATIVE
Glucose, UA: NEGATIVE mg/dL
KETONES UR: 15 mg/dL — AB
NITRITE: NEGATIVE
Protein, ur: 30 mg/dL — AB
UROBILINOGEN UA: 1 mg/dL (ref 0.0–1.0)
pH: 6 (ref 5.0–8.0)

## 2015-09-19 LAB — CBC
HCT: 38.7 % (ref 36.0–46.0)
HEMOGLOBIN: 12.7 g/dL (ref 12.0–15.0)
MCH: 27.1 pg (ref 26.0–34.0)
MCHC: 32.8 g/dL (ref 30.0–36.0)
MCV: 82.7 fL (ref 78.0–100.0)
Platelets: 232 10*3/uL (ref 150–400)
RBC: 4.68 MIL/uL (ref 3.87–5.11)
RDW: 15.7 % — ABNORMAL HIGH (ref 11.5–15.5)
WBC: 11.4 10*3/uL — ABNORMAL HIGH (ref 4.0–10.5)

## 2015-09-19 LAB — URINE MICROSCOPIC-ADD ON

## 2015-09-19 LAB — COMPREHENSIVE METABOLIC PANEL
ALK PHOS: 143 U/L — AB (ref 38–126)
ALT: 19 U/L (ref 14–54)
ANION GAP: 9 (ref 5–15)
AST: 23 U/L (ref 15–41)
Albumin: 3 g/dL — ABNORMAL LOW (ref 3.5–5.0)
BILIRUBIN TOTAL: 0.4 mg/dL (ref 0.3–1.2)
BUN: 8 mg/dL (ref 6–20)
CALCIUM: 9 mg/dL (ref 8.9–10.3)
CO2: 22 mmol/L (ref 22–32)
Chloride: 102 mmol/L (ref 101–111)
Creatinine, Ser: 0.59 mg/dL (ref 0.44–1.00)
Glucose, Bld: 96 mg/dL (ref 65–99)
Potassium: 3.6 mmol/L (ref 3.5–5.1)
SODIUM: 133 mmol/L — AB (ref 135–145)
TOTAL PROTEIN: 7 g/dL (ref 6.5–8.1)

## 2015-09-19 LAB — WET PREP, GENITAL
CLUE CELLS WET PREP: NONE SEEN
Yeast Wet Prep HPF POC: NONE SEEN

## 2015-09-19 LAB — PROTEIN / CREATININE RATIO, URINE
CREATININE, URINE: 224 mg/dL
PROTEIN CREATININE RATIO: 0.26 mg/mg{creat} — AB (ref 0.00–0.15)
TOTAL PROTEIN, URINE: 59 mg/dL

## 2015-09-19 IMAGING — US US MFM FETAL BPP W/O NON-STRESS
1 series · 15 of 22 positions shown · non-contrast
Comparison: none

[Series 1: us mfm fetal bpp w/o non-stress · 22 acquisitions, 15 frames shown]
[im 1/22]
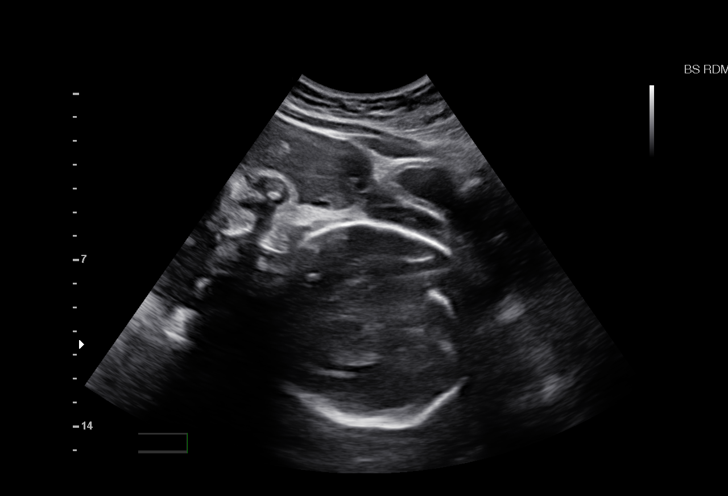
[im 3/22]
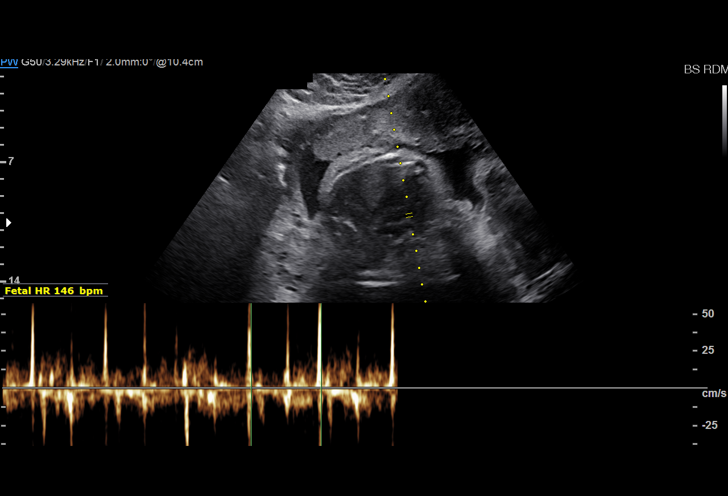
[im 4/22]
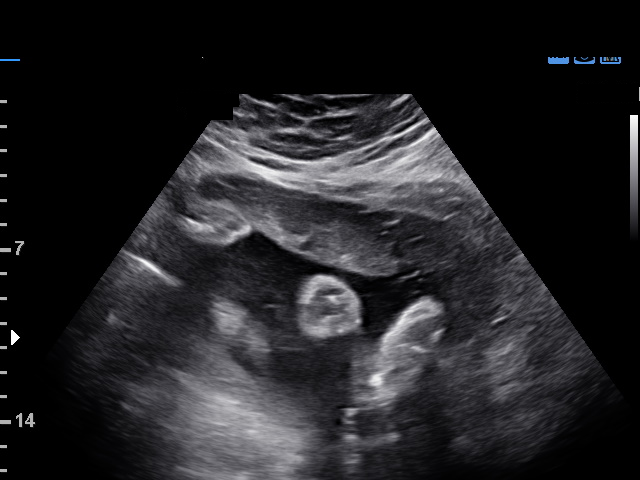
[im 6/22]
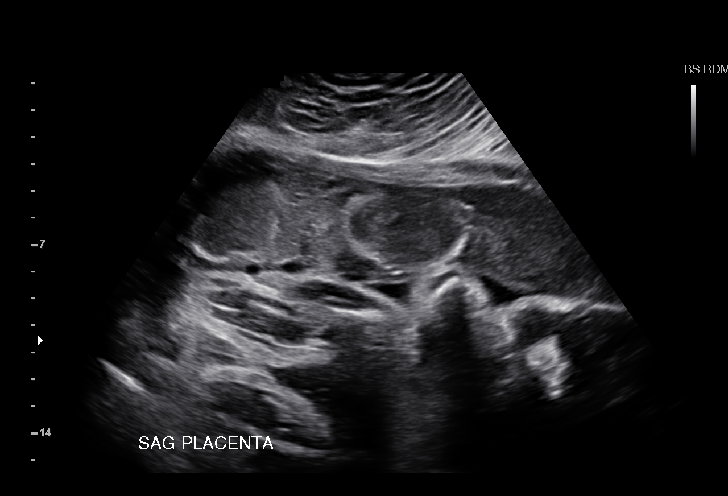
[im 7/22]
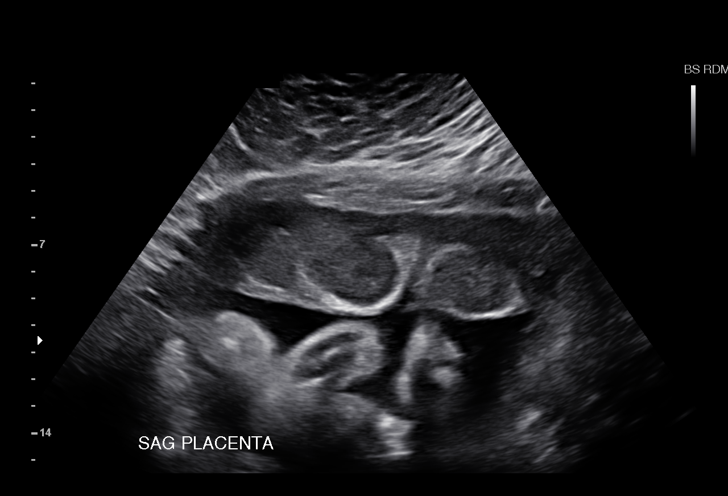
[im 9/22]
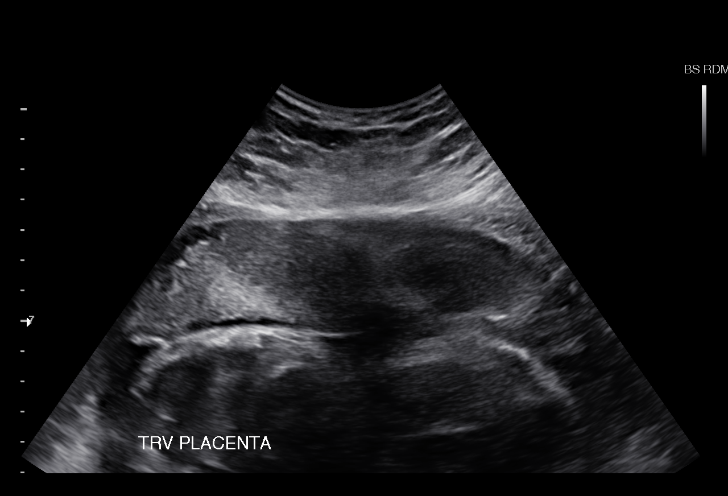
[im 10/22]
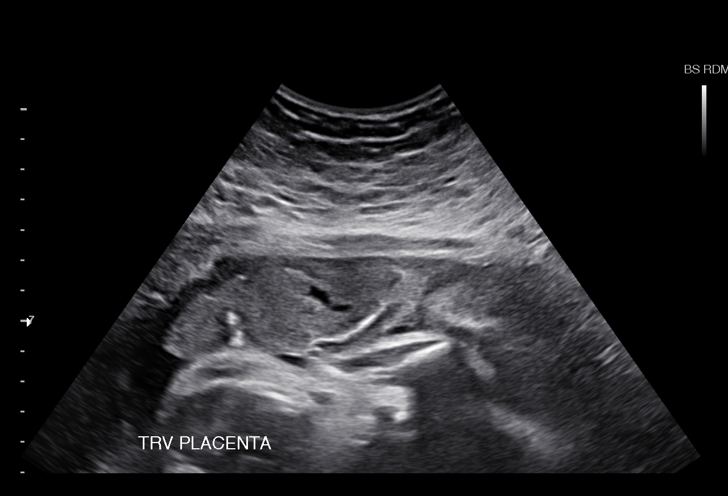
[im 12/22]
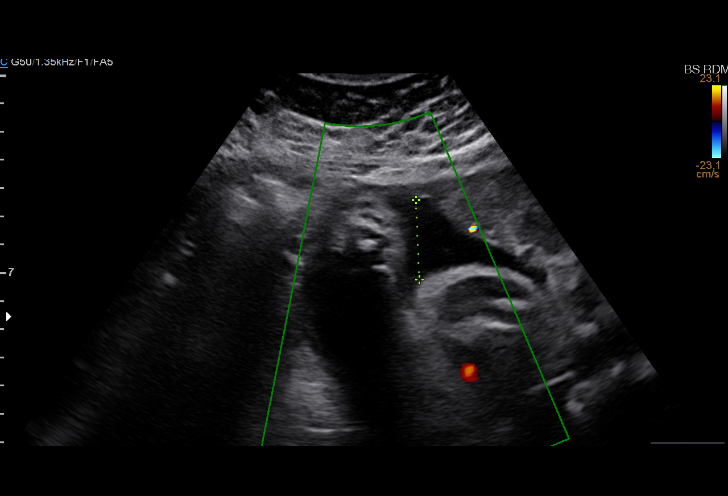
[im 13/22]
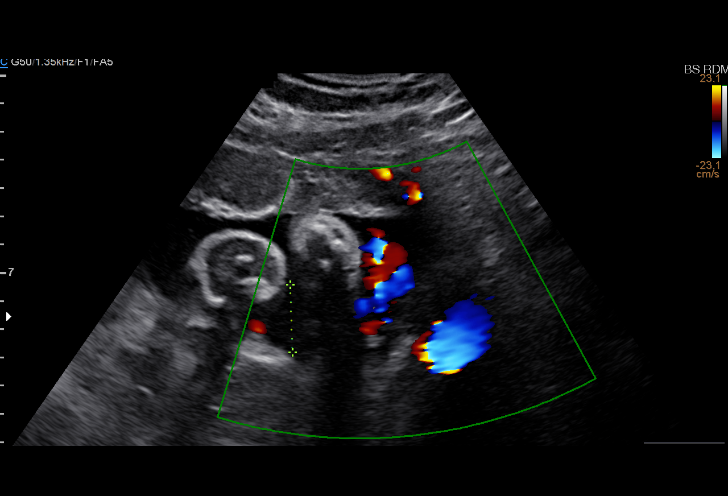
[im 14/22]
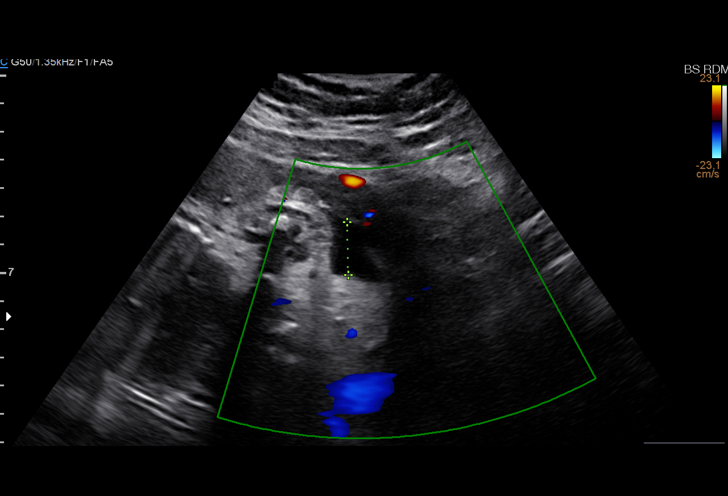
[im 16/22]
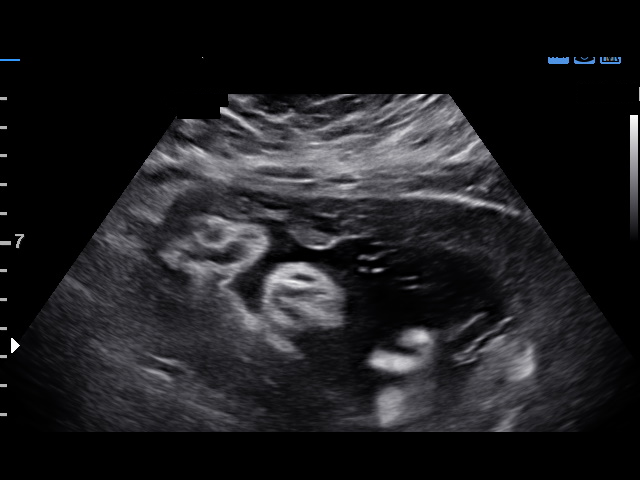
[im 17/22]
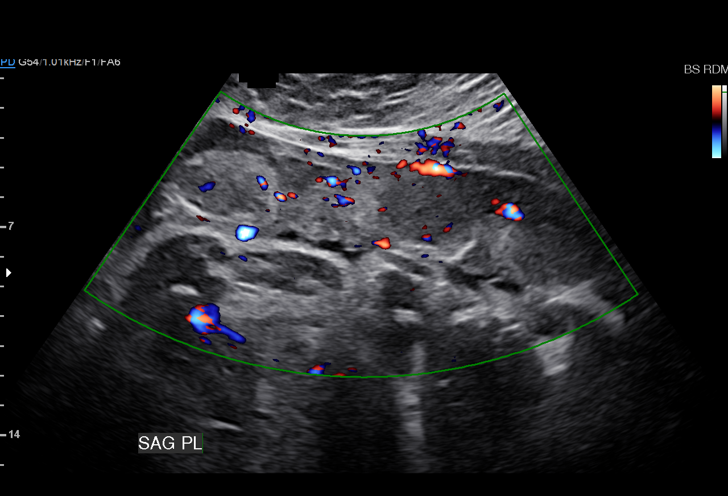
[im 19/22]
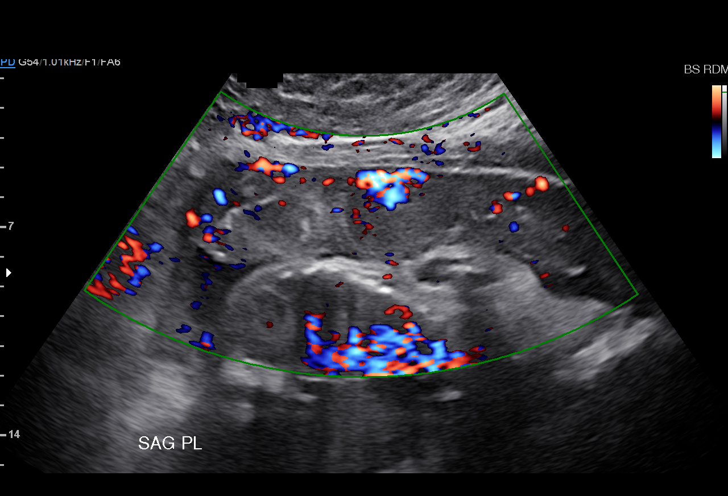
[im 20/22]
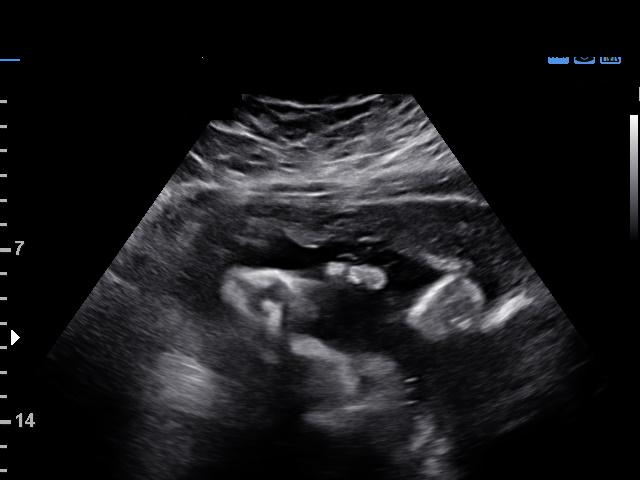
[im 22/22]
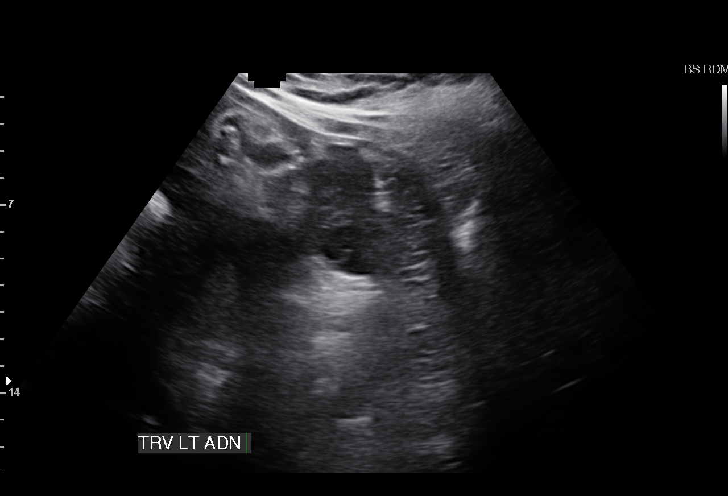

[15 of 22 positions shown; findings below may reference images not displayed]

OBSTETRICS REPORT
(Signed Final [DATE] [DATE])

Name:       XINWA                    Visit  [DATE] [DATE]
Date:

Service(s) Provided

US MFM OB LIMITED                                      76815.01
Indications

Maternal morbid obesity                                [RN] [RN]
Other mental disorder comlplicating pregnancy,         [RN]
unspecified trimester (Major depressive disorder,
suicidal ideation)
Maternal care for known or suspected poor fetal        [RN]
growth, third trimester, not applicable or
unspecified
Abnormal biochemical finding on antenatal              [RN]
screening of XINWA
XINWA NST                                       [RN]
Vaginal bleeding in pregnancy, third trimester         [RN]
35 weeks gestation of pregnancy
Fetal Evaluation

Num Of             1
Fetuses:
Fetal Heart        146                          bpm
Rate:
Cardiac Activity:  Observed
Presentation:      Cephalic
Placenta:          Anterior, above cervical
os
P. Cord            Previously Visualized
Insertion:

Amniotic Fluid
AFI FV:      Subjectively within normal limits
AFI Sum:     12.2     cm      38  %Tile     Larg Pckt:    5.11   cm
RUQ:   5.11    cm    RLQ:   1.87    cm   LUQ:    2.82    cm   LLQ:    2.4    cm
Biophysical Evaluation

Amniotic F.V:   Pocket => 2 cm two          F. Tone:        Observed
planes
F. Movement:    Observed                    Score:          [DATE]
F. Breathing:   Observed
Gestational Age

LMP:           37w 6d        Date:  [DATE]                  EDD:   [DATE]
Best:          35w 4d    Det. By:   Early Ultrasound          EDD:   [DATE]
([DATE])
Cervix Uterus Adnexa

Cervix:       Not visualized (advanced GA >[RN])

Left Ovary:    Previously seen.
Right Ovary:   Previously seen

Adnexa:     No abnormality visualized.
Impression

SIUP at 35+4 weeks
Normal amniotic fluid volume
Anterior placenta; no previa; no subchorionic fluid
collections/hemorrhage identified
BPP [DATE]
Recommendations

Continue weekly BPPs or twice weekly NSTs with weekly
AFIs and UA dopplers

XINWA with us.  Please do not hesitate to contact

## 2015-09-19 MED ORDER — METRONIDAZOLE 500 MG PO TABS
2000.0000 mg | ORAL_TABLET | Freq: Once | ORAL | Status: AC
  Administered 2015-09-19: 2000 mg via ORAL
  Filled 2015-09-19: qty 4

## 2015-09-19 NOTE — MAU Provider Note (Signed)
Chief Complaint:  Vaginal Bleeding and Abdominal Cramping   None     HPI: Alexandra Gray is a 24 y.o. G1P0 at 4445w4d pt of [facility] who presents to maternity admissions reporting onset of bright red bleeding with clots about an hour ago, 30 minutes before arrival in MAU.  She reports the bleeding was not as heavy as a period but did have small clots like menstrual bleeding.  The bleeding is accompanied by abdominal cramping/contractions that are irregular and intermittent.  She denies vaginal discharge or itching prior to bleeding.  She was treated for trichomonas on 09/07/15 and reported she did not have contact with her partner so he would not need treatment. She is here with a female partner in MAU today.  She reports he is not her s/o and she reports no intercourse since her recent treatment for trichomonas. She reports good fetal movement, denies LOF, vaginal itching/burning, urinary symptoms, h/a, dizziness, n/v, or fever/chills.    Vaginal Bleeding The patient's primary symptoms include pelvic pain and vaginal bleeding. The patient's pertinent negatives include no vaginal discharge. This is a recurrent problem. The current episode started 1 to 4 weeks ago. The problem occurs intermittently. The problem has been gradually worsening. The pain is moderate. The problem affects both sides. She is pregnant. Associated symptoms include abdominal pain and back pain. Pertinent negatives include no chills, constipation, diarrhea, dysuria, fever, flank pain, frequency, headaches, nausea, urgency or vomiting. The vaginal bleeding is lighter than menses. She has been passing clots. She has not been passing tissue. Nothing aggravates the symptoms. She has tried nothing for the symptoms. Her past medical history is significant for an STD.  Abdominal Cramping This is a new problem. The current episode started yesterday. The onset quality is gradual. The problem occurs intermittently. The problem has been waxing and  waning. The pain is located in the generalized abdominal region. The pain is moderate. The quality of the pain is cramping. The abdominal pain radiates to the back. Pertinent negatives include no constipation, diarrhea, dysuria, fever, frequency, headaches, nausea or vomiting. Nothing aggravates the pain. She has tried nothing for the symptoms.    Past Medical History: Past Medical History  Diagnosis Date  . Mental disorder   . Sleep apnea   . PID (pelvic inflammatory disease)   . Gonorrhea   . Chlamydia   . Hypertension     sometimes is up, never on meds  . Ovarian cyst   . Depression     doing ok now    Past obstetric history: OB History  Gravida Para Term Preterm AB SAB TAB Ectopic Multiple Living  1         0    # Outcome Date GA Lbr Len/2nd Weight Sex Delivery Anes PTL Lv  1 Current               Past Surgical History: Past Surgical History  Procedure Laterality Date  . Knee surgery      Family History: Family History  Problem Relation Age of Onset  . Cancer Neg Hx   . Diabetes Neg Hx   . Heart disease Neg Hx   . Hypertension Neg Hx   . Stroke Neg Hx   . Hearing loss Neg Hx     Social History: Social History  Substance Use Topics  . Smoking status: Never Smoker   . Smokeless tobacco: Never Used  . Alcohol Use: No    Allergies: No Known Allergies  Meds:  Prescriptions prior to admission  Medication Sig Dispense Refill Last Dose  . omeprazole (PRILOSEC) 20 MG capsule Take 1 capsule (20 mg total) by mouth daily. 30 capsule 8 09/19/2015 at Unknown time  . Prenatal Multivit-Min-Fe-FA (PRENATAL VITAMINS) 0.8 MG tablet Take 1 tablet by mouth daily. 30 tablet 12 09/19/2015 at Unknown time  . promethazine (PHENERGAN) 25 MG tablet Take 1 tablet (25 mg total) by mouth every 6 (six) hours as needed for nausea or vomiting. 30 tablet 1 Past Week at Unknown time  . sertraline (ZOLOFT) 50 MG tablet Take 1 tablet (50 mg total) by mouth daily. 30 tablet 5 09/19/2015 at  Unknown time    ROS:  Review of Systems  Constitutional: Negative for fever, chills and fatigue.  HENT: Negative for sinus pressure.   Eyes: Negative for photophobia.  Respiratory: Negative for shortness of breath.   Cardiovascular: Negative for chest pain.  Gastrointestinal: Positive for abdominal pain. Negative for nausea, vomiting, diarrhea and constipation.  Genitourinary: Positive for vaginal bleeding and pelvic pain. Negative for dysuria, urgency, frequency, flank pain, vaginal discharge, difficulty urinating and vaginal pain.  Musculoskeletal: Positive for back pain. Negative for neck pain.  Neurological: Negative for dizziness, weakness and headaches.  Psychiatric/Behavioral: Negative.      I have reviewed patient's Past Medical Hx, Surgical Hx, Family Hx, Social Hx, medications and allergies.   Physical Exam   Patient Vitals for the past 24 hrs:  BP Temp Temp src Pulse Resp  09/19/15 1831 140/89 mmHg - - 92 -  09/19/15 1816 151/92 mmHg - - 95 -  09/19/15 1802 134/83 mmHg - - 89 -  09/19/15 1746 139/90 mmHg 99 F (37.2 C) Oral 92 20   Constitutional: Well-developed, well-nourished female in no acute distress.  Cardiovascular: normal rate Respiratory: normal effort GI: Abd soft, non-tender, gravid appropriate for gestational age.  MS: Extremities nontender, no edema, normal ROM Neurologic: Alert and oriented x 4.  GU: Neg CVAT.  PELVIC EXAM: Cervix pink, visually closed, without lesion, moderate amount frothy pink discharge, no bright red or dark bleeding noted, vaginal walls and external genitalia normal Cervix 0/thick/high, posterior, no blood on glove following exam     FHT:  Baseline 145 , moderate variability, 10x10 accels present but no 15x15 accels, inconsistent tracing due to body habitus, no decelerations Contractions: None on toco or to palpation, but difficult r/t body habitus   Labs: Results for orders placed or performed during the hospital encounter  of 09/19/15 (from the past 24 hour(s))  Wet prep, genital     Status: Abnormal   Collection Time: 09/19/15  6:17 PM  Result Value Ref Range   Yeast Wet Prep HPF POC NONE SEEN NONE SEEN   Trich, Wet Prep MANY (A) NONE SEEN   Clue Cells Wet Prep HPF POC NONE SEEN NONE SEEN   WBC, Wet Prep HPF POC MANY (A) NONE SEEN  CBC     Status: Abnormal   Collection Time: 09/19/15  6:49 PM  Result Value Ref Range   WBC 11.4 (H) 4.0 - 10.5 K/uL   RBC 4.68 3.87 - 5.11 MIL/uL   Hemoglobin 12.7 12.0 - 15.0 g/dL   HCT 78.2 95.6 - 21.3 %   MCV 82.7 78.0 - 100.0 fL   MCH 27.1 26.0 - 34.0 pg   MCHC 32.8 30.0 - 36.0 g/dL   RDW 08.6 (H) 57.8 - 46.9 %   Platelets 232 150 - 400 K/uL  Comprehensive metabolic panel  Status: Abnormal   Collection Time: 09/19/15  6:49 PM  Result Value Ref Range   Sodium 133 (L) 135 - 145 mmol/L   Potassium 3.6 3.5 - 5.1 mmol/L   Chloride 102 101 - 111 mmol/L   CO2 22 22 - 32 mmol/L   Glucose, Bld 96 65 - 99 mg/dL   BUN 8 6 - 20 mg/dL   Creatinine, Ser 1.61 0.44 - 1.00 mg/dL   Calcium 9.0 8.9 - 09.6 mg/dL   Total Protein 7.0 6.5 - 8.1 g/dL   Albumin 3.0 (L) 3.5 - 5.0 g/dL   AST 23 15 - 41 U/L   ALT 19 14 - 54 U/L   Alkaline Phosphatase 143 (H) 38 - 126 U/L   Total Bilirubin 0.4 0.3 - 1.2 mg/dL   GFR calc non Af Amer >60 >60 mL/min   GFR calc Af Amer >60 >60 mL/min   Anion gap 9 5 - 15   A/Positive/-- (04/21 0000)  Imaging:  Preliminary report with 8/8 BPP, no visible evidence of abruption  MAU Course/MDM: I have ordered labs and ultrasound and reviewed results.  Reviewed FHR tracing.  With no active bleeding on exam, normal ultrasound, and diagnosis of trichomonas today, likely bleeding is cervical bleeding from infection.  [place] with 8/8 BPP and no evidence of abruption.  Treatments in MAU included Flagyl 2 g PO x 1 dose.  Preeclampsia labs negative.  Preeclampsia precautions given to pt.   Pt stable at time of discharge.  Assessment: 1. Trichomonal vaginitis  during pregnancy in third trimester   2. Vaginal bleeding in pregnancy, third trimester   3. NST (non-stress test) nonreactive   4. Hypertension affecting pregnancy in third trimester     Plan: Consult Dr Emelda Fear to discuss assessment, labs, imaging Discharge home with preeclampsia and bleeding precautions No intercourse x 1 week following today's treatment Labor precautions and fetal kick counts F/U in [facility] this week      Follow-up Information    Follow up with [facility].   Specialty:  Obstetrics and Gynecology   Why:  As scheduled, Return to MAU as needed for emergencies   Contact information:   [address] [place] [address] [phone]       Medication List    TAKE these medications        omeprazole 20 MG capsule  Commonly known as:  PRILOSEC  Take 1 capsule (20 mg total) by mouth daily.     Prenatal Vitamins 0.8 MG tablet  Take 1 tablet by mouth daily.     promethazine 25 MG tablet  Commonly known as:  PHENERGAN  Take 1 tablet (25 mg total) by mouth every 6 (six) hours as needed for nausea or vomiting.     sertraline 50 MG tablet  Commonly known as:  ZOLOFT  Take 1 tablet (50 mg total) by mouth daily.        Sharen Counter Certified Nurse-Midwife 09/19/2015 8:17 PM

## 2015-09-19 NOTE — MAU Note (Addendum)
Had light bleeding earlier, now is heavy like a period.  Cramping all day

## 2015-09-19 NOTE — Discharge Instructions (Signed)
Vaginal Bleeding During Pregnancy, Third Trimester A small amount of bleeding (spotting) from the vagina is relatively common in pregnancy. Various things can cause bleeding or spotting in pregnancy. Sometimes the bleeding is normal and is not a problem. However, bleeding during the third trimester can also be a sign of something serious for the mother and the baby. Be sure to tell your health care provider about any vaginal bleeding right away.  Some possible causes of vaginal bleeding during the third trimester include:   The placenta may be partially or completely covering the opening to the cervix (placenta previa).   The placenta may have separated from the uterus (abruption of the placenta).   There may be an infection or growth on the cervix.   You may be starting labor, called discharging of the mucus plug.   The placenta may grow into the muscle layer of the uterus (placenta accreta).  HOME CARE INSTRUCTIONS  Watch your condition for any changes. The following actions may help to lessen any discomfort you are feeling:   Follow your health care provider's instructions for limiting your activity. If your health care provider orders bed rest, you may need to stay in bed and only get up to use the bathroom. However, your health care provider may allow you to continue light activity.  If needed, make plans for someone to help with your regular activities and responsibilities while you are on bed rest.  Keep track of the number of pads you use each day, how often you change pads, and how soaked (saturated) they are. Write this down.  Do not use tampons. Do not douche.  Do not have sexual intercourse or orgasms until approved by your health care provider.  Follow your health care provider's advice about lifting, driving, and physical activities.  If you pass any tissue from your vagina, save the tissue so you can show it to your health care provider.   Only take over-the-counter  or prescription medicines as directed by your health care provider.  Do not take aspirin because it can make you bleed.   Keep all follow-up appointments as directed by your health care provider. SEEK MEDICAL CARE IF:  You have any vaginal bleeding during any part of your pregnancy.  You have cramps or labor pains.  You have a fever, not controlled by medicine. SEEK IMMEDIATE MEDICAL CARE IF:   You have severe cramps or pain in your back or belly (abdomen).  You have chills.  You have a gush of fluid from the vagina.  You pass large clots or tissue from your vagina.  Your bleeding increases.  You feel light-headed or weak.  You pass out.  You feel less movement or no movement of the baby.  MAKE SURE YOU:  Understand these instructions.  Will watch your condition.  Will get help right away if you are not doing well or get worse.   This information is not intended to replace advice given to you by your health care provider. Make sure you discuss any questions you have with your health care provider.   Document Released: 02/03/2003 Document Revised: 11/18/2013 Document Reviewed: 07/21/2013 Elsevier Interactive Patient Education 2016 [employer]  Trichomoniasis Trichomoniasis is an infection caused by an organism called Trichomonas. The infection can affect both women and men. In women, the outer female genitalia and the vagina are affected. In men, the penis is mainly affected, but the prostate and other reproductive organs can also be involved. Trichomoniasis is a  sexually transmitted infection (STI) and is most often passed to another person through sexual contact.  RISK FACTORS  Having unprotected sexual intercourse.  Having sexual intercourse with an infected partner. SIGNS AND SYMPTOMS  Symptoms of trichomoniasis in women include:  Abnormal gray-green frothy vaginal discharge.  Itching and irritation of the vagina.  Itching and irritation of the area  outside the vagina. Symptoms of trichomoniasis in men include:   Penile discharge with or without pain.  Pain during urination. This results from inflammation of the urethra. DIAGNOSIS  Trichomoniasis may be found during a Pap test or physical exam. Your health care provider may use one of the following methods to help diagnose this infection:  Testing the pH of the vagina with a test tape.  Using a vaginal swab test that checks for the Trichomonas organism. A test is available that provides results within a few minutes.  Examining a urine sample.  Testing vaginal secretions. Your health care provider may test you for other STIs, including HIV. TREATMENT   You may be given medicine to fight the infection. Women should inform their health care provider if they could be or are pregnant. Some medicines used to treat the infection should not be taken during pregnancy.  Your health care provider may recommend over-the-counter medicines or creams to decrease itching or irritation.  Your sexual partner will need to be treated if infected.  Your health care provider may test you for infection again 3 months after treatment. HOME CARE INSTRUCTIONS   Take medicines only as directed by your health care provider.  Take over-the-counter medicine for itching or irritation as directed by your health care provider.  Do not have sexual intercourse while you have the infection.  Women should not douche or wear tampons while they have the infection.  Discuss your infection with your partner. Your partner may have gotten the infection from you, or you may have gotten it from your partner.  Have your sex partner get examined and treated if necessary.  Practice safe, informed, and protected sex.  See your health care provider for other STI testing. SEEK MEDICAL CARE IF:   You still have symptoms after you finish your medicine.  You develop abdominal pain.  You have pain when you  urinate.  You have bleeding after sexual intercourse.  You develop a rash.  Your medicine makes you sick or makes you throw up (vomit). MAKE SURE YOU:  Understand these instructions.  Will watch your condition.  Will get help right away if you are not doing well or get worse.   This information is not intended to replace advice given to you by your health care provider. Make sure you discuss any questions you have with your health care provider.   Document Released: 05/09/2001 Document Revised: 12/04/2014 Document Reviewed: 08/25/2013 Elsevier Interactive Patient Education [employer]

## 2015-09-20 ENCOUNTER — Other Ambulatory Visit (HOSPITAL_COMMUNITY)
Admission: RE | Admit: 2015-09-20 | Discharge: 2015-09-20 | Disposition: A | Discharge status: Home / Self Care | Payer: Medicaid Other | Source: Ambulatory Visit | Attending: Obstetrics and Gynecology | Admitting: Obstetrics and Gynecology

## 2015-09-20 ENCOUNTER — Ambulatory Visit (INDEPENDENT_AMBULATORY_CARE_PROVIDER_SITE_OTHER): Payer: Medicaid Other | Admitting: Obstetrics and Gynecology

## 2015-09-20 VITALS — BP 133/78 | HR 91 | Temp 99.4°F | Wt 263.1 lb

## 2015-09-20 DIAGNOSIS — O365930 Maternal care for other known or suspected poor fetal growth, third trimester, not applicable or unspecified: Secondary | ICD-10-CM | POA: Diagnosis present

## 2015-09-20 DIAGNOSIS — Z113 Encounter for screening for infections with a predominantly sexual mode of transmission: Secondary | ICD-10-CM

## 2015-09-20 DIAGNOSIS — O0993 Supervision of high risk pregnancy, unspecified, third trimester: Secondary | ICD-10-CM | POA: Diagnosis not present

## 2015-09-20 DIAGNOSIS — O99343 Other mental disorders complicating pregnancy, third trimester: Secondary | ICD-10-CM

## 2015-09-20 DIAGNOSIS — F329 Major depressive disorder, single episode, unspecified: Secondary | ICD-10-CM

## 2015-09-20 DIAGNOSIS — O365990 Maternal care for other known or suspected poor fetal growth, unspecified trimester, not applicable or unspecified: Secondary | ICD-10-CM | POA: Insufficient documentation

## 2015-09-20 LAB — POCT URINALYSIS DIPSTICK
GLUCOSE UA: NEGATIVE
Nitrite, UA: NEGATIVE
UROBILINOGEN UA: 1
pH, UA: 6

## 2015-09-20 LAB — OB RESULTS CONSOLE GBS: GBS: NEGATIVE

## 2015-09-20 NOTE — Addendum Note (Signed)
Addended by: Garret ReddishBARNES, Terrel Manalo M on: 09/20/2015 04:02 PM   Modules accepted: Orders

## 2015-09-20 NOTE — Addendum Note (Signed)
Addended by: Gerome ApleyZEYFANG, Aeson Sawyers L on: 09/20/2015 11:39 AM   Modules accepted: Orders

## 2015-09-20 NOTE — Addendum Note (Signed)
Addended by: Cheree DittoGRAHAM, Raelynn Corron A on: 09/20/2015 11:05 AM   Modules accepted: Orders

## 2015-09-20 NOTE — Progress Notes (Signed)
Subjective:  [name] is a 24 y.o. G1P0 at 229w5d being seen today for ongoing prenatal care.  Mau visit yesterday for vaginal bleeding, eval revealed positive for trich, and was treated. Small amount dark discharge today, otherwise asymptomatic. No uterine tenderness, no ctxns. Normal bpp yesterday.  Contractions: Not present.  Vag. Bleeding: None. Movement: Present. Denies leaking of fluid.   The following portions of the patient's history were reviewed and updated as appropriate: allergies, current medications, past family history, past medical history, past social history, past surgical history and problem list. Problem list updated.  Objective:   Filed Vitals:   09/20/15 1031  BP: 133/78  Pulse: 91  Temp: 99.4 F (37.4 C)  Weight: 263 lb 1.6 oz (119.341 kg)    Fetal Status: Fetal Heart Rate (bpm): 145   Movement: Present     General:  Alert, oriented and cooperative. Patient is in no acute distress.  Skin: Skin is warm and dry. No rash noted.   Cardiovascular: Normal heart rate noted  Respiratory: Normal respiratory effort, no problems with respiration noted  Abdomen: Soft, gravid, appropriate for gestational age. Pain/Pressure: Present     Pelvic: Vag. Bleeding: None     Cervical exam deferred        Extremities: Normal range of motion.     Mental Status: Normal mood and affect. Normal behavior. Normal judgment and thought content.   Urinalysis:      Assessment and Plan:  Pregnancy: G1P0 at 4429w5d  1. IUGR, antenatal - continue weekly dopplers and bpps, deliver by 39  # Pregnancy - gbs, gc/chlamydia today, nkda  # vaginal bleeding - likely 2/2 trich, treated, today mild discharge, otherwise asymptomatic - abruption return precautions  # Depression - stable on zoloft 50 qd, declines up-titration, no SI  Preterm labor symptoms and general obstetric precautions including but not limited to vaginal bleeding, contractions, leaking of fluid and fetal movement  were reviewed in detail with the patient. Please refer to After Visit Summary for other counseling recommendations.  Return in about 1 week (around 09/27/2015).   Alexandra RunningNoah Bedford Wouk, MD

## 2015-09-21 LAB — GC/CHLAMYDIA PROBE AMP (~~LOC~~) NOT AT ARMC
CHLAMYDIA, DNA PROBE: NEGATIVE
Neisseria Gonorrhea: NEGATIVE

## 2015-09-22 LAB — CULTURE, BETA STREP (GROUP B ONLY)

## 2015-09-24 ENCOUNTER — Encounter (HOSPITAL_COMMUNITY): Payer: Self-pay

## 2015-09-24 ENCOUNTER — Ambulatory Visit (HOSPITAL_COMMUNITY)
Admission: RE | Admit: 2015-09-24 | Discharge: 2015-09-24 | Disposition: A | Discharge status: Home / Self Care | Payer: Medicaid Other | Source: Ambulatory Visit | Attending: Obstetrics and Gynecology | Admitting: Obstetrics and Gynecology

## 2015-09-24 VITALS — BP 135/83 | HR 89 | Wt 265.0 lb

## 2015-09-24 DIAGNOSIS — IMO0002 Reserved for concepts with insufficient information to code with codable children: Secondary | ICD-10-CM

## 2015-09-24 DIAGNOSIS — O0993 Supervision of high risk pregnancy, unspecified, third trimester: Secondary | ICD-10-CM

## 2015-09-24 DIAGNOSIS — O281 Abnormal biochemical finding on antenatal screening of mother: Secondary | ICD-10-CM | POA: Diagnosis not present

## 2015-09-24 DIAGNOSIS — O99340 Other mental disorders complicating pregnancy, unspecified trimester: Secondary | ICD-10-CM | POA: Diagnosis not present

## 2015-09-24 DIAGNOSIS — O99210 Obesity complicating pregnancy, unspecified trimester: Secondary | ICD-10-CM | POA: Insufficient documentation

## 2015-09-24 DIAGNOSIS — O365930 Maternal care for other known or suspected poor fetal growth, third trimester, not applicable or unspecified: Secondary | ICD-10-CM | POA: Insufficient documentation

## 2015-09-24 DIAGNOSIS — O365990 Maternal care for other known or suspected poor fetal growth, unspecified trimester, not applicable or unspecified: Secondary | ICD-10-CM

## 2015-09-24 DIAGNOSIS — O289 Unspecified abnormal findings on antenatal screening of mother: Secondary | ICD-10-CM

## 2015-09-24 DIAGNOSIS — Z3A36 36 weeks gestation of pregnancy: Secondary | ICD-10-CM | POA: Diagnosis not present

## 2015-09-24 DIAGNOSIS — O219 Vomiting of pregnancy, unspecified: Secondary | ICD-10-CM

## 2015-09-27 ENCOUNTER — Ambulatory Visit (INDEPENDENT_AMBULATORY_CARE_PROVIDER_SITE_OTHER): Payer: Medicaid Other | Admitting: Family Medicine

## 2015-09-27 VITALS — BP 133/90 | HR 83 | Temp 99.0°F | Wt 263.0 lb

## 2015-09-27 DIAGNOSIS — O99343 Other mental disorders complicating pregnancy, third trimester: Secondary | ICD-10-CM

## 2015-09-27 DIAGNOSIS — R030 Elevated blood-pressure reading, without diagnosis of hypertension: Secondary | ICD-10-CM

## 2015-09-27 DIAGNOSIS — F332 Major depressive disorder, recurrent severe without psychotic features: Secondary | ICD-10-CM | POA: Diagnosis not present

## 2015-09-27 DIAGNOSIS — O0993 Supervision of high risk pregnancy, unspecified, third trimester: Secondary | ICD-10-CM

## 2015-09-27 DIAGNOSIS — A5901 Trichomonal vulvovaginitis: Secondary | ICD-10-CM | POA: Diagnosis not present

## 2015-09-27 DIAGNOSIS — E669 Obesity, unspecified: Secondary | ICD-10-CM

## 2015-09-27 DIAGNOSIS — O365990 Maternal care for other known or suspected poor fetal growth, unspecified trimester, not applicable or unspecified: Secondary | ICD-10-CM

## 2015-09-27 DIAGNOSIS — O98313 Other infections with a predominantly sexual mode of transmission complicating pregnancy, third trimester: Secondary | ICD-10-CM

## 2015-09-27 DIAGNOSIS — O289 Unspecified abnormal findings on antenatal screening of mother: Secondary | ICD-10-CM

## 2015-09-27 DIAGNOSIS — O99213 Obesity complicating pregnancy, third trimester: Secondary | ICD-10-CM

## 2015-09-27 DIAGNOSIS — IMO0001 Reserved for inherently not codable concepts without codable children: Secondary | ICD-10-CM

## 2015-09-27 LAB — POCT URINALYSIS DIP (DEVICE)
BILIRUBIN URINE: NEGATIVE
GLUCOSE, UA: NEGATIVE mg/dL
KETONES UR: NEGATIVE mg/dL
Nitrite: NEGATIVE
PROTEIN: 100 mg/dL — AB
Specific Gravity, Urine: >=1.03 (ref 1.005–1.030)
Urobilinogen, UA: 0.2 mg/dL (ref 0.0–1.0)
pH: 6 (ref 5.0–8.0)

## 2015-09-27 LAB — COMPREHENSIVE METABOLIC PANEL
ALBUMIN: 3.5 g/dL — AB (ref 3.6–5.1)
ALT: 18 U/L (ref 6–29)
AST: 25 U/L (ref 10–30)
Alkaline Phosphatase: 177 U/L — ABNORMAL HIGH (ref 33–115)
BILIRUBIN TOTAL: 0.4 mg/dL (ref 0.2–1.2)
BUN: 8 mg/dL (ref 7–25)
CO2: 19 mmol/L — AB (ref 20–31)
CREATININE: 0.57 mg/dL (ref 0.50–1.10)
Calcium: 9.4 mg/dL (ref 8.6–10.2)
Chloride: 102 mmol/L (ref 98–110)
Glucose, Bld: 62 mg/dL — ABNORMAL LOW (ref 65–99)
Potassium: 4.9 mmol/L (ref 3.5–5.3)
SODIUM: 135 mmol/L (ref 135–146)
TOTAL PROTEIN: 6.7 g/dL (ref 6.1–8.1)

## 2015-09-27 NOTE — Progress Notes (Signed)
Per Herbert SetaHeather in L/D too early to schedule IOL for 10/13/15, will follow up.

## 2015-09-27 NOTE — Progress Notes (Signed)
Subjective:  Alexandra Gray is a 24 y.o. G1P0 at 3485w5d being seen today for ongoing prenatal care.  Patient reports no complaints.  Contractions: Irregular.  Vag. Bleeding: None. Movement: Present. Denies leaking of fluid.   Elevated BP:  Reports HA for > 3 weeks, these come and go. She has tried Tylenol but does not help HA. Not currently having HA.  She reports associated blurry vision but no scotomata Reports increasing nausea for the last week. She has vomitted once in the last week She endorses mild swelling in feet Denies RUQ pain.   The following portions of the patient's history were reviewed and updated as appropriate: allergies, current medications, past family history, past medical history, past social history, past surgical history and problem list. Problem list updated.  Objective:   Filed Vitals:   09/27/15 0949 09/27/15 1006  BP: 148/83 133/90  Pulse: 85 83  Temp: 99 F (37.2 C)   Weight: 263 lb (119.296 kg)     Fetal Status: Fetal Heart Rate (bpm): 142 Fundal Height: 34 cm Movement: Present  Presentation: Vertex  General:  Alert, oriented and cooperative. Patient is in no acute distress.  Skin: Skin is warm and dry. No rash noted.   Cardiovascular: Normal heart rate noted  Respiratory: Normal respiratory effort, no problems with respiration noted  Abdomen: Soft, gravid, appropriate for gestational age. Pain/Pressure: Present     Pelvic: Vag. Bleeding: None     Cervical exam deferred        Extremities: Normal range of motion.     Mental Status: Normal mood and affect. Normal behavior. Normal judgment and thought content.   Urinalysis: Urine Protein: 1+ Urine Glucose: Negative  Assessment and Plan:  Pregnancy: G1P0 at 4385w5d  1. Trichomoniasis of vagina -neg TOC, s/p flagyl  2. MDD (major depressive disorder), recurrent severe, without psychosis (HCC) -on zoloft. Mood stable  3. Obesity in pregnancy, antepartum, third trimester -early gtt negative,  third trimester neg  4. Supervision of high risk pregnancy, antepartum, third trimester -updated pregnancy box  5. Abnormal finding on antenatal screening of mother - weekly BPP  6. IUGR, antenatal - RI was 0.69 on 10/28 @[redacted]w[redacted]d  - suspected < 10th% but needs another formal EFW. - per MFM needs IOL at 39 weeks  7. Elevated Blood pressure: patient with mildly increased BP with new nausea, intermittent HA. She has elevated protein (1+) - CMP, CBC, UPC to evaluate for PIH - reviewed reasons to return to MAU (PreX precautions)  Term labor symptoms and general obstetric precautions including but not limited to vaginal bleeding, contractions, leaking of fluid and fetal movement were reviewed in detail with the patient. Please refer to After Visit Summary for other counseling recommendations.  Return in about 1 week (around 10/04/2015) for Routine prenatal care.  Future Appointments Date Time Provider Department Center  10/01/2015 11:30 AM [facility] US 2 [facility]US [facility]  10/08/2015 11:15 AM [facility] US 3 [facility]  10/15/2015 10:30 AM [facility] US 2 [facility]   Federico FlakeKimberly Niles Newton, MD

## 2015-09-27 NOTE — Patient Instructions (Signed)

## 2015-09-28 ENCOUNTER — Encounter: Payer: Self-pay | Admitting: Family Medicine

## 2015-09-28 ENCOUNTER — Telehealth: Payer: Self-pay | Admitting: General Practice

## 2015-09-28 DIAGNOSIS — IMO0001 Reserved for inherently not codable concepts without codable children: Secondary | ICD-10-CM | POA: Insufficient documentation

## 2015-09-28 DIAGNOSIS — R030 Elevated blood-pressure reading, without diagnosis of hypertension: Secondary | ICD-10-CM

## 2015-09-28 LAB — PROTEIN / CREATININE RATIO, URINE
Creatinine, Urine: 228 mg/dL (ref 20–320)
Protein Creatinine Ratio: 311 mg/g{creat} — ABNORMAL HIGH (ref 21–161)
Total Protein, Urine: 71 mg/dL — ABNORMAL HIGH (ref 5–24)

## 2015-09-28 NOTE — Telephone Encounter (Signed)
Per Dr Alvester MorinNewton, Alexandra Gray had elevated BP in clinic Tuesday and her labs are overall normal but she has worsening protein in her urine. She needs to have her BP checked again this week. Can you call Alexandra Gray to let her know the results and to schedule a blood pressure check on Thursday/Friday? Also reinforce the already discussed preeclampsia precautions. Called Alexandra Gray at both numbers- left messages on both numbers to call us back in regards to results and an appt.

## 2015-09-29 LAB — CBC
HCT: 42.6 % (ref 36.0–46.0)
Hemoglobin: 14.5 g/dL (ref 12.0–15.0)
MCH: 28.4 pg (ref 26.0–34.0)
MCHC: 34 g/dL (ref 30.0–36.0)
MCV: 83.4 fL (ref 78.0–100.0)
MPV: 12 fL (ref 8.6–12.4)
PLATELETS: 235 10*3/uL (ref 150–400)
RBC: 5.11 MIL/uL (ref 3.87–5.11)
RDW: 16.1 % — AB (ref 11.5–15.5)
WBC: 11.1 10*3/uL — ABNORMAL HIGH (ref 4.0–10.5)

## 2015-10-01 ENCOUNTER — Encounter (HOSPITAL_COMMUNITY): Payer: Self-pay

## 2015-10-01 ENCOUNTER — Other Ambulatory Visit (HOSPITAL_COMMUNITY): Payer: Self-pay | Admitting: Maternal and Fetal Medicine

## 2015-10-01 ENCOUNTER — Ambulatory Visit (HOSPITAL_COMMUNITY)
Admission: RE | Admit: 2015-10-01 | Discharge: 2015-10-01 | Disposition: A | Discharge status: Home / Self Care | Payer: Medicaid Other | Source: Ambulatory Visit | Attending: Obstetrics and Gynecology | Admitting: Obstetrics and Gynecology

## 2015-10-01 DIAGNOSIS — O281 Abnormal biochemical finding on antenatal screening of mother: Secondary | ICD-10-CM

## 2015-10-01 DIAGNOSIS — IMO0002 Reserved for concepts with insufficient information to code with codable children: Secondary | ICD-10-CM

## 2015-10-01 DIAGNOSIS — O99343 Other mental disorders complicating pregnancy, third trimester: Secondary | ICD-10-CM

## 2015-10-01 DIAGNOSIS — Z3A37 37 weeks gestation of pregnancy: Secondary | ICD-10-CM

## 2015-10-01 DIAGNOSIS — O99213 Obesity complicating pregnancy, third trimester: Secondary | ICD-10-CM

## 2015-10-01 IMAGING — US US MFM OB FOLLOW-UP
1 series · 14 of 28 positions shown · non-contrast
Comparison: none

[Series 1: us mfm ob follow-up · 47 acquisitions, 14 frames shown]
[im 2/47]
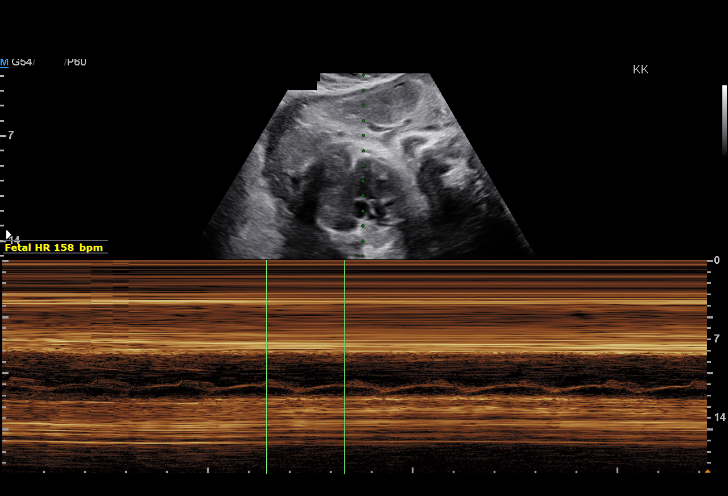
[im 6/47]
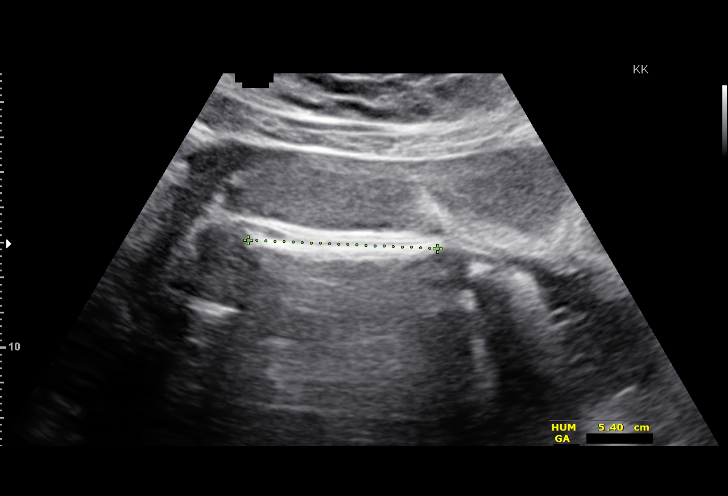
[im 9/47]
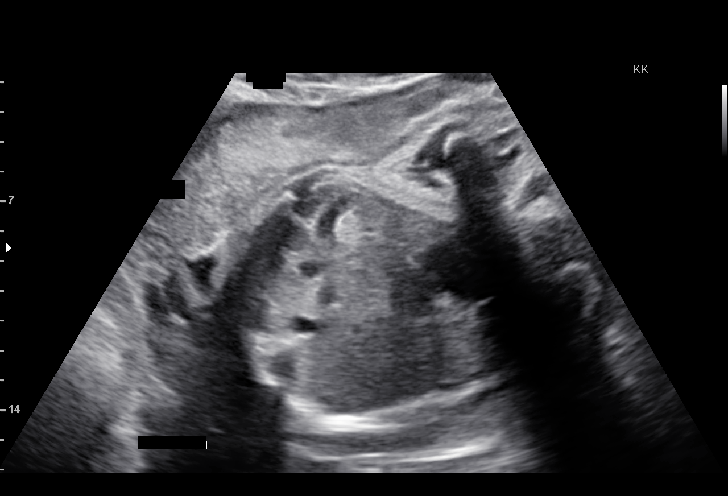
[im 12/47]
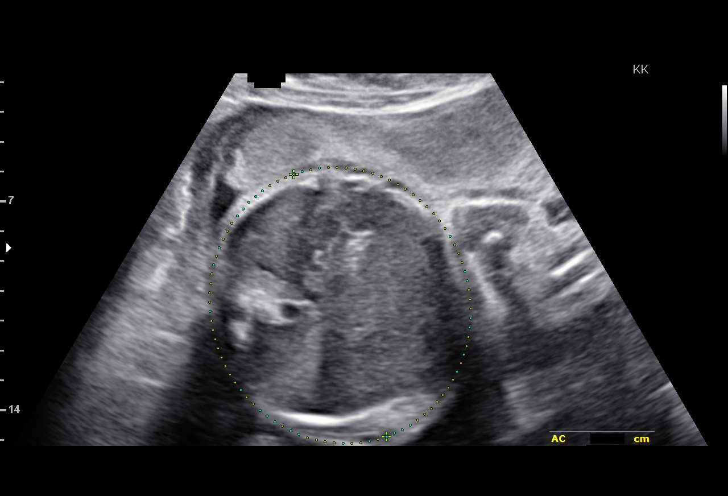
[im 16/47]
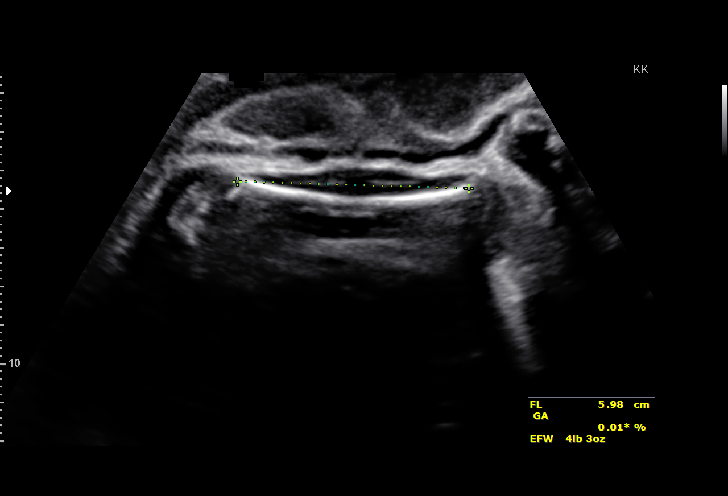
[im 19/47]
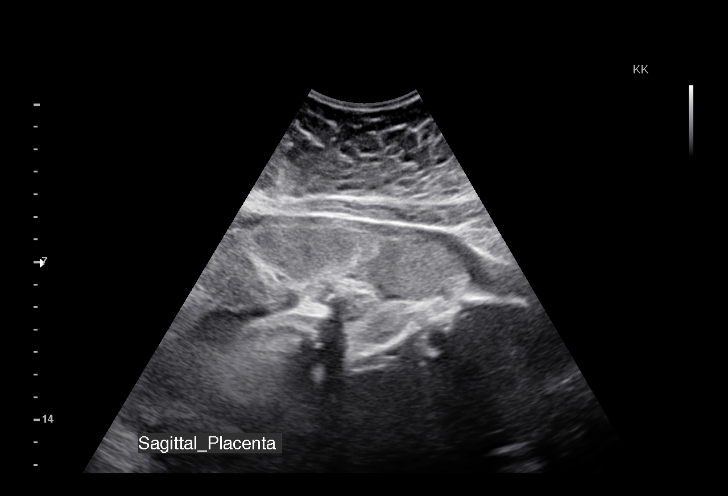
[im 23/47]
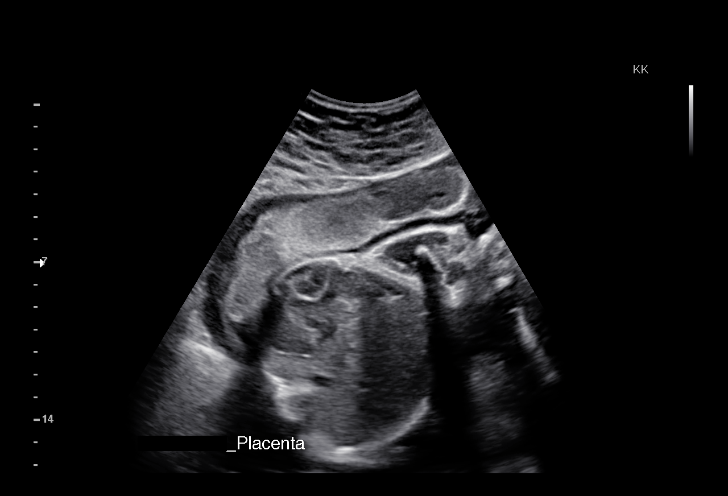
[im 26/47]
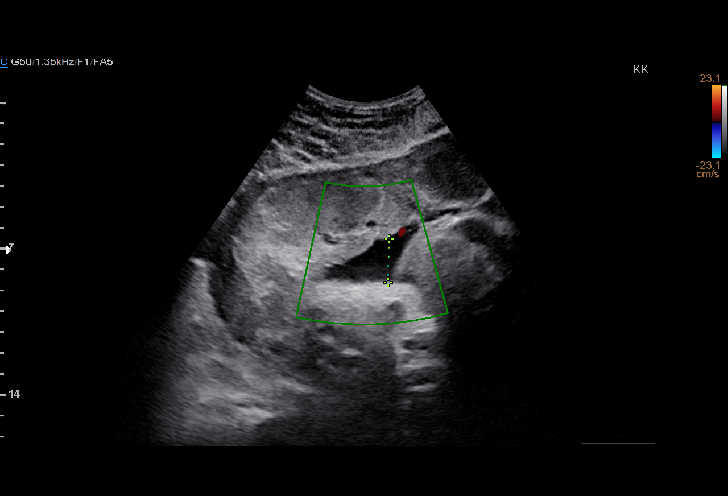
[im 29/47]
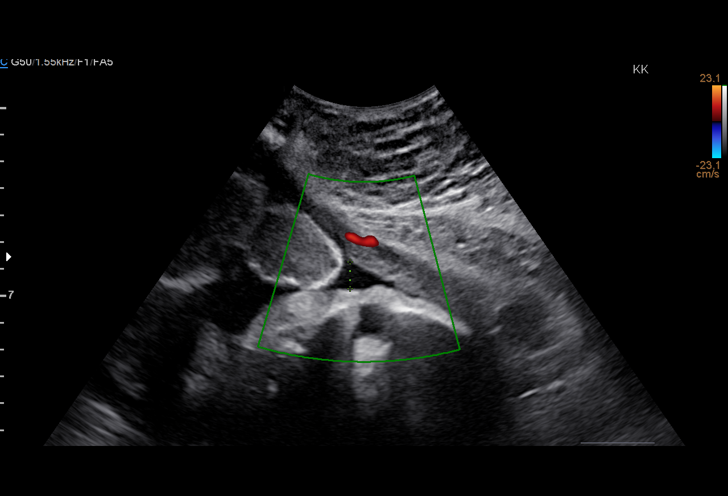
[im 33/47]
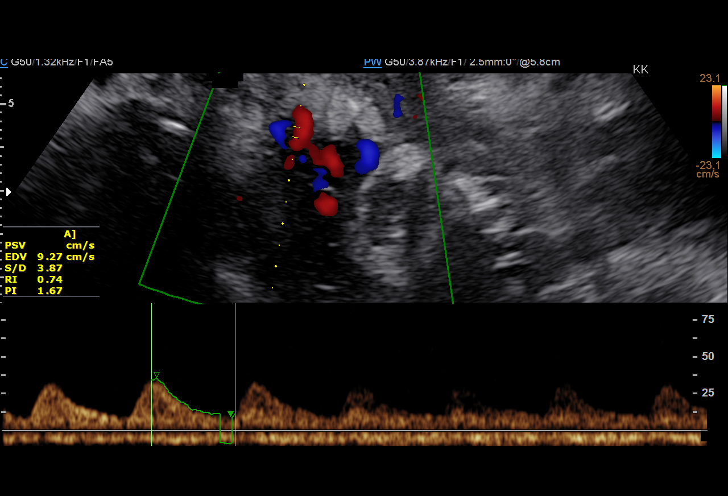
[im 36/47]
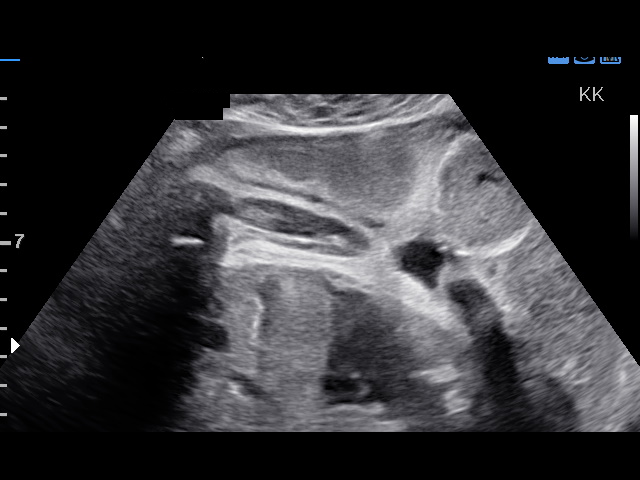
[im 40/47]
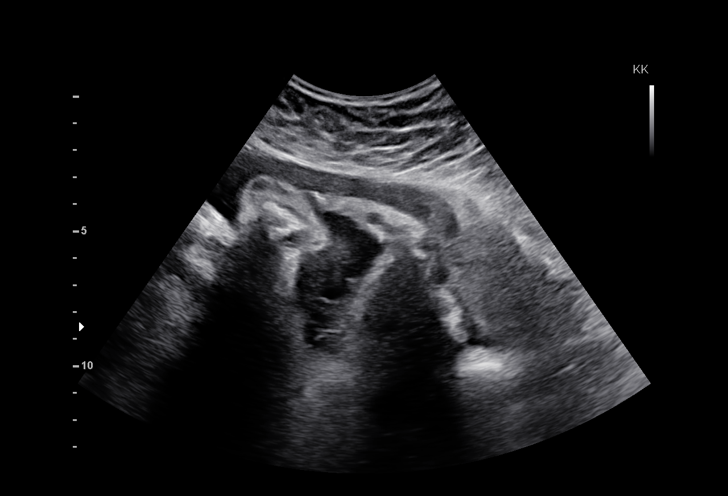
[im 43/47]
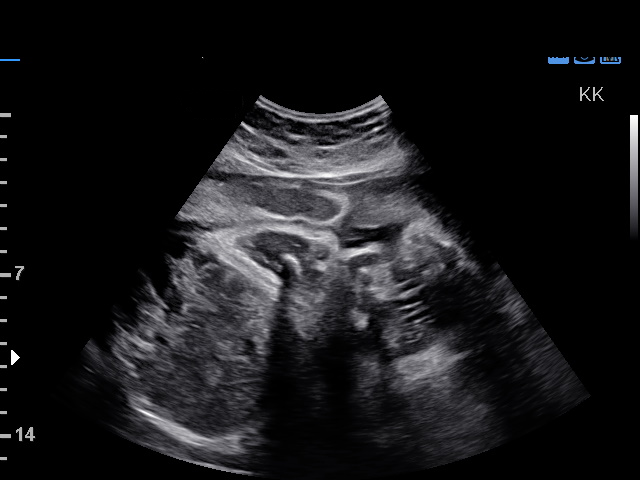
[im 47/47]
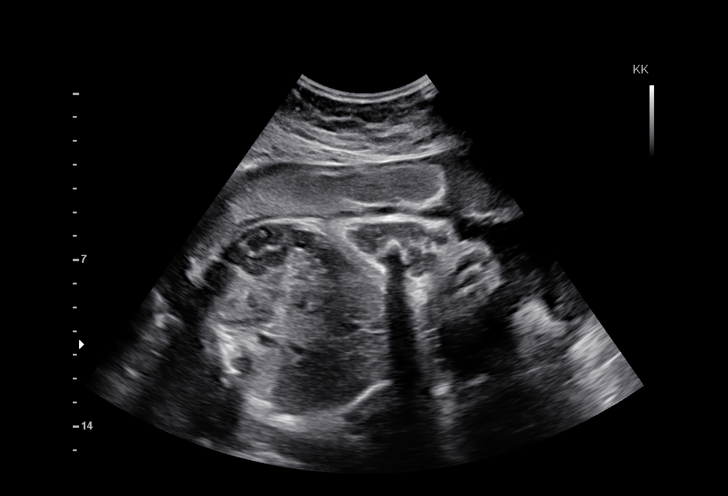

[14 of 28 positions shown; findings below may reference images not displayed]

OBSTETRICS REPORT
(Signed Final [DATE] [DATE])

Date:

Service(s) Provided

US MFM UA CORD DOPPLER                                 76820.02
Indications

Maternal morbid obesity                                [SV] [SV]
Other mental disorder comlplicating pregnancy,         [SV]
unspecified trimester (Major depressive disorder,
suicidal ideation)
Maternal care for known or suspected poor fetal        [SV]
growth, third trimester, not applicable or
unspecified
Abnormal biochemical finding on antenatal              [SV]
screening of TIGER
37 weeks gestation of pregnancy
Fetal Evaluation

Num Of             1
Fetuses:
Fetal Heart        158                          bpm
Rate:
Cardiac Activity:  Observed
Presentation:      Cephalic
Placenta:          Anterior, above cervical
os
P. Cord            Previously Visualized
Insertion:

Comment     BPP time = 29 minutes.
:

Amniotic Fluid
AFI FV:      Oligohydramnios
AFI Sum:     6.42     cm     < 3  %Tile     Larg Pckt:    2.07   cm
RUQ:   2.07    cm    RLQ:   0.99    cm   LUQ:    1.29    cm   LLQ:    2.07   cm
Biophysical Evaluation
Amniotic F.V:   Within normal limits        F. Tone:        Observed
F. Movement:    Observed                    Score:          [DATE]
F. Breathing:   Observed
Biometry

BPD:     82.3   m    G. Age:   33w 1d                 CI:        74.18   70 - 86
m
FL/HC:      19.8   20.8 -
22.6
HC:     303.4   m    G. Age:   33w 5d       < 3  %    HC/AC:      1.03   0.92 -
m
AC:     293.2   m    G. Age:   33w 2d       < 3  %    FL/BPD      72.9   71 - 87
m                                     :
FL:        60   m    G. Age:   31w 1d       < 3  %    FL/AC:      20.5   20 - 24
m
HUM:     54.7   m    G. Age:   31w 6d       < 5  %
m

Est.        [SV]   gm    4 lb 8 oz    < 10   %
FW:
Gestational Age

LMP:           39w 4d        Date:  [DATE]                  EDD:   [DATE]
U/S Today:     32w 6d                                         EDD:   [DATE]
Best:          37w 2d    Det. By:   Early Ultrasound          EDD:   [DATE]
([DATE])
Anatomy

Cranium:          Appears normal         Aortic Arch:       Previously seen
Fetal Cavum:      Previously seen        Ductal Arch:       Previously seen
Ventricles:       Previously seen        Diaphragm:         Appears normal
Choroid Plexus:   Previously seen        Stomach:           Appears normal,
left sided
Cerebellum:       Previously seen        Abdomen:           Appears normal
Posterior         Previously seen        Abdominal          Previously seen
Fossa:                                   Wall:
Nuchal Fold:      Previously seen        Cord Vessels:      Previously seen
Face:             Orbits previously      Kidneys:           Appear normal
seen
Lips:             Previously seen        Bladder:           Appears normal
Palate:           Not well visualized    Spine:             Previously seen
Heart:            Appears normal         Lower              Previously seen
(4CH, axis, and        Extremities:
situs)
RVOT:             Previously seen        Upper              Previously seen
Extremities:
LVOT:             Previously seen

Other:   Female gender. Heels and 5th digit previously seen. Technically
difficult due to maternal habitus, advanced gestational age,
decreased amniotic fluid, and fetal position.
Doppler - Fetal Vessels
Umbilical Artery
S/D:   3.19           91   %tile

Impression

Singleton intrauterine pregnancy at 37 weeks 2 days
gestation with fetal cardiac activity
Cephalic presentation
BPP [DATE] with and AFI > 6cm (2x2 cm pocket)
UA Dopplers
Recommendations

Will repeat BPP AFI on [REDACTED].
Consider induction if indicated that day or at 
38 weeks
gestation

TIGER with us.  Please do not hesitate to contact

## 2015-10-02 ENCOUNTER — Encounter (HOSPITAL_COMMUNITY): Payer: Self-pay | Admitting: *Deleted

## 2015-10-02 ENCOUNTER — Inpatient Hospital Stay (HOSPITAL_COMMUNITY)
Admission: AD | Admit: 2015-10-02 | Discharge: 2015-10-06 | DRG: 765 | Disposition: A | Discharge status: Home / Self Care | Payer: Medicaid Other | Source: Ambulatory Visit | Attending: Obstetrics and Gynecology | Admitting: Obstetrics and Gynecology

## 2015-10-02 DIAGNOSIS — G4730 Sleep apnea, unspecified: Secondary | ICD-10-CM | POA: Diagnosis present

## 2015-10-02 DIAGNOSIS — O289 Unspecified abnormal findings on antenatal screening of mother: Secondary | ICD-10-CM

## 2015-10-02 DIAGNOSIS — O1494 Unspecified pre-eclampsia, complicating childbirth: Secondary | ICD-10-CM | POA: Diagnosis present

## 2015-10-02 DIAGNOSIS — O99210 Obesity complicating pregnancy, unspecified trimester: Secondary | ICD-10-CM | POA: Diagnosis present

## 2015-10-02 DIAGNOSIS — O365930 Maternal care for other known or suspected poor fetal growth, third trimester, not applicable or unspecified: Secondary | ICD-10-CM | POA: Diagnosis present

## 2015-10-02 DIAGNOSIS — O99344 Other mental disorders complicating childbirth: Secondary | ICD-10-CM | POA: Diagnosis present

## 2015-10-02 DIAGNOSIS — O139 Gestational [pregnancy-induced] hypertension without significant proteinuria, unspecified trimester: Secondary | ICD-10-CM | POA: Diagnosis present

## 2015-10-02 DIAGNOSIS — IMO0001 Reserved for inherently not codable concepts without codable children: Secondary | ICD-10-CM

## 2015-10-02 DIAGNOSIS — O219 Vomiting of pregnancy, unspecified: Secondary | ICD-10-CM

## 2015-10-02 DIAGNOSIS — F332 Major depressive disorder, recurrent severe without psychotic features: Secondary | ICD-10-CM | POA: Diagnosis present

## 2015-10-02 DIAGNOSIS — O365990 Maternal care for other known or suspected poor fetal growth, unspecified trimester, not applicable or unspecified: Secondary | ICD-10-CM

## 2015-10-02 DIAGNOSIS — O99214 Obesity complicating childbirth: Secondary | ICD-10-CM | POA: Diagnosis not present

## 2015-10-02 DIAGNOSIS — Z3A37 37 weeks gestation of pregnancy: Secondary | ICD-10-CM

## 2015-10-02 DIAGNOSIS — A5901 Trichomonal vulvovaginitis: Secondary | ICD-10-CM | POA: Diagnosis present

## 2015-10-02 DIAGNOSIS — O34219 Maternal care for unspecified type scar from previous cesarean delivery: Secondary | ICD-10-CM

## 2015-10-02 DIAGNOSIS — Z3403 Encounter for supervision of normal first pregnancy, third trimester: Secondary | ICD-10-CM | POA: Diagnosis present

## 2015-10-02 DIAGNOSIS — O1490 Unspecified pre-eclampsia, unspecified trimester: Secondary | ICD-10-CM

## 2015-10-02 DIAGNOSIS — Z6841 Body Mass Index (BMI) 40.0 and over, adult: Secondary | ICD-10-CM

## 2015-10-02 DIAGNOSIS — O0993 Supervision of high risk pregnancy, unspecified, third trimester: Secondary | ICD-10-CM

## 2015-10-02 DIAGNOSIS — R030 Elevated blood-pressure reading, without diagnosis of hypertension: Secondary | ICD-10-CM

## 2015-10-02 LAB — CBC WITH DIFFERENTIAL/PLATELET
BASOS PCT: 0 %
Basophils Absolute: 0 10*3/uL (ref 0.0–0.1)
EOS ABS: 0.1 10*3/uL (ref 0.0–0.7)
Eosinophils Relative: 1 %
HCT: 40 % (ref 36.0–46.0)
HEMOGLOBIN: 13.3 g/dL (ref 12.0–15.0)
LYMPHS PCT: 24 %
Lymphs Abs: 2.9 10*3/uL (ref 0.7–4.0)
MCH: 27.8 pg (ref 26.0–34.0)
MCHC: 33.3 g/dL (ref 30.0–36.0)
MCV: 83.5 fL (ref 78.0–100.0)
Monocytes Absolute: 1 10*3/uL (ref 0.1–1.0)
Monocytes Relative: 8 %
NEUTROS ABS: 7.9 10*3/uL — AB (ref 1.7–7.7)
NEUTROS PCT: 67 %
Platelets: 240 10*3/uL (ref 150–400)
RBC: 4.79 MIL/uL (ref 3.87–5.11)
RDW: 15.5 % (ref 11.5–15.5)
WBC: 11.9 10*3/uL — ABNORMAL HIGH (ref 4.0–10.5)

## 2015-10-02 LAB — COMPREHENSIVE METABOLIC PANEL
ALBUMIN: 3 g/dL — AB (ref 3.5–5.0)
ALT: 21 U/L (ref 14–54)
ANION GAP: 9 (ref 5–15)
AST: 24 U/L (ref 15–41)
Alkaline Phosphatase: 155 U/L — ABNORMAL HIGH (ref 38–126)
BILIRUBIN TOTAL: 0.3 mg/dL (ref 0.3–1.2)
BUN: 9 mg/dL (ref 6–20)
CO2: 24 mmol/L (ref 22–32)
Calcium: 9.5 mg/dL (ref 8.9–10.3)
Chloride: 104 mmol/L (ref 101–111)
Creatinine, Ser: 0.66 mg/dL (ref 0.44–1.00)
GFR calc Af Amer: >60 mL/min (ref >60)
GFR calc non Af Amer: >60 mL/min (ref >60)
GLUCOSE: 110 mg/dL — AB (ref 65–99)
POTASSIUM: 3.8 mmol/L (ref 3.5–5.1)
SODIUM: 137 mmol/L (ref 135–145)
TOTAL PROTEIN: 6.3 g/dL — AB (ref 6.5–8.1)

## 2015-10-02 LAB — PROTEIN / CREATININE RATIO, URINE
CREATININE, URINE: 337 mg/dL
Protein Creatinine Ratio: 0.36 mg/mg{Cre} — ABNORMAL HIGH (ref 0.00–0.15)
TOTAL PROTEIN, URINE: 121 mg/dL

## 2015-10-02 MED ORDER — MISOPROSTOL 25 MCG QUARTER TABLET
25.0000 ug | ORAL_TABLET | ORAL | Status: DC | PRN
  Administered 2015-10-02: 25 ug via VAGINAL
  Filled 2015-10-02: qty 0.25

## 2015-10-02 MED ORDER — OXYTOCIN BOLUS FROM INFUSION: 500.0000 mL | INTRAVENOUS | Status: DC

## 2015-10-02 MED ORDER — ONDANSETRON HCL 4 MG/2ML IJ SOLN
4.0000 mg | Freq: Four times a day (QID) | INTRAMUSCULAR | Status: DC | PRN
  Administered 2015-10-03: 4 mg via INTRAVENOUS

## 2015-10-02 MED ORDER — LACTATED RINGERS IV SOLN
INTRAVENOUS | Status: DC
  Administered 2015-10-02 – 2015-10-03 (×4): via INTRAVENOUS

## 2015-10-02 MED ORDER — TERBUTALINE SULFATE 1 MG/ML IJ SOLN: 0.2500 mg | Freq: Once | INTRAMUSCULAR | Status: DC | PRN

## 2015-10-02 MED ORDER — OXYCODONE-ACETAMINOPHEN 5-325 MG PO TABS: 1.0000 | ORAL_TABLET | ORAL | Status: DC | PRN

## 2015-10-02 MED ORDER — LACTATED RINGERS IV SOLN
500.0000 mL | INTRAVENOUS | Status: DC | PRN
  Administered 2015-10-03: 500 mL via INTRAVENOUS

## 2015-10-02 MED ORDER — OXYTOCIN 40 UNITS IN LACTATED RINGERS INFUSION - SIMPLE MED: 62.5000 mL/h | INTRAVENOUS | Status: DC

## 2015-10-02 MED ORDER — ZOLPIDEM TARTRATE 5 MG PO TABS: 5.0000 mg | ORAL_TABLET | Freq: Every evening | ORAL | Status: DC | PRN

## 2015-10-02 MED ORDER — CITRIC ACID-SODIUM CITRATE 334-500 MG/5ML PO SOLN
30.0000 mL | ORAL | Status: DC | PRN
Start: 2015-10-02 — End: 2015-10-03
  Administered 2015-10-03: 30 mL via ORAL
  Filled 2015-10-02: qty 15

## 2015-10-02 MED ORDER — FLEET ENEMA 7-19 GM/118ML RE ENEM: 1.0000 | ENEMA | RECTAL | Status: DC | PRN

## 2015-10-02 MED ORDER — ACETAMINOPHEN 325 MG PO TABS: 650.0000 mg | ORAL_TABLET | ORAL | Status: DC | PRN

## 2015-10-02 MED ORDER — OXYCODONE-ACETAMINOPHEN 5-325 MG PO TABS: 2.0000 | ORAL_TABLET | ORAL | Status: DC | PRN

## 2015-10-02 MED ORDER — FENTANYL CITRATE (PF) 100 MCG/2ML IJ SOLN
50.0000 ug | INTRAMUSCULAR | Status: DC | PRN
  Administered 2015-10-03: 20 ug via INTRAVENOUS

## 2015-10-02 MED ORDER — SERTRALINE HCL 50 MG PO TABS
50.0000 mg | ORAL_TABLET | Freq: Every day | ORAL | Status: DC
  Filled 2015-10-02: qty 1

## 2015-10-02 MED ORDER — LIDOCAINE HCL (PF) 1 % IJ SOLN: 30.0000 mL | INTRAMUSCULAR | Status: DC | PRN

## 2015-10-02 NOTE — MAU Note (Signed)
Past few days have not felt FM at all. Had u/s Friday and told fld low around baby. Has HTN. Has headache and blurry vision which is not new. Denies LOF or bleeding

## 2015-10-03 ENCOUNTER — Inpatient Hospital Stay (HOSPITAL_COMMUNITY): Payer: Medicaid Other | Admitting: Anesthesiology

## 2015-10-03 ENCOUNTER — Encounter (HOSPITAL_COMMUNITY): Payer: Self-pay | Admitting: Anesthesiology

## 2015-10-03 ENCOUNTER — Encounter (HOSPITAL_COMMUNITY)
Admission: AD | Disposition: A | Discharge status: Home / Self Care | Payer: Self-pay | Source: Ambulatory Visit | Attending: Obstetrics and Gynecology

## 2015-10-03 DIAGNOSIS — O34219 Maternal care for unspecified type scar from previous cesarean delivery: Secondary | ICD-10-CM

## 2015-10-03 DIAGNOSIS — O99214 Obesity complicating childbirth: Secondary | ICD-10-CM

## 2015-10-03 DIAGNOSIS — O1494 Unspecified pre-eclampsia, complicating childbirth: Secondary | ICD-10-CM

## 2015-10-03 DIAGNOSIS — Z3A37 37 weeks gestation of pregnancy: Secondary | ICD-10-CM

## 2015-10-03 DIAGNOSIS — O365930 Maternal care for other known or suspected poor fetal growth, third trimester, not applicable or unspecified: Secondary | ICD-10-CM

## 2015-10-03 DIAGNOSIS — E669 Obesity, unspecified: Secondary | ICD-10-CM

## 2015-10-03 LAB — URINALYSIS, ROUTINE W REFLEX MICROSCOPIC
BILIRUBIN URINE: NEGATIVE
Glucose, UA: NEGATIVE mg/dL
Ketones, ur: 15 mg/dL — AB
NITRITE: NEGATIVE
PH: 5.5 (ref 5.0–8.0)
Protein, ur: 100 mg/dL — AB
Urobilinogen, UA: 0.2 mg/dL (ref 0.0–1.0)

## 2015-10-03 LAB — TYPE AND SCREEN
ABO/RH(D): A POS
ANTIBODY SCREEN: NEGATIVE

## 2015-10-03 LAB — URINE MICROSCOPIC-ADD ON

## 2015-10-03 LAB — RAPID HIV SCREEN (HIV 1/2 AB+AG)
HIV 1/2 Antibodies: NONREACTIVE
HIV-1 P24 Antigen - HIV24: NONREACTIVE

## 2015-10-03 LAB — RPR: RPR Ser Ql: NONREACTIVE

## 2015-10-03 LAB — ABO/RH: ABO/RH(D): A POS

## 2015-10-03 SURGERY — Surgical Case
Anesthesia: Spinal

## 2015-10-03 MED ORDER — FENTANYL CITRATE (PF) 100 MCG/2ML IJ SOLN: 25.0000 ug | INTRAMUSCULAR | Status: DC | PRN

## 2015-10-03 MED ORDER — LACTATED RINGERS IV SOLN: INTRAVENOUS | Status: DC

## 2015-10-03 MED ORDER — MEPERIDINE HCL 25 MG/ML IJ SOLN: 6.2500 mg | INTRAMUSCULAR | Status: DC | PRN

## 2015-10-03 MED ORDER — LACTATED RINGERS IV SOLN
INTRAVENOUS | Status: DC | PRN
  Administered 2015-10-03: 07:00:00 via INTRAVENOUS

## 2015-10-03 MED ORDER — KETOROLAC TROMETHAMINE 30 MG/ML IJ SOLN
INTRAMUSCULAR | Status: AC
  Filled 2015-10-03: qty 1

## 2015-10-03 MED ORDER — BUPIVACAINE IN DEXTROSE 0.75-8.25 % IT SOLN
INTRATHECAL | Status: DC | PRN
  Administered 2015-10-03: 9 mg via INTRATHECAL

## 2015-10-03 MED ORDER — OXYCODONE-ACETAMINOPHEN 5-325 MG PO TABS
2.0000 | ORAL_TABLET | ORAL | Status: DC | PRN
  Administered 2015-10-03 – 2015-10-05 (×4): 2 via ORAL
  Filled 2015-10-03 (×4): qty 2

## 2015-10-03 MED ORDER — ONDANSETRON HCL 4 MG/2ML IJ SOLN
INTRAMUSCULAR | Status: AC
  Filled 2015-10-03: qty 2

## 2015-10-03 MED ORDER — SIMETHICONE 80 MG PO CHEW
80.0000 mg | CHEWABLE_TABLET | ORAL | Status: DC
  Administered 2015-10-04 – 2015-10-05 (×3): 80 mg via ORAL
  Filled 2015-10-03 (×5): qty 1

## 2015-10-03 MED ORDER — SENNOSIDES-DOCUSATE SODIUM 8.6-50 MG PO TABS
2.0000 | ORAL_TABLET | ORAL | Status: DC
  Administered 2015-10-04 – 2015-10-05 (×3): 2 via ORAL
  Filled 2015-10-03 (×5): qty 2

## 2015-10-03 MED ORDER — DEXTROSE 5 % IV SOLN
3.0000 g | Freq: Once | INTRAVENOUS | Status: DC
  Filled 2015-10-03: qty 3000

## 2015-10-03 MED ORDER — IBUPROFEN 600 MG PO TABS: 600.0000 mg | ORAL_TABLET | Freq: Four times a day (QID) | ORAL | Status: DC | PRN

## 2015-10-03 MED ORDER — MORPHINE SULFATE (PF) 0.5 MG/ML IJ SOLN
INTRAMUSCULAR | Status: AC
  Filled 2015-10-03: qty 100

## 2015-10-03 MED ORDER — OXYTOCIN 10 UNIT/ML IJ SOLN
INTRAMUSCULAR | Status: AC
  Filled 2015-10-03: qty 4

## 2015-10-03 MED ORDER — OXYCODONE-ACETAMINOPHEN 5-325 MG PO TABS
1.0000 | ORAL_TABLET | ORAL | Status: DC | PRN
  Administered 2015-10-04 – 2015-10-06 (×4): 1 via ORAL
  Filled 2015-10-03 (×4): qty 1

## 2015-10-03 MED ORDER — PRENATAL MULTIVITAMIN CH
1.0000 | ORAL_TABLET | Freq: Every day | ORAL | Status: DC
  Administered 2015-10-04 – 2015-10-06 (×3): 1 via ORAL
  Filled 2015-10-03 (×5): qty 1

## 2015-10-03 MED ORDER — MENTHOL 3 MG MT LOZG
1.0000 | LOZENGE | OROMUCOSAL | Status: DC | PRN
  Filled 2015-10-03: qty 9

## 2015-10-03 MED ORDER — NALBUPHINE HCL 10 MG/ML IJ SOLN: 5.0000 mg | INTRAMUSCULAR | Status: DC | PRN

## 2015-10-03 MED ORDER — FENTANYL CITRATE (PF) 100 MCG/2ML IJ SOLN
INTRAMUSCULAR | Status: AC
  Filled 2015-10-03: qty 4

## 2015-10-03 MED ORDER — DEXTROSE 5 % IV SOLN
3.0000 g | INTRAVENOUS | Status: DC | PRN
  Administered 2015-10-03: 3 g via INTRAVENOUS

## 2015-10-03 MED ORDER — PHENYLEPHRINE 8 MG IN D5W 100 ML (0.08MG/ML) PREMIX OPTIME
INJECTION | INTRAVENOUS | Status: DC | PRN
  Administered 2015-10-03: 60 ug/min via INTRAVENOUS

## 2015-10-03 MED ORDER — DIPHENHYDRAMINE HCL 25 MG PO CAPS
25.0000 mg | ORAL_CAPSULE | ORAL | Status: DC | PRN
  Filled 2015-10-03: qty 1

## 2015-10-03 MED ORDER — LANOLIN HYDROUS EX OINT: 1.0000 "application " | TOPICAL_OINTMENT | CUTANEOUS | Status: DC | PRN

## 2015-10-03 MED ORDER — OXYTOCIN 40 UNITS IN LACTATED RINGERS INFUSION - SIMPLE MED: 62.5000 mL/h | INTRAVENOUS | Status: AC

## 2015-10-03 MED ORDER — ONDANSETRON HCL 4 MG/2ML IJ SOLN: 4.0000 mg | Freq: Three times a day (TID) | INTRAMUSCULAR | Status: DC | PRN

## 2015-10-03 MED ORDER — SIMETHICONE 80 MG PO CHEW
80.0000 mg | CHEWABLE_TABLET | Freq: Three times a day (TID) | ORAL | Status: DC
  Administered 2015-10-03 – 2015-10-06 (×8): 80 mg via ORAL
  Filled 2015-10-03 (×14): qty 1

## 2015-10-03 MED ORDER — SODIUM CHLORIDE 0.9 % IJ SOLN: 3.0000 mL | INTRAMUSCULAR | Status: DC | PRN

## 2015-10-03 MED ORDER — DIBUCAINE 1 % RE OINT
1.0000 "application " | TOPICAL_OINTMENT | RECTAL | Status: DC | PRN
  Filled 2015-10-03: qty 28

## 2015-10-03 MED ORDER — NALOXONE HCL 0.4 MG/ML IJ SOLN: 0.4000 mg | INTRAMUSCULAR | Status: DC | PRN

## 2015-10-03 MED ORDER — KETOROLAC TROMETHAMINE 30 MG/ML IJ SOLN: 30.0000 mg | Freq: Four times a day (QID) | INTRAMUSCULAR | Status: AC | PRN

## 2015-10-03 MED ORDER — ACETAMINOPHEN 325 MG PO TABS: 650.0000 mg | ORAL_TABLET | ORAL | Status: DC | PRN

## 2015-10-03 MED ORDER — DIPHENHYDRAMINE HCL 25 MG PO CAPS
25.0000 mg | ORAL_CAPSULE | Freq: Four times a day (QID) | ORAL | Status: DC | PRN
  Filled 2015-10-03: qty 1

## 2015-10-03 MED ORDER — MORPHINE SULFATE (PF) 0.5 MG/ML IJ SOLN
INTRAMUSCULAR | Status: DC | PRN
Start: 2015-10-03 — End: 2015-10-03
  Administered 2015-10-03: .1 mg via INTRATHECAL

## 2015-10-03 MED ORDER — ZOLPIDEM TARTRATE 5 MG PO TABS: 5.0000 mg | ORAL_TABLET | Freq: Every evening | ORAL | Status: DC | PRN

## 2015-10-03 MED ORDER — MIDAZOLAM HCL 2 MG/2ML IJ SOLN
INTRAMUSCULAR | Status: DC | PRN
  Administered 2015-10-03: 1 mg via INTRAVENOUS

## 2015-10-03 MED ORDER — WITCH HAZEL-GLYCERIN EX PADS: 1.0000 "application " | MEDICATED_PAD | CUTANEOUS | Status: DC | PRN

## 2015-10-03 MED ORDER — NALBUPHINE HCL 10 MG/ML IJ SOLN: 5.0000 mg | Freq: Once | INTRAMUSCULAR | Status: DC | PRN

## 2015-10-03 MED ORDER — MIDAZOLAM HCL 2 MG/2ML IJ SOLN
INTRAMUSCULAR | Status: AC
  Filled 2015-10-03: qty 4

## 2015-10-03 MED ORDER — NALOXONE HCL 2 MG/2ML IJ SOSY
1.0000 ug/kg/h | PREFILLED_SYRINGE | INTRAMUSCULAR | Status: DC | PRN
  Filled 2015-10-03: qty 2

## 2015-10-03 MED ORDER — KETOROLAC TROMETHAMINE 30 MG/ML IJ SOLN
30.0000 mg | Freq: Four times a day (QID) | INTRAMUSCULAR | Status: AC | PRN
  Administered 2015-10-03: 30 mg via INTRAMUSCULAR

## 2015-10-03 MED ORDER — TETANUS-DIPHTH-ACELL PERTUSSIS 5-2.5-18.5 LF-MCG/0.5 IM SUSP: 0.5000 mL | Freq: Once | INTRAMUSCULAR | Status: DC

## 2015-10-03 MED ORDER — SIMETHICONE 80 MG PO CHEW
80.0000 mg | CHEWABLE_TABLET | ORAL | Status: DC | PRN
  Filled 2015-10-03: qty 1

## 2015-10-03 MED ORDER — DIPHENHYDRAMINE HCL 50 MG/ML IJ SOLN: 12.5000 mg | INTRAMUSCULAR | Status: DC | PRN

## 2015-10-03 MED ORDER — PHENYLEPHRINE 8 MG IN D5W 100 ML (0.08MG/ML) PREMIX OPTIME
INJECTION | INTRAVENOUS | Status: AC
  Filled 2015-10-03: qty 100

## 2015-10-03 MED ORDER — IBUPROFEN 600 MG PO TABS
600.0000 mg | ORAL_TABLET | Freq: Four times a day (QID) | ORAL | Status: DC
Start: 2015-10-03 — End: 2015-10-06
  Administered 2015-10-03 – 2015-10-06 (×12): 600 mg via ORAL
  Filled 2015-10-03 (×12): qty 1

## 2015-10-03 SURGICAL SUPPLY — 34 items
BENZOIN TINCTURE PRP APPL 2/3 (GAUZE/BANDAGES/DRESSINGS) ×2 IMPLANT
CLAMP CORD UMBIL (MISCELLANEOUS) IMPLANT
CLOTH BEACON ORANGE TIMEOUT ST (SAFETY) ×2 IMPLANT
DRAPE SHEET LG 3/4 BI-LAMINATE (DRAPES) IMPLANT
DRSG OPSITE POSTOP 4X10 (GAUZE/BANDAGES/DRESSINGS) ×2 IMPLANT
DURAPREP 26ML APPLICATOR (WOUND CARE) ×2 IMPLANT
ELECT REM PT RETURN 9FT ADLT (ELECTROSURGICAL) ×2
ELECTRODE REM PT RTRN 9FT ADLT (ELECTROSURGICAL) ×1 IMPLANT
EXTRACTOR VACUUM KIWI (MISCELLANEOUS) IMPLANT
GLOVE BIO SURGEON ST LM GN SZ9 (GLOVE) ×2 IMPLANT
GLOVE BIOGEL PI IND STRL 9 (GLOVE) ×1 IMPLANT
GLOVE BIOGEL PI INDICATOR 9 (GLOVE) ×1
GOWN STRL REUS W/TWL 2XL LVL3 (GOWN DISPOSABLE) ×2 IMPLANT
GOWN STRL REUS W/TWL LRG LVL3 (GOWN DISPOSABLE) ×2 IMPLANT
NEEDLE HYPO 25X5/8 SAFETYGLIDE (NEEDLE) ×2 IMPLANT
NS IRRIG 1000ML POUR BTL (IV SOLUTION) ×2 IMPLANT
PACK C SECTION WH (CUSTOM PROCEDURE TRAY) ×2 IMPLANT
PAD OB MATERNITY 4.3X12.25 (PERSONAL CARE ITEMS) ×2 IMPLANT
PENCIL SMOKE EVAC W/HOLSTER (ELECTROSURGICAL) ×2 IMPLANT
RETAINER VISCERAL (MISCELLANEOUS) ×2 IMPLANT
RTRCTR C-SECT PINK 25CM LRG (MISCELLANEOUS) IMPLANT
RTRCTR C-SECT PINK 34CM XLRG (MISCELLANEOUS) ×2 IMPLANT
STRIP CLOSURE SKIN 1/2X4 (GAUZE/BANDAGES/DRESSINGS) ×2 IMPLANT
SUT MNCRL 0 VIOLET CTX 36 (SUTURE) ×3 IMPLANT
SUT MONOCRYL 0 CTX 36 (SUTURE) ×3
SUT PLAIN 2 0 XLH (SUTURE) ×2 IMPLANT
SUT VIC AB 0 CT1 27 (SUTURE) ×1
SUT VIC AB 0 CT1 27XBRD ANBCTR (SUTURE) ×1 IMPLANT
SUT VIC AB 2-0 CT1 27 (SUTURE) ×1
SUT VIC AB 2-0 CT1 TAPERPNT 27 (SUTURE) ×1 IMPLANT
SUT VIC AB 4-0 KS 27 (SUTURE) ×2 IMPLANT
SYR BULB IRRIGATION 50ML (SYRINGE) IMPLANT
TOWEL OR 17X24 6PK STRL BLUE (TOWEL DISPOSABLE) ×2 IMPLANT
TRAY FOLEY CATH SILVER 14FR (SET/KITS/TRAYS/PACK) ×2 IMPLANT

## 2015-10-03 NOTE — H&P (Signed)
LABOR ADMISSION HISTORY AND PHYSICAL  Alexandra Gray is a 24 y.o. female G1P0 with IUP at [redacted]w[redacted]d by 5w [place] presenting for IOL due to gHTN, now meeting criteria for preeclampsia with a P/C ratio of 0.36. She reports No LOF, no VB, or peripheral edema, and RUQ pain. She does endorse a month-long history of blurry vision and headaches. She is not feeling regular contractions. She initially presented to the MAU for concern of decreased fetal movement and states she had not felt her baby move for the past 2 days. However, FHT was reactive and recent BPP on 10/01/15 was 8/8. She plans on breast feeding. She requests Nexplanon for birth control.  Dating: By 5w [place] --->  Estimated Date of Delivery: 10/20/15  Sono:    , CWD, normal anatomy with limited views, cephalic presentation, longitudinal lie, 269 g, 45% EFW , CWD, normal anatomy, breech presentation, longitudinal lie, 549 g, 46% EFW  anterior placenta, no previa,. Normal UA dopplers (SD 4.33), echogenic focus in RV. EFW 1114g 23rd%  Cephalic, S/D 4.45, AFI wnl, BPP 8/8  Cephalic, S/D 2.88, AFI wnl, BPP 8/8  Cephalic, S/D 3.2, AFI 13.16, BPP 8/8, 1718 g, 35%EFW  Cephalic AFI 10.42, BPP 8/8  Cephalic, S/D 3.5, AFI 10.18, BPP 8/8, 1835 g, <10%EFW  Cephalic, AFI 12.2, BPP 8/8  Cephalic, S/D 3.07, AFI 10.09, BPP 8/8  Cephalic, S/D 3.19, AFI 6.42, BPP 8/8  Prenatal History/Complications:   Clinic  [facility] Prenatal Labs  Dating  5wk [place] Blood type:   A POS  Genetic Screen Quad:  Low PAPPA Antibody: NEG  Anatomic [place]  [redacted]w[redacted]d-wnl, female, anterior placenta Rubella:  immune  GTT Early 105 Third trimester: 116 RPR:   NR  Flu vaccine  Received at [facility] HBsAg:   NEG  TDaP vaccine  Received at [facility]                    Rhogam: NA HIV:   NR  Baby Food   Breast                                           GBS: NEGATIVE (For PCN allergy, check sensitivities)  Contraception Nexplanon Pap:NEG@HD    Circumcision  n/a   Pediatrician [facility]   Support Person  Patient's father      Past Medical History: Past Medical History  Diagnosis Date  . Mental disorder   . Sleep apnea   . PID (pelvic inflammatory disease)   . Gonorrhea   . Chlamydia   . Hypertension     sometimes is up, never on meds  . Ovarian cyst   . Depression     doing ok now    Past Surgical History: Past Surgical History  Procedure Laterality Date  . Knee surgery      Obstetrical History: OB History    Gravida Para Term Preterm AB TAB SAB Ectopic Multiple Living   1         0      Social History: Social History   Social History  . Marital Status: Single    Spouse Name: N/A  . Number of Children: N/A  . Years of Education: N/A   Social History Main Topics  . Smoking status: Never Smoker   . Smokeless tobacco: Never Used  . Alcohol Use: No  . Drug Use: No  .  Sexual Activity: Not Currently    Birth Control/ Protection: None   Other Topics Concern  . None   Social History Narrative    Family History: Family History  Problem Relation Age of Onset  . Cancer Neg Hx   . Diabetes Neg Hx   . Heart disease Neg Hx   . Hypertension Neg Hx   . Stroke Neg Hx   . Hearing loss Neg Hx     Allergies: No Known Allergies  Prescriptions prior to admission  Medication Sig Dispense Refill Last Dose  . omeprazole (PRILOSEC) 20 MG capsule Take 1 capsule (20 mg total) by mouth daily. (Patient taking differently: Take 20 mg by mouth daily as needed (for heartburn/indigestion). ) 30 capsule 8 Past Week at Unknown time  . Prenatal Multivit-Min-Fe-FA (PRENATAL VITAMINS) 0.8 MG tablet Take 1 tablet by mouth daily. 30 tablet 12 10/02/2015 at Unknown time  . promethazine (PHENERGAN) 25 MG tablet Take 1 tablet (25 mg total) by mouth every 6 (six) hours as needed for nausea or vomiting. 30 tablet 1 Past Week at Unknown time  . sertraline (ZOLOFT) 50 MG tablet Take 1 tablet (50 mg total) by mouth  daily. 30 tablet 5 10/02/2015 at Unknown time     Review of Systems   All systems reviewed and negative except as stated in HPI  BP 126/71 mmHg  Pulse 91  Temp(Src) 98.3 F (36.8 C)  Resp 18  LMP 12/28/2014 General appearance: alert, cooperative and no distress Lungs: clear to auscultation bilaterally Heart: regular rate and rhythm Abdomen: soft, non-tender; bowel sounds normal Pelvic: Cervix closed on exam. Extremities: Homans sign is negative, no sign of DVT, edema Presentation: cephalic Fetal monitoringBaseline: 135 bpm, Variability: Fair (1-6 bpm), Accelerations: Reactive and Decelerations: Absent Uterine activity: UI Dilation: Fingertip Effacement (%): Thick Exam by:: Dr Sampson GoonFitzgerald   Prenatal labs: ABO, Rh: A/Positive/-- (04/21 0000) Antibody: Negative (04/21 0000) Rubella: Immune RPR: Nonreactive (04/21 0000)  HBsAg: Negative (04/21 0000)  HIV: Non-reactive (09/02 0000)  GBS:  Negative  1 hr Glucola: Early 105, Third Trimester 116 Genetic screening: low PAPP-A Anatomy US nml  Prenatal Transfer Tool  Maternal Diabetes: No Genetic Screening: Abnormal:  Results: Other:Low PAPP-A Maternal Ultrasounds/Referrals: Normal Fetal Ultrasounds or other Referrals:  Referred to Materal Fetal Medicine for weekly BPP Maternal Substance Abuse:  No Significant Maternal Medications:  Meds include: Zoloft (began 3rd trimester) Significant Maternal Lab Results: Lab values include: Group B Strep negative  Results for orders placed or performed during the hospital encounter of 10/02/15 (from the past 24 hour(s))  Comprehensive metabolic panel   Collection Time: 10/02/15  9:40 PM  Result Value Ref Range   Sodium 137 135 - 145 mmol/L   Potassium 3.8 3.5 - 5.1 mmol/L   Chloride 104 101 - 111 mmol/L   CO2 24 22 - 32 mmol/L   Glucose, Bld 110 (H) 65 - 99 mg/dL   BUN 9 6 - 20 mg/dL   Creatinine, Ser 4.090.66 0.44 - 1.00 mg/dL   Calcium 9.5 8.9 - 81.110.3 mg/dL   Total Protein 6.3 (L) 6.5  - 8.1 g/dL   Albumin 3.0 (L) 3.5 - 5.0 g/dL   AST 24 15 - 41 U/L   ALT 21 14 - 54 U/L   Alkaline Phosphatase 155 (H) 38 - 126 U/L   Total Bilirubin 0.3 0.3 - 1.2 mg/dL   GFR calc non Af Amer >60 >60 mL/min   GFR calc Af Amer >60 >60 mL/min   Anion  gap 9 5 - 15  CBC with Differential/Platelet   Collection Time: 10/02/15  9:40 PM  Result Value Ref Range   WBC 11.9 (H) 4.0 - 10.5 K/uL   RBC 4.79 3.87 - 5.11 MIL/uL   Hemoglobin 13.3 12.0 - 15.0 g/dL   HCT 16.1 09.6 - 04.5 %   MCV 83.5 78.0 - 100.0 fL   MCH 27.8 26.0 - 34.0 pg   MCHC 33.3 30.0 - 36.0 g/dL   RDW 40.9 81.1 - 91.4 %   Platelets 240 150 - 400 K/uL   Neutrophils Relative % 67 %   Neutro Abs 7.9 (H) 1.7 - 7.7 K/uL   Lymphocytes Relative 24 %   Lymphs Abs 2.9 0.7 - 4.0 K/uL   Monocytes Relative 8 %   Monocytes Absolute 1.0 0.1 - 1.0 K/uL   Eosinophils Relative 1 %   Eosinophils Absolute 0.1 0.0 - 0.7 K/uL   Basophils Relative 0 %   Basophils Absolute 0.0 0.0 - 0.1 K/uL    Patient Active Problem List   Diagnosis Date Noted  . Elevated BP 09/28/2015  . IUGR, antenatal 09/20/2015  . Trichomoniasis of vagina 09/07/2015  . Supervision of high risk pregnancy, antepartum 08/23/2015  . Abnormal finding on antenatal screening of mother 08/23/2015  . Obesity in pregnancy, antepartum   . Nausea and vomiting during pregnancy prior to [redacted] weeks gestation 02/12/2015  . 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 Suicidal ideation 08/04/2013    Assessment: AKEISHA LAGERQUIST is a 24 y.o. G1P0 at [redacted]w[redacted]d here for IOL for preeclampsia.   #Labor: Begin induction with cytotec. Will place FB when cervix favorable.  #Pain: Planning on epidural #FWB: Cat II #ID:  GBS neg #MOF: Breast #MOC: Nexplanon #Circ:  N/A  Alexandra Gobble, MD [facility], PGY-1  OB fellow attestation:  I have seen and examined this patient; I agree with above documentation in the resident's  note.   Alexandra Gray is a 24 y.o. G1P0 here for IOL given now meeting criteria for preeclampsia without severe features with a pregnancy complicated by low PAPPA, IUGR, maternal obesity, and severe maternal depression.   PE: BP 127/82 mmHg  Pulse 81  Temp(Src) 97.9 F (36.6 C) (Oral)  Resp 18  Ht  (1.549 m)  Wt 265 lb (120.203 kg)  BMI 50.10 kg/m2  LMP 12/28/2014 Gen: calm comfortable, NAD Resp: normal effort, no distress Abd: gravid  ROS, labs, PMH reviewed  Plan: #Labor: Begin induction with cytotec. Will place FB when cervix favorable.  #Pain: Planning on epidural #FWB: Cat II #ID:  GBS neg #MOF: Breast #MOC: Nexplanon #Circ:  N/A  #Preeclampsia: mild range BP currently. No need to treat with IV meds or magnesium. Will assess during labor.  #Severe Depression: patient on zoloft. Will need close pp follow up for depression as she is at high risk for postpartum depression/anxiety  Federico Flake, MD  Family Medicine, OB Fellow 10/03/2015, 6:27 AM

## 2015-10-03 NOTE — Anesthesia Postprocedure Evaluation (Signed)
  Anesthesia Post-op Note  Patient: Alexandra Gray  Procedure(s) Performed: Procedure(s): CESAREAN SECTION (N/A)  Patient Location: PACU  Anesthesia Type:Spinal and Epidural  Level of Consciousness: awake  Airway and Oxygen Therapy: Patient Spontanous Breathing  Post-op Pain: mild  Post-op Assessment: Post-op Vital signs reviewed, Patient's Cardiovascular Status Stable, Respiratory Function Stable, Patent Airway, No signs of Nausea or vomiting and Pain level controlled              Post-op Vital Signs: Reviewed and stable  Last Vitals:  Filed Vitals:   10/03/15 0910  BP:   Pulse: 78  Temp:   Resp:     Complications: No apparent anesthesia complications

## 2015-10-03 NOTE — Progress Notes (Signed)
Epidural catheter removed, tip intact.

## 2015-10-03 NOTE — Anesthesia Procedure Notes (Signed)
Spinal Patient location during procedure: OR Start time: 10/03/2015 6:45 AM Staffing Anesthesiologist: Mal AmabileFOSTER, Alexandra Garrison Performed by: anesthesiologist  Preanesthetic Checklist Completed: patient identified, site marked, surgical consent, pre-op evaluation, timeout performed, IV checked, risks and benefits discussed and monitors and equipment checked Spinal Block Patient position: sitting Prep: site prepped and draped and DuraPrep Patient monitoring: cardiac monitor, continuous pulse ox, blood pressure and heart rate Approach: midline Location: L3-4 Injection technique: catheter Needle Needle type: Tuohy and Sprotte  Needle gauge: 24 G Needle length: 12.7 cm Needle insertion depth: 7 cm Catheter type: closed end flexible Catheter size: 19 g Catheter at skin depth: 11 cm Additional Notes Epidural performed using LOR with air. SAB performed through the Epidural needle. LA+Narcotics injected and spinal needle withdrawn. Epidural catheter threaded into epidural space. Epidural needle withdrawn and sterile dressing applied. Patient placed supine with LUD. Patient tolerated procedure well. Adequate sensory level.

## 2015-10-03 NOTE — Progress Notes (Signed)
Labor Progress Note Alexandra Gray is a 24 y.o. G1P0 at 1377w4d presented for decreased fetal movement and elevated blood pressures. She was admitted for IOL due to elevated pressure meeting criteria for preeclampsia without severe features.  Pregnancy complicated by low PAPPA, IUGR, maternal obesity  S: Feeling well but reports minimal fetal movement.   O:  BP 127/82 mmHg  Pulse 81  Temp(Src) 97.9 F (36.6 C) (Oral)  Resp 18  Ht 5\' 1"  (1.549 m)  Wt 265 lb (120.203 kg)  BMI 50.10 kg/m2  LMP 12/28/2014 EFM: 130/absent/no accels, spontaneous late decelerations  CVE: Dilation: Fingertip Effacement (%): Thick Cervical Position: Posterior Presentation: Vertex Exam by:: Dr. Audree BaneHillary F., MD   A&P: 24 y.o. G1P0 7077w4d here for IOL with now the decision to proceed to primary C-section due to fetal intolerance of labor with absent beat to best variability and spontaneous late decelerations.  Patient was started on her IOL with 1 dose of cytotec. Throughout the morning the infant developed minimal and periods of absent variability with no accelerations and spontaneous decelerations. On review of the strip with Dr. Christin BachJohn Ferguson the decision was made to proceed with Csection. The patient was consented for surgery. Risk benefits and alternative were discussed and the patient signed consent.  To OR for C-section with plan for preoperative antibiotics. Plan to obtain cord gas.   Federico FlakeKimberly Niles Coni Homesley, MD 6:16 AM

## 2015-10-03 NOTE — Anesthesia Preprocedure Evaluation (Signed)
Anesthesia Evaluation  Patient identified by MRN, date of birth, ID band Patient awake    Reviewed: Allergy & Precautions, NPO status , Patient's Chart, lab work & pertinent test results  Airway Mallampati: III  TM Distance: >3 FB Neck ROM: Full    Dental no notable dental hx. (+) Teeth Intact   Pulmonary sleep apnea ,    Pulmonary exam normal breath sounds clear to auscultation       Cardiovascular hypertension, Normal cardiovascular exam Rhythm:Regular Rate:Normal     Neuro/Psych PSYCHIATRIC DISORDERS Depression negative neurological ROS     GI/Hepatic Neg liver ROS, GERD  Medicated and Controlled,  Endo/Other  Morbid obesity  Renal/GU negative Renal ROS  negative genitourinary   Musculoskeletal negative musculoskeletal ROS (+)   Abdominal (+) + obese,   Peds  Hematology  (+) anemia ,   Anesthesia Other Findings   Reproductive/Obstetrics (+) Pregnancy Gestational HTN                             Anesthesia Physical Anesthesia Plan  ASA: III  Anesthesia Plan: Spinal   Post-op Pain Management:    Induction: Intravenous  Airway Management Planned: Natural Airway  Additional Equipment:   Intra-op Plan:   Post-operative Plan:   Informed Consent: I have reviewed the patients History and Physical, chart, labs and discussed the procedure including the risks, benefits and alternatives for the proposed anesthesia with the patient or authorized representative who has indicated his/her understanding and acceptance.     Plan Discussed with: Anesthesiologist, CRNA and Surgeon  Anesthesia Plan Comments:         Anesthesia Quick Evaluation

## 2015-10-03 NOTE — Lactation Note (Signed)
This note was copied from the chart of Alexandra Gray [place]. Lactation Consultation Note  Patient Name: Alexandra Gray Cheatum [id number]'[id number]'s Date: 10/03/2015 Reason for consult: Initial assessment;NICU baby;Infant < 6lbs   Visited with G1P1 Mom at 3 hrs post C/S delivery of baby born at 4319w4d and IUGR <5 lbs.  Mom given information about lactation support offered IP and OP.  Set up DEBP and encouraged Mom to start pumping within 6 hrs, but Mom stated she wanted to start now.  Demonstrated manual breast expression, unable to attain any colostrum at present.  Encouraged manual expression, breast massage, and double pumping every 2-3 hrs.  Pump on premie setting explained.  Basic breastfeeding information reviewed, and NICU brochure left with Mom and GMO[facility].  Mom very sleepy at present, will follow up each day.  To ask for help as needed.    Consult Status Consult Status: Follow-up Date: 10/04/15 Follow-up type: In-patient    Judee ClaraSmith, Rakeem Colley E 10/03/2015, 10:55 AM

## 2015-10-03 NOTE — Progress Notes (Signed)
scds below knees applied upon arrival to PACU.

## 2015-10-03 NOTE — Op Note (Signed)
10/02/2015 - 10/03/2015  7:52 AM  PATIENT:  Alexandra Gray  24 y.o. female  PRE-OPERATIVE DIAGNOSIS:  non-reasurring fetal monitoring  POST-OPERATIVE DIAGNOSIS:  non-reasurring fetal monitoring  PROCEDURE:  Procedure(s): CESAREAN SECTION (N/A)  SURGEON:  Surgeon(s) and Role:    * Tilda Burrow, MD - Primary    * Federico Flake, MD - Assisting  ASSISTANTS: none   ANESTHESIA:   spinal  EBL:  Total I/O In: 1100 [I.V.:1100] Out: 530 [Urine:30; Blood:500]  INDICATIONS: Alexandra Gray is a 24 y.o. G1P1000 at 104w4d here for cesarean section secondary to non reassuring fetal testing with absent variability with spontaneous late decelerations. The risks of cesarean section were discussed with the patient including but were not limited to: bleeding which may require transfusion or reoperation; infection which may require antibiotics; injury to bowel, bladder, ureters or other surrounding organs; injury to the fetus; need for additional procedures including hysterectomy in the event of a life-threatening hemorrhage; placental abnormalities wth subsequent pregnancies, incisional problems, thromboembolic phenomenon and other postoperative/anesthesia complications.   The patient concurred with the proposed plan, giving informed written consent for the procedure.    FINDINGS:  Viable female infant in cephalic presentation.  Apgars 8 and 9.  Clear amniotic fluid.  Intact placenta, three vessel cord.  Normal uterus, fallopian tubes and ovaries bilaterally. SGA infant  SPECIMENS: Placenta sent to pathology COMPLICATIONS: None immediate  PROCEDURE IN DETAIL:  The patient preoperatively received intravenous antibiotics and had sequential compression devices applied to her lower extremities.  She was then taken to the operating room where spinal anesthesia was administered and was found to be adequate. She was then placed in a dorsal supine position with a leftward tilt, and prepped and draped  in a sterile manner.  A foley catheter was placed into her bladder and attached to constant gravity.  After an adequate timeout was performed, a Pfannenstiel skin incision was made with scalpel and carried through to the underlying layer of fascia. The fascia was incised in the midline, and this incision was extended bilaterally using the Mayo scissors.  Kocher clamps were applied to the superior aspect of the fascial incision and the underlying rectus muscles were dissected off bluntly. A similar process was carried out on the inferior aspect of the fascial incision. The rectus muscles were separated in the midline bluntly and the peritoneum was entered bluntly. Attention was turned to the lower uterine segment where a low transverse hysterotomy was made with a scalpel and extended bilaterally bluntly.  The infant was successfully delivered, the cord was clamped and cut and the infant was handed over to awaiting neonatology team. Cord blood gas was collected. Uterine massage was then administered, and the placenta delivered intact with a three-vessel cord. The uterus was then cleared of clot and debris.  The hysterotomy was closed with 0 Monocryl in a running locked fashion, and an imbricating layer was also placed with 0 Monocryl. One Figure-of-eight serosal stitches were placed to help with hemostasis on the left lateral edge of the hysterotomy.  The pelvis was cleared of all clot and debris. Hemostasis was confirmed on all surfaces.  The peritoneum and the muscles were reapproximated using 0 Vicryl interrupted stitches. The fascia was then closed using 0 Vicryl in a running fashion.  The skin was closed with a 4-0 Vicryl subcuticular stitch. The patient tolerated the procedure well. Sponge, lap, instrument and needle counts were correct x 2.  She was taken to the recovery room in  stable condition.   Federico FlakeKimberly Niles Rohil Lesch, MD Family Medicine, OB Fellow [facility]

## 2015-10-03 NOTE — Plan of Care (Signed)
Problem: Activity: Goal: Ability to tolerate increased activity will improve Outcome: Completed/Met Date Met:  10/03/15 Walked entire hall a couple of times and tolerated well,This was her first long walk and she did very well.  Problem: Life Cycle: Goal: Risk for postpartum hemorrhage will decrease Outcome: Completed/Met Date Met:  10/03/15 Vaginal bleeding is within normal limits.Discussed with patient what to much bleeding looks like. Goal: Chance of risk for complications during the postpartum period will decrease Outcome: Completed/Met Date Met:  10/03/15 Dicussed early ambulation Using her Incentive spirometer Eating properly Pain control Incision care  Bowel and bladder care When to call MD     Problem: Nutritional: Goal: Dietary intake will improve Outcome: Completed/Met Date Met:  10/03/15 Tolerated a Regular diet well. Goal: Mother's verbalization of comfort with breastfeeding process will improve Outcome: Progressing Infant is in NICU and did get some skin to skin but did not latch.Mom is pumping.  Problem: Role Relationship: Goal: Ability to demonstrate positive interaction with newborn will improve Outcome: Progressing Mother has been visiting NICU and has done some skin to skin.

## 2015-10-03 NOTE — Progress Notes (Signed)
Dr Maple Hudsonmoser updated on pt. Ok to remove epidural catheter. Notified pt drifts off to sleep, RR drops but Oz sat remains 96-97%. Order received. Ok to give Toradol per order.

## 2015-10-03 NOTE — Transfer of Care (Signed)
Immediate Anesthesia Transfer of Care Note  Patient: Alexandra Gray  Procedure(s) Performed: Procedure(s): CESAREAN SECTION (N/A)  Patient Location: PACU  Anesthesia Type:Spinal  Level of Consciousness: awake and alert   Airway & Oxygen Therapy: Patient Spontanous Breathing and Patient connected to nasal cannula oxygen  Post-op Assessment: Report given to RN and Post -op Vital signs reviewed and stable  Post vital signs: Reviewed and stable  Last Vitals:  Filed Vitals:   10/03/15 0311  BP: 127/82  Pulse: 81  Temp: 36.6 C  Resp: 18    Complications: No apparent anesthesia complications

## 2015-10-03 NOTE — Brief Op Note (Signed)
10/02/2015 - 10/03/2015  7:52 AM  PATIENT:  Alexandra Gray  24 y.o. female  PRE-OPERATIVE DIAGNOSIS:  non-reasurring fetal monitoring  POST-OPERATIVE DIAGNOSIS:  non-reasurring fetal monitoring  PROCEDURE:  Procedure(s): CESAREAN SECTION (N/A)  SURGEON:  Surgeon(s) and Role:    * Tilda BurrowJohn Ferguson V, MD - Primary    * Federico FlakeKimberly Niles Ashaya Raftery, MD - Assisting  PHYSICIAN ASSISTANT:   ASSISTANTS: none   ANESTHESIA:   spinal  EBL:  Total I/O In: 1100 [I.V.:1100] Out: 530 [Urine:30; Blood:500]

## 2015-10-03 NOTE — Lactation Note (Signed)
This note was copied from the chart of Girl Angela NevinCierra Delgadillo. Lactation Consultation Note  Patient Name: [name] Today's Date: 10/03/2015   Called to bedside for feeding assistance. Infant was STS with mom and sleeping currently. Baby's RN Samara DeistKathryn reported that infant licked around and did not latch. Encouraged mom to attempt to BF and perform STS as often as both mom and baby are able. Mom is pumping and has not received colostrum, encouraged her to pump for 15 minutes followed by hand expression and to take all EBM to baby. Mom voiced understanding. Enc her to call for assistance and needed.     Maternal Data    Feeding Feeding Type: Breast Fed Length of feed: 10 min  LATCH Score/Interventions Latch: Repeated attempts needed to sustain latch, nipple held in mouth throughout feeding, stimulation needed to elicit sucking reflex. Intervention(s): Adjust position;Assist with latch;Breast massage  Audible Swallowing: A few with stimulation Intervention(s): Skin to skin;Hand expression  Type of Nipple: Everted at rest and after stimulation  Comfort (Breast/Nipple): Soft / non-tender     Hold (Positioning): Full assist, staff holds infant at breast  [facility] Score: 6  Lactation Tools Discussed/Used     Consult Status      Ed BlalockSharon S Hice 10/03/2015, 4:07 PM

## 2015-10-04 ENCOUNTER — Ambulatory Visit (HOSPITAL_COMMUNITY): Payer: Medicaid Other

## 2015-10-04 ENCOUNTER — Encounter (HOSPITAL_COMMUNITY): Payer: Self-pay | Admitting: Obstetrics and Gynecology

## 2015-10-04 LAB — CBC
HCT: 34.9 % — ABNORMAL LOW (ref 36.0–46.0)
Hemoglobin: 11.3 g/dL — ABNORMAL LOW (ref 12.0–15.0)
MCH: 27.1 pg (ref 26.0–34.0)
MCHC: 32.4 g/dL (ref 30.0–36.0)
MCV: 83.7 fL (ref 78.0–100.0)
Platelets: 194 10*3/uL (ref 150–400)
RBC: 4.17 MIL/uL (ref 3.87–5.11)
RDW: 15.7 % — AB (ref 11.5–15.5)
WBC: 10.1 10*3/uL (ref 4.0–10.5)

## 2015-10-04 NOTE — Clinical Social Work Maternal (Signed)
CLINICAL SOCIAL WORK MATERNAL/CHILD NOTE  Patient Details  Name: Alexandra Gray MRN: [id number] Date of Birth: 07/28/1991  Date:  10/04/2015  Clinical Social Worker Initiating Note:  Saatvik Thielman E. Berthe Oley, LCSW Date/ Time Initiated:  10/04/15/1315     Child's Name:  Alexandra Gray   Legal Guardian:  Mother   Need for Interpreter:  None   Date of Referral:  10/04/15     Reason for Referral:  Behavioral Health Issues, including SI    Referral Source:  RN   Address:   ([address] [place],  [address])  Phone number:  [phone]   Household Members:  Parents (MOB lives with her parents, Alexandra and Alexandra  Gray.)   Natural Supports (not living in the home):  Friends (MOB had a friend, Alexandra Gray, with her today, whom she says is a good support person.)   Professional Supports:  (MOB reports that she has received Mental Health care at [facility] in the past and can return at any time if needed.)   Employment:     Type of Work:     Education:      Financial Resources:  Medicaid   Other Resources:  WIC   Cultural/Religious Considerations Which May Impact Care: None stated.  MOB's facesheet states religion as Non-Denominational   Strengths:  Ability to meet basic needs , Compliance with medical plan , Home prepared for child , Understanding of illness, Pediatrician chosen  (MOB reports that pediatric follow up will be at [facility] on [address].)   Risk Factors/Current Problems:  Mental Health Concerns  (Hx of: Depression, SI, and Overdose.)   Cognitive State:  Alert , Linear Thinking , Goal Oriented , Insightful    Mood/Affect:  Calm , Comfortable , Relaxed , Interested    CSW Assessment: CSW met with MOB in her third floor room/305 to introduce services, offer support, and complete assessment due to baby's admission to NICU at 37.4 weeks and maternal history of Depression.  MOB was quiet, but pleasant and welcoming of CSW's  visit.  She reports that she is feeling well at this time and that baby is also doing well.  She reports that baby is "just small."  She states they started feeding her and she spit up the formula because "it was too much."  She states they have informed her that they will cut back on the amount they feed her at this time.  MOB states and eagerness to take baby home and wondered when this will be.  CSW encouraged MOB to not place expectations on baby's discharge date in hopes of lessening letdown if this expectation isn't met.  CSW encouraged her to focus on her baby rather than her surroundings or time she will be able to go home.  MOB appears relaxed and smiled when CSW asked how she feels when she is with the baby.  She replied that she feels "great" when she is with her.  She states her parents are very supportive and ready for baby to come home too.  MOB informed CSW that her parents and a good friend will be able to transport her back and forth to the hospital and understands that baby may need to remain in the hospital at MOB's discharge.  FOB is reportedly not involved, but MOB did not discuss this. MOB states she felt well emotionally throughout her pregnancy, despite describing it as "rough."  She reports, "I had to accept the fact that I was going   to have a small baby," and does not seem overly concerned that her baby is small at this point.  She states it was especially difficult to "monitor my pressures."  MOB reports that she restarted an antidepressant during pregnancy and is glad she did.  She reports taking 50mg of Zoloft that her OB prescribes.  She states she does not have a counselor at this time and does not feel counseling is necessary now.  She reports that she can return to her counselor at [facility] at any time if she would like to resume counseling.  She reports feeling well emotionally at this time and states no current concerns.  She states, "I have struggled with Anxiety and  Depression all my life."  CSW discussed common emotions often experienced in the first few weeks after delivery as well as education regarding perinatal mood disorders.  CSW asked MOB to monitor her emotions moving forward and stressed the importance of speaking with CSW and or her doctor if she has concerns about her Mental Health at any time.  MOB agreed.  CSW will monitor closely while in NICU and recommend Healthy Start services if needed. MOB reports having all needed items for baby at home.  She states she is aware of SIDS precautions and was open to reviewing these with CSW.  She commits to putting baby to sleep on her back in her own sleep environment 100% of the time.   CSW explained ongoing support services offered by NICU CSW and gave contact information.  MOB seemed sleepy by the end of the assessment, but smiled and thanked CSW for the visit.  CSW Plan/Description:  Patient/Family Education , Psychosocial Support and Ongoing Assessment of Needs    Alexandra Maggard Elizabeth, LCSW 10/04/2015, 2:01 PM 

## 2015-10-04 NOTE — Lactation Note (Signed)
This note was copied from the chart of Alexandra Gray Mckinny. Lactation Consultation Note  Patient Name: Alexandra Gray [place] ONGEX'BToday's Date: 10/04/2015 Reason for consult: Follow-up assessment   With this mom of a nICU baby, now 37 5/7 weeks CGA, but IUGR, weight 3 lbs 14.4 oz. Mom has been able to express a couple of ml's with pumping. i decreased her to size 21 flanges, with a good fit. Mom expressed about 3 ml's, and was doing hand expression after pumping with good technique. Mom knows to call for questions/concerns. WIC fax sent for a DEP   Maternal Data    Feeding Feeding Type: Formula Nipple Type: Slow - flow  LATCH Score/Interventions                      Lactation Tools Discussed/Used Tools: Flanges Flange Size:  (21 flanges) WIC Program: Yes Pump Review: Setup, frequency, and cleaning;Milk Storage;Other (comment) (premie setting, hand exp, NICU booklet) Initiated by:: bedside rn Date initiated:: 10/03/15   Consult Status Consult Status: Follow-up Date: 10/05/15 Follow-up type: In-patient    Alfred LevinsLee, Pola Furno Anne 10/04/2015, 1:56 PM

## 2015-10-04 NOTE — Addendum Note (Signed)
Addendum  created 10/04/15 1039 by Shanon PayorSuzanne M Bita Cartwright, CRNA   Modules edited: Notes Section   Notes Section:  File: [id number]

## 2015-10-04 NOTE — Anesthesia Postprocedure Evaluation (Signed)
  Anesthesia Post-op Note  Patient: Alexandra GettingCierra R Gray  Procedure(s) Performed: Procedure(s): CESAREAN SECTION (N/A)  Patient Location: Women's Unit  Anesthesia Type:Spinal  Level of Consciousness: awake, alert  and oriented  Airway and Oxygen Therapy: Patient Spontanous Breathing  Post-op Pain: none  Post-op Assessment: Post-op Vital signs reviewed, Patient's Cardiovascular Status Stable, Respiratory Function Stable, No signs of Nausea or vomiting, Pain level controlled, No headache and No backache              Post-op Vital Signs: Reviewed and stable  Last Vitals:  Filed Vitals:   10/04/15 0529  BP: 118/60  Pulse: 83  Temp: 36.9 C  Resp: 20    Complications: No apparent anesthesia complications

## 2015-10-04 NOTE — Progress Notes (Signed)
CSW attempted to meet with MOB to complete assessment due to baby's admission to NICU and maternal hx of Depression, but she was not in her room at this time.  CSW will attempt again at a later time. 

## 2015-10-04 NOTE — Progress Notes (Signed)
Subjective: Postpartum Day 1: Cesarean Delivery Patient reports incisional pain, tolerating PO and no problems voiding.    Objective: Vital signs in last 24 hours: Temp:  [97.7 F (36.5 C)-98.6 F (37 C)] 98.5 F (36.9 C) (11/07 0529) Pulse Rate:  [65-86] 83 (11/07 0529) Resp:  [4-23] 20 (11/07 0529) BP: (101-131)/(50-77) 118/60 mmHg (11/07 0529) SpO2:  [94 %-100 %] 100 % (11/07 0529)  Physical Exam:  General: alert, cooperative, appears stated age and no distress Lochia: appropriate Uterine Fundus: firm Incision: healing well, no significant drainage, no dehiscence, no significant erythema DVT Evaluation: Negative Homan's sign. No cords or calf tenderness.   Recent Labs  10/02/15 2140 10/04/15 0515  HGB 13.3 11.3*  HCT 40.0 34.9*    Assessment/Plan: Status post Cesarean section. Doing well postoperatively.  Continue current care.  Wyvonnia DuskyLAWSON, Aleysia Oltmann DARLENE 10/04/2015, 7:48 AM

## 2015-10-04 NOTE — Telephone Encounter (Signed)
Per chart review, patient came in and delivered baby last week

## 2015-10-05 MED ORDER — SERTRALINE HCL 50 MG PO TABS
50.0000 mg | ORAL_TABLET | Freq: Every day | ORAL | Status: DC
  Administered 2015-10-05 – 2015-10-06 (×2): 50 mg via ORAL
  Filled 2015-10-05 (×2): qty 1

## 2015-10-05 NOTE — Progress Notes (Signed)
POSTPARTUM PROGRESS NOTE  Post Partum Day 2 Subjective:  Alexandra Gray is a 24 y.o. G1P1000 5627w4d s/p pltcs for fetal intolerance. Feeling well, no ha or vision change or ruq pain.  No acute events overnight.  Pt denies problems with ambulating, voiding or po intake.  She denies nausea or vomiting.  Pain is moderately controlled.  She has had flatus. She has not had bowel movement.  Lochia Small.   Objective: Blood pressure 100/67, pulse 103, temperature 98.3 F (36.8 C), temperature source Oral, resp. rate 18, height 5\' 1"  (1.549 m), weight 265 lb (120.203 kg), last menstrual period 12/28/2014, SpO2 99 %, unknown if currently breastfeeding.  Physical Exam:  General: alert, cooperative and no distress Lochia:normal flow Chest: CTAB Heart: RRR no m/r/g Abdomen: +BS, soft, nontender,  Uterine Fundus: firm, incision c/d/i DVT Evaluation: No calf swelling or tenderness Extremities: trace edema   Recent Labs  10/02/15 2140 10/04/15 0515  HGB 13.3 11.3*  HCT 40.0 34.9*    Assessment/Plan:  ASSESSMENT: Alexandra Gray is a 24 y.o. G1P1000 9927w4d s/p pltcs. H 11.3 after surgery, from 13.3. Depression symptoms stable on zoloft. BP wnl, no symptoms preeclampsia. Baby in nicu, plan for d/c tmrw    LOS: 3 days   Silvano Bilisoah B Bardia Wangerin 10/05/2015, 8:39 AM

## 2015-10-05 NOTE — Lactation Note (Signed)
This note was copied from the chart of Alexandra Gray. Lactation Consultation Note  Patient Name: Alexandra Gray Date: 10/05/2015 Reason for consult: Follow-up assessment;NICU baby  NICU baby 2 hours old. Met with mom in NICU at baby's bedside. Mom is currently giving baby a bottle. Mom states that she is pumping and she was able to get about 2 ml of EBM. Mom aware of pumping rooms in NICU and the need to take pumping parts with her when she is discharged in order to use when she visits baby. Mom states that pumping is going well, but WIC has not contacted her for an appointment. Enc mom to call for [facility] loaner if she needs one before D/C.  Maternal Data    Feeding Feeding Type: Breast Milk with Formula added Nipple Type: Slow - flow Length of feed: 5 min  LATCH Score/Interventions                      Lactation Tools Discussed/Used     Consult Status Consult Status: Follow-up Date: 10/06/15 Follow-up type: In-patient    Inocente Salles 10/05/2015, 12:02 PM

## 2015-10-05 NOTE — Progress Notes (Signed)
Post Partum Day 3  Subjective:  Alexandra Gray is a 24 y.o. G1P1000 2260w4d s/p pLTCS for fetal intolerance.  No acute events overnight.  Pt denies problems with ambulating, voiding or po intake.  She denies nausea or vomiting.  Pain is moderately controlled.  She has had flatus.  Lochia Small.  Plan for birth control is Nexplanon.  Method of Feeding: Breast. Denies vision changes or headaches. She does describe some dizziness.   Patient describes wanting to see her baby. Patient is currently separated from baby, who is in the NICU due to Hx of severe depression and risk of post partum depression.   Objective: Blood pressure 100/67, pulse 103, temperature 98.3 F (36.8 C), temperature source Oral, resp. rate 18, height 5\' 1"  (1.549 m), weight 120.203 kg (265 lb), last menstrual period 12/28/2014, SpO2 99 %, unknown if currently breastfeeding.  Physical Exam:  General: alert, cooperative and no distress Lochia:normal flow Chest: normal WOB Heart: Regular rate Abdomen: +BS, soft, mild TTP (appropriate), incision site healing well, no active bleeding or infection noted Uterine Fundus: firm DVT Evaluation: No evidence of DVT seen on physical exam. Extremities: trace edema  Recent Labs  10/02/15 2140 10/04/15 0515  HGB 13.3 11.3*  HCT 40.0 34.9*    Assessment/Plan:  ASSESSMENT: Alexandra Gray is a 24 y.o. G1P1000 7560w4d s/p pLTCS. Well appearing. No pre-eclamptic symptoms noted.    Plan for discharge tomorrow  Restarted zoloft depression Sx controlled.  Continue routine PP care Breastfeeding support PRN  SW consult impression below: " Patient/Family Education , Psychosocial Support and Ongoing Assessment of Needs"    LOS: 3 days   Levada SchillingJohannesL [place] Medical student MS3 10/05/2015, 10:18 AM

## 2015-10-06 DIAGNOSIS — O1490 Unspecified pre-eclampsia, unspecified trimester: Secondary | ICD-10-CM

## 2015-10-06 MED ORDER — OXYCODONE-ACETAMINOPHEN 5-325 MG PO TABS: 1.0000 | ORAL_TABLET | ORAL | Status: DC | PRN

## 2015-10-06 MED ORDER — IBUPROFEN 600 MG PO TABS: 600.0000 mg | ORAL_TABLET | Freq: Four times a day (QID) | ORAL | Status: DC | PRN

## 2015-10-06 NOTE — Discharge Instructions (Signed)
Cesarean Delivery, Care After  Refer to this sheet in the next few weeks. These instructions provide you with information on caring for yourself after your procedure. Your health care provider may also give you specific instructions. Your treatment has been planned according to current medical practices, but problems sometimes occur. Call your health care provider if you have any problems or questions after you go home.  HOME CARE INSTRUCTIONS   Only take over-the-counter or prescription medications as directed by your health care provider.   Do not drink alcohol, especially if you are breastfeeding or taking medication to relieve pain.   Do not chew or smoke tobacco.   Continue to use good perineal care. Good perineal care includes:    Wiping your perineum from front to back.    Keeping your perineum clean.   Check your surgical cut (incision) daily for increased redness, drainage, swelling, or separation of skin.   Clean your incision gently with soap and water every day, and then pat it dry. If your health care provider says it is okay, leave the incision uncovered. Use a bandage (dressing) if the incision is draining fluid or appears irritated. If the adhesive strips across the incision do not fall off within 7 days, carefully peel them off.   Hug a pillow when coughing or sneezing until your incision is healed. This helps to relieve pain.   Do not use tampons or douche until your health care provider says it is okay.   Shower, wash your hair, and take tub baths as directed by your health care provider.   Wear a well-fitting bra that provides breast support.   Limit wearing support panties or control-top hose.   Drink enough fluids to keep your urine clear or pale yellow.   Eat high-fiber foods such as whole grain cereals and breads, brown rice, beans, and fresh fruits and vegetables every day. These foods may help prevent or relieve constipation.   Resume activities such as climbing stairs,  driving, lifting, exercising, or traveling as directed by your health care provider.   Talk to your health care provider about resuming sexual activities. This is dependent upon your risk of infection, your rate of healing, and your comfort and desire to resume sexual activity.   Try to have someone help you with your household activities and your newborn for at least a few days after you leave the hospital.   Rest as much as possible. Try to rest or take a nap when your newborn is sleeping.   Increase your activities gradually.   Keep all of your scheduled postpartum appointments. It is very important to keep your scheduled follow-up appointments. At these appointments, your health care provider will be checking to make sure that you are healing physically and emotionally.  SEEK MEDICAL CARE IF:    You are passing large clots from your vagina. Save any clots to show your health care provider.   You have a foul smelling discharge from your vagina.   You have trouble urinating.   You are urinating frequently.   You have pain when you urinate.   You have a change in your bowel movements.   You have increasing redness, pain, or swelling near your incision.   You have pus draining from your incision.   Your incision is separating.   You have painful, hard, or reddened breasts.   You have a severe headache.   You have blurred vision or see spots.   You feel sad   or depressed.   You have thoughts of hurting yourself or your newborn.   You have questions about your care, the care of your newborn, or medications.   You are dizzy or light-headed.   You have a rash.   You have pain, redness, or swelling at the site of the removed intravenous access (IV) tube.   You have nausea or vomiting.   You stopped breastfeeding and have not had a menstrual period within 12 weeks of stopping.   You are not breastfeeding and have not had a menstrual period within 12 weeks of delivery.   You have a fever.  SEEK  IMMEDIATE MEDICAL CARE IF:   You have persistent pain.   You have chest pain.   You have shortness of breath.   You faint.   You have leg pain.   You have stomach pain.   Your vaginal bleeding saturates 2 or more sanitary pads in 1 hour.  MAKE SURE YOU:    Understand these instructions.   Will watch your condition.   Will get help right away if you are not doing well or get worse.     This information is not intended to replace advice given to you by your health care provider. Make sure you discuss any questions you have with your health care provider.     Document Released: 08/05/2002 Document Revised: 12/04/2014 Document Reviewed: 07/10/2012  Elsevier Interactive Patient Education 2016 [employer]

## 2015-10-06 NOTE — Progress Notes (Signed)
Left voice mail for Alexandra Gray [phone] for home visit in 1-2 weeks for BP check and depression screening.

## 2015-10-06 NOTE — Progress Notes (Signed)
Pt discharged to home with father.  Condition stable.  Pt to car via wheelchair with Dolphus Jenny. Riley, NT.  Pt given hand breast pump [facility] use [facility].  No other equipment for home ordered at discharge.

## 2015-10-06 NOTE — Lactation Note (Signed)
This note was copied from the chart of Girl Angela NevinCierra Sinnett. Lactation Consultation Note  Patient Name: Girl Angela NevinCierra Marinos ZOXWR'UToday's Date: 10/06/2015 Reason for consult: Follow-up assessment;NICU baby  NICU baby 8675 hours old. Mom at baby's bedside in NICU. Mom reports that pumping going well and her EBM is increasing. Mom states that she is going to be discharged today, but [facility] ([facility]-15). [facility] if [facility] can get her appointment moved up to today. [facility]. Mom given additional colostrum bottles and large collection bottles for EBM. Maternal Data    Feeding Feeding Type: Breast Milk with Formula added Nipple Type: Slow - flow Length of feed: 5 min  LATCH Score/Interventions                      Lactation Tools Discussed/Used     Consult Status Consult Status: PRN    Geralynn OchsWILLIARD, Verbon Giangregorio 10/06/2015, 10:40 AM

## 2015-10-06 NOTE — Discharge Summary (Signed)
OB Discharge Summary  Patient Name: Alexandra GettingCierra R Olano DOB: [birth year] MRN: [id number]  Date of admission: 10/02/2015 Delivering MD: Tilda BurrowFERGUSON, JOHN V   Date of discharge: 10/06/2015  Admitting diagnosis: 37 WKS, LOW FM Intrauterine pregnancy: 7878w4d     Secondary diagnosis:Principal Problem:   S/P primary low transverse C-section Active Problems:   MDD (major depressive disorder), recurrent severe, without psychosis (HCC)   Obesity in pregnancy, antepartum   Abnormal finding on antenatal screening of mother   Trichomoniasis of vagina   IUGR, antenatal   Elevated BP   Gestational hypertension   Preeclampsia  Additional problems: Low PAPPA     Discharge diagnosis: Term Pregnancy Delivered                                                                     Post partum procedures:None  Augmentation: Cytotec  Complications: None  Hospital course:  Induction of Labor With Cesarean Section  24 y.o. yo G1P1000 at 7378w4d was admitted to the hospital 10/02/2015 for induction of labor due to preeclampsia without severe features. Patient had a labor course significant for inability to tolerate labor after just 1 dose of cytotec, showing periods of absent variability with spontaneous late decelerations on FHT. The patient went for cesarean section due to Non-Reassuring FHR, and delivered a Viable infant female, Membrane Rupture Time/Date: )7:05 AM ,10/03/2015   @Details  of operation can be found in separate operative Note.  Patient had an uncomplicated postpartum course. She is ambulating, tolerating a regular diet, passing flatus, and urinating well.  Patient is discharged home in stable condition on 11/9.  For preeclampsia with severe features patient receive magnesium including 24 hours postpartum. BPs wnl the 24 hours prior to d/c so will not be discharged on antihypertensive. Will arrange baby love nursing visit for BP check and postpartum depression check-in. Will continue zoloft.                               Physical exam  Filed Vitals:   10/05/15 1215 10/05/15 1854 10/05/15 2238 10/06/15 0552  BP: 128/81 139/80 127/83 127/75  Pulse: 81 82 86 89  Temp: 98.6 F (37 C) 99.3 F (37.4 C) 99.2 F (37.3 C) 98.4 F (36.9 C)  TempSrc: Oral Oral Oral Oral  Resp: 18 16 18 18   Height:      Weight:      SpO2: 100% 100% 99% 100%   General: alert, cooperative and no distress Lochia: appropriate Uterine Fundus: firm Incision: Healing well with no significant drainage, Dressing is clean, dry, and intact DVT Evaluation: No evidence of DVT seen on physical exam. Negative Homan's sign. No significant calf/ankle edema. Labs: Lab Results  Component Value Date   WBC 10.1 10/04/2015   HGB 11.3* 10/04/2015   HCT 34.9* 10/04/2015   MCV 83.7 10/04/2015   PLT 194 10/04/2015   CMP Latest Ref Rng 10/02/2015  Glucose 65 - 99 mg/dL 409(W110(H)  BUN 6 - 20 mg/dL 9  Creatinine 1.190.44 - 1.471.00 mg/dL 8.290.66  Sodium 562135 - 130145 mmol/L 137  Potassium 3.5 - 5.1 mmol/L 3.8  Chloride 101 - 111 mmol/L 104  CO2 22 - 32 mmol/L  24  Calcium 8.9 - 10.3 mg/dL 9.5  Total Protein 6.5 - 8.1 g/dL 6.3(L)  Total Bilirubin 0.3 - 1.2 mg/dL 0.3  Alkaline Phos 38 - 126 U/L 155(H)  AST 15 - 41 U/L 24  ALT 14 - 54 U/L 21    Discharge instruction: per After Visit Summary and "Baby and Me Booklet".  After Visit Meds:    Medication List    STOP taking these medications        omeprazole 20 MG capsule  Commonly known as:  PRILOSEC     promethazine 25 MG tablet  Commonly known as:  PHENERGAN      TAKE these medications        ibuprofen 600 MG tablet  Commonly known as:  ADVIL,MOTRIN  Take 1 tablet (600 mg total) by mouth every 6 (six) hours as needed for mild pain.     oxyCODONE-acetaminophen 5-325 MG tablet  Commonly known as:  PERCOCET/ROXICET  Take 1 tablet by mouth every 4 (four) hours as needed (for pain scale 4-7).     Prenatal Vitamins 0.8 MG tablet  Take 1 tablet by mouth daily.      sertraline 50 MG tablet  Commonly known as:  ZOLOFT  Take 1 tablet (50 mg total) by mouth daily.        Diet: routine diet  Activity: Advance as tolerated. Pelvic rest for 6 weeks.   Outpatient follow up:6 weeks and consult for baby love nursing visit placed to follow-up blood pressure and depression Follow up Appt: Future Appointments Date Time Provider Department Center  11/15/2015 1:00 PM Aviva Signs, CNM [facility]   Follow up visit: No Follow-up on file.  Postpartum contraception: Nexplanon  Newborn Data: Live born female  Birth Weight: 4 lb 0.6 oz (1830 g) APGAR: 8, 9  Baby Feeding: Breast Disposition:NICU   10/06/2015 Jamelle Haring, MD  [facility], PGY-1   OB FELLOW DISCHARGE ATTESTATION  I have seen and examined this patient and agree with above documentation in the resident's note.   Silvano Bilis, MD 8:06 AM

## 2015-10-07 ENCOUNTER — Encounter: Payer: Self-pay | Admitting: Obstetrics & Gynecology

## 2015-10-08 ENCOUNTER — Ambulatory Visit (HOSPITAL_COMMUNITY): Payer: Self-pay

## 2015-10-08 ENCOUNTER — Ambulatory Visit: Payer: Self-pay

## 2015-10-08 NOTE — Lactation Note (Signed)
This note was copied from the chart of Alexandra Gray Cuartas. Lactation Consultation Note  Patient Name: Alexandra Gray Yin ZOXWR'UToday's Date: 10/08/2015 Reason for consult: Initial assessment;NICU baby  NICU baby 455 days old. Mom reports that she is getting more breast milk each time that she pumps. However, mom is pumping 4 times/24 hours. Enc mom to pump 8 times/24 hours for 15 minutes followed by hand expression. Discussed that she needs to stimulate breasts consistently, or her supply will start to decrease. Mom states that she wants to provide breast milk because she knows that it is best for baby.  Maternal Data    Feeding Feeding Type: Breast Milk with Formula added Nipple Type: Slow - flow Length of feed: 20 min  LATCH Score/Interventions                      Lactation Tools Discussed/Used     Consult Status Consult Status: PRN    Geralynn OchsWILLIARD, Becca Bayne 10/08/2015, 2:39 PM

## 2015-10-11 ENCOUNTER — Encounter (HOSPITAL_COMMUNITY): Payer: Self-pay | Admitting: *Deleted

## 2015-10-12 ENCOUNTER — Ambulatory Visit: Payer: Self-pay

## 2015-10-12 NOTE — Lactation Note (Signed)
This note was copied from the chart of Alexandra Gray. Lactation Consultation Note  Patient Name: Alexandra Gray ZOXWR'UToday's Date: 10/12/2015 Reason for consult: Follow-up assessment;NICU baby  NICU baby 399 hours old. Called to assist with latching baby in NICU. Demonstrated to mom how to compress breast to assist baby with latching. Baby attempted to latch directly to right breast in football position, and baby willing to suckle at breast, but not able to maintain a latch. Demonstrated to mom how to hand express in order to enc baby to achieve and maintain a deep latch, but after several unsuccessful attempts, fitted mom with a #16 NS. Baby able to latch deeply, suckling rhythmically with intermittent swallows noted for first 10 minutes. Baby continue to nurse, off-and-on with stimulation, for an additional 10 minutes with some swallows noted. Breast milk noted in NS in varying amounts at several points during feeding. Discussed with mom that NS a temporary device to transition baby directly to breast. Mom aware of OP/BFSG and LC phone line assistance after D/C. Discussed need to continue use DEBP and follow baby's weight gain carefully while using NS. Mom also given a #20 NS with instructions. Baby did very well at breast and mom pleased. Discussed with mom that baby may or may not do as well at the breast at the 1600 feeding. Enc mom to continue supplementing baby after breastfeeding until she is sure baby getting enough at breast. Discussed methods of making sure baby satisfied at breast. Maternal Data    Feeding Feeding Type: Breast Fed Length of feed: 20 min  LATCH Score/Interventions Latch: Grasps breast easily, tongue down, lips flanged, rhythmical sucking. Intervention(s): Adjust position;Assist with latch;Breast compression  Audible Swallowing: Spontaneous and intermittent Intervention(s): Skin to skin;Hand expression  Type of Nipple: Everted at rest and after stimulation  (short shaft.)  Comfort (Breast/Nipple): Soft / non-tender     Hold (Positioning): Assistance needed to correctly position infant at breast and maintain latch. Intervention(s): Support Pillows;Breastfeeding basics reviewed  LATCH Score: 9  Lactation Tools Discussed/Used Tools: Nipple Shields Nipple shield size: 16   Consult Status Consult Status: PRN    Geralynn OchsWILLIARD, Rehaan Viloria 10/12/2015, 12:51 PM

## 2015-10-12 NOTE — Lactation Note (Signed)
This note was copied from the chart of Alexandra Gray [place]. Lactation Consultation Note  Patient Name: Alexandra Gray Schult WUJWJ'XToday's Date: 10/12/2015 Reason for consult: Follow-up assessment;NICU baby NICU baby 699 days old. Came to assist with latching baby, but mom giving baby a bottle. Discussed supply and demand and the need to have baby at breast with each feeding. Enc mom to put baby to breast first with each feeding, and then supplement with EBM first, and then formula as needed. Enc mom to pump after feeding baby in order to protect supply and have EBM for supplementation. Discussed plan with patient's bedside RN Jacki ConesLaurie.  Maternal Data    Feeding    LATCH Score/Interventions                      Lactation Tools Discussed/Used     Consult Status Consult Status: PRN    Geralynn OchsWILLIARD, Daelen Belvedere 10/12/2015, 4:09 PM

## 2015-10-13 ENCOUNTER — Ambulatory Visit: Payer: Self-pay

## 2015-10-13 NOTE — Lactation Note (Addendum)
This note was copied from the chart of Girl Angela NevinCierra Pareja. Lactation Consultation Note  Patient Name: Girl Angela NevinCierra Howerter ZOXWR'UToday's Date: 10/13/2015 Reason for consult: Follow-up assessment;NICU baby NICU baby 8110 days old. Mom roomed in with baby last night and reports that it is "a lot of work" but she is going to continue trying to breastfeed her baby. Enc mom to keep putting baby to breast first, then supplementing with EBM/formula, then post-pumping after each feed. Enc mom to slowly move to having baby exclusively at breast as baby able to nurse and be satisfied at breast and not need supplementation. Mom states that she is comfortable with putting baby to breast, and understands use and how to move away from using NS. Mom aware of OP/BFSG and LC phone line assistance after D/C.   Maternal Data    Feeding    LATCH Score/Interventions                      Lactation Tools Discussed/Used     Consult Status Consult Status: PRN    Geralynn OchsWILLIARD, Areon Cocuzza 10/13/2015, 9:11 AM

## 2015-10-15 ENCOUNTER — Ambulatory Visit (HOSPITAL_COMMUNITY): Payer: Self-pay

## 2015-11-15 ENCOUNTER — Encounter: Payer: Self-pay | Admitting: Advanced Practice Midwife

## 2015-11-15 ENCOUNTER — Ambulatory Visit (INDEPENDENT_AMBULATORY_CARE_PROVIDER_SITE_OTHER): Payer: Medicaid Other | Admitting: Advanced Practice Midwife

## 2015-11-15 VITALS — BP 124/74 | HR 82 | Ht 61.0 in | Wt 251.1 lb

## 2015-11-15 DIAGNOSIS — Z3202 Encounter for pregnancy test, result negative: Secondary | ICD-10-CM

## 2015-11-15 DIAGNOSIS — Z98891 History of uterine scar from previous surgery: Secondary | ICD-10-CM | POA: Diagnosis not present

## 2015-11-15 DIAGNOSIS — Z30017 Encounter for initial prescription of implantable subdermal contraceptive: Secondary | ICD-10-CM

## 2015-11-15 LAB — POCT PREGNANCY, URINE: Preg Test, Ur: NEGATIVE

## 2015-11-15 MED ORDER — ETONOGESTREL 68 MG ~~LOC~~ IMPL
68.0000 mg | DRUG_IMPLANT | Freq: Once | SUBCUTANEOUS | Status: AC
  Administered 2015-11-15: 68 mg via SUBCUTANEOUS

## 2015-11-15 NOTE — Progress Notes (Signed)
Patient ID: Alexandra GettingCierra R Petter, female   DOB: 1991-07-24, 24 y.o.   MRN: [id number] Subjective:     Alexandra Gray is a 24 y.o. female who presents for a postpartum visit. She is 6 weeks postpartum following a low cervical transverse Cesarean section. I have fully reviewed the prenatal and intrapartum course. The delivery was at 37 gestational weeks. Outcome: primary cesarean section, low transverse incision. Anesthesia: spinal.   Postpartum course has been uneventful. Baby's course has been uneventful since discharge from NICU, breastfeeding well. Baby is feeding by both breast and bottle - Similac Neosure. Bleeding no bleeding.   Bowel function is normal. Bladder function is normal. Patient is not sexually active. Contraception method is none. Postpartum depression screening: negative.  The following portions of the patient's history were reviewed and updated as appropriate: allergies, current medications, past family history, past medical history, past social history, past surgical history and problem list.  Review of Systems Pertinent items are noted in HPI.   Objective:    There were no vitals taken for this visit.  General:  alert, cooperative and no distress   Breasts:  inspection negative, no nipple discharge or bleeding, no masses or nodularity palpable  Lungs: clear to auscultation bilaterally  Heart:  regular rate and rhythm, S1, S2 normal, no murmur, click, rub or gallop  Abdomen: soft, non-tender; bowel sounds normal; no masses,  no organomegaly   Low transverse skin incision is very well healed and almost invisible!   Vulva:  not evaluated  Vagina: not evaluated  Cervix:  n/a  Corpus: not examined  Adnexa:  not evaluated  Rectal Exam: Not performed.        Patient given informed consent, she signed consent form. Pregnancy test was negative.  Appropriate time out taken.  Patient's left arm was prepped and draped in the usual sterile fashion.. The ruler used to measure and  mark insertion area.  Patient was prepped with betadine swab and then injected with 5 ml of 1 % lidocaine.  She was prepped with betadine, Nexplanon removed from packaging,  Device confirmed in needle, then inserted full length of needle and withdrawn per handbook instructions.  There was minimal blood loss.  Patient insertion site covered with guaze and a pressure bandage to reduce any bruising.  The patient tolerated the procedure well and was given post procedure instructions.  Assessment:     Normal postpartum exam. Pap smear not done at today's visit.   Plan:    1. Contraception: Nexplanon 2. Discussed possible irregular bleeding with Nexplanon 3. Follow up in: 1 year or as needed.

## 2015-11-15 NOTE — Patient Instructions (Signed)
Etonogestrel implant What is this medicine? ETONOGESTREL (et oh noe JES trel) is a contraceptive (birth control) device. It is used to prevent pregnancy. It can be used for up to 3 years. This medicine may be used for other purposes; ask your health care provider or pharmacist if you have questions. What should I tell my health care provider before I take this medicine? They need to know if you have any of these conditions: -abnormal vaginal bleeding -blood vessel disease or blood clots -cancer of the breast, cervix, or liver -depression -diabetes -gallbladder disease -headaches -heart disease or recent heart attack -high blood pressure -high cholesterol -kidney disease -liver disease -renal disease -seizures -tobacco smoker -an unusual or allergic reaction to etonogestrel, other hormones, anesthetics or antiseptics, medicines, foods, dyes, or preservatives -pregnant or trying to get pregnant -breast-feeding How should I use this medicine? This device is inserted just under the skin on the inner side of your upper arm by a health care professional. Talk to your pediatrician regarding the use of this medicine in children. Special care may be needed. Overdosage: If you think you have taken too much of this medicine contact a poison control center or emergency room at once. NOTE: This medicine is only for you. Do not share this medicine with others. What if I miss a dose? This does not apply. What may interact with this medicine? Do not take this medicine with any of the following medications: -amprenavir -bosentan -fosamprenavir This medicine may also interact with the following medications: -barbiturate medicines for inducing sleep or treating seizures -certain medicines for fungal infections like ketoconazole and itraconazole -griseofulvin -medicines to treat seizures like carbamazepine, felbamate, oxcarbazepine, phenytoin,  topiramate -modafinil -phenylbutazone -rifampin -some medicines to treat HIV infection like atazanavir, indinavir, lopinavir, nelfinavir, tipranavir, ritonavir -St. John's wort This list may not describe all possible interactions. Give your health care provider a list of all the medicines, herbs, non-prescription drugs, or dietary supplements you use. Also tell them if you smoke, drink alcohol, or use illegal drugs. Some items may interact with your medicine. What should I watch for while using this medicine? This product does not protect you against HIV infection (AIDS) or other sexually transmitted diseases. You should be able to feel the implant by pressing your fingertips over the skin where it was inserted. Contact your doctor if you cannot feel the implant, and use a non-hormonal birth control method (such as condoms) until your doctor confirms that the implant is in place. If you feel that the implant may have broken or become bent while in your arm, contact your healthcare provider. What side effects may I notice from receiving this medicine? Side effects that you should report to your doctor or health care professional as soon as possible: -allergic reactions like skin rash, itching or hives, swelling of the face, lips, or tongue -breast lumps -changes in emotions or moods -depressed mood -heavy or prolonged menstrual bleeding -pain, irritation, swelling, or bruising at the insertion site -scar at site of insertion -signs of infection at the insertion site such as fever, and skin redness, pain or discharge -signs of pregnancy -signs and symptoms of a blood clot such as breathing problems; changes in vision; chest pain; severe, sudden headache; pain, swelling, warmth in the leg; trouble speaking; sudden numbness or weakness of the face, arm or leg -signs and symptoms of liver injury like dark yellow or brown urine; general ill feeling or flu-like symptoms; light-colored stools; loss of  appetite; nausea; right upper belly   pain; unusually weak or tired; yellowing of the eyes or skin -unusual vaginal bleeding, discharge -signs and symptoms of a stroke like changes in vision; confusion; trouble speaking or understanding; severe headaches; sudden numbness or weakness of the face, arm or leg; trouble walking; dizziness; loss of balance or coordination Side effects that usually do not require medical attention (Report these to your doctor or health care professional if they continue or are bothersome.): -acne -back pain -breast pain -changes in weight -dizziness -general ill feeling or flu-like symptoms -headache -irregular menstrual bleeding -nausea -sore throat -vaginal irritation or inflammation This list may not describe all possible side effects. Call your doctor for medical advice about side effects. You may report side effects to FDA at 1-800-FDA-1088. Where should I keep my medicine? This drug is given in a hospital or clinic and will not be stored at home. NOTE: This sheet is a summary. It may not cover all possible information. If you have questions about this medicine, talk to your doctor, pharmacist, or health care provider.    2016, [employer]vier/[employer] Standard. (2014-08-28 14:07:06)  

## 2015-12-29 ENCOUNTER — Other Ambulatory Visit: Payer: Self-pay | Admitting: Internal Medicine

## 2016-01-19 ENCOUNTER — Encounter: Payer: Self-pay | Admitting: *Deleted

## 2016-05-07 ENCOUNTER — Other Ambulatory Visit: Payer: Self-pay | Admitting: Obstetrics & Gynecology

## 2016-08-20 ENCOUNTER — Encounter: Payer: Self-pay | Admitting: Obstetrics & Gynecology

## 2016-08-22 ENCOUNTER — Telehealth: Payer: Self-pay | Admitting: *Deleted

## 2016-08-22 NOTE — Telephone Encounter (Addendum)
Pt left My Chart message regarding a burning sensation she has in her arm at the location of Nexplanon. I called to speak to her about this and left a message on her personal voicemail with some questions. I asked her to respond via My chart and also sent message via that portal as well.   10/11  1110  Pt has not yet responded to the My Chart message sent on 9/26.  I called her today and left a message on her voice mail stating that I am following up on her call from 2 weeks ago. Please review the message sent to her My Chart account and respond.

## 2016-09-06 ENCOUNTER — Encounter: Payer: Self-pay | Admitting: Obstetrics & Gynecology

## 2016-12-06 ENCOUNTER — Encounter: Payer: Self-pay | Admitting: Advanced Practice Midwife

## 2016-12-06 ENCOUNTER — Ambulatory Visit (INDEPENDENT_AMBULATORY_CARE_PROVIDER_SITE_OTHER): Payer: Medicaid Other | Admitting: Advanced Practice Midwife

## 2016-12-06 VITALS — BP 133/102 | HR 92 | Wt 297.0 lb

## 2016-12-06 DIAGNOSIS — Z8619 Personal history of other infectious and parasitic diseases: Secondary | ICD-10-CM

## 2016-12-06 DIAGNOSIS — Z8759 Personal history of other complications of pregnancy, childbirth and the puerperium: Secondary | ICD-10-CM

## 2016-12-06 DIAGNOSIS — Z3046 Encounter for surveillance of implantable subdermal contraceptive: Secondary | ICD-10-CM

## 2016-12-06 DIAGNOSIS — Z3049 Encounter for surveillance of other contraceptives: Secondary | ICD-10-CM

## 2016-12-06 DIAGNOSIS — Z8679 Personal history of other diseases of the circulatory system: Secondary | ICD-10-CM | POA: Insufficient documentation

## 2016-12-06 HISTORY — DX: Personal history of other complications of pregnancy, childbirth and the puerperium: Z87.59

## 2016-12-06 NOTE — Patient Instructions (Signed)

## 2016-12-06 NOTE — Progress Notes (Signed)
   Subjective:    Patient ID: Alexandra Gray, female    DOB: [birth year], 26 y.o.   MRN: [id number]  Other  This is a new problem. Associated symptoms include headaches. Pertinent negatives include no abdominal pain, chest pain, chills, fatigue, fever, myalgias, nausea, urinary symptoms, visual change or vomiting. Nothing aggravates the symptoms. She has tried nothing for the symptoms.   This is a 26 y.o. female G1P1001 who presents requesting her Nexplanon be removed Thinks it has made her gain weight and have headaches. Worried it has made her hypertension worse Even though we reviewed these are not likely she still wants it removed Plans no contraception until she loses weight. Plans condoms but states is abstinent      Review of Systems  Constitutional: Negative for chills, fatigue and fever.  Cardiovascular: Negative for chest pain.  Gastrointestinal: Negative for abdominal pain, nausea and vomiting.  Musculoskeletal: Negative for myalgias.  Neurological: Positive for headaches.       Objective:   Physical Exam  Constitutional: She is oriented to person, place, and time. She appears well-developed and well-nourished. No distress.  HENT:  Head: Normocephalic.  Cardiovascular: Normal rate and regular rhythm.   Pulmonary/Chest: Effort normal and breath sounds normal.  Abdominal: Soft. She exhibits no distension and no mass. There is no tenderness. There is no rebound and no guarding.  Musculoskeletal: Normal range of motion.  Neurological: She is alert and oriented to person, place, and time.  Skin: Skin is warm and dry.  Nexplanon rod palpable in left upper arm, though it is deep.  Tip of it is palpable  Psychiatric: She has a normal mood and affect.    Consent obtained and Time-Out conducted Implant palpated in left upper arm Betadine prep done on area of excision/removal Lidocaine infiltrated into intradermal and subcutaneous space Small 2mm incision made with  scalpel Pressure applied to distal end of implant which exposed the tip through incision    There was some difficulty getting it to pop out. Dr Vergie LivingPickens consulted and helped get tip out and then the Tip of implant grasped with hemostat There was no adherence of implant in subcutaneous tissue.  Twisting and manipulation freed the implant from the capsule Implant removed Pressure held on incision until bleeding stopped Steristrips applied to incision Pressure dressing applied by RN Patient tolerated procedure well.         Assessment & Plan:  Desires removal of Nexplanon  Removed Discussed other methods of contraception Mirena IUD might be a good option for her.  Info provided RTC prn

## 2017-03-09 ENCOUNTER — Ambulatory Visit: Payer: Medicaid Other

## 2017-03-20 IMAGING — US US ART/VEN ABD/PELV/SCROTUM DOPPLER LTD
1 series · 13 of 25 positions shown · non-contrast
Comparison: None.

CLINICAL DATA: Initial evaluation for acute pelvic pain for 2
weeks.

EXAM:
TRANSABDOMINAL AND TRANSVAGINAL ULTRASOUND OF PELVIS
DOPPLER ULTRASOUND OF OVARIES
TECHNIQUE: Both transabdominal and transvaginal ultrasound examinations of the
pelvis were performed. Transabdominal technique was performed for
global imaging of the pelvis including uterus, ovaries, adnexal
regions, and pelvic cul-de-sac.
It was necessary to proceed with endovaginal exam following the
transabdominal exam to visualize the uterus and ovaries. Color and
duplex Doppler ultrasound was utilized to evaluate blood flow to the
ovaries.

[Series 1: us art/ven abd/pelv/scrotum doppler ltd · 0.24mm/px · 60 acquisitions, 13 frames shown]
[im 1/60]
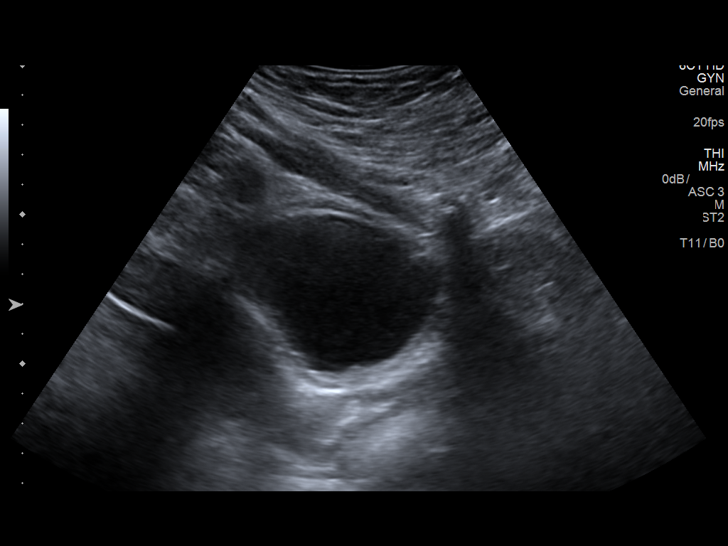
[im 5/60]
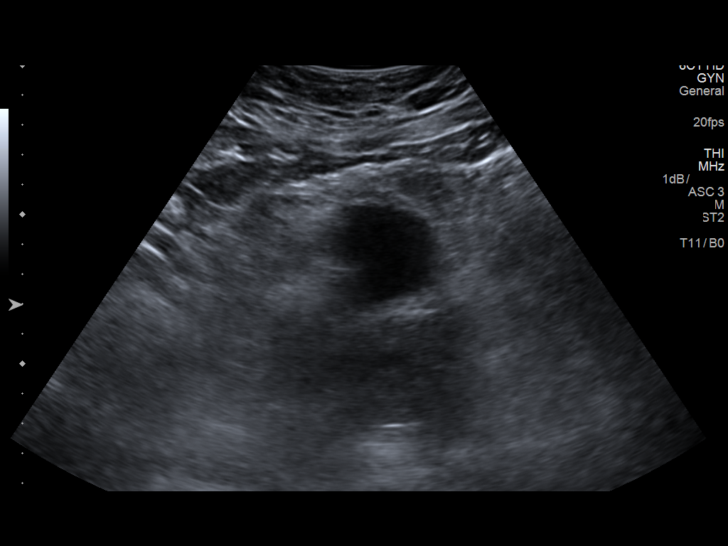
[im 10/60]
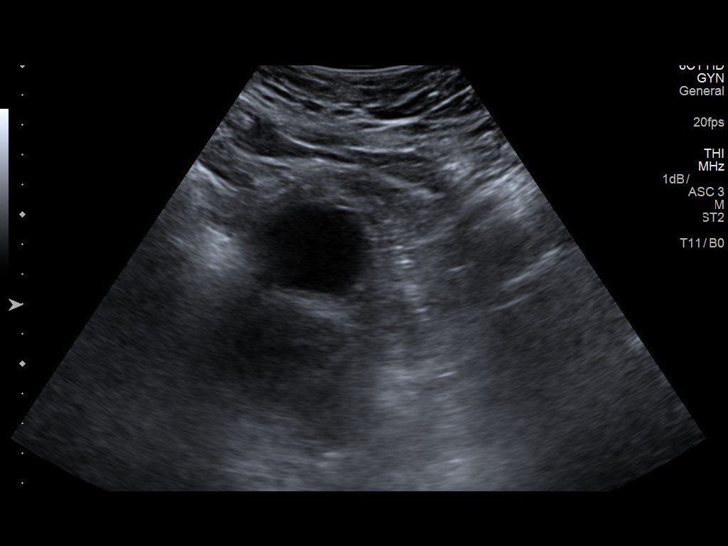
[im 15/60]
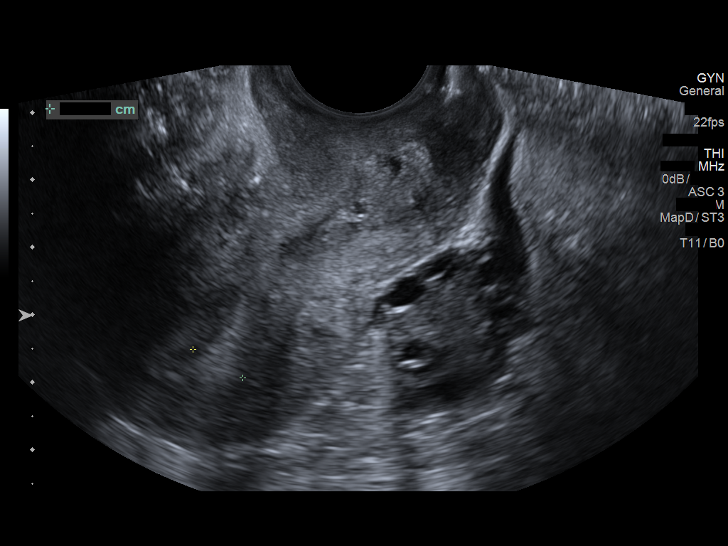
[im 20/60]
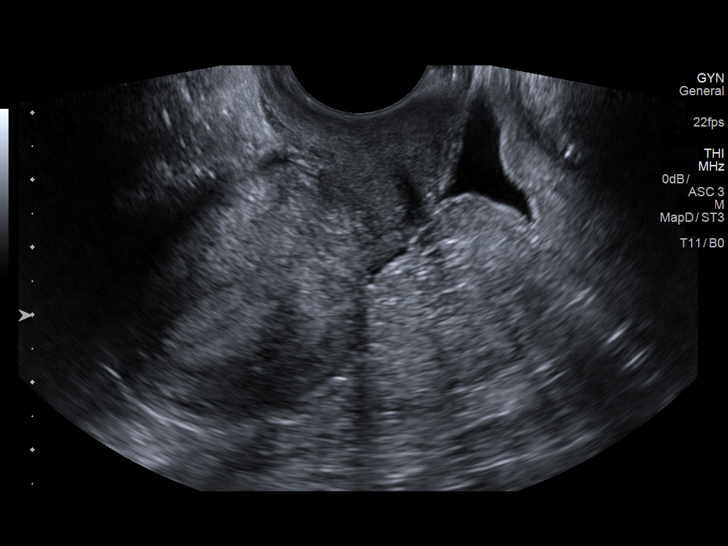
[im 25/60]
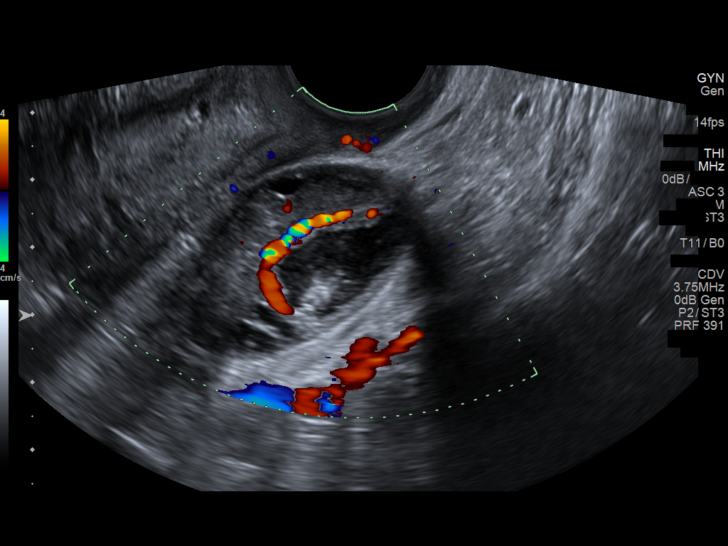
[im 30/60]
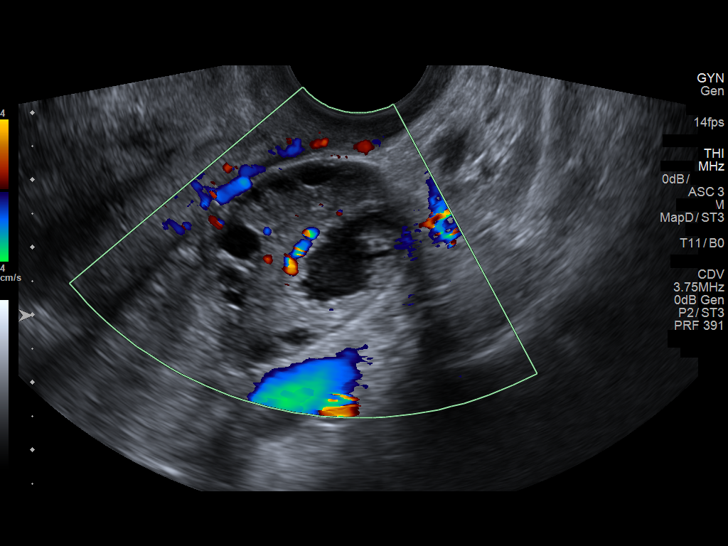
[im 35/60]
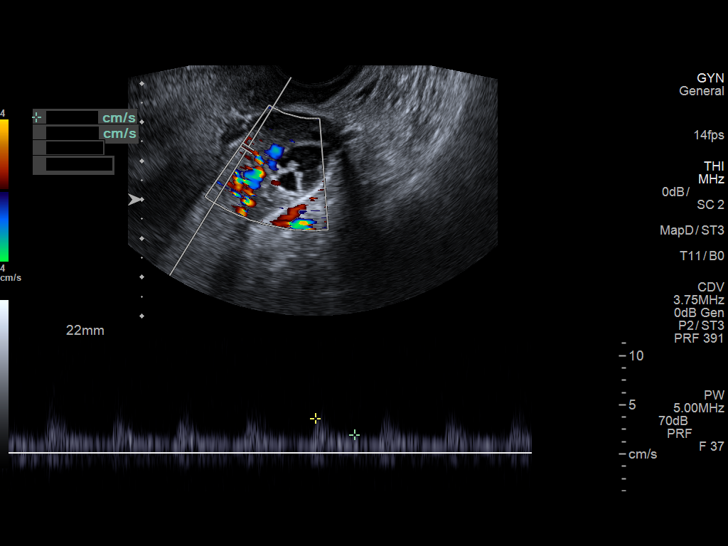
[im 40/60]
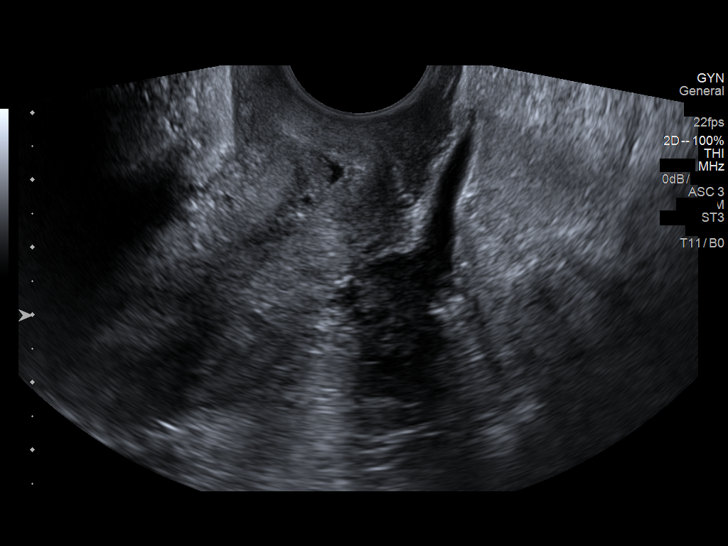
[im 45/60]
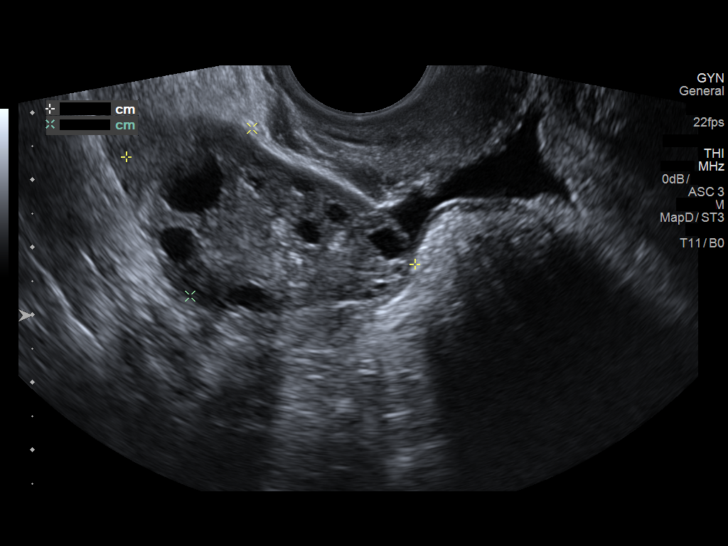
[im 50/60]
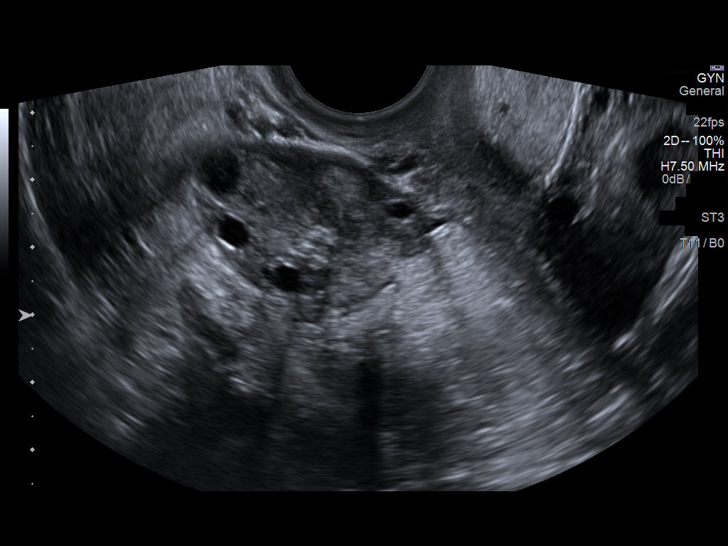
[im 55/60]
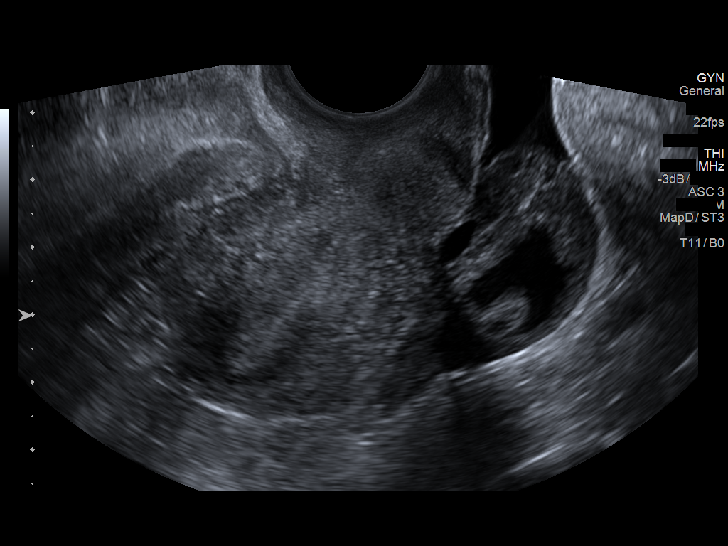
[im 60/60]
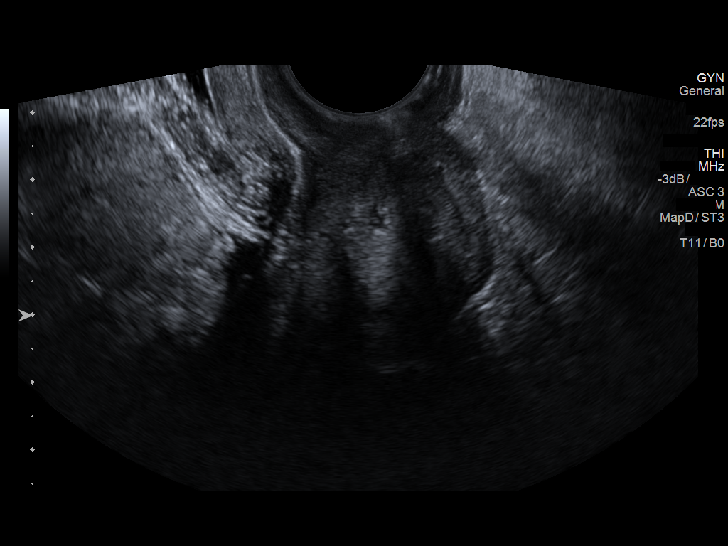

[13 of 25 positions shown; findings below may reference images not displayed]

FINDINGS: Uterus

Measurements: 5.9 x 3.0 x 3.9 cm. No fibroids or other mass
visualized.

Endometrium

Thickness: 11.6 mm.  No focal abnormality visualized.

Right ovary

Measurements: 4.6 x 2.7 x 3.1 cm. Normal appearance/no adnexal mass.

Left ovary

Measurements: 3.6 x 2.8 x 3.7 cm. 2.4 x 1.7 x 2.4 cm complex cystic
lesion with peripherally increased vascularity, most consistent with
a degenerating corpus luteal cyst.

Pulsed Doppler evaluation of both ovaries demonstrates normal
low-resistance arterial and venous waveforms.

Other findings

Small volume free fluid within the pelvis, most likely physiologic.
IMPRESSION: 1. 2.4 cm degenerating left ovarian corpus luteal cyst with small
volume free physiologic fluid within the pelvis.
2. Otherwise unremarkable pelvic ultrasound. No evidence for
torsion.

## 2017-04-02 ENCOUNTER — Emergency Department (HOSPITAL_COMMUNITY)
Admission: EM | Admit: 2017-04-02 | Discharge: 2017-04-02 | Disposition: A | Discharge status: Home / Self Care | Payer: Medicaid Other | Attending: Emergency Medicine | Admitting: Emergency Medicine

## 2017-04-02 ENCOUNTER — Emergency Department (HOSPITAL_COMMUNITY): Payer: Medicaid Other

## 2017-04-02 ENCOUNTER — Encounter (HOSPITAL_COMMUNITY): Payer: Self-pay | Admitting: *Deleted

## 2017-04-02 DIAGNOSIS — M25562 Pain in left knee: Secondary | ICD-10-CM | POA: Insufficient documentation

## 2017-04-02 DIAGNOSIS — M25572 Pain in left ankle and joints of left foot: Secondary | ICD-10-CM | POA: Insufficient documentation

## 2017-04-02 DIAGNOSIS — I10 Essential (primary) hypertension: Secondary | ICD-10-CM | POA: Insufficient documentation

## 2017-04-02 DIAGNOSIS — Z79899 Other long term (current) drug therapy: Secondary | ICD-10-CM | POA: Diagnosis not present

## 2017-04-02 IMAGING — CR DG ANKLE COMPLETE 3+V*L*
3 series · 3 of 3 positions shown · non-contrast
Comparison: None.

CLINICAL DATA: Left ankle pain after twisting injury 2 months ago.

EXAM:
LEFT ANKLE COMPLETE - 3+ VIEW

[ankle ap]
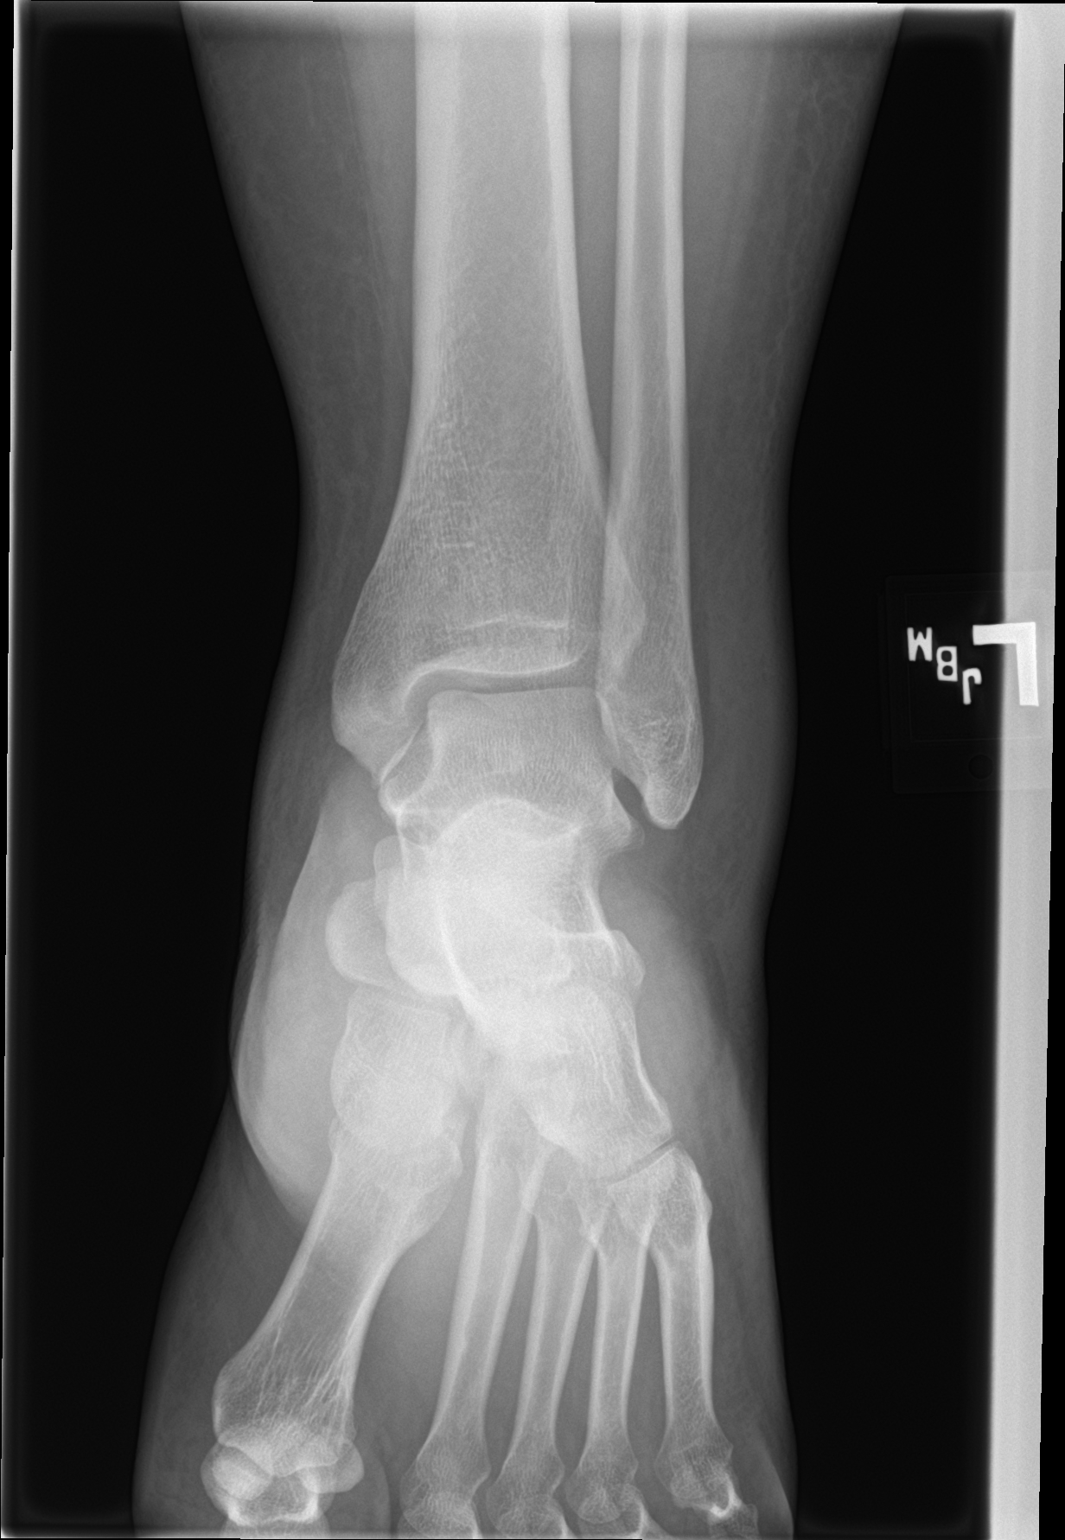

[ankle obl]
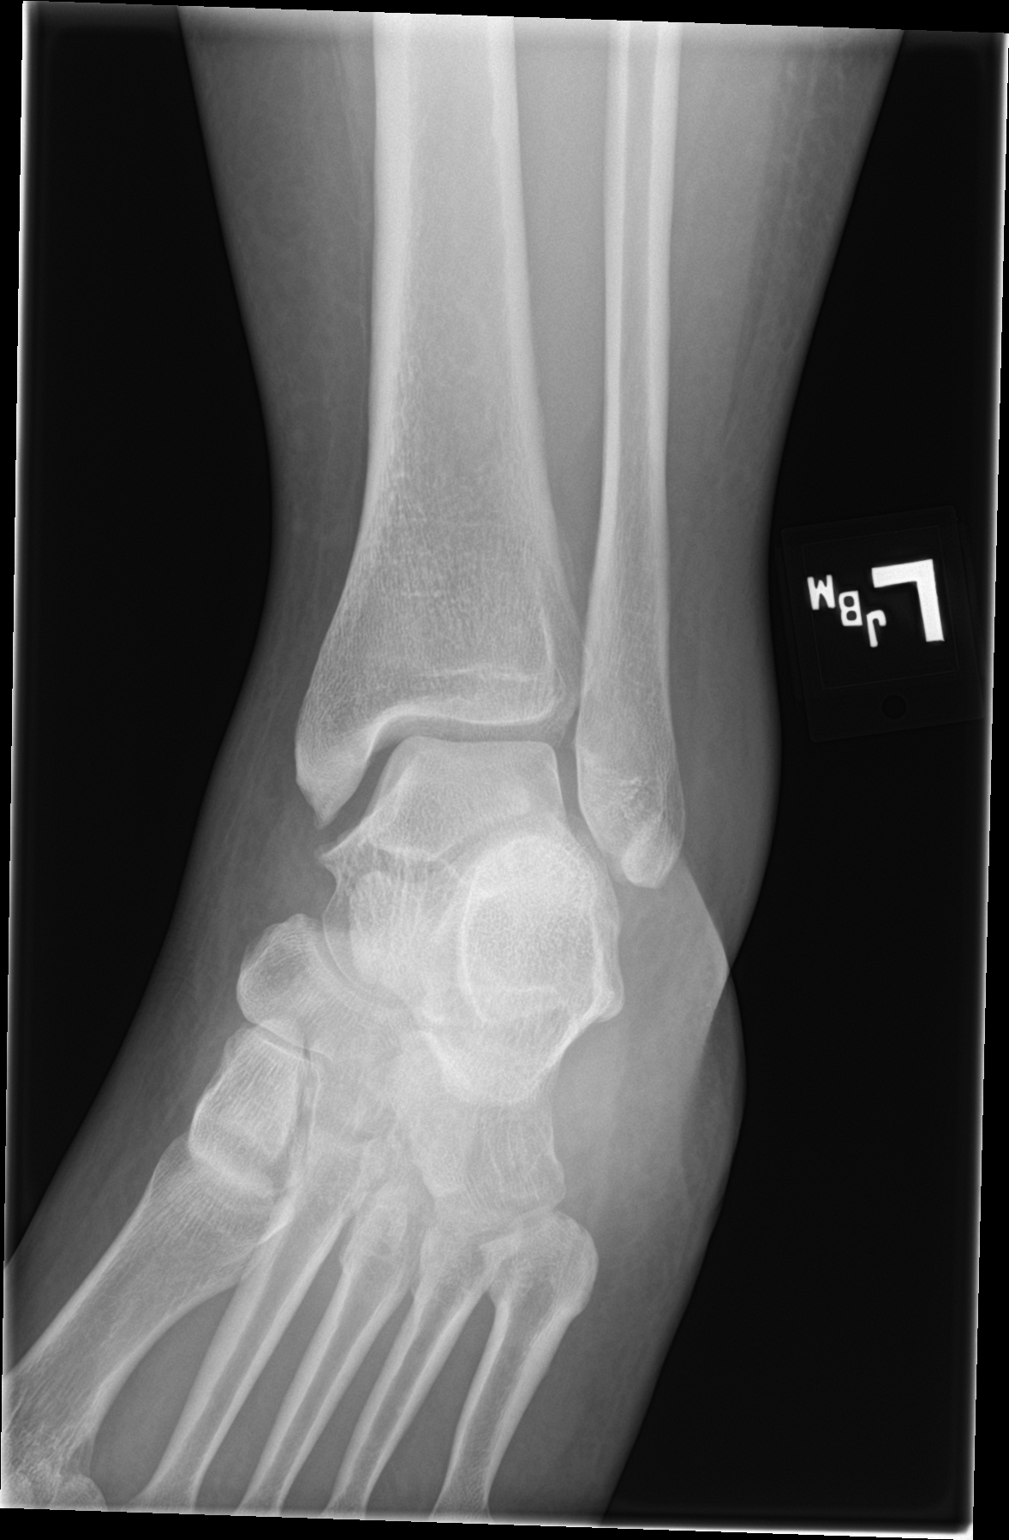

[ankle lat]
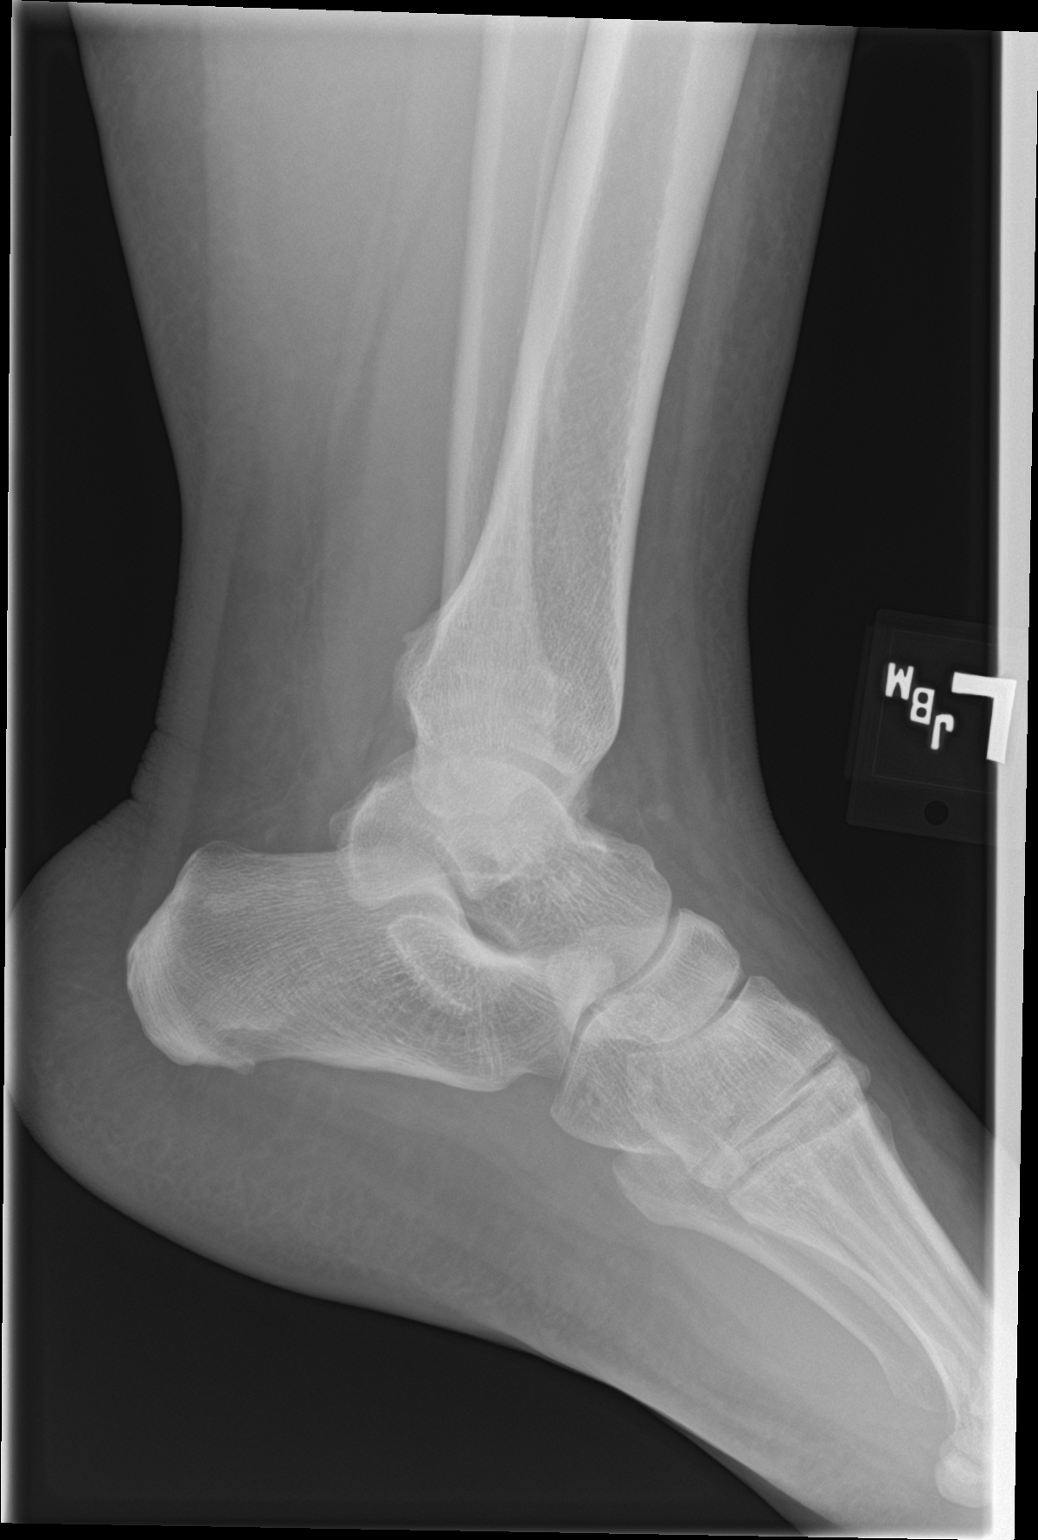

[3 of 3 positions shown; findings below may reference images not displayed]

FINDINGS: There is no evidence of fracture, dislocation, or joint effusion.
There is no evidence of arthropathy or other focal bone abnormality.
Mild soft tissue swelling is seen over lateral malleolus.
IMPRESSION: No fracture or dislocation is noted. Mild soft tissue swelling seen
over lateral malleolus suggesting ligamentous injury.

## 2017-04-02 IMAGING — CR DG KNEE COMPLETE 4+V*L*
4 series · 5 of 5 positions shown · non-contrast
Comparison: None.

CLINICAL DATA: Remote history of injury.  Fall 2 months ago.

EXAM:
LEFT KNEE - COMPLETE 4+ VIEW

[knee ap]
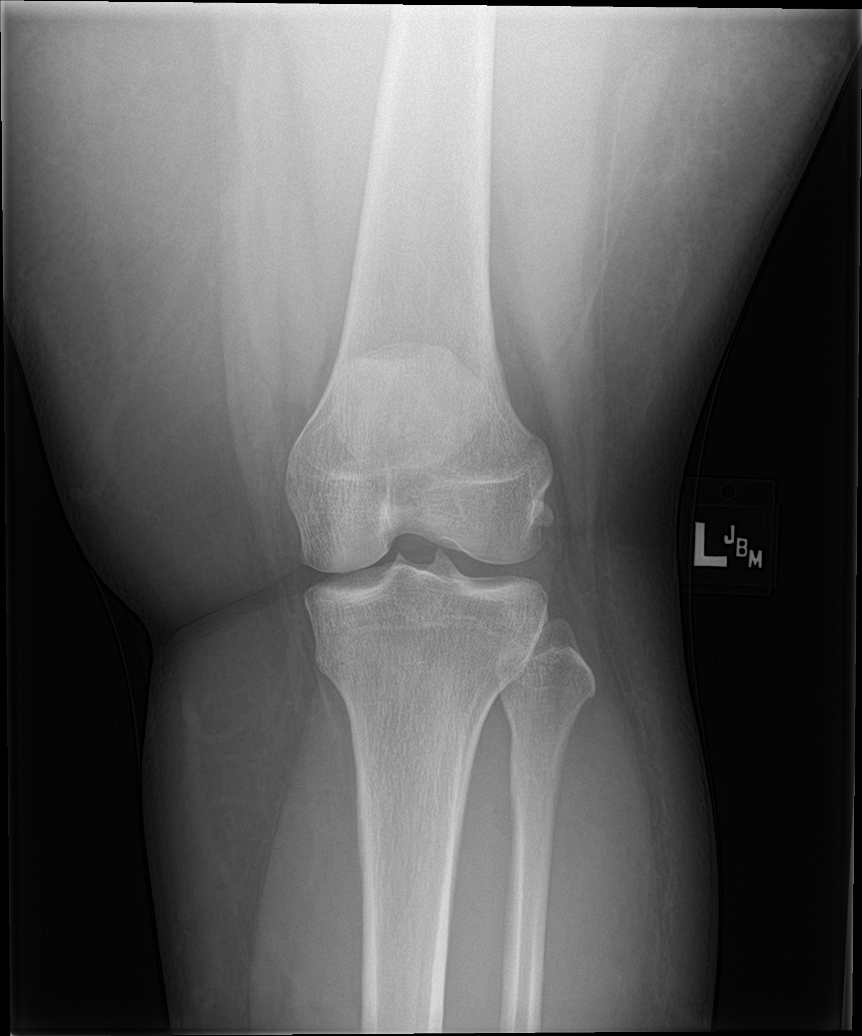

[Series 3: knee lat · 0.14mm/px · 2 of 2 slices shown]
[im 1/2]
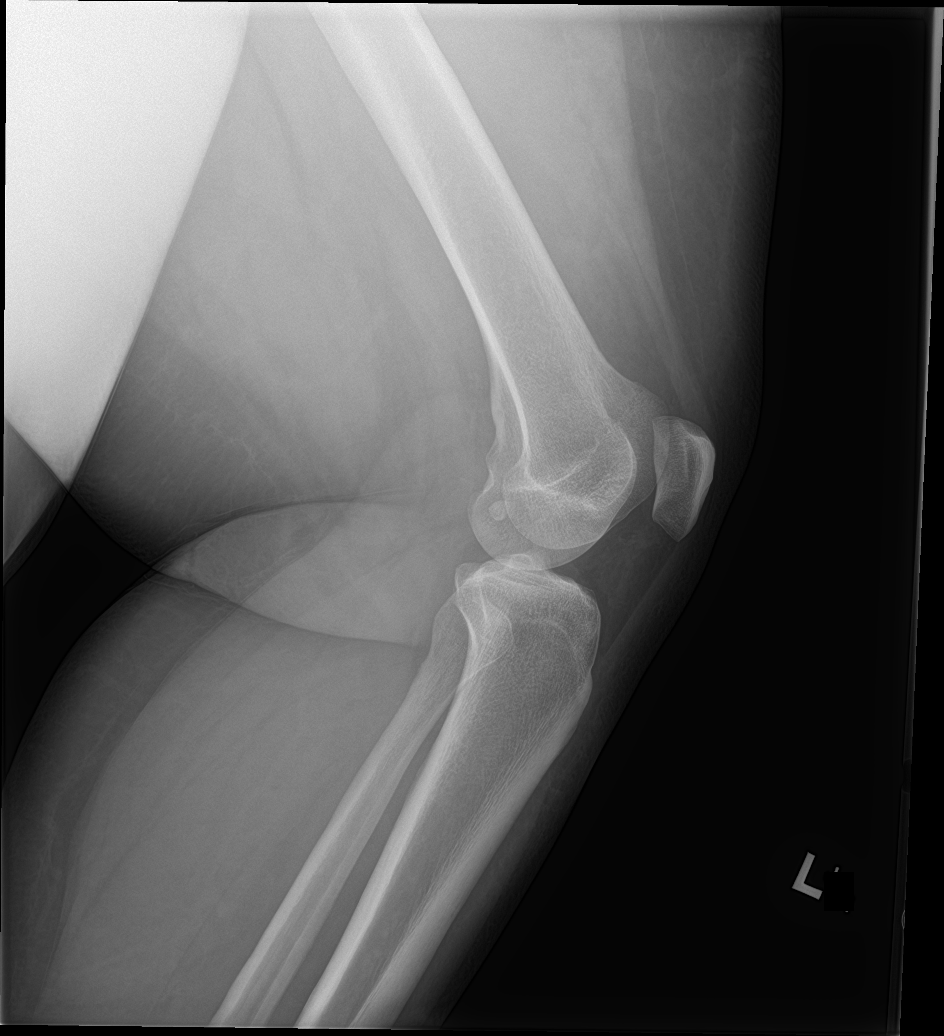
[im 2/2]
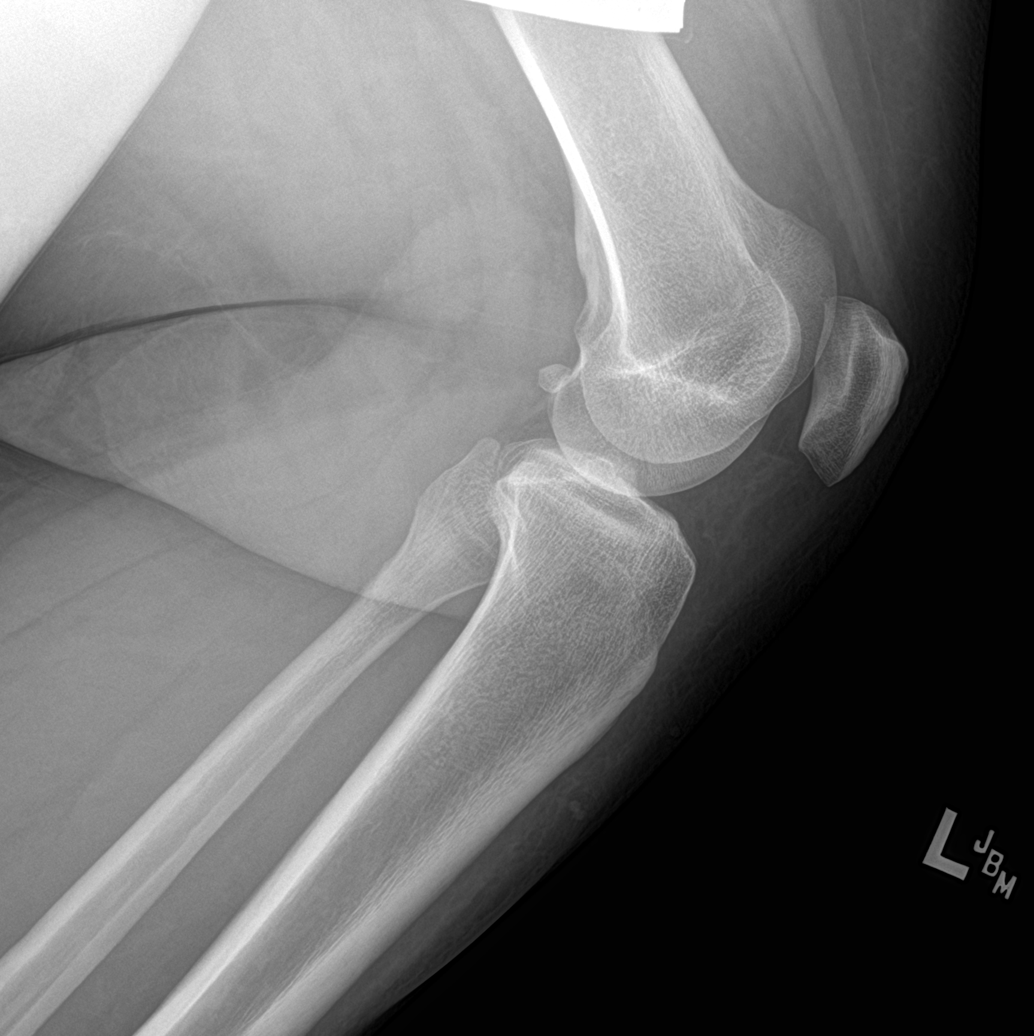

[knee obl (1 of 2)]
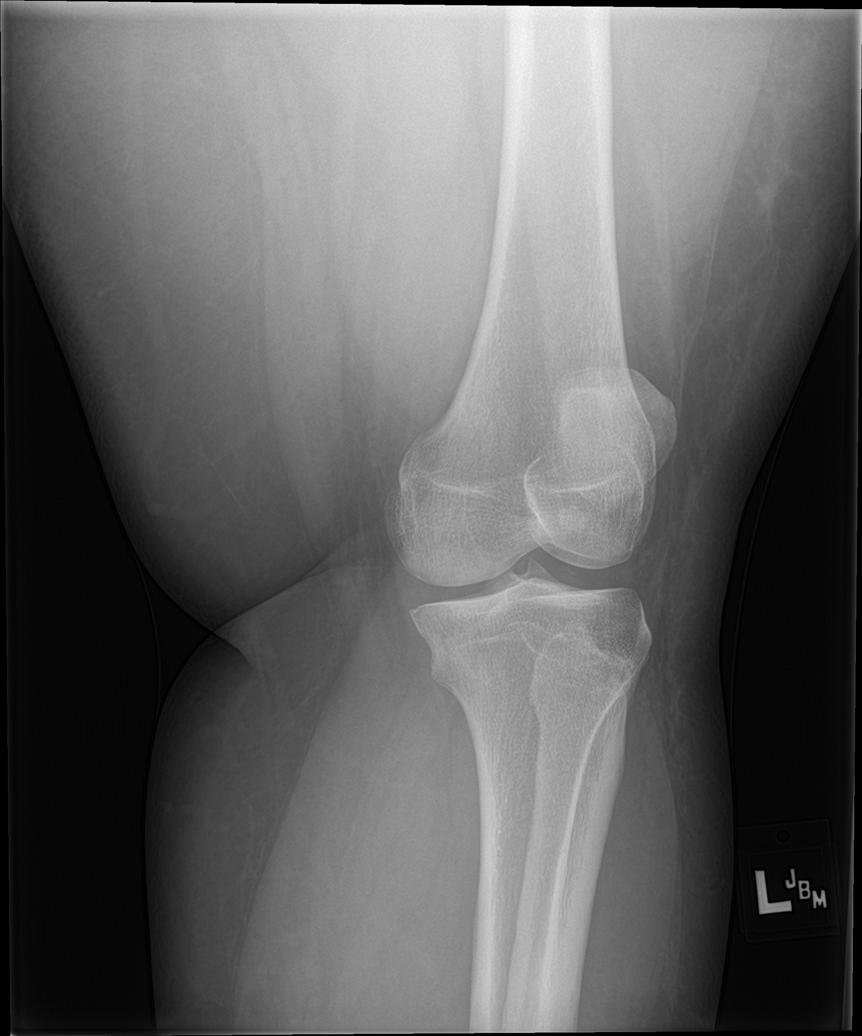

[knee obl (2 of 2)]
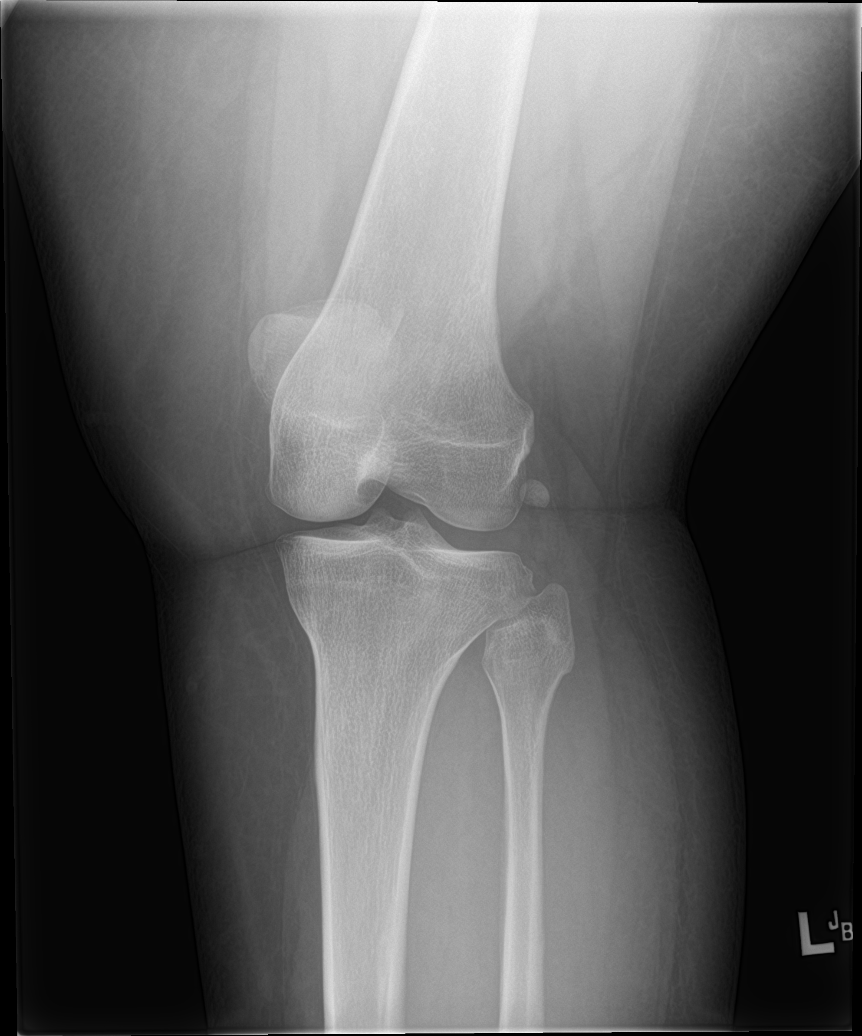

[5 of 5 positions shown; findings below may reference images not displayed]

FINDINGS: No evidence of fracture, dislocation, or joint effusion. No evidence
of arthropathy or other focal bone abnormality. Soft tissues are
unremarkable.
IMPRESSION: Negative.

## 2017-04-02 MED ORDER — IBUPROFEN 800 MG PO TABS: 800.0000 mg | ORAL_TABLET | Freq: Once | ORAL | Status: DC

## 2017-04-02 MED ORDER — NAPROXEN 250 MG PO TABS
500.0000 mg | ORAL_TABLET | Freq: Once | ORAL | Status: AC
  Administered 2017-04-02: 500 mg via ORAL
  Filled 2017-04-02: qty 2

## 2017-04-02 MED ORDER — NAPROXEN 500 MG PO TABS: 500.0000 mg | ORAL_TABLET | Freq: Two times a day (BID) | ORAL | 0 refills | Status: DC

## 2017-04-02 NOTE — ED Notes (Signed)
Patient transported to X-ray 

## 2017-04-02 NOTE — ED Notes (Signed)
Declined W/C at D/C and was escorted to lobby by RN. 

## 2017-04-02 NOTE — Discharge Instructions (Signed)
Take Naprosyn twice daily for your pain. You can alternate with Tylenol as prescribed over-the-counter. Use ice on your knee and ankle 3-4 times daily alternating 20 minutes on, 20 minutes off. Keep your leg elevated whenever you're not walking on it. Use crutches carefully to ambulate. You can begin to partially bear weight as tolerated. Please follow-up with your orthopedic doctor for further evaluation and treatment of your injuries. Please return to emergency department if you develop any new or worsening symptoms including fever and redness of either joint.

## 2017-04-02 NOTE — ED Provider Notes (Signed)
[facility]EM[facility]Y DEPT Provider Note   CSN: [id number] Arrival date & time: 04/02/17  4098   By signing my name below, I, Alexandra Gray, attest that this documentation has been prepared under the direction and in the presence of Buel Ream, PA-C. Electronically Signed: Bobbie Gray, Scribe. 04/02/17. 11:20 AM. History   Chief Complaint Chief Complaint  Patient presents with  . Knee Pain  . Foot Pain    The history is provided by the patient. No language interpreter was used.  HPI Comments: Alexandra Gray is a 26 y.o. female who presents to the Emergency Department complaining of left knee and ankle pain for the past 2 months. She states that she twisted her ankle and knee inward at that time and has been having pain and swelling since. She states that the pain began to worsen recently. She rates her pain currently 10/10. She also reports intermittent left leg numbness and tingling. She has tried taking ibuprofen with no significant relief. She has not taken any medications today. She reports no other injuries. She has a hx of left knee problems. She states that she had shattered her left knee in the past and had surgery. She has not seen an orthopedic since last year. She denies hip pain or any fevers.  Past Medical History:  Diagnosis Date  . Chlamydia   . Depression    doing ok now  . Gonorrhea   . Hypertension    sometimes is up, never on meds  . Mental disorder   . Ovarian cyst   . PID (pelvic inflammatory disease)   . Sleep apnea     Patient Active Problem List   Diagnosis Date Noted  . History of pregnancy induced hypertension 12/06/2016  . History of trichomonal vaginitis 12/06/2016  . S/P primary low transverse C-section 10/03/2015  . 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 Suicidal ideation 08/04/2013    Past Surgical History:  Procedure Laterality Date  . CESAREAN SECTION N/A 10/03/2015   Procedure:  CESAREAN SECTION;  Surgeon: Tilda Burrow, MD;  Location: [facility];  Service: Obstetrics;  Laterality: N/A;  . KNEE SURGERY      OB History    Gravida Para Term Preterm AB Living   1 1 1     1    SAB TAB Ectopic Multiple Live Births         0 1       Home Medications    Prior to Admission medications   Medication Sig Start Date End Date Taking? Authorizing Provider  ibuprofen (ADVIL,MOTRIN) 600 MG tablet Take 1 tablet (600 mg total) by mouth every 6 (six) hours as needed for mild pain. Patient not taking: Reported on 12/06/2016 10/06/15   Casey Burkitt, MD  naproxen (NAPROSYN) 500 MG tablet Take 1 tablet (500 mg total) by mouth 2 (two) times daily. 04/02/17   Collan Schoenfeld, Waylan Boga, PA-C  oxyCODONE-acetaminophen (PERCOCET/ROXICET) 5-325 MG tablet Take 1 tablet by mouth every 4 (four) hours as needed (for pain scale 4-7). Patient not taking: Reported on 12/06/2016 10/06/15   Casey Burkitt, MD  Prenatal Multivit-Min-Fe-FA (PRENATAL VITAMINS) 0.8 MG tablet Take 1 tablet by mouth daily. Patient not taking: Reported on 12/06/2016 02/12/15   Clemmons, Elmore Guise, CNM  sertraline (ZOLOFT) 50 MG tablet TAKE ONE TABLET BY MOUTH ONCE DAILY Patient not taking: Reported on 12/06/2016 05/08/16   Tereso Newcomer, MD    Family History Family History  Problem Relation Age  of Onset  . Cancer Neg Hx   . Diabetes Neg Hx   . Heart disease Neg Hx   . Hypertension Neg Hx   . Stroke Neg Hx   . Hearing loss Neg Hx     Social History Social History  Substance Use Topics  . Smoking status: Never Smoker  . Smokeless tobacco: Never Used  . Alcohol use No     Allergies   Patient has no known allergies.   Review of Systems Review of Systems  Constitutional: Negative for fever.  Genitourinary: Negative for pelvic pain.  Musculoskeletal: Positive for arthralgias.       Left knee and ankle pain.  Neurological: Positive for numbness.     Physical Exam Updated Vital Signs BP (!)  115/98 (BP Location: Left Arm)   Pulse 86   Temp 98.9 F (37.2 C) (Oral)   Resp 18   LMP 02/25/2017   SpO2 100%   Physical Exam  Constitutional: She appears well-developed and well-nourished. No distress.  HENT:  Head: Normocephalic and atraumatic.  Mouth/Throat: Oropharynx is clear and moist. No oropharyngeal exudate.  Eyes: Conjunctivae are normal. Pupils are equal, round, and reactive to light. Right eye exhibits no discharge. Left eye exhibits no discharge. No scleral icterus.  Neck: Normal range of motion. Neck supple. No thyromegaly present.  Cardiovascular: Normal rate, regular rhythm, normal heart sounds and intact distal pulses.  Exam reveals no gallop and no friction rub.   No murmur heard. Pulmonary/Chest: Effort normal and breath sounds normal. No stridor. No respiratory distress. She has no wheezes. She has no rales.  Abdominal: Soft. Bowel sounds are normal. She exhibits no distension. There is no tenderness. There is no rebound and no guarding.  Musculoskeletal: She exhibits tenderness. She exhibits no edema.  Left lateral ankle tenderness. Edema noted. Left anterior knee tenderness. Anterior pain with verus and valgus stress.negative anterior/posterior drawer test. No warmth or erythema. Full range of motion intact of knee and ankle.  Lymphadenopathy:    She has no cervical adenopathy.  Neurological: She is alert. Coordination normal.  Skin: Skin is warm and dry. No rash noted. She is not diaphoretic. No pallor.  Psychiatric: She has a normal mood and affect.  Nursing note and vitals reviewed.  ED Treatments / Results  DIAGNOSTIC STUDIES: Oxygen Saturation is 100% on RA, normal by my interpretation.    COORDINATION OF CARE: 10:55 AM Discussed treatment plan with pt at bedside and pt agreed to plan. I will check the x-ray of her left knee and ankle.  Labs (all labs ordered are listed, but only abnormal results are displayed) Labs Reviewed - No data to  display  EKG  EKG Interpretation None       Radiology Dg Ankle Complete Left  Result Date: 04/02/2017 CLINICAL DATA:  Left ankle pain after twisting injury 2 months ago. EXAM: LEFT ANKLE COMPLETE - 3+ VIEW COMPARISON:  None. FINDINGS: There is no evidence of fracture, dislocation, or joint effusion. There is no evidence of arthropathy or other focal bone abnormality. Mild soft tissue swelling is seen over lateral malleolus. IMPRESSION: No fracture or dislocation is noted. Mild soft tissue swelling seen over lateral malleolus suggesting ligamentous injury. Electronically Signed   By: Lupita Raider, M.D.   On: 04/02/2017 12:18   Dg Knee Complete 4 Views Left  Result Date: 04/02/2017 CLINICAL DATA:  Remote history of injury.  Fall 2 months ago. EXAM: LEFT KNEE - COMPLETE 4+ VIEW COMPARISON:  None. FINDINGS:  No evidence of fracture, dislocation, or joint effusion. No evidence of arthropathy or other focal bone abnormality. Soft tissues are unremarkable. IMPRESSION: Negative. Electronically Signed   By: Charlett NoseKevin  Dover M.D.   On: 04/02/2017 12:15    Procedures Procedures (including critical care time)  Medications Ordered in ED Medications  naproxen (NAPROSYN) tablet 500 mg (500 mg Oral Given 04/02/17 1103)     Initial Impression / Assessment and Plan / ED Course  I have reviewed the triage vital signs and the nursing notes.  Pertinent labs & imaging results that were available during my care of the patient were reviewed by me and considered in my medical decision making (see chart for details).     Patient X-Ray negative for obvious fracture or dislocation. Patient with probable ankle sprain. Home Care: Rest and elevate the injured ankle, apply ice intermittently. Use crutches without weight bearing until able to comfortable bear partial weight, then progress to full weight bearing as tolerated. Splint and Ace wrap applied to ankle and knee, respectively. See ortho for further evaluation.   Patient understands and agrees with plan. Patient vitals stable throughout ED course and discharged in satisfactory condition.  Final Clinical Impressions(s) / ED Diagnoses   Final diagnoses:  Acute pain of left knee  Acute left ankle pain    New Prescriptions Discharge Medication List as of 04/02/2017 12:57 PM    START taking these medications   Details  naproxen (NAPROSYN) 500 MG tablet Take 1 tablet (500 mg total) by mouth 2 (two) times daily., Starting Mon 04/02/2017, Print       I personally performed the services described in this documentation, which was scribed in my presence. The recorded information has been reviewed and is accurate.    Emi HolesLaw, Jacquie Lukes M, PA-C 04/02/17 1404    Raeford RazorKohut, Stephen, MD 04/03/17 2108

## 2017-04-02 NOTE — Progress Notes (Signed)
Orthopedic Tech Progress Note Patient Details:  Alexandra Gray 10/16/1991 [id number]  Ortho Devices Type of Ortho Device: Ace wrap, ASO, Crutches Ortho Device/Splint Location: lle Ortho Device/Splint Interventions: Application   Araceli Coufal 04/02/2017, 1:06 PM  

## 2017-04-02 NOTE — ED Triage Notes (Signed)
Pt reports twisting left leg/ankle in past and having pain and swelling to left knee and ankle since.

## 2017-04-02 NOTE — Progress Notes (Signed)
Orthopedic Tech Progress Note Patient Details:  Alexandra GettingCierra R Gray 04-11-1991 [id number]  Ortho Devices Type of Ortho Device: Ace wrap, ASO, Crutches Ortho Device/Splint Location: lle Ortho Device/Splint Interventions: Application   Sincere Berlanga 04/02/2017, 1:06 PM

## 2017-06-17 ENCOUNTER — Emergency Department (HOSPITAL_COMMUNITY): Payer: Medicaid Other

## 2017-06-17 ENCOUNTER — Emergency Department (HOSPITAL_COMMUNITY)
Admission: EM | Admit: 2017-06-17 | Discharge: 2017-06-17 | Disposition: A | Discharge status: Home / Self Care | Payer: Medicaid Other | Attending: Emergency Medicine | Admitting: Emergency Medicine

## 2017-06-17 ENCOUNTER — Encounter (HOSPITAL_COMMUNITY): Payer: Self-pay | Admitting: *Deleted

## 2017-06-17 DIAGNOSIS — Z79899 Other long term (current) drug therapy: Secondary | ICD-10-CM | POA: Insufficient documentation

## 2017-06-17 DIAGNOSIS — F329 Major depressive disorder, single episode, unspecified: Secondary | ICD-10-CM | POA: Diagnosis not present

## 2017-06-17 DIAGNOSIS — R519 Headache, unspecified: Secondary | ICD-10-CM

## 2017-06-17 DIAGNOSIS — I10 Essential (primary) hypertension: Secondary | ICD-10-CM | POA: Insufficient documentation

## 2017-06-17 DIAGNOSIS — R51 Headache: Secondary | ICD-10-CM | POA: Insufficient documentation

## 2017-06-17 IMAGING — CT CT HEAD W/O CM
4 series · 17 of 47 positions shown, 19 images · non-contrast
Comparison: None.

CLINICAL DATA: Headache.  Blurry vision.

EXAM:
CT HEAD WITHOUT CONTRAST
TECHNIQUE: Contiguous axial images were obtained from the base of the skull
through the vertex without intravenous contrast.

[Series 3: head without · axial · non-contrast · 0.41mm/px · z∈[-112,+8]mm · 7 of 33 slices shown, 9 images]
[im 5/33  brain]
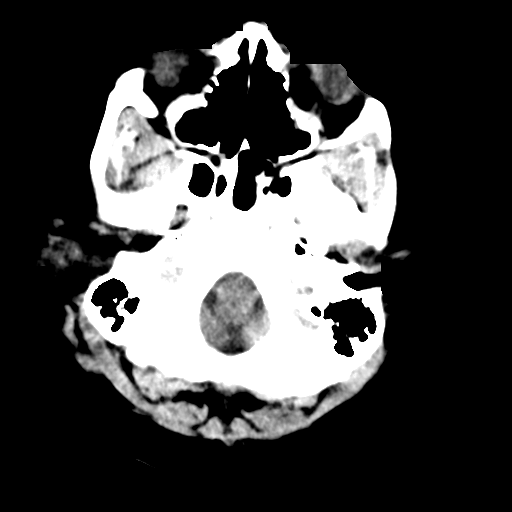
[im 5/33  bone]
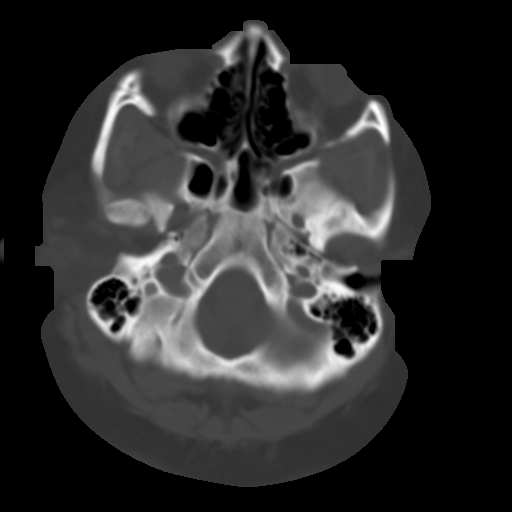
[im 9/33  brain]
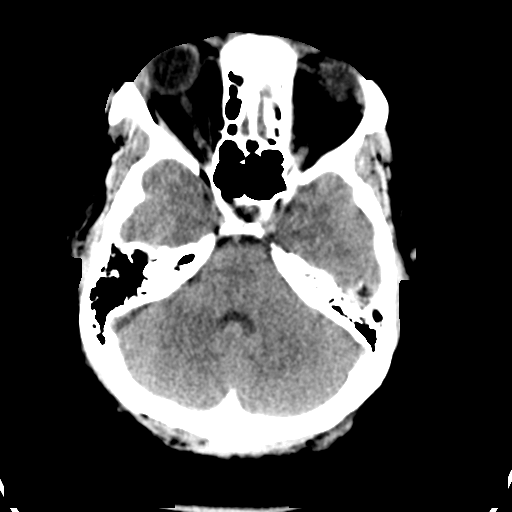
[im 13/33  brain]
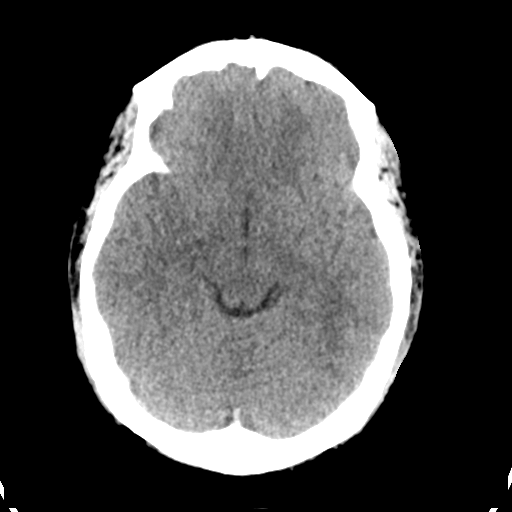
[im 17/33  brain]
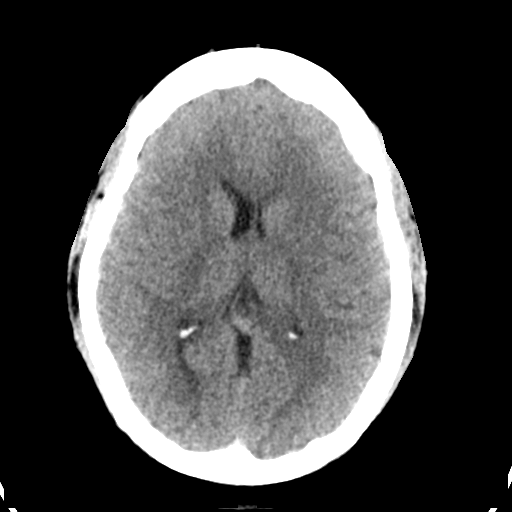
[im 21/33  brain]
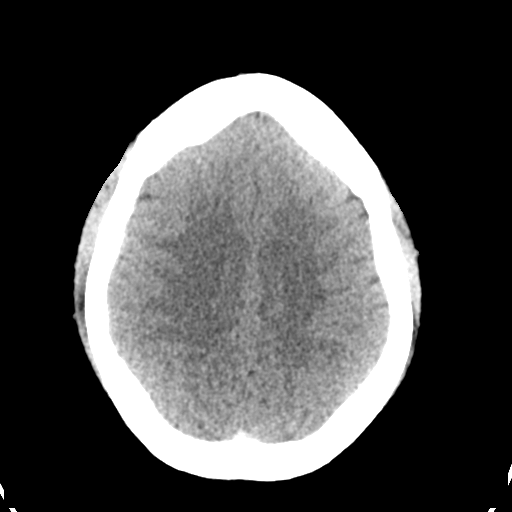
[im 21/33  bone]
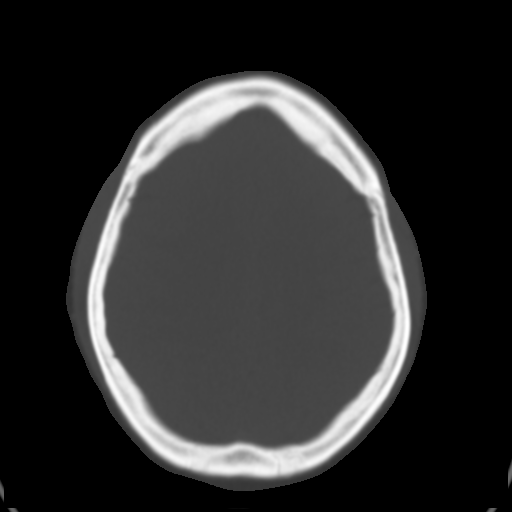
[im 25/33  brain]
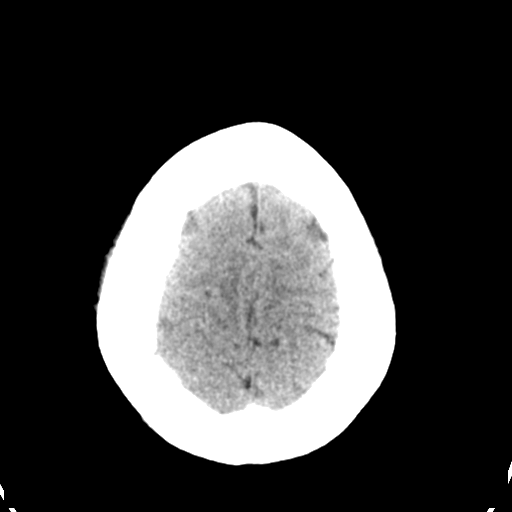
[im 29/33  brain]
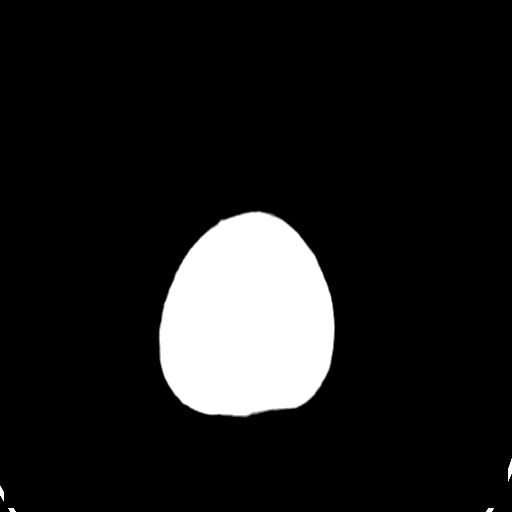

[Series 4: head bone · axial · 0.40mm/px · z∈[-117,-61]mm · 4 of 82 slices shown]
[im 9/82  bone]
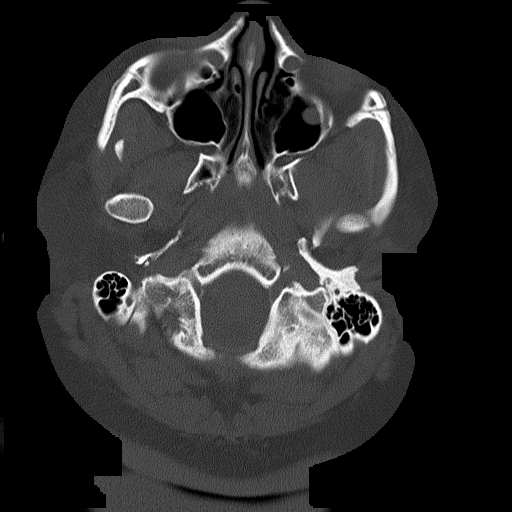
[im 17/82  bone]
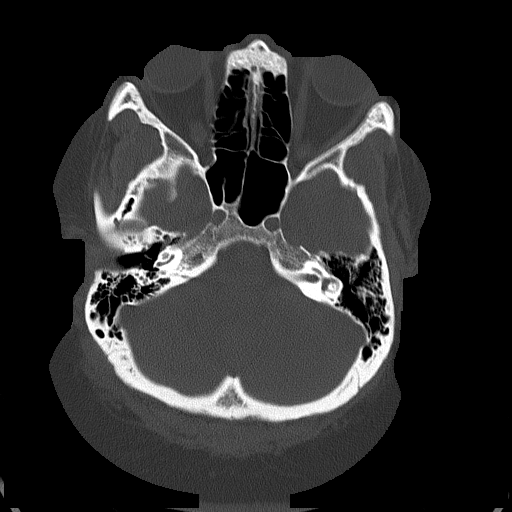
[im 25/82  bone]
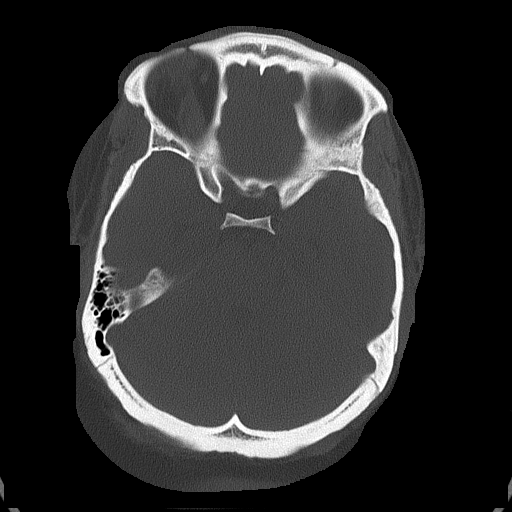
[im 37/82  bone]
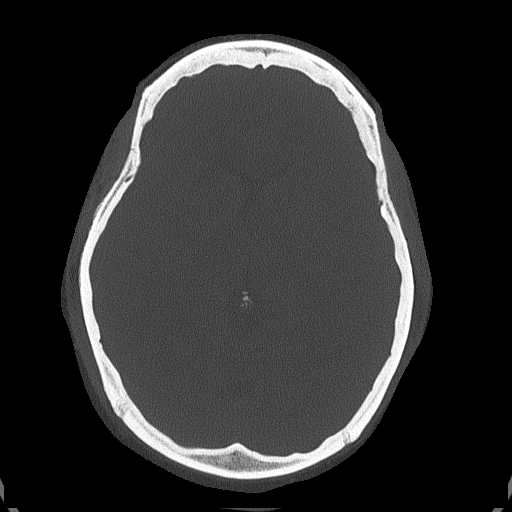

[Series 5: head without cor · coronal · non-contrast · 0.32mm/px · 3 of 67 slices shown]
[im 23/67  brain]
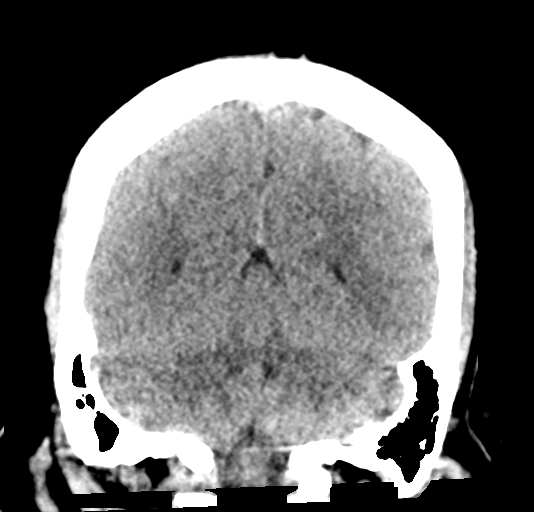
[im 30/67  brain]
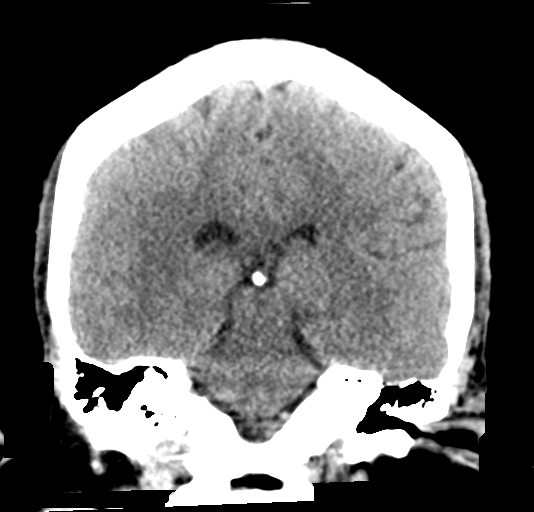
[im 37/67  brain]
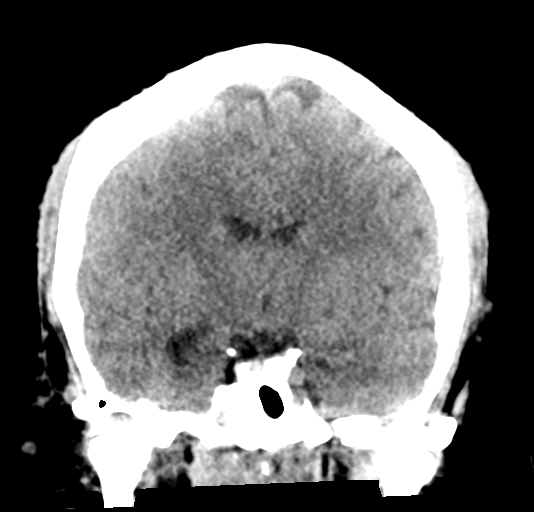

[Series 6: head without sag · sagittal · non-contrast · 0.32mm/px · 3 of 57 slices shown]
[im 19/57  brain]
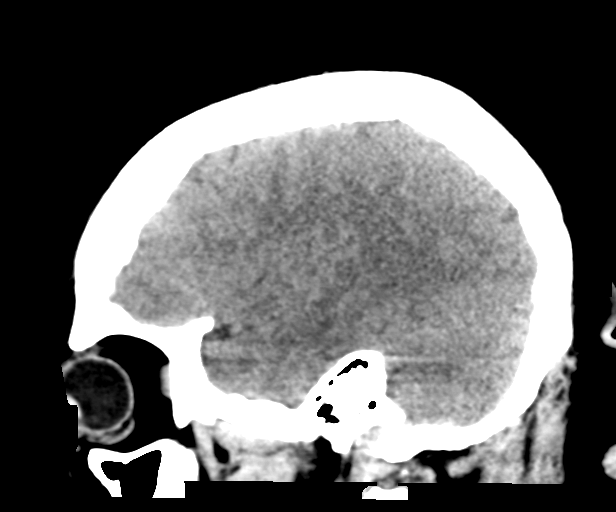
[im 29/57  brain]
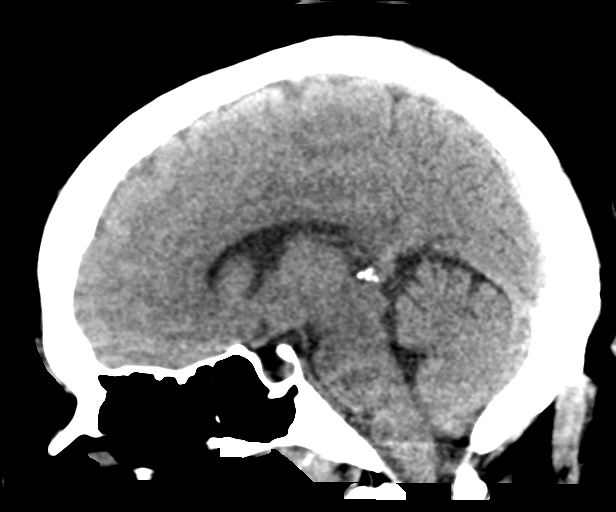
[im 38/57  brain]
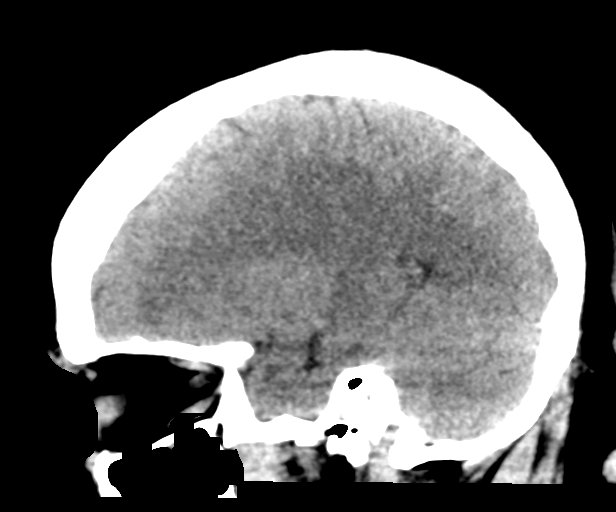

[17 of 47 positions shown; findings below may reference images not displayed]

FINDINGS: Brain: No intracranial hemorrhage, mass effect, or midline shift. No
hydrocephalus. The basilar cisterns are patent. No evidence of
territorial infarct. No extra-axial or intracranial fluid
collection.

Vascular: No hyperdense vessel or unexpected calcification.

Skull: Normal. Negative for fracture or focal lesion.

Sinuses/Orbits: Small mucous retention cysts in the maxillary
sinuses. Visualized orbits are unremarkable. Mastoid air cells are
clear.

Other: None.
IMPRESSION: No acute abnormality or explanation for headache.

## 2017-06-17 MED ORDER — DEXAMETHASONE SODIUM PHOSPHATE 10 MG/ML IJ SOLN
10.0000 mg | Freq: Once | INTRAMUSCULAR | Status: AC
  Administered 2017-06-17: 10 mg via INTRAVENOUS
  Filled 2017-06-17: qty 1

## 2017-06-17 MED ORDER — METOCLOPRAMIDE HCL 5 MG/ML IJ SOLN
10.0000 mg | Freq: Once | INTRAMUSCULAR | Status: AC
  Administered 2017-06-17: 10 mg via INTRAVENOUS
  Filled 2017-06-17: qty 2

## 2017-06-17 MED ORDER — KETOROLAC TROMETHAMINE 30 MG/ML IJ SOLN
30.0000 mg | Freq: Once | INTRAMUSCULAR | Status: AC
  Administered 2017-06-17: 30 mg via INTRAVENOUS
  Filled 2017-06-17: qty 1

## 2017-06-17 MED ORDER — DIPHENHYDRAMINE HCL 50 MG/ML IJ SOLN
25.0000 mg | Freq: Once | INTRAMUSCULAR | Status: AC
  Administered 2017-06-17: 25 mg via INTRAVENOUS
  Filled 2017-06-17: qty 1

## 2017-06-17 MED ORDER — SODIUM CHLORIDE 0.9 % IV BOLUS (SEPSIS)
1000.0000 mL | Freq: Once | INTRAVENOUS | Status: AC
  Administered 2017-06-17: 1000 mL via INTRAVENOUS

## 2017-06-17 NOTE — Discharge Instructions (Signed)
Ibuprofen 600 mg every 6 hours as needed for pain.  Follow-up with your primary Dr. if not improving in the next 3-4 days.

## 2017-06-17 NOTE — ED Notes (Signed)
Patient ambulatory to the room, no distress noted at this time.

## 2017-06-17 NOTE — ED Notes (Signed)
Patient transported to CT 

## 2017-06-17 NOTE — ED Provider Notes (Signed)
[facility] Provider Note   CSN: [id number] Arrival date & time: 06/17/17  0105  By signing my name below, I, Alexandra Gray, attest that this documentation has been prepared under the direction and in the presence of Geoffery Lyons, MD. Electronically Signed: Diona Gray, ED Scribe. 06/17/17. 1:53 AM.  History   Chief Complaint Chief Complaint  Patient presents with  . Headache    HPI Alexandra Gray is a 26 y.o. female who presents to the Emergency Department complaining of a constant headache for ~ 3 weeks. Associated sx include blurry vision. Pain radiates to her chest. She hasn't had a HA like this in the past. She has tried taking OTC pain medication without relief. No recent head injury. No hx of DM. Pt denies fever, nausea and vomiting  The history is provided by the patient. No language interpreter was used.  Headache   This is a new problem. The current episode started more than 1 week ago. The problem occurs constantly. The problem has been gradually worsening. The quality of the pain is described as throbbing. The pain is moderate. Pertinent negatives include no fever, no syncope, no nausea and no vomiting. She has tried NSAIDs and acetaminophen for the symptoms. The treatment provided no relief.    Past Medical History:  Diagnosis Date  . Chlamydia   . Depression    doing ok now  . Gonorrhea   . Hypertension    sometimes is up, never on meds  . Mental disorder   . Ovarian cyst   . PID (pelvic inflammatory disease)   . Sleep apnea     Patient Active Problem List   Diagnosis Date Noted  . History of pregnancy induced hypertension 12/06/2016  . History of trichomonal vaginitis 12/06/2016  . S/P primary low transverse C-section 10/03/2015  . 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 Suicidal ideation 08/04/2013    Past Surgical History:  Procedure Laterality Date  . CESAREAN SECTION N/A 10/03/2015   Procedure: CESAREAN SECTION;  Surgeon: Tilda Burrow, MD;  Location: [facility];  Service: Obstetrics;  Laterality: N/A;  . KNEE SURGERY      OB History    Gravida Para Term Preterm AB Living   1 1 1     1    SAB TAB Ectopic Multiple Live Births         0 1       Home Medications    Prior to Admission medications   Medication Sig Start Date End Date Taking? Authorizing Provider  ibuprofen (ADVIL,MOTRIN) 600 MG tablet Take 1 tablet (600 mg total) by mouth every 6 (six) hours as needed for mild pain. Patient not taking: Reported on 12/06/2016 10/06/15   Casey Burkitt, MD  naproxen (NAPROSYN) 500 MG tablet Take 1 tablet (500 mg total) by mouth 2 (two) times daily. 04/02/17   Law, Waylan Boga, PA-C  oxyCODONE-acetaminophen (PERCOCET/ROXICET) 5-325 MG tablet Take 1 tablet by mouth every 4 (four) hours as needed (for pain scale 4-7). Patient not taking: Reported on 12/06/2016 10/06/15   Casey Burkitt, MD  Prenatal Multivit-Min-Fe-FA (PRENATAL VITAMINS) 0.8 MG tablet Take 1 tablet by mouth daily. Patient not taking: Reported on 12/06/2016 02/12/15   Clemmons, Elmore Guise, CNM  sertraline (ZOLOFT) 50 MG tablet TAKE ONE TABLET BY MOUTH ONCE DAILY Patient not taking: Reported on 12/06/2016 05/08/16   Tereso Newcomer, MD    Family History Family History  Problem Relation Age of Onset  .  Cancer Neg Hx   . Diabetes Neg Hx   . Heart disease Neg Hx   . Hypertension Neg Hx   . Stroke Neg Hx   . Hearing loss Neg Hx     Social History Social History  Substance Use Topics  . Smoking status: Never Smoker  . Smokeless tobacco: Never Used  . Alcohol use No     Allergies   Patient has no known allergies.   Review of Systems Review of Systems  Constitutional: Negative for fever.  Eyes: Positive for visual disturbance.  Cardiovascular: Negative for syncope.  Gastrointestinal: Negative for nausea and vomiting.  Neurological: Positive for headaches.  All other systems reviewed  and are negative.    Physical Exam Updated Vital Signs BP (!) 149/106 (BP Location: Right Arm)   Pulse 86   Temp 98.3 F (36.8 C) (Oral)   Resp 20   Ht 5' (1.524 m)   Wt 246 lb (111.6 kg)   SpO2 99%   BMI 48.04 kg/m   Physical Exam  Constitutional: She is oriented to person, place, and time. She appears well-developed and well-nourished. No distress.  HENT:  Head: Normocephalic and atraumatic.  Mouth/Throat: Oropharynx is clear and moist. No oropharyngeal exudate.  Eyes: Pupils are equal, round, and reactive to light. Conjunctivae and EOM are normal.  Neck: Normal range of motion. Neck supple.  No meningismus.  Cardiovascular: Normal rate, regular rhythm, normal heart sounds and intact distal pulses.   No murmur heard. Pulmonary/Chest: Effort normal and breath sounds normal. No respiratory distress.  Abdominal: Soft. There is no tenderness. There is no rebound and no guarding.  Musculoskeletal: Normal range of motion. She exhibits no edema or tenderness.  Neurological: She is alert and oriented to person, place, and time. No cranial nerve deficit. She exhibits normal muscle tone. Coordination normal.   5/5 strength throughout. CN 2-12 intact.Equal grip strength.   Skin: Skin is warm.  Psychiatric: She has a normal mood and affect. Her behavior is normal.  Nursing note and vitals reviewed.    ED Treatments / Results  DIAGNOSTIC STUDIES: Oxygen Saturation is 99% on RA, normal by my interpretation.   COORDINATION OF CARE: 1:53 AM-Discussed next steps with pt which includes a CT scan and IV fluids. Pt verbalized understanding and is agreeable with the plan.   Labs (all labs ordered are listed, but only abnormal results are displayed) Labs Reviewed - No data to display  EKG  EKG Interpretation None       Radiology No results found.  Procedures Procedures (including critical care time)  Medications Ordered in ED Medications - No data to display   Initial  Impression / Assessment and Plan / ED Course  I have reviewed the triage vital signs and the nursing notes.  Pertinent labs & imaging results that were available during my care of the patient were reviewed by me and considered in my medical decision making (see chart for details).  Patient presents with complaints of headache. Neuro exam is nonfocal and head ct is negative. She is feeling better with ivf, migraine cocktail in the ED. Will discharge, to follow up as needed.  Final Clinical Impressions(s) / ED Diagnoses   Final diagnoses:  None    New Prescriptions New Prescriptions   No medications on file   I personally performed the services described in this documentation, which was scribed in my presence. The recorded information has been reviewed and is accurate.       Sherlyn Ebbert,  Riley Lam, MD 06/17/17 1610

## 2017-06-17 NOTE — ED Triage Notes (Signed)
The pt is c/o a headache  For 3 weeks with mouth pain  lmp last month

## 2017-06-17 NOTE — ED Notes (Signed)
Pt returned from CT °

## 2017-10-10 ENCOUNTER — Emergency Department (HOSPITAL_COMMUNITY): Payer: Medicaid Other

## 2017-10-10 ENCOUNTER — Encounter (HOSPITAL_COMMUNITY): Payer: Self-pay | Admitting: Emergency Medicine

## 2017-10-10 DIAGNOSIS — R0789 Other chest pain: Secondary | ICD-10-CM | POA: Insufficient documentation

## 2017-10-10 DIAGNOSIS — F99 Mental disorder, not otherwise specified: Secondary | ICD-10-CM | POA: Diagnosis not present

## 2017-10-10 DIAGNOSIS — G43909 Migraine, unspecified, not intractable, without status migrainosus: Secondary | ICD-10-CM | POA: Insufficient documentation

## 2017-10-10 DIAGNOSIS — F329 Major depressive disorder, single episode, unspecified: Secondary | ICD-10-CM | POA: Diagnosis not present

## 2017-10-10 DIAGNOSIS — I10 Essential (primary) hypertension: Secondary | ICD-10-CM | POA: Diagnosis present

## 2017-10-10 LAB — CBC
HEMATOCRIT: 43.5 % (ref 36.0–46.0)
HEMOGLOBIN: 13.9 g/dL (ref 12.0–15.0)
MCH: 26.5 pg (ref 26.0–34.0)
MCHC: 32 g/dL (ref 30.0–36.0)
MCV: 83 fL (ref 78.0–100.0)
PLATELETS: 317 10*3/uL (ref 150–400)
RBC: 5.24 MIL/uL — ABNORMAL HIGH (ref 3.87–5.11)
RDW: 13.9 % (ref 11.5–15.5)
WBC: 12.3 10*3/uL — AB (ref 4.0–10.5)

## 2017-10-10 LAB — I-STAT TROPONIN, ED: TROPONIN I, POC: 0 ng/mL (ref 0.00–0.08)

## 2017-10-10 LAB — BASIC METABOLIC PANEL
Anion gap: 8 (ref 5–15)
BUN: 8 mg/dL (ref 6–20)
CHLORIDE: 102 mmol/L (ref 101–111)
CO2: 27 mmol/L (ref 22–32)
Calcium: 9.2 mg/dL (ref 8.9–10.3)
Creatinine, Ser: 0.63 mg/dL (ref 0.44–1.00)
GFR calc Af Amer: >60 mL/min (ref >60)
GFR calc non Af Amer: >60 mL/min (ref >60)
GLUCOSE: 93 mg/dL (ref 65–99)
POTASSIUM: 3.8 mmol/L (ref 3.5–5.1)
Sodium: 137 mmol/L (ref 135–145)

## 2017-10-10 IMAGING — CR DG CHEST 2V
2 series · 2 of 2 positions shown · non-contrast
Comparison: [DATE]

CLINICAL DATA: Chest pain

EXAM:
CHEST  2 VIEW

[chest pa]
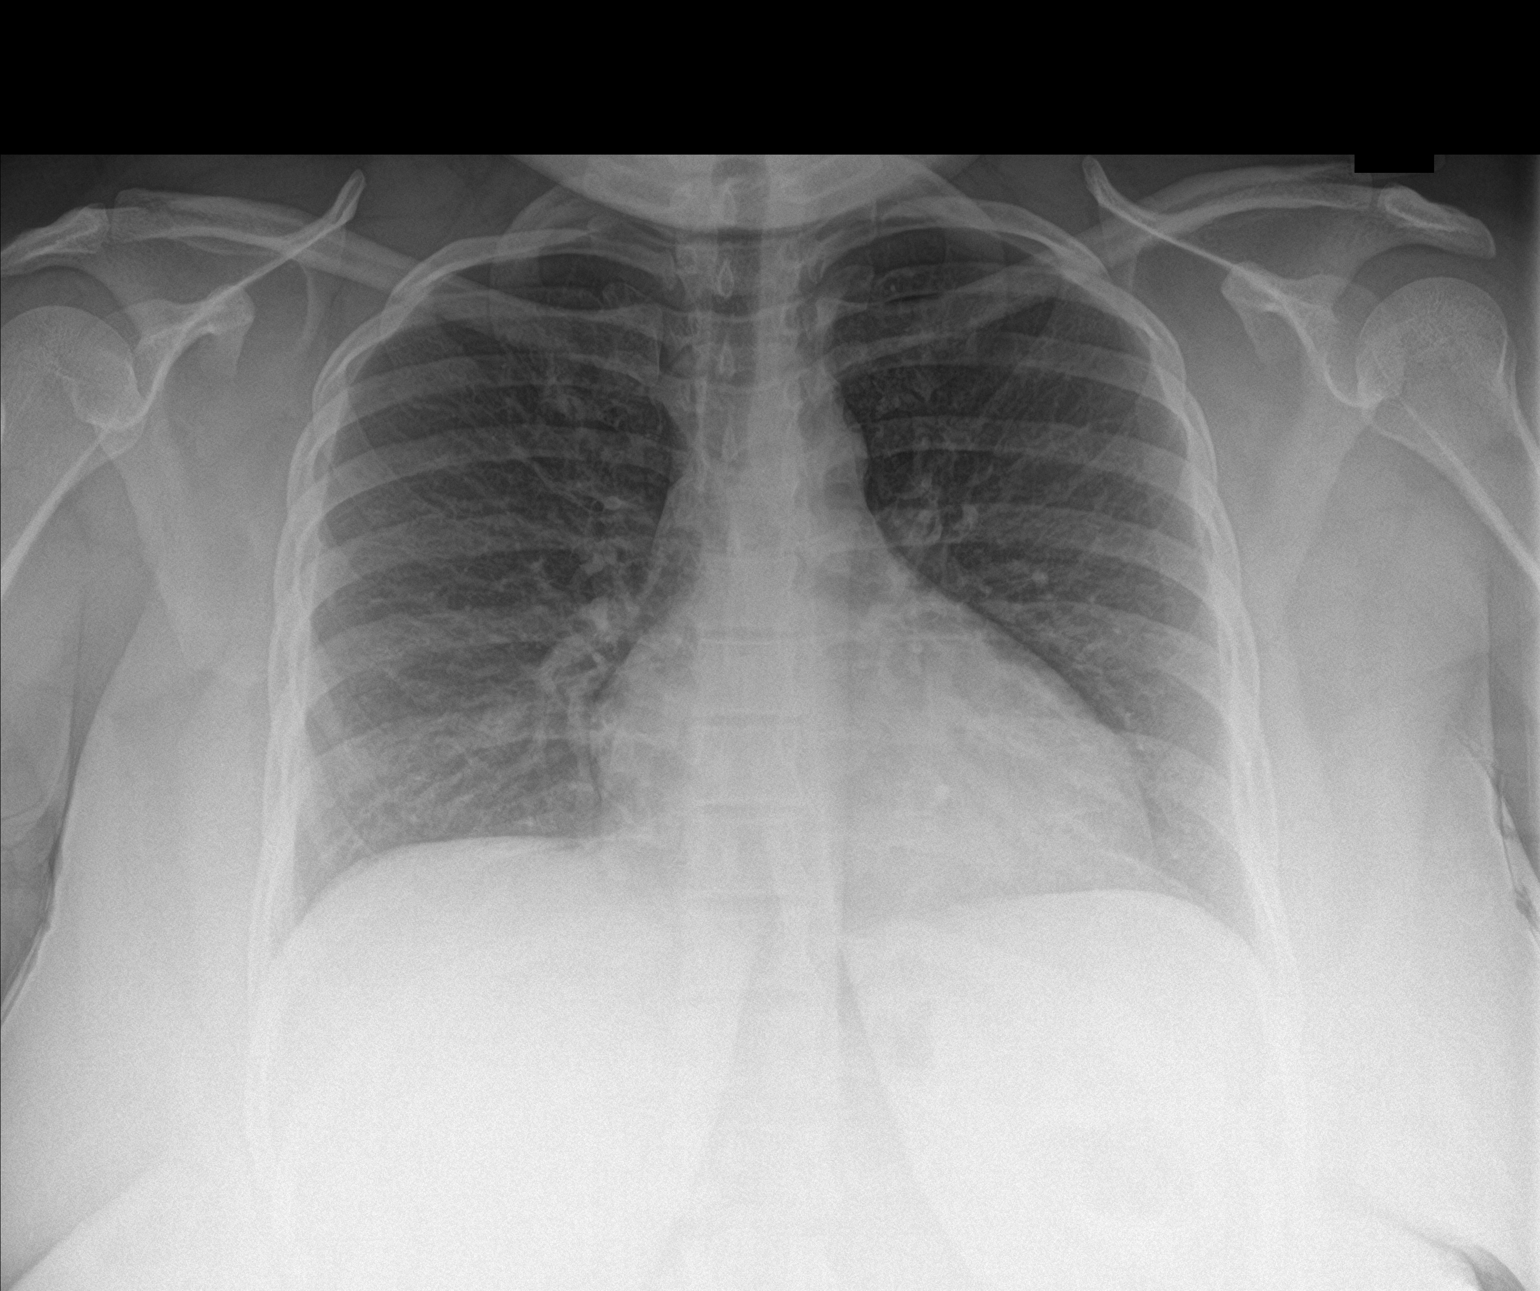

[chest lat]
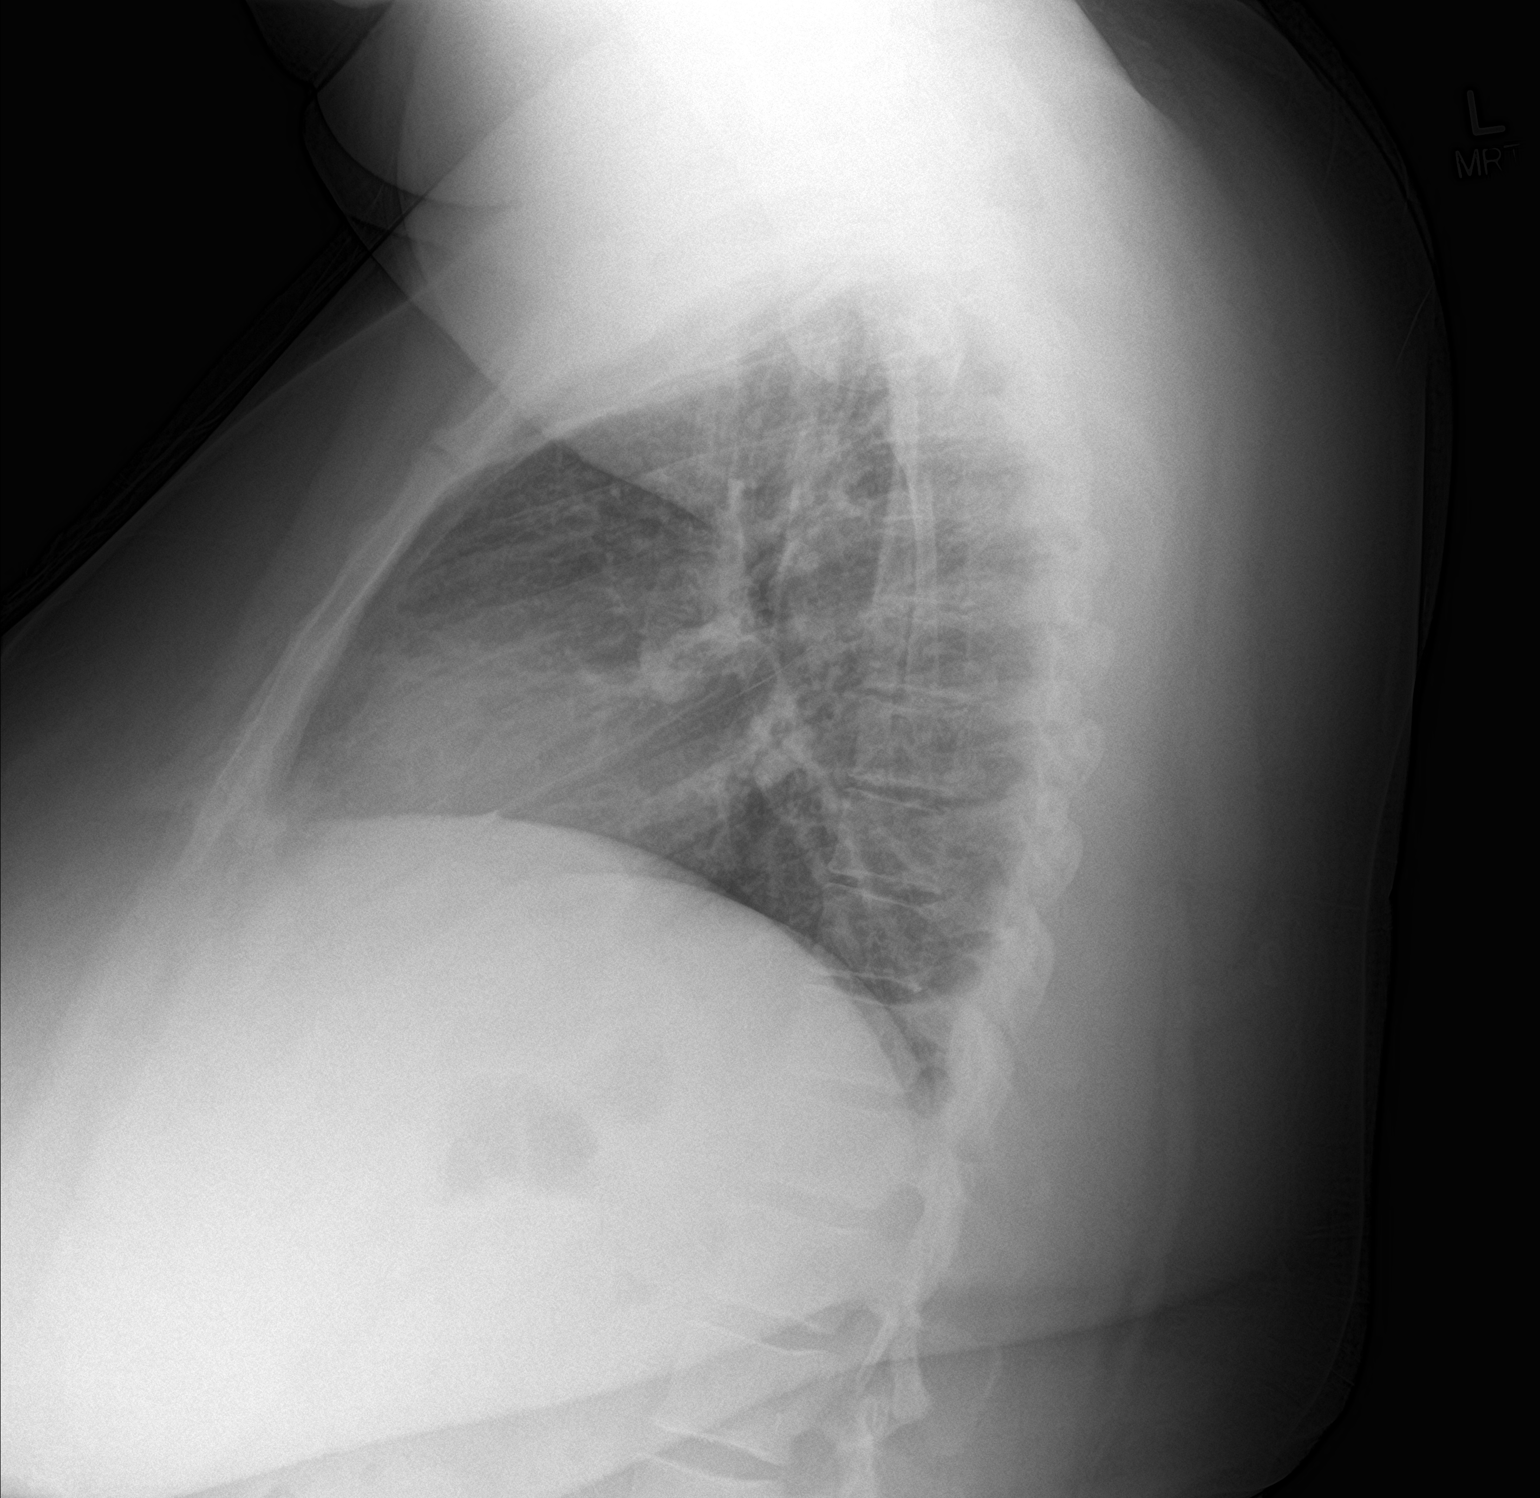

[2 of 2 positions shown; findings below may reference images not displayed]

FINDINGS: Borderline cardiomegaly. No consolidation or effusion. No
pneumothorax.
IMPRESSION: Borderline heart size.  Negative for edema or infiltrate.

## 2017-10-10 NOTE — ED Triage Notes (Signed)
Pt presents to ED from PCP for assessment of Hypertension (systolic of 140).  Pt c/o dizziness, headaches, right arm numbness (intermittent in nature), and centralized chest pain as associated symptoms.

## 2017-10-11 ENCOUNTER — Emergency Department (HOSPITAL_COMMUNITY)
Admission: EM | Admit: 2017-10-11 | Discharge: 2017-10-11 | Disposition: A | Discharge status: Home / Self Care | Payer: Medicaid Other | Attending: Emergency Medicine | Admitting: Emergency Medicine

## 2017-10-11 DIAGNOSIS — I10 Essential (primary) hypertension: Secondary | ICD-10-CM

## 2017-10-11 DIAGNOSIS — G43009 Migraine without aura, not intractable, without status migrainosus: Secondary | ICD-10-CM

## 2017-10-11 DIAGNOSIS — R0789 Other chest pain: Secondary | ICD-10-CM

## 2017-10-11 LAB — I-STAT BETA HCG BLOOD, ED (MC, WL, AP ONLY)

## 2017-10-11 MED ORDER — SODIUM CHLORIDE 0.9 % IV BOLUS (SEPSIS)
1000.0000 mL | Freq: Once | INTRAVENOUS | Status: AC
  Administered 2017-10-11: 1000 mL via INTRAVENOUS

## 2017-10-11 MED ORDER — DEXAMETHASONE SODIUM PHOSPHATE 10 MG/ML IJ SOLN
10.0000 mg | Freq: Once | INTRAMUSCULAR | Status: AC
  Administered 2017-10-11: 10 mg via INTRAVENOUS
  Filled 2017-10-11: qty 1

## 2017-10-11 MED ORDER — DIPHENHYDRAMINE HCL 50 MG/ML IJ SOLN
25.0000 mg | Freq: Once | INTRAMUSCULAR | Status: AC
  Administered 2017-10-11: 25 mg via INTRAVENOUS
  Filled 2017-10-11: qty 1

## 2017-10-11 MED ORDER — METOCLOPRAMIDE HCL 5 MG/ML IJ SOLN
10.0000 mg | Freq: Once | INTRAMUSCULAR | Status: AC
  Administered 2017-10-11: 10 mg via INTRAVENOUS
  Filled 2017-10-11: qty 2

## 2017-10-11 MED ORDER — KETOROLAC TROMETHAMINE 30 MG/ML IJ SOLN
30.0000 mg | Freq: Once | INTRAMUSCULAR | Status: AC
  Administered 2017-10-11: 30 mg via INTRAVENOUS
  Filled 2017-10-11: qty 1

## 2017-10-11 NOTE — ED Notes (Signed)
Pt had to be wakened she reports that she still has a headache

## 2017-10-11 NOTE — ED Provider Notes (Signed)
TIME SEEN: 12:49 AM  CHIEF COMPLAINT: Multiple complaints  HPI: Patient is a 26 year old female with history of hypertension not on medication, depression, morbid obesity who presents to the emergency department with multiple complaints.  She states that she went to her OB/GYN today for a normal checkup and was found to have a blood pressure of 140/102.  They have asked her to please go to the ER for evaluation.  She complains of frontal throbbing headaches intermittently for the past few weeks.  Has a documented history of migraines and states this feels similar.  Has also felt lightheaded and nauseated with these headaches but no vomiting.  States she has tried over-the-counter medications without relief.  No aggravating factors.  Also reports intermittent chest pain anteriorly throughout her chest that is a sharp pain that goes into both arms.  This is been present intermittently for 1 week.  No shortness of breath, vomiting, diaphoresis.  No fever or cough.  No numbness, tingling or focal weakness.  She states that her blood pressure has fluctuated intermittently for years.  She does have a PCP but states "he is hard to see".  She has never seen a neurologist for her headaches.  Denies that her headache was sudden onset, thunderclap, worst headache of her life.  ROS: See HPI Constitutional: no fever  Eyes: no drainage  ENT: no runny nose   Cardiovascular:  chest pain  Resp: no SOB  GI: no vomiting GU: no dysuria Integumentary: no rash  Allergy: no hives  Musculoskeletal: no leg swelling  Neurological: no slurred speech ROS otherwise negative  PAST MEDICAL HISTORY/PAST SURGICAL HISTORY:  Past Medical History:  Diagnosis Date  . Chlamydia   . Depression    doing ok now  . Gonorrhea   . Hypertension    sometimes is up, never on meds  . Mental disorder   . Ovarian cyst   . PID (pelvic inflammatory disease)   . Sleep apnea     MEDICATIONS:  Prior to Admission medications    Medication Sig Start Date End Date Taking? Authorizing Provider  ibuprofen (ADVIL,MOTRIN) 600 MG tablet Take 1 tablet (600 mg total) by mouth every 6 (six) hours as needed for mild pain. Patient not taking: Reported on 10/11/2017 10/06/15   Casey BurkittFitzgerald, Hillary Moen, MD  naproxen (NAPROSYN) 500 MG tablet Take 1 tablet (500 mg total) by mouth 2 (two) times daily. Patient not taking: Reported on 10/11/2017 04/02/17   Emi HolesLaw, Alexandra M, PA-C  oxyCODONE-acetaminophen (PERCOCET/ROXICET) 5-325 MG tablet Take 1 tablet by mouth every 4 (four) hours as needed (for pain scale 4-7). Patient not taking: Reported on 10/11/2017 10/06/15   Casey BurkittFitzgerald, Hillary Moen, MD  Prenatal Multivit-Min-Fe-FA (PRENATAL VITAMINS) 0.8 MG tablet Take 1 tablet by mouth daily. Patient not taking: Reported on 10/11/2017 02/12/15   Clemmons, Lawson FiscalLori A, CNM  sertraline (ZOLOFT) 50 MG tablet TAKE ONE TABLET BY MOUTH ONCE DAILY Patient not taking: Reported on 10/11/2017 05/08/16   Anyanwu, Jethro BastosUgonna A, MD    ALLERGIES:  No Known Allergies  SOCIAL HISTORY:  Social History   Tobacco Use  . Smoking status: Never Smoker  . Smokeless tobacco: Never Used  Substance Use Topics  . Alcohol use: No    FAMILY HISTORY: Family History  Problem Relation Age of Onset  . Cancer Neg Hx   . Diabetes Neg Hx   . Heart disease Neg Hx   . Hypertension Neg Hx   . Stroke Neg Hx   . Hearing loss Neg  Hx     EXAM: BP 128/80   Pulse 88   Temp 98.4 F (36.9 C) (Oral)   Resp 20   Ht 5' (1.524 m)   Wt 122.5 kg (270 lb)   LMP 09/25/2017   SpO2 98%   BMI 52.73 kg/m  CONSTITUTIONAL: Alert and oriented and responds appropriately to questions. Well-appearing; well-nourished, morbidly obese, afebrile HEAD: Normocephalic, atraumatic EYES: Conjunctivae clear, pupils appear equal, EOMI ENT: normal nose; moist mucous membranes NECK: Supple, no meningismus, no nuchal rigidity, no LAD  CARD: RRR; S1 and S2 appreciated; no murmurs, no clicks, no rubs, no  gallops CHEST:  Chest wall is tender to palpation over the center of the sternum which reproduces her pain.  No crepitus, ecchymosis, erythema, warmth, rash or other lesions present.   RESP: Normal chest excursion without splinting or tachypnea; breath sounds clear and equal bilaterally; no wheezes, no rhonchi, no rales, no hypoxia or respiratory distress, speaking full sentences ABD/GI: Normal bowel sounds; non-distended; soft, non-tender, no rebound, no guarding, no peritoneal signs, no hepatosplenomegaly BACK:  The back appears normal and is non-tender to palpation, there is no CVA tenderness EXT: Normal ROM in all joints; non-tender to palpation; no edema; normal capillary refill; no cyanosis, no calf tenderness or swelling    SKIN: Normal color for age and race; warm; no rash NEURO: Moves all extremities equally; Strength 5/5 in all four extremities.  Normal sensation diffusely.  CN 2-12 grossly intact.  No dysmetria to finger to nose testing bilaterally.  Normal speech.   PSYCH: The patient's mood and manner are appropriate. Grooming and personal hygiene are appropriate.  MEDICAL DECISION MAKING: Patient here with complaints of chest pain.  Seems very atypical and she has reproducible tenderness on exam.  I suspect chest wall pain.  Doubt PE, dissection or ACS.  Troponin ordered in triage is negative.  Chest x-ray clear.  Also complaining of headache.  Doubt intracranial hemorrhage, stroke, meningitis, mass, cavernous sinus thrombosis.  Has history of similar headaches.  Neurologically intact here.  Will treat with migraine cocktail.  Currently her blood pressure is 128/80.  I do not think this is the cause of her headache or chest pain.  I do not feel it is safe to start her on blood pressure medication from the ER given she is normotensive at this time.  ED PROGRESS: Patient's headache has resolved.  Chest pain also almost gone.  She is not pregnant.  Able to ambulate without difficulty.  Will  give her outpatient neurology follow-up for her chronic headaches.  She has a PCP.  Blood pressure is still normal.  She is still neurologically intact.   At this time, I do not feel there is any life-threatening condition present. I have reviewed and discussed all results (EKG, imaging, lab, urine as appropriate) and exam findings with patient/family. I have reviewed nursing notes and appropriate previous records.  I feel the patient is safe to be discharged home without further emergent workup and can continue workup as an outpatient as needed. Discussed usual and customary return precautions. Patient/family verbalize understanding and are comfortable with this plan.  Outpatient follow-up has been provided if needed. All questions have been answered.    EKG Interpretation  Date/Time:  Wednesday October 10 2017 18:19:29 EST Ventricular Rate:  99 PR Interval:  156 QRS Duration: 94 QT Interval:  372 QTC Calculation: 477 R Axis:   94 Text Interpretation:  Normal sinus rhythm Rightward axis Borderline ECG No significant change since  last tracing Confirmed by Isael Stille, Baxter HireKristen [phone]([id number]) on 10/11/2017 12:21:54 AM         My Madariaga, Layla MawKristen N, DO 10/11/17 [id number]

## 2017-10-11 NOTE — Discharge Instructions (Signed)
You may alternate Tylenol 1000 mg every 6 hours as needed for pain and Ibuprofen 800 mg every 8 hours as needed for pain.  Please take Ibuprofen with food. ° °

## 2017-10-11 NOTE — ED Notes (Signed)
The pt saw her ob doctor earlier today.  Her bp was high and she was to, come to the ed.  She has never been on bp med.  Headache for one week no nv or diarrhea

## 2017-10-11 NOTE — ED Notes (Signed)
Patient verbalized understanding of discharge instructions and denies any further needs or questions at this time. VS stable. Patient ambulatory with steady gait.  

## 2017-10-12 ENCOUNTER — Ambulatory Visit (INDEPENDENT_AMBULATORY_CARE_PROVIDER_SITE_OTHER): Payer: Medicaid Other | Admitting: Neurology

## 2017-10-12 ENCOUNTER — Encounter: Payer: Self-pay | Admitting: Neurology

## 2017-10-12 VITALS — BP 130/80 | HR 77 | Ht 60.0 in | Wt 295.1 lb

## 2017-10-12 DIAGNOSIS — G43019 Migraine without aura, intractable, without status migrainosus: Secondary | ICD-10-CM

## 2017-10-12 DIAGNOSIS — R202 Paresthesia of skin: Secondary | ICD-10-CM

## 2017-10-12 DIAGNOSIS — H539 Unspecified visual disturbance: Secondary | ICD-10-CM

## 2017-10-12 DIAGNOSIS — M6281 Muscle weakness (generalized): Secondary | ICD-10-CM | POA: Diagnosis not present

## 2017-10-12 MED ORDER — TOPIRAMATE 25 MG PO TABS: 25.0000 mg | ORAL_TABLET | Freq: Every day | ORAL | 5 refills | Status: DC
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
## 2017-10-12 NOTE — Patient Instructions (Signed)
1.  MRI brain wwo contrast 2.  Start topiramate 25mg  daily  3.  Limit tylenol to twice per week 4.  Follow-up with your psychiatrist and restart your depression medications  Return to clinic in 3 months

## 2017-11-02 ENCOUNTER — Encounter: Payer: Self-pay | Admitting: Advanced Practice Midwife

## 2017-11-09 ENCOUNTER — Emergency Department (HOSPITAL_COMMUNITY)
Admission: EM | Admit: 2017-11-09 | Discharge: 2017-11-10 | Disposition: A | Discharge status: Home / Self Care | Payer: Medicaid Other | Attending: Emergency Medicine | Admitting: Emergency Medicine

## 2017-11-09 ENCOUNTER — Encounter (HOSPITAL_COMMUNITY): Payer: Self-pay

## 2017-11-09 ENCOUNTER — Other Ambulatory Visit: Payer: Self-pay

## 2017-11-09 ENCOUNTER — Emergency Department (HOSPITAL_COMMUNITY): Payer: Medicaid Other

## 2017-11-09 DIAGNOSIS — N83202 Unspecified ovarian cyst, left side: Secondary | ICD-10-CM | POA: Diagnosis not present

## 2017-11-09 DIAGNOSIS — N739 Female pelvic inflammatory disease, unspecified: Secondary | ICD-10-CM | POA: Diagnosis not present

## 2017-11-09 DIAGNOSIS — I10 Essential (primary) hypertension: Secondary | ICD-10-CM | POA: Diagnosis not present

## 2017-11-09 DIAGNOSIS — N730 Acute parametritis and pelvic cellulitis: Secondary | ICD-10-CM

## 2017-11-09 DIAGNOSIS — R102 Pelvic and perineal pain: Secondary | ICD-10-CM | POA: Insufficient documentation

## 2017-11-09 DIAGNOSIS — R1030 Lower abdominal pain, unspecified: Secondary | ICD-10-CM | POA: Diagnosis present

## 2017-11-09 LAB — I-STAT BETA HCG BLOOD, ED (MC, WL, AP ONLY): I-stat hCG, quantitative: <5 m[IU]/mL (ref <5)

## 2017-11-09 LAB — URINALYSIS, ROUTINE W REFLEX MICROSCOPIC
Bilirubin Urine: NEGATIVE
Glucose, UA: NEGATIVE mg/dL
Hgb urine dipstick: NEGATIVE
Ketones, ur: NEGATIVE mg/dL
Nitrite: NEGATIVE
Protein, ur: NEGATIVE mg/dL
Specific Gravity, Urine: 1.017 (ref 1.005–1.030)
pH: 6 (ref 5.0–8.0)

## 2017-11-09 LAB — COMPREHENSIVE METABOLIC PANEL
ALT: 16 U/L (ref 14–54)
AST: 23 U/L (ref 15–41)
Albumin: 3.7 g/dL (ref 3.5–5.0)
Alkaline Phosphatase: 84 U/L (ref 38–126)
Anion gap: 8 (ref 5–15)
BUN: 11 mg/dL (ref 6–20)
CO2: 24 mmol/L (ref 22–32)
Calcium: 9.2 mg/dL (ref 8.9–10.3)
Chloride: 104 mmol/L (ref 101–111)
Creatinine, Ser: 0.67 mg/dL (ref 0.44–1.00)
GFR calc Af Amer: >60 mL/min (ref >60)
GFR calc non Af Amer: >60 mL/min (ref >60)
Glucose, Bld: 98 mg/dL (ref 65–99)
Potassium: 4 mmol/L (ref 3.5–5.1)
Sodium: 136 mmol/L (ref 135–145)
Total Bilirubin: 0.7 mg/dL (ref 0.3–1.2)
Total Protein: 6.8 g/dL (ref 6.5–8.1)

## 2017-11-09 LAB — CBC
HCT: 42.3 % (ref 36.0–46.0)
Hemoglobin: 13.6 g/dL (ref 12.0–15.0)
MCH: 26.8 pg (ref 26.0–34.0)
MCHC: 32.2 g/dL (ref 30.0–36.0)
MCV: 83.3 fL (ref 78.0–100.0)
Platelets: 287 10*3/uL (ref 150–400)
RBC: 5.08 MIL/uL (ref 3.87–5.11)
RDW: 14.1 % (ref 11.5–15.5)
WBC: 10.9 10*3/uL — ABNORMAL HIGH (ref 4.0–10.5)

## 2017-11-09 LAB — LIPASE, BLOOD: Lipase: 21 U/L (ref 11–51)

## 2017-11-09 LAB — RAPID HIV SCREEN (HIV 1/2 AB+AG)
HIV 1/2 Antibodies: NONREACTIVE
HIV-1 P24 ANTIGEN - HIV24: NONREACTIVE

## 2017-11-09 LAB — WET PREP, GENITAL
SPERM: NONE SEEN
YEAST WET PREP: NONE SEEN

## 2017-11-09 MED ORDER — CEFTRIAXONE SODIUM 250 MG IJ SOLR
250.0000 mg | Freq: Once | INTRAMUSCULAR | Status: AC
  Administered 2017-11-09: 250 mg via INTRAMUSCULAR
  Filled 2017-11-09: qty 250

## 2017-11-09 MED ORDER — ONDANSETRON HCL 4 MG PO TABS: 4.0000 mg | ORAL_TABLET | Freq: Three times a day (TID) | ORAL | 0 refills | Status: DC | PRN

## 2017-11-09 MED ORDER — METRONIDAZOLE 500 MG PO TABS
2000.0000 mg | ORAL_TABLET | Freq: Once | ORAL | Status: AC
  Administered 2017-11-09: 2000 mg via ORAL
  Filled 2017-11-09: qty 4

## 2017-11-09 MED ORDER — DOXYCYCLINE HYCLATE 100 MG PO TABS
100.0000 mg | ORAL_TABLET | Freq: Once | ORAL | Status: AC
  Administered 2017-11-09: 100 mg via ORAL
  Filled 2017-11-09: qty 1

## 2017-11-09 MED ORDER — NAPROXEN 375 MG PO TABS: 375.0000 mg | ORAL_TABLET | Freq: Two times a day (BID) | ORAL | 0 refills | Status: DC

## 2017-11-09 MED ORDER — DOXYCYCLINE HYCLATE 100 MG PO CAPS: 100.0000 mg | ORAL_CAPSULE | Freq: Two times a day (BID) | ORAL | 0 refills | Status: DC

## 2017-11-09 MED ORDER — ONDANSETRON 4 MG PO TBDP
4.0000 mg | ORAL_TABLET | Freq: Once | ORAL | Status: AC
  Administered 2017-11-09: 4 mg via ORAL
  Filled 2017-11-09: qty 1

## 2017-11-09 MED ORDER — LIDOCAINE HCL (PF) 1 % IJ SOLN
INTRAMUSCULAR | Status: AC
  Administered 2017-11-09: 0.9 mL
  Filled 2017-11-09: qty 5

## 2017-11-09 NOTE — ED Notes (Signed)
Attempted blood draw for labs, without success. Seeking assistance.

## 2017-11-09 NOTE — ED Notes (Signed)
Patient transported to Ultrasound 

## 2017-11-09 NOTE — ED Triage Notes (Signed)
Per Pt, Pt is coming from home with complaints of lower abdominal cramping that started two weeks ago. Reports some light pink vaginal discharge. Denies any urinary symptoms, V/D. Reports some nausea.

## 2017-11-10 LAB — HIV ANTIBODY (ROUTINE TESTING W REFLEX): HIV SCREEN 4TH GENERATION: NONREACTIVE

## 2017-11-10 MED ORDER — KETOROLAC TROMETHAMINE 30 MG/ML IJ SOLN
15.0000 mg | Freq: Once | INTRAMUSCULAR | Status: AC
  Administered 2017-11-10: 15 mg via INTRAMUSCULAR
  Filled 2017-11-10: qty 1

## 2017-11-10 NOTE — ED Notes (Signed)
Pt departed in NAD, refused use of wheelchair.  

## 2017-11-10 NOTE — Discharge Instructions (Addendum)
You do have signs of pelvic inflammatory disease which is caused by an sexually transmitted disease. You have been treated with antibiotics for trichomonas, gonorrhea and chlamydia.  Your gonorrhea and Chlamydia cultures are pending will be notified if they are positive.  May also follow-up along with you HIV and syphilis testing.  Please take antibiotics as prescribed to complete the course.  Please take the Naproxen as prescribed for pain. Do not take any additional NSAIDs including Motrin, Aleve, Ibuprofen, Advil.  Have been given a dose in the ED do not take any additional this evening.  Make sure you follow-up with an OB/GYN doctor and your primary care doctor.  Return to the ED with any worsening symptoms.

## 2017-11-10 NOTE — ED Provider Notes (Signed)
[facility] EMERGENCY DEPARTMENT Provider Note   CSN: [id number] Arrival date & time: 11/09/17  1345     History   Chief Complaint Chief Complaint  Patient presents with  . Abdominal Cramping    HPI Alexandra Gray is a 26 y.o. female.  HPI A 26 year old African-American female past medical history significant for ovarian cyst presents to the emergency department today with complaints of lower abdominal pain.  Patient describes the pain as cramping in nature.  She rates the pain a 7 out of 10.  Reports the pain is intermittent.  Has been ongoing for the past 2 weeks.  Patient notes some light pink vaginal discharge.  She denies any vaginal bleeding or passing of blood clots.  Patient reports some nausea but denies any emesis.  Denies any associated diarrhea or blood in her stool.  Patient denies any associated urinary symptoms.  She has not taken anything for her pain prior to arrival.  States nothing makes her symptoms better or worse.  Patient reports history of ovarian cyst and they states this feels very similar.  Patient also states that she is sexually active with one partner but does not use protection.  She has low concern for an STD.  States that she is not on her menstrual cycle.  Pt denies any fever, chill, ha, vision changes, lightheadedness, dizziness, congestion, neck pain, cp, sob, cough,  v/d, urinary symptoms, change in bowel habits, melena, hematochezia, lower extremity paresthesias.  Past Medical History:  Diagnosis Date  . Chlamydia   . Depression    doing ok now  . Gonorrhea   . Hypertension    sometimes is up, never on meds  . Mental disorder   . Ovarian cyst   . PID (pelvic inflammatory disease)   . Sleep apnea     Patient Active Problem List   Diagnosis Date Noted  . History of pregnancy induced hypertension 12/06/2016  . History of trichomonal vaginitis 12/06/2016  . S/P primary low transverse C-section 10/03/2015  . MDD (major  depressive disorder), recurrent severe, 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  . Suicidal ideation 08/04/2013    Past Surgical History:  Procedure Laterality Date  . CESAREAN SECTION N/A 10/03/2015   Procedure: CESAREAN SECTION;  Surgeon: Tilda Burrow, MD;  Location: [facility];  Service: Obstetrics;  Laterality: N/A;  . KNEE SURGERY      OB History    Gravida Para Term Preterm AB Living   1 1 1     1    SAB TAB Ectopic Multiple Live Births         0 1       Home Medications    Prior to Admission medications   Medication Sig Start Date End Date Taking? Authorizing Provider  doxycycline (VIBRAMYCIN) 100 MG capsule Take 1 capsule (100 mg total) by mouth 2 (two) times daily. 11/09/17   Rise Mu, PA-C  ibuprofen (ADVIL,MOTRIN) 600 MG tablet Take 1 tablet (600 mg total) by mouth every 6 (six) hours as needed for mild pain. Patient not taking: Reported on 10/11/2017 10/06/15   Casey Burkitt, MD  naproxen (NAPROSYN) 375 MG tablet Take 1 tablet (375 mg total) by mouth 2 (two) times daily. 11/09/17   Rise Mu, PA-C  ondansetron (ZOFRAN) 4 MG tablet Take 1 tablet (4 mg total) by mouth every 8 (eight) hours as needed for nausea or vomiting. 11/09/17   Rise Mu, PA-C  oxyCODONE-acetaminophen (PERCOCET/ROXICET) 5-325 MG tablet Take  1 tablet by mouth every 4 (four) hours as needed (for pain scale 4-7). Patient not taking: Reported on 10/11/2017 10/06/15   Casey BurkittFitzgerald, Hillary Moen, MD  Prenatal Multivit-Min-Fe-FA (PRENATAL VITAMINS) 0.8 MG tablet Take 1 tablet by mouth daily. Patient not taking: Reported on 10/11/2017 02/12/15   Clemmons, Lawson FiscalLori A, CNM  sertraline (ZOLOFT) 50 MG tablet TAKE ONE TABLET BY MOUTH ONCE DAILY Patient not taking: Reported on 10/11/2017 05/08/16   Tereso NewcomerAnyanwu, Ugonna A, MD  topiramate (TOPAMAX) 25 MG tablet Take 1 tablet (25 mg total) daily by mouth. 10/12/17   Glendale ChardPatel, Donika K, DO    Family History Family History  Problem Relation Age of  Onset  . Healthy Mother   . Healthy Father   . Healthy Brother   . Cancer Neg Hx   . Diabetes Neg Hx   . Heart disease Neg Hx   . Hypertension Neg Hx   . Stroke Neg Hx   . Hearing loss Neg Hx     Social History Social History   Tobacco Use  . Smoking status: Never Smoker  . Smokeless tobacco: Never Used  Substance Use Topics  . Alcohol use: No  . Drug use: No     Allergies   Patient has no known allergies.   Review of Systems Review of Systems  Constitutional: Negative for chills and fever.  HENT: Negative for congestion.   Eyes: Negative for visual disturbance.  Respiratory: Negative for cough and shortness of breath.   Cardiovascular: Negative for chest pain.  Gastrointestinal: Positive for abdominal pain and nausea. Negative for diarrhea and vomiting.  Genitourinary: Positive for pelvic pain and vaginal discharge. Negative for dysuria, flank pain, frequency, hematuria, urgency and vaginal bleeding.  Musculoskeletal: Negative for arthralgias and myalgias.  Skin: Negative for rash.  Neurological: Negative for dizziness, syncope, weakness, light-headedness, numbness and headaches.  Psychiatric/Behavioral: Negative for sleep disturbance. The patient is not nervous/anxious.      Physical Exam Updated Vital Signs BP 127/84   Pulse 84   Temp 98.4 F (36.9 C) (Oral)   Resp 16   Ht 5' (1.524 m)   Wt 122.5 kg (270 lb)   LMP 10/16/2017   SpO2 100%   BMI 52.73 kg/m   Physical Exam  Constitutional: She is oriented to person, place, and time. She appears well-developed and well-nourished.  Non-toxic appearance. No distress.  HENT:  Head: Normocephalic and atraumatic.  Nose: Nose normal.  Mouth/Throat: Oropharynx is clear and moist.  Eyes: Conjunctivae are normal. Pupils are equal, round, and reactive to light. Right eye exhibits no discharge. Left eye exhibits no discharge.  Neck: Normal range of motion. Neck supple.  Cardiovascular: Normal rate, regular rhythm,  normal heart sounds and intact distal pulses.  Pulmonary/Chest: Effort normal and breath sounds normal. No respiratory distress. She exhibits no tenderness.  Abdominal: Soft. Bowel sounds are normal. There is tenderness in the suprapubic area and left lower quadrant. There is no rigidity, no rebound, no guarding, no CVA tenderness, no tenderness at McBurney's point and negative Murphy's sign.  Genitourinary:  Genitourinary Comments: Chaperone present for exam. No external lesions, swelling, erythema, or rash of the labia. No erythema, bleeding, or lesions noted in the vaginal vault.  She does have some green yellow discharge noted in the vaginal vault.  CMT tenderness is present.  No, bleeding or friability. No adnexal tenderness, mass or fullness bilaterally. No inguinal adenopathy or hernia.    Musculoskeletal: Normal range of motion. She exhibits no tenderness.  Lymphadenopathy:  She has no cervical adenopathy.  Neurological: She is alert and oriented to person, place, and time.  Skin: Skin is warm and dry. Capillary refill takes less than 2 seconds.  Psychiatric: Her behavior is normal. Judgment and thought content normal.  Nursing note and vitals reviewed.    ED Treatments / Results  Labs (all labs ordered are listed, but only abnormal results are displayed) Labs Reviewed  WET PREP, GENITAL - Abnormal; Notable for the following components:      Result Value   Trich, Wet Prep PRESENT (*)    Clue Cells Wet Prep HPF POC PRESENT (*)    WBC, Wet Prep HPF POC MANY (*)    All other components within normal limits  CBC - Abnormal; Notable for the following components:   WBC 10.9 (*)    All other components within normal limits  URINALYSIS, ROUTINE W REFLEX MICROSCOPIC - Abnormal; Notable for the following components:   APPearance HAZY (*)    Leukocytes, UA LARGE (*)    Bacteria, UA RARE (*)    Squamous Epithelial / LPF 6-30 (*)    All other components within normal limits  LIPASE,  BLOOD  COMPREHENSIVE METABOLIC PANEL  RAPID HIV SCREEN (HIV 1/2 AB+AG)  HIV ANTIBODY (ROUTINE TESTING)  I-STAT BETA HCG BLOOD, ED ([facility], [facility], [facility] ONLY)  GC/CHLAMYDIA PROBE AMP ([place]) NOT AT [facility]    EKG  EKG Interpretation None       Radiology US Transvaginal Non-ob  Result Date: 11/09/2017 CLINICAL DATA:  Initial evaluation for acute pelvic pain for 2 weeks. EXAM: TRANSABDOMINAL AND TRANSVAGINAL ULTRASOUND OF PELVIS DOPPLER ULTRASOUND OF OVARIES TECHNIQUE: Both transabdominal and transvaginal ultrasound examinations of the pelvis were performed. Transabdominal technique was performed for global imaging of the pelvis including uterus, ovaries, adnexal regions, and pelvic cul-de-sac. It was necessary to proceed with endovaginal exam following the transabdominal exam to visualize the uterus and ovaries. Color and duplex Doppler ultrasound was utilized to evaluate blood flow to the ovaries. COMPARISON:  None. FINDINGS: Uterus Measurements: 5.9 x 3.0 x 3.9 cm. No fibroids or other mass visualized. Endometrium Thickness: 11.6 mm.  No focal abnormality visualized. Right ovary Measurements: 4.6 x 2.7 x 3.1 cm. Normal appearance/no adnexal mass. Left ovary Measurements: 3.6 x 2.8 x 3.7 cm. 2.4 x 1.7 x 2.4 cm complex cystic lesion with peripherally increased vascularity, most consistent with a degenerating corpus luteal cyst. Pulsed Doppler evaluation of both ovaries demonstrates normal low-resistance arterial and venous waveforms. Other findings Small volume free fluid within the pelvis, most likely physiologic. IMPRESSION: 1. 2.4 cm degenerating left ovarian corpus luteal cyst with small volume free physiologic fluid within the pelvis. 2. Otherwise unremarkable pelvic ultrasound. No evidence for torsion. Electronically Signed   By: Rise Mu M.D.   On: 11/09/2017 22:48   US Pelvis Complete  Result Date: 11/09/2017 CLINICAL DATA:  Initial evaluation for acute pelvic pain for 2 weeks.  EXAM: TRANSABDOMINAL AND TRANSVAGINAL ULTRASOUND OF PELVIS DOPPLER ULTRASOUND OF OVARIES TECHNIQUE: Both transabdominal and transvaginal ultrasound examinations of the pelvis were performed. Transabdominal technique was performed for global imaging of the pelvis including uterus, ovaries, adnexal regions, and pelvic cul-de-sac. It was necessary to proceed with endovaginal exam following the transabdominal exam to visualize the uterus and ovaries. Color and duplex Doppler ultrasound was utilized to evaluate blood flow to the ovaries. COMPARISON:  None. FINDINGS: Uterus Measurements: 5.9 x 3.0 x 3.9 cm. No fibroids or other mass visualized. Endometrium Thickness: 11.6 mm.  No  focal abnormality visualized. Right ovary Measurements: 4.6 x 2.7 x 3.1 cm. Normal appearance/no adnexal mass. Left ovary Measurements: 3.6 x 2.8 x 3.7 cm. 2.4 x 1.7 x 2.4 cm complex cystic lesion with peripherally increased vascularity, most consistent with a degenerating corpus luteal cyst. Pulsed Doppler evaluation of both ovaries demonstrates normal low-resistance arterial and venous waveforms. Other findings Small volume free fluid within the pelvis, most likely physiologic. IMPRESSION: 1. 2.4 cm degenerating left ovarian corpus luteal cyst with small volume free physiologic fluid within the pelvis. 2. Otherwise unremarkable pelvic ultrasound. No evidence for torsion. Electronically Signed   By: Rise MuBenjamin  McClintock M.D.   On: 11/09/2017 22:48   [place] Art/ven Flow Abd Pelv Doppler  Result Date: 11/09/2017 CLINICAL DATA:  Initial evaluation for acute pelvic pain for 2 weeks. EXAM: TRANSABDOMINAL AND TRANSVAGINAL ULTRASOUND OF PELVIS DOPPLER ULTRASOUND OF OVARIES TECHNIQUE: Both transabdominal and transvaginal ultrasound examinations of the pelvis were performed. Transabdominal technique was performed for global imaging of the pelvis including uterus, ovaries, adnexal regions, and pelvic cul-de-sac. It was necessary to proceed with  endovaginal exam following the transabdominal exam to visualize the uterus and ovaries. Color and duplex Doppler ultrasound was utilized to evaluate blood flow to the ovaries. COMPARISON:  None. FINDINGS: Uterus Measurements: 5.9 x 3.0 x 3.9 cm. No fibroids or other mass visualized. Endometrium Thickness: 11.6 mm.  No focal abnormality visualized. Right ovary Measurements: 4.6 x 2.7 x 3.1 cm. Normal appearance/no adnexal mass. Left ovary Measurements: 3.6 x 2.8 x 3.7 cm. 2.4 x 1.7 x 2.4 cm complex cystic lesion with peripherally increased vascularity, most consistent with a degenerating corpus luteal cyst. Pulsed Doppler evaluation of both ovaries demonstrates normal low-resistance arterial and venous waveforms. Other findings Small volume free fluid within the pelvis, most likely physiologic. IMPRESSION: 1. 2.4 cm degenerating left ovarian corpus luteal cyst with small volume free physiologic fluid within the pelvis. 2. Otherwise unremarkable pelvic ultrasound. No evidence for torsion. Electronically Signed   By: Rise MuBenjamin  McClintock M.D.   On: 11/09/2017 22:48    Procedures Procedures (including critical care time)  Medications Ordered in ED Medications  cefTRIAXone (ROCEPHIN) injection 250 mg (250 mg Intramuscular Given 11/09/17 2208)  metroNIDAZOLE (FLAGYL) tablet 2,000 mg (2,000 mg Oral Given 11/09/17 2207)  ondansetron (ZOFRAN-ODT) disintegrating tablet 4 mg (4 mg Oral Given 11/09/17 2156)  doxycycline (VIBRA-TABS) tablet 100 mg (100 mg Oral Given 11/09/17 2207)  lidocaine (PF) (XYLOCAINE) 1 % injection (0.9 mLs  Given 11/09/17 2210)  ketorolac (TORADOL) 30 MG/ML injection 15 mg (15 mg Intramuscular Given 11/10/17 0012)     Initial Impression / Assessment and Plan / ED Course  I have reviewed the triage vital signs and the nursing notes.  Pertinent labs & imaging results that were available during my care of the patient were reviewed by me and considered in my medical decision making (see  chart for details).     Patient presents to the ED for evaluation of lower pelvic and abdominal pain.  Patient with history of ovarian cyst.  She is sexually active and reports some vaginal discharge.  Patient denies any associated fevers, vaginal bleeding, emesis, change in bowel habits.  On exam patient is overall well-appearing and nontoxic.  Vital signs are very reassuring.  She is afebrile, no tachycardia, no hypotension noted.  Patient does have some diffuse lower abdominal tenderness to palpation.  She also has some lower pelvic tenderness to palpation.  Otherwise patient has no signs of peritonitis.  On pelvic exam patient  does have some cervical motion tenderness with discharge noted.  No vaginal bleeding noted.  Lab work is reassuring.  No signs of leukocytosis.  Electrolytes are reassuring and at patient's baseline.  UA with large leukocytes, rare bacteria and large squamous epithelium cells.  Also notes trichomonas.  Patient has no urinary symptoms.  Low suspicion for UTI or pyelonephritis.  We will not treat for UTI.  Wet prep reveals Trichomonas, clue cells and WBCs.  Gonorrhea and Chlamydia cultures are pending.  Given the cervical motion tenderness with the trichomonas concern for PID.  Will treat with IM Rocephin, p.o. Flagyl and p.o. Doxy.  Given history of ovarian cyst will evaluate for any ovarian torsion or cyst in any associated abscess given PID  Ultrasound returned that does show a corpus luteal cyst of the left ovary.  It appears to have ruptured with a small amount of free fluid.  Otherwise no other acute findings.  Patient has no focal abdominal tenderness.  Low suspicion for appendicitis.  Again doubt UTI or pyelonephritis given no urinary symptoms.  Repeat abdominal exam is benign and no signs of peritonitis.  Again we will treat patient with antibiotics for PID.  Have encouraged symptomatic treatment and NSAID use for the ruptured cyst.  Have also encouraged her to  follow-up with OB/GYN next week.  Have discussed very strict return because with patient and she verbalized understanding the plan of care.  Pt is hemodynamically stable, in NAD, & able to ambulate in the ED. Evaluation does not show pathology that would require ongoing emergent intervention or inpatient treatment. I explained the diagnosis to the patient. Pain has been managed & has no complaints prior to dc. Pt is comfortable with above plan and is stable for discharge at this time. All questions were answered prior to disposition. Strict return precautions for f/u to the ED were discussed. Encouraged follow up with PCP.   Final Clinical Impressions(s) / ED Diagnoses   Final diagnoses:  Cyst of left ovary  Pelvic pain  PID (acute pelvic inflammatory disease)    ED Discharge Orders        Ordered    doxycycline (VIBRAMYCIN) 100 MG capsule  2 times daily     11/09/17 2359    naproxen (NAPROSYN) 375 MG tablet  2 times daily     11/09/17 2359    ondansetron (ZOFRAN) 4 MG tablet  Every 8 hours PRN     11/09/17 2359       Rise Mu, PA-C 11/10/17 0135    Raeford Razor, MD 11/13/17 1146

## 2017-11-11 ENCOUNTER — Encounter: Payer: Self-pay | Admitting: Advanced Practice Midwife

## 2017-11-12 ENCOUNTER — Inpatient Hospital Stay (HOSPITAL_COMMUNITY): Payer: Medicaid Other

## 2017-11-12 ENCOUNTER — Encounter (HOSPITAL_COMMUNITY): Payer: Self-pay | Admitting: *Deleted

## 2017-11-12 ENCOUNTER — Other Ambulatory Visit: Payer: Self-pay | Admitting: Student

## 2017-11-12 ENCOUNTER — Inpatient Hospital Stay (HOSPITAL_COMMUNITY)
Admission: AD | Admit: 2017-11-12 | Discharge: 2017-11-12 | Disposition: A | Discharge status: Home / Self Care | Payer: Medicaid Other | Source: Ambulatory Visit | Attending: Obstetrics and Gynecology | Admitting: Obstetrics and Gynecology

## 2017-11-12 DIAGNOSIS — N83202 Unspecified ovarian cyst, left side: Secondary | ICD-10-CM | POA: Insufficient documentation

## 2017-11-12 DIAGNOSIS — R102 Pelvic and perineal pain: Secondary | ICD-10-CM | POA: Diagnosis not present

## 2017-11-12 DIAGNOSIS — R109 Unspecified abdominal pain: Secondary | ICD-10-CM | POA: Diagnosis present

## 2017-11-12 LAB — COMPREHENSIVE METABOLIC PANEL
ALBUMIN: 3.5 g/dL (ref 3.5–5.0)
ALT: 15 U/L (ref 14–54)
ANION GAP: 9 (ref 5–15)
AST: 14 U/L — AB (ref 15–41)
Alkaline Phosphatase: 78 U/L (ref 38–126)
BILIRUBIN TOTAL: 0.5 mg/dL (ref 0.3–1.2)
BUN: 15 mg/dL (ref 6–20)
CHLORIDE: 106 mmol/L (ref 101–111)
CO2: 24 mmol/L (ref 22–32)
Calcium: 8.4 mg/dL — ABNORMAL LOW (ref 8.9–10.3)
Creatinine, Ser: 0.63 mg/dL (ref 0.44–1.00)
GFR calc Af Amer: >60 mL/min (ref >60)
GFR calc non Af Amer: >60 mL/min (ref >60)
GLUCOSE: 94 mg/dL (ref 65–99)
POTASSIUM: 3.8 mmol/L (ref 3.5–5.1)
SODIUM: 139 mmol/L (ref 135–145)
TOTAL PROTEIN: 7.1 g/dL (ref 6.5–8.1)

## 2017-11-12 LAB — CBC
HEMATOCRIT: 41.5 % (ref 36.0–46.0)
Hemoglobin: 13.1 g/dL (ref 12.0–15.0)
MCH: 26.8 pg (ref 26.0–34.0)
MCHC: 31.6 g/dL (ref 30.0–36.0)
MCV: 84.9 fL (ref 78.0–100.0)
PLATELETS: 262 10*3/uL (ref 150–400)
RBC: 4.89 MIL/uL (ref 3.87–5.11)
RDW: 14.3 % (ref 11.5–15.5)
WBC: 9 10*3/uL (ref 4.0–10.5)

## 2017-11-12 LAB — WET PREP, GENITAL
Clue Cells Wet Prep HPF POC: NONE SEEN
SPERM: NONE SEEN
TRICH WET PREP: NONE SEEN
YEAST WET PREP: NONE SEEN

## 2017-11-12 LAB — URINALYSIS, ROUTINE W REFLEX MICROSCOPIC
Bacteria, UA: NONE SEEN
Bilirubin Urine: NEGATIVE
Glucose, UA: NEGATIVE mg/dL
Hgb urine dipstick: NEGATIVE
KETONES UR: NEGATIVE mg/dL
Nitrite: NEGATIVE
PH: 6 (ref 5.0–8.0)
Protein, ur: NEGATIVE mg/dL
SPECIFIC GRAVITY, URINE: 1.017 (ref 1.005–1.030)

## 2017-11-12 LAB — GC/CHLAMYDIA PROBE AMP (~~LOC~~) NOT AT ARMC
Chlamydia: NEGATIVE
NEISSERIA GONORRHEA: NEGATIVE

## 2017-11-12 LAB — POCT PREGNANCY, URINE: Preg Test, Ur: NEGATIVE

## 2017-11-12 IMAGING — US US TRANSVAGINAL NON-OB
1 series · 15 of 25 positions shown · non-contrast
Comparison: [DATE]

CLINICAL DATA: Pelvic pain since [REDACTED]

EXAM:
ULTRASOUND PELVIS TRANSVAGINAL
TECHNIQUE: Transvaginal ultrasound examination of the pelvis was performed
including evaluation of the uterus, ovaries, adnexal regions, and
pelvic cul-de-sac.

[Series 1: us transvaginal non-ob · 15 of 40 slices shown]
[im 1/40]
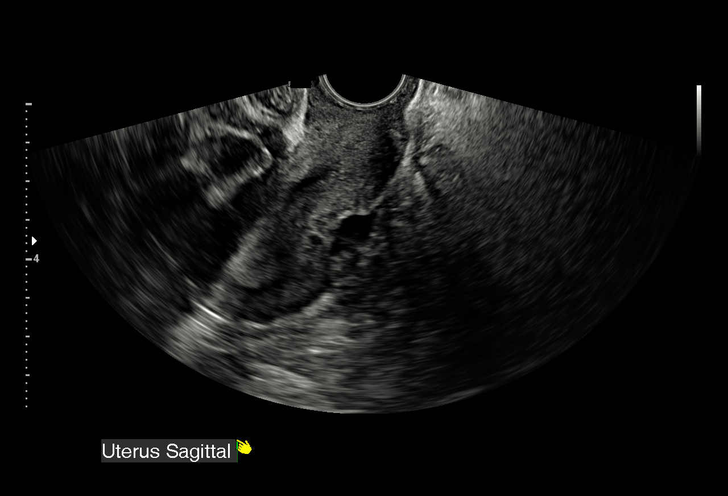
[im 4/40]
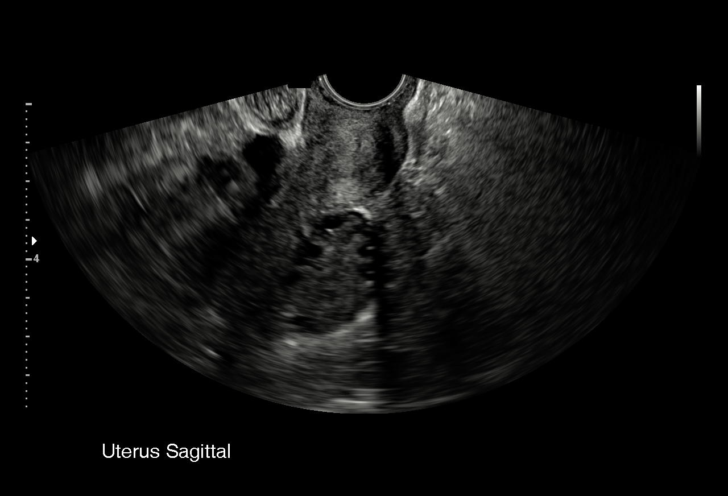
[im 7/40]
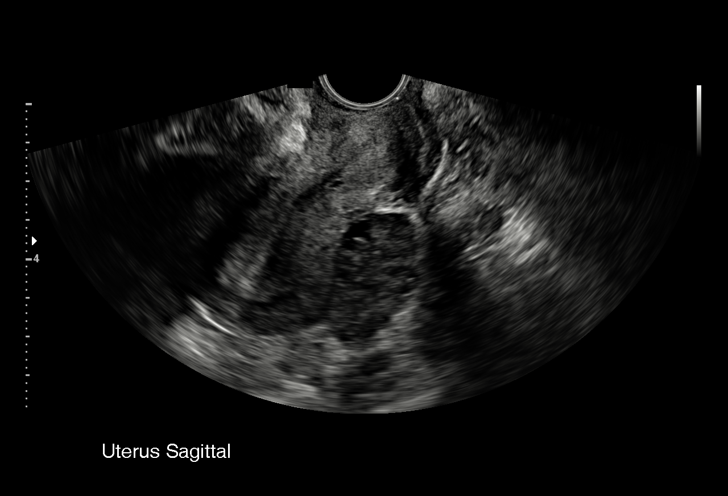
[im 9/40]
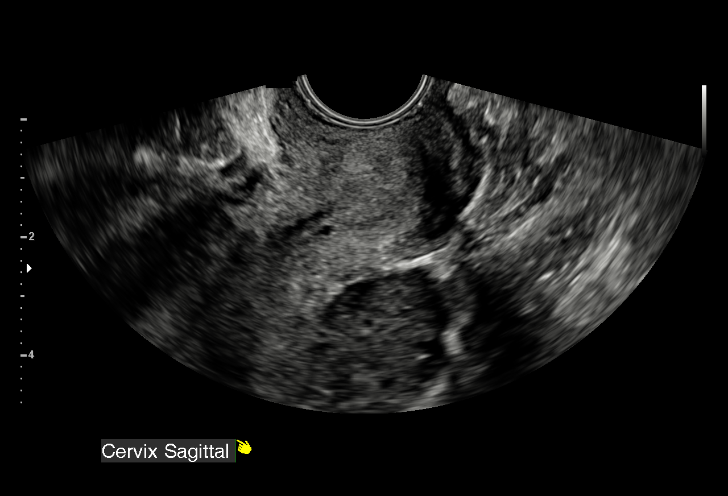
[im 12/40]
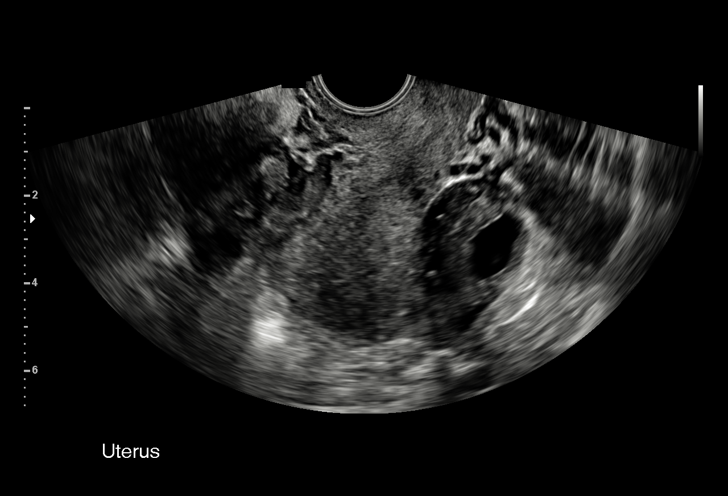
[im 15/40]
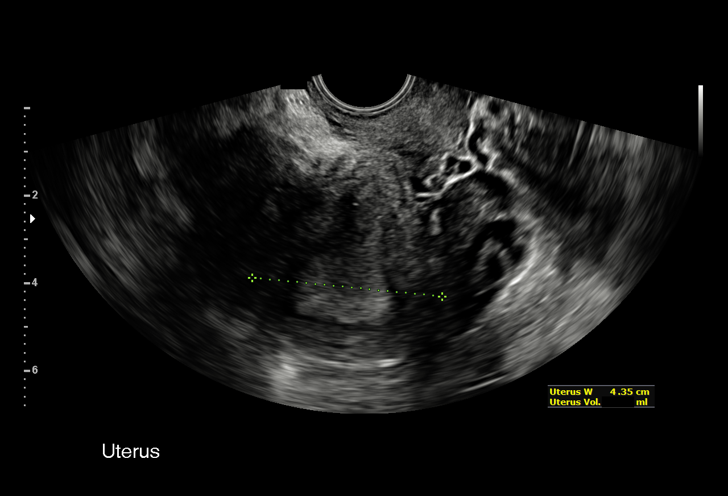
[im 17/40]
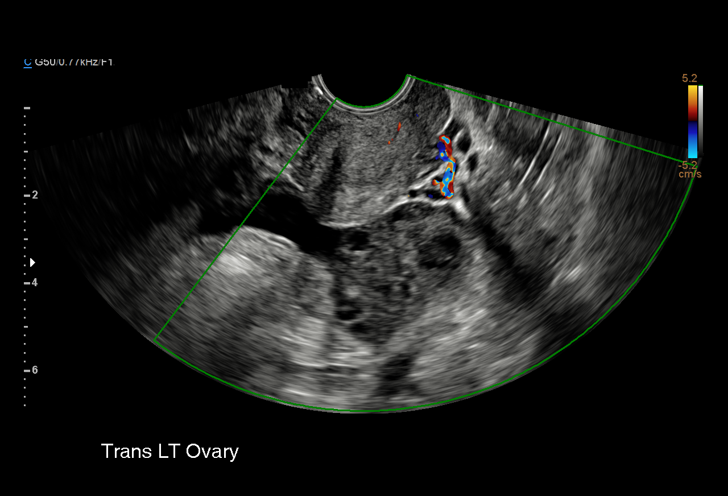
[im 20/40]
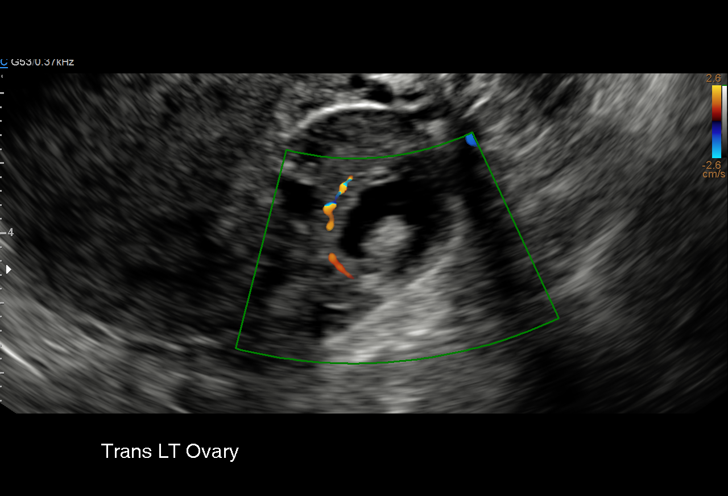
[im 23/40]
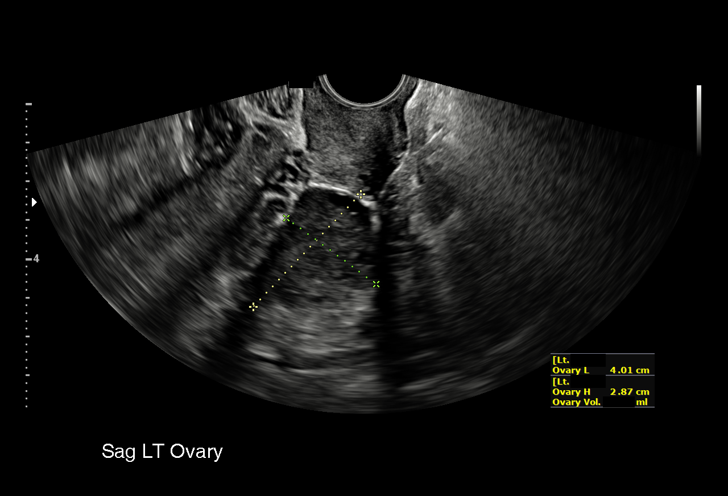
[im 25/40]
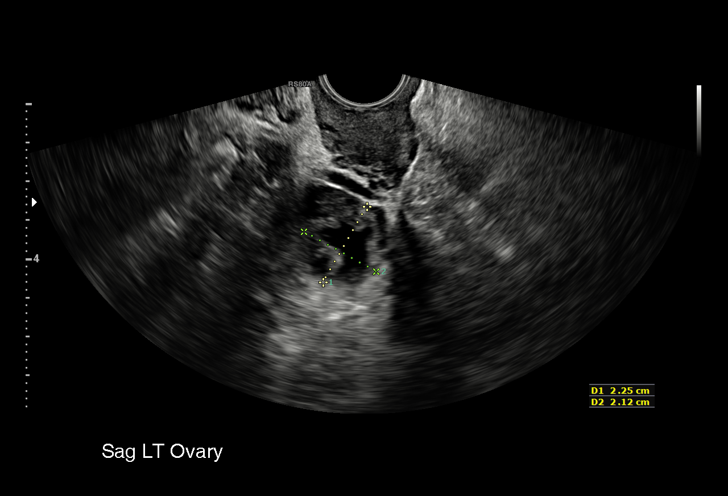
[im 28/40]
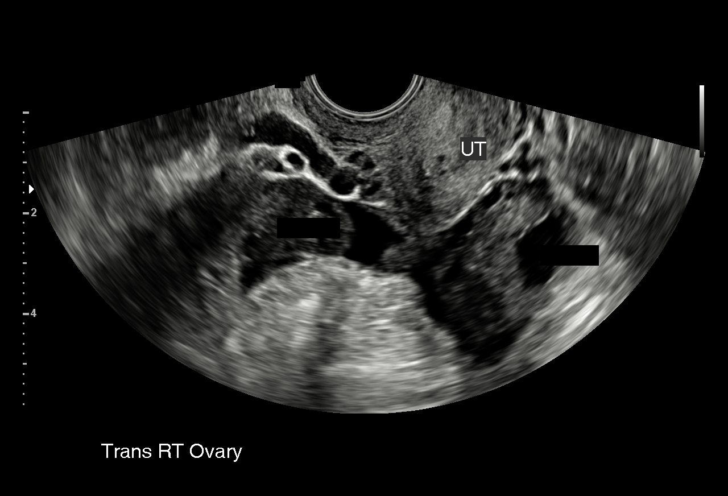
[im 31/40]
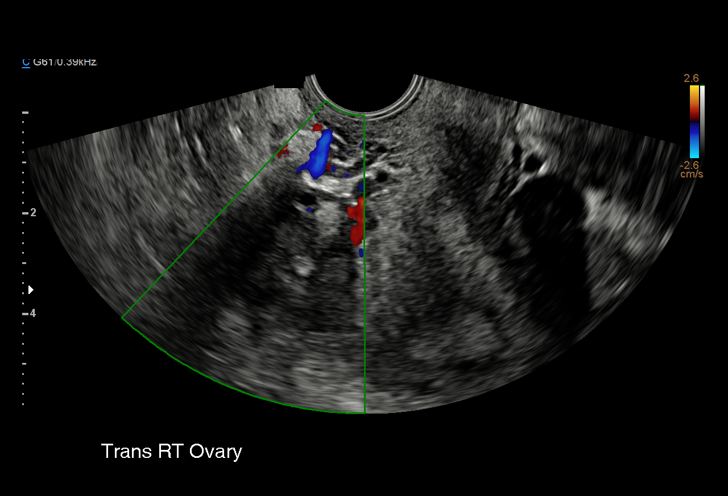
[im 33/40]
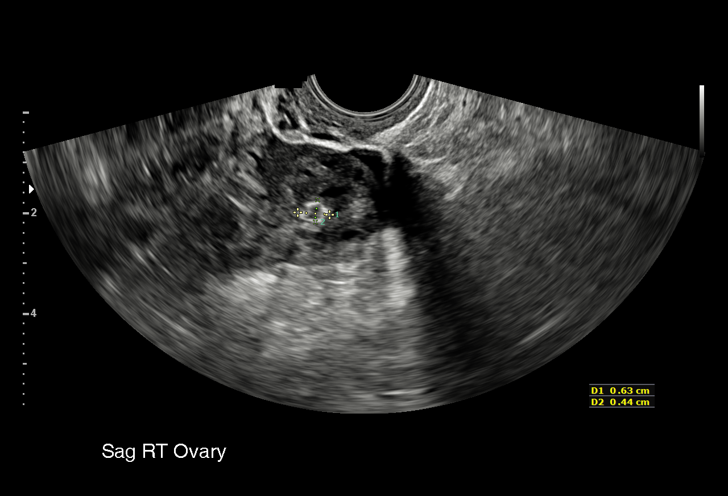
[im 36/40]
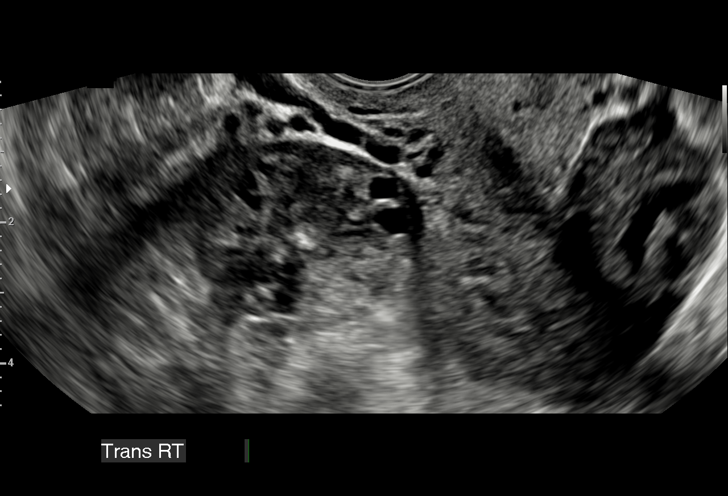
[im 40/40]
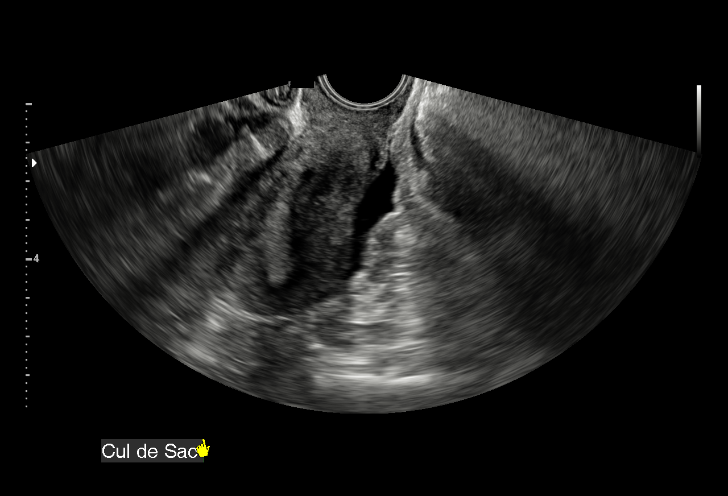

[15 of 25 positions shown; findings below may reference images not displayed]

FINDINGS: Uterus

Measurements: 7.2 x 3.5 x 4.4 cm. No fibroids or other mass
visualized.

Endometrium

Thickness: 16 mm.  No focal abnormality visualized.

Right ovary

Measurements: 3.6 x 2.3 x 2.5 cm. 6 x 4.5 mm rim calcified lesion,
possibly reflecting sequela of a prior hemorrhagic cyst.

Left ovary

Measurements: 4.0 x 2.9 x 2.7 cm. 2.3 x 2.1 x 1.7 cm involuting
corpus luteal cyst.

Other findings:  Small volume pelvic fluid in the bilateral adnexa.
IMPRESSION: 2.3 cm involuting left corpus luteal cyst, grossly unchanged.

## 2017-11-12 MED ORDER — AZITHROMYCIN 1 G PO PACK: 1.0000 g | PACK | Freq: Once | ORAL | Status: DC

## 2017-11-12 MED ORDER — OXYCODONE-ACETAMINOPHEN 5-325 MG PO TABS: 1.0000 | ORAL_TABLET | ORAL | 0 refills | Status: DC | PRN

## 2017-11-12 MED ORDER — TRAMADOL HCL 50 MG PO TABS: 50.0000 mg | ORAL_TABLET | Freq: Four times a day (QID) | ORAL | 0 refills | Status: DC | PRN

## 2017-11-12 MED ORDER — TRAMADOL HCL 50 MG PO TABS
50.0000 mg | ORAL_TABLET | Freq: Once | ORAL | Status: AC
  Administered 2017-11-12: 50 mg via ORAL
  Filled 2017-11-12: qty 1

## 2017-11-12 MED ORDER — SODIUM CHLORIDE 0.9 % IV SOLN
INTRAVENOUS | Status: DC
  Administered 2017-11-12: 11:00:00 via INTRAVENOUS

## 2017-11-12 MED ORDER — SODIUM CHLORIDE 0.9 % IV SOLN
8.0000 mg | Freq: Once | INTRAVENOUS | Status: AC
  Administered 2017-11-12: 8 mg via INTRAVENOUS
  Filled 2017-11-12: qty 4

## 2017-11-12 MED ORDER — AZITHROMYCIN 1 G PO PACK
1.0000 g | PACK | Freq: Once | ORAL | Status: AC
  Administered 2017-11-12: 1 g via ORAL
  Filled 2017-11-12: qty 1

## 2017-11-12 MED ORDER — KETOROLAC TROMETHAMINE 60 MG/2ML IM SOLN
60.0000 mg | Freq: Once | INTRAMUSCULAR | Status: AC
  Administered 2017-11-12: 60 mg via INTRAMUSCULAR
  Filled 2017-11-12: qty 2

## 2017-11-12 MED ORDER — AZITHROMYCIN 1 G PO PACK: 1.0000 g | PACK | ORAL | 0 refills | Status: AC

## 2017-11-12 MED ORDER — AZITHROMYCIN 1 G PO PACK: 1.0000 g | PACK | Freq: Once | ORAL | 1 refills | Status: DC

## 2017-11-12 NOTE — Discharge Instructions (Signed)
-  Azithromycin 1 gram when you feel better and have stopped vomiting. 7 days later, refill the prescription and take it again.  -Percocet for pain (10 tablets). -Tramadol for pain      Ovarian Cyst An ovarian cyst is a fluid-filled sac on an ovary. The ovaries are organs that make eggs in women. Most ovarian cysts go away on their own and are not cancerous (are benign). Some cysts need treatment. Follow these instructions at home:  Take over-the-counter and prescription medicines only as told by your doctor.  Do not drive or use heavy machinery while taking prescription pain medicine.  Get pelvic exams and Pap tests as often as told by your doctor.  Return to your normal activities as told by your doctor. Ask your doctor what activities are safe for you.  Do not use any products that contain nicotine or tobacco, such as cigarettes and e-cigarettes. If you need help quitting, ask your doctor.  Keep all follow-up visits as told by your doctor. This is important. Contact a doctor if:  Your periods are: ? Late. ? Irregular. ? Painful.  Your periods stop.  You have pelvic pain that does not go away.  You have pressure on your bladder.  You have trouble making your bladder empty when you pee (urinate).  You have pain during sex.  You have any of the following in your belly (abdomen): ? A feeling of fullness. ? Pressure. ? Discomfort. ? Pain that does not go away. ? Swelling.  You feel sick most of the time.  You have trouble pooping (have constipation).  You are not as hungry as usual (you lose your appetite).  You get very bad acne.  You start to have more hair on your body and face.  You are gaining weight or losing weight without changing your exercise and eating habits.  You think you may be pregnant. Get help right away if:  You have belly pain that is very bad or gets worse.  You cannot eat or drink without throwing up (vomiting).  You suddenly get a  fever.  Your period is a lot heavier than usual. This information is not intended to replace advice given to you by your health care provider. Make sure you discuss any questions you have with your health care provider. Document Released: 05/01/2008 Document Revised: 06/02/2016 Document Reviewed: 04/16/2016 Elsevier Interactive Patient Education  2017 [employer]

## 2017-11-12 NOTE — MAU Note (Signed)
Pt stated she went to the ER on Friday for abd pain. Told she had a ruptured  Ovarian cyst. Reports  She is still having a lot of pai on her left side. Taking tylenol without relief

## 2017-11-12 NOTE — MAU Provider Note (Signed)
Patient Alexandra Gray is a 26 y.o. G1P1001 At Unknown gestation here with abdominal pain. Patient was seen in [facility] ED on 12-15 and found to have an ovarian cyst (corpus luteal). Patient was also found to have trich; she was given 2 grams of flagyl and rocephin after diagnosis of PID.  Gonorhea and Chlamydia cultures were collected but not resulted.    History     CSN: [id number]  Arrival date and time: 11/12/17 [id number]   First Provider Initiated Contact with Patient 11/12/17 [phone]      Chief Complaint  Patient presents with  . Abdominal Pain   HPI Patient states that she has been having abdominal pain since last week. She went to the ED and they told her she had a cyst.  Patient is here because she is still in pain. She rates the pain a 10/10; she describes it as sharp. She says that Tylenol does not work for it. It hurts to walk. She denies vaginal discharge or vaginal bleeding.  OB History    Gravida Para Term Preterm AB Living   1 1 1     1    SAB TAB Ectopic Multiple Live Births         0 1      Past Medical History:  Diagnosis Date  . Chlamydia   . Depression    doing ok now  . Gonorrhea   . Hypertension    sometimes is up, never on meds  . Mental disorder   . Ovarian cyst   . PID (pelvic inflammatory disease)   . Sleep apnea     Past Surgical History:  Procedure Laterality Date  . CESAREAN SECTION N/A 10/03/2015   Procedure: CESAREAN SECTION;  Surgeon: Tilda BurrowJohn Ferguson V, MD;  Location: [facility];  Service: Obstetrics;  Laterality: N/A;  . KNEE SURGERY      Family History  Problem Relation Age of Onset  . Healthy Mother   . Healthy Father   . Healthy Brother   . Cancer Neg Hx   . Diabetes Neg Hx   . Heart disease Neg Hx   . Hypertension Neg Hx   . Stroke Neg Hx   . Hearing loss Neg Hx     Social History   Tobacco Use  . Smoking status: Never Smoker  . Smokeless tobacco: Never Used  Substance Use Topics  . Alcohol use: No  . Drug use: No     Allergies: No Known Allergies  Medications Prior to Admission  Medication Sig Dispense Refill Last Dose  . doxycycline (VIBRAMYCIN) 100 MG capsule Take 1 capsule (100 mg total) by mouth 2 (two) times daily. 28 capsule 0   . ibuprofen (ADVIL,MOTRIN) 600 MG tablet Take 1 tablet (600 mg total) by mouth every 6 (six) hours as needed for mild pain. (Patient not taking: Reported on 10/11/2017) 30 tablet 0 Not Taking  . naproxen (NAPROSYN) 375 MG tablet Take 1 tablet (375 mg total) by mouth 2 (two) times daily. 20 tablet 0   . ondansetron (ZOFRAN) 4 MG tablet Take 1 tablet (4 mg total) by mouth every 8 (eight) hours as needed for nausea or vomiting. 4 tablet 0   . oxyCODONE-acetaminophen (PERCOCET/ROXICET) 5-325 MG tablet Take 1 tablet by mouth every 4 (four) hours as needed (for pain scale 4-7). (Patient not taking: Reported on 10/11/2017) 30 tablet 0 Not Taking  . Prenatal Multivit-Min-Fe-FA (PRENATAL VITAMINS) 0.8 MG tablet Take 1 tablet by mouth daily. (Patient not taking: Reported  on 10/11/2017) 30 tablet 12 Not Taking  . sertraline (ZOLOFT) 50 MG tablet TAKE ONE TABLET BY MOUTH ONCE DAILY (Patient not taking: Reported on 10/11/2017) 30 tablet 0 Not Taking  . topiramate (TOPAMAX) 25 MG tablet Take 1 tablet (25 mg total) daily by mouth. 30 tablet 5     Review of Systems  HENT: Negative.   Respiratory: Negative.   Cardiovascular: Negative.   Gastrointestinal: Positive for abdominal pain.  Genitourinary: Negative.   Musculoskeletal: Negative.   Neurological: Negative.   Psychiatric/Behavioral: Negative.    Physical Exam   Blood pressure 124/76, pulse 90, temperature 98.5 F (36.9 C), resp. rate 18, height 5' (1.524 m), weight 294 lb (133.4 kg), last menstrual period 10/16/2017, currently breastfeeding.  Physical Exam  Constitutional: She is oriented to person, place, and time. She appears well-developed.  HENT:  Head: Normocephalic.  Neck: Normal range of motion.  Respiratory:  Effort normal.  GI: Soft. There is tenderness.  Genitourinary: Vagina normal.  Genitourinary Comments: NEFG; pressure over right adnexal but no pain, no severe CMT, no suprapubic pain. Left adnexa tender to palpation.   Musculoskeletal: Normal range of motion.  Neurological: She is alert and oriented to person, place, and time.  Skin: Skin is warm and dry.  Psychiatric: She has a normal mood and affect.    MAU Course  Procedures  MDM -repeat US shows no evidence of torsion -patient had shot of toradol, pain only slightly relieved.  -threw up her azithmycin  and tramadol so was given IV fluids and zofran, threw up her azithromycin and tramadol again. At this point, patient does not want any more medications here and desires discharge.   RX for  azithromycin 1 gram for PID as alternative to doxycycline since patient has had trouble taking her antibiotics.    Assessment and Plan   1. Cyst of left ovary   2. Pelvic pain    2. Reviewed with patient that she will feel better when the fluid is reabsorbed but until then we can only manage her pain.  3. RX for Zithromax,  tramadol and percocet sent to pharmacy.  4. Patient knows to return here if patient were to significantly worsen or she were to develop other symptoms.   5. Patient to follow up at her ob-gyn PRN.   Charlesetta GaribaldiKathryn Lorraine Terrelle Ruffolo 11/12/2017, 9:03 AM

## 2017-11-13 LAB — GC/CHLAMYDIA PROBE AMP (~~LOC~~) NOT AT ARMC
Chlamydia: NEGATIVE
Neisseria Gonorrhea: NEGATIVE

## 2017-11-16 ENCOUNTER — Inpatient Hospital Stay (HOSPITAL_COMMUNITY): Payer: Medicaid Other

## 2017-11-16 ENCOUNTER — Encounter (HOSPITAL_COMMUNITY): Payer: Self-pay | Admitting: *Deleted

## 2017-11-16 ENCOUNTER — Encounter: Payer: Self-pay | Admitting: Obstetrics

## 2017-11-16 ENCOUNTER — Inpatient Hospital Stay (HOSPITAL_COMMUNITY)
Admission: AD | Admit: 2017-11-16 | Discharge: 2017-11-16 | Disposition: A | Discharge status: Home / Self Care | Payer: Medicaid Other | Source: Ambulatory Visit | Attending: Obstetrics and Gynecology | Admitting: Obstetrics and Gynecology

## 2017-11-16 ENCOUNTER — Ambulatory Visit (INDEPENDENT_AMBULATORY_CARE_PROVIDER_SITE_OTHER): Payer: Medicaid Other | Admitting: Obstetrics

## 2017-11-16 VITALS — BP 127/92 | HR 99 | Temp 98.7°F | Wt 297.0 lb

## 2017-11-16 DIAGNOSIS — R52 Pain, unspecified: Secondary | ICD-10-CM

## 2017-11-16 DIAGNOSIS — R1032 Left lower quadrant pain: Secondary | ICD-10-CM | POA: Diagnosis not present

## 2017-11-16 DIAGNOSIS — Z6841 Body Mass Index (BMI) 40.0 and over, adult: Secondary | ICD-10-CM | POA: Diagnosis not present

## 2017-11-16 DIAGNOSIS — R102 Pelvic and perineal pain: Secondary | ICD-10-CM

## 2017-11-16 DIAGNOSIS — N8312 Corpus luteum cyst of left ovary: Secondary | ICD-10-CM

## 2017-11-16 LAB — CBC WITH DIFFERENTIAL/PLATELET
BASOS ABS: 0 10*3/uL (ref 0.0–0.1)
Basophils Relative: 0 %
Eosinophils Absolute: 0.1 10*3/uL (ref 0.0–0.7)
Eosinophils Relative: 1 %
HEMATOCRIT: 40.7 % (ref 36.0–46.0)
Hemoglobin: 13.5 g/dL (ref 12.0–15.0)
LYMPHS ABS: 2.4 10*3/uL (ref 0.7–4.0)
LYMPHS PCT: 23 %
MCH: 27.3 pg (ref 26.0–34.0)
MCHC: 33.2 g/dL (ref 30.0–36.0)
MCV: 82.2 fL (ref 78.0–100.0)
Monocytes Absolute: 0.2 10*3/uL (ref 0.1–1.0)
Monocytes Relative: 2 %
NEUTROS ABS: 8.1 10*3/uL — AB (ref 1.7–7.7)
Neutrophils Relative %: 74 %
Platelets: 258 10*3/uL (ref 150–400)
RBC: 4.95 MIL/uL (ref 3.87–5.11)
RDW: 14.3 % (ref 11.5–15.5)
WBC: 10.8 10*3/uL — AB (ref 4.0–10.5)

## 2017-11-16 LAB — URINALYSIS, ROUTINE W REFLEX MICROSCOPIC
BILIRUBIN URINE: NEGATIVE
Glucose, UA: NEGATIVE mg/dL
HGB URINE DIPSTICK: NEGATIVE
Ketones, ur: NEGATIVE mg/dL
Leukocytes, UA: NEGATIVE
NITRITE: NEGATIVE
PH: 5 (ref 5.0–8.0)
Protein, ur: NEGATIVE mg/dL
SPECIFIC GRAVITY, URINE: 1.023 (ref 1.005–1.030)

## 2017-11-16 LAB — COMPREHENSIVE METABOLIC PANEL
ALK PHOS: 76 U/L (ref 38–126)
ALT: 15 U/L (ref 14–54)
AST: 13 U/L — AB (ref 15–41)
Albumin: 3.7 g/dL (ref 3.5–5.0)
Anion gap: 11 (ref 5–15)
BILIRUBIN TOTAL: 0.4 mg/dL (ref 0.3–1.2)
BUN: 16 mg/dL (ref 6–20)
CALCIUM: 8.5 mg/dL — AB (ref 8.9–10.3)
CHLORIDE: 103 mmol/L (ref 101–111)
CO2: 24 mmol/L (ref 22–32)
CREATININE: 0.72 mg/dL (ref 0.44–1.00)
Glucose, Bld: 113 mg/dL — ABNORMAL HIGH (ref 65–99)
Potassium: 3.5 mmol/L (ref 3.5–5.1)
Sodium: 138 mmol/L (ref 135–145)
TOTAL PROTEIN: 7.5 g/dL (ref 6.5–8.1)

## 2017-11-16 LAB — POCT PREGNANCY, URINE: PREG TEST UR: NEGATIVE

## 2017-11-16 IMAGING — CT CT ABD-PELV W/ CM
1 of 2 series · 15 of 32 positions shown, 19 images · IV contrast (OMNIPAQUE)
Comparison: Pelvic ultrasound [DATE].

CLINICAL DATA: Abdominal pain, left greater than right.

EXAM:
CT ABDOMEN AND PELVIS WITH CONTRAST
TECHNIQUE: Multidetector CT imaging of the abdomen and pelvis was performed
using the standard protocol following bolus administration of
intravenous contrast.
CONTRAST:  100mL [KJ] IOPAMIDOL ([KJ]) INJECTION 61%

[Series 2: routine abdomen/pelvis with · axial · 0.93mm/px · z∈[+632,+1018]mm · 15 of 85 slices shown, 19 images]
[im 4/85  soft-tissue]
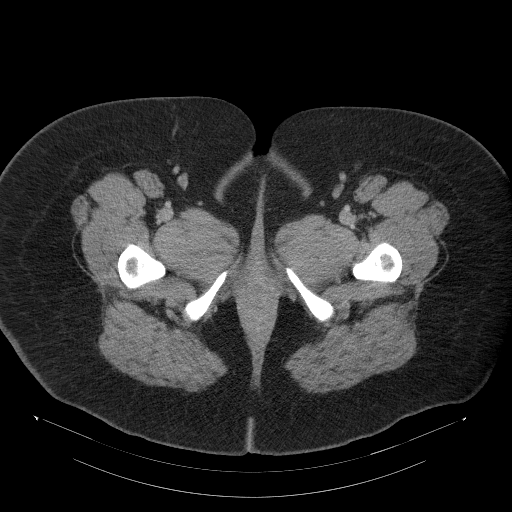
[im 4/85  bone]
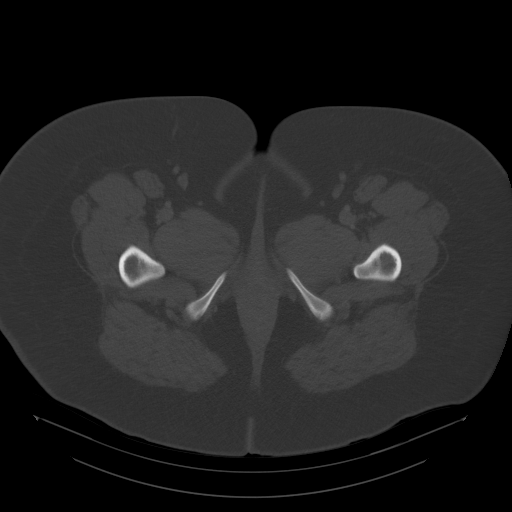
[im 10/85  soft-tissue]
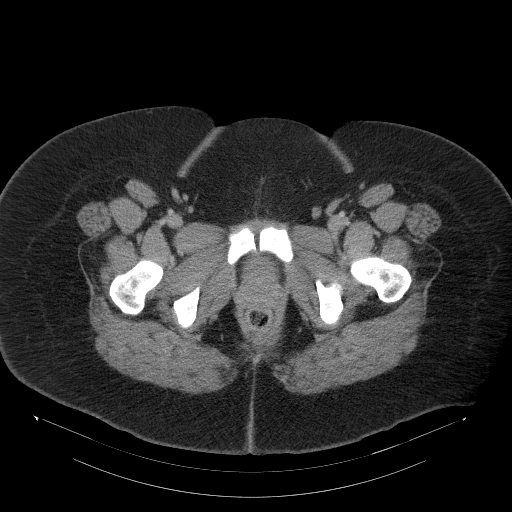
[im 17/85  soft-tissue]
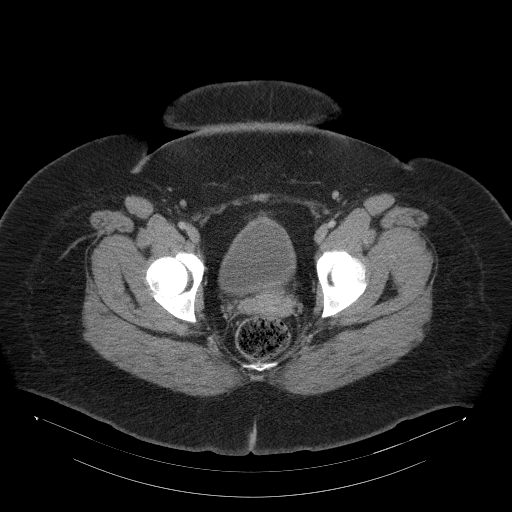
[im 23/85  soft-tissue]
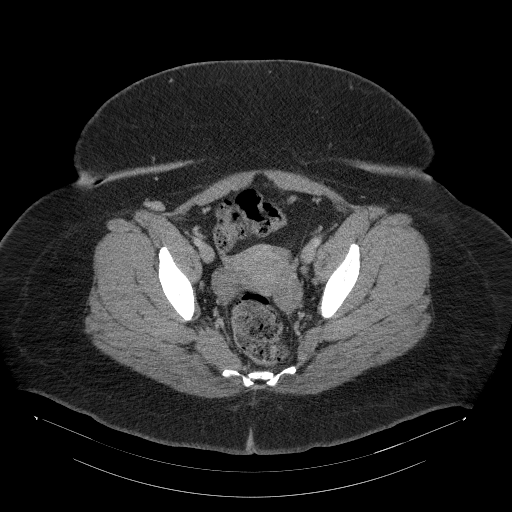
[im 30/85  soft-tissue]
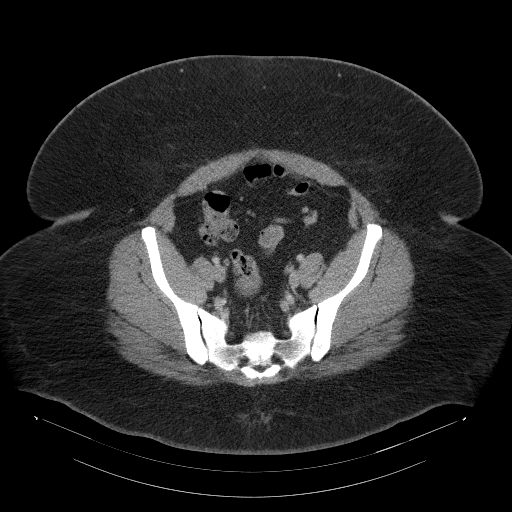
[im 36/85  soft-tissue]
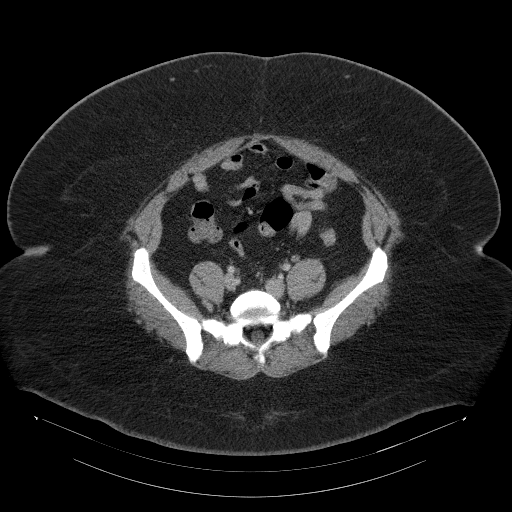
[im 43/85  soft-tissue]
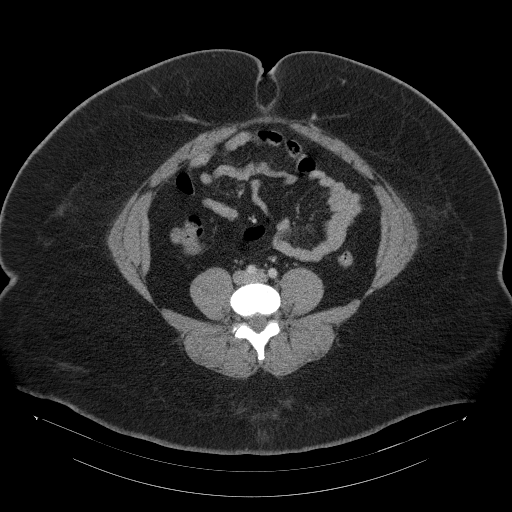
[im 49/85  soft-tissue]
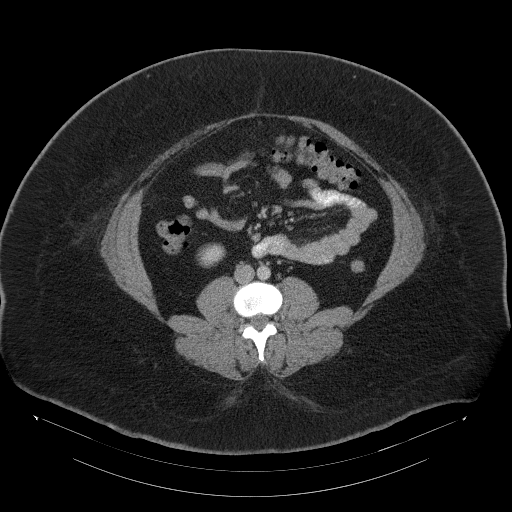
[im 55/85  soft-tissue]
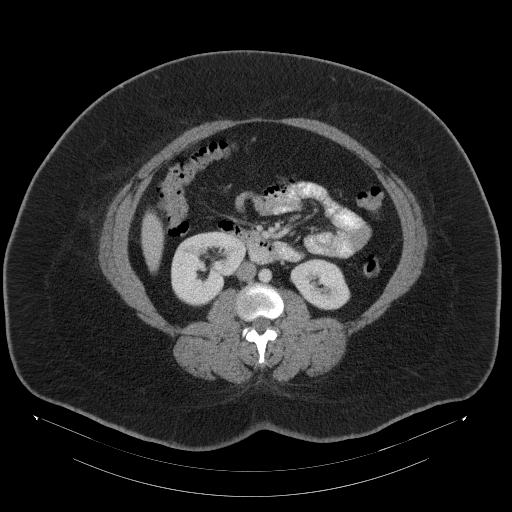
[im 55/85  bone]
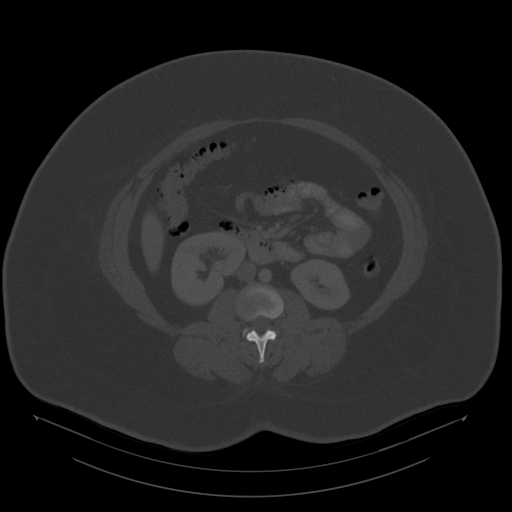
[im 62/85  soft-tissue]
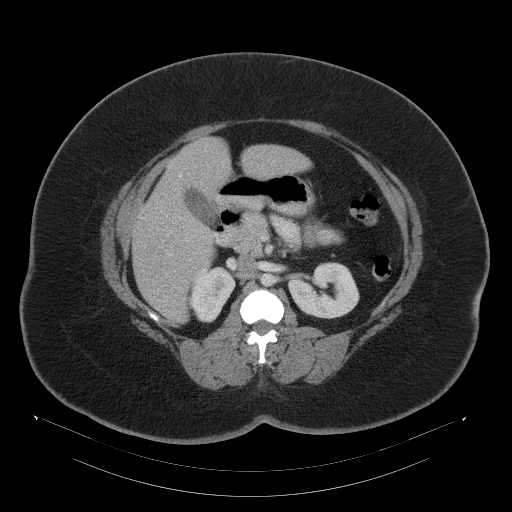
[im 68/85  soft-tissue]
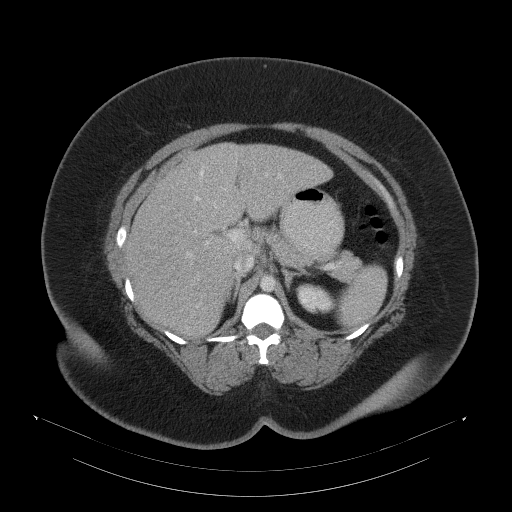
[im 72/85  lung]
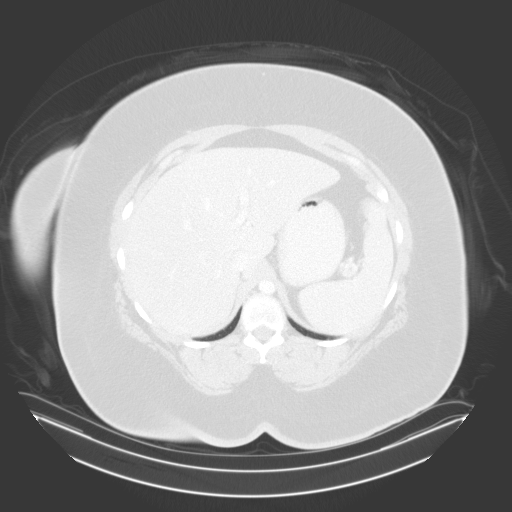
[im 75/85  soft-tissue]
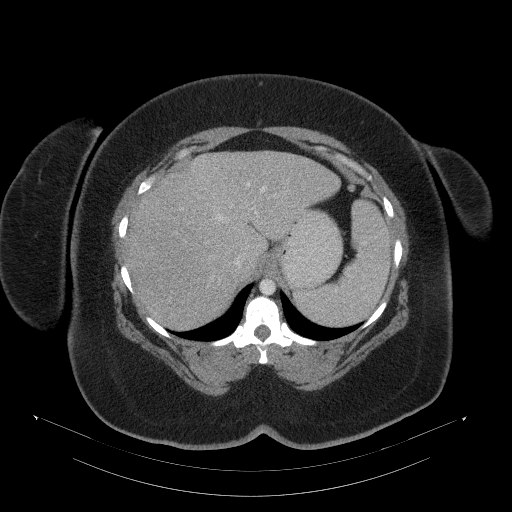
[im 75/85  lung]
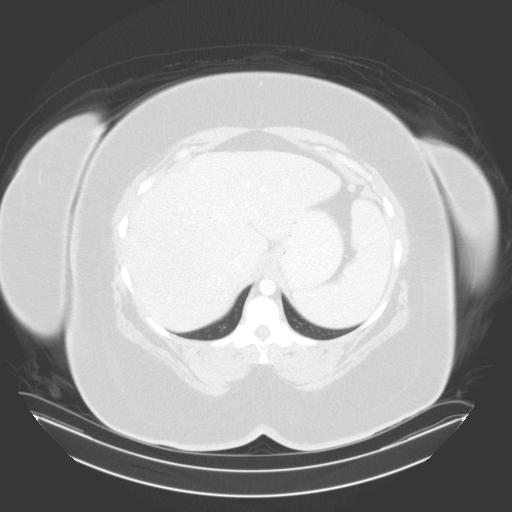
[im 78/85  lung]
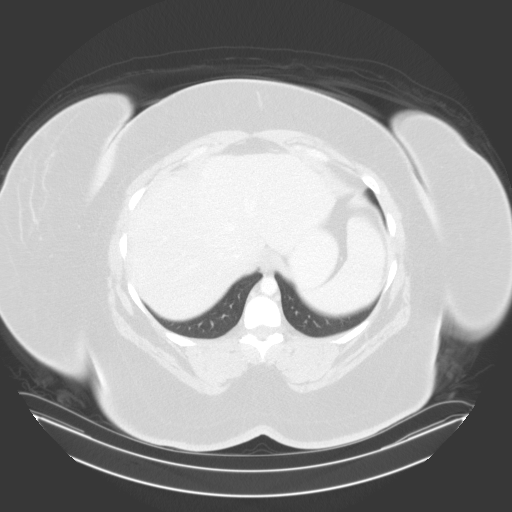
[im 81/85  soft-tissue]
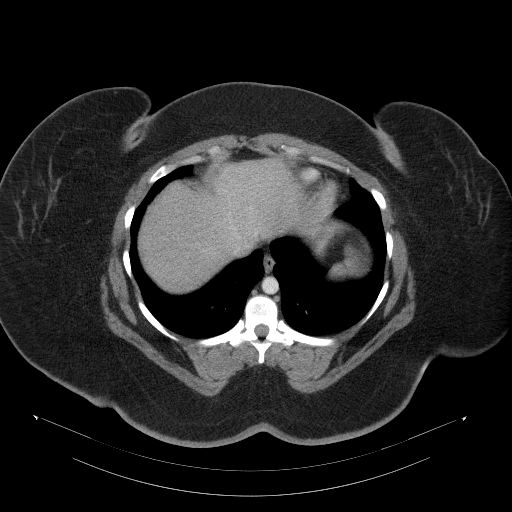
[im 81/85  lung]
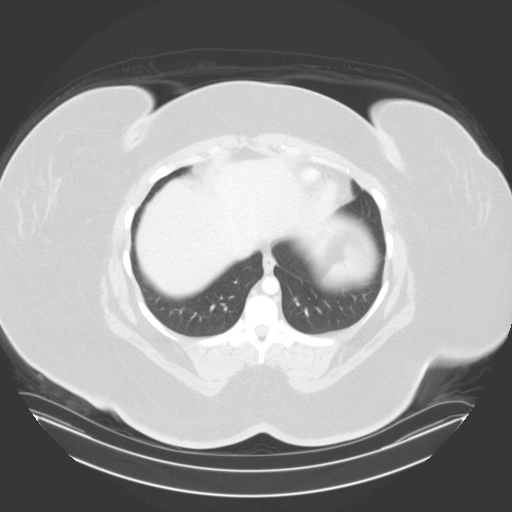

[15 of 32 positions shown; findings below may reference images not displayed]

FINDINGS: Body habitus reduces diagnostic sensitivity and specificity.

Lower chest: Unremarkable

Hepatobiliary: Unremarkable

Pancreas: Unremarkable

Spleen: Unremarkable

Adrenals/Urinary Tract: Unremarkable

Stomach/Bowel: Unremarkable.  Appendix normal.

Vascular/Lymphatic: Right common iliac node 0.8 cm in short axis,
within normal limits. Right external iliac node 1.0 cm in short axis
on image 61/2, borderline prominent. A central mesenteric lymph node
measures 0.9 cm in short axis on image 39/2. No overtly pathologic
adenopathy is identified.

Reproductive: 1.4 by 1.9 cm or post luteum or small complex
collapsed cyst of the left ovary, as shown on recent ultrasound. No
significant adjacent free pelvic fluid. Uterus unremarkable.

Other: No supplemental non-categorized findings.

Musculoskeletal: Umbilical hernia contains adipose tissue the
measures 3.2 by 2.0 by 2.1 cm.
IMPRESSION: 1. An obvious cause for the patient's left-sided abdominal pain is
not observed.
2. Borderline prominent right external iliac lymph node, likely
incidental.
3. 1.4 by 1.9 cm corpus luteum or small complex collapsed cyst of
the left ovary as shown on recent ultrasound.
4. Small umbilical hernia contains adipose tissue.

## 2017-11-16 IMAGING — US US TRANSVAGINAL NON-OB
1 series · 15 of 25 positions shown · non-contrast
Comparison: [DATE], [DATE]

CLINICAL DATA: Pelvic pain, left greater than right. Resolving
cysts. LMP [DATE].

EXAM:
ULTRASOUND PELVIS TRANSVAGINAL
TECHNIQUE: Transvaginal ultrasound examination of the pelvis was performed
including evaluation of the uterus, ovaries, adnexal regions, and
pelvic cul-de-sac.

[Series 1: us transvaginal non-ob · 15 of 26 slices shown]
[im 1/26]
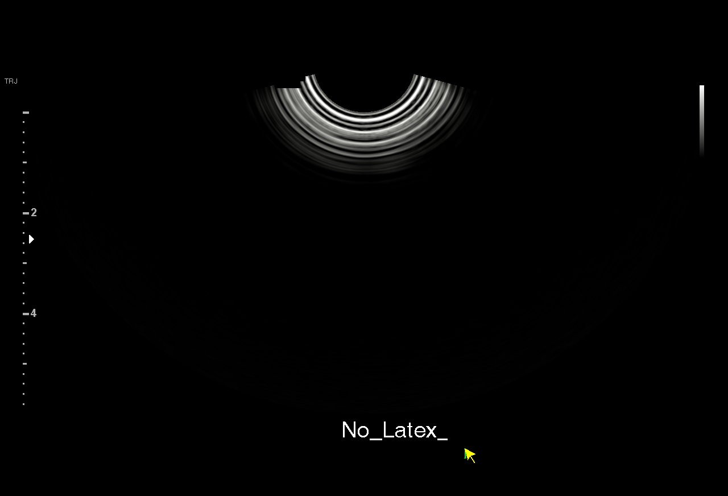
[im 3/26]
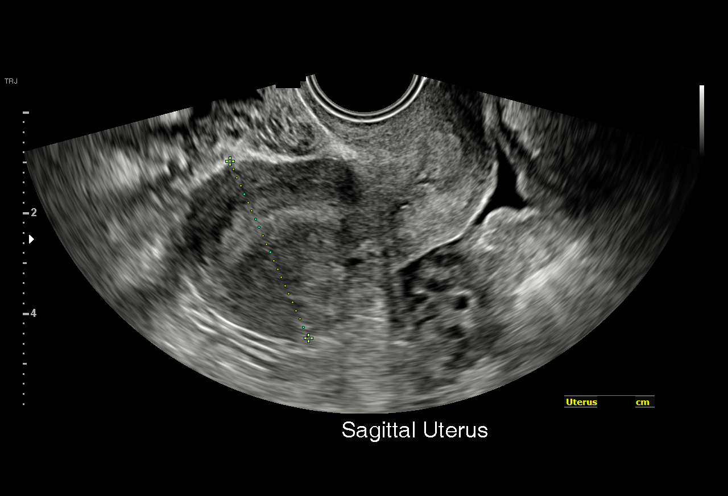
[im 5/26]
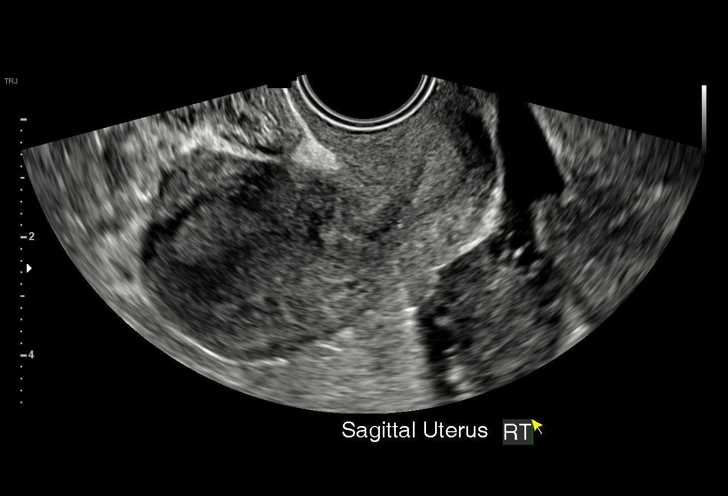
[im 6/26]
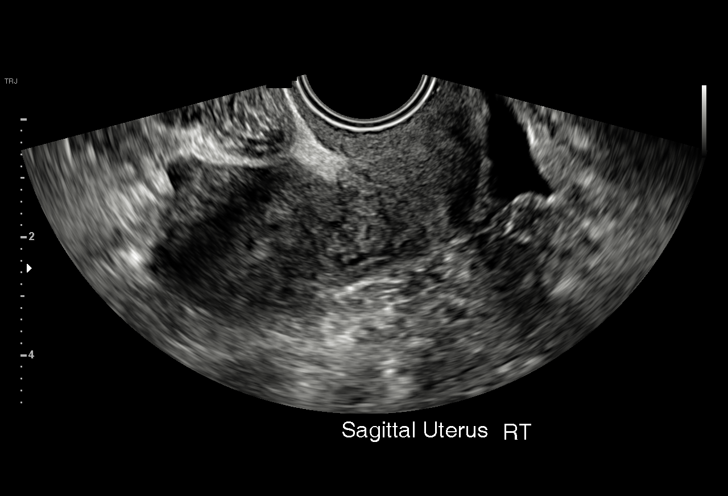
[im 8/26]
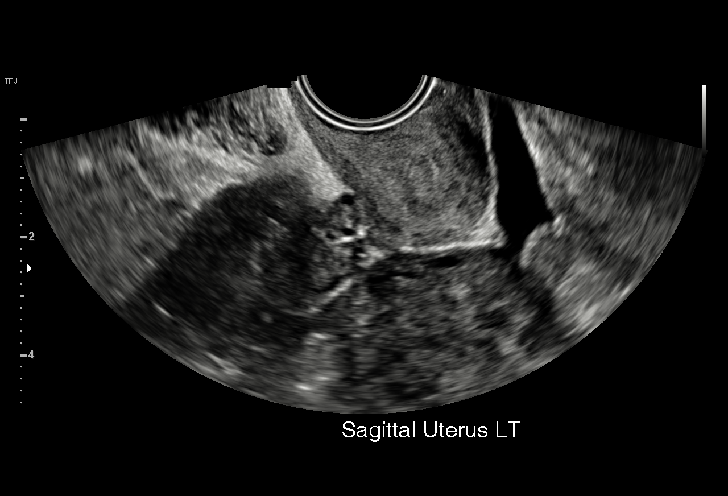
[im 10/26]
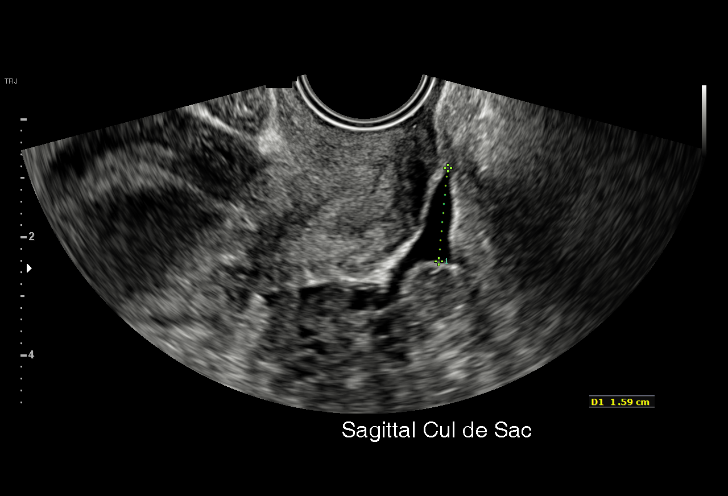
[im 11/26]
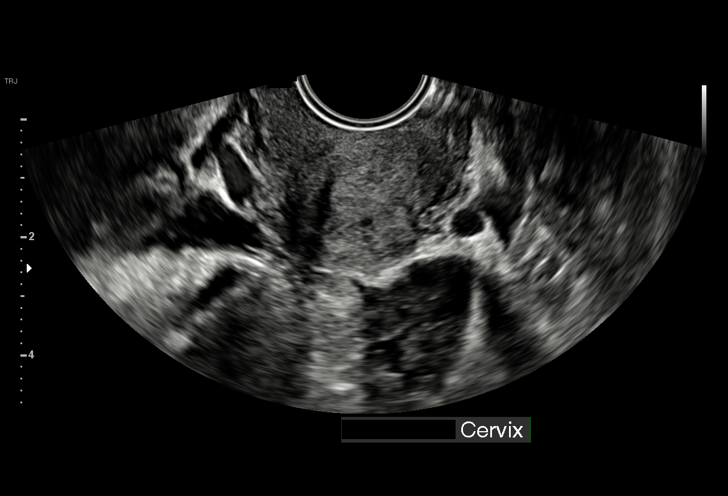
[im 13/26]
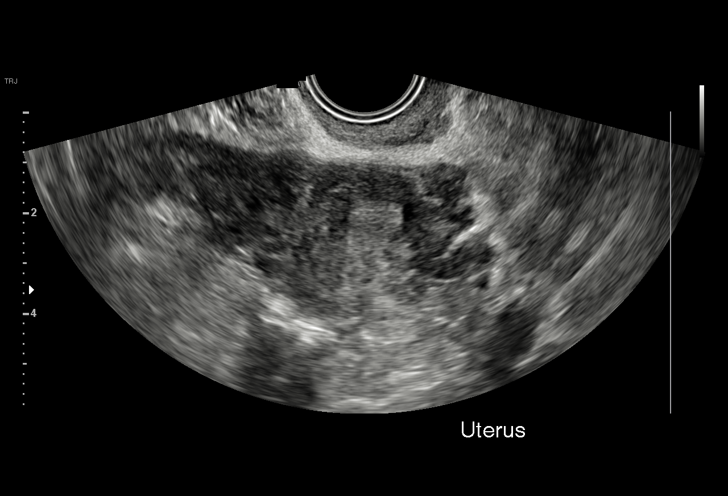
[im 15/26]
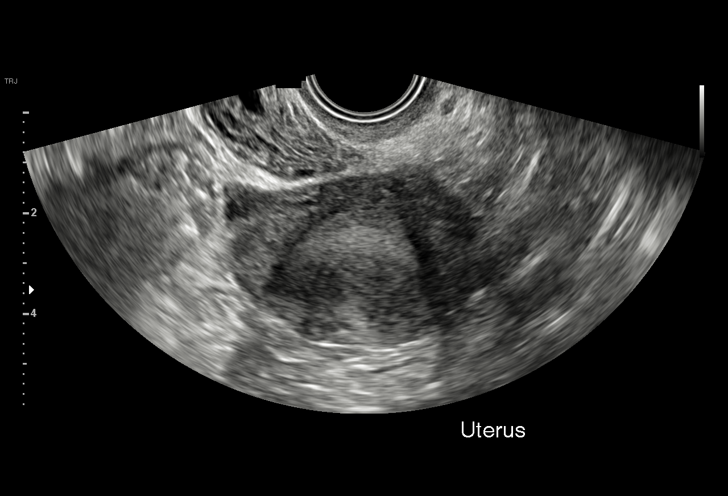
[im 16/26]
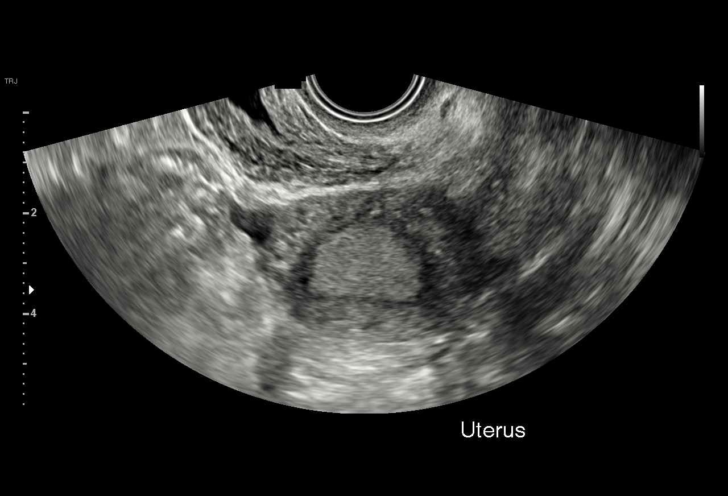
[im 18/26]
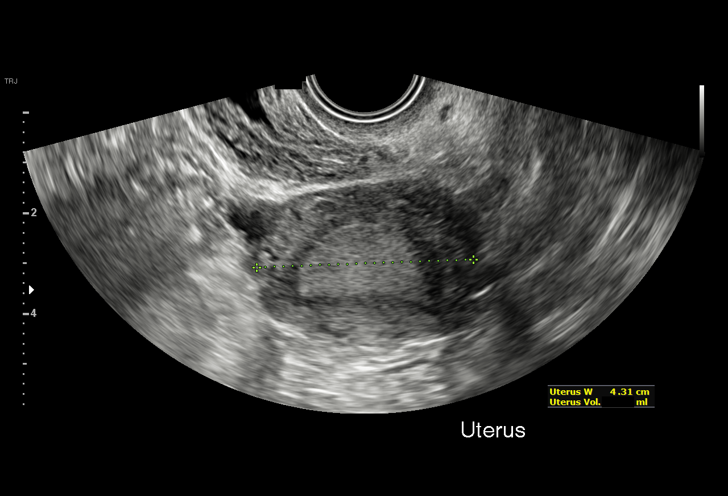
[im 20/26]
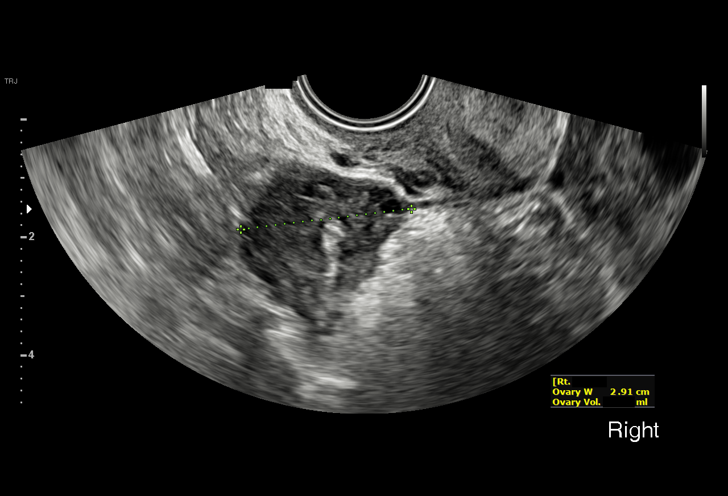
[im 21/26]
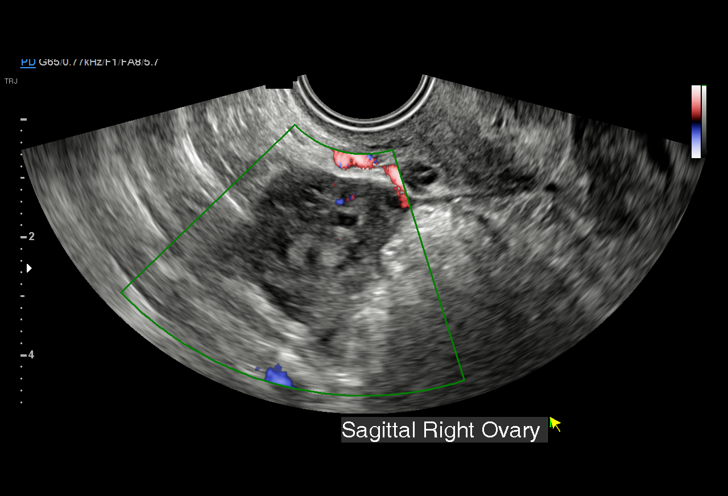
[im 23/26]
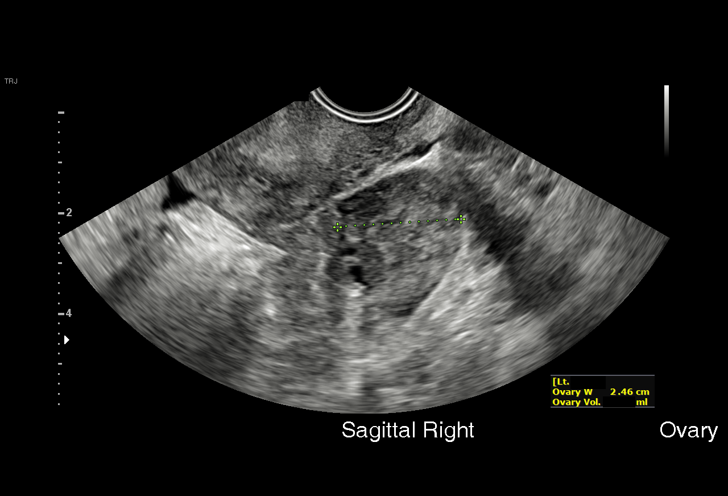
[im 26/26]
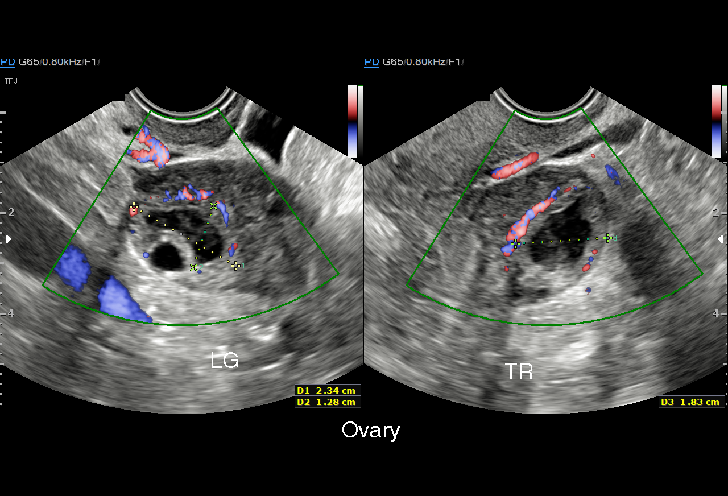

[15 of 25 positions shown; findings below may reference images not displayed]

FINDINGS: Uterus

Measurements: 6.7 x 3.8 x 4.3 cm. No fibroids or other mass
visualized.

Endometrium

Thickness: 10.7 mm.  No focal abnormality visualized.

Right ovary

Measurements: 3.7 x 2.6 x 2.7 cm. Normal appearance/no adnexal mass.

Left ovary

Measurements: 4.4 x 3.0 x 2.5 cm. Resolving left ovarian mass is
stable and now measures 2.3 x 1.3 x 1.8 cm. Previously mass measured
2.3 x 2.1 x 1.7 cm.

Other findings:  Small amount of free pelvic fluid.
IMPRESSION: 1. Normal appearance of the uterus and right ovary.
2. Stable appearance of small left ovarian mass, 2.3 cm. Benign,
resolving corpus luteum cyst is favored.

## 2017-11-16 MED ORDER — IOPAMIDOL (ISOVUE-300) INJECTION 61%
100.0000 mL | Freq: Once | INTRAVENOUS | Status: AC | PRN
  Administered 2017-11-16: 100 mL via INTRAVENOUS

## 2017-11-16 MED ORDER — KETOROLAC TROMETHAMINE 60 MG/2ML IM SOLN
60.0000 mg | Freq: Once | INTRAMUSCULAR | Status: AC
  Administered 2017-11-16: 60 mg via INTRAMUSCULAR

## 2017-11-16 MED ORDER — NALBUPHINE HCL 10 MG/ML IJ SOLN
10.0000 mg | Freq: Once | INTRAMUSCULAR | Status: AC
  Administered 2017-11-16: 10 mg via INTRAMUSCULAR
  Filled 2017-11-16: qty 1

## 2017-11-16 MED ORDER — LACTATED RINGERS IV SOLN
INTRAVENOUS | Status: DC
  Administered 2017-11-16: 15:00:00 via INTRAVENOUS

## 2017-11-16 MED ORDER — ONDANSETRON HCL 4 MG/2ML IJ SOLN
4.0000 mg | Freq: Once | INTRAMUSCULAR | Status: AC
  Administered 2017-11-16: 4 mg via INTRAVENOUS
  Filled 2017-11-16: qty 2

## 2017-11-16 NOTE — MAU Note (Signed)
Pt seen in MAU last Friday, dx'd with ovarian cyst, also seen in MD office this morning, sent to MAU for continued pain.  Pt states pain is severe, she has taken pain medication, last dose last night @ 2030, did not help.  Denies vaginal bleeding, has nausea & lightheadedness, denies vomiting or diarrhea.

## 2017-11-16 NOTE — Progress Notes (Signed)
RGYN patient presents for F/U visit following hospital visit for abdominal cramping on 11/09/17. Pt states today pain is still the same she cannot lay on her left side  Taking pain medication evevry 8 hrs 10/10 pain .  Pt had U/S done on12/14/18 and  11/12/17  2.3 cm involuting left corpus luteal cyst, grossly unchanged.

## 2017-11-16 NOTE — MAU Provider Note (Signed)
History     CSN: [id number]  Arrival date and time: 11/16/17 1000   First Provider Initiated Contact with Patient 11/16/17 1125      Chief Complaint  Patient presents with  . Pelvic Pain   Alexandra Gray GettingCierra R Gray is a 26 y.o G1P1001 who presents today with with worsening pelvic pain. She was seen in the ED on 11/09/17 and was treated for PID and trich, and a 2.4cm ovarian cyst was seen on US. She came here on 11/12/17 with continued pain, and US was unchanged. She saw her primary GYN this morning, and reports that the pain has significantly worsened. She states that the last time she took pain medication was last night. She reports that it is not helping so she stopped taking it. GYN sent her here for further evaluation.    Pelvic Pain  The patient's primary symptoms include pelvic pain. This is a new problem. The current episode started in the past 7 days. The problem occurs constantly. The problem has been gradually worsening. Pain severity now: 10/10. The problem affects the left side. She is not pregnant. Associated symptoms include nausea. Pertinent negatives include no chills, constipation (last BM 10/16/17 and reports it was normal), dysuria, fever or vomiting. The vaginal discharge was normal. There has been no bleeding. The symptoms are aggravated by activity. Treatments tried: last took oxycodone and tramadol around 2030 last night.  The treatment provided no relief. She uses nothing for contraception. Menstrual history: 10/16/17.    Past Medical History:  Diagnosis Date  . Chlamydia   . Depression    doing ok now  . Gonorrhea   . Hypertension    sometimes is up, never on meds  . Mental disorder   . Ovarian cyst   . PID (pelvic inflammatory disease)   . Sleep apnea     Past Surgical History:  Procedure Laterality Date  . CESAREAN SECTION N/A 10/03/2015   Procedure: CESAREAN SECTION;  Surgeon: Tilda BurrowJohn Ferguson V, MD;  Location: [facility];  Service: Obstetrics;  Laterality: N/A;   . KNEE SURGERY      Family History  Problem Relation Age of Onset  . Healthy Mother   . Healthy Father   . Healthy Brother   . Cancer Neg Hx   . Diabetes Neg Hx   . Heart disease Neg Hx   . Hypertension Neg Hx   . Stroke Neg Hx   . Hearing loss Neg Hx     Social History   Tobacco Use  . Smoking status: Never Smoker  . Smokeless tobacco: Never Used  Substance Use Topics  . Alcohol use: No  . Drug use: No    Allergies:  Allergies  Allergen Reactions  . Nubain [Nalbuphine Hcl]     Makes patient hot    Medications Prior to Admission  Medication Sig Dispense Refill Last Dose  . oxyCODONE-acetaminophen (PERCOCET/ROXICET) 5-325 MG tablet Take 1 tablet by mouth every 4 (four) hours as needed for severe pain. 10 tablet 0 11/15/2017 at Unknown time  . traMADol (ULTRAM) 50 MG tablet Take 1 tablet (50 mg total) by mouth every 6 (six) hours as needed. 20 tablet 0 11/15/2017 at Unknown time  . azithromycin (ZITHROMAX) 1 g powder Take 1 packet (1 g total) by mouth every 7 (seven) days for 2 doses. Take one pill today and 1 pill in 7 days. (Patient not taking: Reported on 11/16/2017) 2 each 0 Not Taking  . doxycycline (VIBRAMYCIN) 100 MG capsule Take 1  capsule (100 mg total) by mouth 2 (two) times daily. (Patient not taking: Reported on 11/16/2017) 28 capsule 0 Not Taking at Unknown time  . ibuprofen (ADVIL,MOTRIN) 600 MG tablet Take 1 tablet (600 mg total) by mouth every 6 (six) hours as needed for mild pain. (Patient not taking: Reported on 10/11/2017) 30 tablet 0 Not Taking  . naproxen (NAPROSYN) 375 MG tablet Take 1 tablet (375 mg total) by mouth 2 (two) times daily. (Patient not taking: Reported on 11/12/2017) 20 tablet 0 Not Taking  . Prenatal Multivit-Min-Fe-FA (PRENATAL VITAMINS) 0.8 MG tablet Take 1 tablet by mouth daily. (Patient not taking: Reported on 10/11/2017) 30 tablet 12 Not Taking  . sertraline (ZOLOFT) 50 MG tablet TAKE ONE TABLET BY MOUTH ONCE DAILY (Patient not  taking: Reported on 10/11/2017) 30 tablet 0 Not Taking  . topiramate (TOPAMAX) 25 MG tablet Take 1 tablet (25 mg total) daily by mouth. (Patient not taking: Reported on 11/12/2017) 30 tablet 5 Not Taking    Review of Systems  Constitutional: Negative for chills and fever.  Gastrointestinal: Positive for nausea. Negative for constipation (last BM 10/16/17 and reports it was normal) and vomiting.  Genitourinary: Positive for pelvic pain. Negative for dysuria.   Physical Exam   Blood pressure 125/75, pulse 95, temperature 97.6 F (36.4 C), temperature source Oral, resp. rate 20, height 5' (1.524 m), weight 297 lb (134.7 kg), last menstrual period 10/16/2017, currently breastfeeding.  Physical Exam  Nursing note and vitals reviewed. Constitutional: She is oriented to person, place, and time. She appears well-developed and well-nourished. No distress.  HENT:  Head: Normocephalic.  Cardiovascular: Normal rate.  Respiratory: Effort normal.  GI: Soft. There is no tenderness. There is no rebound.  Neurological: She is alert and oriented to person, place, and time.  Skin: Skin is warm and dry.  Psychiatric: She has a normal mood and affect.   Results for orders placed or performed during the hospital encounter of 11/16/17 (from the past 24 hour(s))  Urinalysis, Routine w reflex microscopic     Status: Abnormal   Collection Time: 11/16/17 10:25 AM  Result Value Ref Range   Color, Urine YELLOW YELLOW   APPearance HAZY (A) CLEAR   Specific Gravity, Urine 1.023 1.005 - 1.030   pH 5.0 5.0 - 8.0   Glucose, UA NEGATIVE NEGATIVE mg/dL   Hgb urine dipstick NEGATIVE NEGATIVE   Bilirubin Urine NEGATIVE NEGATIVE   Ketones, ur NEGATIVE NEGATIVE mg/dL   Protein, ur NEGATIVE NEGATIVE mg/dL   Nitrite NEGATIVE NEGATIVE   Leukocytes, UA NEGATIVE NEGATIVE  Pregnancy, urine POC     Status: None   Collection Time: 11/16/17 10:58 AM  Result Value Ref Range   Preg Test, Ur NEGATIVE NEGATIVE  CBC with  Differential/Platelet     Status: Abnormal   Collection Time: 11/16/17  2:08 PM  Result Value Ref Range   WBC 10.8 (H) 4.0 - 10.5 K/uL   RBC 4.95 3.87 - 5.11 MIL/uL   Hemoglobin 13.5 12.0 - 15.0 g/dL   HCT 16.140.7 09.636.0 - 04.546.0 %   MCV 82.2 78.0 - 100.0 fL   MCH 27.3 26.0 - 34.0 pg   MCHC 33.2 30.0 - 36.0 g/dL   RDW 40.914.3 81.111.5 - 91.415.5 %   Platelets 258 150 - 400 K/uL   Neutrophils Relative % 74 %   Neutro Abs 8.1 (H) 1.7 - 7.7 K/uL   Lymphocytes Relative 23 %   Lymphs Abs 2.4 0.7 - 4.0 K/uL   Monocytes Relative  2 %   Monocytes Absolute 0.2 0.1 - 1.0 K/uL   Eosinophils Relative 1 %   Eosinophils Absolute 0.1 0.0 - 0.7 K/uL   Basophils Relative 0 %   Basophils Absolute 0.0 0.0 - 0.1 K/uL  Comprehensive metabolic panel     Status: Abnormal   Collection Time: 11/16/17  2:08 PM  Result Value Ref Range   Sodium 138 135 - 145 mmol/L   Potassium 3.5 3.5 - 5.1 mmol/L   Chloride 103 101 - 111 mmol/L   CO2 24 22 - 32 mmol/L   Glucose, Bld 113 (H) 65 - 99 mg/dL   BUN 16 6 - 20 mg/dL   Creatinine, Ser 5.62 0.44 - 1.00 mg/dL   Calcium 8.5 (L) 8.9 - 10.3 mg/dL   Total Protein 7.5 6.5 - 8.1 g/dL   Albumin 3.7 3.5 - 5.0 g/dL   AST 13 (L) 15 - 41 U/L   ALT 15 14 - 54 U/L   Alkaline Phosphatase 76 38 - 126 U/L   Total Bilirubin 0.4 0.3 - 1.2 mg/dL   GFR calc non Af Amer >60 >60 mL/min   GFR calc Af Amer >60 >60 mL/min   Anion gap 11 5 - 15   US Pelvis Transvanginal Non-ob (tv Only)  Result Date: 11/16/2017 CLINICAL DATA:  Pelvic pain, left greater than right. Resolving cysts. LMP 10/16/2017. EXAM: ULTRASOUND PELVIS TRANSVAGINAL TECHNIQUE: Transvaginal ultrasound examination of the pelvis was performed including evaluation of the uterus, ovaries, adnexal regions, and pelvic cul-de-sac. COMPARISON:  11/12/2017, 11/09/2017 FINDINGS: Uterus Measurements: 6.7 x 3.8 x 4.3 cm. No fibroids or other mass visualized. Endometrium Thickness: 10.7 mm.  No focal abnormality visualized. Right ovary  Measurements: 3.7 x 2.6 x 2.7 cm. Normal appearance/no adnexal mass. Left ovary Measurements: 4.4 x 3.0 x 2.5 cm. Resolving left ovarian mass is stable and now measures 2.3 x 1.3 x 1.8 cm. Previously mass measured 2.3 x 2.1 x 1.7 cm. Other findings:  Small amount of free pelvic fluid. IMPRESSION: 1. Normal appearance of the uterus and right ovary. 2. Stable appearance of small left ovarian mass, 2.3 cm. Benign, resolving corpus luteum cyst is favored. Electronically Signed   By: Norva Pavlov M.D.   On: 11/16/2017 13:27   Ct Abdomen Pelvis W Contrast  Result Date: 11/16/2017 CLINICAL DATA:  Abdominal pain, left greater than right. EXAM: CT ABDOMEN AND PELVIS WITH CONTRAST TECHNIQUE: Multidetector CT imaging of the abdomen and pelvis was performed using the standard protocol following bolus administration of intravenous contrast. CONTRAST:  ISOVUE-300 IOPAMIDOL (ISOVUE-300) INJECTION 61% COMPARISON:  Pelvic ultrasound 11/16/2017. FINDINGS: Body habitus reduces diagnostic sensitivity and specificity. Lower chest: Unremarkable Hepatobiliary: Unremarkable Pancreas: Unremarkable Spleen: Unremarkable Adrenals/Urinary Tract: Unremarkable Stomach/Bowel: Unremarkable.  Appendix normal. Vascular/Lymphatic: Right common iliac node 0.8 cm in short axis, within normal limits. Right external iliac node 1.0 cm in short axis on image 61/2, borderline prominent. A central mesenteric lymph node measures 0.9 cm in short axis on image 39/2. No overtly pathologic adenopathy is identified. Reproductive: 1.4 by 1.9 cm or post luteum or small complex collapsed cyst of the left ovary, as shown on recent ultrasound. No significant adjacent free pelvic fluid. Uterus unremarkable. Other: No supplemental non-categorized findings. Musculoskeletal: Umbilical hernia contains adipose tissue the measures 3.2 by 2.0 by 2.1 cm. IMPRESSION: 1. An obvious cause for the patient's left-sided abdominal pain is not observed. 2. Borderline  prominent right external iliac lymph node, likely incidental. 3. 1.4 by 1.9 cm corpus luteum or  small complex collapsed cyst of the left ovary as shown on recent ultrasound. 4. Small umbilical hernia contains adipose tissue. Electronically Signed   By: Gaylyn Rong M.D.   On: 11/16/2017 16:09    MAU Course  Procedures  MDM  Patient has had nubain for pain, and zofran for nausea. She is resting more comfortably. IV fluids infusing.   DW patient that it is unlikely that such a small ovarian cyst would cause such intense pain. Ovarian cyst is stable at this time. I spoke with the [place] tech. She states that all pelvic US include blood flow to the ovaries, and if no blood flow had been detected it would have been reported on the [place] report. Reassured patient that [place] is stable, and CT scan doesn't show any other obvious causes of pain. Patient is distraught about this news. Advised her that she would need to FU with her PCP for further evaluation and management. She states that she still has pain medication that was previously prescribed at home.   Assessment and Plan   1. Left lower quadrant abdominal pain of unknown etiology   2. Pain    DC home Comfort measures reviewed  RX: none, continue previously prescribed medications  Return to MAU as needed FU with OB as planned  Follow-up Information    Fleet Contras, MD Follow up.   Specialty:  Internal Medicine Contact information: [address] [place] [address] [phone]            Thressa Sheller 11/16/2017, 4:20 PM

## 2017-11-16 NOTE — Progress Notes (Addendum)
Patient ID: Alexandra Gray, female   DOB: July 10, 1991, 26 y.o.   MRN: [id number]  Chief Complaint  Patient presents with  . Follow-up    HPI Alexandra GettingCierra R Coble is a 26 y.o. female.  Presents for follow up of pelvic pain.  [facility] for pelvic pain and U/S showed a 2.4 cm corpus luteum cyst.  Discharged home on analgesics and supportive measures.  [facility] for continued worsening pelvic pain, and repeat U/S showed an involuting 2.3 cm corpus luteum essentially unchanged.  She presents for follow up with increasing pelvic pain. HPI  Past Medical History:  Diagnosis Date  . Chlamydia   . Depression    doing ok now  . Gonorrhea   . Hypertension    sometimes is up, never on meds  . Mental disorder   . Ovarian cyst   . PID (pelvic inflammatory disease)   . Sleep apnea     Past Surgical History:  Procedure Laterality Date  . CESAREAN SECTION N/A 10/03/2015   Procedure: CESAREAN SECTION;  Surgeon: Tilda BurrowJohn Ferguson V, MD;  Location: [facility];  Service: Obstetrics;  Laterality: N/A;  . KNEE SURGERY      Family History  Problem Relation Age of Onset  . Healthy Mother   . Healthy Father   . Healthy Brother   . Cancer Neg Hx   . Diabetes Neg Hx   . Heart disease Neg Hx   . Hypertension Neg Hx   . Stroke Neg Hx   . Hearing loss Neg Hx     Social History Social History   Tobacco Use  . Smoking status: Never Smoker  . Smokeless tobacco: Never Used  Substance Use Topics  . Alcohol use: No  . Drug use: No    No Known Allergies  Current Outpatient Medications  Medication Sig Dispense Refill  . acetaminophen (TYLENOL) 325 MG tablet Take 325 mg by mouth every 6 (six) hours as needed for mild pain.    Marland Kitchen. doxycycline (VIBRAMYCIN) 100 MG capsule Take 1 capsule (100 mg total) by mouth 2 (two) times daily. 28 capsule 0  . oxyCODONE-acetaminophen (PERCOCET/ROXICET) 5-325 MG tablet Take 1 tablet by mouth every 4 (four) hours as needed for severe pain.  10 tablet 0  . azithromycin (ZITHROMAX) 1 g powder Take 1 packet (1 g total) by mouth every 7 (seven) days for 2 doses. Take one pill today and 1 pill in 7 days. (Patient not taking: Reported on 11/16/2017) 2 each 0  . ibuprofen (ADVIL,MOTRIN) 600 MG tablet Take 1 tablet (600 mg total) by mouth every 6 (six) hours as needed for mild pain. (Patient not taking: Reported on 10/11/2017) 30 tablet 0  . naproxen (NAPROSYN) 375 MG tablet Take 1 tablet (375 mg total) by mouth 2 (two) times daily. (Patient not taking: Reported on 11/12/2017) 20 tablet 0  . Prenatal Multivit-Min-Fe-FA (PRENATAL VITAMINS) 0.8 MG tablet Take 1 tablet by mouth daily. (Patient not taking: Reported on 10/11/2017) 30 tablet 12  . sertraline (ZOLOFT) 50 MG tablet TAKE ONE TABLET BY MOUTH ONCE DAILY (Patient not taking: Reported on 10/11/2017) 30 tablet 0  . topiramate (TOPAMAX) 25 MG tablet Take 1 tablet (25 mg total) daily by mouth. (Patient not taking: Reported on 11/12/2017) 30 tablet 5  . traMADol (ULTRAM) 50 MG tablet Take 1 tablet (50 mg total) by mouth every 6 (six) hours as needed. (Patient not taking: Reported on 11/16/2017) 20 tablet 0  Current Facility-Administered Medications  Medication Dose Route Frequency Provider Last Rate Last Dose  . ketorolac (TORADOL) injection 60 mg  60 mg Intramuscular Once Brock BadHarper, Charles A, MD        Review of Systems Review of Systems Constitutional: negative for fatigue and weight loss Respiratory: negative for cough and wheezing Cardiovascular: negative for chest pain, fatigue and palpitations Gastrointestinal: negative for abdominal pain and change in bowel habits Genitourinary:positive for pelvic pain Integument/breast: negative for nipple discharge Musculoskeletal:negative for myalgias Neurological: negative for gait problems and tremors Behavioral/Psych: negative for abusive relationship, depression Endocrine: negative for temperature intolerance      Blood pressure (!)  127/92, pulse 99, temperature 98.7 F (37.1 C), weight 297 lb (134.7 kg), last menstrual period 10/16/2017, currently breastfeeding.  Physical Exam Physical Exam:  Deferred  50% of 15 min visit spent on counseling and coordination of care.   Data Reviewed Ultrasounds:     US PELVIS TRANSVANGINAL NON-OB (TV ONLY) (Accession [id number]) (Order [id number])  Imaging  Date: 11/12/2017 [facility] ADMISSIONS Released By/Authorizing: Marylene LandKooistra, Kathryn Lorraine, CNM (auto-released)  Exam Information   Status Exam Begun  Exam Ended   Final [99] 11/12/2017 1:11 PM 11/12/2017 1:37 PM  PACS Images   Show images for US PELVIS TRANSVANGINAL NON-OB (TV ONLY)  Study Result   CLINICAL DATA:  Pelvic pain since Friday  EXAM: ULTRASOUND PELVIS TRANSVAGINAL  TECHNIQUE: Transvaginal ultrasound examination of the pelvis was performed including evaluation of the uterus, ovaries, adnexal regions, and pelvic cul-de-sac.  COMPARISON:  11/09/2017  FINDINGS: Uterus  Measurements: 7.2 x 3.5 x 4.4 cm. No fibroids or other mass visualized.  Endometrium  Thickness: 16 mm.  No focal abnormality visualized.  Right ovary  Measurements: 3.6 x 2.3 x 2.5 cm. 6 x 4.5 mm rim calcified lesion, possibly reflecting sequela of a prior hemorrhagic cyst.  Left ovary  Measurements: 4.0 x 2.9 x 2.7 cm. 2.3 x 2.1 x 1.7 cm involuting corpus luteal cyst.  Other findings:  Small volume pelvic fluid in the bilateral adnexa.  IMPRESSION: 2.3 cm involuting left corpus luteal cyst, grossly unchanged.   Electronically Signed   By: Charline BillsSriyesh  Krishnan M.D.   On: 11/12/2017 13:46    Assessment and Plan:    1. Pelvic pain, out of proportion to ultrasound findings Rx: - ketorolac (TORADOL) injection 60 mg given in the office - continue NSAIDS, oxycodone  and supportive measures  2. Corpus luteum cyst of left ovary - 2.3 cm - cyst involution on  ultrasound done on 11-12-17, with probable cyst rupture now and resultant increased pelvic pain  3. Class 3 severe obesity due to excess calories without serious comorbidity with body mass index (BMI) of 50.0 to 59.9 in adult [facility](HCC) - program of caloric reduction, exercise and behavioral modification recommended for gradual weight loss    Plan    Follow up prn  No orders of the defined types were placed in this encounter.  Meds ordered this encounter  Medications  . ketorolac (TORADOL) injection 60 mg

## 2017-11-16 NOTE — Discharge Instructions (Signed)

## 2018-01-29 ENCOUNTER — Emergency Department (HOSPITAL_COMMUNITY)
Admission: EM | Admit: 2018-01-29 | Discharge: 2018-01-29 | Disposition: A | Discharge status: Home / Self Care | Payer: Medicaid Other | Attending: Emergency Medicine | Admitting: Emergency Medicine

## 2018-01-29 ENCOUNTER — Encounter (HOSPITAL_COMMUNITY): Payer: Self-pay | Admitting: Emergency Medicine

## 2018-01-29 ENCOUNTER — Other Ambulatory Visit: Payer: Self-pay

## 2018-01-29 ENCOUNTER — Emergency Department (HOSPITAL_COMMUNITY): Payer: Medicaid Other

## 2018-01-29 DIAGNOSIS — S3992XA Unspecified injury of lower back, initial encounter: Secondary | ICD-10-CM | POA: Diagnosis present

## 2018-01-29 DIAGNOSIS — Y9289 Other specified places as the place of occurrence of the external cause: Secondary | ICD-10-CM | POA: Diagnosis not present

## 2018-01-29 DIAGNOSIS — Y9389 Activity, other specified: Secondary | ICD-10-CM | POA: Insufficient documentation

## 2018-01-29 DIAGNOSIS — I10 Essential (primary) hypertension: Secondary | ICD-10-CM | POA: Insufficient documentation

## 2018-01-29 DIAGNOSIS — Y990 Civilian activity done for income or pay: Secondary | ICD-10-CM | POA: Insufficient documentation

## 2018-01-29 DIAGNOSIS — S39012A Strain of muscle, fascia and tendon of lower back, initial encounter: Secondary | ICD-10-CM | POA: Insufficient documentation

## 2018-01-29 DIAGNOSIS — R1032 Left lower quadrant pain: Secondary | ICD-10-CM | POA: Diagnosis not present

## 2018-01-29 DIAGNOSIS — Z79899 Other long term (current) drug therapy: Secondary | ICD-10-CM | POA: Insufficient documentation

## 2018-01-29 DIAGNOSIS — X500XXA Overexertion from strenuous movement or load, initial encounter: Secondary | ICD-10-CM | POA: Insufficient documentation

## 2018-01-29 LAB — CBC
HEMATOCRIT: 43 % (ref 36.0–46.0)
HEMOGLOBIN: 13.9 g/dL (ref 12.0–15.0)
MCH: 27.3 pg (ref 26.0–34.0)
MCHC: 32.3 g/dL (ref 30.0–36.0)
MCV: 84.5 fL (ref 78.0–100.0)
Platelets: 265 10*3/uL (ref 150–400)
RBC: 5.09 MIL/uL (ref 3.87–5.11)
RDW: 13.8 % (ref 11.5–15.5)
WBC: 8.4 10*3/uL (ref 4.0–10.5)

## 2018-01-29 LAB — URINALYSIS, ROUTINE W REFLEX MICROSCOPIC
BILIRUBIN URINE: NEGATIVE
GLUCOSE, UA: NEGATIVE mg/dL
KETONES UR: NEGATIVE mg/dL
Nitrite: NEGATIVE
PROTEIN: NEGATIVE mg/dL
Specific Gravity, Urine: 1.017 (ref 1.005–1.030)
pH: 6 (ref 5.0–8.0)

## 2018-01-29 LAB — COMPREHENSIVE METABOLIC PANEL
ALBUMIN: 3.6 g/dL (ref 3.5–5.0)
ALK PHOS: 94 U/L (ref 38–126)
ALT: 17 U/L (ref 14–54)
ANION GAP: 10 (ref 5–15)
AST: 17 U/L (ref 15–41)
BUN: 11 mg/dL (ref 6–20)
CHLORIDE: 102 mmol/L (ref 101–111)
CO2: 26 mmol/L (ref 22–32)
Calcium: 9.1 mg/dL (ref 8.9–10.3)
Creatinine, Ser: 0.65 mg/dL (ref 0.44–1.00)
GFR calc Af Amer: >60 mL/min (ref >60)
GFR calc non Af Amer: >60 mL/min (ref >60)
GLUCOSE: 92 mg/dL (ref 65–99)
POTASSIUM: 3.7 mmol/L (ref 3.5–5.1)
SODIUM: 138 mmol/L (ref 135–145)
Total Bilirubin: 0.4 mg/dL (ref 0.3–1.2)
Total Protein: 7.2 g/dL (ref 6.5–8.1)

## 2018-01-29 LAB — I-STAT BETA HCG BLOOD, ED (MC, WL, AP ONLY): I-stat hCG, quantitative: <5 m[IU]/mL (ref <5)

## 2018-01-29 LAB — LIPASE, BLOOD: LIPASE: 26 U/L (ref 11–51)

## 2018-01-29 IMAGING — CT CT ABD-PELV W/ CM
2 of 4 series · 17 of 46 positions shown, 19 images · IV contrast (iopamidol)
Comparison: [DATE]

CLINICAL DATA: Back pain and left-sided abdominal pain 2 days after
lifting heavy object.

EXAM:
CT ABDOMEN AND PELVIS WITH CONTRAST
TECHNIQUE: Multidetector CT imaging of the abdomen and pelvis was performed
using the standard protocol following bolus administration of
intravenous contrast.
CONTRAST:  100mL [GX] IOPAMIDOL ([GX]) INJECTION 61%

[Series 3: a/p w/ 5mm · axial · 0.85mm/px · z∈[+705,+1110]mm · 14 of 89 slices shown, 16 images]
[im 4/89  soft-tissue]
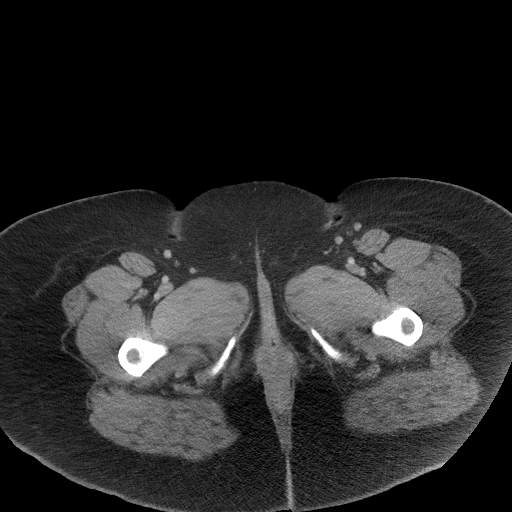
[im 4/89  bone]
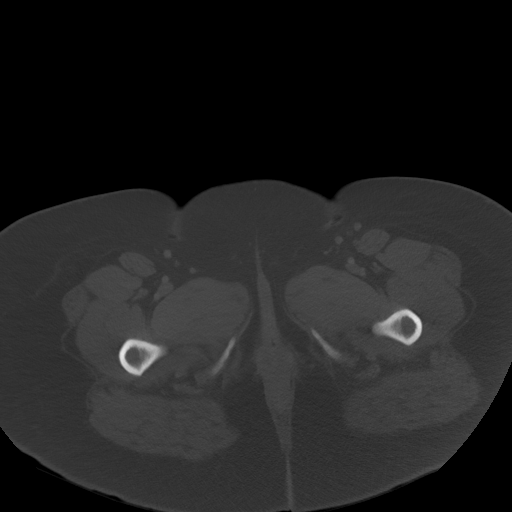
[im 12/89  soft-tissue]
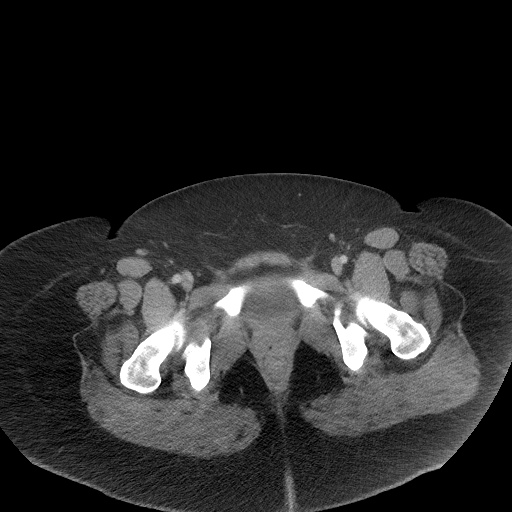
[im 19/89  soft-tissue]
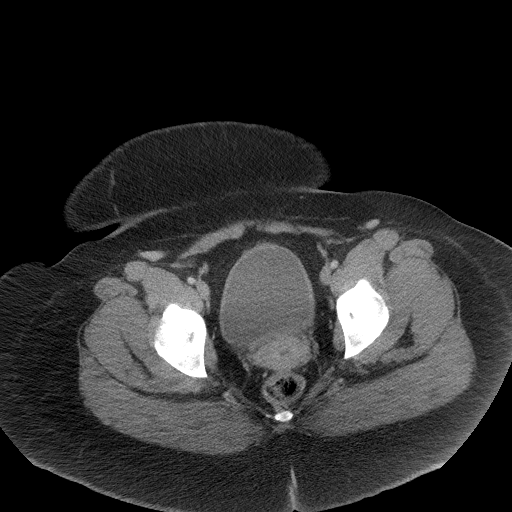
[im 23/89  soft-tissue]
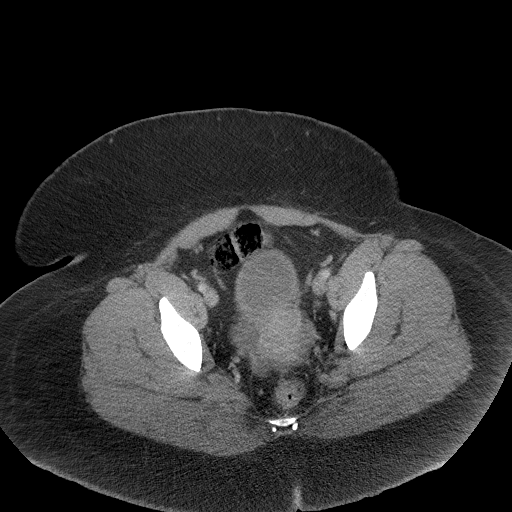
[im 30/89  soft-tissue]
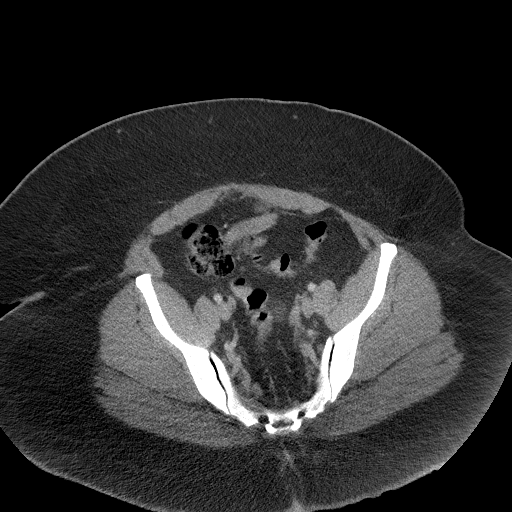
[im 37/89  soft-tissue]
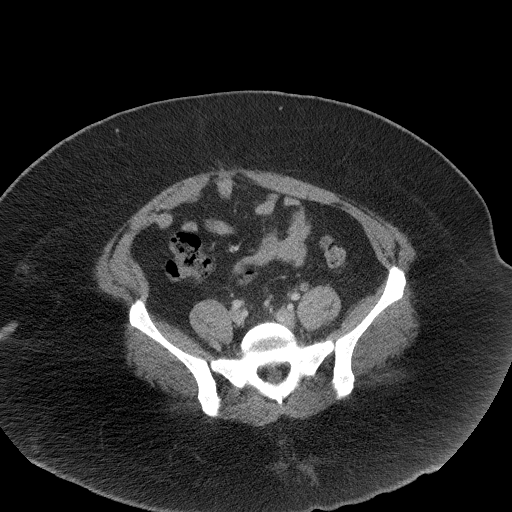
[im 41/89  soft-tissue]
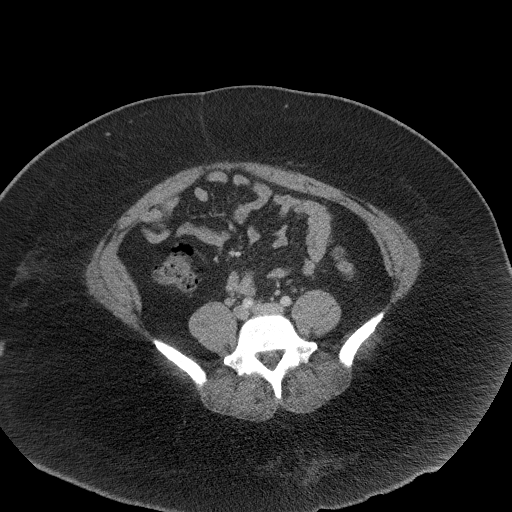
[im 48/89  soft-tissue]
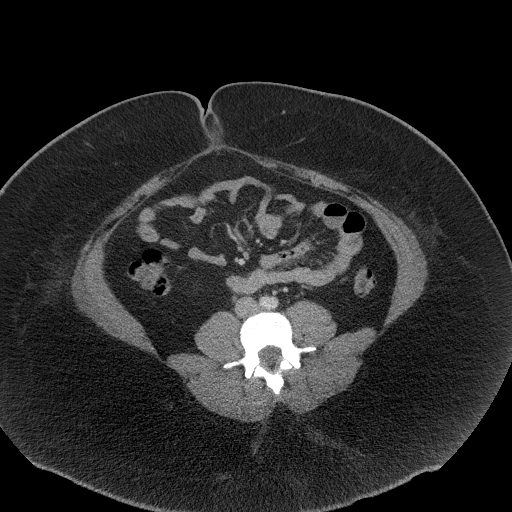
[im 52/89  soft-tissue]
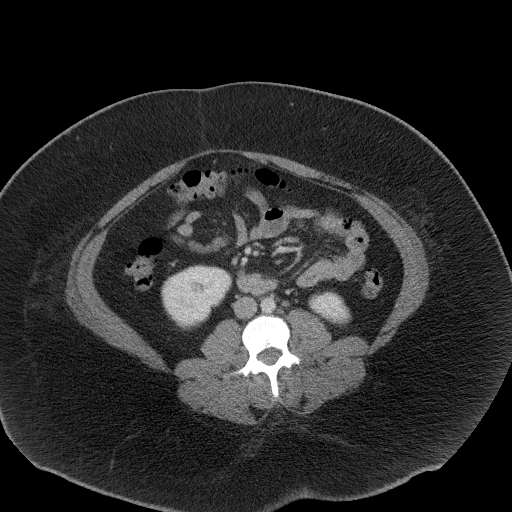
[im 52/89  bone]
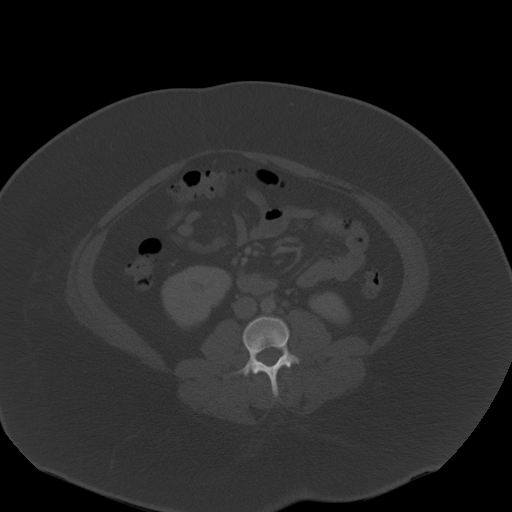
[im 59/89  soft-tissue]
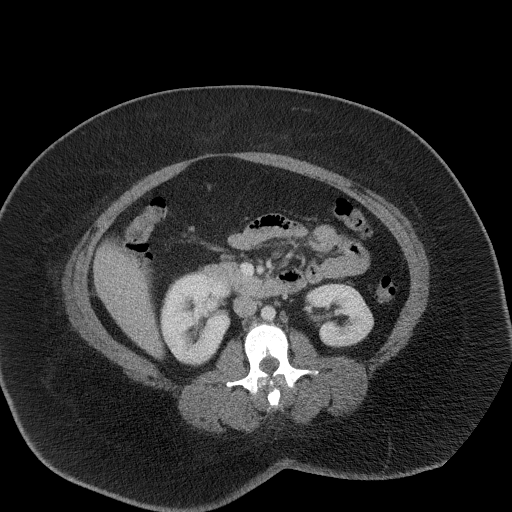
[im 67/89  soft-tissue]
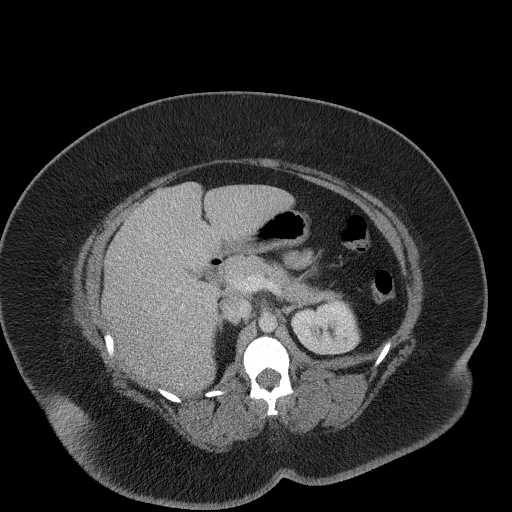
[im 70/89  soft-tissue]
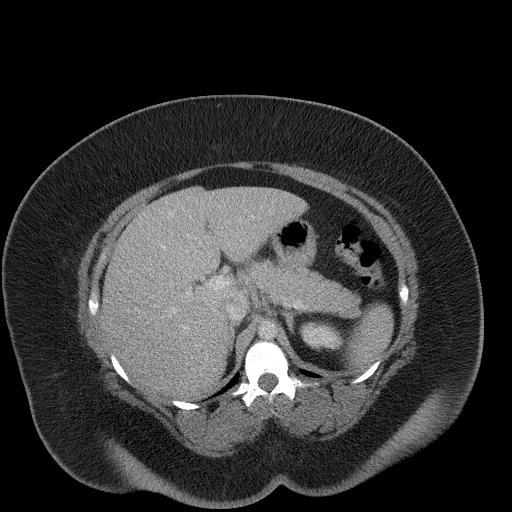
[im 78/89  soft-tissue]
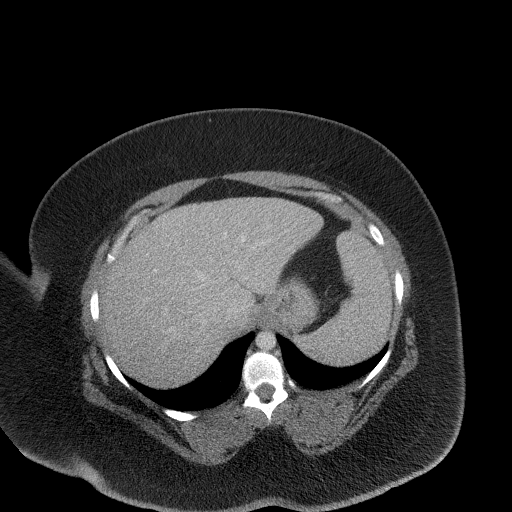
[im 85/89  soft-tissue]
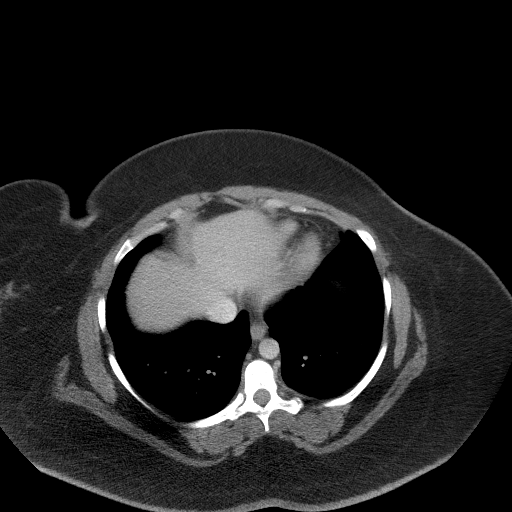

[Series 6: a/p w/ cor · coronal · 0.86mm/px · 3 of 189 slices shown]
[im 63/189  soft-tissue]
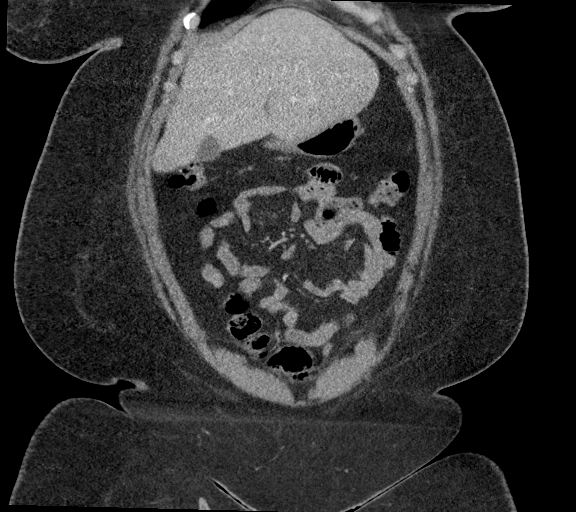
[im 84/189  soft-tissue]
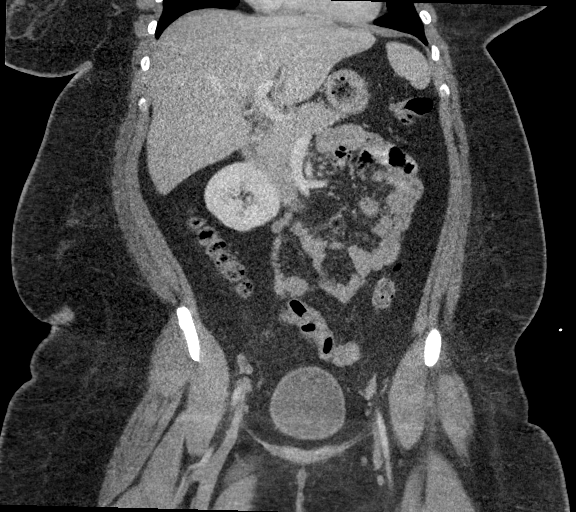
[im 105/189  soft-tissue]
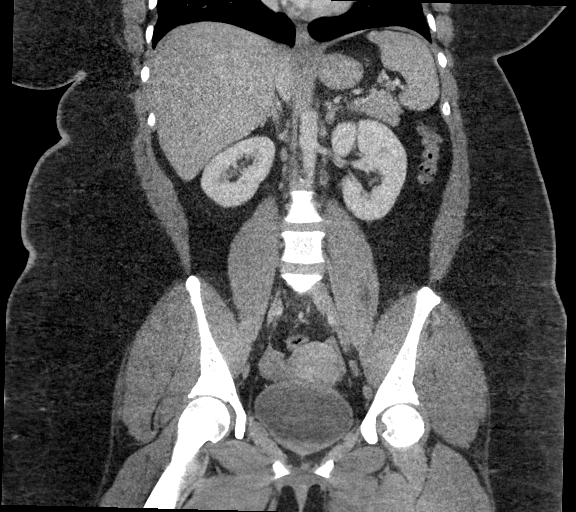

[17 of 46 positions shown; findings below may reference images not displayed]

FINDINGS: Lower chest: Lung bases are within normal.

Hepatobiliary: Normal.

Pancreas: Normal.

Spleen: Normal.

Adrenals/Urinary Tract: Adrenal glands are normal. Kidneys normal
size without hydronephrosis or nephrolithiasis. Ureters and bladder
are normal.

Stomach/Bowel: Stomach and small bowel are normal. Appendix is
normal and located just left of midline in the lower abdomen. Colon
is normal.

Vascular/Lymphatic: 9 mm right common iliac chain lymph node and
smaller right external iliac chain node without significant change.
1 cm right external iliac chain node and subcentimeter left external
iliac chain node unchanged. Vascular structures are within normal.

Reproductive: Normal.

Other: Tiny umbilical hernia containing only peritoneal fat
unchanged.

Musculoskeletal: Within normal.
IMPRESSION: No acute findings in the abdomen/pelvis.

Few small nonspecific iliac chain lymph nodes unchanged.

Tiny umbilical hernia containing only peritoneal fat unchanged.

## 2018-01-29 MED ORDER — IOPAMIDOL (ISOVUE-300) INJECTION 61%
100.0000 mL | Freq: Once | INTRAVENOUS | Status: AC | PRN
  Administered 2018-01-29: 100 mL via INTRAVENOUS

## 2018-01-29 MED ORDER — CYCLOBENZAPRINE HCL 10 MG PO TABS: 10.0000 mg | ORAL_TABLET | Freq: Two times a day (BID) | ORAL | 0 refills | Status: DC | PRN

## 2018-01-29 MED ORDER — NAPROXEN 500 MG PO TABS: 500.0000 mg | ORAL_TABLET | Freq: Two times a day (BID) | ORAL | 0 refills | Status: DC

## 2018-01-29 MED ORDER — KETOROLAC TROMETHAMINE 30 MG/ML IJ SOLN
30.0000 mg | Freq: Once | INTRAMUSCULAR | Status: AC
  Administered 2018-01-29: 30 mg via INTRAVENOUS
  Filled 2018-01-29: qty 1

## 2018-01-29 NOTE — Discharge Instructions (Signed)
Rest as much as possible.  Work note provided.  Take the Naprosyn on a regular basis.  Take the Flexeril as well.  Make an appointment to follow-up with your doctor.  Today's CT scan of the abdomen showed no internal abdominal problems including the female organs.

## 2018-01-29 NOTE — ED Triage Notes (Signed)
Back pain and left abd pain x 2 days states started after she lefted heavy objects at work, left side tender to touch has hx of ovaian cyst and hernia denies any injury to area except lifting

## 2018-01-29 NOTE — ED Provider Notes (Signed)
[facility] EMERGENCY DEPARTMENT Provider Note   CSN: [id number] Arrival date & time: 01/29/18  4098     History   Chief Complaint Chief Complaint  Patient presents with  . Abdominal Pain  . Back Pain    HPI Alexandra Gray is a 27 y.o. female.  Patient with complaint of back pain and left lower quadrant abdominal pain since Sunday.  No nausea vomiting or diarrhea no fevers.  No dysuria.  Patient's had difficulty with left lower quadrant pain in the past without any specific findings other than some concerns for ovarian cyst.  But other studies of shown resolution of that.  Patient did state that she lifted something heavy at work and that resulted in the left-sided back pain.  Patient denies any incontinence or radiation of the legs.      Past Medical History:  Diagnosis Date  . Chlamydia   . Depression    doing ok now  . Gonorrhea   . Hypertension    sometimes is up, never on meds  . Mental disorder   . Ovarian cyst   . PID (pelvic inflammatory disease)   . Sleep apnea     Patient Active Problem List   Diagnosis Date Noted  . History of pregnancy induced hypertension 12/06/2016  . History of trichomonal vaginitis 12/06/2016  . S/P primary low transverse C-section 10/03/2015  . 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 Suicidal ideation 08/04/2013    Past Surgical History:  Procedure Laterality Date  . CESAREAN SECTION N/A 10/03/2015   Procedure: CESAREAN SECTION;  Surgeon: Tilda Burrow, MD;  Location: [facility];  Service: Obstetrics;  Laterality: N/A;  . KNEE SURGERY      OB History    Gravida Para Term Preterm AB Living   1 1 1     1    SAB TAB Ectopic Multiple Live Births         0 1       Home Medications    Prior to Admission medications   Medication Sig Start Date End Date Taking? Authorizing Provider  cyclobenzaprine (FLEXERIL) 10 MG tablet Take 1 tablet (10 mg total) by mouth  2 (two) times daily as needed for muscle spasms. 01/29/18   Vanetta Mulders, MD  doxycycline (VIBRAMYCIN) 100 MG capsule Take 1 capsule (100 mg total) by mouth 2 (two) times daily. Patient not taking: Reported on 11/16/2017 11/09/17   Demetrios Loll T, PA-C  ibuprofen (ADVIL,MOTRIN) 600 MG tablet Take 1 tablet (600 mg total) by mouth every 6 (six) hours as needed for mild pain. Patient not taking: Reported on 10/11/2017 10/06/15   Casey Burkitt, MD  naproxen (NAPROSYN) 375 MG tablet Take 1 tablet (375 mg total) by mouth 2 (two) times daily. Patient not taking: Reported on 11/12/2017 11/09/17   Demetrios Loll T, PA-C  naproxen (NAPROSYN) 500 MG tablet Take 1 tablet (500 mg total) by mouth 2 (two) times daily. 01/29/18   Vanetta Mulders, MD  oxyCODONE-acetaminophen (PERCOCET/ROXICET) 5-325 MG tablet Take 1 tablet by mouth every 4 (four) hours as needed for severe pain. 11/12/17   Marylene Land, CNM  Prenatal Multivit-Min-Fe-FA (PRENATAL VITAMINS) 0.8 MG tablet Take 1 tablet by mouth daily. Patient not taking: Reported on 10/11/2017 02/12/15   Clemmons, Lawson Fiscal A, CNM  sertraline (ZOLOFT) 50 MG tablet TAKE ONE TABLET BY MOUTH ONCE DAILY Patient not taking: Reported on 10/11/2017 05/08/16   Anyanwu, Jethro Bastos, MD  topiramate (  TOPAMAX) 25 MG tablet Take 1 tablet (25 mg total) daily by mouth. Patient not taking: Reported on 11/12/2017 10/12/17   Nita SicklePatel, Donika K, DO  traMADol (ULTRAM) 50 MG tablet Take 1 tablet (50 mg total) by mouth every 6 (six) hours as needed. 11/12/17   Marylene LandKooistra, Kathryn Lorraine, CNM    Family History Family History  Problem Relation Age of Onset  . Healthy Mother   . Healthy Father   . Healthy Brother   . Cancer Neg Hx   . Diabetes Neg Hx   . Heart disease Neg Hx   . Hypertension Neg Hx   . Stroke Neg Hx   . Hearing loss Neg Hx     Social History Social History   Tobacco Use  . Smoking status: Never Smoker  . Smokeless tobacco: Never Used    Substance Use Topics  . Alcohol use: No  . Drug use: No     Allergies   Nubain [nalbuphine hcl]   Review of Systems Review of Systems  Constitutional: Negative for fever.  HENT: Negative for congestion.   Eyes: Negative for redness.  Respiratory: Negative for shortness of breath.   Cardiovascular: Negative for chest pain.  Gastrointestinal: Positive for abdominal pain. Negative for diarrhea, nausea and vomiting.  Genitourinary: Negative for dysuria.  Musculoskeletal: Positive for back pain.  Neurological: Negative for weakness and numbness.  Hematological: Does not bruise/bleed easily.  Psychiatric/Behavioral: Negative for confusion.     Physical Exam Updated Vital Signs BP (!) 141/98   Pulse 86   Temp 98.4 F (36.9 C) (Oral)   Resp 17   Ht 1.524 m (5')   Wt 114.3 kg (252 lb)   SpO2 96%   BMI 49.22 kg/m   Physical Exam  Constitutional: She is oriented to person, place, and time. She appears well-developed and well-nourished. No distress.  HENT:  Head: Normocephalic and atraumatic.  Eyes: Conjunctivae and EOM are normal. Pupils are equal, round, and reactive to light.  Cardiovascular: Normal rate, regular rhythm and normal heart sounds.  Pulmonary/Chest: Effort normal and breath sounds normal.  Abdominal: Soft. Bowel sounds are normal. There is no tenderness. There is no guarding.  Musculoskeletal: She exhibits tenderness.  Patient with tenderness to palpation mid line lumbar area.  No spasm.  No obvious deformity.  Neurological: She is alert and oriented to person, place, and time. No cranial nerve deficit. She exhibits normal muscle tone. Coordination normal.  Skin: Skin is warm.  Nursing note and vitals reviewed.    ED Treatments / Results  Labs (all labs ordered are listed, but only abnormal results are displayed) Labs Reviewed  URINALYSIS, ROUTINE W REFLEX MICROSCOPIC - Abnormal; Notable for the following components:      Result Value   APPearance  CLOUDY (*)    Hgb urine dipstick LARGE (*)    Leukocytes, UA SMALL (*)    Bacteria, UA FEW (*)    Squamous Epithelial / LPF TOO NUMEROUS TO COUNT (*)    All other components within normal limits  LIPASE, BLOOD  COMPREHENSIVE METABOLIC PANEL  CBC  I-STAT BETA HCG BLOOD, ED ([facility], [facility], [facility] ONLY)    EKG  EKG Interpretation None       Radiology Ct Abdomen Pelvis W Contrast  Result Date: 01/29/2018 CLINICAL DATA:  Back pain and left-sided abdominal pain 2 days after lifting heavy object. EXAM: CT ABDOMEN AND PELVIS WITH CONTRAST TECHNIQUE: Multidetector CT imaging of the abdomen and pelvis was performed using the standard protocol  following bolus administration of intravenous contrast. CONTRAST:  ISOVUE-300 IOPAMIDOL (ISOVUE-300) INJECTION 61% COMPARISON:  11/16/2017 FINDINGS: Lower chest: Lung bases are within normal. Hepatobiliary: Normal. Pancreas: Normal. Spleen: Normal. Adrenals/Urinary Tract: Adrenal glands are normal. Kidneys normal size without hydronephrosis or nephrolithiasis. Ureters and bladder are normal. Stomach/Bowel: Stomach and small bowel are normal. Appendix is normal and located just left of midline in the lower abdomen. Colon is normal. Vascular/Lymphatic: 9 mm right common iliac chain lymph node and smaller right external iliac chain node without significant change. 1 cm right external iliac chain node and subcentimeter left external iliac chain node unchanged. Vascular structures are within normal. Reproductive: Normal. Other: Tiny umbilical hernia containing only peritoneal fat unchanged. Musculoskeletal: Within normal. IMPRESSION: No acute findings in the abdomen/pelvis. Few small nonspecific iliac chain lymph nodes unchanged. Tiny umbilical hernia containing only peritoneal fat unchanged. Electronically Signed   By: Elberta Fortis M.D.   On: 01/29/2018 10:04    Procedures Procedures (including critical care time)  Medications Ordered in ED Medications  ketorolac  (TORADOL) 30 MG/ML injection 30 mg (not administered)  iopamidol (ISOVUE-300) 61 % injection 100 mL (100 mLs Intravenous Contrast Given 01/29/18 0940)     Initial Impression / Assessment and Plan / ED Course  I have reviewed the triage vital signs and the nursing notes.  Pertinent labs & imaging results that were available during my care of the patient were reviewed by me and considered in my medical decision making (see chart for details).     Patient's workup without any acute findings in the intra-abdominal area.  Labs without specific abnormalities.  Urinalysis negative pregnancy test negative.  Symptoms probably most consistent with lumbar back pain.  Will treat symptomatically.  Clinically not concerned about an acute abdominal process.  In addition patient's ovary on the left side is normal.  Final Clinical Impressions(s) / ED Diagnoses   Final diagnoses:  Strain of lumbar region, initial encounter    ED Discharge Orders        Ordered    naproxen (NAPROSYN) 500 MG tablet  2 times daily     01/29/18 1019    cyclobenzaprine (FLEXERIL) 10 MG tablet  2 times daily PRN     01/29/18 1019       Vanetta Mulders, MD 01/29/18 1026

## 2018-02-15 ENCOUNTER — Ambulatory Visit: Payer: Medicaid Other | Admitting: Neurology

## 2018-05-11 ENCOUNTER — Emergency Department (HOSPITAL_COMMUNITY)
Admission: EM | Admit: 2018-05-11 | Discharge: 2018-05-11 | Disposition: A | Discharge status: Home / Self Care | Payer: Medicaid Other | Attending: Emergency Medicine | Admitting: Emergency Medicine

## 2018-05-11 ENCOUNTER — Encounter (HOSPITAL_COMMUNITY): Payer: Self-pay | Admitting: Emergency Medicine

## 2018-05-11 ENCOUNTER — Other Ambulatory Visit: Payer: Self-pay

## 2018-05-11 DIAGNOSIS — M545 Low back pain, unspecified: Secondary | ICD-10-CM

## 2018-05-11 DIAGNOSIS — G8929 Other chronic pain: Secondary | ICD-10-CM

## 2018-05-11 DIAGNOSIS — I10 Essential (primary) hypertension: Secondary | ICD-10-CM | POA: Diagnosis not present

## 2018-05-11 MED ORDER — NAPROXEN 375 MG PO TABS: 375.0000 mg | ORAL_TABLET | Freq: Two times a day (BID) | ORAL | 0 refills | Status: DC

## 2018-05-11 MED ORDER — CYCLOBENZAPRINE HCL 10 MG PO TABS: 10.0000 mg | ORAL_TABLET | Freq: Two times a day (BID) | ORAL | 0 refills | Status: DC | PRN

## 2018-05-11 NOTE — ED Triage Notes (Signed)
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 Pain worse with any movement. Ambulatory with steady gait.

## 2018-05-11 NOTE — ED Provider Notes (Signed)
[facility] Provider Note   CSN: [id number] Arrival date & time: 05/11/18  [id number]     History   Chief Complaint Chief Complaint  Patient presents with  . Back Pain    HPI [name] is a 27 y.o. female with pmh below here for evaluation of back pain. Ongoing for 2-3 months but acutely worsening in last 2 day. Pain is moderate. Pain is located to middle back bilaterally equal. Pain worsened after she picked up heavy tray of silver ware at work. Aggravating factors include laying flat and bending forward. Has tried tylenol without relief. Slightly alleviated by sitting up. She denies any fevers, chills, abdominal pain, dysuria, hematuria, urinary frequency, urgency, changes in BM, saddle anesthesia, bladder incontinence or retention, numbness or loss of sensation to extremities. No recent trauma or falls. No known h/o of injury or surgeries to back.  HPI  Past Medical History:  Diagnosis Date  . Chlamydia   . Depression    doing ok now  . Gonorrhea   . Hypertension    sometimes is up, never on meds  . Mental disorder   . Ovarian cyst   . PID (pelvic inflammatory disease)   . Sleep apnea     Patient Active Problem List   Diagnosis Date Noted  . History of pregnancy induced hypertension 12/06/2016  . History of trichomonal vaginitis 12/06/2016  . S/P primary low transverse C-section 10/03/2015  . 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 Suicidal ideation 08/04/2013    Past Surgical History:  Procedure Laterality Date  . CESAREAN SECTION N/A 10/03/2015   Procedure: CESAREAN SECTION;  Surgeon: Tilda BurrowJohn Ferguson V, MD;  Location: [facility];  Service: Obstetrics;  Laterality: N/A;  . KNEE SURGERY       OB History    Gravida  1   Para  1   Term  1   Preterm      AB      Living  1     SAB      TAB      Ectopic      Multiple  0   Live Births  1            Home  Medications    Prior to Admission medications   Medication Sig Start Date End Date Taking? Authorizing Provider  cyclobenzaprine (FLEXERIL) 10 MG tablet Take 1 tablet (10 mg total) by mouth 2 (two) times daily as needed for muscle spasms. 05/11/18   Liberty HandyGibbons, Rito Lecomte J, PA-C  doxycycline (VIBRAMYCIN) 100 MG capsule Take 1 capsule (100 mg total) by mouth 2 (two) times daily. Patient not taking: Reported on 11/16/2017 11/09/17   Demetrios LollLeaphart, Kenneth T, PA-C  ibuprofen (ADVIL,MOTRIN) 600 MG tablet Take 1 tablet (600 mg total) by mouth every 6 (six) hours as needed for mild pain. Patient not taking: Reported on 10/11/2017 10/06/15   Casey BurkittFitzgerald, Hillary Moen, MD  naproxen (NAPROSYN) 375 MG tablet Take 1 tablet (375 mg total) by mouth 2 (two) times daily. 05/11/18   Liberty HandyGibbons, Dariyah Garduno J, PA-C  oxyCODONE-acetaminophen (PERCOCET/ROXICET) 5-325 MG tablet Take 1 tablet by mouth every 4 (four) hours as needed for severe pain. 11/12/17   Marylene LandKooistra, Kathryn Lorraine, CNM  Prenatal Multivit-Min-Fe-FA (PRENATAL VITAMINS) 0.8 MG tablet Take 1 tablet by mouth daily. Patient not taking: Reported on 10/11/2017 02/12/15   Clemmons, Lawson FiscalLori A, CNM  sertraline (ZOLOFT) 50 MG tablet TAKE ONE TABLET BY MOUTH ONCE DAILY  Patient not taking: Reported on 10/11/2017 05/08/16   Tereso Newcomer, MD  topiramate (TOPAMAX) 25 MG tablet Take 1 tablet (25 mg total) daily by mouth. Patient not taking: Reported on 11/12/2017 10/12/17   Nita Sickle K, DO  traMADol (ULTRAM) 50 MG tablet Take 1 tablet (50 mg total) by mouth every 6 (six) hours as needed. 11/12/17   Marylene Land, CNM    Family History Family History  Problem Relation Age of Onset  . Healthy Mother   . Healthy Father   . Healthy Brother   . Cancer Neg Hx   . Diabetes Neg Hx   . Heart disease Neg Hx   . Hypertension Neg Hx   . Stroke Neg Hx   . Hearing loss Neg Hx     Social History Social History   Tobacco Use  . Smoking status: Never Smoker  .  Smokeless tobacco: Never Used  Substance Use Topics  . Alcohol use: No  . Drug use: No     Allergies   Nubain [nalbuphine hcl]   Review of Systems Review of Systems  Musculoskeletal: Positive for back pain and myalgias.  All other systems reviewed and are negative.    Physical Exam Updated Vital Signs BP (!) 133/94 (BP Location: Right Arm)   Pulse 94   Temp 99.2 F (37.3 C) (Oral)   Resp 16   Ht 5\' 4"  (1.626 m)   Wt 132 kg (291 lb)   LMP 05/10/2018 (Exact Date)   SpO2 100%   BMI 49.95 kg/m   Physical Exam  Constitutional: She is oriented to person, place, and time. She appears well-developed and well-nourished. No distress.  HENT:  Head: Normocephalic and atraumatic.  Nose: Nose normal.  Eyes: EOM are normal.  Cardiovascular: Normal rate, S1 normal, S2 normal and normal heart sounds.  Pulses:      Radial pulses are 2+ on the right side, and 2+ on the left side.       Dorsalis pedis pulses are 2+ on the right side, and 2+ on the left side.  Pulmonary/Chest: Effort normal and breath sounds normal. She has no decreased breath sounds.  Abdominal: Soft. Normal appearance and bowel sounds are normal. There is no tenderness.  No suprapubic or CVA tenderness   Musculoskeletal: She exhibits tenderness.       Lumbar back: She exhibits tenderness and pain.  No TL spine midline tenderness No T spine paraspinal muscle tenderness  +Mild bilateral L spine paraspinal muscular tenderness equal bilaterally  SI joints and sciatic notches non tender, bilaterally  Full PROM hip bilaterally without reported pain Negative SLR. Negative Faber.   Neurological: She is alert and oriented to person, place, and time.  5/5 strength with flexion/extension of hip, knee and ankle, bilaterally.  Sensation to light touch intact in lower extremities including feet  Skin: Skin is warm and dry. Capillary refill takes less than 2 seconds.  Psychiatric: She has a normal mood and affect. Her speech  is normal and behavior is normal. Judgment and thought content normal. Cognition and memory are normal.  Nursing note and vitals reviewed.    ED Treatments / Results  Labs (all labs ordered are listed, but only abnormal results are displayed) Labs Reviewed - No data to display  EKG None  Radiology No results found.  Procedures Procedures (including critical care time)  Medications Ordered in ED Medications - No data to display   Initial Impression / Assessment and Plan / ED Course  I have reviewed the triage vital signs and the nursing notes.  Pertinent labs & imaging results that were available during my care of the patient were reviewed by me and considered in my medical decision making (see chart for details).    Patient is a 27 y.o. female here with back pain.  Ongoing for 2-3 months, acutely worsened 2 days ago. Known repetitive bending/lifting at work prior to exacerbation of symptoms.  On exam pt has VSS, abdominal exam reassuring without suprapubic or CVAT. Distal pulses symmetric bilaterally.  Musculoskeletal exam revealed bilateral muscular tenderness at L spine.  No focal neurological deficits appreciated. Patient is ambulatory. Considered ruptured disc, UTI/pyelo, PID, kidney stone, cauda equina or epidural abscess however these don't fit clinical picture. Most likely lumbar strain vs spasm.  No red flag symptoms of back pain including: bladder/bowel incontinence or retention, night sweats, night pain, fevers or weight loss, h/o cancer, IVDU, recent trauma or falls. Labs and Imaging not thought to be indicated today as physical exam reassuring. Suspect muscular/soft tissue etiology.  Conservative measures such as ice/heat, mild stretches, muscle relaxant and high dose NSAIDs indicated with PCP follow-up if no improvement with conservative management. ED return precautions discussed with patient who verbalized understanding and is agreeable to plan.    Final Clinical  Impressions(s) / ED Diagnoses   Final diagnoses:  Chronic bilateral low back pain without sciatica    ED Discharge Orders        Ordered    cyclobenzaprine (FLEXERIL) 10 MG tablet  2 times daily PRN     05/11/18 1146    naproxen (NAPROSYN) 375 MG tablet  2 times daily     05/11/18 1146       Jerrell Mylar 05/11/18 1238    Cathren Laine, MD 05/11/18 1302

## 2018-05-11 NOTE — Discharge Instructions (Signed)
You were seen in the ER for chronic back pain.   Your pain is likely from muscular soreness and tightness after repeated lifting at work.  Take 1000 mg acetaminophen (tylenol)  every 8 hours for muscular pain. Additionally you can take naproxen 375 mg every 12 hours. Flexeril 10 mg every 12 hours for muscle spasms and tightness. Rest for the next 2-3 days to avoid further muscle inflammation and soreness. After 2-3 days you can start doing light stretches and range of motion exercises. Heating pad and massage will also help.   Follow up with your primary care doctor if symptoms persist and do not improve after 7 days.   Return to ED if you develop fevers, chills, groin numbness, abdominal pain, vomiting, urinary symptoms, constipation, loss of sensation or weakness of extremities

## 2018-05-28 ENCOUNTER — Inpatient Hospital Stay (HOSPITAL_COMMUNITY)
Admission: AD | Admit: 2018-05-28 | Discharge: 2018-05-28 | Disposition: A | Discharge status: Home / Self Care | Payer: Medicaid Other | Source: Ambulatory Visit | Attending: Obstetrics and Gynecology | Admitting: Obstetrics and Gynecology

## 2018-05-28 ENCOUNTER — Other Ambulatory Visit: Payer: Self-pay

## 2018-05-28 ENCOUNTER — Encounter (HOSPITAL_COMMUNITY): Payer: Self-pay | Admitting: *Deleted

## 2018-05-28 ENCOUNTER — Inpatient Hospital Stay (HOSPITAL_COMMUNITY): Payer: Medicaid Other

## 2018-05-28 DIAGNOSIS — Z3A01 Less than 8 weeks gestation of pregnancy: Secondary | ICD-10-CM | POA: Insufficient documentation

## 2018-05-28 DIAGNOSIS — N926 Irregular menstruation, unspecified: Secondary | ICD-10-CM

## 2018-05-28 DIAGNOSIS — Z6841 Body Mass Index (BMI) 40.0 and over, adult: Secondary | ICD-10-CM

## 2018-05-28 DIAGNOSIS — O210 Mild hyperemesis gravidarum: Secondary | ICD-10-CM | POA: Diagnosis not present

## 2018-05-28 LAB — CBC
HCT: 38.5 % (ref 36.0–46.0)
HEMOGLOBIN: 12.7 g/dL (ref 12.0–15.0)
MCH: 27.6 pg (ref 26.0–34.0)
MCHC: 33 g/dL (ref 30.0–36.0)
MCV: 83.7 fL (ref 78.0–100.0)
Platelets: 274 10*3/uL (ref 150–400)
RBC: 4.6 MIL/uL (ref 3.87–5.11)
RDW: 14.6 % (ref 11.5–15.5)
WBC: 10.2 10*3/uL (ref 4.0–10.5)

## 2018-05-28 LAB — URINALYSIS, ROUTINE W REFLEX MICROSCOPIC
Bilirubin Urine: NEGATIVE
Glucose, UA: NEGATIVE mg/dL
Hgb urine dipstick: NEGATIVE
Ketones, ur: NEGATIVE mg/dL
LEUKOCYTES UA: NEGATIVE
NITRITE: NEGATIVE
PROTEIN: NEGATIVE mg/dL
Specific Gravity, Urine: 1.021 (ref 1.005–1.030)
pH: 7 (ref 5.0–8.0)

## 2018-05-28 LAB — HCG, QUANTITATIVE, PREGNANCY: HCG, BETA CHAIN, QUANT, S: 41127 m[IU]/mL — AB (ref <5)

## 2018-05-28 LAB — POCT PREGNANCY, URINE: PREG TEST UR: POSITIVE — AB

## 2018-05-28 IMAGING — US US OB COMP LESS 14 WK
1 series · 15 of 28 positions shown · non-contrast
Comparison: None.

CLINICAL DATA: Unsure of LMP.  Irregular menses.

EXAM:
OBSTETRIC <14 WK US AND TRANSVAGINAL OB US
TECHNIQUE: Both transabdominal and transvaginal ultrasound examinations were
performed for complete evaluation of the gestation as well as the
maternal uterus, adnexal regions, and pelvic cul-de-sac.
Transvaginal technique was performed to assess early pregnancy.

[Series 1: us ob comp less 14 wk · 66 acquisitions, 15 frames shown]
[im 1/66]
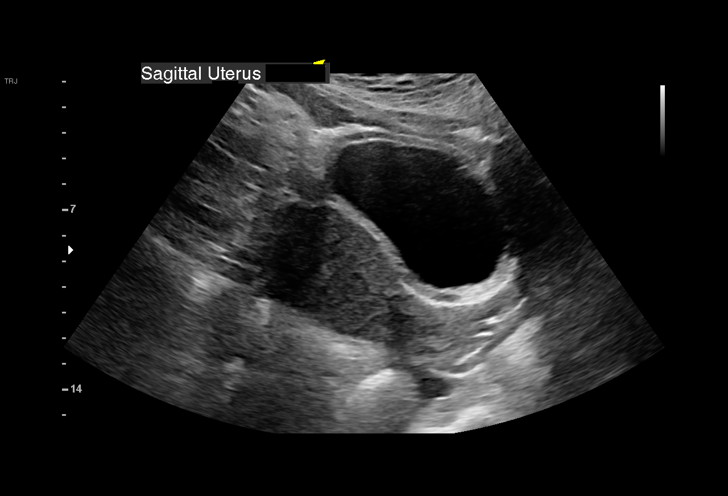
[im 5/66]
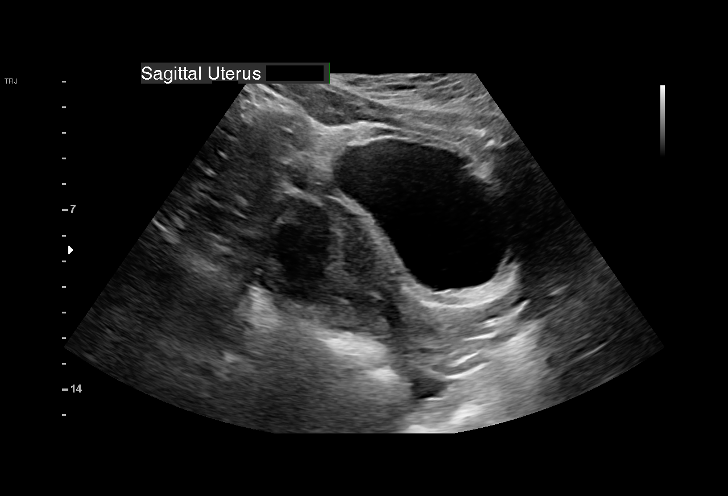
[im 10/66]
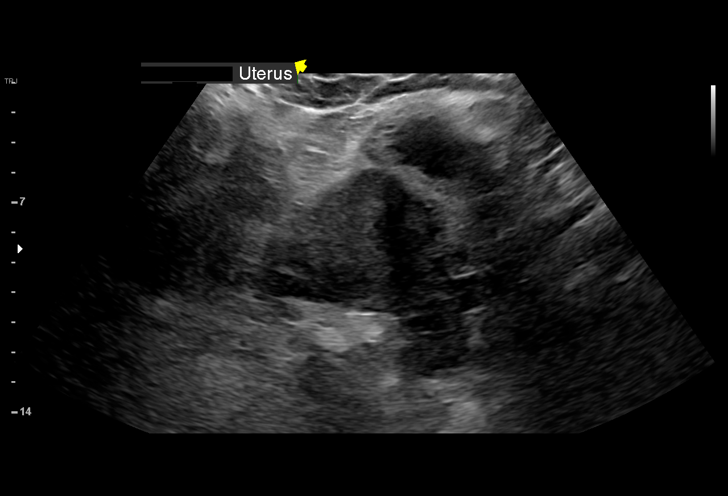
[im 15/66]
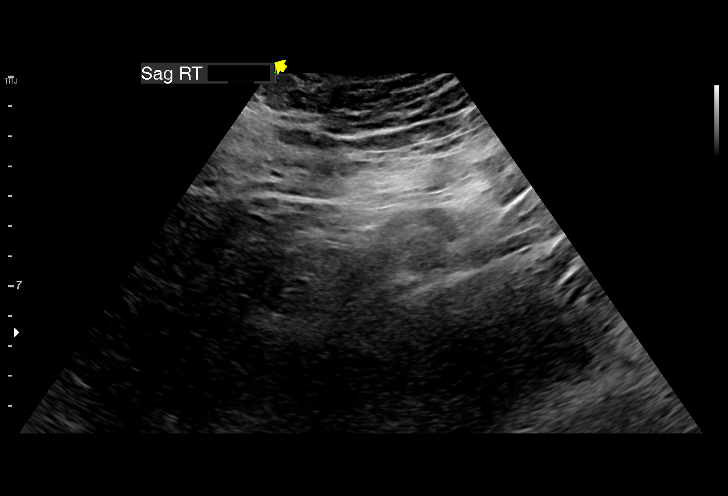
[im 20/66]
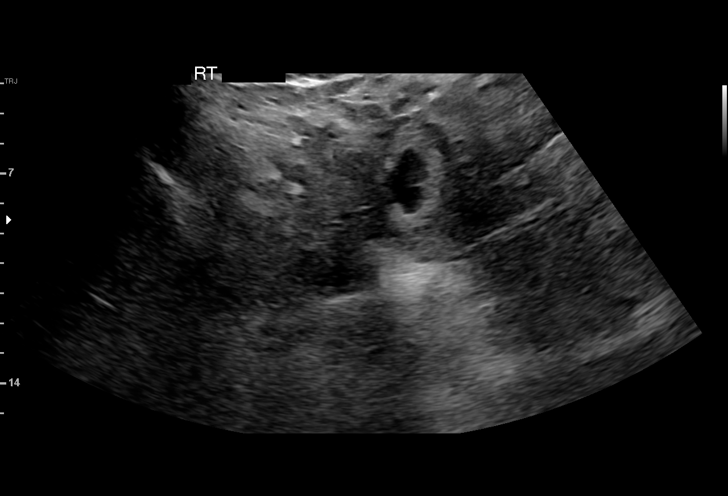
[im 25/66]
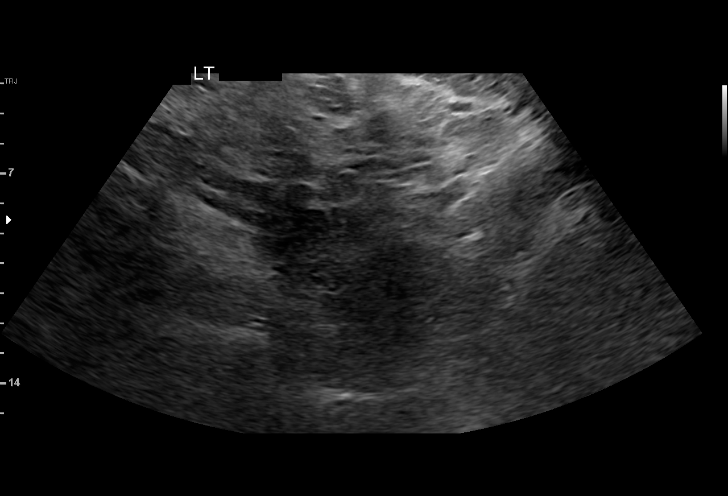
[im 29/66]
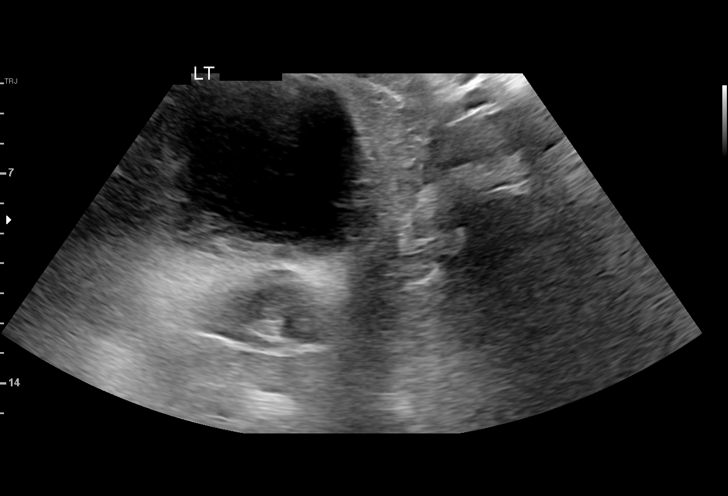
[im 34/66]
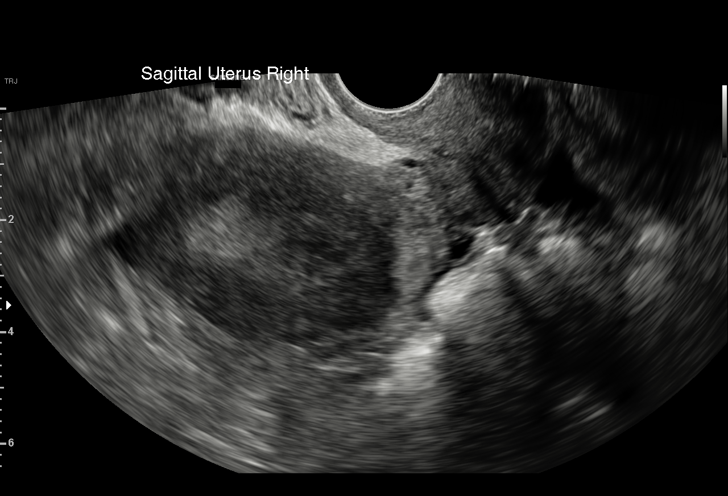
[im 37/66]
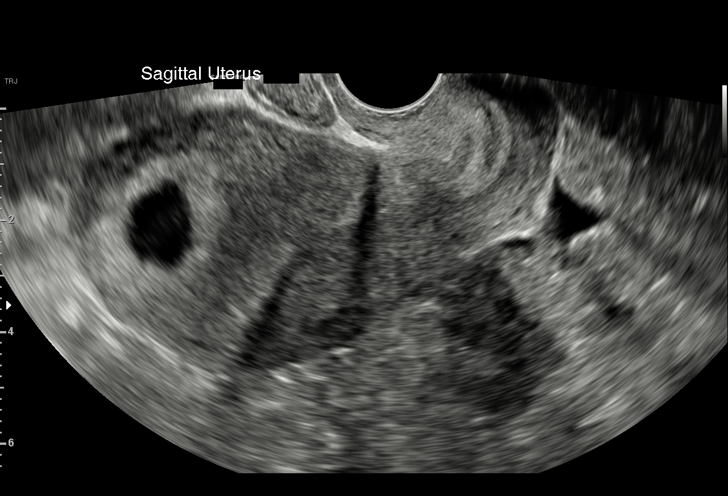
[im 41/66]
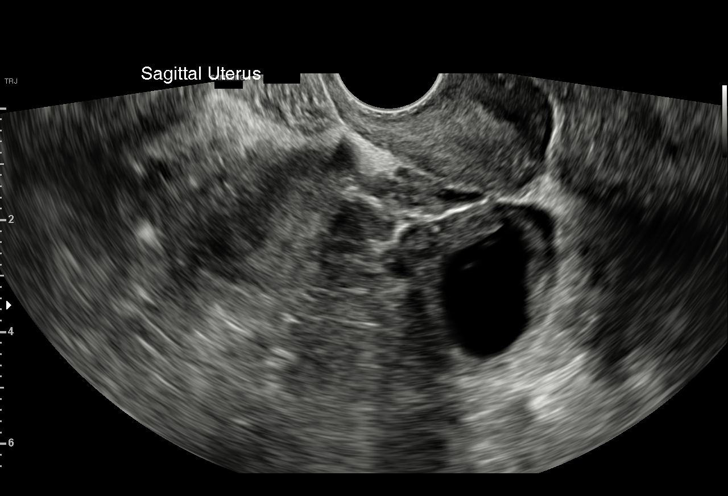
[im 46/66]
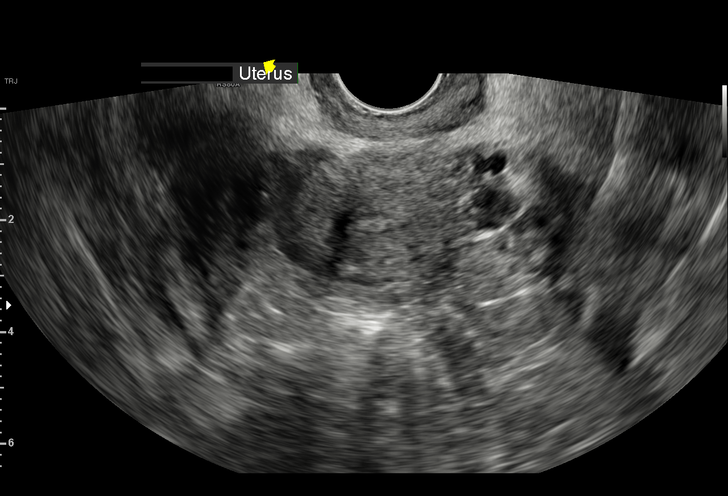
[im 51/66]
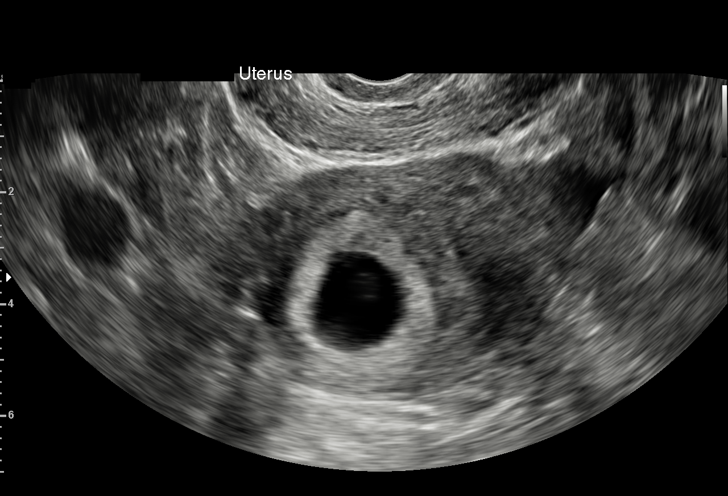
[im 56/66]
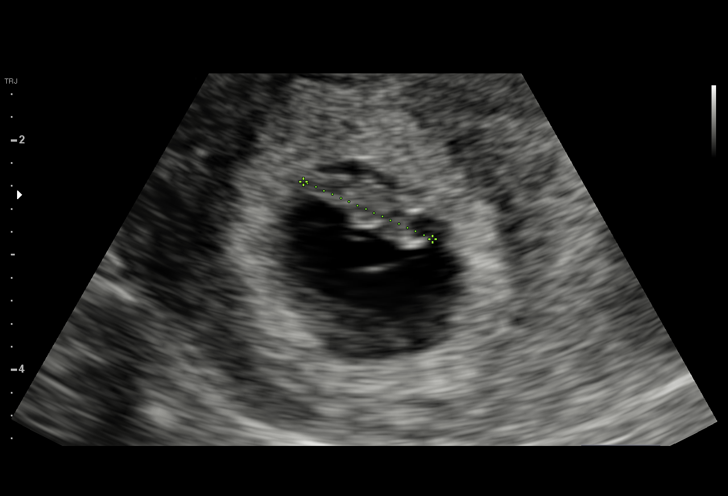
[im 61/66]
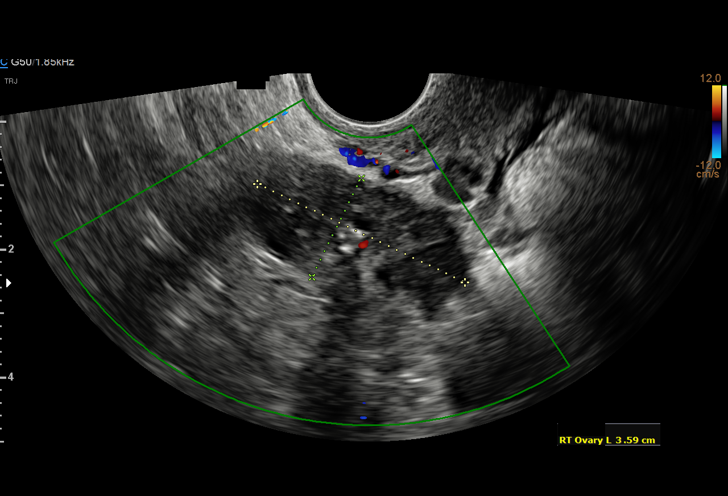
[im 66/66]
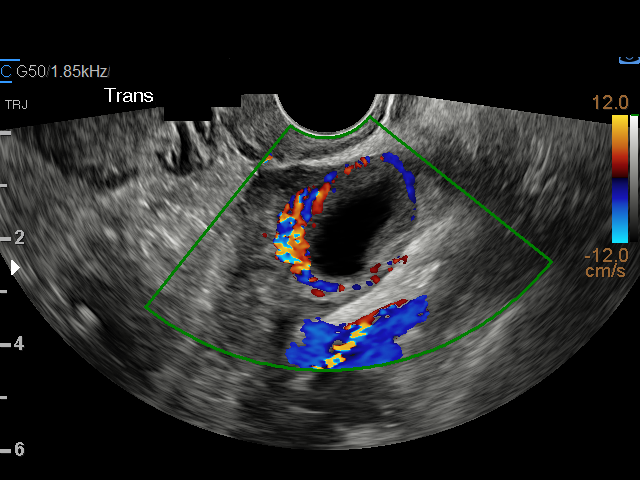

[15 of 28 positions shown; findings below may reference images not displayed]

FINDINGS: Intrauterine gestational sac: Single

Yolk sac:  Visualized.

Embryo:  Visualized.

Cardiac Activity: Visualized.

Heart Rate: 150 bpm

CRL:  12 mm   7 w   3 d                  US EDC: [DATE]

Subchorionic hemorrhage:  None visualized.

Maternal uterus/adnexae: Left ovarian corpus luteum noted. No mass
or abnormal free fluid identified.
IMPRESSION: Single living IUP measuring 7 weeks 3 days, with US EDC of
[DATE].

No significant maternal uterine or adnexal abnormality identified.

## 2018-05-28 MED ORDER — CONCEPT OB 130-92.4-1 MG PO CAPS: 1.0000 | ORAL_CAPSULE | Freq: Every day | ORAL | 11 refills | Status: AC

## 2018-05-28 MED ORDER — PROMETHAZINE HCL 25 MG PO TABS: 25.0000 mg | ORAL_TABLET | Freq: Four times a day (QID) | ORAL | 1 refills | Status: DC | PRN

## 2018-05-28 NOTE — MAU Note (Signed)
Pt presents with c/o nausea for a "couple of weeks.  Reports unable to keep any food down.  Denies VB, mild abdominal cramping. Reports +UPT yesterday.  Unsure of LMP.

## 2018-05-28 NOTE — Discharge Instructions (Signed)
Drink at least 8 8-oz glasses of water every day. °Take Tylenol 325 mg 2 tablets by mouth every 4 hours if needed for pain. °No smoking, no drugs, no alcohol.   °Take a prenatal vitamin one by mouth every day.   °Eat small frequent snacks to avoid nausea.   °Begin prenatal care as soon as possible. °

## 2018-05-28 NOTE — MAU Provider Note (Addendum)
History     CSN: [id number]  Arrival date and time: 05/28/18 1019   First Provider Initiated Contact with Patient 05/28/18 1056      Chief Complaint  Patient presents with  . Nausea   HPI Alexandra Gray 27 y.o.  Client comes to MAU as she has been having vomiting.  She vomited 3 times yesterday and then 2 times this morning.  She has been feeling tired and then did a pregnancy test yesterday which was positive.  Her menses are irregular but the last one was 6-10.  She has had periodic lower abdominal cramping but does not report any pain today.    Client had a previous C/S and gestational hypertension with preeclampsia with severe features and had MGSO4 during labor.  Client reports seeing her PCP - [facility] and has not had hypertension after the pregnancy.   OB History    Gravida  2   Para  1   Term  1   Preterm      AB      Living  1     SAB      TAB      Ectopic      Multiple  0   Live Births  1           Past Medical History:  Diagnosis Date  . Chlamydia   . Depression    doing ok now  . Gonorrhea   . Hypertension    sometimes is up, never on meds  . Mental disorder   . Ovarian cyst   . PID (pelvic inflammatory disease)   . Sleep apnea     Past Surgical History:  Procedure Laterality Date  . CESAREAN SECTION N/A 10/03/2015   Procedure: CESAREAN SECTION;  Surgeon: Tilda Burrow, MD;  Location: [facility];  Service: Obstetrics;  Laterality: N/A;  . KNEE SURGERY      Family History  Problem Relation Age of Onset  . Healthy Mother   . Healthy Father   . Healthy Brother   . Cancer Neg Hx   . Diabetes Neg Hx   . Heart disease Neg Hx   . Hypertension Neg Hx   . Stroke Neg Hx   . Hearing loss Neg Hx     Social History   Tobacco Use  . Smoking status: Never Smoker  . Smokeless tobacco: Never Used  Substance Use Topics  . Alcohol use: No  . Drug use: No    Allergies:  Allergies  Allergen Reactions  . Nubain [Nalbuphine Hcl]      Makes patient hot    Medications Prior to Admission  Medication Sig Dispense Refill Last Dose  . sertraline (ZOLOFT) 50 MG tablet TAKE ONE TABLET BY MOUTH ONCE DAILY 30 tablet 0 05/27/2018 at Unknown time  . cyclobenzaprine (FLEXERIL) 10 MG tablet Take 1 tablet (10 mg total) by mouth 2 (two) times daily as needed for muscle spasms. 20 tablet 0   . doxycycline (VIBRAMYCIN) 100 MG capsule Take 1 capsule (100 mg total) by mouth 2 (two) times daily. (Patient not taking: Reported on 11/16/2017) 28 capsule 0 Not Taking at Unknown time  . ibuprofen (ADVIL,MOTRIN) 600 MG tablet Take 1 tablet (600 mg total) by mouth every 6 (six) hours as needed for mild pain. (Patient not taking: Reported on 10/11/2017) 30 tablet 0 Not Taking  . naproxen (NAPROSYN) 375 MG tablet Take 1 tablet (375 mg total) by mouth 2 (two) times daily. 20 tablet  0   . oxyCODONE-acetaminophen (PERCOCET/ROXICET) 5-325 MG tablet Take 1 tablet by mouth every 4 (four) hours as needed for severe pain. 10 tablet 0 11/15/2017 at Unknown time  . Prenatal Multivit-Min-Fe-FA (PRENATAL VITAMINS) 0.8 MG tablet Take 1 tablet by mouth daily. (Patient not taking: Reported on 10/11/2017) 30 tablet 12 Not Taking  . topiramate (TOPAMAX) 25 MG tablet Take 1 tablet (25 mg total) daily by mouth. (Patient not taking: Reported on 11/12/2017) 30 tablet 5 Not Taking  . traMADol (ULTRAM) 50 MG tablet Take 1 tablet (50 mg total) by mouth every 6 (six) hours as needed. 20 tablet 0 11/15/2017 at Unknown time    Review of Systems  Constitutional: Positive for fatigue. Negative for fever.  Gastrointestinal: Positive for nausea and vomiting. Negative for abdominal pain, constipation and diarrhea.  Genitourinary: Negative for dysuria, vaginal bleeding and vaginal discharge.   Physical Exam   Blood pressure 127/80, pulse 83, temperature 98.8 F (37.1 C), temperature source Oral, resp. rate 18, height 5' 1.02" (1.55 m), weight 291 lb 12.8 oz (132.4 kg), last  menstrual period 05/10/2018, SpO2 97 %, currently breastfeeding.  Physical Exam  Nursing note and vitals reviewed. Constitutional: She is oriented to person, place, and time. She appears well-developed and well-nourished.  HENT:  Head: Normocephalic.  Eyes: EOM are normal.  Neck: Neck supple.  GI: Soft. There is no tenderness. There is no rebound and no guarding.  Unable to feel fundus - large abdomen, nontender   Musculoskeletal: Normal range of motion.  Neurological: She is alert and oriented to person, place, and time.  Skin: Skin is warm and dry.  Psychiatric: She has a normal mood and affect.    MAU Course  Procedures Results for orders placed or performed during the hospital encounter of 05/28/18 (from the past 24 hour(s))  Urinalysis, Routine w reflex microscopic     Status: Abnormal   Collection Time: 05/28/18 10:45 AM  Result Value Ref Range   Color, Urine YELLOW YELLOW   APPearance HAZY (A) CLEAR   Specific Gravity, Urine 1.021 1.005 - 1.030   pH 7.0 5.0 - 8.0   Glucose, UA NEGATIVE NEGATIVE mg/dL   Hgb urine dipstick NEGATIVE NEGATIVE   Bilirubin Urine NEGATIVE NEGATIVE   Ketones, ur NEGATIVE NEGATIVE mg/dL   Protein, ur NEGATIVE NEGATIVE mg/dL   Nitrite NEGATIVE NEGATIVE   Leukocytes, UA NEGATIVE NEGATIVE  Pregnancy, urine POC     Status: Abnormal   Collection Time: 05/28/18 10:50 AM  Result Value Ref Range   Preg Test, Ur POSITIVE (A) NEGATIVE  CBC     Status: None   Collection Time: 05/28/18 11:26 AM  Result Value Ref Range   WBC 10.2 4.0 - 10.5 K/uL   RBC 4.60 3.87 - 5.11 MIL/uL   Hemoglobin 12.7 12.0 - 15.0 g/dL   HCT 86.538.5 78.436.0 - 69.646.0 %   MCV 83.7 78.0 - 100.0 fL   MCH 27.6 26.0 - 34.0 pg   MCHC 33.0 30.0 - 36.0 g/dL   RDW 29.514.6 28.411.5 - 13.215.5 %   Platelets 274 150 - 400 K/uL  hCG, quantitative, pregnancy     Status: Abnormal   Collection Time: 05/28/18 11:26 AM  Result Value Ref Range   hCG, Beta Chain, Quant, S 41,127 (H) <5 mIU/mL    MDM Unable  to determine by physical exam size of uterus and whether this is a pregnancy of unknown location - will get ultrasound. No ketones in urine so will not give IVF  today.  Will prescribe medication for nausea. Client is [redacted]w[redacted]d by ultrasound today with EDC of 01-11-19.   Would consider prepregnancy weight is 291.  Reviewed chart and weight seems to be consistent (one weight was 40 pounds less in March but suspect that was a typo based on weights noted even before that)  Will prescribe phenergan for client to control nausea and encourage her to begin prenatal care asap due to her high risk status in a previous pregnancy.  Assessment and Plan  Morning Sickness without ketones in urine  Drink at least 8 8-oz glasses of water every day. Take Tylenol 325 mg 2 tablets by mouth every 4 hours if needed for pain. No smoking, no drugs, no alcohol.  Take a prenatal vitamin one by mouth every day.  Eat small frequent snacks to avoid nausea.  Begin prenatal care as soon as possible. Prescribed Phenergan PO for nausea but explained to client that she is likely keeping down more fluids than she realizes. Advised small frequent snacks rather than full meals and sipping small amounts of fluids throughout the day.  Adelaida Reindel L Sigrid Schwebach 05/28/2018, 10:59 AM

## 2018-06-06 ENCOUNTER — Encounter: Payer: Self-pay | Admitting: Advanced Practice Midwife

## 2018-06-21 ENCOUNTER — Encounter: Payer: Self-pay | Admitting: Advanced Practice Midwife

## 2018-06-25 ENCOUNTER — Other Ambulatory Visit: Payer: Self-pay

## 2018-06-25 ENCOUNTER — Encounter (HOSPITAL_COMMUNITY): Payer: Self-pay | Admitting: Emergency Medicine

## 2018-06-25 ENCOUNTER — Emergency Department (HOSPITAL_COMMUNITY)
Admission: EM | Admit: 2018-06-25 | Discharge: 2018-06-25 | Disposition: A | Discharge status: Home / Self Care | Payer: Medicaid Other | Attending: Emergency Medicine | Admitting: Emergency Medicine

## 2018-06-25 ENCOUNTER — Encounter: Payer: Self-pay | Admitting: Advanced Practice Midwife

## 2018-06-25 DIAGNOSIS — Z79899 Other long term (current) drug therapy: Secondary | ICD-10-CM | POA: Diagnosis not present

## 2018-06-25 DIAGNOSIS — K0889 Other specified disorders of teeth and supporting structures: Secondary | ICD-10-CM | POA: Insufficient documentation

## 2018-06-25 DIAGNOSIS — I10 Essential (primary) hypertension: Secondary | ICD-10-CM | POA: Diagnosis not present

## 2018-06-25 MED ORDER — LIDOCAINE VISCOUS HCL 2 % MT SOLN: 15.0000 mL | OROMUCOSAL | 2 refills | Status: DC | PRN

## 2018-06-25 MED ORDER — PENICILLIN V POTASSIUM 500 MG PO TABS: 500.0000 mg | ORAL_TABLET | Freq: Four times a day (QID) | ORAL | 0 refills | Status: AC

## 2018-06-25 MED ORDER — BUPIVACAINE-EPINEPHRINE (PF) 0.5% -1:200000 IJ SOLN
1.8000 mL | Freq: Once | INTRAMUSCULAR | Status: AC
  Administered 2018-06-25: 1.8 mL
  Filled 2018-06-25: qty 1.8

## 2018-06-25 NOTE — ED Triage Notes (Addendum)
Pt states she chipped a lower left molar a month ago. Pt states over the last week she started having increased pain and some swelling into left jaw. Pt is also [redacted] weeks pregnant.

## 2018-06-25 NOTE — ED Provider Notes (Signed)
[facility] EMERGENCY DEPARTMENT Provider Note   CSN: [id number] Arrival date & time: 06/25/18  4098     History   Chief Complaint Chief Complaint  Patient presents with  . Dental Pain    HPI Alexandra Gray is a 27 y.o. female.  HPI   Alexandra Gray is a 27 y.o. female, with a history of HTN, presenting to the ED with left lower dental pain for the last week.  Pain is aching, 10/10, radiating through the left side of the face.  Patient is [redacted] weeks pregnant.  Has tried Tylenol, salt water rinses, and cold compresses.  Denies fever/chills, nausea/vomiting, difficulty swallowing or breathing, difficulty speaking, or any other complaints.       Past Medical History:  Diagnosis Date  . Chlamydia   . Depression    doing ok now  . Gonorrhea   . Hypertension    sometimes is up, never on meds  . Mental disorder   . Ovarian cyst   . PID (pelvic inflammatory disease)   . Sleep apnea     Patient Active Problem List   Diagnosis Date Noted  . Morbid obesity (HCC) 05/28/2018  . History of pregnancy induced hypertension 12/06/2016  . History of trichomonal vaginitis 12/06/2016  . S/P primary low transverse C-section 10/03/2015  . 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 Suicidal ideation 08/04/2013    Past Surgical History:  Procedure Laterality Date  . CESAREAN SECTION N/A 10/03/2015   Procedure: CESAREAN SECTION;  Surgeon: Tilda Burrow, MD;  Location: [facility];  Service: Obstetrics;  Laterality: N/A;  . KNEE SURGERY       OB History    Gravida  2   Para  1   Term  1   Preterm      AB      Living  1     SAB      TAB      Ectopic      Multiple  0   Live Births  1            Home Medications    Prior to Admission medications   Medication Sig Start Date End Date Taking? Authorizing Provider  cyclobenzaprine (FLEXERIL) 10 MG tablet Take 1 tablet (10 mg total) by mouth 2  (two) times daily as needed for muscle spasms. 05/11/18   Liberty Handy, PA-C  lidocaine (XYLOCAINE) 2 % solution Use as directed 15 mLs in the mouth or throat as needed for mouth pain. 06/25/18   Nyaisha Simao C, PA-C  penicillin v potassium (VEETID) 500 MG tablet Take 1 tablet (500 mg total) by mouth 4 (four) times daily for 7 days. 06/25/18 07/02/18  Chen Saadeh C, PA-C  Prenat w/o A Vit-FeFum-FePo-FA (CONCEPT OB) 130-92.4-1 MG CAPS Take 1 capsule by mouth daily. 05/28/18 06/27/18  Currie Paris, NP  promethazine (PHENERGAN) 25 MG tablet Take 1 tablet (25 mg total) by mouth every 6 (six) hours as needed for nausea or vomiting. 05/28/18   Burleson, Brand Males, NP  sertraline (ZOLOFT) 50 MG tablet TAKE ONE TABLET BY MOUTH ONCE DAILY 05/08/16   Anyanwu, Jethro Bastos, MD    Family History Family History  Problem Relation Age of Onset  . Healthy Mother   . Healthy Father   . Healthy Brother   . Cancer Neg Hx   . Diabetes Neg Hx   . Heart disease Neg Hx   .  Hypertension Neg Hx   . Stroke Neg Hx   . Hearing loss Neg Hx     Social History Social History   Tobacco Use  . Smoking status: Never Smoker  . Smokeless tobacco: Never Used  Substance Use Topics  . Alcohol use: No  . Drug use: No     Allergies   Nubain [nalbuphine hcl]   Review of Systems Review of Systems  Constitutional: Negative for chills and fever.  HENT: Positive for dental problem and facial swelling. Negative for sore throat and trouble swallowing.   Respiratory: Negative for shortness of breath.   Gastrointestinal: Negative for nausea and vomiting.     Physical Exam Updated Vital Signs BP (!) 139/93 (BP Location: Right Arm)   Pulse 93   Temp 99.1 F (37.3 C) (Oral)   Resp 15   Ht 5' (1.524 m)   Wt 131.5 kg (290 lb)   LMP 05/10/2018 (LMP Unknown)   SpO2 97%   BMI 56.64 kg/m   Physical Exam  Constitutional: She appears well-developed and well-nourished. No distress.  HENT:  Head: Normocephalic and  atraumatic.  Dental caries noted to what appears to be the left mandibular first molar with surrounding tenderness.  Tooth appears to be stable. No noted area of swelling or fluctuance.  No trismus.  Mouth opening to at least 3 finger widths.  Handles oral secretions without difficulty.  No noted facial swelling.  No swelling or tenderness to the submental or submandibular regions.  No swelling or tenderness into the soft tissues of the neck.  Eyes: Conjunctivae are normal.  Neck: Normal range of motion. Neck supple.  Cardiovascular: Normal rate and regular rhythm.  Pulmonary/Chest: Effort normal. No stridor.  Lymphadenopathy:    She has no cervical adenopathy.  Neurological: She is alert.  Skin: Skin is warm and dry. She is not diaphoretic. No pallor.  Psychiatric: She has a normal mood and affect. Her behavior is normal.  Nursing note and vitals reviewed.    ED Treatments / Results  Labs (all labs ordered are listed, but only abnormal results are displayed) Labs Reviewed - No data to display  EKG None  Radiology No results found.  Procedures Dental Block Date/Time: 06/25/2018 9:40 AM Performed by: Anselm PancoastJoy, Henri Baumler C, PA-C Authorized by: Anselm PancoastJoy, Giovanni Bath C, PA-C   Consent:    Consent obtained:  Verbal   Consent given by:  Patient   Risks discussed:  Swelling, unsuccessful block and pain Indications:    Indications: dental pain   Location:    Block type:  Inferior alveolar   Laterality:  Left Procedure details (see MAR for exact dosages):    Topical anesthetic:  Benzocaine gel   Syringe type:  Controlled syringe   Needle gauge:  27 G   Anesthetic injected:  Bupivacaine 0.5% WITH epi   Injection procedure:  Anatomic landmarks identified, anatomic landmarks palpated, introduced needle, negative aspiration for blood and incremental injection Post-procedure details:    Outcome:  Pain relieved   Patient tolerance of procedure:  Tolerated well, no immediate complications   (including  critical care time)  Medications Ordered in ED Medications  bupivacaine-epinephrine (MARCAINE W/ EPI) 0.5% -1:200000 injection 1.8 mL (1.8 mLs Infiltration Given 06/25/18 1011)     Initial Impression / Assessment and Plan / ED Course  I have reviewed the triage vital signs and the nursing notes.  Pertinent labs & imaging results that were available during my care of the patient were reviewed by me and considered  in my medical decision making (see chart for details).     Patient presents with dental pain.  No noted abscess required drainage at the bedside.  Tolerated dental block without immediate complication.  Dental follow-up recommended.  Resources given. The patient was given instructions for home care as well as return precautions. Patient voices understanding of these instructions, accepts the plan, and is comfortable with discharge.  Final Clinical Impressions(s) / ED Diagnoses   Final diagnoses:  Pain, dental    ED Discharge Orders        Ordered    penicillin v potassium (VEETID) 500 MG tablet  4 times daily     06/25/18 0928    lidocaine (XYLOCAINE) 2 % solution  As needed     06/25/18 1610       Anselm Pancoast, PA-C 06/25/18 1016    Shaune Pollack, MD 06/26/18 1007

## 2018-06-25 NOTE — ED Notes (Signed)
Patient verbalizes understanding of discharge instructions. Opportunity for questioning and answers were provided. Armband removed by staff, pt discharged from ED ambulatory.   

## 2018-06-25 NOTE — Discharge Instructions (Addendum)
°  Dental Pain You have been seen today for dental pain. You should follow up with a dentist as soon as possible. This problem will not resolve on its own without the care of a dentist. Use tylenol for pain. Use the viscous lidocaine for mouth pain. Swish with the lidocaine and spit it out. Do not swallow it.  Please take all of your antibiotics until finished!   You may develop abdominal discomfort or diarrhea from the antibiotic.  You may help offset this with probiotics which you can buy or get in yogurt. Do not eat or take the probiotics until 2 hours after your antibiotic.   Dental Resource Guide  [facility] [address], [address] [place], [place] [address] [phone]  [facility] [address] [place], [place] [address] [phone]  [facility] [address] [place], [place] [address] [phone]  [facility] [address] [place], [place] [phone]  [facility] [address] [place], [place] [address] [phone]  [facility] [employer] [email or web address]  [employer] [address] [place], [place] [phone]  Website for free, low-income, or sliding scale dental services in [place]: www.freedental.us  To find a dentist in [place] and surrounding areas: [email or web address]-the-[email or web address]/find-[email or web address]-dentist  [facility] [employer][email or web address]/meetings-[email or web address]/-missions-[email or web address]-mercy  [facility] Medicaid Dentist [email or web address]://[email or web address]/find-[email or web address]/[email or web address]

## 2018-06-27 ENCOUNTER — Ambulatory Visit: Payer: Self-pay | Admitting: Clinical

## 2018-06-27 ENCOUNTER — Encounter: Payer: Self-pay | Admitting: Advanced Practice Midwife

## 2018-06-27 ENCOUNTER — Ambulatory Visit (INDEPENDENT_AMBULATORY_CARE_PROVIDER_SITE_OTHER): Payer: Medicaid Other | Admitting: Advanced Practice Midwife

## 2018-06-27 VITALS — BP 128/74 | HR 94 | Wt 284.3 lb

## 2018-06-27 DIAGNOSIS — O99210 Obesity complicating pregnancy, unspecified trimester: Secondary | ICD-10-CM | POA: Diagnosis not present

## 2018-06-27 DIAGNOSIS — Z3480 Encounter for supervision of other normal pregnancy, unspecified trimester: Secondary | ICD-10-CM

## 2018-06-27 DIAGNOSIS — Z8759 Personal history of other complications of pregnancy, childbirth and the puerperium: Secondary | ICD-10-CM

## 2018-06-27 DIAGNOSIS — Z8659 Personal history of other mental and behavioral disorders: Secondary | ICD-10-CM | POA: Diagnosis not present

## 2018-06-27 DIAGNOSIS — O34219 Maternal care for unspecified type scar from previous cesarean delivery: Secondary | ICD-10-CM | POA: Diagnosis not present

## 2018-06-27 DIAGNOSIS — O26891 Other specified pregnancy related conditions, first trimester: Secondary | ICD-10-CM | POA: Diagnosis not present

## 2018-06-27 DIAGNOSIS — R51 Headache: Secondary | ICD-10-CM

## 2018-06-27 DIAGNOSIS — R519 Headache, unspecified: Secondary | ICD-10-CM

## 2018-06-27 LAB — POCT URINALYSIS DIP (DEVICE)
Bilirubin Urine: NEGATIVE
GLUCOSE, UA: NEGATIVE mg/dL
HGB URINE DIPSTICK: NEGATIVE
Ketones, ur: 80 mg/dL — AB
Leukocytes, UA: NEGATIVE
NITRITE: NEGATIVE
Protein, ur: 30 mg/dL — AB
Specific Gravity, Urine: >=1.03 (ref 1.005–1.030)
UROBILINOGEN UA: 1 mg/dL (ref 0.0–1.0)
pH: 6 (ref 5.0–8.0)

## 2018-06-27 MED ORDER — BUTALBITAL-APAP-CAFFEINE 50-325-40 MG PO TABS: 1.0000 | ORAL_TABLET | Freq: Four times a day (QID) | ORAL | 2 refills | Status: DC | PRN

## 2018-06-27 NOTE — BH Specialist Note (Signed)
Integrated Behavioral Health Initial Visit  MRN: [id number] Name: Alexandra Gray  Number of Integrated Behavioral Health Clinician visits:: 1/6 Session Start time: 2:19  Session End time: 2:26 Total time: 7 minutes  Type of Service: Integrated Behavioral Health- Individual/Family Interpretor:No. Interpretor Name and Language: n/a   Warm Hand Off Completed.       SUBJECTIVE: Alexandra Gray is a 27 y.o. female accompanied by n/a Patient was referred by Sharen CounterLisa Leftwich-Kirby, CNM for initial OB introduction to integrated behavioral health services. Patient reports the following symptoms/concerns: Pt states she has a history of depression with no symptoms today, and no interest in taking BH meds in pregnancy; pt's main concern today is difficulty sleeping due to lack of comfortable position at night. Duration of problem: Current pregnancy; Severity of problem: mild  OBJECTIVE: Mood: Appropriate and Affect: Appropriate Risk of harm to self or others: No plan to harm self or others  LIFE CONTEXT: Family and Social: Pt has a  2yo daughter School/Work: - Self-Care: - Life Changes: Current pregnancy  GOALS ADDRESSED: n/a  INTERVENTIONS:  Standardized Assessments completed: GAD-7 and PHQ 9  ASSESSMENT: Patient currently experiencing Supervision of other normal pregnancy, antepartum   Patient may benefit from initial OB introduction to integrated behavior health services .  PLAN: 1. Follow up with behavioral health clinician on : As needed 2. Behavioral recommendations:  -Continue with plans to obtain a body pillow, for improved sleep 3. Referral(s): Integrated Behavioral Health Services (In Clinic) 4. "From scale of 1-10, how likely are you to follow plan?": 10  Rae LipsJamie C McMannes, LCSW  Depression screen [facility] 2/9 06/27/2018 10/01/2015 09/17/2015 08/27/2015 08/23/2015  Decreased Interest 0 0 0 0 0  Down, Depressed, Hopeless 1 0 0 0 0  PHQ - 2 Score 1 0 0 0 0  Altered  sleeping 0 - - - -  Tired, decreased energy 1 - - - -  Change in appetite 0 - - - -  Feeling bad or failure about yourself  0 - - - -  Trouble concentrating 0 - - - -  Moving slowly or fidgety/restless 0 - - - -  Suicidal thoughts 0 - - - -  PHQ-9 Score 2 - - - -   GAD 7 : Generalized Anxiety Score 06/27/2018  Nervous, Anxious, on Edge 0  Control/stop worrying 0  Worry too much - different things 0  Trouble relaxing 0  Restless 0  Easily annoyed or irritable 0  Afraid - awful might happen 0  Total GAD 7 Score 0

## 2018-06-27 NOTE — Patient Instructions (Addendum)
First Trimester of Pregnancy The first trimester of pregnancy is from week 1 until the end of week 13 (months 1 through 3). A week after a sperm fertilizes an egg, the egg will implant on the wall of the uterus. This embryo will begin to develop into a baby. Genes from you and your partner will form the baby. The female genes will determine whether the baby will be a boy or a girl. At 6-8 weeks, the eyes and face will be formed, and the heartbeat can be seen on ultrasound. At the end of 12 weeks, all the baby's organs will be formed. Now that you are pregnant, you will want to do everything you can to have a healthy baby. Two of the most important things are to get good prenatal care and to follow your health care provider's instructions. Prenatal care is all the medical care you receive before the baby's birth. This care will help prevent, find, and treat any problems during the pregnancy and childbirth. Body changes during your first trimester Your body goes through many changes during pregnancy. The changes vary from woman to woman.  You may gain or lose a couple of pounds at first.  You may feel sick to your stomach (nauseous) and you may throw up (vomit). If the vomiting is uncontrollable, call your health care provider.  You may tire easily.  You may develop headaches that can be relieved by medicines. All medicines should be approved by your health care provider.  You may urinate more often. Painful urination may mean you have a bladder infection.  You may develop heartburn as a result of your pregnancy.  You may develop constipation because certain hormones are causing the muscles that push stool through your intestines to slow down.  You may develop hemorrhoids or swollen veins (varicose veins).  Your breasts may begin to grow larger and become tender. Your nipples may stick out more, and the tissue that surrounds them (areola) may become darker.  Your gums may bleed and may be  sensitive to brushing and flossing.  Dark spots or blotches (chloasma, mask of pregnancy) may develop on your face. This will likely fade after the baby is born.  Your menstrual periods will stop.  You may have a loss of appetite.  You may develop cravings for certain kinds of food.  You may have changes in your emotions from day to day, such as being excited to be pregnant or being concerned that something may go wrong with the pregnancy and baby.  You may have more vivid and strange dreams.  You may have changes in your hair. These can include thickening of your hair, rapid growth, and changes in texture. Some women also have hair loss during or after pregnancy, or hair that feels dry or thin. Your hair will most likely return to normal after your baby is born.  What to expect at prenatal visits During a routine prenatal visit:  You will be weighed to make sure you and the baby are growing normally.  Your blood pressure will be taken.  Your abdomen will be measured to track your baby's growth.  The fetal heartbeat will be listened to between weeks 10 and 14 of your pregnancy.  Test results from any previous visits will be discussed.  Your health care provider may ask you:  How you are feeling.  If you are feeling the baby move.  If you have had any abnormal symptoms, such as leaking fluid, bleeding, severe headaches,   or abdominal cramping.  If you are using any tobacco products, including cigarettes, chewing tobacco, and electronic cigarettes.  If you have any questions.  Other tests that may be performed during your first trimester include:  Blood tests to find your blood type and to check for the presence of any previous infections. The tests will also be used to check for low iron levels (anemia) and protein on red blood cells (Rh antibodies). Depending on your risk factors, or if you previously had diabetes during pregnancy, you may have tests to check for high blood  sugar that affects pregnant women (gestational diabetes).  Urine tests to check for infections, diabetes, or protein in the urine.  An ultrasound to confirm the proper growth and development of the baby.  Fetal screens for spinal cord problems (spina bifida) and Down syndrome.  HIV (human immunodeficiency virus) testing. Routine prenatal testing includes screening for HIV, unless you choose not to have this test.  You may need other tests to make sure you and the baby are doing well.  Follow these instructions at home: Medicines  Follow your health care provider's instructions regarding medicine use. Specific medicines may be either safe or unsafe to take during pregnancy.  Take a prenatal vitamin that contains at least 600 micrograms (mcg) of folic acid.  If you develop constipation, try taking a stool softener if your health care provider approves. Eating and drinking  Eat a balanced diet that includes fresh fruits and vegetables, whole grains, good sources of protein such as meat, eggs, or tofu, and low-fat dairy. Your health care provider will help you determine the amount of weight gain that is right for you.  Avoid raw meat and uncooked cheese. These carry germs that can cause birth defects in the baby.  Eating four or five small meals rather than three large meals a day may help relieve nausea and vomiting. If you start to feel nauseous, eating a few soda crackers can be helpful. Drinking liquids between meals, instead of during meals, also seems to help ease nausea and vomiting.  Limit foods that are high in fat and processed sugars, such as fried and sweet foods.  To prevent constipation: ? Eat foods that are high in fiber, such as fresh fruits and vegetables, whole grains, and beans. ? Drink enough fluid to keep your urine clear or pale yellow. Activity  Exercise only as directed by your health care provider. Most women can continue their usual exercise routine during  pregnancy. Try to exercise for 30 minutes at least 5 days a week. Exercising will help you: ? Control your weight. ? Stay in shape. ? Be prepared for labor and delivery.  Experiencing pain or cramping in the lower abdomen or lower back is a good sign that you should stop exercising. Check with your health care provider before continuing with normal exercises.  Try to avoid standing for long periods of time. Move your legs often if you must stand in one place for a long time.  Avoid heavy lifting.  Wear low-heeled shoes and practice good posture.  You may continue to have sex unless your health care provider tells you not to. Relieving pain and discomfort  Wear a good support bra to relieve breast tenderness.  Take warm sitz baths to soothe any pain or discomfort caused by hemorrhoids. Use hemorrhoid cream if your health care provider approves.  Rest with your legs elevated if you have leg cramps or low back pain.  If you develop   varicose veins in your legs, wear support hose. Elevate your feet for 15 minutes, 3-4 times a day. Limit salt in your diet. Prenatal care  Schedule your prenatal visits by the twelfth week of pregnancy. They are usually scheduled monthly at first, then more often in the last 2 months before delivery.  Write down your questions. Take them to your prenatal visits.  Keep all your prenatal visits as told by your health care provider. This is important. Safety  Wear your seat belt at all times when driving.  Make a list of emergency phone numbers, including numbers for family, friends, the hospital, and police and fire departments. General instructions  Ask your health care provider for a referral to a local prenatal education class. Begin classes no later than the beginning of month 6 of your pregnancy.  Ask for help if you have counseling or nutritional needs during pregnancy. Your health care provider can offer advice or refer you to specialists for help  with various needs.  Do not use hot tubs, steam rooms, or saunas.  Do not douche or use tampons or scented sanitary pads.  Do not cross your legs for long periods of time.  Avoid cat litter boxes and soil used by cats. These carry germs that can cause birth defects in the baby and possibly loss of the fetus by miscarriage or stillbirth.  Avoid all smoking, herbs, alcohol, and medicines not prescribed by your health care provider. Chemicals in these products affect the formation and growth of the baby.  Do not use any products that contain nicotine or tobacco, such as cigarettes and e-cigarettes. If you need help quitting, ask your health care provider. You may receive counseling support and other resources to help you quit.  Schedule a dentist appointment. At home, brush your teeth with a soft toothbrush and be gentle when you floss. Contact a health care provider if:  You have dizziness.  You have mild pelvic cramps, pelvic pressure, or nagging pain in the abdominal area.  You have persistent nausea, vomiting, or diarrhea.  You have a bad smelling vaginal discharge.  You have pain when you urinate.  You notice increased swelling in your face, hands, legs, or ankles.  You are exposed to fifth disease or chickenpox.  You are exposed to German measles (rubella) and have never had it. Get help right away if:  You have a fever.  You are leaking fluid from your vagina.  You have spotting or bleeding from your vagina.  You have severe abdominal cramping or pain.  You have rapid weight gain or loss.  You vomit blood or material that looks like coffee grounds.  You develop a severe headache.  You have shortness of breath.  You have any kind of trauma, such as from a fall or a car accident. Summary  The first trimester of pregnancy is from week 1 until the end of week 13 (months 1 through 3).  Your body goes through many changes during pregnancy. The changes vary from  woman to woman.  You will have routine prenatal visits. During those visits, your health care provider will examine you, discuss any test results you may have, and talk with you about how you are feeling. This information is not intended to replace advice given to you by your health care provider. Make sure you discuss any questions you have with your health care provider. Document Released: 11/07/2001 Document Revised: 10/25/2016 Document Reviewed: 10/25/2016 Elsevier Interactive Patient Education  2018 Elsevier   Inc.   General Headache Without Cause A headache is pain or discomfort felt around the head or neck area. The specific cause of a headache may not be found. There are many causes and types of headaches. A few common ones are:  Tension headaches.  Migraine headaches.  Cluster headaches.  Chronic daily headaches.  Follow these instructions at home: Watch your condition for any changes. Take these steps to help with your condition: Managing pain  Take over-the-counter and prescription medicines only as told by your health care provider.  Lie down in a dark, quiet room when you have a headache.  If directed, apply ice to the head and neck area: ? Put ice in a plastic bag. ? Place a towel between your skin and the bag. ? Leave the ice on for 20 minutes, 2-3 times per day.  Use a heating pad or hot shower to apply heat to the head and neck area as told by your health care provider.  Keep lights dim if bright lights bother you or make your headaches worse. Eating and drinking  Eat meals on a regular schedule.  Limit alcohol use.  Decrease the amount of caffeine you drink, or stop drinking caffeine. General instructions  Keep all follow-up visits as told by your health care provider. This is important.  Keep a headache journal to help find out what may trigger your headaches. For example, write down: ? What you eat and drink. ? How much sleep you get. ? Any change  to your diet or medicines.  Try massage or other relaxation techniques.  Limit stress.  Sit up straight, and do not tense your muscles.  Do not use tobacco products, including cigarettes, chewing tobacco, or e-cigarettes. If you need help quitting, ask your health care provider.  Exercise regularly as told by your health care provider.  Sleep on a regular schedule. Get 7-9 hours of sleep, or the amount recommended by your health care provider. Contact a health care provider if:  Your symptoms are not helped by medicine.  You have a headache that is different from the usual headache.  You have nausea or you vomit.  You have a fever. Get help right away if:  Your headache becomes severe.  You have repeated vomiting.  You have a stiff neck.  You have a loss of vision.  You have problems with speech.  You have pain in the eye or ear.  You have muscular weakness or loss of muscle control.  You lose your balance or have trouble walking.  You feel faint or pass out.  You have confusion. This information is not intended to replace advice given to you by your health care provider. Make sure you discuss any questions you have with your health care provider. Document Released: 11/13/2005 Document Revised: 04/20/2016 Document Reviewed: 03/08/2015 Elsevier Interactive Patient Education  [employer]2018 Elsevier Inc.

## 2018-06-27 NOTE — Progress Notes (Signed)
   PRENATAL VISIT NOTE  Subjective:  Alexandra Gray is a 27 y.o. G2P1001 at 52w5dbeing seen today for ongoing prenatal care.  She is currently monitored for the following issues for this low-risk pregnancy and has MDD (major depressive disorder), recurrent severe, without psychosis ([place]; Suicidal ideation; Supervision of normal pregnancy; History of cesarean delivery affecting pregnancy; History of pregnancy induced hypertension; Morbid obesity ([place]; and Obesity in pregnancy on their problem list.  Patient reports headache.  Contractions: Not present. Vag. Bleeding: None.  Movement: Absent. Denies leaking of fluid.   The following portions of the patient's history were reviewed and updated as appropriate: allergies, current medications, past family history, past medical history, past social history, past surgical history and problem list. Problem list updated.  Objective:   Vitals:   06/27/18 1338  BP: 128/74  Pulse: 94  Weight: 284 lb 4.8 oz (129 kg)    Fetal Status:     Movement: Absent     General:  Alert, oriented and cooperative. Patient is in no acute distress.  Skin: Skin is warm and dry. No rash noted.   Cardiovascular: Normal heart rate noted  Respiratory: Normal respiratory effort, no problems with respiration noted  Abdomen: Soft, gravid, appropriate for gestational age.  Pain/Pressure: Present     Pelvic: Cervical exam deferred        Extremities: Normal range of motion.  Edema: None  Mental Status: Normal mood and affect. Normal behavior. Normal judgment and thought content.   Assessment and Plan:  Pregnancy: G2P1001 at 140w5d1. History of pregnancy induced hypertension --Normotensive today - Comp Met (CMET) - Protein / creatinine ratio, urine  2. Supervision of other normal pregnancy, antepartum --Discussed and offered genetic screening options, including Quad screen/AFP, NIPS testing, and option to decline testing. Benefits/risks/alternatives reviewed. Pt  aware that anatomy [place] form of genetic screening with lower accuracy in detecting trisomies than blood work.  Pt chooses genetic screening with NIPS today. - [place] OB COMP + 14 WK; Future - Genetic Screening - Hemoglobinopathy Evaluation - Obstetric Panel, Including HIV - SMN1 COPY NUMBER ANALYSIS (SMA Carrier Screen) - GC/Chlamydia probe amp [place] Cystic fibrosis gene test - Culture, OB Urine - CHL AMB BABYSCRIPTS OPT IN  3. Headache in pregnancy, antepartum, first trimester --Daily h/a, not resolved by Tylenol --Pt was caffeine drinker, quit due to pregnancy --Increase PO fluids, add caffeine in moderation PRN --Fioricet 1-2 tabs Q 6 hours PRN  4. History of cesarean delivery affecting pregnancy --C/S for fetal intolerance of labor during induction.   --Likely wants RLTCS. Discussed CS vs TOLAC today.  Questions answered.  5. Obesity in pregnancy --Baseline preeclampsia labs (pt with hx HTN previous pregnancy) --Start ASA in second trimester --Consider serial growth [place] fundal height difficult to measure due to body habitus  6. Hx major depression --With SI prior to previous pregnancy. Pt doing well now, reports good support.  She denied any PP depression following her last pregnancy.  Preterm labor symptoms and general obstetric precautions including but not limited to vaginal bleeding, contractions, leaking of fluid and fetal movement were reviewed in detail with the patient. Please refer to After Visit Summary for other counseling recommendations.  Return in about 1 month (around 07/25/2018).  Future Appointments  Date Time Provider [place]/2017  2:30 PM [place]

## 2018-06-27 NOTE — Progress Notes (Signed)
C/o headaches, take tylenol for them but no relief.

## 2018-06-28 LAB — PROTEIN / CREATININE RATIO, URINE
Creatinine, Urine: 244.1 mg/dL
PROTEIN UR: 39.3 mg/dL
Protein/Creat Ratio: 161 mg/g creat (ref 0–200)

## 2018-06-29 LAB — URINE CULTURE, OB REFLEX

## 2018-06-29 LAB — CULTURE, OB URINE

## 2018-06-30 ENCOUNTER — Encounter: Payer: Self-pay | Admitting: Advanced Practice Midwife

## 2018-07-04 ENCOUNTER — Encounter: Payer: Self-pay | Admitting: Advanced Practice Midwife

## 2018-07-04 LAB — SMN1 COPY NUMBER ANALYSIS (SMA CARRIER SCREENING)

## 2018-07-04 LAB — OBSTETRIC PANEL, INCLUDING HIV
Antibody Screen: NEGATIVE
BASOS ABS: 0 10*3/uL (ref 0.0–0.2)
Basos: 0 %
EOS (ABSOLUTE): 0.1 10*3/uL (ref 0.0–0.4)
EOS: 1 %
HEMOGLOBIN: 13 g/dL (ref 11.1–15.9)
HEP B S AG: NEGATIVE
HIV Screen 4th Generation wRfx: NONREACTIVE
Hematocrit: 38.7 % (ref 34.0–46.6)
IMMATURE GRANULOCYTES: 0 %
Immature Grans (Abs): 0 10*3/uL (ref 0.0–0.1)
Lymphocytes Absolute: 1.7 10*3/uL (ref 0.7–3.1)
Lymphs: 19 %
MCH: 27.6 pg (ref 26.6–33.0)
MCHC: 33.6 g/dL (ref 31.5–35.7)
MCV: 82 fL (ref 79–97)
MONOCYTES: 5 %
Monocytes Absolute: 0.5 10*3/uL (ref 0.1–0.9)
NEUTROS PCT: 75 %
Neutrophils Absolute: 6.6 10*3/uL (ref 1.4–7.0)
PLATELETS: 247 10*3/uL (ref 150–450)
RBC: 4.71 x10E6/uL (ref 3.77–5.28)
RDW: 14.7 % (ref 12.3–15.4)
RPR: NONREACTIVE
RUBELLA: 1.65 {index} (ref >0.99)
Rh Factor: POSITIVE
WBC: 8.8 10*3/uL (ref 3.4–10.8)

## 2018-07-04 LAB — HEMOGLOBINOPATHY EVALUATION
FERRITIN: 129 ng/mL (ref 15–150)
HGB A2 QUANT: 2.3 % (ref 1.8–3.2)
HGB F QUANT: 0 % (ref 0.0–2.0)
HGB SOLUBILITY: NEGATIVE
HGB VARIANT: 0 %
Hgb A: 97.7 % (ref 96.4–98.8)
Hgb C: 0 %
Hgb S: 0 %

## 2018-07-04 LAB — CYSTIC FIBROSIS GENE TEST

## 2018-07-04 LAB — COMPREHENSIVE METABOLIC PANEL
ALBUMIN: 4 g/dL (ref 3.5–5.5)
ALK PHOS: 88 IU/L (ref 39–117)
ALT: 22 IU/L (ref 0–32)
AST: 16 IU/L (ref 0–40)
Albumin/Globulin Ratio: 1.3 (ref 1.2–2.2)
BILIRUBIN TOTAL: 0.3 mg/dL (ref 0.0–1.2)
BUN / CREAT RATIO: 12 (ref 9–23)
BUN: 7 mg/dL (ref 6–20)
CO2: 18 mmol/L — ABNORMAL LOW (ref 20–29)
Calcium: 9.4 mg/dL (ref 8.7–10.2)
Chloride: 100 mmol/L (ref 96–106)
Creatinine, Ser: 0.57 mg/dL (ref 0.57–1.00)
GFR calc Af Amer: 147 mL/min/{1.73_m2} (ref >59)
GFR calc non Af Amer: 127 mL/min/{1.73_m2} (ref >59)
GLUCOSE: 116 mg/dL — AB (ref 65–99)
Globulin, Total: 3 g/dL (ref 1.5–4.5)
Potassium: 3.8 mmol/L (ref 3.5–5.2)
SODIUM: 137 mmol/L (ref 134–144)
Total Protein: 7 g/dL (ref 6.0–8.5)

## 2018-07-09 ENCOUNTER — Encounter: Payer: Self-pay | Admitting: Advanced Practice Midwife

## 2018-07-22 ENCOUNTER — Encounter: Payer: Self-pay | Admitting: Advanced Practice Midwife

## 2018-07-25 ENCOUNTER — Encounter: Payer: Self-pay | Admitting: Obstetrics and Gynecology

## 2018-07-26 ENCOUNTER — Ambulatory Visit (INDEPENDENT_AMBULATORY_CARE_PROVIDER_SITE_OTHER): Payer: Medicaid Other | Admitting: Obstetrics and Gynecology

## 2018-07-26 VITALS — BP 130/71 | HR 98 | Wt 289.4 lb

## 2018-07-26 DIAGNOSIS — O99210 Obesity complicating pregnancy, unspecified trimester: Secondary | ICD-10-CM

## 2018-07-26 DIAGNOSIS — Z3480 Encounter for supervision of other normal pregnancy, unspecified trimester: Secondary | ICD-10-CM

## 2018-07-26 DIAGNOSIS — R7301 Impaired fasting glucose: Secondary | ICD-10-CM

## 2018-07-26 LAB — POCT URINALYSIS DIP (DEVICE)
BILIRUBIN URINE: NEGATIVE
Glucose, UA: NEGATIVE mg/dL
HGB URINE DIPSTICK: NEGATIVE
KETONES UR: 40 mg/dL — AB
Leukocytes, UA: NEGATIVE
Nitrite: NEGATIVE
PH: 6 (ref 5.0–8.0)
Protein, ur: 30 mg/dL — AB
Specific Gravity, Urine: 1.025 (ref 1.005–1.030)
Urobilinogen, UA: 1 mg/dL (ref 0.0–1.0)

## 2018-07-26 NOTE — Patient Instructions (Signed)
   Genetic Screening Results Information: You are having genetic testing called [facility] today.  It will take approximately 2 weeks before the results are available.  To get your results, you need Internet access to a web browser to search [place]/MyChart (the direct app on your phone will not give you these results).  Then select Lab Scanned and click on the blue hyper link that says View Image to see your [facility] results.  You can also use the directions on the purple card given to look up your results directly on the [facility] website.  

## 2018-07-26 NOTE — Progress Notes (Addendum)
   PRENATAL VISIT NOTE  Subjective:  Alexandra Gray is a 27 y.o. G2P1001 at 4637w6d being seen today for ongoing prenatal care.  She is currently monitored for the following issues for this low-risk pregnancy and has MDD (major depressive disorder), recurrent severe, without psychosis (HCC); Suicidal ideation; Supervision of normal pregnancy; History of cesarean delivery affecting pregnancy; History of pregnancy induced hypertension; Morbid obesity (HCC); Obesity in pregnancy; and Elevated fasting blood sugar on their problem list.  Patient reports Pelvic pressure worse with movement .  Contractions: Irregular. Vag. Bleeding: None.  Movement: Present. Denies leaking of fluid.   The following portions of the patient's history were reviewed and updated as appropriate: allergies, current medications, past family history, past medical history, past social history, past surgical history and problem list. Problem list updated.  Objective:   Vitals:   07/26/18 1447  BP: 130/71  Pulse: 98  Weight: 289 lb 6.4 oz (131.3 kg)    Fetal Status: Fetal Heart Rate (bpm): 150   Movement: Present     General:  Alert, oriented and cooperative. Patient is in no acute distress.  Skin: Skin is warm and dry. No rash noted.   Cardiovascular: Normal heart rate noted  Respiratory: Normal respiratory effort, no problems with respiration noted  Abdomen: Soft, gravid, appropriate for gestational age.  Pain/Pressure: Present     Pelvic: Cervical exam deferred        Extremities: Normal range of motion.  Edema: Trace  Mental Status: Normal mood and affect. Normal behavior. Normal judgment and thought content.   Assessment and Plan:  Pregnancy: G2P1001 at 1637w6d  1. Supervision of other normal pregnancy, antepartum - US MFM OB DETAIL +14 WK; Future - HgB A1c - Urinalysis, dipstick only  2. Morbid obesity (HCC) - US MFM OB DETAIL +14 WK; Future - HgB A1c - ASA daily, continue   3. Obesity in pregnancy -  US MFM OB DETAIL +14 WK; Future  4. Elevated fasting blood sugar  - BS on CMP 116 - A1C today   Preterm labor symptoms and general obstetric precautions including but not limited to vaginal bleeding, contractions, leaking of fluid and fetal movement were reviewed in detail with the patient. Please refer to After Visit Summary for other counseling recommendations.  Return in about 4 weeks (around 08/23/2018) for high risk OB, schedule with MD.  Future Appointments  Date Time Provider Department Center  08/09/2018  8:20 AM [facility]  08/23/2018  2:15 PM [facility] US 4 [facility]    Venia CarbonJennifer Melisha Eggleton, NP

## 2018-07-26 NOTE — Progress Notes (Signed)
Pt reports pain and pressure in her lower abdomen but denies any vaginally.  Pt reports irregular contractions that she describes as a tightening starting from her back and moving to the front.  She reports it usually happens once a day and 2-3 times in an hour on that day.  Venia CarbonJennifer Rasch NP informed.

## 2018-07-27 LAB — HEMOGLOBIN A1C
Est. average glucose Bld gHb Est-mCnc: 97 mg/dL
Hgb A1c MFr Bld: 5 % (ref 4.8–5.6)

## 2018-07-29 ENCOUNTER — Other Ambulatory Visit: Payer: Self-pay

## 2018-07-29 ENCOUNTER — Encounter (HOSPITAL_COMMUNITY): Payer: Self-pay | Admitting: *Deleted

## 2018-07-29 ENCOUNTER — Inpatient Hospital Stay (HOSPITAL_COMMUNITY)
Admission: AD | Admit: 2018-07-29 | Discharge: 2018-07-29 | Disposition: A | Discharge status: Home / Self Care | Payer: Medicaid Other | Source: Ambulatory Visit | Attending: Obstetrics and Gynecology | Admitting: Obstetrics and Gynecology

## 2018-07-29 DIAGNOSIS — O26892 Other specified pregnancy related conditions, second trimester: Secondary | ICD-10-CM | POA: Insufficient documentation

## 2018-07-29 DIAGNOSIS — R109 Unspecified abdominal pain: Secondary | ICD-10-CM

## 2018-07-29 DIAGNOSIS — Z3A16 16 weeks gestation of pregnancy: Secondary | ICD-10-CM

## 2018-07-29 DIAGNOSIS — B3731 Acute candidiasis of vulva and vagina: Secondary | ICD-10-CM

## 2018-07-29 DIAGNOSIS — B373 Candidiasis of vulva and vagina: Secondary | ICD-10-CM | POA: Diagnosis not present

## 2018-07-29 DIAGNOSIS — O98812 Other maternal infectious and parasitic diseases complicating pregnancy, second trimester: Secondary | ICD-10-CM | POA: Insufficient documentation

## 2018-07-29 HISTORY — DX: Personal history of other infectious and parasitic diseases: Z86.19

## 2018-07-29 LAB — COMPREHENSIVE METABOLIC PANEL
ALK PHOS: 67 U/L (ref 38–126)
ALT: 15 U/L (ref 0–44)
AST: 16 U/L (ref 15–41)
Albumin: 3.4 g/dL — ABNORMAL LOW (ref 3.5–5.0)
Anion gap: 12 (ref 5–15)
BILIRUBIN TOTAL: 0.4 mg/dL (ref 0.3–1.2)
BUN: 6 mg/dL (ref 6–20)
CALCIUM: 9 mg/dL (ref 8.9–10.3)
CO2: 21 mmol/L — ABNORMAL LOW (ref 22–32)
Chloride: 102 mmol/L (ref 98–111)
Creatinine, Ser: 0.59 mg/dL (ref 0.44–1.00)
Glucose, Bld: 98 mg/dL (ref 70–99)
POTASSIUM: 3.5 mmol/L (ref 3.5–5.1)
Sodium: 135 mmol/L (ref 135–145)
TOTAL PROTEIN: 6.8 g/dL (ref 6.5–8.1)

## 2018-07-29 LAB — URINALYSIS, ROUTINE W REFLEX MICROSCOPIC
BILIRUBIN URINE: NEGATIVE
Glucose, UA: NEGATIVE mg/dL
HGB URINE DIPSTICK: NEGATIVE
Ketones, ur: NEGATIVE mg/dL
Leukocytes, UA: NEGATIVE
Nitrite: NEGATIVE
PH: 6 (ref 5.0–8.0)
Protein, ur: NEGATIVE mg/dL
SPECIFIC GRAVITY, URINE: 1.023 (ref 1.005–1.030)

## 2018-07-29 LAB — GLUCOSE, CAPILLARY: Glucose-Capillary: 86 mg/dL (ref 70–99)

## 2018-07-29 LAB — WET PREP, GENITAL
Sperm: NONE SEEN
Trich, Wet Prep: NONE SEEN

## 2018-07-29 LAB — CBC
HEMATOCRIT: 37.7 % (ref 36.0–46.0)
Hemoglobin: 12.6 g/dL (ref 12.0–15.0)
MCH: 27.9 pg (ref 26.0–34.0)
MCHC: 33.4 g/dL (ref 30.0–36.0)
MCV: 83.4 fL (ref 78.0–100.0)
Platelets: 218 10*3/uL (ref 150–400)
RBC: 4.52 MIL/uL (ref 3.87–5.11)
RDW: 13.8 % (ref 11.5–15.5)
WBC: 10.5 10*3/uL (ref 4.0–10.5)

## 2018-07-29 MED ORDER — TERCONAZOLE 0.8 % VA CREA: 1.0000 | TOPICAL_CREAM | Freq: Every day | VAGINAL | 0 refills | Status: DC

## 2018-07-29 MED ORDER — HYDROMORPHONE HCL 1 MG/ML IJ SOLN
1.0000 mg | Freq: Once | INTRAMUSCULAR | Status: AC
  Administered 2018-07-29: 1 mg via INTRAMUSCULAR
  Filled 2018-07-29: qty 1

## 2018-07-29 MED ORDER — PROMETHAZINE HCL 25 MG/ML IJ SOLN
25.0000 mg | Freq: Once | INTRAMUSCULAR | Status: AC
  Administered 2018-07-29: 25 mg via INTRAVENOUS
  Filled 2018-07-29: qty 1

## 2018-07-29 NOTE — Discharge Instructions (Signed)
Abdominal Pain During Pregnancy °Abdominal pain is common in pregnancy. Most of the time, it does not cause harm. There are many causes of abdominal pain. Some causes are more serious than others and sometimes the cause is not known. Abdominal pain can be a sign that something is very wrong with the pregnancy or the pain may have nothing to do with the pregnancy. Always tell your health care provider if you have any abdominal pain. °Follow these instructions at home: °· Do not have sex or put anything in your vagina until your symptoms go away completely. °· Watch your abdominal pain for any changes. °· Get plenty of rest until your pain improves. °· Drink enough fluid to keep your urine clear or pale yellow. °· Take over-the-counter or prescription medicines only as told by your health care provider. °· Keep all follow-up visits as told by your health care provider. This is important. °Contact a health care provider if: °· You have a fever. °· Your pain gets worse or you have cramping. °· Your pain continues after resting. °Get help right away if: °· You are bleeding, leaking fluid, or passing tissue from the vagina. °· You have vomiting or diarrhea that does not go away. °· You have painful or bloody urination. °· You notice a decrease in your baby's movements. °· You feel very weak or faint. °· You have shortness of breath. °· You develop a severe headache with abdominal pain. °· You have abnormal vaginal discharge with abdominal pain. °This information is not intended to replace advice given to you by your health care provider. Make sure you discuss any questions you have with your health care provider. °Document Released: 11/13/2005 Document Revised: 08/24/2016 Document Reviewed: 06/12/2013 °Elsevier Interactive Patient Education © 2018 Else[employer] Inc. ° °Vaginal Yeast infection, Adult °Vaginal yeast infection is a condition that causes soreness, swelling, and redness (inflammation) of the vagina. It also causes  vaginal discharge. This is a common condition. Some women get this infection frequently. °What are the causes? °This condition is caused by a change in the normal balance of the yeast (candida) and bacteria that live in the vagina. This change causes an overgrowth of yeast, which causes the inflammation. °What increases the risk? °This condition is more likely to develop in: °· Women who take antibiotic medicines. °· Women who have diabetes. °· Women who take birth control pills. °· Women who are pregnant. °· Women who douche often. °· Women who have a weak defense (immune) system. °· Women who have been taking steroid medicines for a long time. °· Women who frequently wear tight clothing. ° °What are the signs or symptoms? °Symptoms of this condition include: °· White, thick vaginal discharge. °· Swelling, itching, redness, and irritation of the vagina. The lips of the vagina (vulva) may be affected as well. °· Pain or a burning feeling while urinating. °· Pain during sex. ° °How is this diagnosed? °This condition is diagnosed with a medical history and physical exam. This will include a pelvic exam. Your health care provider will examine a sample of your vaginal discharge under a microscope. Your health care provider may send this sample for testing to confirm the diagnosis. °How is this treated? °This condition is treated with medicine. Medicines may be over-the-counter or prescription. You may be told to use one or more of the following: °· Medicine that is taken orally. °· Medicine that is applied as a cream. °· Medicine that is inserted directly into the vagina (suppository). ° °  Follow these instructions at home:  Take or apply over-the-counter and prescription medicines only as told by your health care provider.  Do not have sex until your health care provider has approved. Tell your sex partner that you have a yeast infection. That person should go to his or her health care provider if he or she develops  symptoms.  Do not wear tight clothes, such as pantyhose or tight pants.  Avoid using tampons until your health care provider approves.  Eat more yogurt. This may help to keep your yeast infection from returning.  Try taking a sitz bath to help with discomfort. This is a warm water bath that is taken while you are sitting down. The water should only come up to your hips and should cover your buttocks. Do this 3-4 times per day or as told by your health care provider.  Do not douche.  Wear breathable, cotton underwear.  If you have diabetes, keep your blood sugar levels under control. Contact a health care provider if:  You have a fever.  Your symptoms go away and then return.  Your symptoms do not get better with treatment.  Your symptoms get worse.  You have new symptoms.  You develop blisters in or around your vagina.  You have blood coming from your vagina and it is not your menstrual period.  You develop pain in your abdomen. This information is not intended to replace advice given to you by your health care provider. Make sure you discuss any questions you have with your health care provider. Document Released: 08/23/2005 Document Revised: 04/26/2016 Document Reviewed: 05/17/2015 Elsevier Interactive Patient Education  2018 [employer].

## 2018-07-29 NOTE — MAU Provider Note (Signed)
Chief Complaint: Abdominal Pain   First Provider Initiated Contact with Patient 07/29/18 1310     SUBJECTIVE HPI: Alexandra Gray is a 27 y.o. G2P1001 at [redacted]w[redacted]d who presents to [facility] Admissions reporting abdominal pain. Sudden onset of symptoms 2 hours ago. Pain worse in suprapubic area but endorses pain throughout her abdomen. Nausea & vomiting started with pain. Has vomited 3 times this morning and continues to be nauseated. Denies fever/chills, dysuria, vaginal bleeding, LOF, diarrhea, or constipation. No recent intercourse. Was sitting down at home when symptoms began.   Location: abdomen Quality: squeezing Severity: 9/10 on pain scale Duration: 2 hours Timing: constant Modifying factors: worse with movement. Not improved with medication.  Associated signs and symptoms: n/v  Past Medical History:  Diagnosis Date  . Chlamydia   . Depression    doing ok now  . Gonorrhea   . Hx of trichomoniasis   . Hypertension    sometimes is up, never on meds  . Mental disorder   . Ovarian cyst   . PID (pelvic inflammatory disease)   . Sleep apnea    OB History  Gravida Para Term Preterm AB Living  2 1 1     1   SAB TAB Ectopic Multiple Live Births        0 1    # Outcome Date GA Lbr Len/2nd Weight Sex Delivery Anes PTL Lv  2 Current           1 Term 10/03/15 [redacted]w[redacted]d  1830 g F CS-LTranv Spinal  LIV   Past Surgical History:  Procedure Laterality Date  . CESAREAN SECTION N/A 10/03/2015   Procedure: CESAREAN SECTION;  Surgeon: Tilda Burrow, MD;  Location: [facility];  Service: Obstetrics;  Laterality: N/A;  . KNEE SURGERY     Social History   Socioeconomic History  . Marital status: Single    Spouse name: Not on file  . Number of children: 1  . Years of education: 60  . Highest education level: Not on file  Occupational History  . Occupation: disabled  Social Needs  . Financial resource strain: Not on file  . Food insecurity:    Worry: Not on file    Inability: Not on file   . Transportation needs:    Medical: Not on file    Non-medical: Not on file  Tobacco Use  . Smoking status: Never Smoker  . Smokeless tobacco: Never Used  Substance and Sexual Activity  . Alcohol use: No  . Drug use: No  . Sexual activity: Not Currently    Birth control/protection: None  Lifestyle  . Physical activity:    Days per week: Not on file    Minutes per session: Not on file  . Stress: Not on file  Relationships  . Social connections:    Talks on phone: Not on file    Gets together: Not on file    Attends religious service: Not on file    Active member of club or organization: Not on file    Attends meetings of clubs or organizations: Not on file    Relationship status: Not on file  . Intimate partner violence:    Fear of current or ex partner: Not on file    Emotionally abused: Not on file    Physically abused: Not on file    Forced sexual activity: Not on file  Other Topics Concern  . Not on file  Social History Narrative   Lives alone in a one  story home.  Has one child.     On disability for depression and PTSD.     Education: 9th grade.    She is in school for [employer].     Family History  Problem Relation Age of Onset  . Healthy Mother   . Healthy Father   . Healthy Brother   . Cancer Neg Hx   . Diabetes Neg Hx   . Heart disease Neg Hx   . Hypertension Neg Hx   . Stroke Neg Hx   . Hearing loss Neg Hx    No current facility-administered medications on file prior to encounter.    Current Outpatient Medications on File Prior to Encounter  Medication Sig Dispense Refill  . butalbital-acetaminophen-caffeine (FIORICET, ESGIC) 50-325-40 MG tablet Take 1-2 tablets by mouth every 6 (six) hours as needed for headache. 20 tablet 2  . cyclobenzaprine (FLEXERIL) 10 MG tablet Take 1 tablet (10 mg total) by mouth 2 (two) times daily as needed for muscle spasms. (Patient not taking: Reported on 07/26/2018) 20 tablet 0  . lidocaine (XYLOCAINE) 2 % solution Use as  directed 15 mLs in the mouth or throat as needed for mouth pain. 100 mL 2  . promethazine (PHENERGAN) 25 MG tablet Take 1 tablet (25 mg total) by mouth every 6 (six) hours as needed for nausea or vomiting. 30 tablet 1   Allergies  Allergen Reactions  . Nubain [Nalbuphine Hcl]     Makes patient hot    I have reviewed patient's Past Medical Hx, Surgical Hx, Family Hx, Social Hx, medications and allergies.   Review of Systems  Constitutional: Negative.   Gastrointestinal: Positive for abdominal pain, nausea and vomiting. Negative for constipation and diarrhea.  Genitourinary: Negative.     OBJECTIVE Patient Vitals for the past 24 hrs:  BP Temp Temp src Pulse Resp SpO2  07/29/18 1540 122/85 - - - - -  07/29/18 1252 (!) 142/88 - - - - -  07/29/18 1235 (!) 154/91 98.6 F (37 C) Oral 91 20 100 %   Constitutional: Well-developed, well-nourished female in no acute distress.  Cardiovascular: normal rate & rhythm, no murmur Respiratory: normal rate and effort. Lung sounds clear throughout GI: TTP throughout lower abdomen. Abd soft. Pos BS x 4. No guarding or rebound  tenderness MS: Extremities nontender, no edema, normal ROM Neurologic: Alert and oriented x 4.  GU:     BIMANUAL: No CMT. cervix closed; uterus normal size, no adnexal  tenderness or masses.    LAB RESULTS Results for orders placed or performed during the hospital encounter of 07/29/18 (from the past 24 hour(s))  Wet prep, genital     Status: Abnormal   Collection Time: 07/29/18  1:31 PM  Result Value Ref Range   Yeast Wet Prep HPF POC PRESENT (A) NONE SEEN   Trich, Wet Prep NONE SEEN NONE SEEN   Clue Cells Wet Prep HPF POC PRESENT (A) NONE SEEN   WBC, Wet Prep HPF POC MODERATE (A) NONE SEEN   Sperm NONE SEEN   Glucose, capillary     Status: None   Collection Time: 07/29/18  1:41 PM  Result Value Ref Range   Glucose-Capillary 86 70 - 99 mg/dL  CBC     Status: None   Collection Time: 07/29/18  1:45 PM  Result Value  Ref Range   WBC 10.5 4.0 - 10.5 K/uL   RBC 4.52 3.87 - 5.11 MIL/uL   Hemoglobin 12.6 12.0 - 15.0 g/dL  HCT 37.7 36.0 - 46.0 %   MCV 83.4 78.0 - 100.0 fL   MCH 27.9 26.0 - 34.0 pg   MCHC 33.4 30.0 - 36.0 g/dL   RDW 29.5 62.1 - 30.8 %   Platelets 218 150 - 400 K/uL  Comprehensive metabolic panel     Status: Abnormal   Collection Time: 07/29/18  1:45 PM  Result Value Ref Range   Sodium 135 135 - 145 mmol/L   Potassium 3.5 3.5 - 5.1 mmol/L   Chloride 102 98 - 111 mmol/L   CO2 21 (L) 22 - 32 mmol/L   Glucose, Bld 98 70 - 99 mg/dL   BUN 6 6 - 20 mg/dL   Creatinine, Ser 6.57 0.44 - 1.00 mg/dL   Calcium 9.0 8.9 - 84.6 mg/dL   Total Protein 6.8 6.5 - 8.1 g/dL   Albumin 3.4 (L) 3.5 - 5.0 g/dL   AST 16 15 - 41 U/L   ALT 15 0 - 44 U/L   Alkaline Phosphatase 67 38 - 126 U/L   Total Bilirubin 0.4 0.3 - 1.2 mg/dL   GFR calc non Af Amer >60 >60 mL/min   GFR calc Af Amer >60 >60 mL/min   Anion gap 12 5 - 15  Urinalysis, Routine w reflex microscopic     Status: Abnormal   Collection Time: 07/29/18  2:25 PM  Result Value Ref Range   Color, Urine YELLOW YELLOW   APPearance HAZY (A) CLEAR   Specific Gravity, Urine 1.023 1.005 - 1.030   pH 6.0 5.0 - 8.0   Glucose, UA NEGATIVE NEGATIVE mg/dL   Hgb urine dipstick NEGATIVE NEGATIVE   Bilirubin Urine NEGATIVE NEGATIVE   Ketones, ur NEGATIVE NEGATIVE mg/dL   Protein, ur NEGATIVE NEGATIVE mg/dL   Nitrite NEGATIVE NEGATIVE   Leukocytes, UA NEGATIVE NEGATIVE    MAU COURSE Orders Placed This Encounter  Procedures  . Wet prep, genital  . Urinalysis, Routine w reflex microscopic  . CBC  . Comprehensive metabolic panel  . Glucose, capillary  . Discharge patient   Meds ordered this encounter  Medications  . HYDROmorphone (DILAUDID) injection 1 mg  . promethazine (PHENERGAN) injection 25 mg  . terconazole (TERAZOL 3) 0.8 % vaginal cream    Sig: Place 1 applicator vaginally at bedtime.    Dispense:  20 g    Refill:  0    Order Specific  Question:   Supervising Provider    Answer:   Galen Daft    MDM FHT 160 by doppler Cervix closed/thick IM dilaudid & phenergan for pain --- reports improvement Labs, CBG, & u/a normal. Wet prep shows yeast, will treated GC/CT pending  ASSESSMENT 1. Abdominal pain during pregnancy in second trimester   2. [redacted] weeks gestation of pregnancy   3. Vaginal yeast infection     PLAN Discharge home in stable condition.   Allergies as of 07/29/2018      Reactions   Nubain [nalbuphine Hcl]    Makes patient hot      Medication List    TAKE these medications   butalbital-acetaminophen-caffeine 50-325-40 MG tablet Commonly known as:  FIORICET, ESGIC Take 1-2 tablets by mouth every 6 (six) hours as needed for headache.   cyclobenzaprine 10 MG tablet Commonly known as:  FLEXERIL Take 1 tablet (10 mg total) by mouth 2 (two) times daily as needed for muscle spasms.   lidocaine 2 % solution Commonly known as:  XYLOCAINE Use as directed 15 mLs in the  mouth or throat as needed for mouth pain.   promethazine 25 MG tablet Commonly known as:  PHENERGAN Take 1 tablet (25 mg total) by mouth every 6 (six) hours as needed for nausea or vomiting.   terconazole 0.8 % vaginal cream Commonly known as:  TERAZOL 3 Place 1 applicator vaginally at bedtime.        Judeth Horn, NP 07/29/2018  5:13 PM

## 2018-07-29 NOTE — MAU Note (Addendum)
Pain in lower abd, sudden onset, never had anything like this before.  Real tight, not letting up. In wc, assisted back.

## 2018-07-30 ENCOUNTER — Encounter: Payer: Self-pay | Admitting: Obstetrics and Gynecology

## 2018-07-30 DIAGNOSIS — O10919 Unspecified pre-existing hypertension complicating pregnancy, unspecified trimester: Secondary | ICD-10-CM

## 2018-07-30 HISTORY — DX: Unspecified pre-existing hypertension complicating pregnancy, unspecified trimester: O10.919

## 2018-07-30 LAB — GC/CHLAMYDIA PROBE AMP (~~LOC~~) NOT AT ARMC
CHLAMYDIA, DNA PROBE: NEGATIVE
NEISSERIA GONORRHEA: NEGATIVE

## 2018-07-31 ENCOUNTER — Encounter: Payer: Self-pay | Admitting: *Deleted

## 2018-08-09 ENCOUNTER — Other Ambulatory Visit: Payer: Self-pay

## 2018-08-09 ENCOUNTER — Other Ambulatory Visit: Payer: Medicaid Other

## 2018-08-09 DIAGNOSIS — R7301 Impaired fasting glucose: Secondary | ICD-10-CM

## 2018-08-12 ENCOUNTER — Encounter: Payer: Self-pay | Admitting: Family Medicine

## 2018-08-12 ENCOUNTER — Encounter: Payer: Self-pay | Admitting: Obstetrics and Gynecology

## 2018-08-12 ENCOUNTER — Telehealth: Payer: Self-pay | Admitting: *Deleted

## 2018-08-12 DIAGNOSIS — O24419 Gestational diabetes mellitus in pregnancy, unspecified control: Secondary | ICD-10-CM | POA: Insufficient documentation

## 2018-08-12 LAB — GLUCOSE TOLERANCE, 2 HOURS W/ 1HR
GLUCOSE, 1 HOUR: 130 mg/dL (ref 65–179)
GLUCOSE, FASTING: 131 mg/dL — AB (ref 65–91)
Glucose, 2 hour: 97 mg/dL (ref 65–152)

## 2018-08-12 NOTE — Telephone Encounter (Signed)
Called pt and informed her of abnormal 2hr GTT results and need for Diabetes Education appointment. Pt voiced understanding and agreed to appt on 9/19 @ 1400.

## 2018-08-12 NOTE — Telephone Encounter (Signed)
-----   Message from Duane LopeJennifer I Rasch, NP sent at 08/12/2018  4:46 PM EDT ----- This patient failed her 2 hour GTT, can you please get her set up with Diabetes coordinator please?   Thanks,  Victorino DikeJennifer.

## 2018-08-15 ENCOUNTER — Other Ambulatory Visit: Payer: Self-pay

## 2018-08-16 ENCOUNTER — Encounter (HOSPITAL_COMMUNITY): Payer: Self-pay

## 2018-08-23 ENCOUNTER — Ambulatory Visit (HOSPITAL_COMMUNITY)
Admission: RE | Admit: 2018-08-23 | Discharge: 2018-08-23 | Disposition: A | Discharge status: Home / Self Care | Payer: Medicaid Other | Source: Ambulatory Visit | Attending: Advanced Practice Midwife | Admitting: Advanced Practice Midwife

## 2018-08-23 ENCOUNTER — Ambulatory Visit (INDEPENDENT_AMBULATORY_CARE_PROVIDER_SITE_OTHER): Payer: Medicaid Other | Admitting: Family Medicine

## 2018-08-23 VITALS — BP 105/59 | HR 90 | Wt 288.0 lb

## 2018-08-23 DIAGNOSIS — O34219 Maternal care for unspecified type scar from previous cesarean delivery: Secondary | ICD-10-CM | POA: Insufficient documentation

## 2018-08-23 DIAGNOSIS — O24419 Gestational diabetes mellitus in pregnancy, unspecified control: Secondary | ICD-10-CM | POA: Insufficient documentation

## 2018-08-23 DIAGNOSIS — O10919 Unspecified pre-existing hypertension complicating pregnancy, unspecified trimester: Secondary | ICD-10-CM | POA: Diagnosis present

## 2018-08-23 DIAGNOSIS — Z3A19 19 weeks gestation of pregnancy: Secondary | ICD-10-CM | POA: Diagnosis not present

## 2018-08-23 DIAGNOSIS — F322 Major depressive disorder, single episode, severe without psychotic features: Secondary | ICD-10-CM

## 2018-08-23 DIAGNOSIS — O99212 Obesity complicating pregnancy, second trimester: Secondary | ICD-10-CM | POA: Insufficient documentation

## 2018-08-23 DIAGNOSIS — O99210 Obesity complicating pregnancy, unspecified trimester: Secondary | ICD-10-CM | POA: Diagnosis not present

## 2018-08-23 DIAGNOSIS — Z363 Encounter for antenatal screening for malformations: Secondary | ICD-10-CM | POA: Insufficient documentation

## 2018-08-23 DIAGNOSIS — E669 Obesity, unspecified: Secondary | ICD-10-CM | POA: Diagnosis not present

## 2018-08-23 DIAGNOSIS — Z8759 Personal history of other complications of pregnancy, childbirth and the puerperium: Secondary | ICD-10-CM | POA: Diagnosis not present

## 2018-08-23 DIAGNOSIS — Z362 Encounter for other antenatal screening follow-up: Secondary | ICD-10-CM

## 2018-08-23 DIAGNOSIS — Z23 Encounter for immunization: Secondary | ICD-10-CM | POA: Diagnosis not present

## 2018-08-23 DIAGNOSIS — O10012 Pre-existing essential hypertension complicating pregnancy, second trimester: Secondary | ICD-10-CM | POA: Insufficient documentation

## 2018-08-23 DIAGNOSIS — G4733 Obstructive sleep apnea (adult) (pediatric): Secondary | ICD-10-CM

## 2018-08-23 DIAGNOSIS — O0990 Supervision of high risk pregnancy, unspecified, unspecified trimester: Secondary | ICD-10-CM

## 2018-08-23 DIAGNOSIS — O24410 Gestational diabetes mellitus in pregnancy, diet controlled: Secondary | ICD-10-CM | POA: Diagnosis not present

## 2018-08-23 DIAGNOSIS — O10013 Pre-existing essential hypertension complicating pregnancy, third trimester: Secondary | ICD-10-CM

## 2018-08-23 DIAGNOSIS — Z3480 Encounter for supervision of other normal pregnancy, unspecified trimester: Secondary | ICD-10-CM

## 2018-08-23 IMAGING — US US MFM OB DETAIL+14 WK
1 series · 14 of 28 positions shown · non-contrast
Comparison: none

[Series 1: us mfm ob detail+14 wk · 113 acquisitions, 14 frames shown]
[im 5/113]
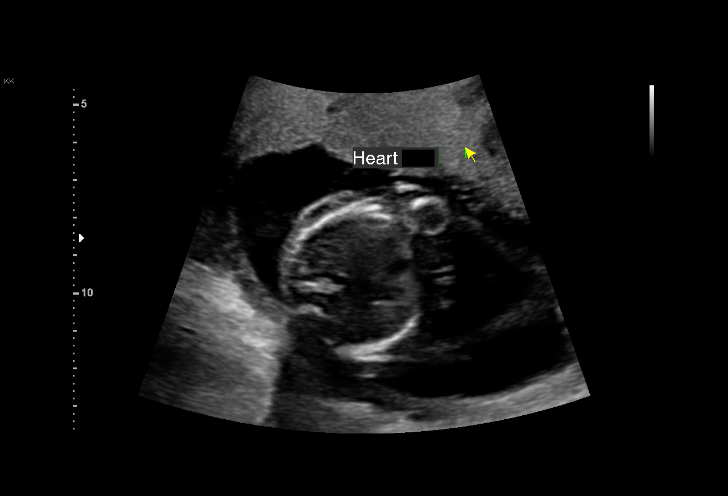
[im 13/113]
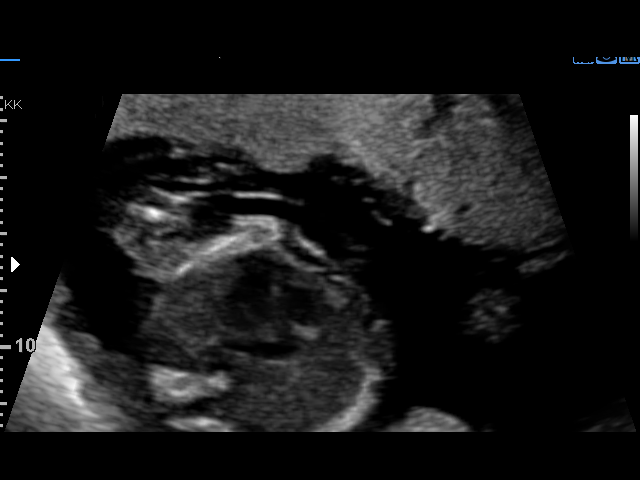
[im 21/113]
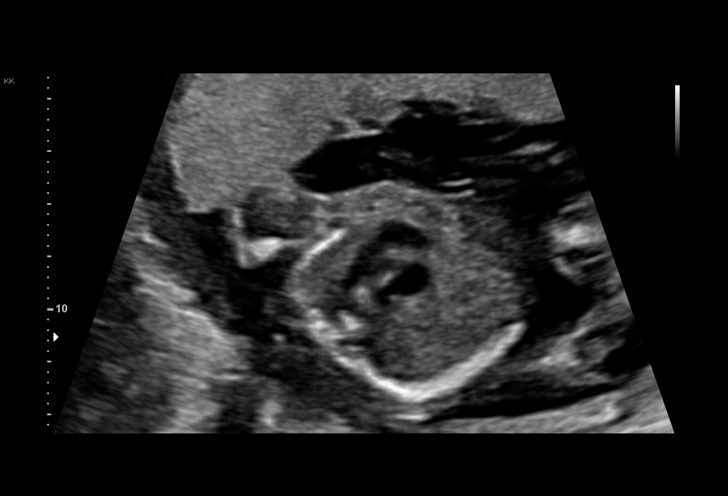
[im 30/113]
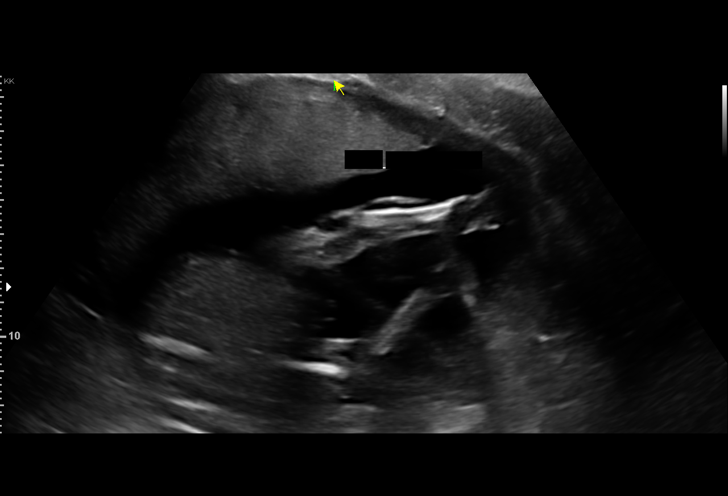
[im 38/113]
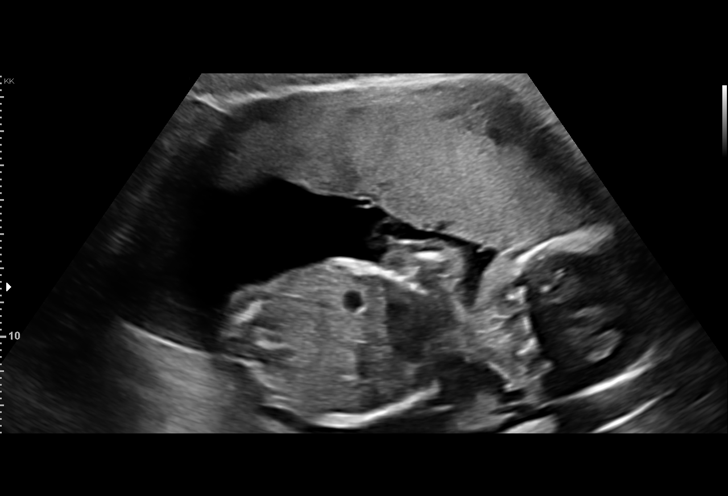
[im 46/113]
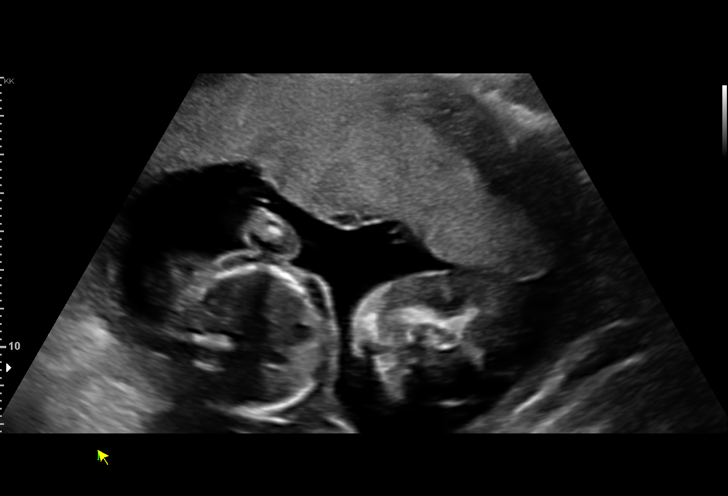
[im 54/113]
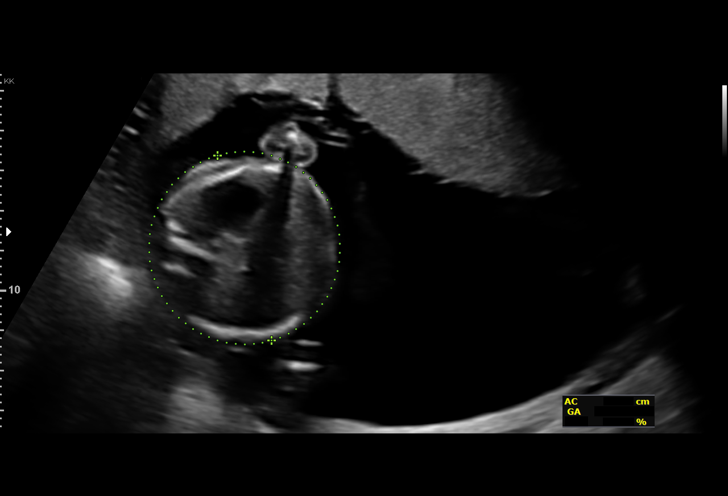
[im 63/113]
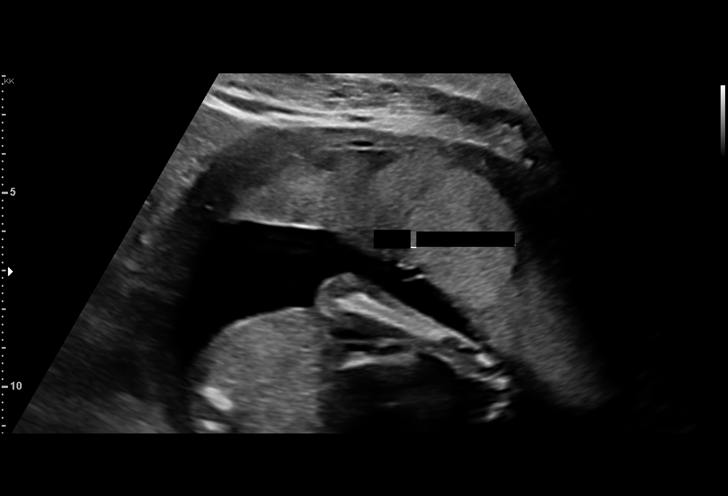
[im 71/113]
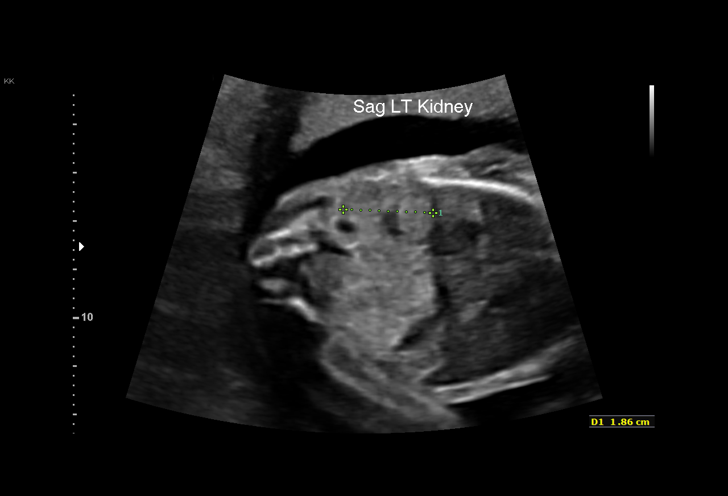
[im 79/113]
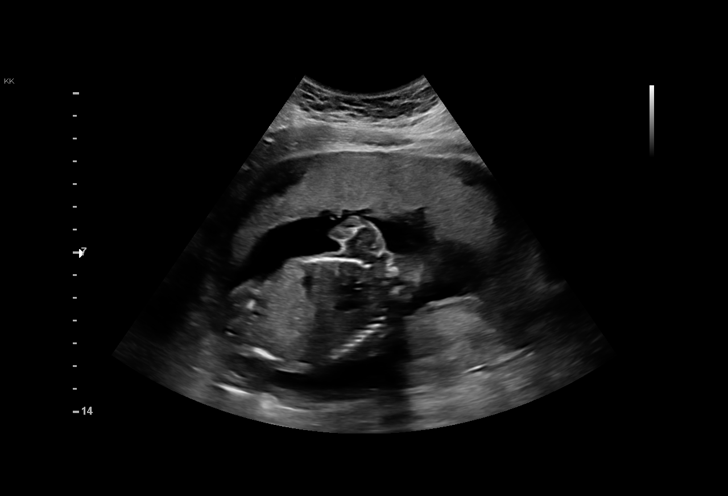
[im 88/113]
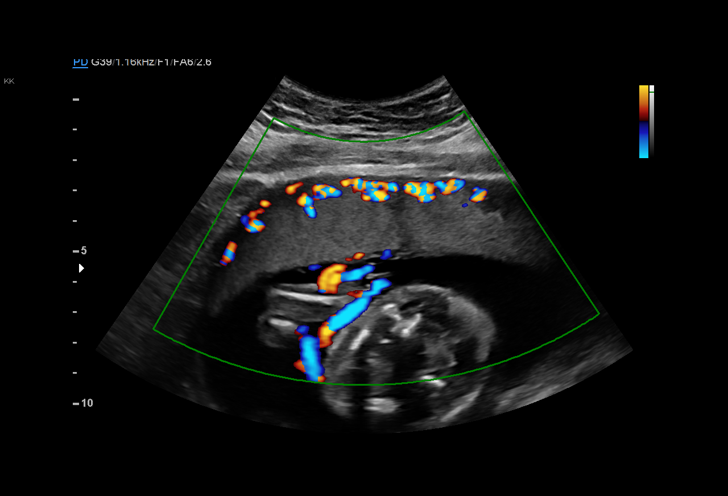
[im 96/113]
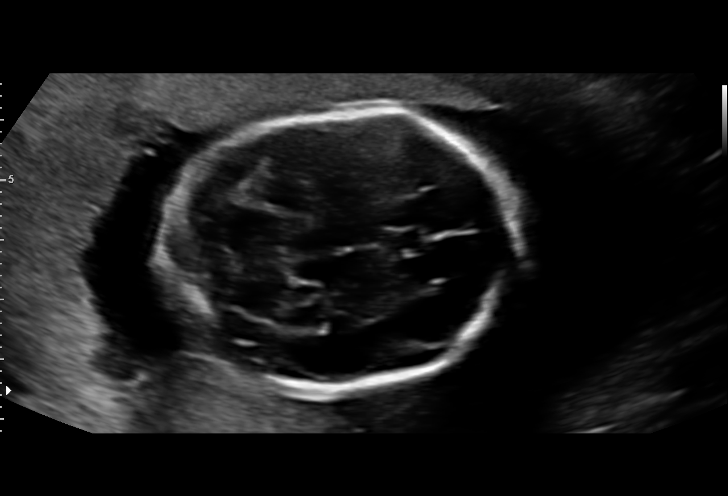
[im 104/113]
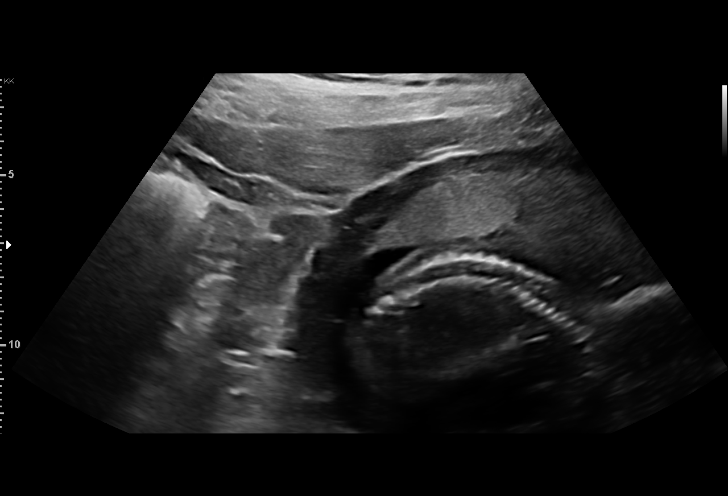
[im 113/113]
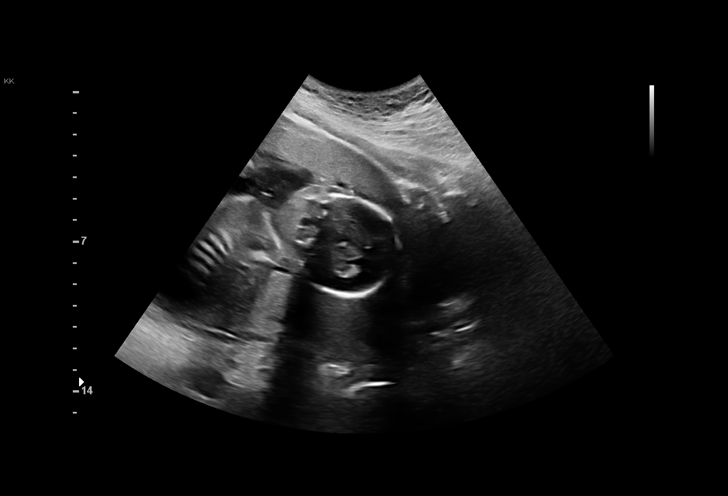

[14 of 28 positions shown; findings below may reference images not displayed]

NOLEN NP

Indications

Hypertension - Chronic/Pre-existing             [GX]
Gestational diabetes in pregnancy,              [GX]
unspecified control
Obesity complicating pregnancy, second          [GX]
trimester
19 weeks gestation of pregnancy
Encounter for antenatal screening for           [GX]
malformations
History of cesarean delivery, currently         [GX]
pregnant
Vital Signs

(lb):
BMI:
Fetal Evaluation

Num Of Fetuses:          1
Fetal Heart              164
Rate(bpm):
Cardiac Activity:        Observed
Presentation:            Cephalic
Placenta:                Anterior
P. Cord Insertion:       Visualized

Amniotic Fluid
AFI FV:      Within normal limits

Largest Pocket(cm)
6.1
Comment:    Persistent LUS contraction unable to determine placental
location in relation to cervical Internal os.
Biometry

BPD:      44.6  mm     G. Age:  19w 3d         34  %    CI:          67.6  %    70 - 86
FL/HC:       18.4  %    16.8 -
HC:      173.6  mm     G. Age:  19w 6d         45  %    HC/AC:       1.11       1.09 -
AC:        157  mm     G. Age:  20w 6d         76  %    FL/BPD:      71.7  %
FL:         32  mm     G. Age:  20w 0d         46  %    FL/AC:       20.4  %    20 - 24
HUM:      31.6  mm     G. Age:  20w 4d         71  %

Est. FW:     348   g    0 lb 12 oz      56  %
m
OB History

Gravidity:    2         Term:   1        Prem:   0         SAB:   0
TOP:          0       Ectopic:  0        Living: 1
Gestational Age

LMP:           15w 0d        Date:  [DATE]                 EDD:    [DATE]
U/S Today:     20w 0d                                        EDD:    [DATE]
Best:          19w 6d     Det. By:  Early Ultrasound         EDD:    [DATE]
([DATE])
Anatomy

Cranium:               Appears normal         Aortic Arch:            Not well visualized
Cavum:                 Appears normal         Ductal Arch:            Not well visualized
Ventricles:            Appears normal         Diaphragm:              Appears normal
Choroid Plexus:        Appears normal         Stomach:                Appears normal,
left sided
Cerebellum:            Appears normal         Abdomen:                Appears normal
Posterior Fossa:       Appears normal         Abdominal Wall:         Appears nml (cord
insert, abd wall)
Nuchal Fold:           Appears normal         Cord Vessels:           Appears normal (3
vessel cord)
Face:                  Not well visualized    Kidneys:                Appear normal
Lips:                  Appears normal         Bladder:                Appears normal
Thoracic:              Appears normal         Spine:                  Not well visualized
Heart:                 Not well visualized    Upper Extremities:      Appears normal
RVOT:                  Appears normal         Lower Extremities:      Appears normal
LVOT:                  Appears normal

Other:  Male gender. Technically difficult due to maternal habitus and fetal
position.
Cervix Uterus Adnexa

Cervix
Length:            3.6  cm.
Normal appearance by transabdominal scan.
Impression

Normal interval growth.  No ultrasonic evidence of structural
fetal anomalies.
Recommendations

Follow up in 4 weeks to complete the anatomy.

## 2018-08-23 MED ORDER — ACCU-CHEK FASTCLIX LANCETS MISC: 1.0000 | Freq: Four times a day (QID) | 12 refills | Status: DC

## 2018-08-23 MED ORDER — SERTRALINE HCL 50 MG PO TABS: 50.0000 mg | ORAL_TABLET | Freq: Every day | ORAL | 3 refills | Status: DC

## 2018-08-23 MED ORDER — GLUCOSE BLOOD VI STRP: ORAL_STRIP | 12 refills | Status: DC

## 2018-08-23 MED ORDER — ACCU-CHEK GUIDE W/DEVICE KIT: 1.0000 | PACK | Freq: Once | 0 refills | Status: AC

## 2018-08-23 MED ORDER — ASPIRIN EC 81 MG PO TBEC: 81.0000 mg | DELAYED_RELEASE_TABLET | Freq: Every day | ORAL | 3 refills | Status: DC

## 2018-08-23 NOTE — Patient Instructions (Signed)

## 2018-08-23 NOTE — Progress Notes (Signed)
Scheduled sleep study 10/28 @ 8pm

## 2018-08-23 NOTE — Progress Notes (Signed)
PRENATAL VISIT NOTE  Subjective:  Alexandra Gray is a 27 y.o. G2P1001 at 40w6dbeing seen today for ongoing prenatal care.  She is currently monitored for the following issues for this low-risk pregnancy and has MDD (major depressive disorder), recurrent severe, without psychosis ([place]; Suicidal ideation; Supervision of high risk pregnancy, antepartum; History of cesarean delivery affecting pregnancy; History of pregnancy induced hypertension; Morbid obesity ([place]; Obesity in pregnancy; Elevated fasting blood sugar; Chronic hypertension during pregnancy, antepartum; Gestational diabetes; Cluster B personality disorder ([place]; PTSD (post-traumatic stress disorder); Sleep apnea; and Severe major depression without psychotic features ([place] on their problem list.  Patient reports depression, had stopped her Zoloft due to pregnancy and would like to get back on this.  Contractions: Irregular.  .  Movement: Present. Denies leaking of fluid.   The following portions of the patient's history were reviewed and updated as appropriate: allergies, current medications, past family history, past medical history, past social history, past surgical history and problem list. Problem list updated.  Objective:   Vitals:   08/23/18 1001  BP: (!) 105/59  Pulse: 90  Weight: 288 lb (130.6 kg)    Fetal Status: Fetal Heart Rate (bpm): 159   Movement: Present     General:  Alert, oriented and cooperative. Patient is in no acute distress.  Skin: Skin is warm and dry. No rash noted.   Cardiovascular: Normal heart rate noted  Respiratory: Normal respiratory effort, no problems with respiration noted  Abdomen: Soft, gravid, appropriate for gestational age.  Pain/Pressure: Present     Pelvic: Cervical exam deferred        Extremities: Normal range of motion.  Edema: Trace  Mental Status: Normal mood and affect. Normal behavior. Normal judgment and thought content.   Assessment and Plan:  Pregnancy: G2P1001 at  159w6d1. Chronic hypertension during pregnancy, antepartum Add ASA No meds and BP is ok today - aspirin EC 81 MG tablet; Take 1 tablet (81 mg total) by mouth daily.  Dispense: 90 tablet; Refill: 3  2. Supervision of high risk pregnancy, antepartum Anatomy u/s today - Flu Vaccine QUAD 36+ mos IM  3. History of cesarean delivery affecting pregnancy Desires RLTCS  4. Obesity in pregnancy On ASA  5. Diet controlled gestational diabetes mellitus (GDM) in second trimester New diagnosis-has to meet with Diabetes education Glucometer + supplies sent in today Discussed diet - Blood Glucose Monitoring Suppl (ACCU-CHEK GUIDE) w/Device KIT; 1 Device by Does not apply route once for 1 dose.  Dispense: 1 kit; Refill: 0 - glucose blood (ACCU-CHEK GUIDE) test strip; Use as instructed QID  Dispense: 100 each; Refill: 12 - ACCU-CHEK FASTCLIX LANCETS MISC; 1 Device by Percutaneous route 4 (four) times daily.  Dispense: 100 each; Refill: 12 - aspirin EC 81 MG tablet; Take 1 tablet (81 mg total) by mouth daily.  Dispense: 90 tablet; Refill: 3  6. History of pregnancy induced hypertension - aspirin EC 81 MG tablet; Take 1 tablet (81 mg total) by mouth daily.  Dispense: 90 tablet; Refill: 3  7. Obstructive sleep apnea syndrome Reports this as people telling her she snores loudly and stops breathing at night. Has not had CPAP or formal testing. Will schedule - Split night study; Future  8. Severe major depression without psychotic features (HCP[place] NeckResume Zoloft. Begin at 25 mg x 3-5 days, then increase dose. Denies suicidality at this time. [facility]. - sertraline (ZOLOFT) 50 MG tablet; Take 1 tablet (50 mg total) by  mouth daily.  Dispense: 90 tablet; Refill: 3  General obstetric precautions including but not limited to vaginal bleeding, contractions, leaking of fluid and fetal movement were reviewed in detail with the patient. Please refer to After Visit Summary for other  counseling recommendations.  No follow-ups on file.  Future Appointments  Date Time Provider [place]  08/29/2018  4:00 PM Reola Calkins [facility]  09/20/2018 10:35 AM Lavonia Drafts, MD [facility]  09/23/2018  8:00 PM [facility] 5 [facility]    Donnamae Jude, MD

## 2018-08-26 ENCOUNTER — Other Ambulatory Visit (HOSPITAL_COMMUNITY): Payer: Self-pay | Admitting: *Deleted

## 2018-08-26 DIAGNOSIS — O10912 Unspecified pre-existing hypertension complicating pregnancy, second trimester: Secondary | ICD-10-CM

## 2018-08-28 ENCOUNTER — Telehealth: Payer: Self-pay | Admitting: Family Medicine

## 2018-08-28 NOTE — Telephone Encounter (Signed)
Called and left patient a VM in regards to her wanting to to reschedule her appt for today @ 4. Left office number to call us back

## 2018-08-29 ENCOUNTER — Other Ambulatory Visit: Payer: Self-pay

## 2018-09-20 ENCOUNTER — Ambulatory Visit (INDEPENDENT_AMBULATORY_CARE_PROVIDER_SITE_OTHER): Payer: Medicaid Other | Admitting: Obstetrics & Gynecology

## 2018-09-20 ENCOUNTER — Telehealth: Payer: Self-pay | Admitting: Emergency Medicine

## 2018-09-20 VITALS — BP 94/55 | HR 90 | Wt 286.8 lb

## 2018-09-20 DIAGNOSIS — F332 Major depressive disorder, recurrent severe without psychotic features: Secondary | ICD-10-CM

## 2018-09-20 DIAGNOSIS — F4310 Post-traumatic stress disorder, unspecified: Secondary | ICD-10-CM

## 2018-09-20 DIAGNOSIS — O34219 Maternal care for unspecified type scar from previous cesarean delivery: Secondary | ICD-10-CM

## 2018-09-20 DIAGNOSIS — Z8759 Personal history of other complications of pregnancy, childbirth and the puerperium: Secondary | ICD-10-CM

## 2018-09-20 DIAGNOSIS — F322 Major depressive disorder, single episode, severe without psychotic features: Secondary | ICD-10-CM

## 2018-09-20 DIAGNOSIS — O99210 Obesity complicating pregnancy, unspecified trimester: Secondary | ICD-10-CM

## 2018-09-20 DIAGNOSIS — O0990 Supervision of high risk pregnancy, unspecified, unspecified trimester: Secondary | ICD-10-CM

## 2018-09-20 DIAGNOSIS — O10919 Unspecified pre-existing hypertension complicating pregnancy, unspecified trimester: Secondary | ICD-10-CM

## 2018-09-20 NOTE — Patient Instructions (Signed)
Vaginal Birth After Cesarean Delivery Vaginal birth after cesarean delivery (VBAC) is giving birth vaginally after previously delivering a baby by a cesarean. In the past, if a woman had a cesarean delivery, all births afterward would be done by cesarean delivery. This is no longer true. It can be safe for the mother to try a vaginal delivery after having a cesarean delivery. It is important to discuss VBAC with your health care provider early in the pregnancy so you can understand the risks, benefits, and options. It will give you time to decide what is best in your particular case. The final decision about whether to have a VBAC or repeat cesarean delivery should be between you and your health care provider. Any changes in your health or your baby's health during your pregnancy may make it necessary to change your initial decision about VBAC. Women who plan to have a VBAC should check with their health care provider to be sure that:  The previous cesarean delivery was done with a low transverse uterine cut (incision) (not a vertical classical incision).  The birth canal is big enough for the baby.  There were no other operations on the uterus.  An electronic fetal monitor (EFM) will be on at all times during labor.  An operating room will be available and ready in case an emergency cesarean delivery is needed.  A health care provider and surgical nursing staff will be available at all times during labor to be ready to do an emergency delivery cesarean if necessary.  An anesthesiologist will be present in case an emergency cesarean delivery is needed.  The nursery is prepared and has adequate personnel and necessary equipment available to care for the baby in case of an emergency cesarean delivery. Benefits of VBAC  Shorter stay in the hospital.  Avoidance of risks associated with cesarean delivery, such as: ? Surgical complications, such as opening of the incision or hernia in the  incision. ? Injury to other organs. ? Fever. This can occur if an infection develops after surgery. It can also occur as a reaction to the medicine given to make you numb during the surgery.  Less blood loss and need for blood transfusions.  Lower risk of blood clots and infection.  Shorter recovery.  Decreased risk for having to remove the uterus (hysterectomy).  Decreased risk for the placenta to completely or partially cover the opening of the uterus (placenta previa) with a future pregnancy.  Decrease risk in future labor and delivery. Risks of a VBAC  Tearing (rupture) of the uterus. This is occurs in less than 1% of VBACs. The risk of this happening is higher if: ? Steps are taken to begin the labor process (induce labor) or stimulate or strengthen contractions (augment labor). ? Medicine is used to soften (ripen) the cervix.  Having to remove the uterus (hysterectomy) if it ruptures. VBAC should not be done if:  The previous cesarean delivery was done with a vertical (classical) or T-shaped incision or you do not know what kind of incision was made.  You had a ruptured uterus.  You have had certain types of surgery on your uterus, such as removal of uterine fibroids. Ask your health care provider about other types of surgeries that prevent you from having a VBAC.  You have certain medical or childbirth (obstetrical) problems.  There are problems with the baby.  You have had two previous cesarean deliveries and no vaginal deliveries. Other facts to know about VBAC:  It   is safe to have an epidural anesthetic with VBAC.  It is safe to turn the baby from a breech position (attempt an external cephalic version).  It is safe to try a VBAC with twins.  VBAC may not be successful if your baby weights 8.8 lb (4 kg) or more. However, weight predictions are not always accurate and should not be used alone to decide if VBAC is right for you.  There is an increased failure rate  if the time between the cesarean delivery and VBAC is less than 19 months.  Your health care provider may advise against a VBAC if you have preeclampsia (high blood pressure, protein in the urine, and swelling of face and extremities).  VBAC is often successful if you previously gave birth vaginally.  VBAC is often successful when the labor starts spontaneously before the due date.  Delivering a baby through a VBAC is similar to having a normal spontaneous vaginal delivery. This information is not intended to replace advice given to you by your health care provider. Make sure you discuss any questions you have with your health care provider. Document Released: 05/06/2007 Document Revised: 04/20/2016 Document Reviewed: 06/12/2013 Elsevier Interactive Patient Education  2018 [employer] Inc[employer]  

## 2018-09-20 NOTE — Telephone Encounter (Signed)
Notified pt regarding fetal echo appointment with Dr. Elizebeth Brooking on 11/7 @ 1:30. Pt was given contact information and address for the office.

## 2018-09-20 NOTE — Progress Notes (Signed)
   PRENATAL VISIT NOTE  Subjective:  Alexandra Gray is a 27 y.o. G2P1001 at [redacted]w[redacted]d being seen today for ongoing prenatal care.  She is currently monitored for the following issues for this high-risk pregnancy and has MDD (major depressive disorder), recurrent severe, without psychosis (HCC); Suicidal ideation; Supervision of high risk pregnancy, antepartum; History of cesarean delivery affecting pregnancy; History of pregnancy induced hypertension; Morbid obesity (HCC); Obesity in pregnancy; Elevated fasting blood sugar; Chronic hypertension during pregnancy, antepartum; Gestational diabetes; Cluster B personality disorder (HCC); PTSD (post-traumatic stress disorder); Sleep apnea; and Severe major depression without psychotic features (HCC) on their problem list.  Patient reports no complaints.  Contractions: Irritability. Vag. Bleeding: None.  Movement: Present. Denies leaking of fluid.   The following portions of the patient's history were reviewed and updated as appropriate: allergies, current medications, past family history, past medical history, past social history, past surgical history and problem list. Problem list updated.  Objective:   Vitals:   09/20/18 1123  BP: (!) 94/55  Pulse: 90  Weight: 286 lb 12.8 oz (130.1 kg)    Fetal Status: Fetal Heart Rate (bpm): 152   Movement: Present     General:  Alert, oriented and cooperative. Patient is in no acute distress.  Skin: Skin is warm and dry. No rash noted.   Cardiovascular: Normal heart rate noted  Respiratory: Normal respiratory effort, no problems with respiration noted  Abdomen: Soft, gravid, appropriate for gestational age.  Pain/Pressure: Present     Pelvic: Cervical exam deferred        Extremities: Normal range of motion.  Edema: Trace  Mental Status: Normal mood and affect. Normal behavior. Normal judgment and thought content.   Assessment and Plan:  Pregnancy: G2P1001 at [redacted]w[redacted]d  1. Supervision of high risk  pregnancy, antepartum  2. Severe major depression without psychotic features (HCC)  3. PTSD (post-traumatic stress disorder)  4. History of cesarean delivery affecting pregnancy Pt declines VBAC. I have counseled her on the risks vs the benefits of TOLAC and have also counseled her that if she has an other c/s she will not be a candidate for a TOLAC. Pt declines.   5. Chronic hypertension during pregnancy, antepartum stable off med  6. Obesity in pregnancy For  7. MDD (major depressive disorder), recurrent severe, without psychosis (HCC)  8. History of pregnancy induced hypertension Baby ASA  Preterm labor symptoms and general obstetric precautions including but not limited to vaginal bleeding, contractions, leaking of fluid and fetal movement were reviewed in detail with the patient. Please refer to After Visit Summary for other counseling recommendations.  Return in about 4 weeks (around 10/18/2018).  Future Appointments  Date Time Provider Department Center  09/27/2018  3:30 PM [facility] [place] [facility]    Willodean Rosenthal, MD

## 2018-09-20 NOTE — Progress Notes (Signed)
Appointment for fetal echo scheduled with Dr. Elizebeth Brooking office at [phone]. Appointment scheduled for 11/7 @ 1:30.

## 2018-09-23 ENCOUNTER — Encounter (HOSPITAL_BASED_OUTPATIENT_CLINIC_OR_DEPARTMENT_OTHER): Payer: Self-pay

## 2018-09-23 ENCOUNTER — Encounter: Payer: Self-pay | Admitting: *Deleted

## 2018-09-23 LAB — POCT URINALYSIS DIP (DEVICE)
Glucose, UA: NEGATIVE mg/dL
Hgb urine dipstick: NEGATIVE
Leukocytes, UA: NEGATIVE
Nitrite: NEGATIVE
PH: 6 (ref 5.0–8.0)
PROTEIN: NEGATIVE mg/dL
SPECIFIC GRAVITY, URINE: 1.025 (ref 1.005–1.030)
UROBILINOGEN UA: 1 mg/dL (ref 0.0–1.0)

## 2018-09-27 ENCOUNTER — Encounter (HOSPITAL_COMMUNITY): Payer: Self-pay

## 2018-09-27 ENCOUNTER — Other Ambulatory Visit (HOSPITAL_COMMUNITY): Payer: Self-pay | Admitting: Maternal & Fetal Medicine

## 2018-09-27 ENCOUNTER — Ambulatory Visit (HOSPITAL_COMMUNITY)
Admission: RE | Admit: 2018-09-27 | Discharge: 2018-09-27 | Disposition: A | Discharge status: Home / Self Care | Payer: Medicaid Other | Source: Ambulatory Visit | Attending: Advanced Practice Midwife | Admitting: Advanced Practice Midwife

## 2018-09-27 DIAGNOSIS — Z363 Encounter for antenatal screening for malformations: Secondary | ICD-10-CM

## 2018-09-27 DIAGNOSIS — O10012 Pre-existing essential hypertension complicating pregnancy, second trimester: Secondary | ICD-10-CM | POA: Diagnosis not present

## 2018-09-27 DIAGNOSIS — O99212 Obesity complicating pregnancy, second trimester: Secondary | ICD-10-CM | POA: Diagnosis not present

## 2018-09-27 DIAGNOSIS — O10912 Unspecified pre-existing hypertension complicating pregnancy, second trimester: Secondary | ICD-10-CM | POA: Insufficient documentation

## 2018-09-27 DIAGNOSIS — O34219 Maternal care for unspecified type scar from previous cesarean delivery: Secondary | ICD-10-CM

## 2018-09-27 DIAGNOSIS — O24410 Gestational diabetes mellitus in pregnancy, diet controlled: Secondary | ICD-10-CM

## 2018-09-27 DIAGNOSIS — O0990 Supervision of high risk pregnancy, unspecified, unspecified trimester: Secondary | ICD-10-CM

## 2018-09-27 DIAGNOSIS — O10919 Unspecified pre-existing hypertension complicating pregnancy, unspecified trimester: Secondary | ICD-10-CM

## 2018-09-27 DIAGNOSIS — Z3A24 24 weeks gestation of pregnancy: Secondary | ICD-10-CM

## 2018-09-27 DIAGNOSIS — O24419 Gestational diabetes mellitus in pregnancy, unspecified control: Secondary | ICD-10-CM

## 2018-09-27 DIAGNOSIS — O99210 Obesity complicating pregnancy, unspecified trimester: Secondary | ICD-10-CM

## 2018-09-27 IMAGING — US US MFM OB TRANSVAGINAL
1 series · 14 of 28 positions shown · non-contrast
Comparison: none

[Series 1: us mfm ob transvaginal · 44 acquisitions, 14 frames shown]
[im 2/44]
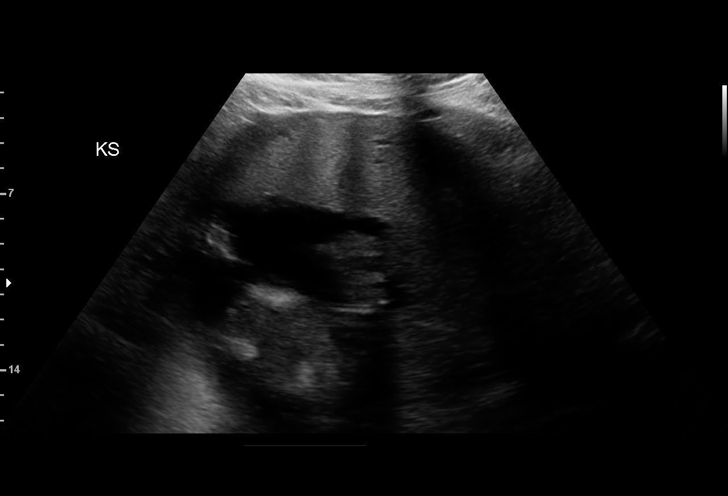
[im 5/44]
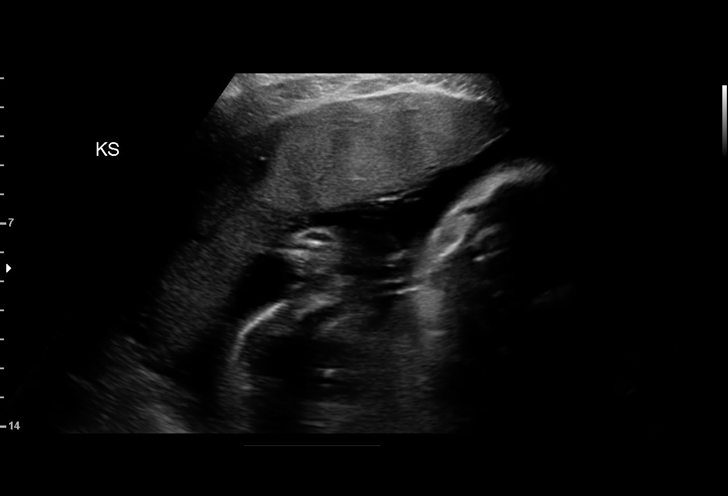
[im 8/44]
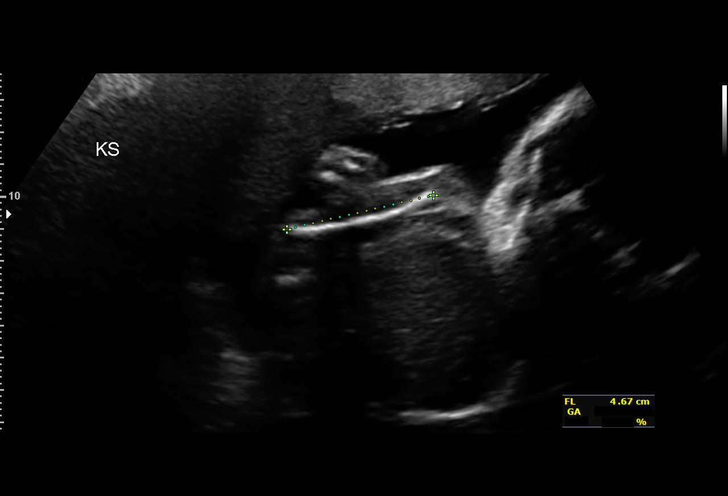
[im 12/44]
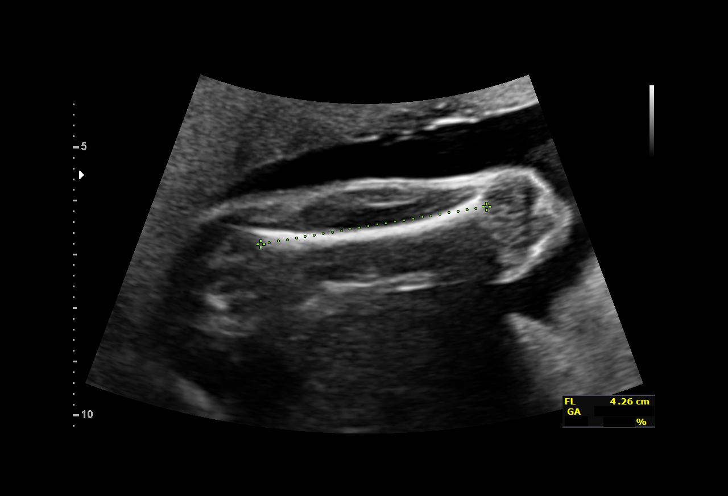
[im 15/44]
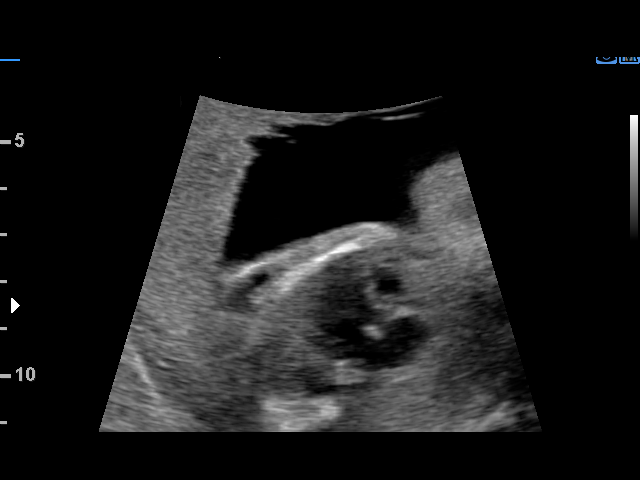
[im 18/44]
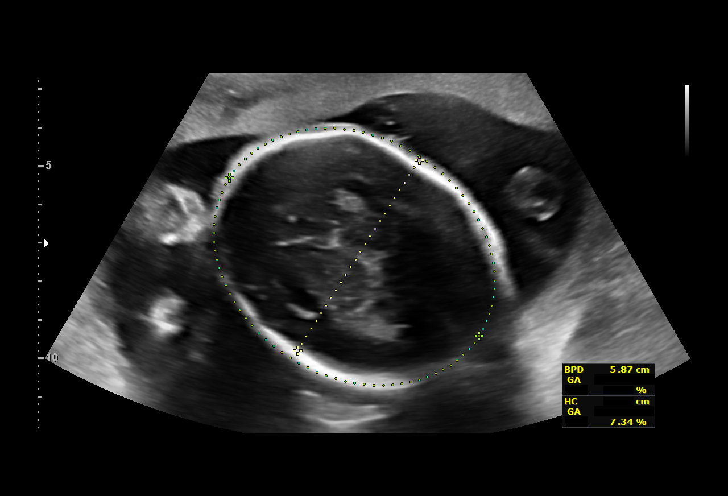
[im 21/44]
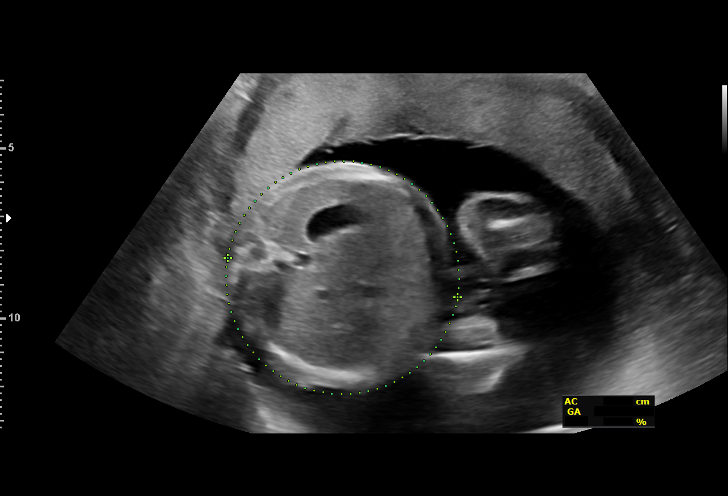
[im 24/44]
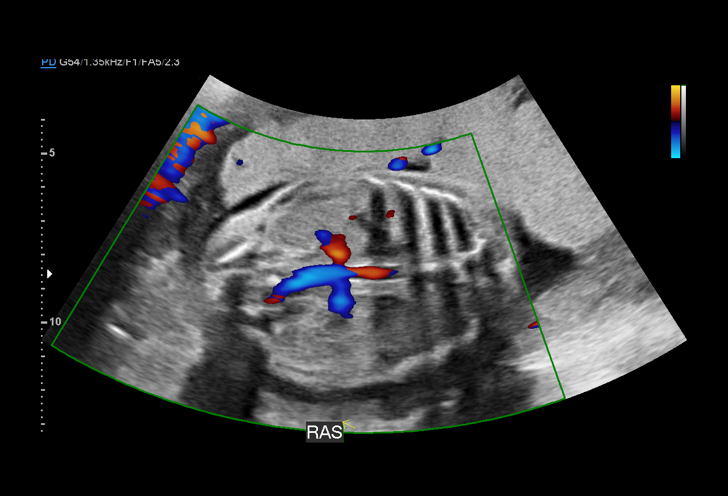
[im 28/44]
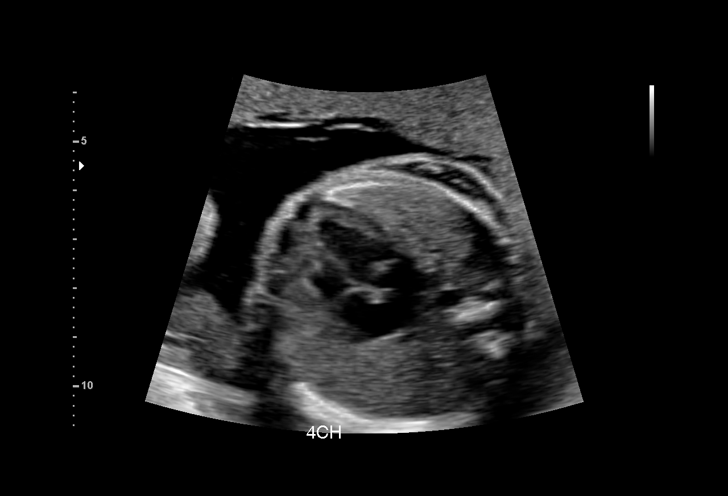
[im 31/44]
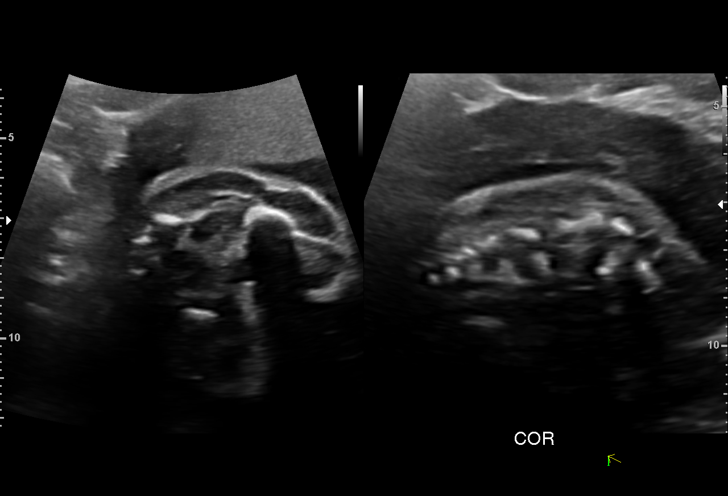
[im 34/44]
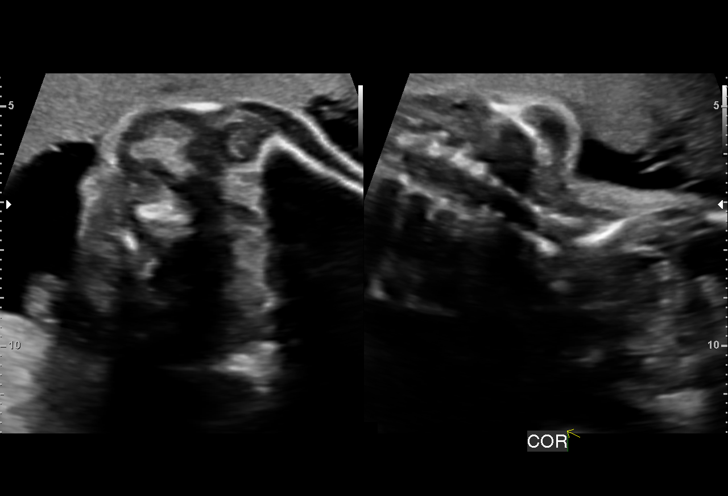
[im 37/44]
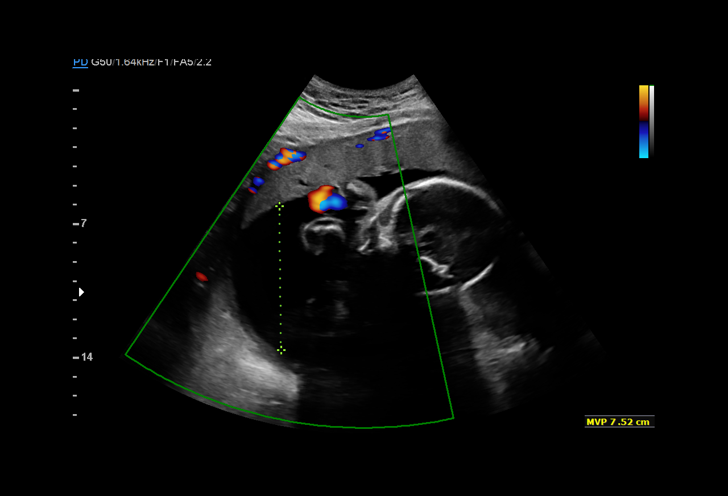
[im 40/44]
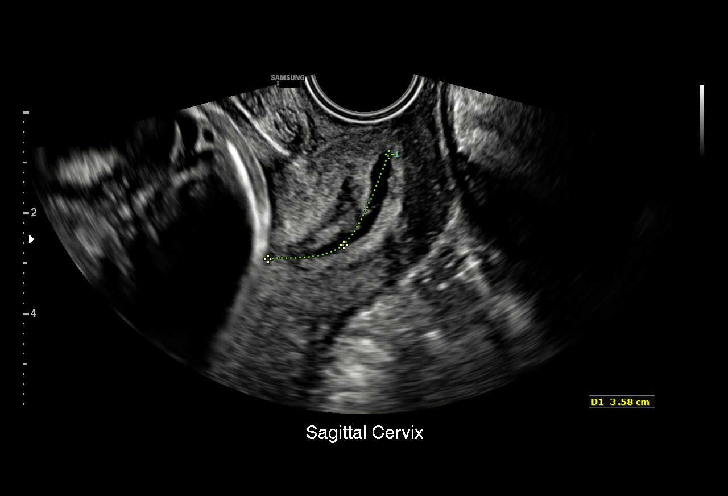
[im 44/44]
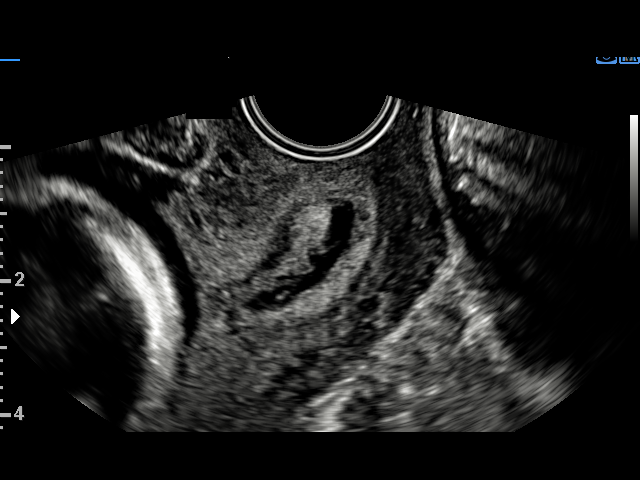

[14 of 28 positions shown; findings below may reference images not displayed]

TIGER NP

TIGER
2  US MFM OB TRANSVAGINAL               76817.2      TIGER
TIGER

Indications

Hypertension - Chronic/Pre-existing            [Z7]
Gestational diabetes in pregnancy,             [Z7]
unspecified control
Obesity complicating pregnancy, second         [Z7]
trimester
Encounter for antenatal screening for          [Z7]
malformations
History of cesarean delivery, currently        [Z7]
pregnant
24 weeks gestation of pregnancy
Encounter for cervical length                  [Z7]
Vital Signs

BMI:
Fetal Evaluation

Num Of Fetuses:         1
Fetal Heart Rate(bpm):  143
Cardiac Activity:       Observed
Presentation:           Transverse, head to maternal left
Placenta:               Anterior
P. Cord Insertion:      Previously Visualized

Amniotic Fluid
AFI FV:      Within normal limits
Largest Pocket(cm)
7.52
Biometry

BPD:      58.7  mm     G. Age:  24w 0d         16  %    CI:        72.49   %    70 - 86
FL/HC:      19.7   %    18.7 -
HC:      219.3  mm     G. Age:  24w 0d          8  %    HC/AC:      1.02        1.04 -
AC:      215.3  mm     G. Age:  26w 0d         76  %    FL/BPD:     73.4   %    71 - 87
FL:       43.1  mm     G. Age:  24w 1d         17  %    FL/AC:      20.0   %    20 - 24
HUM:      40.8  mm     G. Age:  24w 5d         40  %

Est. FW:     762  gm    1 lb 11 oz      56  %
OB History

Gravidity:    2         Term:   1        Prem:   0        SAB:   0
TOP:          0       Ectopic:  0        Living: 1
Gestational Age

LMP:           20w 0d        Date:  [DATE]                 EDD:   [DATE]
U/S Today:     24w 4d                                        EDD:   [DATE]
Best:          24w 6d     Det. By:  Early Ultrasound         EDD:   [DATE]
([DATE])
Anatomy

Cranium:               Appears normal         Aortic Arch:            Not well visualized
Cavum:                 Appears normal         Ductal Arch:            Not well visualized
Ventricles:            Previously seen        Diaphragm:              Previously seen
Choroid Plexus:        Previously seen        Stomach:                Appears normal, left
sided
Cerebellum:            Previously seen        Abdomen:                Appears normal
Posterior Fossa:       Previously seen        Abdominal Wall:         Previously seen
Nuchal Fold:           Previously seen        Cord Vessels:           Previously seen
Face:                  Orbits nl; profile not Kidneys:                Appear normal
well visualized
Lips:                  Previously seen        Bladder:                Appears normal
Thoracic:              Appears normal         Spine:                  Not well visualized
Heart:                 Appears normal         Upper Extremities:      Previously seen
(4CH, axis, and situs
RVOT:                  Previously seen        Lower Extremities:      Previously seen
LVOT:                  Previously seen

Other:  Male gender. Technically difficult due to maternal habitus and fetal
position.
Cervix Uterus Adnexa

Cervix
Length:           3.48  cm.
Measured transvaginally.
Impression

Patient with chronic hypertension (no antihypertensives)
returned for completion of fetal anatomy. She has gestational
diabetes that is well-controlled on diet. Patient reports having
intermittent uterine contractions.
Fetal growth is appropriate for gestational age. Amniotic fluid
is normal and good fetal activity is seen. 4-chamber view,
arches and spine could still not be evaluated because of fetal
position.
We performed transvaginal ultrasound to evaluate the cervix.
The cervix measures 3.5 cm, which is normal. No shortening
or funneling was seen on suprapubic or transfundal pressure.
We reassured the patient of the findings.
BP at our office: 124/94 and 129/78 mm Hg (repeat).
Recommendations

An appointment was made for her to return in 4 weeks for
fetal growth assessment.

## 2018-09-30 ENCOUNTER — Other Ambulatory Visit (HOSPITAL_COMMUNITY): Payer: Self-pay | Admitting: *Deleted

## 2018-09-30 DIAGNOSIS — O10913 Unspecified pre-existing hypertension complicating pregnancy, third trimester: Secondary | ICD-10-CM

## 2018-10-01 ENCOUNTER — Encounter: Payer: Self-pay | Admitting: Advanced Practice Midwife

## 2018-10-04 ENCOUNTER — Inpatient Hospital Stay (HOSPITAL_COMMUNITY)
Admission: AD | Admit: 2018-10-04 | Discharge: 2018-10-04 | Disposition: A | Discharge status: Home / Self Care | Payer: Medicaid Other | Source: Ambulatory Visit | Attending: Obstetrics and Gynecology | Admitting: Obstetrics and Gynecology

## 2018-10-04 ENCOUNTER — Encounter (HOSPITAL_COMMUNITY): Payer: Self-pay | Admitting: *Deleted

## 2018-10-04 ENCOUNTER — Other Ambulatory Visit: Payer: Self-pay

## 2018-10-04 DIAGNOSIS — O26892 Other specified pregnancy related conditions, second trimester: Secondary | ICD-10-CM | POA: Diagnosis not present

## 2018-10-04 DIAGNOSIS — O10919 Unspecified pre-existing hypertension complicating pregnancy, unspecified trimester: Secondary | ICD-10-CM

## 2018-10-04 DIAGNOSIS — O99212 Obesity complicating pregnancy, second trimester: Secondary | ICD-10-CM | POA: Diagnosis not present

## 2018-10-04 DIAGNOSIS — R42 Dizziness and giddiness: Secondary | ICD-10-CM

## 2018-10-04 DIAGNOSIS — Z3A25 25 weeks gestation of pregnancy: Secondary | ICD-10-CM | POA: Diagnosis not present

## 2018-10-04 DIAGNOSIS — E669 Obesity, unspecified: Secondary | ICD-10-CM | POA: Insufficient documentation

## 2018-10-04 DIAGNOSIS — O0990 Supervision of high risk pregnancy, unspecified, unspecified trimester: Secondary | ICD-10-CM

## 2018-10-04 DIAGNOSIS — O10012 Pre-existing essential hypertension complicating pregnancy, second trimester: Secondary | ICD-10-CM | POA: Diagnosis not present

## 2018-10-04 DIAGNOSIS — O24410 Gestational diabetes mellitus in pregnancy, diet controlled: Secondary | ICD-10-CM | POA: Diagnosis not present

## 2018-10-04 DIAGNOSIS — O99210 Obesity complicating pregnancy, unspecified trimester: Secondary | ICD-10-CM

## 2018-10-04 DIAGNOSIS — Z7982 Long term (current) use of aspirin: Secondary | ICD-10-CM | POA: Insufficient documentation

## 2018-10-04 LAB — CBC
HCT: 39.2 % (ref 36.0–46.0)
Hemoglobin: 12.7 g/dL (ref 12.0–15.0)
MCH: 27.2 pg (ref 26.0–34.0)
MCHC: 32.4 g/dL (ref 30.0–36.0)
MCV: 83.9 fL (ref 80.0–100.0)
PLATELETS: 229 10*3/uL (ref 150–400)
RBC: 4.67 MIL/uL (ref 3.87–5.11)
RDW: 14.5 % (ref 11.5–15.5)
WBC: 9 10*3/uL (ref 4.0–10.5)
nRBC: 0 % (ref 0.0–0.2)

## 2018-10-04 LAB — URINALYSIS, ROUTINE W REFLEX MICROSCOPIC
Bilirubin Urine: NEGATIVE
GLUCOSE, UA: NEGATIVE mg/dL
HGB URINE DIPSTICK: NEGATIVE
KETONES UR: 5 mg/dL — AB
NITRITE: NEGATIVE
PROTEIN: 30 mg/dL — AB
Specific Gravity, Urine: 1.024 (ref 1.005–1.030)
pH: 6 (ref 5.0–8.0)

## 2018-10-04 LAB — GLUCOSE, CAPILLARY: GLUCOSE-CAPILLARY: 87 mg/dL (ref 70–99)

## 2018-10-04 NOTE — Discharge Instructions (Signed)
Gestational Diabetes Mellitus, Self Care When you have gestational diabetes (gestational diabetes mellitus), you must keep your blood sugar (glucose) under control. You can do this with:  Nutrition.  Exercise.  Lifestyle changes.  Medicines or insulin, if needed.  Support from your doctors and others.  How do I manage my blood sugar?  Check your blood sugar every day during pregnancy. Check it as often as told.  Call your doctor if your blood sugar is above your goal numbers for 2 tests in a row. Your doctor will set treatment goals for you. Try to have these blood sugars:  After not eating for a long time (after fasting): at or below 95 mg/dL (5.3 mmol/L).  After meals (postprandial): ? One hour after a meal: at or below 140 mg/dL (7.8 mmol/L). ? Two hours after a meal: at or below 120 mg/dL (6.7 mmol/L).  A1c (hemoglobin A1c) level: 6-6.5%.  What do I need to know about high blood sugar? High blood sugar is called hyperglycemia. Know the early signs of high blood sugar. Signs include:  Feeling: ? Thirsty. ? Hungry. ? Very tired.  Needing to pee (urinate) more than usual.  Blurry vision.  What do I need to know about low blood sugar? Low blood sugar is called hypoglycemia. This is when blood sugar is at or below 70 mg/dL (3.9 mmol/L). Symptoms may include:  Feeling: ? Hungry. ? Worried or nervous (anxious). ? Sweaty or clammy. ? Confused. ? Dizzy. ? Sleepy. ? Sick to your stomach (nauseous).  Having: ? A fast heartbeat. ? A headache. ? A change in vision. ? Jerky movements that you cannot control (seizure). ? Nightmares. ? Tingling or no feeling (numbness) around the mouth, lips, or tongue.  Having trouble with: ? Talking. ? Paying attention (concentrating). ? Moving (coordination). ? Sleeping.  Shaking.  Passing out (fainting).  Getting upset easily (irritability).  Treating low blood sugar  To treat low blood sugar, eat or drink something  sugary right away. If you can think clearly and swallow safely, follow the 15:15 rule:  Take 15 grams of a fast-acting carb (carbohydrate). Some fast-acting carbs are: ? 1 tube of glucose gel. ? 3 sugar tablets (glucose pills). ? 6-8 pieces of hard candy. ? 4 oz (120 mL) of fruit juice. ? 4 oz (120 mL) regular (not diet) soda.  Check your blood sugar 15 minutes after you take the carb.  If your blood sugar is still at or below 70 mg/dL (3.9 mmol/L), take 15 grams of a carb again.  If your blood sugar does not go above 70 mg/dL (3.9 mmol/L) after 3 tries, get help right away.  After your blood sugar goes back to normal, eat a meal or a snack within 1 hour.  Treating very low blood sugar If your blood sugar is at or below 54 mg/dL (3 mmol/L), you have very low blood sugar (severe hypoglycemia). This is an emergency. Do not wait to see if the symptoms will go away. Get medical help right away. Call your local emergency services (911 in the [place] Do not drive yourself to the hospital. If you have very low blood sugar and you cannot eat or drink, you may need a glucagon shot (injection). A family member or friend should learn:  How to check your blood sugar.  How to give you a glucagon shot.  Ask your doctor if you need a glucagon shot kit at home. What else is important to manage my diabetes? Medicine  Take your insulin and diabetes medicines as told.  Do not run out of insulin or medicines.  Adjust your insulin and diabetes medicines as told. Food   Make healthy food choices. These include: ? Chicken, fish, egg whites, and beans. ? Oats, whole wheat, bulgur, brown rice, quinoa, and millet. ? Fresh fruits and vegetables. ? Low-fat dairy products. ? Nuts, avocado, olive oil, and canola oil.  Make an eating plan. A food specialist (dietitian) can help you.  Follow instructions from your doctor about what you cannot eat or drink.  Drink enough fluid to keep your pee (urine)  clear or pale yellow.  Eat healthy snacks between healthy meals.  Keep track of carbs you eat. Read food labels. Learn about food serving sizes.  Follow your sick day plan when you cannot eat or drink normally. Make this plan with your doctor. Activity  Exercise 30 minutes or more a day or as much as told by your doctor.  Talk with your doctor if you start a new exercise. Your doctor may need to adjust your insulin, medicines, or food. Lifestyle  Do not drink alcohol.  Do not use any tobacco products. If you need help quitting, ask your doctor.  Learn how to deal with stress. If you need help with this, ask your doctor. Body care   Stay up to date with your shots (immunizations).  Brush your teeth and gums two times a day. Floss at least one time a day.  Go to the dentist least one time every 6 months.  Stay at a healthy weight while you are pregnant. General instructions  Take over-the-counter and prescription medicines only as told by your doctor.  Ask your doctor about risks of high blood pressure in pregnancy. These are called preeclampsia and eclampsia.  Share your diabetes care plan with: ? Your work or school. ? People you live with.  Check your pee for ketones: ? When you are sick. ? As told by your doctor.  Ask your doctor: ? Do I need to meet with a diabetes educator? ? Where can I find a support group for people with diabetes?  Carry a card or wear jewelry that says that you have diabetes.  Keep all follow-up visits with your doctor. This is important. Care after giving birth   Have your blood sugar checked 4-12 weeks after you give birth.  Get checked for diabetes at least every 3 years. Where to find more information: To learn more about diabetes, visit:  American Diabetes Association: www.diabetes.org/diabetes-basics/gestational  Centers for Disease Control and Prevention (CDC): www.cdc.gov/diabetes/pubs/pdf/gestationalDiabetes.pdf  This  information is not intended to replace advice given to you by your health care provider. Make sure you discuss any questions you have with your health care provider. Document Released: 03/06/2016 Document Revised: 04/20/2016 Document Reviewed: 12/17/2015 Elsevier Interactive Patient Education  2018 [employer] Inc.  

## 2018-10-04 NOTE — MAU Note (Signed)
Whole lot of swelling in legs and feet, past 4 days.  Has been feeling really light headed.  Denies HA, vision will blur at times, denies epigastric pain. Pan from low back to lower abd, hurts really bad, tighteness

## 2018-10-04 NOTE — MAU Provider Note (Signed)
History   Patient Alexandra Gray is a 27 y.o. G2P1001 At [redacted]w[redacted]d here with complaints of lightheadedness and swelling in her extremities. She denies HA; sometimes her vision is blurry. She denies pain in her RUQ. Patient has a new diagnosis of Gestational diabetes; she is checking her sugars and says that they range between 149-188.  She has seen diabetic educator once but has not been started on medicine.  She has an extensive list of complaints today: back pain, abdominal pain, dizziness, swelling in her feet.   CSN: [id number]  Arrival date and time: 10/04/18 1355   First Provider Initiated Contact with Patient 10/04/18 1531      Chief Complaint  Patient presents with  . Leg Swelling  . Abdominal Pain  . Back Pain   Abdominal Pain  This is a chronic problem. The problem occurs intermittently. The pain is located in the LLQ and RLQ. The pain is at a severity of 8/10. The quality of the pain is sharp. Nothing aggravates the pain. The pain is relieved by nothing.  Back Pain  This is a new problem. The pain is present in the lumbar spine. The quality of the pain is described as aching. Associated symptoms include abdominal pain.  Dizziness  This is a new problem. The current episode started in the past 7 days. Associated symptoms include abdominal pain.  Today she has had cereal, [employer], salad for lunch.  She feels like she eats a good diet, but her blood sugar is still high. Her mom is a Engineer, civil (consulting) and makes sure she eats healthy. Yesterday she had oatmeal for breakfast, plus fruit, for lunch she had peanut butter crackers and fruit. For dinner she had salad and apple sauce.  OB History    Gravida  2   Para  1   Term  1   Preterm      AB      Living  1     SAB      TAB      Ectopic      Multiple  0   Live Births  1           Past Medical History:  Diagnosis Date  . Chlamydia   . Depression    doing ok now  . Gonorrhea   . Hx of trichomoniasis   .  Hypertension    sometimes is up, never on meds  . Mental disorder   . Ovarian cyst   . PID (pelvic inflammatory disease)   . Sleep apnea     Past Surgical History:  Procedure Laterality Date  . CESAREAN SECTION N/A 10/03/2015   Procedure: CESAREAN SECTION;  Surgeon: Tilda Burrow, MD;  Location: [facility];  Service: Obstetrics;  Laterality: N/A;  . KNEE SURGERY      Family History  Problem Relation Age of Onset  . Healthy Mother   . Healthy Father   . Healthy Brother   . Cancer Neg Hx   . Diabetes Neg Hx   . Heart disease Neg Hx   . Hypertension Neg Hx   . Stroke Neg Hx   . Hearing loss Neg Hx     Social History   Tobacco Use  . Smoking status: Never Smoker  . Smokeless tobacco: Never Used  Substance Use Topics  . Alcohol use: No  . Drug use: No    Allergies:  Allergies  Allergen Reactions  . Nubain [Nalbuphine Hcl]  Makes patient hot    Medications Prior to Admission  Medication Sig Dispense Refill Last Dose  . ACCU-CHEK FASTCLIX LANCETS MISC 1 Device by Percutaneous route 4 (four) times daily. 100 each 12 Taking  . aspirin EC 81 MG tablet Take 1 tablet (81 mg total) by mouth daily. 90 tablet 3 Taking  . butalbital-acetaminophen-caffeine (FIORICET, ESGIC) 50-325-40 MG tablet Take 1-2 tablets by mouth every 6 (six) hours as needed for headache. 20 tablet 2 Taking  . cyclobenzaprine (FLEXERIL) 10 MG tablet Take 1 tablet (10 mg total) by mouth 2 (two) times daily as needed for muscle spasms. 20 tablet 0 Taking  . glucose blood (ACCU-CHEK GUIDE) test strip Use as instructed QID 100 each 12 Taking  . lidocaine (XYLOCAINE) 2 % solution Use as directed 15 mLs in the mouth or throat as needed for mouth pain. 100 mL 2 Taking  . Prenatal Vit-Fe Fumarate-FA (MULTIVITAMIN-PRENATAL) 27-0.8 MG TABS tablet Take 1 tablet by mouth daily at 12 noon.   Taking  . promethazine (PHENERGAN) 25 MG tablet Take 1 tablet (25 mg total) by mouth every 6 (six) hours as needed for nausea or  vomiting. 30 tablet 1 Taking  . sertraline (ZOLOFT) 50 MG tablet Take 1 tablet (50 mg total) by mouth daily. 90 tablet 3 Taking    Review of Systems  Constitutional: Negative.   HENT: Negative.   Eyes: Negative.   Gastrointestinal: Positive for abdominal pain.  Genitourinary: Negative.   Musculoskeletal: Positive for back pain.  Neurological: Positive for dizziness.  Hematological: Negative.   Psychiatric/Behavioral: Negative.    Physical Exam   Blood pressure 135/83, pulse (!) 101, temperature 99.2 F (37.3 C), temperature source Oral, resp. rate 20, weight 131.1 kg, last menstrual period 05/10/2018, currently breastfeeding.  Physical Exam  Constitutional: She appears well-developed and well-nourished.    MAU Course  Procedures  MDM -bedside POCT  was 88 -CBC normal, no signs of anemia.  -pre-e labs not done as elevated BP was with the wrong sized cuff. Otherwise all BPS normal and patient denies s/s of pre-e at this time.  -patient given crackers and ginger ale -NST; 140 bpm, mod var, present acel, no decels, no contractions.  Assessment and Plan   1. Dizziness   2. Diet controlled gestational diabetes mellitus (GDM) in second trimester   3. Chronic hypertension during pregnancy, antepartum   4. Obesity in pregnancy   5. Supervision of high risk pregnancy, antepartum    -Note sent to clinic for patient to see Bev on Tuesday or Thursday to discuss her sugars; emphasized patient needs to bring log -encouraged patient to eat more protein as it will not elevate her blood sugar and it will help her not feel so weak.  -extensive taeching on diet, s/s of pre-e. Reviewed that we do not start medication until BP is constienly 150s/upper 90s.   - Discussed patient's complaints, blood sugars, diet, and blood pressures with Dr. Jolayne Panther. Dr. Jolayne Panther agrees it is appropriate for patient to see Bev on Tuesday and not necessary to see MD. Does not recommend starting anti-hypertensive  at this time.   -Plan of care reviewed with patient, who agrees.   Charlesetta Garibaldi Kooistra 10/04/2018, 3:48 PM

## 2018-10-10 ENCOUNTER — Other Ambulatory Visit: Payer: Self-pay

## 2018-10-15 ENCOUNTER — Other Ambulatory Visit: Payer: Self-pay

## 2018-10-18 ENCOUNTER — Encounter: Payer: Self-pay | Admitting: Obstetrics and Gynecology

## 2018-10-18 ENCOUNTER — Encounter: Payer: Self-pay | Admitting: Obstetrics & Gynecology

## 2018-10-23 ENCOUNTER — Encounter: Payer: Self-pay | Admitting: Obstetrics and Gynecology

## 2018-10-23 ENCOUNTER — Telehealth: Payer: Self-pay | Admitting: Obstetrics & Gynecology

## 2018-10-23 ENCOUNTER — Encounter: Payer: Self-pay | Admitting: Obstetrics & Gynecology

## 2018-10-23 NOTE — Telephone Encounter (Signed)
Called patient to reschedule her missed appointment. Was not able to leave a VM, will mail out a letter.

## 2018-10-28 ENCOUNTER — Ambulatory Visit (INDEPENDENT_AMBULATORY_CARE_PROVIDER_SITE_OTHER): Payer: Medicaid Other | Admitting: Family Medicine

## 2018-10-28 ENCOUNTER — Encounter (HOSPITAL_COMMUNITY): Payer: Self-pay

## 2018-10-28 ENCOUNTER — Ambulatory Visit (HOSPITAL_COMMUNITY)
Admission: RE | Admit: 2018-10-28 | Discharge: 2018-10-28 | Disposition: A | Discharge status: Home / Self Care | Payer: Medicaid Other | Source: Ambulatory Visit | Attending: Advanced Practice Midwife | Admitting: Advanced Practice Midwife

## 2018-10-28 VITALS — BP 128/80 | Wt 291.2 lb

## 2018-10-28 DIAGNOSIS — O99213 Obesity complicating pregnancy, third trimester: Secondary | ICD-10-CM | POA: Insufficient documentation

## 2018-10-28 DIAGNOSIS — Z3A29 29 weeks gestation of pregnancy: Secondary | ICD-10-CM | POA: Insufficient documentation

## 2018-10-28 DIAGNOSIS — O10913 Unspecified pre-existing hypertension complicating pregnancy, third trimester: Secondary | ICD-10-CM

## 2018-10-28 DIAGNOSIS — Z8759 Personal history of other complications of pregnancy, childbirth and the puerperium: Secondary | ICD-10-CM

## 2018-10-28 DIAGNOSIS — O99210 Obesity complicating pregnancy, unspecified trimester: Secondary | ICD-10-CM

## 2018-10-28 DIAGNOSIS — O0993 Supervision of high risk pregnancy, unspecified, third trimester: Secondary | ICD-10-CM

## 2018-10-28 DIAGNOSIS — O24419 Gestational diabetes mellitus in pregnancy, unspecified control: Secondary | ICD-10-CM | POA: Diagnosis not present

## 2018-10-28 DIAGNOSIS — O24410 Gestational diabetes mellitus in pregnancy, diet controlled: Secondary | ICD-10-CM

## 2018-10-28 DIAGNOSIS — O10013 Pre-existing essential hypertension complicating pregnancy, third trimester: Secondary | ICD-10-CM

## 2018-10-28 DIAGNOSIS — O0990 Supervision of high risk pregnancy, unspecified, unspecified trimester: Secondary | ICD-10-CM

## 2018-10-28 DIAGNOSIS — O10919 Unspecified pre-existing hypertension complicating pregnancy, unspecified trimester: Secondary | ICD-10-CM

## 2018-10-28 DIAGNOSIS — Z23 Encounter for immunization: Secondary | ICD-10-CM

## 2018-10-28 DIAGNOSIS — O34219 Maternal care for unspecified type scar from previous cesarean delivery: Secondary | ICD-10-CM | POA: Insufficient documentation

## 2018-10-28 IMAGING — US US MFM OB FOLLOW-UP
1 series · 14 of 28 positions shown · non-contrast
Comparison: none

[Series 1: us mfm ob follow-up · 14 of 38 slices shown]
[im 2/38]
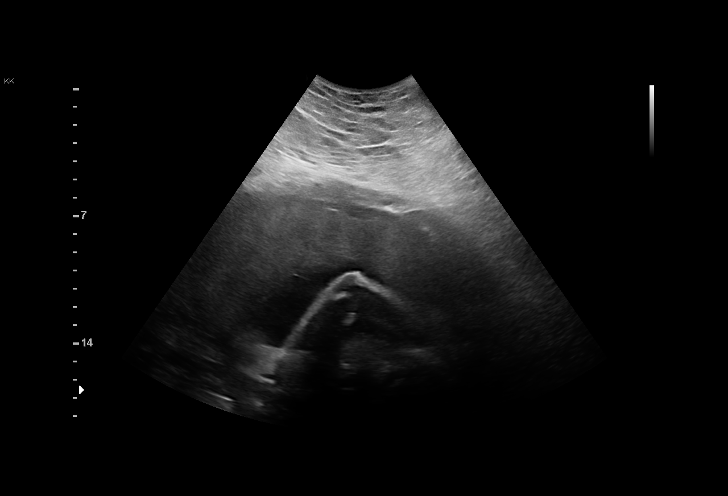
[im 5/38]
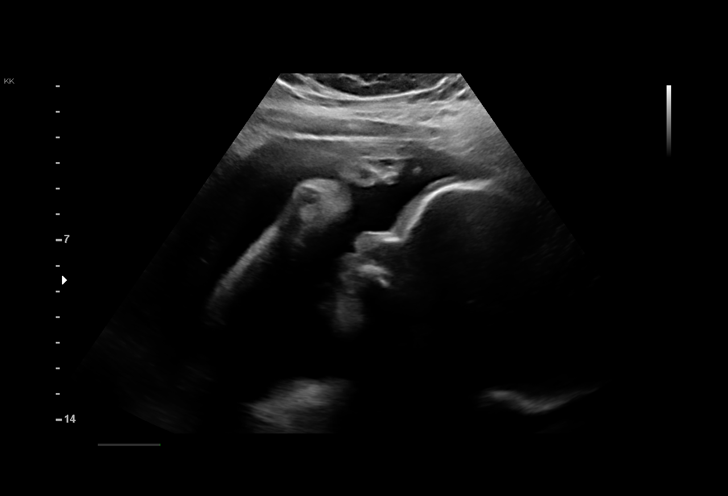
[im 7/38]
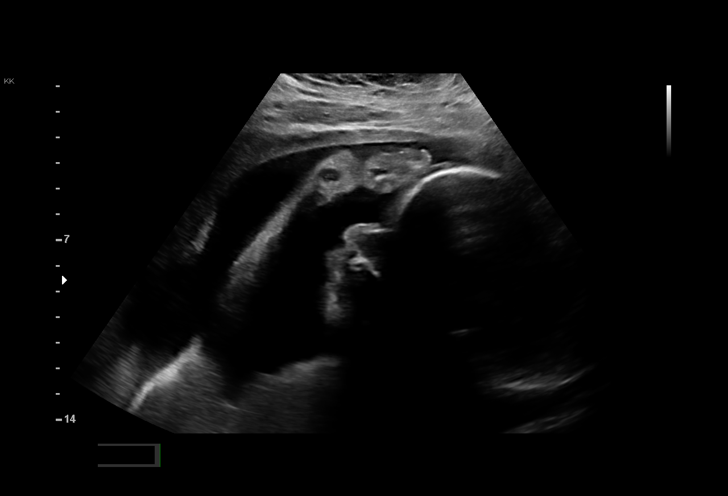
[im 10/38]
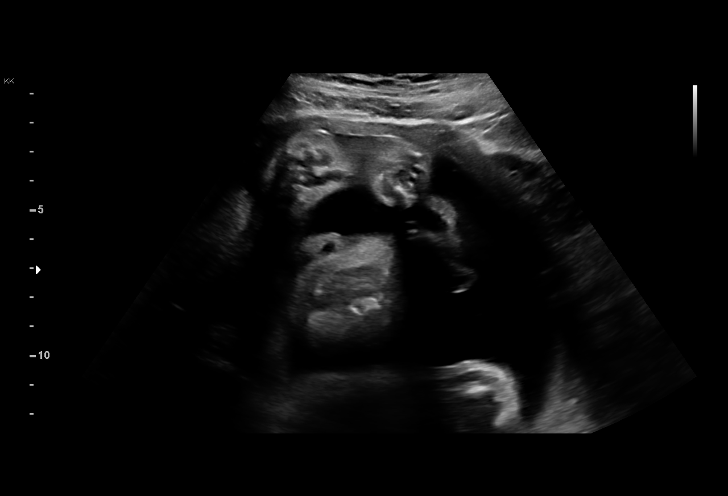
[im 13/38]
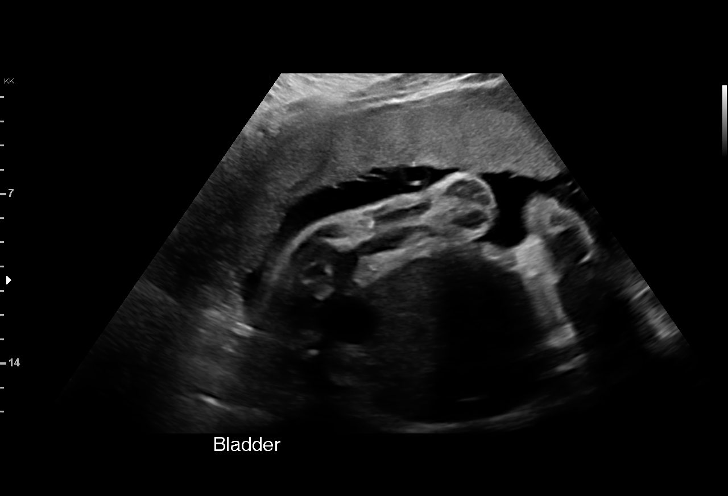
[im 16/38]
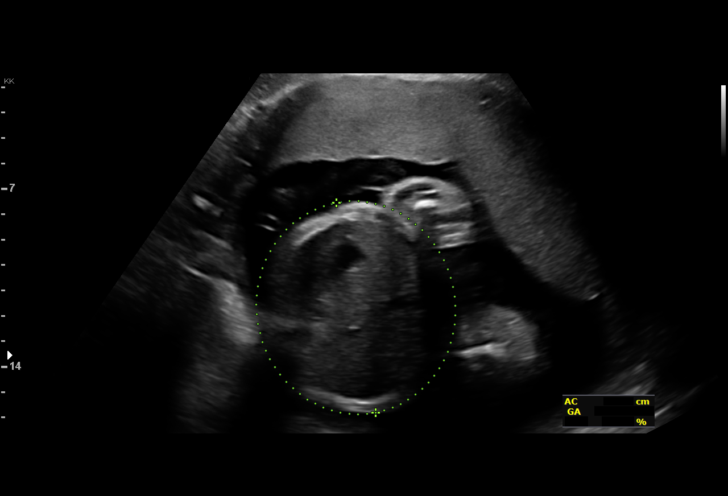
[im 18/38]
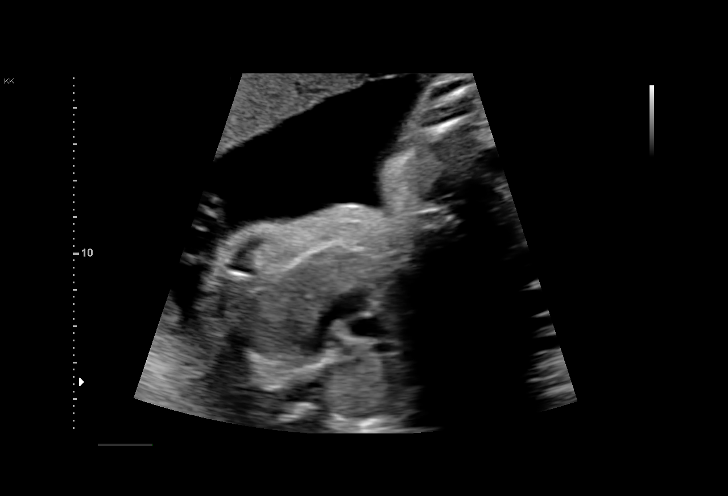
[im 21/38]
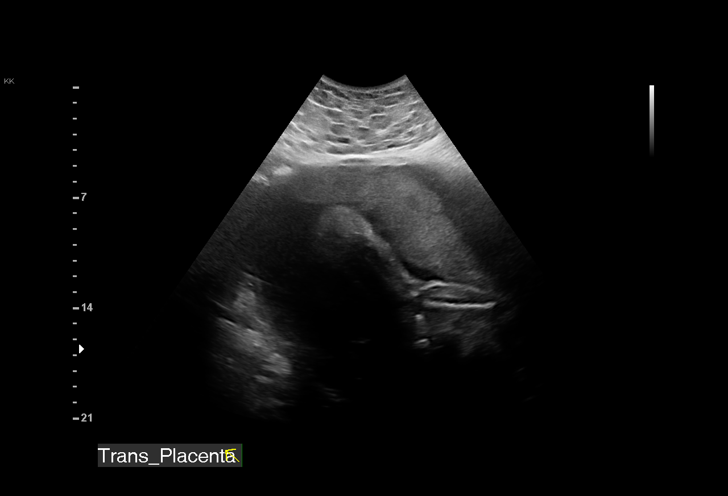
[im 24/38]
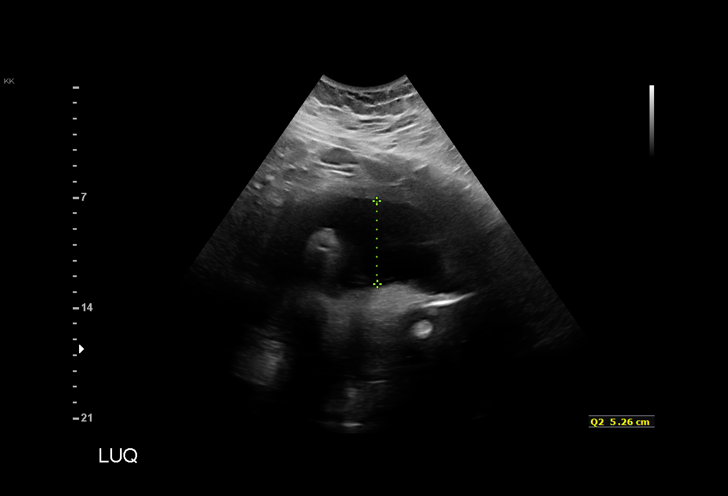
[im 27/38]
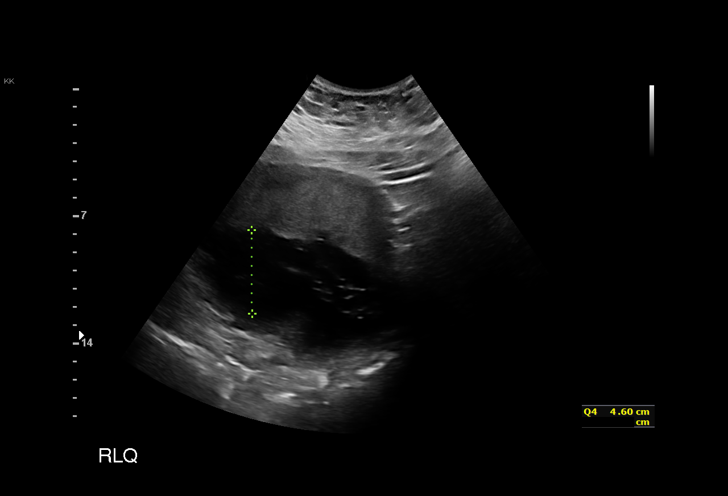
[im 29/38]
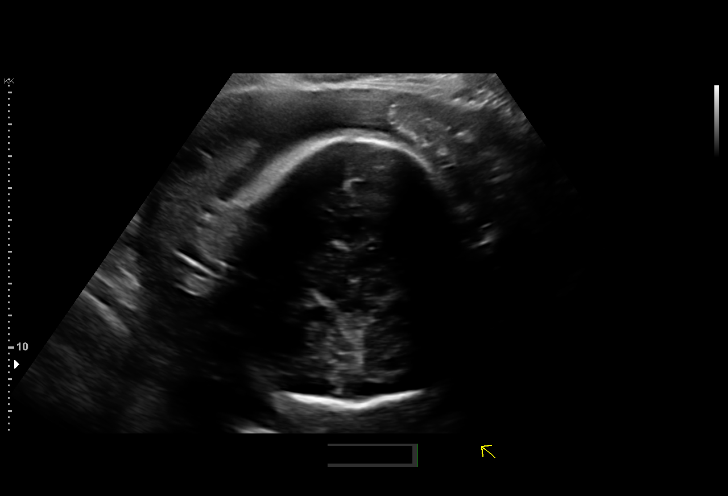
[im 32/38]
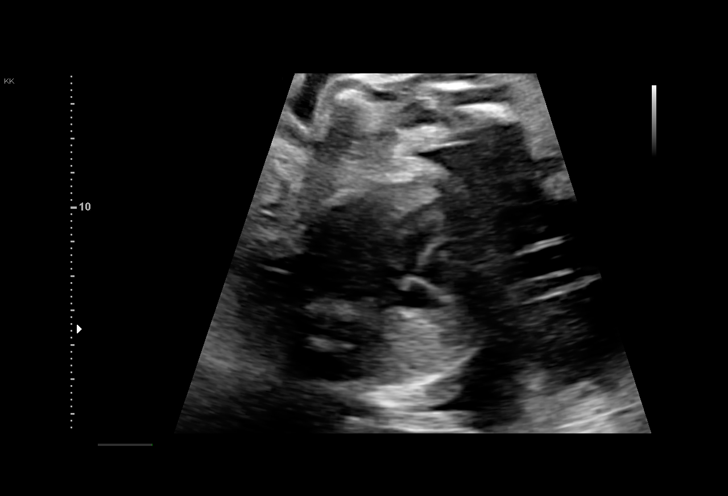
[im 35/38]
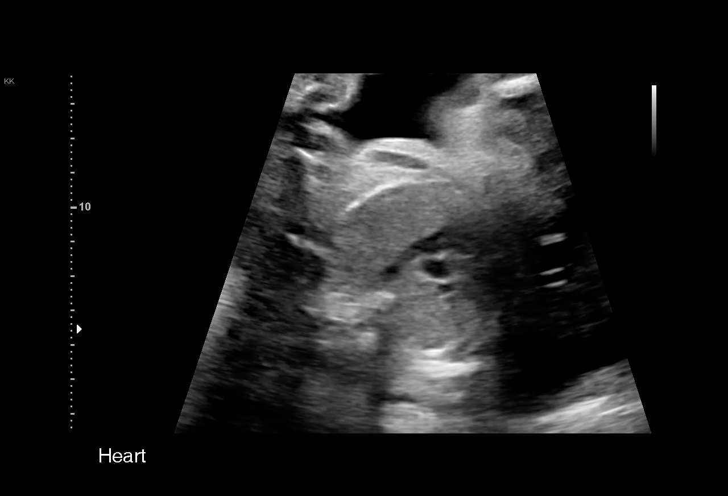
[im 38/38]
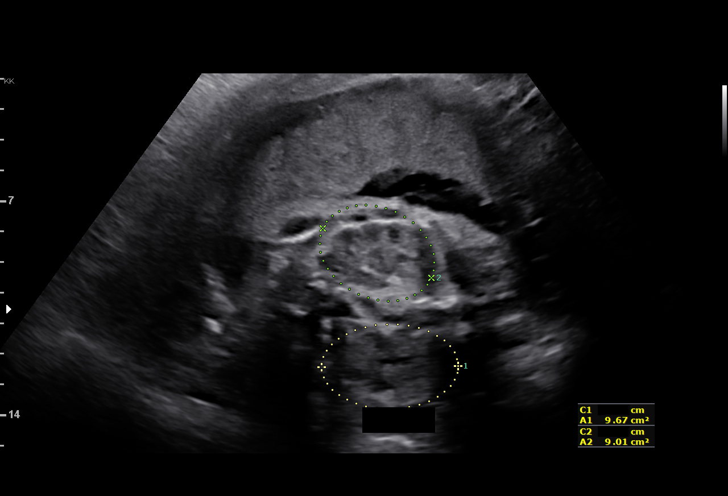

[14 of 28 positions shown; findings below may reference images not displayed]

BRITTANIE NP

 ----------------------------------------------------------------------

 ----------------------------------------------------------------------
Indications

  29 weeks gestation of pregnancy
  Hypertension - Chronic/Pre-existing            [8W]
  Gestational diabetes in pregnancy,             [8W]
  unspecified control
  Obesity complicating pregnancy, second         [8W]
  trimester
  History of cesarean delivery, currently        [8W]
  pregnant
 ----------------------------------------------------------------------
Vital Signs

 BMI:
Fetal Evaluation

 Num Of Fetuses:         1
 Fetal Heart Rate(bpm):  143
 Cardiac Activity:       Observed
 Presentation:           Cephalic
 Placenta:               Anterior
 P. Cord Insertion:      Previously Visualized

 Amniotic Fluid
 AFI FV:      Within normal limits

 AFI Sum(cm)     %Tile       Largest Pocket(cm)
 23.36           95

 RUQ(cm)       RLQ(cm)       LUQ(cm)        LLQ(cm)

Biometry

 BPD:      69.1  mm     G. Age:  27w 5d          5  %    CI:        70.35   %    70 - 86
                                                         FL/HC:      21.6   %    19.6 -
 HC:      262.7  mm     G. Age:  28w 4d          7  %    HC/AC:      1.02        0.99 -
 AC:      256.6  mm     G. Age:  29w 6d         60  %    FL/BPD:     82.2   %    71 - 87
 FL:       56.8  mm     G. Age:  29w 6d         51  %    FL/AC:      22.1   %    20 - 24

 Est. FW:    [8W]  gm      3 lb 2 oz     56  %
OB History

 Gravidity:    2         Term:   1        Prem:   0        SAB:   0
 TOP:          0       Ectopic:  0        Living: 1
Gestational Age

 LMP:           24w 3d        Date:  [DATE]                 EDD:   [DATE]
 U/S Today:     29w 0d                                        EDD:   [DATE]
 Best:          29w 2d     Det. By:  Early Ultrasound         EDD:   [DATE]
                                     ([DATE])
Anatomy

 Cranium:               Appears normal         Aortic Arch:            Not well visualized
 Cavum:                 Previously seen        Ductal Arch:            Not well visualized
 Ventricles:            Previously seen        Diaphragm:              Appears normal
 Choroid Plexus:        Previously seen        Stomach:                Appears normal, left
                                                                       sided
 Cerebellum:            Previously seen        Abdomen:                Appears normal
 Posterior Fossa:       Previously seen        Abdominal Wall:         Previously seen
 Nuchal Fold:           Previously seen        Cord Vessels:           Previously seen
 Face:                  Orbits previously      Kidneys:                Appear normal
                        seen, Profile wnl
 Lips:                  Appears normal         Bladder:                Appears normal
 Thoracic:              Appears normal         Spine:                  Not well visualized
 Heart:                 Appears normal         Upper Extremities:      Previously seen
                        (4CH, axis, and situs
 RVOT:                  Previously seen        Lower Extremities:      Previously seen
 LVOT:                  Previously seen

 Other:  Male gender. Technically difficult due to maternal habitus and fetal
         position.
Cervix Uterus Adnexa

 Cervix
 Not visualized (advanced GA >[8W])
Impression

 Normal interval growth.
Recommendations

 Follow up growth and BPP was scheduled in 4 weeks.

## 2018-10-28 MED ORDER — GLUCOSE BLOOD VI STRP: ORAL_STRIP | 12 refills | Status: DC

## 2018-10-28 MED ORDER — ACCU-CHEK FASTCLIX LANCETS MISC: 1.0000 | Freq: Four times a day (QID) | 12 refills | Status: DC

## 2018-10-28 NOTE — Progress Notes (Signed)
   PRENATAL VISIT NOTE  Subjective:  Alexandra Gray is a 27 y.o. G2P1001 at 7867w2d being seen today for ongoing prenatal care.  She is currently monitored for the following issues for this high-risk pregnancy and has MDD (major depressive disorder), recurrent severe, without psychosis (HCC); Suicidal ideation; Supervision of high risk pregnancy, antepartum; History of cesarean delivery affecting pregnancy; History of pregnancy induced hypertension; Morbid obesity (HCC); Obesity in pregnancy; Elevated fasting blood sugar; Chronic hypertension during pregnancy, antepartum; Gestational diabetes; Cluster B personality disorder (HCC); PTSD (post-traumatic stress disorder); Sleep apnea; and Severe major depression without psychotic features (HCC) on their problem list.  Patient reports no complaints.   .  .  Movement: Present. Denies leaking of fluid.   The following portions of the patient's history were reviewed and updated as appropriate: allergies, current medications, past family history, past medical history, past social history, past surgical history and problem list. Problem list updated.  Objective:   Vitals:   10/28/18 1654  Weight: 291 lb 3.2 oz (132.1 kg)    Fetal Status:     Movement: Present     General:  Alert, oriented and cooperative. Patient is in no acute distress.  Skin: Skin is warm and dry. No rash noted.   Cardiovascular: Normal heart rate noted  Respiratory: Normal respiratory effort, no problems with respiration noted  Abdomen: Soft, gravid, appropriate for gestational age.  Pain/Pressure: Present     Pelvic: Cervical exam deferred        Extremities: Normal range of motion.  Edema: Trace  Mental Status: Normal mood and affect. Normal behavior. Normal judgment and thought content.   Assessment and Plan:  Pregnancy: G2P1001 at 5367w2d GDM, diet controlled- she forgot her sugars today but promises that she will bring them next week MFM U/s today (report not available  at present) She did not keep appt for fetal echo.  There are no diagnoses linked to this encounter. Preterm labor symptoms and general obstetric precautions including but not limited to vaginal bleeding, contractions, leaking of fluid and fetal movement were reviewed in detail with the patient. Please refer to After Visit Summary for other counseling recommendations.  No follow-ups on file.  Future Appointments  Date Time Provider Department Center  10/28/2018  5:00 PM Allie Bossierove, Bode Pieper C, MD [facility]  11/25/2018  2:00 PM [facility] US 3 [facility]    Allie BossierMyra C Teylor Wolven, MD

## 2018-10-29 ENCOUNTER — Other Ambulatory Visit (HOSPITAL_COMMUNITY): Payer: Self-pay | Admitting: *Deleted

## 2018-10-29 ENCOUNTER — Encounter: Payer: Self-pay | Admitting: *Deleted

## 2018-10-29 DIAGNOSIS — O10919 Unspecified pre-existing hypertension complicating pregnancy, unspecified trimester: Secondary | ICD-10-CM

## 2018-10-29 LAB — CBC
HEMATOCRIT: 36.7 % (ref 34.0–46.6)
Hemoglobin: 12.3 g/dL (ref 11.1–15.9)
MCH: 26.3 pg — ABNORMAL LOW (ref 26.6–33.0)
MCHC: 33.5 g/dL (ref 31.5–35.7)
MCV: 78 fL — AB (ref 79–97)
Platelets: 242 10*3/uL (ref 150–450)
RBC: 4.68 x10E6/uL (ref 3.77–5.28)
RDW: 14.5 % (ref 12.3–15.4)
WBC: 8.8 10*3/uL (ref 3.4–10.8)

## 2018-10-29 LAB — HEMOGLOBIN A1C
Est. average glucose Bld gHb Est-mCnc: 105 mg/dL
Hgb A1c MFr Bld: 5.3 % (ref 4.8–5.6)

## 2018-10-29 LAB — RPR: RPR Ser Ql: NONREACTIVE

## 2018-10-29 LAB — HIV ANTIBODY (ROUTINE TESTING W REFLEX): HIV Screen 4th Generation wRfx: NONREACTIVE

## 2018-11-04 ENCOUNTER — Ambulatory Visit (INDEPENDENT_AMBULATORY_CARE_PROVIDER_SITE_OTHER): Payer: Medicaid Other | Admitting: Obstetrics & Gynecology

## 2018-11-04 VITALS — BP 121/79 | HR 99 | Wt 288.7 lb

## 2018-11-04 DIAGNOSIS — O24410 Gestational diabetes mellitus in pregnancy, diet controlled: Secondary | ICD-10-CM

## 2018-11-04 DIAGNOSIS — O10919 Unspecified pre-existing hypertension complicating pregnancy, unspecified trimester: Secondary | ICD-10-CM

## 2018-11-04 DIAGNOSIS — O0993 Supervision of high risk pregnancy, unspecified, third trimester: Secondary | ICD-10-CM

## 2018-11-04 DIAGNOSIS — O99210 Obesity complicating pregnancy, unspecified trimester: Secondary | ICD-10-CM

## 2018-11-04 DIAGNOSIS — O34219 Maternal care for unspecified type scar from previous cesarean delivery: Secondary | ICD-10-CM

## 2018-11-04 DIAGNOSIS — O99213 Obesity complicating pregnancy, third trimester: Secondary | ICD-10-CM

## 2018-11-04 DIAGNOSIS — O10913 Unspecified pre-existing hypertension complicating pregnancy, third trimester: Secondary | ICD-10-CM

## 2018-11-04 DIAGNOSIS — O0990 Supervision of high risk pregnancy, unspecified, unspecified trimester: Secondary | ICD-10-CM

## 2018-11-04 NOTE — Progress Notes (Signed)
   PRENATAL VISIT NOTE  Subjective:  [name] is a 27 y.o. G2P1001 at 9421w2d being seen today for ongoing prenatal care.  She is currently monitored for the following issues for this high-risk pregnancy and has MDD (major depressive disorder), recurrent severe, without psychosis (HCC); Suicidal ideation; Supervision of high risk pregnancy, antepartum; History of cesarean delivery affecting pregnancy; History of pregnancy induced hypertension; Morbid obesity (HCC); Obesity in pregnancy; Elevated fasting blood sugar; Chronic hypertension during pregnancy, antepartum; Gestational diabetes; Cluster B personality disorder (HCC); PTSD (post-traumatic stress disorder); Sleep apnea; and Severe major depression without psychotic features (HCC) on their problem list.  Patient reports feeling lightheaded for a couple of days. Good FM. Marland Kitchen.  Contractions: Irritability. Vag. Bleeding: None.  Movement: Present. Denies leaking of fluid.   The following portions of the patient's history were reviewed and updated as appropriate: allergies, current medications, past family history, past medical history, past social history, past surgical history and problem list. Problem list updated.  Objective:   Vitals:   11/04/18 0918  BP: 121/79  Pulse: 99  Weight: 288 lb 11.2 oz (131 kg)    Fetal Status: Fetal Heart Rate (bpm): 143   Movement: Present     General:  Alert, oriented and cooperative. Patient is in no acute distress.  Skin: Skin is warm and dry. No rash noted.   Cardiovascular: Normal heart rate noted  Respiratory: Normal respiratory effort, no problems with respiration noted  Abdomen: Soft, gravid, appropriate for gestational age.  Pain/Pressure: Present     Pelvic: Cervical exam deferred        Extremities: Normal range of motion.  Edema: Trace  Mental Status: Normal mood and affect. Normal behavior. Normal judgment and thought content.   Assessment and Plan:  Pregnancy: G2P1001 at 8521w2d  1.  Supervision of high risk pregnancy, antepartum   2. Obesity in pregnancy - excellent weight gain  3. History of cesarean delivery affecting pregnancy -desires RLTCS, declines BTL  4. Diet controlled gestational diabetes mellitus (GDM) in third trimester - fasting sugars are elevated, will start metformin 500 mg qhs - reschedule fetal echo  5. Chronic hypertension during pregnancy, antepartum - BP good today, on baby asa - start BPP at 32 weeks 6. Lightheadedness- hbg 12.3 last week, I encouraged increased water intake   Preterm labor symptoms and general obstetric precautions including but not limited to vaginal bleeding, contractions, leaking of fluid and fetal movement were reviewed in detail with the patient. Please refer to After Visit Summary for other counseling recommendations.  No follow-ups on file.  Future Appointments  Date Time Provider Department Center  11/25/2018  2:00 PM [facility] US 3 [facility]    Allie BossierMyra C Nijae Doyel, MD

## 2018-11-04 NOTE — Progress Notes (Signed)
Pt states for the last couple of days has been lightheaded.

## 2018-11-06 ENCOUNTER — Other Ambulatory Visit: Payer: Self-pay

## 2018-11-06 MED ORDER — METFORMIN HCL 500 MG PO TABS: 500.0000 mg | ORAL_TABLET | Freq: Every day | ORAL | 3 refills | Status: DC

## 2018-11-06 NOTE — Telephone Encounter (Signed)
Pt called the front desk and informed me that she was suppose to be prescribed a medication that starts with "M" for her diabetes.  Per chart review, Dr. Marice Potterove wanted to start pt on Metformin 500 mg q hs.  Medication e-prescribed.

## 2018-11-12 ENCOUNTER — Ambulatory Visit (INDEPENDENT_AMBULATORY_CARE_PROVIDER_SITE_OTHER): Payer: Medicaid Other | Admitting: Advanced Practice Midwife

## 2018-11-12 ENCOUNTER — Encounter (HOSPITAL_COMMUNITY): Payer: Self-pay

## 2018-11-12 VITALS — BP 116/46 | HR 97 | Wt 288.9 lb

## 2018-11-12 DIAGNOSIS — O99210 Obesity complicating pregnancy, unspecified trimester: Secondary | ICD-10-CM

## 2018-11-12 DIAGNOSIS — O24415 Gestational diabetes mellitus in pregnancy, controlled by oral hypoglycemic drugs: Secondary | ICD-10-CM

## 2018-11-12 DIAGNOSIS — O10919 Unspecified pre-existing hypertension complicating pregnancy, unspecified trimester: Secondary | ICD-10-CM

## 2018-11-12 DIAGNOSIS — O26813 Pregnancy related exhaustion and fatigue, third trimester: Secondary | ICD-10-CM

## 2018-11-12 DIAGNOSIS — O34219 Maternal care for unspecified type scar from previous cesarean delivery: Secondary | ICD-10-CM

## 2018-11-12 DIAGNOSIS — O10913 Unspecified pre-existing hypertension complicating pregnancy, third trimester: Secondary | ICD-10-CM

## 2018-11-12 DIAGNOSIS — O99213 Obesity complicating pregnancy, third trimester: Secondary | ICD-10-CM

## 2018-11-12 DIAGNOSIS — O0993 Supervision of high risk pregnancy, unspecified, third trimester: Secondary | ICD-10-CM

## 2018-11-12 DIAGNOSIS — O0990 Supervision of high risk pregnancy, unspecified, unspecified trimester: Secondary | ICD-10-CM

## 2018-11-12 DIAGNOSIS — O368130 Decreased fetal movements, third trimester, not applicable or unspecified: Secondary | ICD-10-CM

## 2018-11-12 DIAGNOSIS — R0602 Shortness of breath: Secondary | ICD-10-CM

## 2018-11-12 MED ORDER — GLYBURIDE 2.5 MG PO TABS: 2.5000 mg | ORAL_TABLET | Freq: Every day | ORAL | 0 refills | Status: DC

## 2018-11-12 NOTE — Progress Notes (Signed)
Pt states is having side effects from the Metformin such as dizziness & light headedness.Stopped taking Metformin 2 days ago.

## 2018-11-12 NOTE — Progress Notes (Signed)
PRENATAL VISIT NOTE  Subjective:  Alexandra Gray is a 27 y.o. G2P1001 at 71w3dbeing seen today for ongoing prenatal care.  She is currently monitored for the following issues for this high-risk pregnancy and has MDD (major depressive disorder), recurrent severe, without psychosis ([place]; Suicidal ideation; Supervision of high risk pregnancy, antepartum; History of cesarean delivery affecting pregnancy; History of pregnancy induced hypertension; Morbid obesity ([place]; Obesity in pregnancy; Elevated fasting blood sugar; Chronic hypertension during pregnancy, antepartum; Gestational diabetes; Cluster B personality disorder ([place]; PTSD (post-traumatic stress disorder); Sleep apnea; and Severe major depression without psychotic features ([place] on their problem list.  Patient reports fatigue and worsening, sometimes severe SOB w/ exertion that last for 30+ minutes. SOB walking across a room. Worse than previous pregnancy. Sx worsened since starting Metformin so she thought it might be a side effect and stopped Metformin 2 days ago, but is still feeling the same. No Hx cardia or pulmonary problems. No chest pain, cough, fever, chills, calf pain.  Contractions: Irritability. Vag. Bleeding: None.  Movement: (!) Decreased. Denies leaking of fluid.   The following portions of the patient's history were reviewed and updated as appropriate: allergies, current medications, past family history, past medical history, past social history, past surgical history and problem list. Problem list updated.  Objective:   Vitals:   11/12/18 0908  BP: (!) 116/46  Pulse: 97  Weight: 288 lb 14.4 oz (131 kg)    Fetal Status: Fetal Heart Rate (bpm): 152   Movement: (!) Decreased     General:  Alert, oriented and cooperative. Patient is in no acute distress.  Skin: Skin is warm and dry. No rash noted.   Cardiovascular: Normal heart rate noted  Respiratory: Normal respiratory effort, no problems with respiration noted    Abdomen: Soft, gravid, appropriate for gestational age.  Pain/Pressure: Absent     Pelvic: Cervical exam deferred        Extremities: Normal range of motion.  Edema: Trace  Mental Status: Normal mood and affect. Normal behavior. Normal judgment and thought content.   > 50% CBGs in range on Metformin. All elevated since stopping Metformin 2 days ago.   I reviewed the NST. EFM: Baseline: 140 bpm, Variability: Good {> 6 bpm), Accelerations: 10x10's and Decelerations: Absent Toco: none  Assessment and Plan:  Pregnancy: G2P1001 at 389w3d1. Chronic hypertension during pregnancy, antepartum - Stable off meds  2. Gestational diabetes mellitus (GDM) in third trimester controlled on oral hypoglycemic drug - No Hypoglycemia on Metformin. CBGs all elevated off Metformin. Do not believe complaints today are 2/2 Metformin, but will try switching to Glyburide 2.5 QHS. Instructed pt to check CBG any time she is feelign bad.  - glyBURIDE (DIABETA) 2.5 MG tablet; Take 1 tablet (2.5 mg total) by mouth at bedtime.  Dispense: 30 tablet; Refill: 0 - Start antenatel testing at 32 weeks. Pt will be out of town so testing was scheduled around travel as well as possible.   3. Obesity in pregnancy   4. Supervision of high risk pregnancy, antepartum   5. History of cesarean delivery affecting pregnancy - Plans repeat. Sent request to scheduler.  6. SOB (shortness of breath) on exertion--gradual onset. Sx not currently significant. Will work-up outpatient.  - Go to MAU or ED for emergent Sx.  - CBC - TSH - Comp Met (CMET) - Referral to Cardiology  7. Fatigue during pregnancy in third trimester - OP Cardiology - CBC - TSH - Comp Met (CMET)  Preterm labor symptoms and general obstetric precautions including but not limited to vaginal bleeding, contractions, leaking of fluid and fetal movement were reviewed in detail with the patient. Please refer to After Visit Summary for other counseling  recommendations.  Return in about 6 days (around 11/18/2018) for NST/BPP only; Manya Silvas, CNM

## 2018-11-13 ENCOUNTER — Other Ambulatory Visit: Payer: Self-pay

## 2018-11-13 DIAGNOSIS — R0689 Other abnormalities of breathing: Secondary | ICD-10-CM

## 2018-11-13 LAB — COMPREHENSIVE METABOLIC PANEL
A/G RATIO: 1.2 (ref 1.2–2.2)
ALT: 10 IU/L (ref 0–32)
AST: 9 IU/L (ref 0–40)
Albumin: 3.6 g/dL (ref 3.5–5.5)
Alkaline Phosphatase: 117 IU/L (ref 39–117)
BUN/Creatinine Ratio: 8 — ABNORMAL LOW (ref 9–23)
BUN: 4 mg/dL — ABNORMAL LOW (ref 6–20)
Bilirubin Total: 0.2 mg/dL (ref 0.0–1.2)
CHLORIDE: 101 mmol/L (ref 96–106)
CO2: 20 mmol/L (ref 20–29)
Calcium: 9.7 mg/dL (ref 8.7–10.2)
Creatinine, Ser: 0.49 mg/dL — ABNORMAL LOW (ref 0.57–1.00)
GFR calc Af Amer: 154 mL/min/{1.73_m2} (ref >59)
GFR calc non Af Amer: 134 mL/min/{1.73_m2} (ref >59)
GLOBULIN, TOTAL: 2.9 g/dL (ref 1.5–4.5)
Glucose: 73 mg/dL (ref 65–99)
POTASSIUM: 4 mmol/L (ref 3.5–5.2)
Sodium: 138 mmol/L (ref 134–144)
Total Protein: 6.5 g/dL (ref 6.0–8.5)

## 2018-11-13 LAB — CBC
HEMATOCRIT: 37.4 % (ref 34.0–46.6)
Hemoglobin: 12.8 g/dL (ref 11.1–15.9)
MCH: 26.6 pg (ref 26.6–33.0)
MCHC: 34.2 g/dL (ref 31.5–35.7)
MCV: 78 fL — ABNORMAL LOW (ref 79–97)
Platelets: 232 10*3/uL (ref 150–450)
RBC: 4.81 x10E6/uL (ref 3.77–5.28)
RDW: 14.5 % (ref 12.3–15.4)
WBC: 10.1 10*3/uL (ref 3.4–10.8)

## 2018-11-13 LAB — TSH: TSH: 2.22 u[IU]/mL (ref 0.450–4.500)

## 2018-11-13 NOTE — Progress Notes (Signed)
Called pt & advised of Cardiology appt on 11/19/18 [facility].Pt verbalized understanding.

## 2018-11-19 ENCOUNTER — Ambulatory Visit: Payer: Self-pay | Admitting: Cardiology

## 2018-11-21 ENCOUNTER — Ambulatory Visit: Payer: Self-pay

## 2018-11-21 ENCOUNTER — Ambulatory Visit (INDEPENDENT_AMBULATORY_CARE_PROVIDER_SITE_OTHER): Payer: Medicaid Other | Admitting: *Deleted

## 2018-11-21 VITALS — BP 127/81 | HR 97 | Wt 290.6 lb

## 2018-11-21 DIAGNOSIS — O24415 Gestational diabetes mellitus in pregnancy, controlled by oral hypoglycemic drugs: Secondary | ICD-10-CM

## 2018-11-21 DIAGNOSIS — O10919 Unspecified pre-existing hypertension complicating pregnancy, unspecified trimester: Secondary | ICD-10-CM

## 2018-11-21 IMAGING — US US FETAL BPP W/ NON-STRESS
1 series · 13 of 13 positions shown · non-contrast
Comparison: none

[Series 1: us fetal bpp w/nonstress · 13 acquisitions, 13 frames shown]
[im 1/13]
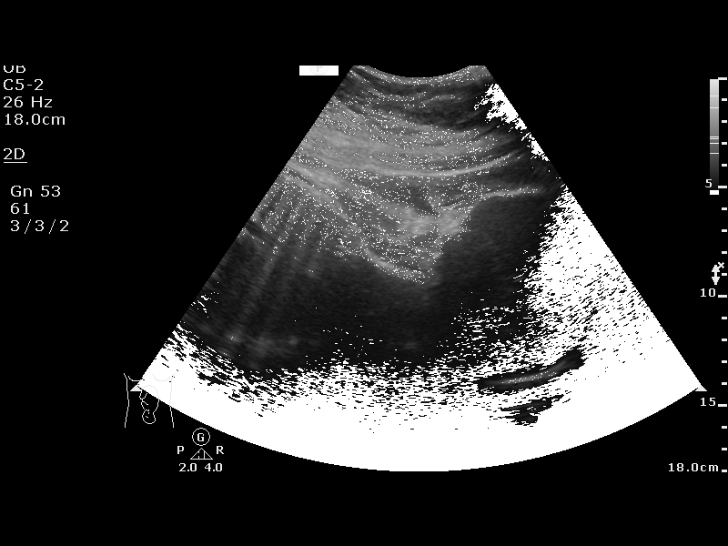
[im 2/13]
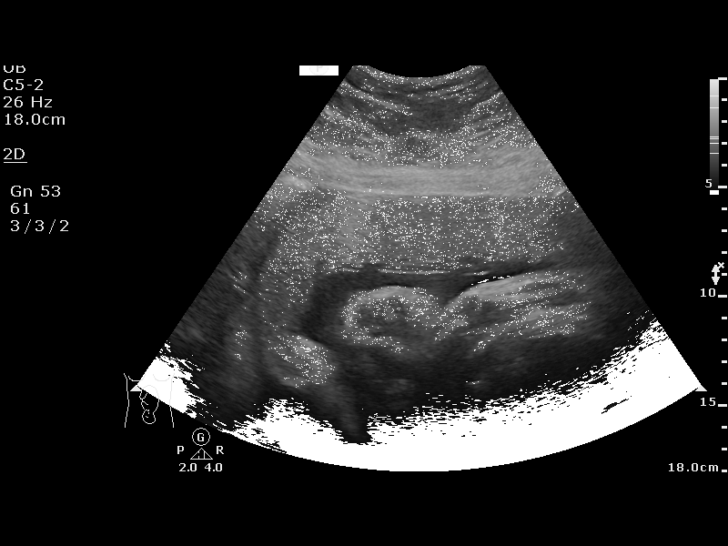
[im 3/13]
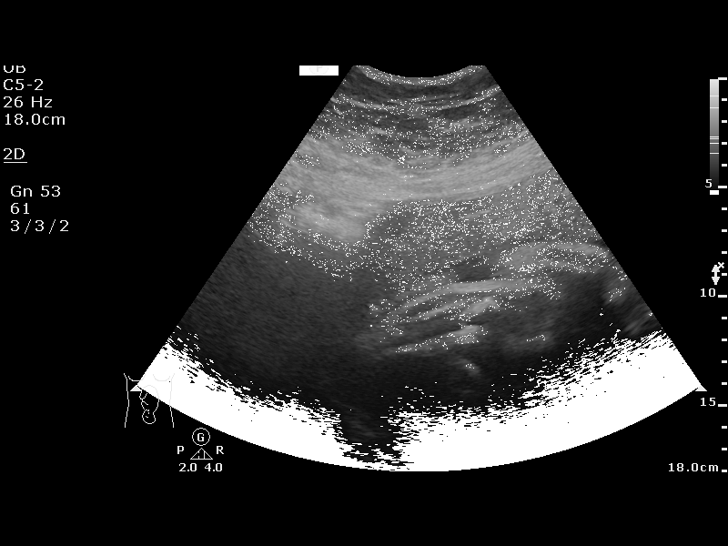
[im 4/13]
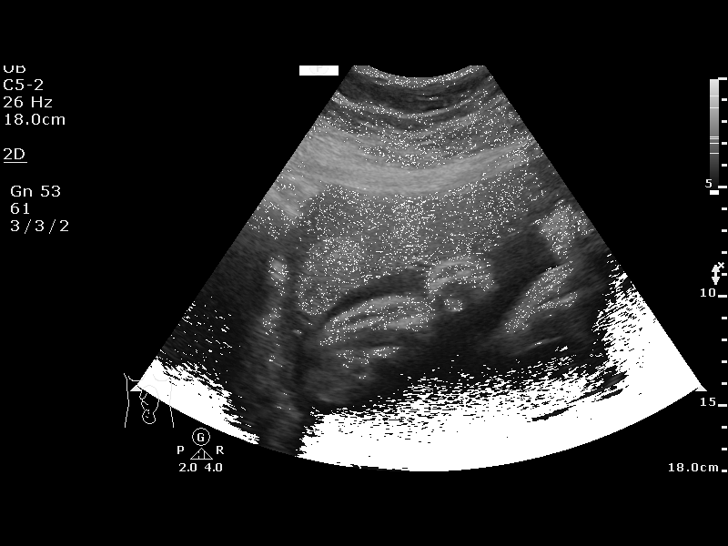
[im 5/13]
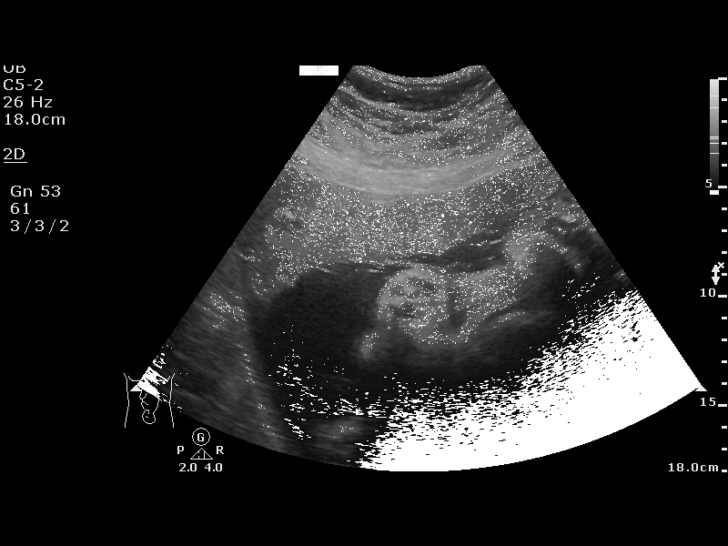
[im 6/13]
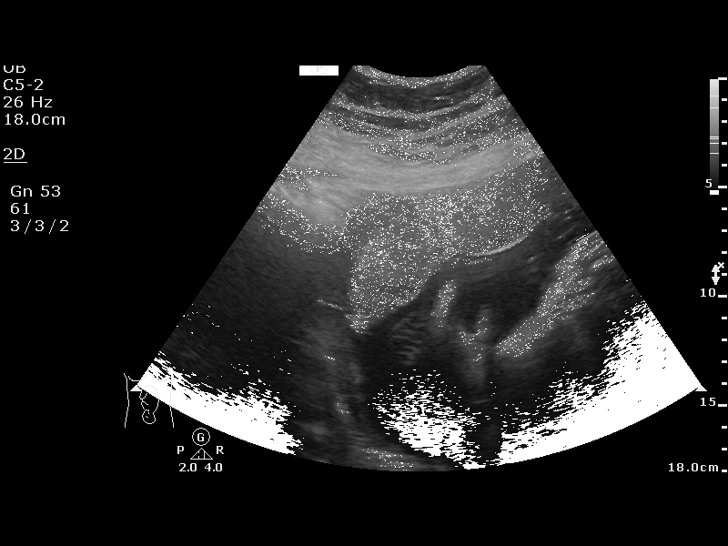
[im 7/13]
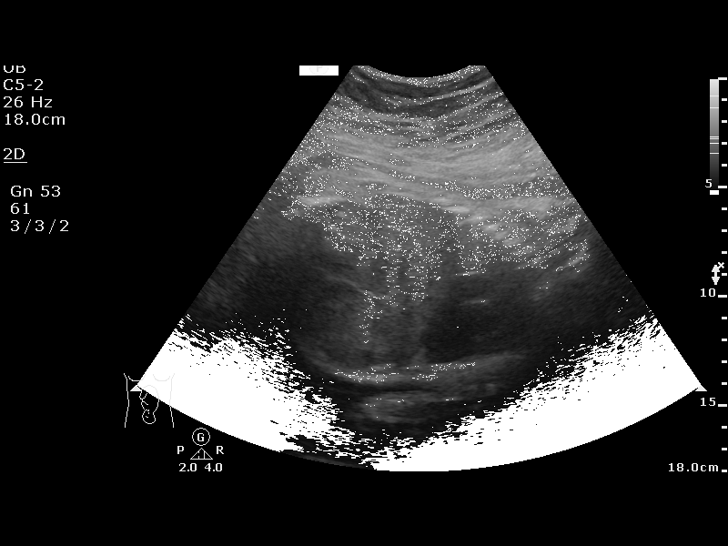
[im 8/13]
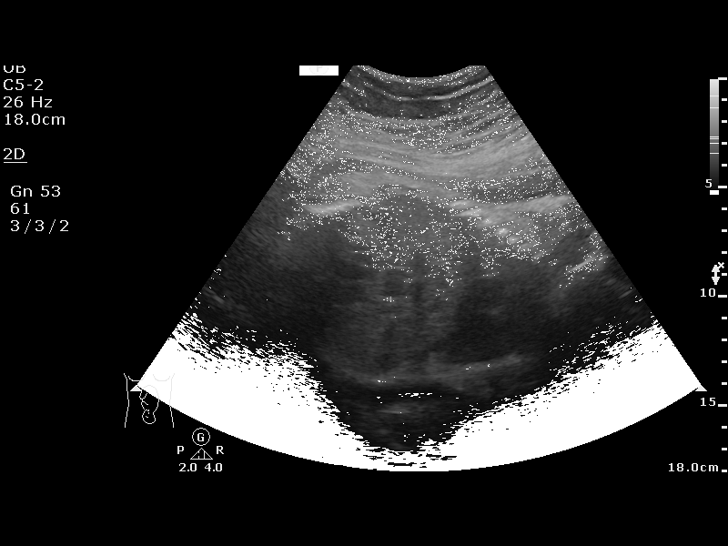
[im 9/13]
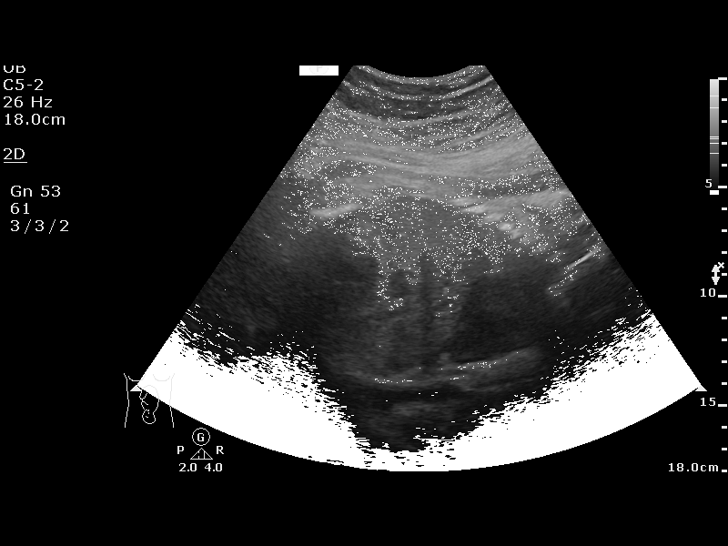
[im 10/13]
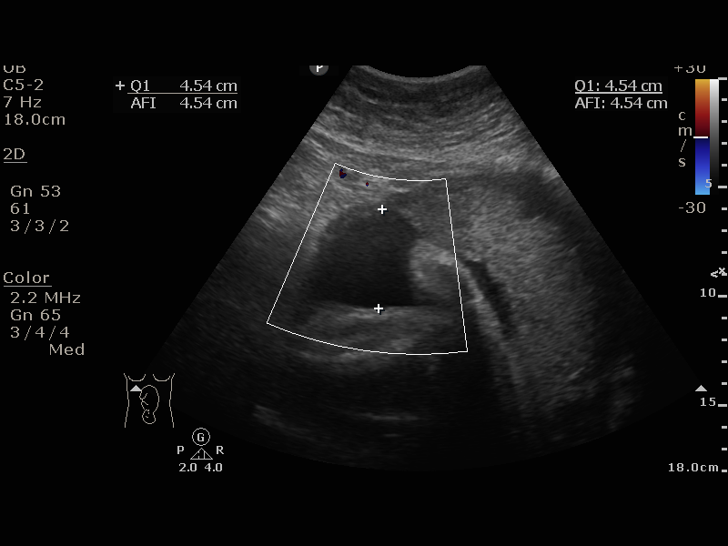
[im 11/13]
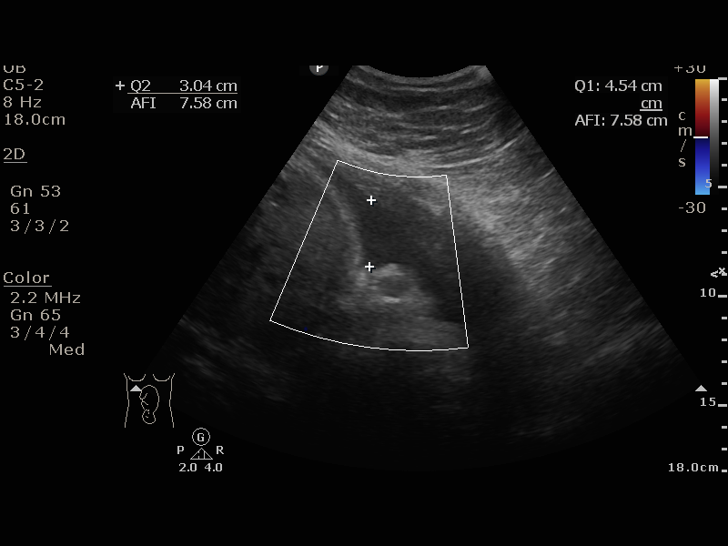
[im 12/13]
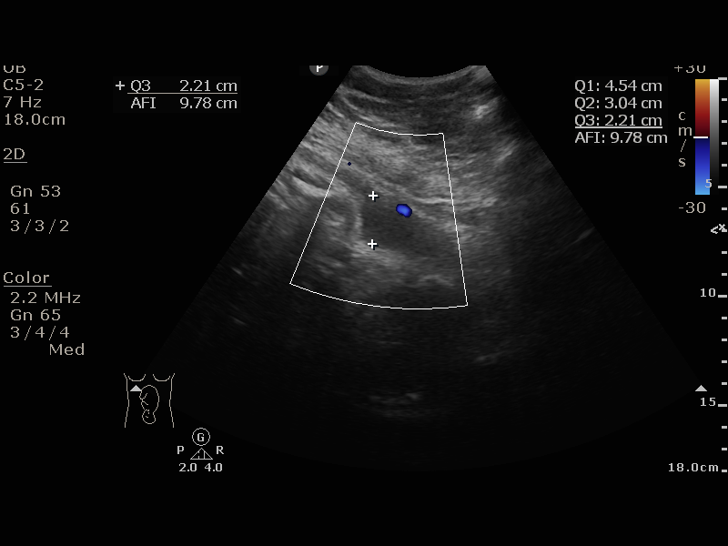
[im 13/13]
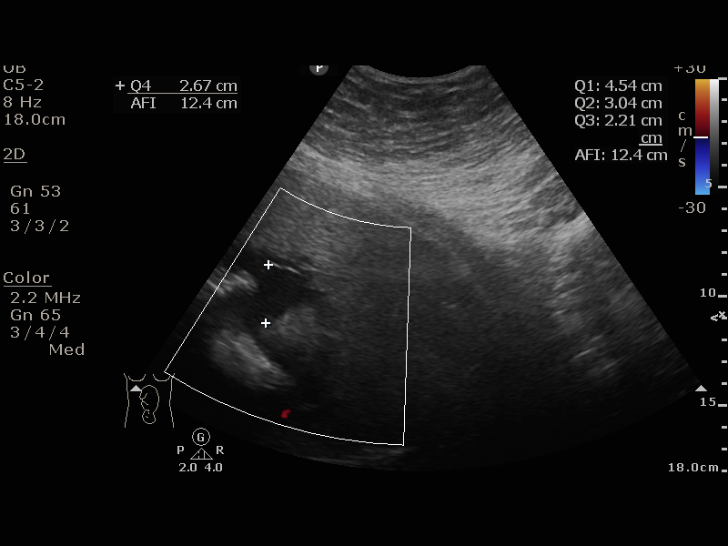

[13 of 13 positions shown; findings below may reference images not displayed]

[facility] Clinic
                                                            Women's
                                                            [REDACTED]

  1  US FETAL BPP W/NONSTRESS             76818.4      BHEBHE
 ----------------------------------------------------------------------

 ----------------------------------------------------------------------
Service(s) Provided

 ----------------------------------------------------------------------
Indications

  32 weeks gestation of pregnancy
  Gestational diabetes in pregnancy,             [9S]
  controlled by oral hypoglycemic drugs
  Unspecified pre-existing hypertension          [9S]
  complicating pregnancy, third trimester
 ----------------------------------------------------------------------
Vital Signs

                                                Height:        5'0"
Fetal Evaluation

 Num Of Fetuses:         1
 Preg. Location:         Intrauterine
 Cardiac Activity:       Observed
 Presentation:           Cephalic

 Amniotic Fluid
 AFI FV:      Within normal limits

 AFI Sum(cm)     %Tile       Largest Pocket(cm)
 12.46           36
 RUQ(cm)       RLQ(cm)       LUQ(cm)        LLQ(cm)

Biophysical Evaluation

 Amniotic F.V:   Pocket => 2 cm two         F. Tone:        Observed
                 planes
 F. Movement:    Observed                   N.S.T:          Reactive
 F. Breathing:   Observed                   Score:          [DATE]
OB History

 Gravidity:    2         Term:   1        Prem:   0        SAB:   0
 TOP:          0       Ectopic:  0        Living: 1
Gestational Age

 LMP:           27w 6d        Date:  [DATE]                 EDD:   [DATE]
 Best:          32w 5d     Det. By:  Early Ultrasound         EDD:   [DATE]
                                     ([DATE])
Comments

 Technically limited exam due to maternal body habitus.
Impression

 Antenatal testing is reassuring. BPP [DATE]. Normal amniotic
 fluid volume.
Recommendations

 Continue weekly antenatal testing till delivery.
              BHEBHE

## 2018-11-21 NOTE — Progress Notes (Signed)
Pt informed that the ultrasound is considered a limited OB ultrasound and is not intended to be a complete ultrasound exam.  Patient also informed that the ultrasound is not being completed with the intent of assessing for fetal or placental anomalies or any pelvic abnormalities.  Explained that the purpose of today's ultrasound is to assess for presentation, BPP and amniotic fluid volume.  Patient acknowledges the purpose of the exam and the limitations of the study.    US for growth and BPP @ [facility] on 11/29/18.

## 2018-11-22 ENCOUNTER — Encounter: Payer: Self-pay | Admitting: Obstetrics and Gynecology

## 2018-11-25 ENCOUNTER — Ambulatory Visit (HOSPITAL_COMMUNITY): Payer: Medicaid Other

## 2018-11-29 ENCOUNTER — Ambulatory Visit (HOSPITAL_COMMUNITY)
Admission: RE | Admit: 2018-11-29 | Discharge: 2018-11-29 | Disposition: A | Discharge status: Home / Self Care | Payer: Medicaid Other | Source: Ambulatory Visit | Attending: Maternal & Fetal Medicine | Admitting: Maternal & Fetal Medicine

## 2018-11-29 ENCOUNTER — Encounter: Payer: Self-pay | Admitting: Obstetrics & Gynecology

## 2018-11-29 ENCOUNTER — Encounter (HOSPITAL_COMMUNITY): Payer: Self-pay

## 2018-11-29 DIAGNOSIS — O10919 Unspecified pre-existing hypertension complicating pregnancy, unspecified trimester: Secondary | ICD-10-CM | POA: Diagnosis present

## 2018-11-29 DIAGNOSIS — O24415 Gestational diabetes mellitus in pregnancy, controlled by oral hypoglycemic drugs: Secondary | ICD-10-CM | POA: Diagnosis present

## 2018-11-29 DIAGNOSIS — O0990 Supervision of high risk pregnancy, unspecified, unspecified trimester: Secondary | ICD-10-CM | POA: Insufficient documentation

## 2018-11-29 DIAGNOSIS — Z362 Encounter for other antenatal screening follow-up: Secondary | ICD-10-CM

## 2018-11-29 DIAGNOSIS — O99210 Obesity complicating pregnancy, unspecified trimester: Secondary | ICD-10-CM | POA: Diagnosis present

## 2018-11-29 DIAGNOSIS — O10013 Pre-existing essential hypertension complicating pregnancy, third trimester: Secondary | ICD-10-CM

## 2018-11-29 DIAGNOSIS — O34219 Maternal care for unspecified type scar from previous cesarean delivery: Secondary | ICD-10-CM | POA: Diagnosis not present

## 2018-11-29 IMAGING — US US MFM OB FOLLOW-UP
1 series · 13 of 25 positions shown · non-contrast
Comparison: none

[Series 1: us mfm ob follow-up · 25 acquisitions, 13 frames shown]
[im 1/25]
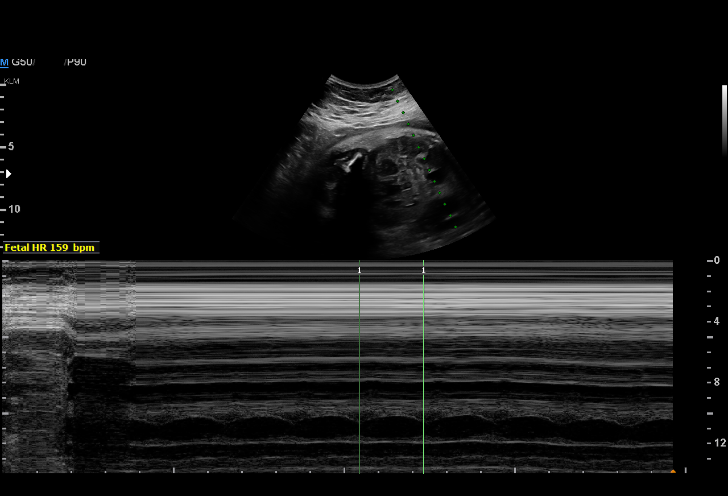
[im 3/25]
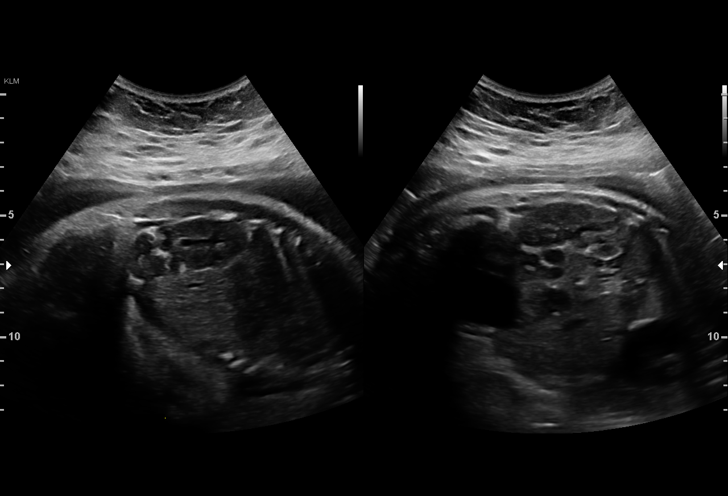
[im 5/25]
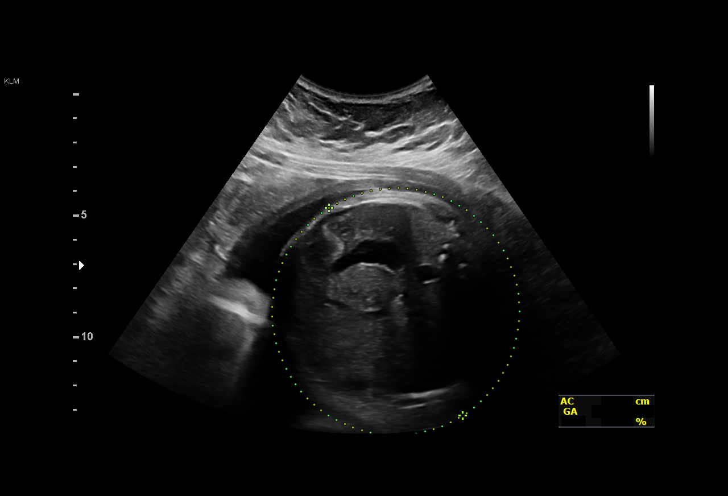
[im 7/25]
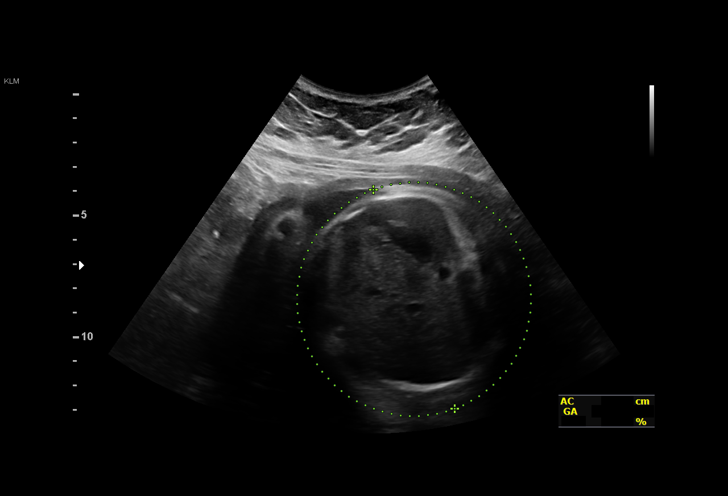
[im 9/25]
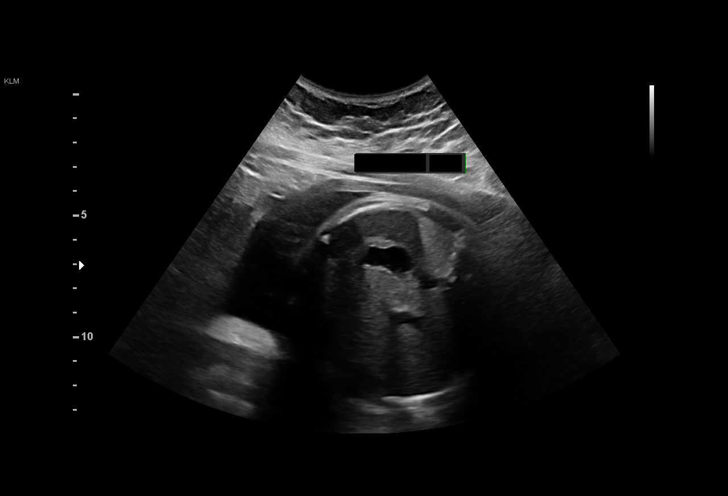
[im 11/25]
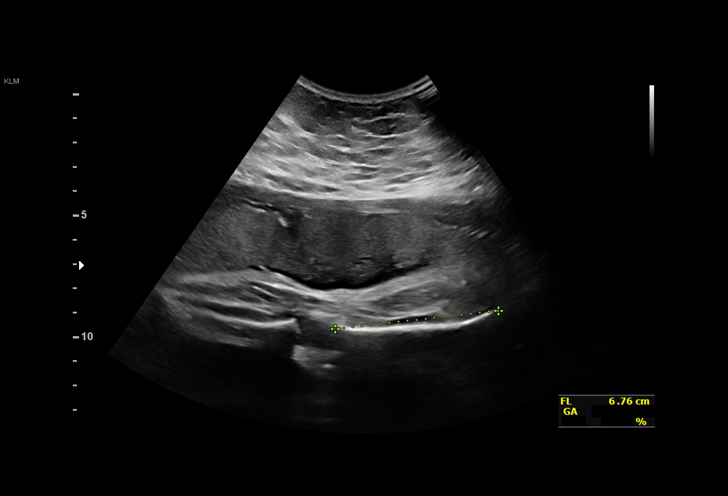
[im 13/25]
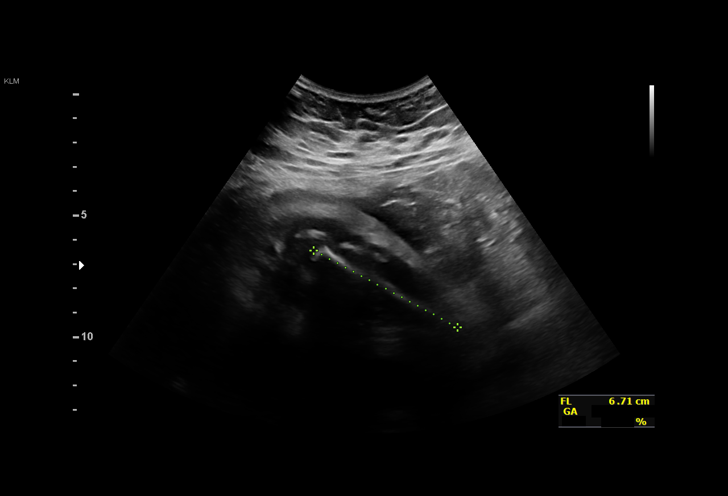
[im 15/25]
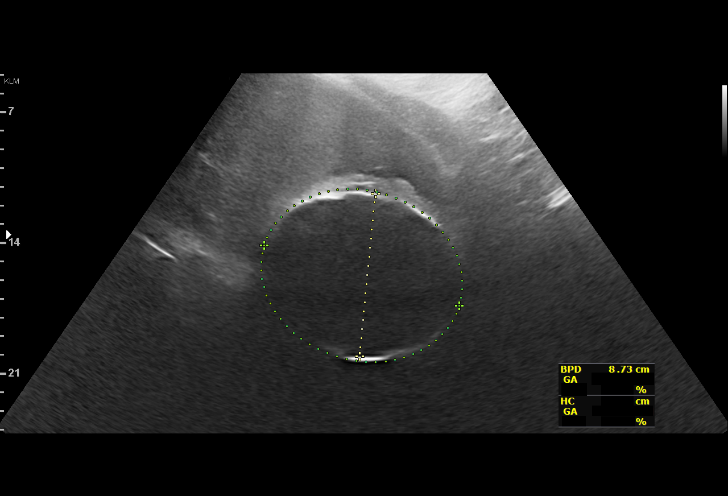
[im 17/25]
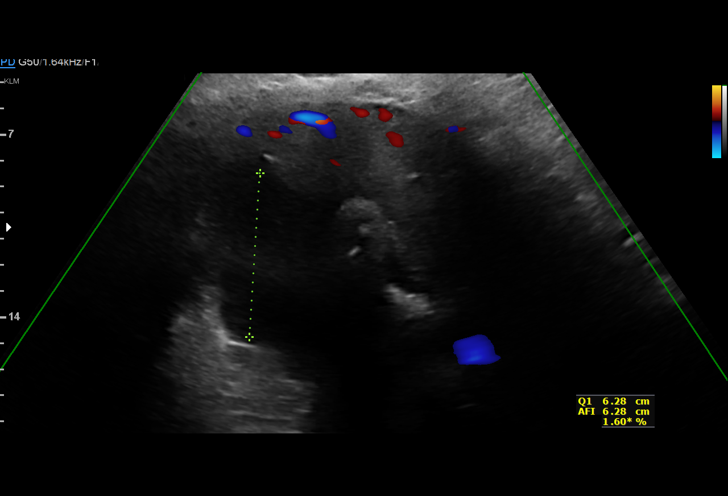
[im 19/25]
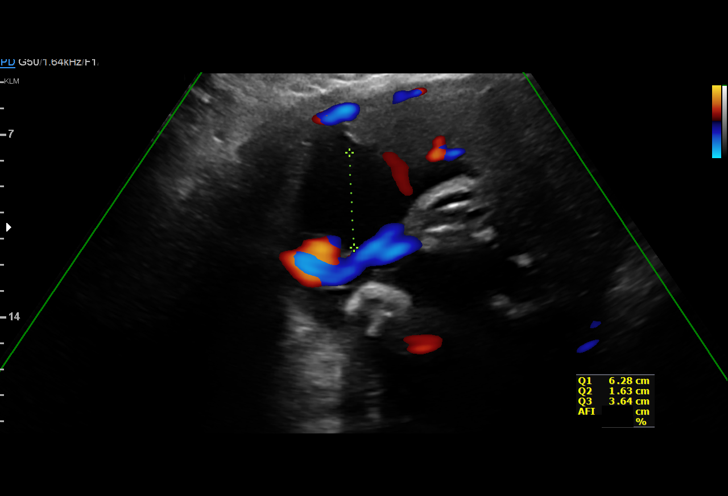
[im 21/25]
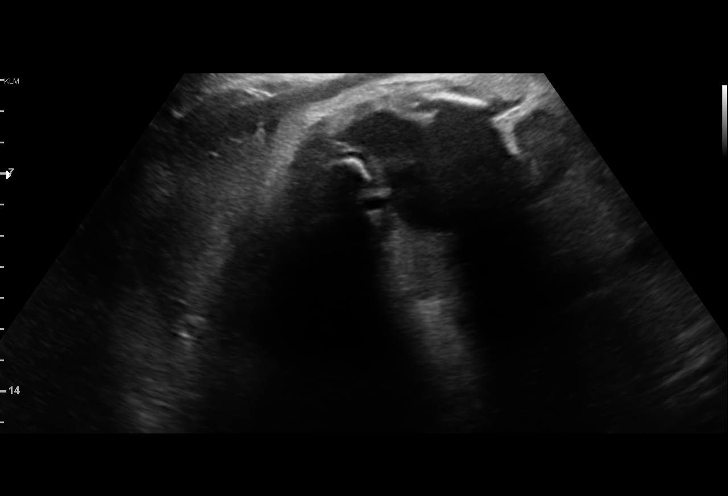
[im 23/25]
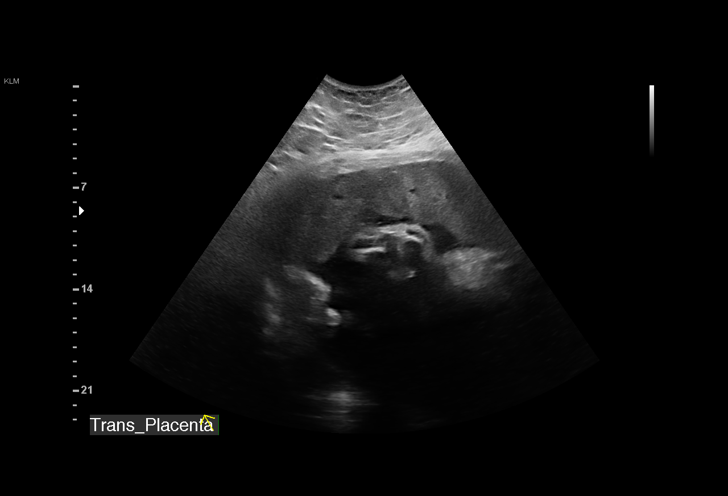
[im 25/25]
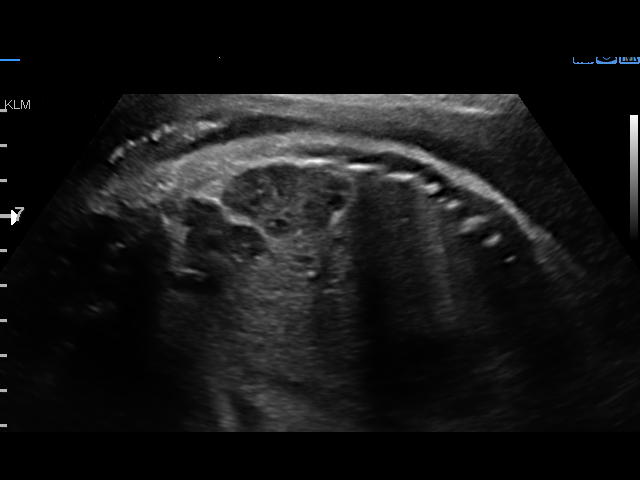

[13 of 25 positions shown; findings below may reference images not displayed]

OB/Gyn Clinic

     STRESS                                            GOSENI
                                                       GOSENI
 ----------------------------------------------------------------------

 ----------------------------------------------------------------------
Indications

  Gestational diabetes in pregnancy,             [YO]
  controlled by oral hypoglycemic drugs
  33 weeks gestation of pregnancy
  Hypertension - Chronic/Pre-existing            [YO]
  History of cesarean delivery, currently        [YO]
  pregnant
  Antenatal follow-up for nonvisualized fetal    [YO]
  anatomy
 ----------------------------------------------------------------------
Vital Signs

                                                Height:        5'0"
Fetal Evaluation

 Num Of Fetuses:         1
 Fetal Heart Rate(bpm):  159
 Cardiac Activity:       Observed
 Presentation:           Cephalic
 Placenta:               Anterior
 P. Cord Insertion:      Previously Visualized
 Amniotic Fluid
 AFI FV:      Within normal limits

 AFI Sum(cm)     %Tile       Largest Pocket(cm)
 14.99           53

 RUQ(cm)       RLQ(cm)       LUQ(cm)        LLQ(cm)

Biophysical Evaluation

 Amniotic F.V:   Within normal limits       F. Tone:        Observed
 F. Movement:    Observed                   Score:          [DATE]
 F. Breathing:   Observed
Biometry

 BPD:      87.6  mm     G. Age:  35w 3d         86  %    CI:        75.79   %    70 - 86
                                                         FL/HC:      21.2   %    19.4 -
 HC:       319   mm     G. Age:  35w 6d         67  %    HC/AC:      1.02        0.96 -
 AC:      312.2  mm     G. Age:  35w 1d         85  %    FL/BPD:     77.1   %    71 - 87
 FL:       67.5  mm     G. Age:  34w 5d         62  %    FL/AC:      21.6   %    20 - 24

 Est. FW:    [YO]  gm    5 lb 12 oz      80  %
OB History

 Gravidity:    2         Term:   1        Prem:   0        SAB:   0
 TOP:          0       Ectopic:  0        Living: 1
Gestational Age

 LMP:           29w 0d        Date:  [DATE]                 EDD:   [DATE]
 U/S Today:     35w 2d                                        EDD:   [DATE]
 Best:          33w 6d     Det. By:  Early Ultrasound         EDD:   [DATE]
                                     ([DATE])
Anatomy

 Cranium:               Appears normal         Aortic Arch:            Not well visualized
 Cavum:                 Previously seen        Ductal Arch:            Not well visualized
 Ventricles:            Previously seen        Diaphragm:              Previously seen
 Choroid Plexus:        Previously seen        Stomach:                Appears normal, left
                                                                       sided
 Cerebellum:            Previously seen        Abdomen:                Appears normal
 Posterior Fossa:       Previously seen        Abdominal Wall:         Previously seen
 Nuchal Fold:           Previously seen        Cord Vessels:           Previously seen
 Face:                  Orbits previously      Kidneys:                Appear normal
                        seen, Profile wnl
 Lips:                  Previously seen        Bladder:                Appears normal
 Thoracic:              Appears normal         Spine:                  Not well visualized
 Heart:                 Previously seen        Upper Extremities:      Previously seen
 RVOT:                  Previously seen        Lower Extremities:      Previously seen
 LVOT:                  Previously seen

 Other:  Male gender. Technically difficult due to maternal habitus and fetal
         position.
Cervix Uterus Adnexa
 Cervix
 Not visualized (advanced GA >[YO])
Impression

 Gestational diabetes. Patient takes metformin for control.

 Chronic Chronic hypertension. Well-controlled without
 antihypertensives.

 Fetal growth is appropriate for gestational age. Amniotic fluid
 is normal and good fetal activity is seen. Antenatal testing is
 reassuring. BPP [DATE].
Recommendations

 -Continue weekly antenatal testing till delivery.
 -Fetal growth assessment in 4 weeks if undelivered.
                 GOSENI

## 2018-12-02 ENCOUNTER — Ambulatory Visit: Payer: Self-pay | Admitting: Cardiology

## 2018-12-05 ENCOUNTER — Encounter: Payer: Self-pay | Admitting: *Deleted

## 2018-12-05 ENCOUNTER — Ambulatory Visit: Payer: Self-pay

## 2018-12-05 ENCOUNTER — Ambulatory Visit (INDEPENDENT_AMBULATORY_CARE_PROVIDER_SITE_OTHER): Payer: Medicaid Other | Admitting: Obstetrics and Gynecology

## 2018-12-05 ENCOUNTER — Encounter: Payer: Self-pay | Admitting: Obstetrics and Gynecology

## 2018-12-05 ENCOUNTER — Ambulatory Visit (INDEPENDENT_AMBULATORY_CARE_PROVIDER_SITE_OTHER): Payer: Medicaid Other | Admitting: *Deleted

## 2018-12-05 VITALS — BP 137/73 | HR 98 | Wt 289.6 lb

## 2018-12-05 DIAGNOSIS — O24415 Gestational diabetes mellitus in pregnancy, controlled by oral hypoglycemic drugs: Secondary | ICD-10-CM

## 2018-12-05 DIAGNOSIS — O0993 Supervision of high risk pregnancy, unspecified, third trimester: Secondary | ICD-10-CM

## 2018-12-05 DIAGNOSIS — O10913 Unspecified pre-existing hypertension complicating pregnancy, third trimester: Secondary | ICD-10-CM

## 2018-12-05 DIAGNOSIS — O0990 Supervision of high risk pregnancy, unspecified, unspecified trimester: Secondary | ICD-10-CM

## 2018-12-05 DIAGNOSIS — O10919 Unspecified pre-existing hypertension complicating pregnancy, unspecified trimester: Secondary | ICD-10-CM

## 2018-12-05 DIAGNOSIS — O34219 Maternal care for unspecified type scar from previous cesarean delivery: Secondary | ICD-10-CM

## 2018-12-05 IMAGING — US US FETAL BPP W/ NON-STRESS
1 series · 13 of 14 positions shown · non-contrast
Comparison: none

[Series 1: us fetal bpp w/nonstress · 14 acquisitions, 13 frames shown]
[im 1/14]
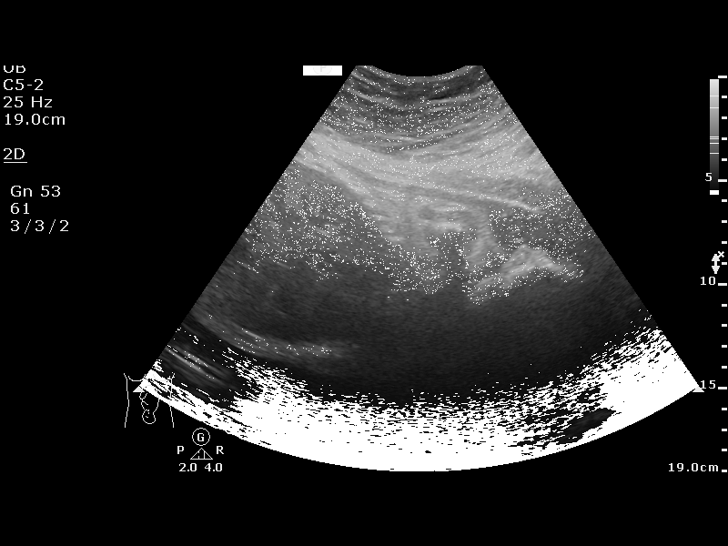
[im 2/14]
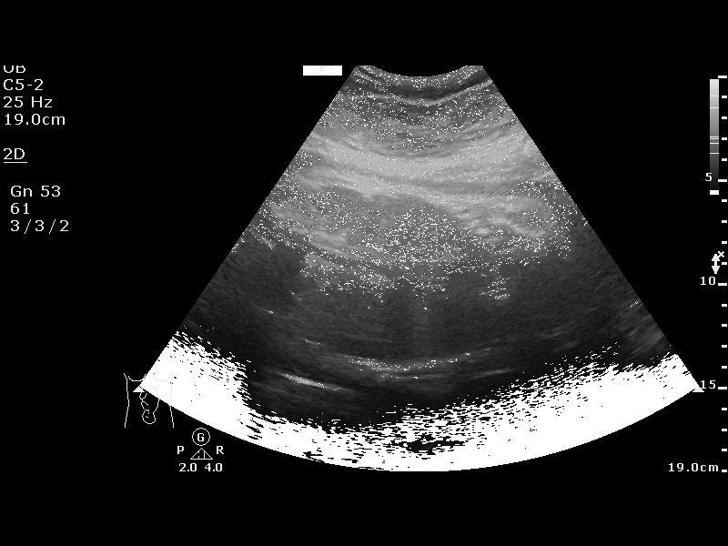
[im 3/14]
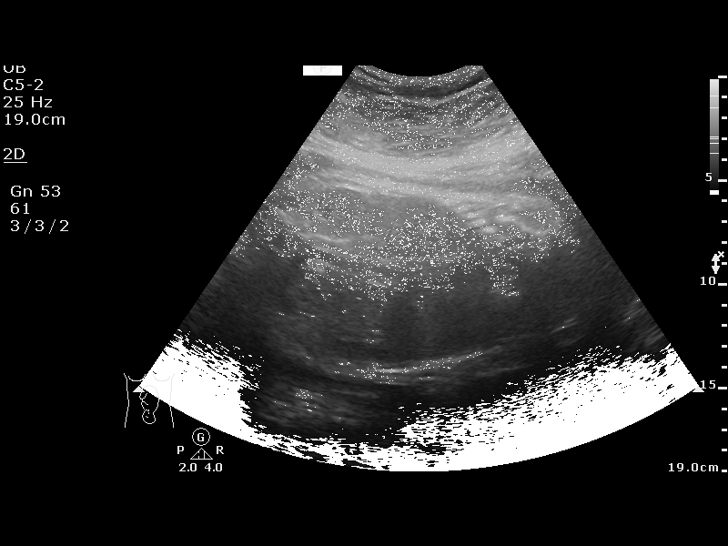
[im 4/14]
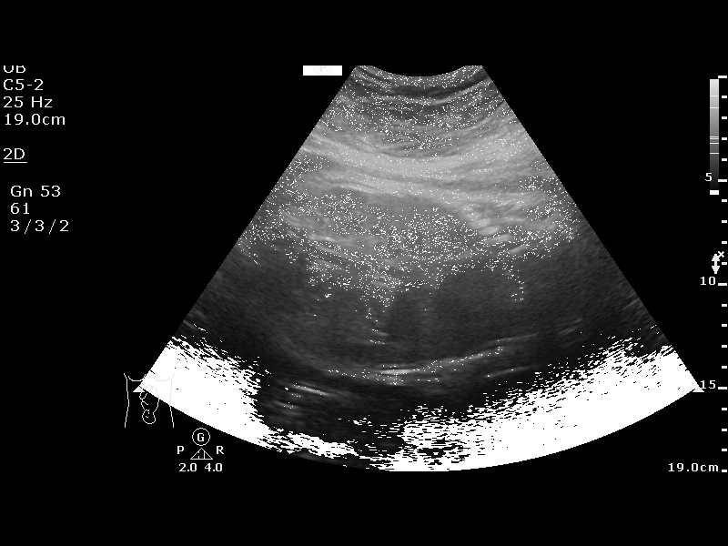
[im 5/14]
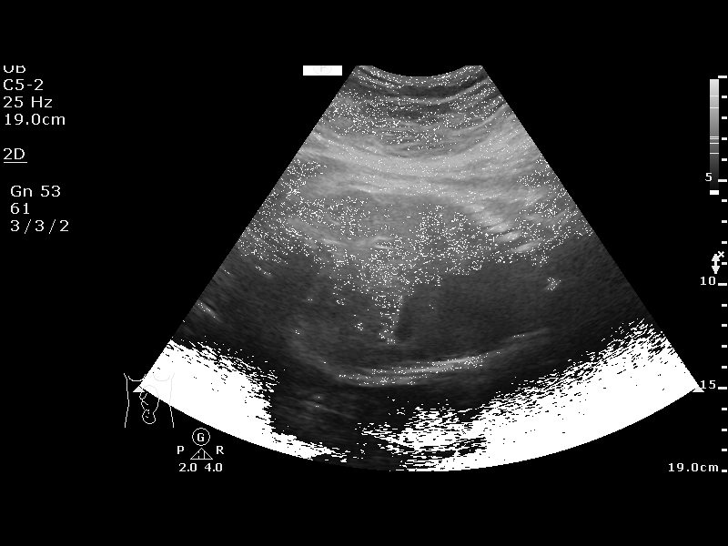
[im 6/14]
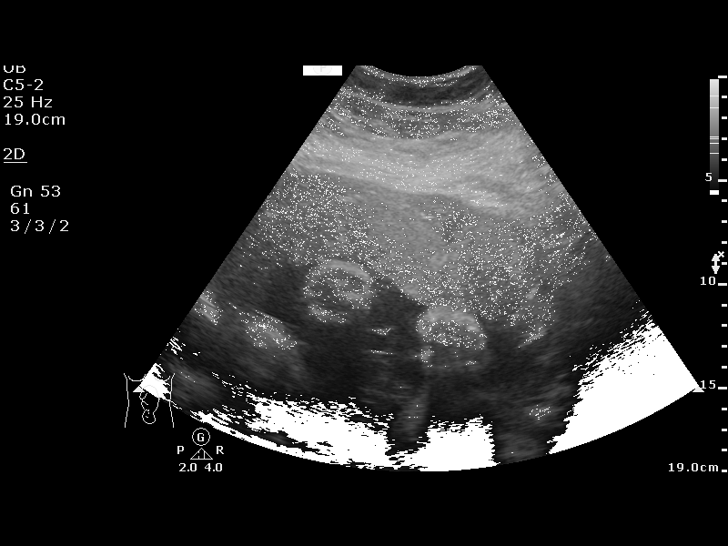
[im 8/14]
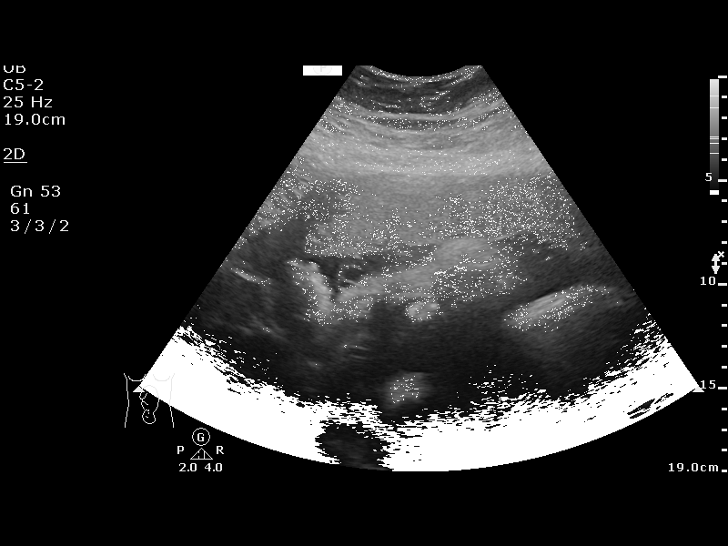
[im 9/14]
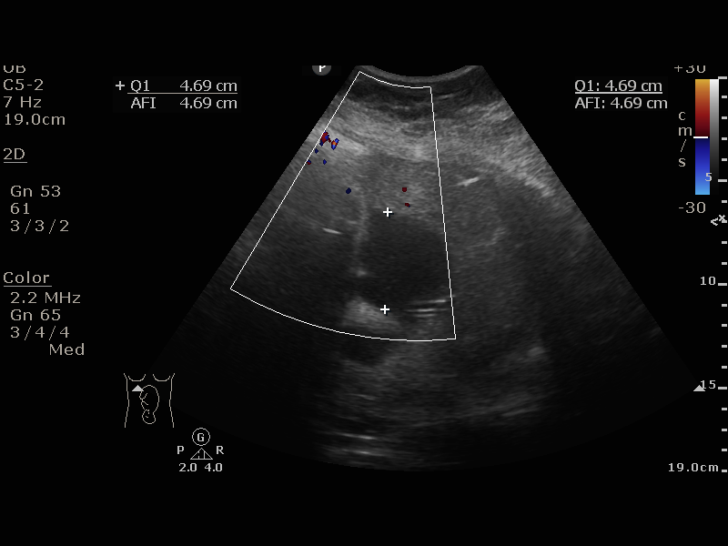
[im 10/14]
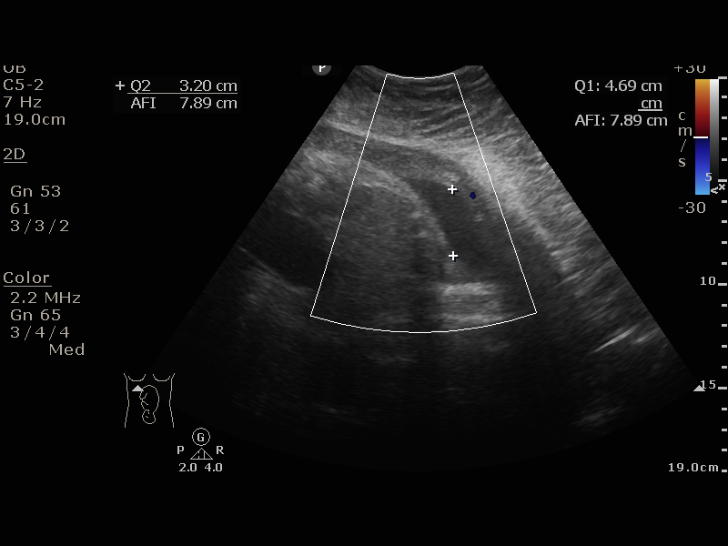
[im 11/14]
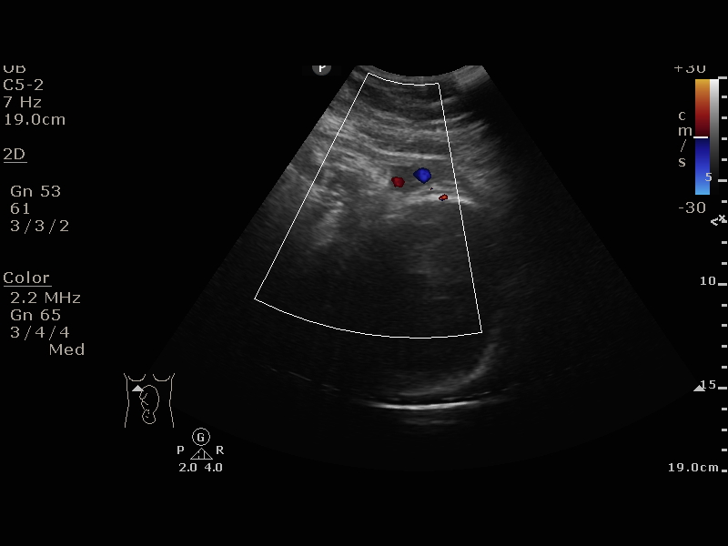
[im 12/14]
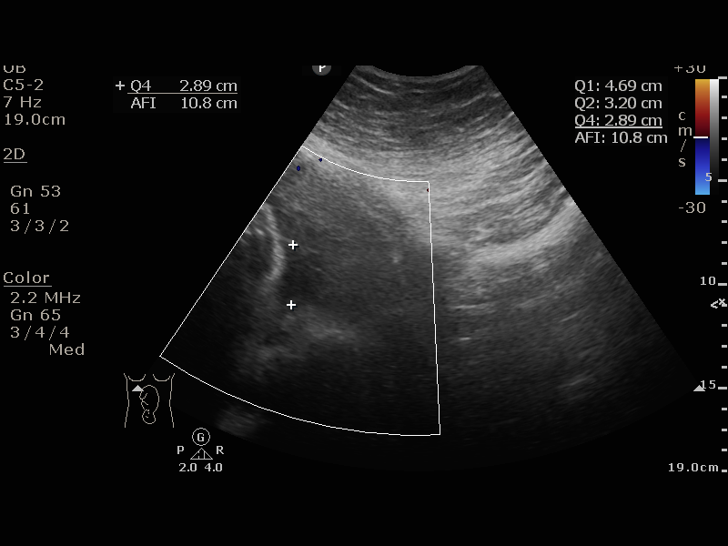
[im 13/14]
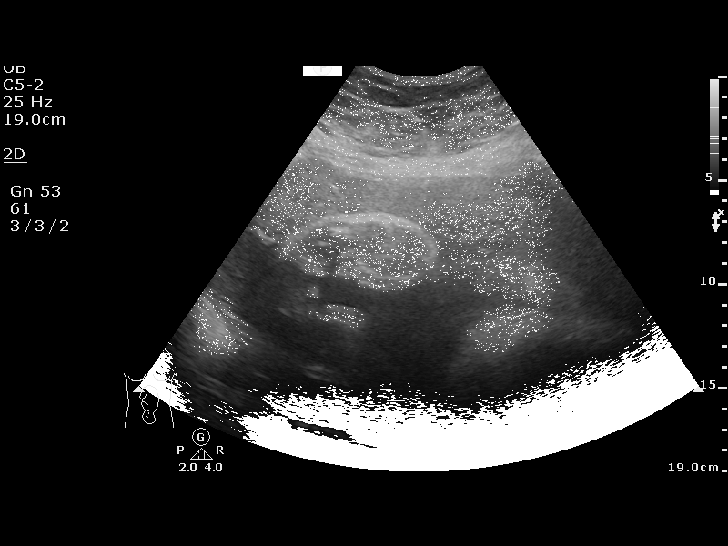
[im 14/14]
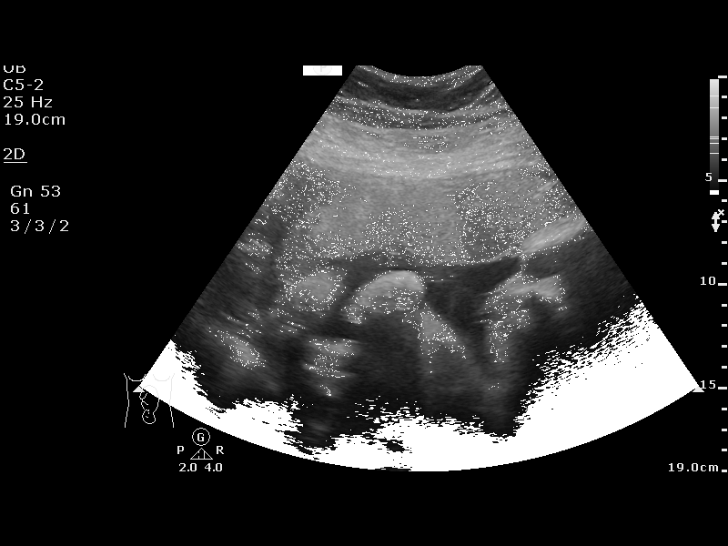

[13 of 14 positions shown; findings below may reference images not displayed]

OB/Gyn Clinic
                                                            [facility]
                                                            [REDACTED]

  1  US FETAL BPP W/NONSTRESS             76818.4      DELOWR
 ----------------------------------------------------------------------

 ----------------------------------------------------------------------
Service(s) Provided

 ----------------------------------------------------------------------
Indications

  34 weeks gestation of pregnancy
  Unspecified pre-existing hypertension          [S2]
  complicating pregnancy, third trimester
  Gestational diabetes in pregnancy,             [S2]
  controlled by oral hypoglycemic drugs
 ----------------------------------------------------------------------
Vital Signs

                                                Height:        5'0"
Fetal Evaluation

 Num Of Fetuses:         1
 Preg. Location:         Intrauterine
 Cardiac Activity:       Observed
 Presentation:           Cephalic

 Amniotic Fluid
 AFI FV:      Within normal limits

 AFI Sum(cm)     %Tile       Largest Pocket(cm)
 10.78           26
 RUQ(cm)       RLQ(cm)       LUQ(cm)        LLQ(cm)
 4.69          2.89          3.2            0
Biophysical Evaluation

 Amniotic F.V:   Pocket => 2 cm two         F. Tone:        Observed
                 planes
 F. Movement:    Observed                   N.S.T:          Reactive
 F. Breathing:   Observed                   Score:          [DATE]
OB History

 Gravidity:    2         Term:   1        Prem:   0        SAB:   0
 TOP:          0       Ectopic:  0        Living: 1
Gestational Age

 LMP:           29w 6d        Date:  [DATE]                 EDD:   [DATE]
 Best:          34w 5d     Det. By:  Early Ultrasound         EDD:   [DATE]
                                     ([DATE])
Impression

 Viable fetus in cephalic presentation
Recommendations

 Continue weekly antenatal testing till delivery.
               DELOWR

## 2018-12-05 NOTE — Progress Notes (Signed)

## 2018-12-05 NOTE — Progress Notes (Signed)
Pt reports increased pelvic pressure and UC's. She has also observed decreased fetal movement since yesterday.

## 2018-12-05 NOTE — Progress Notes (Signed)
   PRENATAL VISIT NOTE  Subjective:  Alexandra Gray is a 28 y.o. G2P1001 at [redacted]w[redacted]d being seen today for ongoing prenatal care.  She is currently monitored for the following issues for this high-risk pregnancy and has MDD (major depressive disorder), recurrent severe, without psychosis (HCC); Suicidal ideation; Supervision of high risk pregnancy, antepartum; History of cesarean delivery affecting pregnancy; History of pregnancy induced hypertension; Morbid obesity (HCC); Obesity in pregnancy; Elevated fasting blood sugar; Chronic hypertension during pregnancy, antepartum; Gestational diabetes; Cluster B personality disorder (HCC); PTSD (post-traumatic stress disorder); Sleep apnea; and Severe major depression without psychotic features (HCC) on their problem list.  Patient reports some headaches.  Contractions: Irregular. Vag. Bleeding: None.  Movement: (!) Decreased. Denies leaking of fluid.   The following portions of the patient's history were reviewed and updated as appropriate: allergies, current medications, past family history, past medical history, past social history, past surgical history and problem list. Problem list updated.  Objective:   Vitals:   12/05/18 0936  BP: 137/73  Pulse: 98  Weight: 289 lb 9.6 oz (131.4 kg)    Fetal Status: Fetal Heart Rate (bpm): NST   Movement: (!) Decreased     General:  Alert, oriented and cooperative. Patient is in no acute distress.  Skin: Skin is warm and dry. No rash noted.   Cardiovascular: Normal heart rate noted  Respiratory: Normal respiratory effort, no problems with respiration noted  Abdomen: Soft, gravid, appropriate for gestational age.  Pain/Pressure: Present     Pelvic: Cervical exam deferred        Extremities: Normal range of motion.  Edema: Trace  Mental Status: Normal mood and affect. Normal behavior. Normal judgment and thought content.   Assessment and Plan:  Pregnancy: G2P1001 at [redacted]w[redacted]d  1. Supervision of high risk  pregnancy, antepartum Patient is doing well  2. Gestational diabetes mellitus (GDM) in third trimester controlled on oral hypoglycemic drug Patient started on glyburide last visit. Fasting continue to be elevated 80-138. Will increase glyburide to 5 mg qHS Continue antenatal testing  3. Chronic hypertension during pregnancy, antepartum Normotensive today without medication Patient reports frequent HA at home. She reports BP as high as 140/80 at home She denies any other symptoms Continue close monitoring 1/3 EFW 2602 gm   4. History of cesarean delivery affecting pregnancy Patient scheduled for repeat c-section  Preterm labor symptoms and general obstetric precautions including but not limited to vaginal bleeding, contractions, leaking of fluid and fetal movement were reviewed in detail with the patient. Please refer to After Visit Summary for other counseling recommendations.  Return in about 1 week (around 12/12/2018) for weekly HOB and NST/BPP.  Future Appointments  Date Time Provider Department Center  12/12/2018  9:15 AM [facility]  12/12/2018 10:15 AM Allie Bossier, MD [facility]  12/13/2018 11:20 AM Revankar, Aundra Dubin, MD [facility] None    Catalina Antigua, MD

## 2018-12-06 ENCOUNTER — Encounter: Payer: Self-pay | Admitting: Obstetrics & Gynecology

## 2018-12-07 ENCOUNTER — Other Ambulatory Visit: Payer: Self-pay

## 2018-12-07 ENCOUNTER — Encounter (HOSPITAL_COMMUNITY): Payer: Self-pay

## 2018-12-07 ENCOUNTER — Inpatient Hospital Stay (HOSPITAL_COMMUNITY)
Admission: AD | Admit: 2018-12-07 | Discharge: 2018-12-07 | Disposition: A | Discharge status: Home / Self Care | Payer: Medicaid Other | Source: Ambulatory Visit | Attending: Family Medicine | Admitting: Family Medicine

## 2018-12-07 DIAGNOSIS — O99210 Obesity complicating pregnancy, unspecified trimester: Secondary | ICD-10-CM

## 2018-12-07 DIAGNOSIS — O0990 Supervision of high risk pregnancy, unspecified, unspecified trimester: Secondary | ICD-10-CM

## 2018-12-07 DIAGNOSIS — O4703 False labor before 37 completed weeks of gestation, third trimester: Secondary | ICD-10-CM | POA: Insufficient documentation

## 2018-12-07 DIAGNOSIS — R109 Unspecified abdominal pain: Secondary | ICD-10-CM | POA: Diagnosis not present

## 2018-12-07 DIAGNOSIS — Z3A35 35 weeks gestation of pregnancy: Secondary | ICD-10-CM | POA: Insufficient documentation

## 2018-12-07 DIAGNOSIS — G44209 Tension-type headache, unspecified, not intractable: Secondary | ICD-10-CM

## 2018-12-07 DIAGNOSIS — O24415 Gestational diabetes mellitus in pregnancy, controlled by oral hypoglycemic drugs: Secondary | ICD-10-CM

## 2018-12-07 DIAGNOSIS — O479 False labor, unspecified: Secondary | ICD-10-CM

## 2018-12-07 DIAGNOSIS — R51 Headache: Secondary | ICD-10-CM | POA: Diagnosis present

## 2018-12-07 DIAGNOSIS — O10919 Unspecified pre-existing hypertension complicating pregnancy, unspecified trimester: Secondary | ICD-10-CM

## 2018-12-07 DIAGNOSIS — N949 Unspecified condition associated with female genital organs and menstrual cycle: Secondary | ICD-10-CM

## 2018-12-07 DIAGNOSIS — O10013 Pre-existing essential hypertension complicating pregnancy, third trimester: Secondary | ICD-10-CM | POA: Insufficient documentation

## 2018-12-07 LAB — URINALYSIS, ROUTINE W REFLEX MICROSCOPIC
Bacteria, UA: NONE SEEN
Bilirubin Urine: NEGATIVE
GLUCOSE, UA: NEGATIVE mg/dL
Hgb urine dipstick: NEGATIVE
Ketones, ur: NEGATIVE mg/dL
Nitrite: NEGATIVE
Protein, ur: 30 mg/dL — AB
SPECIFIC GRAVITY, URINE: 1.03 (ref 1.005–1.030)
pH: 5 (ref 5.0–8.0)

## 2018-12-07 LAB — PROTEIN / CREATININE RATIO, URINE
Creatinine, Urine: 277 mg/dL
PROTEIN CREATININE RATIO: 0.12 mg/mg{creat} (ref 0.00–0.15)
Total Protein, Urine: 33 mg/dL

## 2018-12-07 LAB — COMPREHENSIVE METABOLIC PANEL
ALT: 14 U/L (ref 0–44)
AST: 16 U/L (ref 15–41)
Albumin: 3 g/dL — ABNORMAL LOW (ref 3.5–5.0)
Alkaline Phosphatase: 119 U/L (ref 38–126)
Anion gap: 9 (ref 5–15)
BUN: 6 mg/dL (ref 6–20)
CO2: 21 mmol/L — ABNORMAL LOW (ref 22–32)
Calcium: 9.3 mg/dL (ref 8.9–10.3)
Chloride: 105 mmol/L (ref 98–111)
Creatinine, Ser: 0.49 mg/dL (ref 0.44–1.00)
GFR calc Af Amer: >60 mL/min (ref >60)
GFR calc non Af Amer: >60 mL/min (ref >60)
Glucose, Bld: 82 mg/dL (ref 70–99)
Potassium: 3.9 mmol/L (ref 3.5–5.1)
Sodium: 135 mmol/L (ref 135–145)
Total Bilirubin: <0.1 mg/dL — ABNORMAL LOW (ref 0.3–1.2)
Total Protein: 6.5 g/dL (ref 6.5–8.1)

## 2018-12-07 LAB — CBC WITH DIFFERENTIAL/PLATELET
BASOS ABS: 0 10*3/uL (ref 0.0–0.1)
Basophils Relative: 0 %
Eosinophils Absolute: 0.1 10*3/uL (ref 0.0–0.5)
Eosinophils Relative: 1 %
HCT: 39.8 % (ref 36.0–46.0)
Hemoglobin: 13 g/dL (ref 12.0–15.0)
Lymphocytes Relative: 27 %
Lymphs Abs: 2.4 10*3/uL (ref 0.7–4.0)
MCH: 26.5 pg (ref 26.0–34.0)
MCHC: 32.7 g/dL (ref 30.0–36.0)
MCV: 81.2 fL (ref 80.0–100.0)
Monocytes Absolute: 0.4 10*3/uL (ref 0.1–1.0)
Monocytes Relative: 5 %
NRBC: 0 % (ref 0.0–0.2)
Neutro Abs: 6.1 10*3/uL (ref 1.7–7.7)
Neutrophils Relative %: 67 %
PLATELETS: 209 10*3/uL (ref 150–400)
RBC: 4.9 MIL/uL (ref 3.87–5.11)
RDW: 15 % (ref 11.5–15.5)
WBC: 9.1 10*3/uL (ref 4.0–10.5)

## 2018-12-07 MED ORDER — LACTATED RINGERS IV BOLUS
1000.0000 mL | Freq: Once | INTRAVENOUS | Status: AC
  Administered 2018-12-07: 1000 mL via INTRAVENOUS

## 2018-12-07 MED ORDER — CYCLOBENZAPRINE HCL 10 MG PO TABS
10.0000 mg | ORAL_TABLET | Freq: Once | ORAL | Status: AC
  Administered 2018-12-07: 10 mg via ORAL
  Filled 2018-12-07: qty 1

## 2018-12-07 MED ORDER — ONDANSETRON HCL 4 MG/2ML IJ SOLN
4.0000 mg | Freq: Once | INTRAMUSCULAR | Status: AC
  Administered 2018-12-07: 4 mg via INTRAVENOUS
  Filled 2018-12-07: qty 2

## 2018-12-07 NOTE — Discharge Instructions (Signed)
Braxton Hicks Contractions °Contractions of the uterus can occur throughout pregnancy, but they are not always a sign that you are in labor. You may have practice contractions called Braxton Hicks contractions. These false labor contractions are sometimes confused with true labor. °What are Braxton Hicks contractions? °Braxton Hicks contractions are tightening movements that occur in the muscles of the uterus before labor. Unlike true labor contractions, these contractions do not result in opening (dilation) and thinning of the cervix. Toward the end of pregnancy (32-34 weeks), Braxton Hicks contractions can happen more often and may become stronger. These contractions are sometimes difficult to tell apart from true labor because they can be very uncomfortable. You should not feel embarrassed if you go to the hospital with false labor. °Sometimes, the only way to tell if you are in true labor is for your health care provider to look for changes in the cervix. The health care provider will do a physical exam and may monitor your contractions. If you are not in true labor, the exam should show that your cervix is not dilating and your water has not broken. °If there are no other health problems associated with your pregnancy, it is completely safe for you to be sent home with false labor. You may continue to have Braxton Hicks contractions until you go into true labor. °How to tell the difference between true labor and false labor °True labor °· Contractions last 30-70 seconds. °· Contractions become very regular. °· Discomfort is usually felt in the top of the uterus, and it spreads to the lower abdomen and low back. °· Contractions do not go away with walking. °· Contractions usually become more intense and increase in frequency. °· The cervix dilates and gets thinner. °False labor °· Contractions are usually shorter and not as strong as true labor contractions. °· Contractions are usually irregular. °· Contractions  are often felt in the front of the lower abdomen and in the groin. °· Contractions may go away when you walk around or change positions while lying down. °· Contractions get weaker and are shorter-lasting as time goes on. °· The cervix usually does not dilate or become thin. °Follow these instructions at home: ° °· Take over-the-counter and prescription medicines only as told by your health care provider. °· Keep up with your usual exercises and follow other instructions from your health care provider. °· Eat and drink lightly if you think you are going into labor. °· If Braxton Hicks contractions are making you uncomfortable: °? Change your position from lying down or resting to walking, or change from walking to resting. °? Sit and rest in a tub of warm water. °? Drink enough fluid to keep your urine pale yellow. Dehydration may cause these contractions. °? Do slow and deep breathing several times an hour. °· Keep all follow-up prenatal visits as told by your health care provider. This is important. °Contact a health care provider if: °· You have a fever. °· You have continuous pain in your abdomen. °Get help right away if: °· Your contractions become stronger, more regular, and closer together. °· You have fluid leaking or gushing from your vagina. °· You pass blood-tinged mucus (bloody show). °· You have bleeding from your vagina. °· You have low back pain that you never had before. °· You feel your baby’s head pushing down and causing pelvic pressure. °· Your baby is not moving inside you as much as it used to. °Summary °· Contractions that occur before labor are   called Braxton Hicks contractions, false labor, or practice contractions.  Braxton Hicks contractions are usually shorter, weaker, farther apart, and less regular than true labor contractions. True labor contractions usually become progressively stronger and regular, and they become more frequent.  Manage discomfort from [facility] contractions  by changing position, resting in a warm bath, drinking plenty of water, or practicing deep breathing. This information is not intended to replace advice given to you by your health care provider. Make sure you discuss any questions you have with your health care provider. Document Released: 03/29/2017 Document Revised: 08/28/2017 Document Reviewed: 03/29/2017 Elsevier Interactive Patient Education  2019 [employer]  Hypertension During Pregnancy  Hypertension is also called high blood pressure. High blood pressure means that the force of your blood moving in your body is too strong. When you are pregnant, this condition should be watched carefully. It can cause problems for you and your baby. Follow these instructions at home: Eating and drinking   Drink enough fluid to keep your pee (urine) pale yellow.  Avoid caffeine. Lifestyle  Do not use any products that contain nicotine or tobacco, such as cigarettes and e-cigarettes. If you need help quitting, ask your doctor.  Do not use alcohol or drugs.  Avoid stress.  Rest and get plenty of sleep. General instructions  Take over-the-counter and prescription medicines only as told by your doctor.  While lying down, lie on your left side. This keeps pressure off your major blood vessels.  While sitting or lying down, raise (elevate) your feet. Try putting some pillows under your lower legs.  Exercise regularly. Ask your doctor what kinds of exercise are best for you.  Keep all prenatal and follow-up visits as told by your doctor. This is important. Contact a doctor if:  You have symptoms that your doctor told you to watch for, such as: ? Throwing up (vomiting). ? Feeling sick to your stomach (nausea). ? Headache. Get help right away if you have:  Very bad belly pain that does not get better with treatment.  A very bad headache that does not get better.  Throwing up that does not get better with treatment.  Sudden, fast  weight gain.  Sudden swelling in your hands, ankles, or face.  Bleeding from your vagina.  Blood in your pee.  Fewer movements from your baby than usual.  Blurry vision.  Double vision.  Muscle twitching.  Sudden muscle tightening (spasms).  Trouble breathing.  Blue fingernails or lips. Summary  Hypertension is also called high blood pressure. High blood pressure means that the force of your blood moving in your body is too strong.  When you are pregnant, this condition should be watched carefully. It can cause problems for you and your baby.  Get help right away if you have symptoms that your doctor told you to watch for. This information is not intended to replace advice given to you by your health care provider. Make sure you discuss any questions you have with your health care provider. Document Released: 12/16/2010 Document Revised: 10/30/2017 Document Reviewed: 07/25/2016 Elsevier Interactive Patient Education  2019 [employer]  Fetal Movement Counts Patient Name: ________________________________________________ Patient Due Date: ____________________ What is a fetal movement count?  A fetal movement count is the number of times that you feel your baby move during a certain amount of time. This may also be called a fetal kick count. A fetal movement count is recommended for every pregnant woman. You may be asked to start counting  fetal movements as early as week 28 of your pregnancy. Pay attention to when your baby is most active. You may notice your baby's sleep and wake cycles. You may also notice things that make your baby move more. You should do a fetal movement count:  When your baby is normally most active.  At the same time each day. A good time to count movements is while you are resting, after having something to eat and drink. How do I count fetal movements? 1. Find a quiet, comfortable area. Sit, or lie down on your side. 2. Write down the date, the  start time and stop time, and the number of movements that you felt between those two times. Take this information with you to your health care visits. 3. For 2 hours, count kicks, flutters, swishes, rolls, and jabs. You should feel at least 10 movements during 2 hours. 4. You may stop counting after you have felt 10 movements. 5. If you do not feel 10 movements in 2 hours, have something to eat and drink. Then, keep resting and counting for 1 hour. If you feel at least 4 movements during that hour, you may stop counting. Contact a health care provider if:  You feel fewer than 4 movements in 2 hours.  Your baby is not moving like he or she usually does. Date: ____________ Start time: ____________ Stop time: ____________ Movements: ____________ Date: ____________ Start time: ____________ Stop time: ____________ Movements: ____________ Date: ____________ Start time: ____________ Stop time: ____________ Movements: ____________ Date: ____________ Start time: ____________ Stop time: ____________ Movements: ____________ Date: ____________ Start time: ____________ Stop time: ____________ Movements: ____________ Date: ____________ Start time: ____________ Stop time: ____________ Movements: ____________ Date: ____________ Start time: ____________ Stop time: ____________ Movements: ____________ Date: ____________ Start time: ____________ Stop time: ____________ Movements: ____________ Date: ____________ Start time: ____________ Stop time: ____________ Movements: ____________ This information is not intended to replace advice given to you by your health care provider. Make sure you discuss any questions you have with your health care provider. Document Released: 12/13/2006 Document Revised: 07/12/2016 Document Reviewed: 12/23/2015 Elsevier Interactive Patient Education  2019 [employer].

## 2018-12-07 NOTE — MAU Provider Note (Signed)
History     CSN: [id number]  Arrival date and time: 12/07/18 1606   First Provider Initiated Contact with Patient 12/07/18 1702      Chief Complaint  Patient presents with  . Headache  . Contractions  . Hyperglycemia   HPI  Alexandra Gray is a 28 y.o. G2P1001 at 7157w0d who presents to MAU today with complaint of headache and abdominal pain. The patient has GDM and CHTN. She is not currently on anti-hypertensive medications. She also states N/V today. She denies diarrhea or sick contacts. She feels like she is having contractions, but when asked about frequency she says the tightness lasts for an hour. She reports low fetal movement but same as usual.   OB History    Gravida  2   Para  1   Term  1   Preterm      AB      Living  1     SAB      TAB      Ectopic      Multiple  0   Live Births  1           Past Medical History:  Diagnosis Date  . Chlamydia   . Depression    doing ok now  . Gonorrhea   . Hx of trichomoniasis   . Hypertension    sometimes is up, never on meds  . Mental disorder   . Ovarian cyst   . PID (pelvic inflammatory disease)   . Sleep apnea     Past Surgical History:  Procedure Laterality Date  . CESAREAN SECTION N/A 10/03/2015   Procedure: CESAREAN SECTION;  Surgeon: Tilda BurrowJohn Ferguson V, MD;  Location: [facility];  Service: Obstetrics;  Laterality: N/A;  . KNEE SURGERY      Family History  Problem Relation Age of Onset  . Healthy Mother   . Healthy Father   . Healthy Brother   . Cancer Neg Hx   . Diabetes Neg Hx   . Heart disease Neg Hx   . Hypertension Neg Hx   . Stroke Neg Hx   . Hearing loss Neg Hx     Social History   Tobacco Use  . Smoking status: Never Smoker  . Smokeless tobacco: Never Used  Substance Use Topics  . Alcohol use: No  . Drug use: No    Allergies:  Allergies  Allergen Reactions  . Nubain [Nalbuphine Hcl]     Makes patient hot    Medications Prior to Admission  Medication Sig  Dispense Refill Last Dose  . aspirin EC 81 MG tablet Take 1 tablet (81 mg total) by mouth daily. 90 tablet 3 12/07/2018 at Unknown time  . glyBURIDE (DIABETA) 2.5 MG tablet Take 1 tablet (2.5 mg total) by mouth at bedtime. 30 tablet 0 12/06/2018 at Unknown time  . sertraline (ZOLOFT) 50 MG tablet Take 1 tablet (50 mg total) by mouth daily. 90 tablet 3 12/07/2018 at Unknown time  . ACCU-CHEK FASTCLIX LANCETS MISC 1 Device by Percutaneous route 4 (four) times daily. 100 each 12 Taking  . butalbital-acetaminophen-caffeine (FIORICET, ESGIC) 50-325-40 MG tablet Take 1-2 tablets by mouth every 6 (six) hours as needed for headache. (Patient not taking: Reported on 12/05/2018) 20 tablet 2 Not Taking  . cyclobenzaprine (FLEXERIL) 10 MG tablet Take 1 tablet (10 mg total) by mouth 2 (two) times daily as needed for muscle spasms. (Patient not taking: Reported on 11/21/2018) 20 tablet 0 Not Taking  .  glucose blood (ACCU-CHEK GUIDE) test strip Use as instructed QID 100 each 12 Taking  . lidocaine (XYLOCAINE) 2 % solution Use as directed 15 mLs in the mouth or throat as needed for mouth pain. (Patient not taking: Reported on 11/21/2018) 100 mL 2 Not Taking  . Prenatal Vit-Fe Fumarate-FA (MULTIVITAMIN-PRENATAL) 27-0.8 MG TABS tablet Take 1 tablet by mouth daily at 12 noon.   Taking  . promethazine (PHENERGAN) 25 MG tablet Take 1 tablet (25 mg total) by mouth every 6 (six) hours as needed for nausea or vomiting. (Patient not taking: Reported on 11/21/2018) 30 tablet 1 Not Taking    Review of Systems  Constitutional: Negative for fever.  Eyes: Positive for visual disturbance.  Cardiovascular: Positive for leg swelling.  Gastrointestinal: Positive for abdominal pain, nausea and vomiting. Negative for constipation and diarrhea.  Genitourinary: Negative for dysuria, frequency, urgency, vaginal bleeding and vaginal discharge.  Neurological: Positive for headaches.   Physical Exam   Blood pressure 129/80, pulse 84,  temperature 98.4 F (36.9 C), temperature source Oral, resp. rate 20, height 5' (1.524 m), weight 132.1 kg, last menstrual period 05/10/2018, SpO2 99 %, currently breastfeeding.  Physical Exam  Nursing note and vitals reviewed. Constitutional: She is oriented to person, place, and time. She appears well-developed and well-nourished. No distress.  HENT:  Head: Normocephalic and atraumatic.  Cardiovascular: Normal rate.  Respiratory: Effort normal.  GI: Soft. She exhibits no distension and no mass. There is no abdominal tenderness. There is no rebound and no guarding.  Musculoskeletal:        General: No edema.  Neurological: She is alert and oriented to person, place, and time.  Skin: Skin is warm and dry. No erythema.  Psychiatric: She has a normal mood and affect.  Dilation: Closed Effacement (%): Thick Cervical Position: Posterior Station: -2 Exam by:: Vonzella Nipple, PA-C    Results for orders placed or performed during the hospital encounter of 12/07/18 (from the past 24 hour(s))  Protein / creatinine ratio, urine     Status: None   Collection Time: 12/07/18  5:12 PM  Result Value Ref Range   Creatinine, Urine 277.00 mg/dL   Total Protein, Urine 33 mg/dL   Protein Creatinine Ratio 0.12 0.00 - 0.15 mg/mg[Cre]  Urinalysis, Routine w reflex microscopic     Status: Abnormal   Collection Time: 12/07/18  5:12 PM  Result Value Ref Range   Color, Urine AMBER (A) YELLOW   APPearance CLOUDY (A) CLEAR   Specific Gravity, Urine 1.030 1.005 - 1.030   pH 5.0 5.0 - 8.0   Glucose, UA NEGATIVE NEGATIVE mg/dL   Hgb urine dipstick NEGATIVE NEGATIVE   Bilirubin Urine NEGATIVE NEGATIVE   Ketones, ur NEGATIVE NEGATIVE mg/dL   Protein, ur 30 (A) NEGATIVE mg/dL   Nitrite NEGATIVE NEGATIVE   Leukocytes, UA MODERATE (A) NEGATIVE   RBC / HPF 0-5 0 - 5 RBC/hpf   WBC, UA 0-5 0 - 5 WBC/hpf   Bacteria, UA NONE SEEN NONE SEEN   Squamous Epithelial / LPF 21-50 0 - 5   Mucus PRESENT   CBC with  Differential/Platelet     Status: None   Collection Time: 12/07/18  5:21 PM  Result Value Ref Range   WBC 9.1 4.0 - 10.5 K/uL   RBC 4.90 3.87 - 5.11 MIL/uL   Hemoglobin 13.0 12.0 - 15.0 g/dL   HCT 64.6 80.3 - 21.2 %   MCV 81.2 80.0 - 100.0 fL   MCH 26.5 26.0 - 34.0  pg   MCHC 32.7 30.0 - 36.0 g/dL   RDW 40.9 73.5 - 32.9 %   Platelets 209 150 - 400 K/uL   nRBC 0.0 0.0 - 0.2 %   Neutrophils Relative % 67 %   Neutro Abs 6.1 1.7 - 7.7 K/uL   Lymphocytes Relative 27 %   Lymphs Abs 2.4 0.7 - 4.0 K/uL   Monocytes Relative 5 %   Monocytes Absolute 0.4 0.1 - 1.0 K/uL   Eosinophils Relative 1 %   Eosinophils Absolute 0.1 0.0 - 0.5 K/uL   Basophils Relative 0 %   Basophils Absolute 0.0 0.0 - 0.1 K/uL  Comprehensive metabolic panel     Status: Abnormal   Collection Time: 12/07/18  5:21 PM  Result Value Ref Range   Sodium 135 135 - 145 mmol/L   Potassium 3.9 3.5 - 5.1 mmol/L   Chloride 105 98 - 111 mmol/L   CO2 21 (L) 22 - 32 mmol/L   Glucose, Bld 82 70 - 99 mg/dL   BUN 6 6 - 20 mg/dL   Creatinine, Ser 9.24 0.44 - 1.00 mg/dL   Calcium 9.3 8.9 - 26.8 mg/dL   Total Protein 6.5 6.5 - 8.1 g/dL   Albumin 3.0 (L) 3.5 - 5.0 g/dL   AST 16 15 - 41 U/L   ALT 14 0 - 44 U/L   Alkaline Phosphatase 119 38 - 126 U/L   Total Bilirubin <0.1 (L) 0.3 - 1.2 mg/dL   GFR calc non Af Amer >60 >60 mL/min   GFR calc Af Amer >60 >60 mL/min   Anion gap 9 5 - 15   Patient Vitals for the past 24 hrs:  BP Temp Temp src Pulse Resp SpO2 Height Weight  12/07/18 1847 129/80 - - 84 - - - -  12/07/18 1832 (!) 150/87 - - 80 - - - -  12/07/18 1815 (!) 144/87 - - 88 - - - -  12/07/18 1747 123/74 - - 91 - - - -  12/07/18 1716 (!) 143/93 - - 91 - - - -  12/07/18 1708 137/78 - - 88 - - - -  12/07/18 1626 139/77 98.4 F (36.9 C) Oral 92 20 99 % 5' (1.524 m) 132.1 kg     Fetal Monitoring: Baseline: 120 bpm Variability: moderate Accelerations: 15 x 15 Decelerations: none Contractions: none, mild UI  MAU Course   Procedures None  MDM IV LR bolus 4 mg IV Zofran  Flexeril given for pain. Patient reports resolution of abdominal pain, normal FM and resolution of headache prior to discharge.  BP mildly elevated. Labs do not indicate pre-eclampsia at this time.   Assessment and Plan  A: SIUP at [redacted]w[redacted]d Headache, resolved  Abdominal pain in pregnancy Braxton hicks contractions  CHTN   P:  Discharge home Tylenol PRN for pain Continue previously prescribed Fioricet as needed  Discussed use of abdominal binder Pre-eclampsia and worsening HTN precautions discussed Patient advised to follow-up with [facility] as scheduled for routine prenatal care Patient may return to MAU as needed or if her condition were to change or worsen  Vonzella Nipple, PA-C 12/07/2018, 7:01 PM

## 2018-12-07 NOTE — MAU Note (Signed)
Been having a really bad headache, all day today, no relief with Tylenol.  Eyes been blurred, denies epigastric pain or increase in swelling. Has GDM, blood sugar has been higher today, last time she checked it was 150.  Takes one pill for that at night.  Having some contractions.

## 2018-12-12 ENCOUNTER — Ambulatory Visit: Payer: Self-pay

## 2018-12-12 ENCOUNTER — Ambulatory Visit (INDEPENDENT_AMBULATORY_CARE_PROVIDER_SITE_OTHER): Payer: Medicaid Other | Admitting: Obstetrics & Gynecology

## 2018-12-12 ENCOUNTER — Encounter: Payer: Medicaid Other | Attending: Obstetrics & Gynecology | Admitting: *Deleted

## 2018-12-12 ENCOUNTER — Ambulatory Visit (INDEPENDENT_AMBULATORY_CARE_PROVIDER_SITE_OTHER): Payer: Medicaid Other | Admitting: *Deleted

## 2018-12-12 ENCOUNTER — Ambulatory Visit: Payer: Medicaid Other | Admitting: *Deleted

## 2018-12-12 VITALS — BP 134/83 | HR 104 | Wt 294.8 lb

## 2018-12-12 DIAGNOSIS — O0993 Supervision of high risk pregnancy, unspecified, third trimester: Secondary | ICD-10-CM

## 2018-12-12 DIAGNOSIS — O24414 Gestational diabetes mellitus in pregnancy, insulin controlled: Secondary | ICD-10-CM | POA: Diagnosis not present

## 2018-12-12 DIAGNOSIS — O24415 Gestational diabetes mellitus in pregnancy, controlled by oral hypoglycemic drugs: Secondary | ICD-10-CM | POA: Diagnosis not present

## 2018-12-12 DIAGNOSIS — O10913 Unspecified pre-existing hypertension complicating pregnancy, third trimester: Secondary | ICD-10-CM

## 2018-12-12 DIAGNOSIS — O10919 Unspecified pre-existing hypertension complicating pregnancy, unspecified trimester: Secondary | ICD-10-CM

## 2018-12-12 DIAGNOSIS — O99213 Obesity complicating pregnancy, third trimester: Secondary | ICD-10-CM

## 2018-12-12 DIAGNOSIS — O99210 Obesity complicating pregnancy, unspecified trimester: Secondary | ICD-10-CM

## 2018-12-12 DIAGNOSIS — Z3A00 Weeks of gestation of pregnancy not specified: Secondary | ICD-10-CM | POA: Insufficient documentation

## 2018-12-12 DIAGNOSIS — Z713 Dietary counseling and surveillance: Secondary | ICD-10-CM | POA: Diagnosis not present

## 2018-12-12 DIAGNOSIS — O34219 Maternal care for unspecified type scar from previous cesarean delivery: Secondary | ICD-10-CM

## 2018-12-12 DIAGNOSIS — O0990 Supervision of high risk pregnancy, unspecified, unspecified trimester: Secondary | ICD-10-CM

## 2018-12-12 LAB — POCT URINALYSIS DIP (DEVICE)
Glucose, UA: NEGATIVE mg/dL
HGB URINE DIPSTICK: NEGATIVE
Ketones, ur: 15 mg/dL — AB
Nitrite: NEGATIVE
Protein, ur: 30 mg/dL — AB
Specific Gravity, Urine: 1.025 (ref 1.005–1.030)
Urobilinogen, UA: 0.2 mg/dL (ref 0.0–1.0)
pH: 5.5 (ref 5.0–8.0)

## 2018-12-12 IMAGING — US US FETAL BPP W/ NON-STRESS
1 series · 13 of 14 positions shown · non-contrast
Comparison: none

[Series 1: us fetal bpp w/nonstress · 14 acquisitions, 13 frames shown]
[im 1/14]
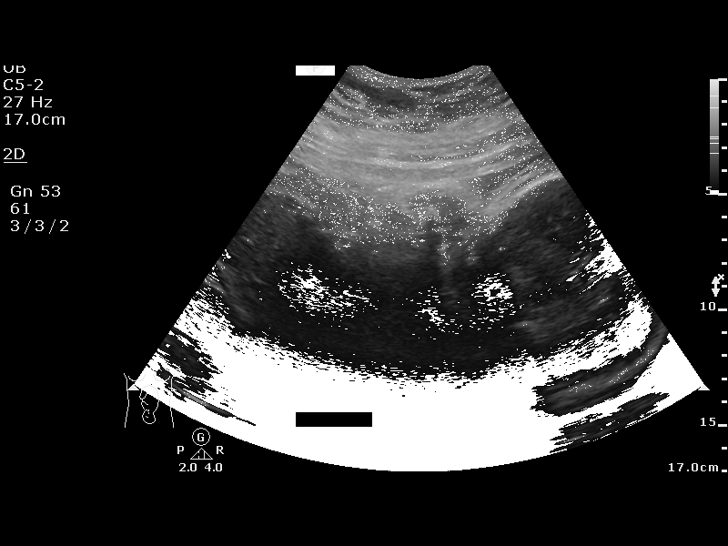
[im 2/14]
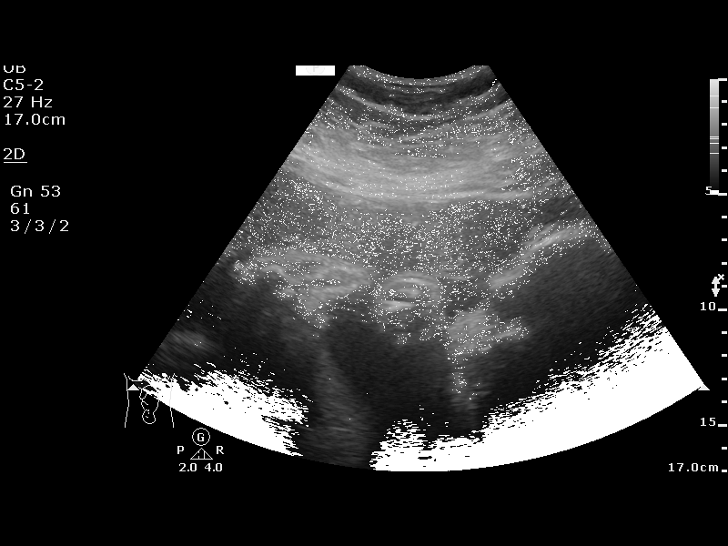
[im 3/14]
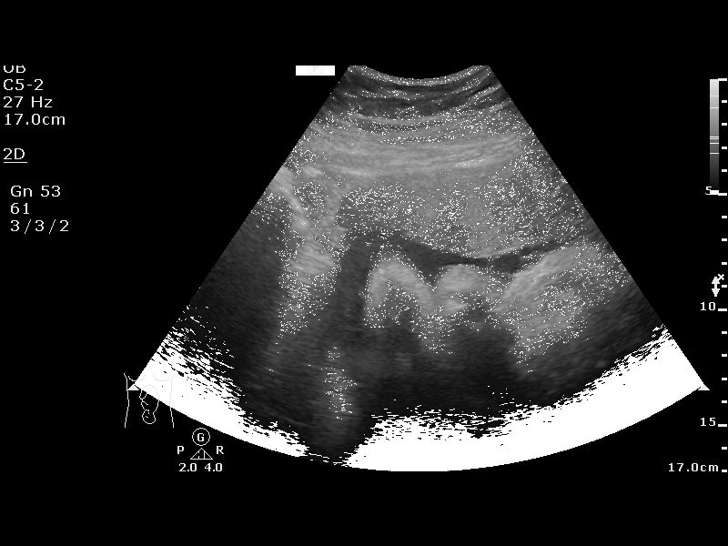
[im 4/14]
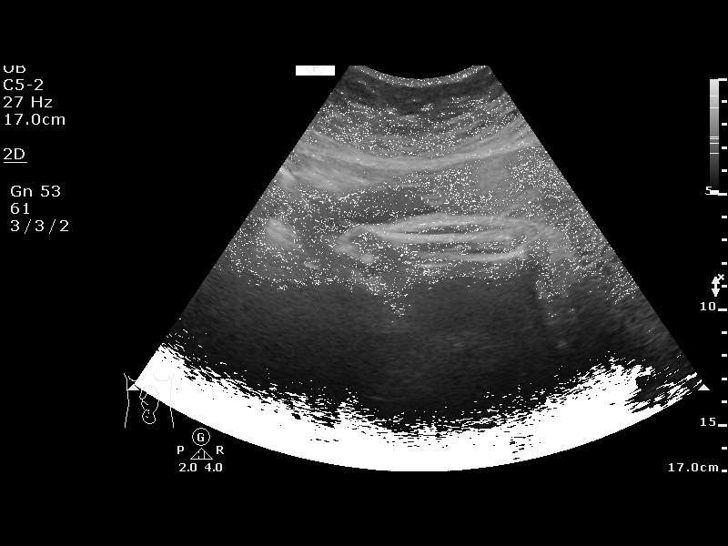
[im 5/14]
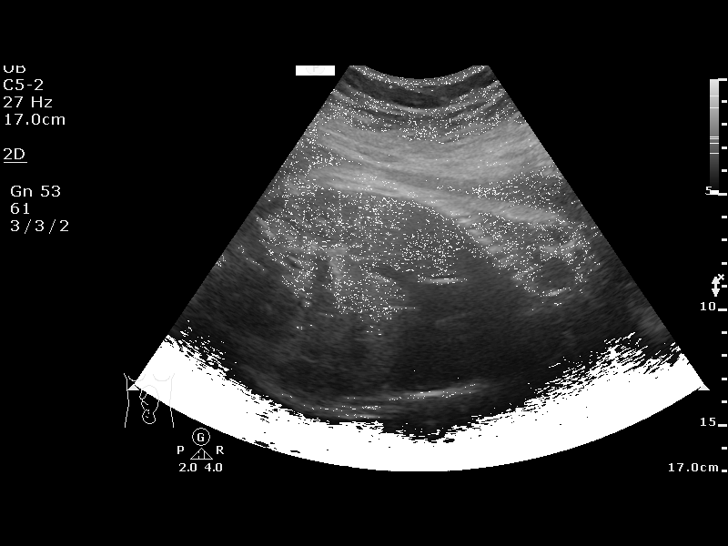
[im 6/14]
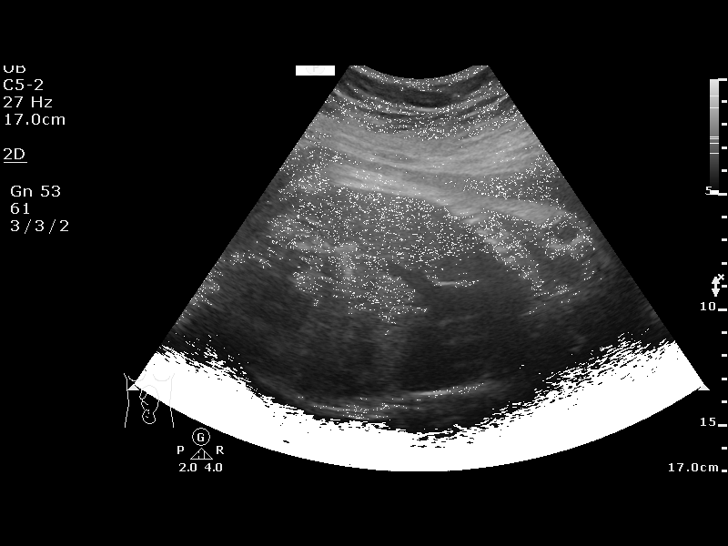
[im 8/14]
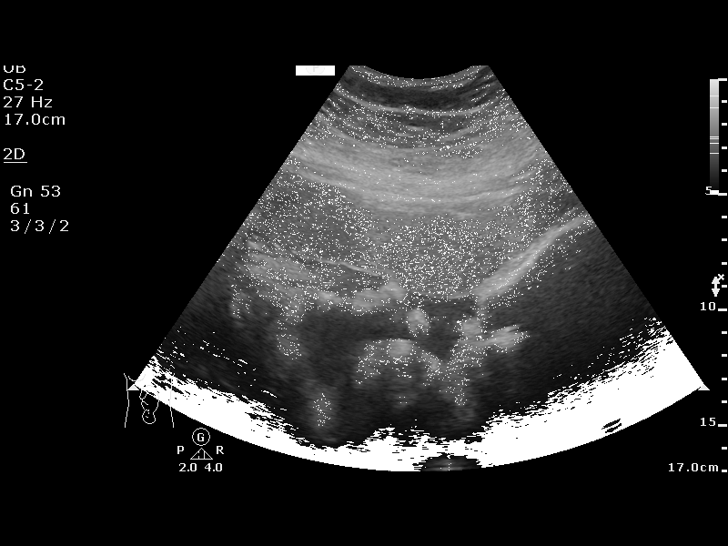
[im 9/14]
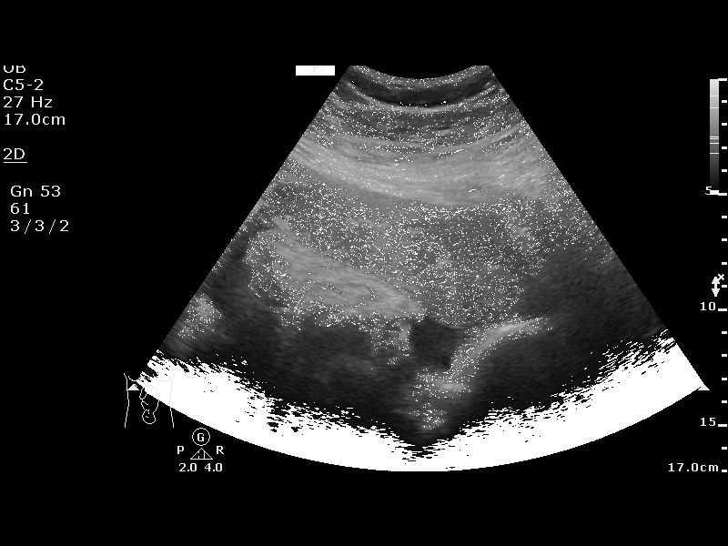
[im 10/14]
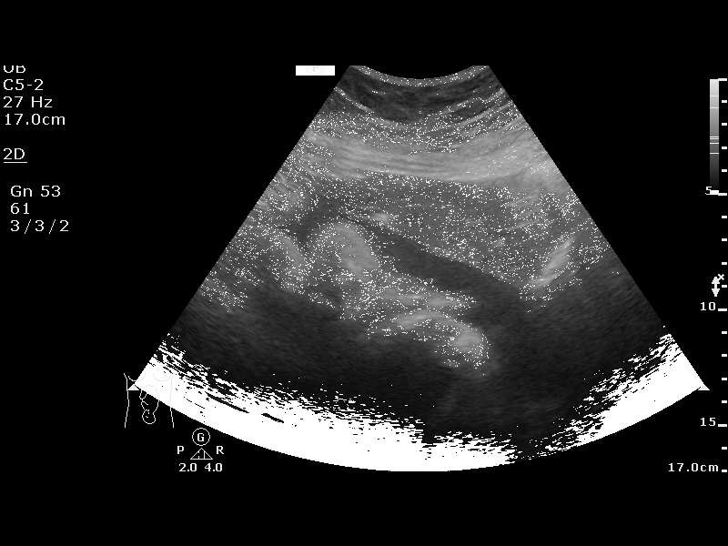
[im 11/14]
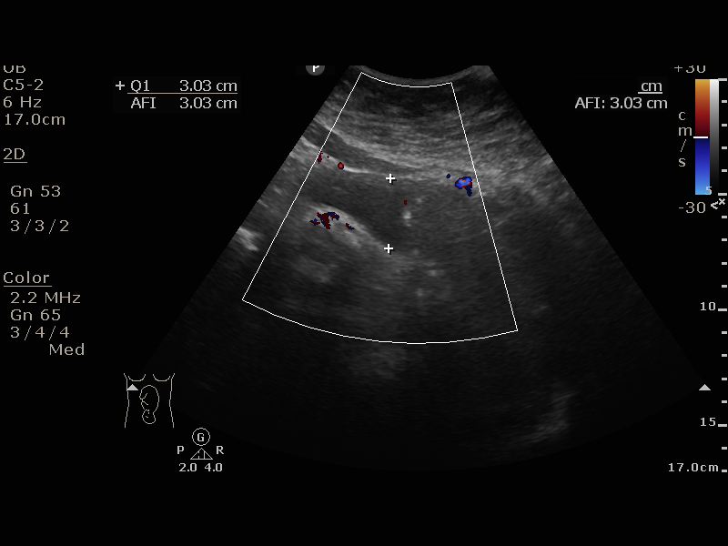
[im 12/14]
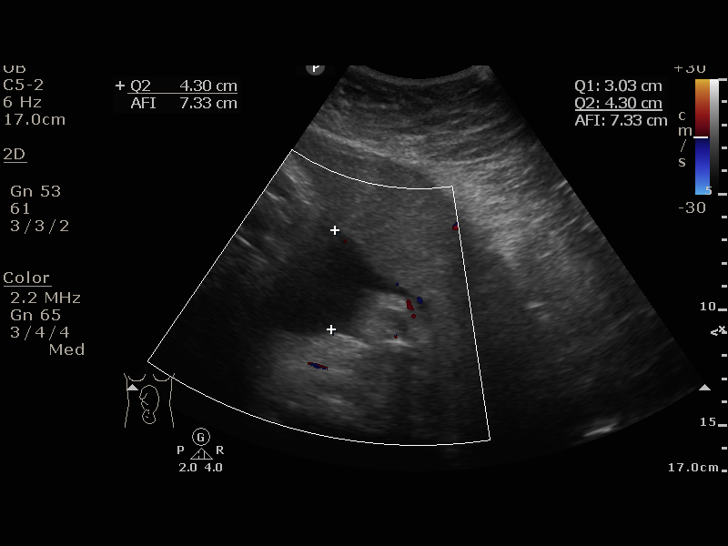
[im 13/14]
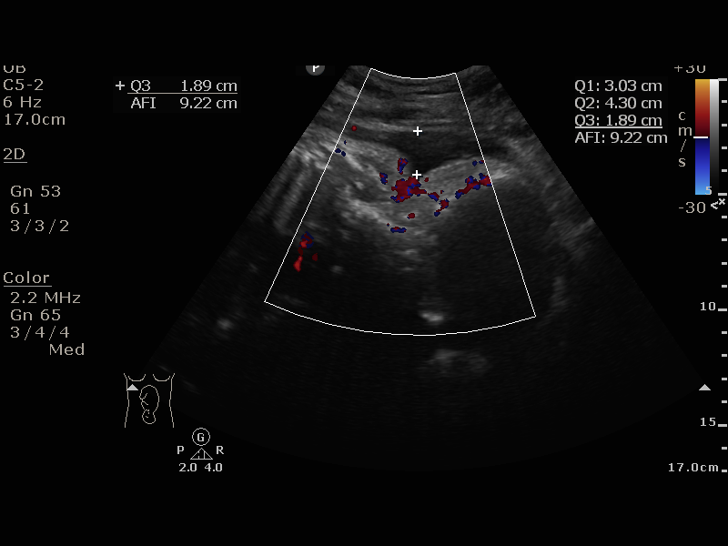
[im 14/14]
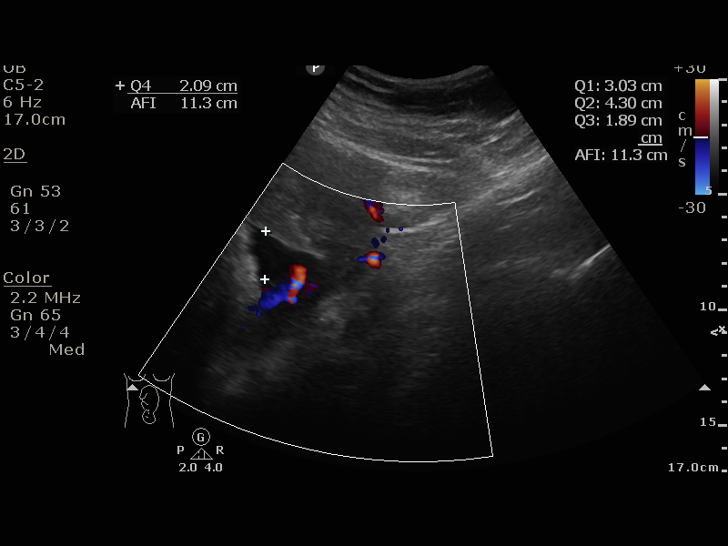

[13 of 14 positions shown; findings below may reference images not displayed]

OB/Gyn Clinic
                                                            Women's
                                                            [REDACTED]

  1  US FETAL BPP W/NONSTRESS             76818.4      TOLINO
 ----------------------------------------------------------------------

 ----------------------------------------------------------------------
Indications

  35 weeks gestation of pregnancy
  Unspecified pre-existing hypertension          [1O]
  complicating pregnancy, third trimester
  Gestational diabetes in pregnancy,             [1O]
  controlled by oral hypoglycemic drugs
 ----------------------------------------------------------------------
Vital Signs

                                                Height:        5'0"
Fetal Evaluation

 Num Of Fetuses:         1
 Preg. Location:         Intrauterine
 Cardiac Activity:       Observed
 Presentation:           Cephalic

 Amniotic Fluid
 AFI FV:      Within normal limits

 AFI Sum(cm)     %Tile       Largest Pocket(cm)
 11.31           31

 RUQ(cm)       RLQ(cm)       LUQ(cm)        LLQ(cm)

Biophysical Evaluation

 Amniotic F.V:   Pocket => 2 cm two         F. Tone:        Observed
                 planes
 F. Movement:    Observed                   N.S.T:          Reactive
 F. Breathing:   Observed                   Score:          [DATE]
OB History

 Gravidity:    2         Term:   1        Prem:   0        SAB:   0
 TOP:          0       Ectopic:  0        Living: 1
Gestational Age

 LMP:           30w 6d        Date:  [DATE]                 EDD:   [DATE]
 Best:          35w 5d     Det. By:  Early Ultrasound         EDD:   [DATE]
                                     ([DATE])
Impression

 Antenatal testing is reassuring with BPP [DATE]. Normal
 amniotic fluid volume.
Recommendations

 Continue weekly antenatal testing till delivery.
              TOLINO

## 2018-12-12 MED ORDER — INSULIN NPH (HUMAN) (ISOPHANE) 100 UNIT/ML ~~LOC~~ SUSP: SUBCUTANEOUS | 3 refills | Status: DC

## 2018-12-12 NOTE — Progress Notes (Signed)
Pt states she has been sweating frequently and her blood sugar is high at those times. She is following the appropriate diet. Pt forgot that she was advised to increase glyburide to 5 mg @ bedtime during her last visit so she has still only been taking 2.5 mg @ bedtime daily.

## 2018-12-12 NOTE — Progress Notes (Signed)
I have reviewed this chart and agree with the RN/CMA assessment and management.    Garlon Tuggle C Rachit Grim, MD, FACOG Attending Physician, [facility]  

## 2018-12-12 NOTE — Progress Notes (Addendum)
Insulin Instruction  EDD 01/04/2019 with scheduled C-section  Patient was seen on 12/12/2018 for insulin instruction for 10 units NPH at bedtime  The following learning objectives were met by the patient during this visit:   Insulin Action of NPH insulins  Reviewed syringe & vial including # units per syringe,    length of needles,  Hygiene and storage  Drawing up single and mixed doses if using vials   Single dose  Rotation of Sites  Hypoglycemia- symptoms, causes , treatment choices  Record keeping and MD follow up   Patient demonstrated understanding of insulin administration by return demonstration.  Patient received the following handouts:  Insulin Instruction Handout                                        Patient to start on insulin as Rx'd by MD  Patient will be seen for follow-up as needed.

## 2018-12-12 NOTE — Progress Notes (Signed)

## 2018-12-12 NOTE — Progress Notes (Signed)
   PRENATAL VISIT NOTE  Subjective:  Alexandra Gray is a 28 y.o. G2P1001 at [redacted]w[redacted]d being seen today for ongoing prenatal care.  She is currently monitored for the following issues for this high-risk pregnancy and has MDD (major depressive disorder), recurrent severe, without psychosis (HCC); Suicidal ideation; Supervision of high risk pregnancy, antepartum; History of cesarean delivery affecting pregnancy; History of pregnancy induced hypertension; Morbid obesity (HCC); Obesity in pregnancy; Elevated fasting blood sugar; Chronic hypertension during pregnancy, antepartum; Gestational diabetes; Cluster B personality disorder (HCC); PTSD (post-traumatic stress disorder); Sleep apnea; and Severe major depression without psychotic features (HCC) on their problem list.  Patient reports no complaints.  Contractions: Irregular. Vag. Bleeding: None.  Movement: Present. Denies leaking of fluid.   The following portions of the patient's history were reviewed and updated as appropriate: allergies, current medications, past family history, past medical history, past social history, past surgical history and problem list. Problem list updated.  Objective:   Vitals:   12/12/18 0918  BP: 134/83  Pulse: (!) 104  Weight: 294 lb 12.8 oz (133.7 kg)    Fetal Status: Fetal Heart Rate (bpm): NST   Movement: Present     General:  Alert, oriented and cooperative. Patient is in no acute distress.  Skin: Skin is warm and dry. No rash noted.   Cardiovascular: Normal heart rate noted  Respiratory: Normal respiratory effort, no problems with respiration noted  Abdomen: Soft, gravid, appropriate for gestational age.  Pain/Pressure: Present     Pelvic: Cervical exam deferred        Extremities: Normal range of motion.  Edema: Mild pitting, slight indentation  Mental Status: Normal mood and affect. Normal behavior. Normal judgment and thought content.   Assessment and Plan:  Pregnancy: G2P1001 at [redacted]w[redacted]d  1.  Supervision of high risk pregnancy, antepartum   2. Chronic hypertension during pregnancy, antepartum - on baby asa - MFM u/s q 4 weeks - weekly BPPs  3. Gestational diabetes mellitus (GDM) in third trimester controlled on oral hypoglycemic drug - She had symptoms on metformin so she was changed to glyburide qhs. Her fastings on 5 mg qhs are still in the 120s-130s. Her 2 hour PCs are great. So I will start NPH at night, 10 units  4. History of cesarean delivery affecting pregnancy - she declines a TOLAC - scheduled for 01-04-19  5. Obesity in pregnancy   Preterm labor symptoms and general obstetric precautions including but not limited to vaginal bleeding, contractions, leaking of fluid and fetal movement were reviewed in detail with the patient. Please refer to After Visit Summary for other counseling recommendations.  Return in about 1 week (around 12/19/2018) for [facility] and NST/BPP, Need urine; 1/30 HOB and NST/BPP.  Future Appointments  Date Time Provider Department Center  12/13/2018 11:20 AM Revankar, Aundra Dubin, MD [facility] None    Allie Bossier, MD

## 2018-12-13 ENCOUNTER — Ambulatory Visit: Payer: Self-pay | Admitting: Cardiology

## 2018-12-17 ENCOUNTER — Telehealth: Payer: Self-pay | Admitting: *Deleted

## 2018-12-17 DIAGNOSIS — O24415 Gestational diabetes mellitus in pregnancy, controlled by oral hypoglycemic drugs: Secondary | ICD-10-CM

## 2018-12-17 MED ORDER — INSULIN NPH (HUMAN) (ISOPHANE) 100 UNIT/ML ~~LOC~~ SUSP: SUBCUTANEOUS | 3 refills | Status: DC

## 2018-12-17 NOTE — Telephone Encounter (Signed)
Patient sent message this am that her blood sugars were running very high. I called her and she reports her fasting blood sugar was 150 yesterday and 138 this am . Also that her before lunch yesterday was 145, before supper 138, bedtime 120. She reports she started the nph insulin on 12/12/18 as ordered and is not taking metformin or glyburide. She reports she is doing everything Dr.Dove told her and following her diet. I informed her I would talk with doctor and call her back.  I called Dr.Dove and reported all of above to her. She ordered for patient to start taking NPH 6 units in am , continue taking bedtime NPH as ordered; to check fasting blood sugar, 2 hours postprandial and bedtime.  I called [facility] and reviewed Dr.Dove's new orders with her and explained she should bring all of her logs with her blood sugar to her ob visit next week. She voices understanding.

## 2018-12-19 ENCOUNTER — Ambulatory Visit (INDEPENDENT_AMBULATORY_CARE_PROVIDER_SITE_OTHER): Payer: Medicaid Other | Admitting: Obstetrics and Gynecology

## 2018-12-19 ENCOUNTER — Ambulatory Visit (INDEPENDENT_AMBULATORY_CARE_PROVIDER_SITE_OTHER): Payer: Medicaid Other | Admitting: *Deleted

## 2018-12-19 ENCOUNTER — Other Ambulatory Visit (HOSPITAL_COMMUNITY)
Admission: RE | Admit: 2018-12-19 | Discharge: 2018-12-19 | Disposition: A | Discharge status: Home / Self Care | Payer: Medicaid Other | Source: Ambulatory Visit | Attending: Obstetrics and Gynecology | Admitting: Obstetrics and Gynecology

## 2018-12-19 ENCOUNTER — Encounter: Payer: Self-pay | Admitting: Obstetrics and Gynecology

## 2018-12-19 ENCOUNTER — Ambulatory Visit: Payer: Self-pay

## 2018-12-19 VITALS — BP 126/91 | HR 96 | Wt 293.2 lb

## 2018-12-19 DIAGNOSIS — O99210 Obesity complicating pregnancy, unspecified trimester: Secondary | ICD-10-CM

## 2018-12-19 DIAGNOSIS — O10913 Unspecified pre-existing hypertension complicating pregnancy, third trimester: Secondary | ICD-10-CM

## 2018-12-19 DIAGNOSIS — Z6841 Body Mass Index (BMI) 40.0 and over, adult: Secondary | ICD-10-CM

## 2018-12-19 DIAGNOSIS — O24414 Gestational diabetes mellitus in pregnancy, insulin controlled: Secondary | ICD-10-CM

## 2018-12-19 DIAGNOSIS — O0990 Supervision of high risk pregnancy, unspecified, unspecified trimester: Secondary | ICD-10-CM | POA: Insufficient documentation

## 2018-12-19 DIAGNOSIS — O10919 Unspecified pre-existing hypertension complicating pregnancy, unspecified trimester: Secondary | ICD-10-CM

## 2018-12-19 DIAGNOSIS — O24415 Gestational diabetes mellitus in pregnancy, controlled by oral hypoglycemic drugs: Secondary | ICD-10-CM

## 2018-12-19 DIAGNOSIS — O99213 Obesity complicating pregnancy, third trimester: Secondary | ICD-10-CM

## 2018-12-19 DIAGNOSIS — O0993 Supervision of high risk pregnancy, unspecified, third trimester: Secondary | ICD-10-CM

## 2018-12-19 DIAGNOSIS — O34219 Maternal care for unspecified type scar from previous cesarean delivery: Secondary | ICD-10-CM

## 2018-12-19 DIAGNOSIS — O24419 Gestational diabetes mellitus in pregnancy, unspecified control: Secondary | ICD-10-CM

## 2018-12-19 IMAGING — US US FETAL BPP W/ NON-STRESS
1 series · 13 of 15 positions shown · non-contrast
Comparison: none

[Series 1: us fetal bpp w/nonstress · 15 acquisitions, 13 frames shown]
[im 1/15]
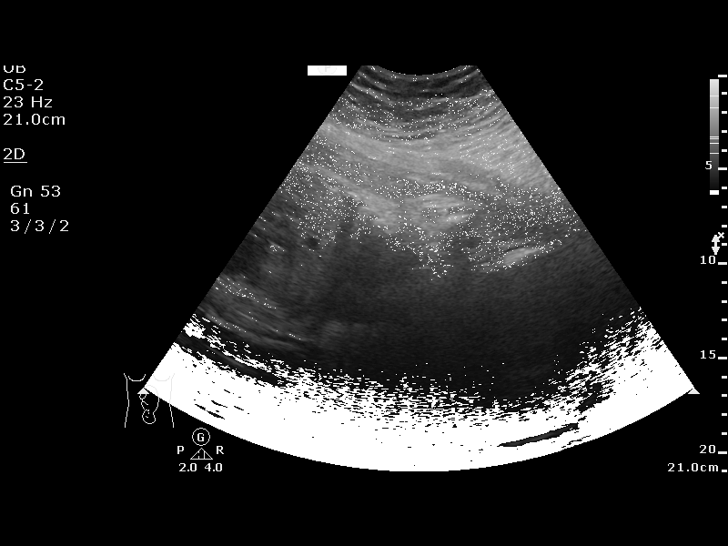
[im 2/15]
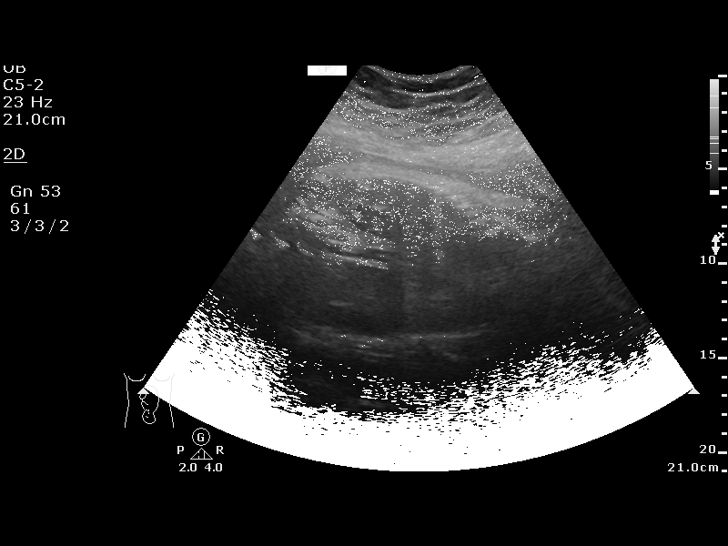
[im 3/15]
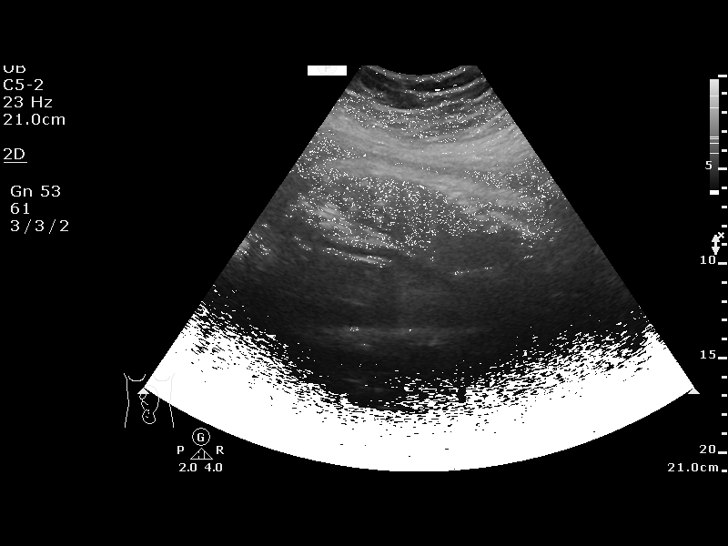
[im 5/15]
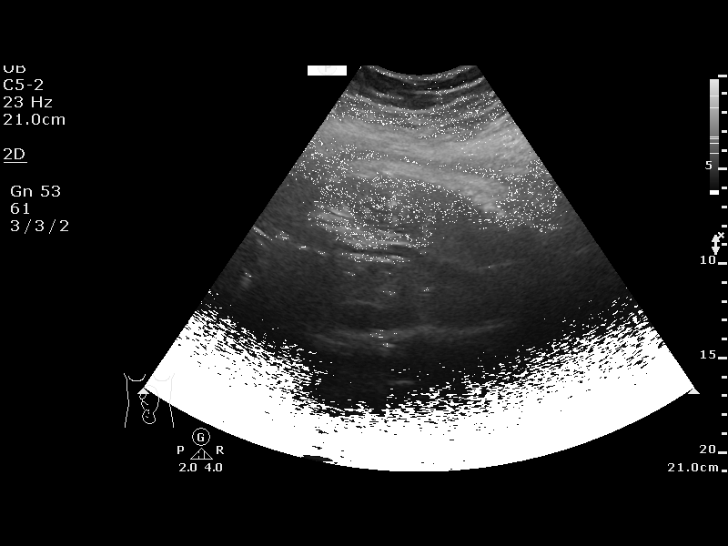
[im 6/15]
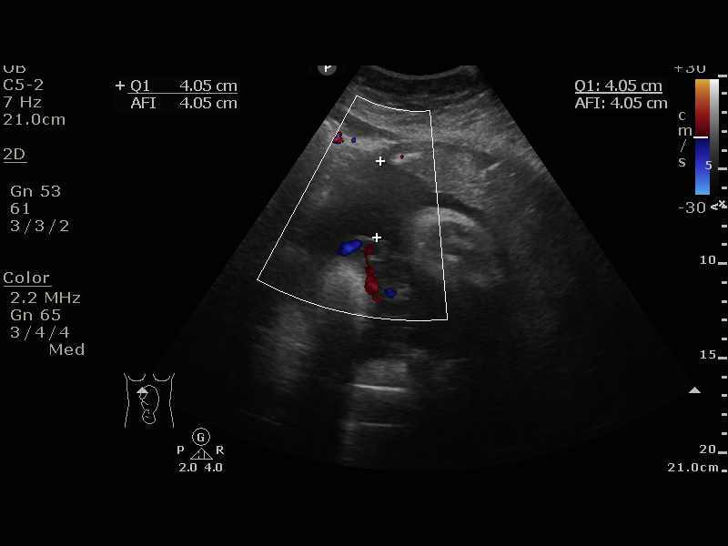
[im 7/15]
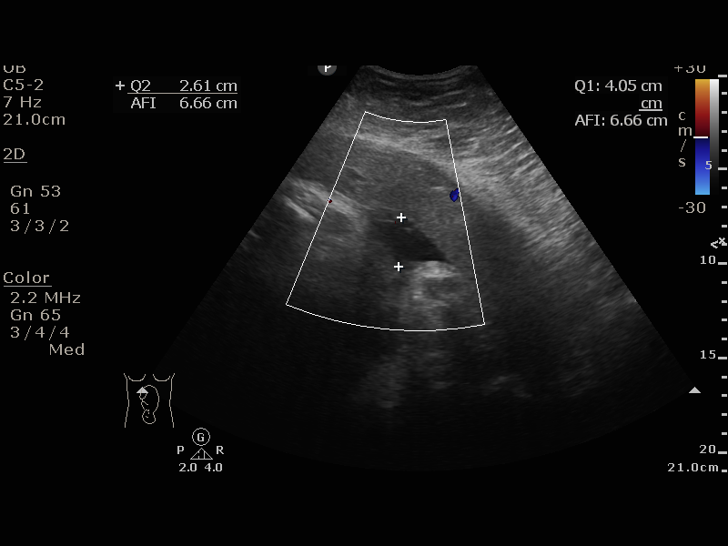
[im 8/15]
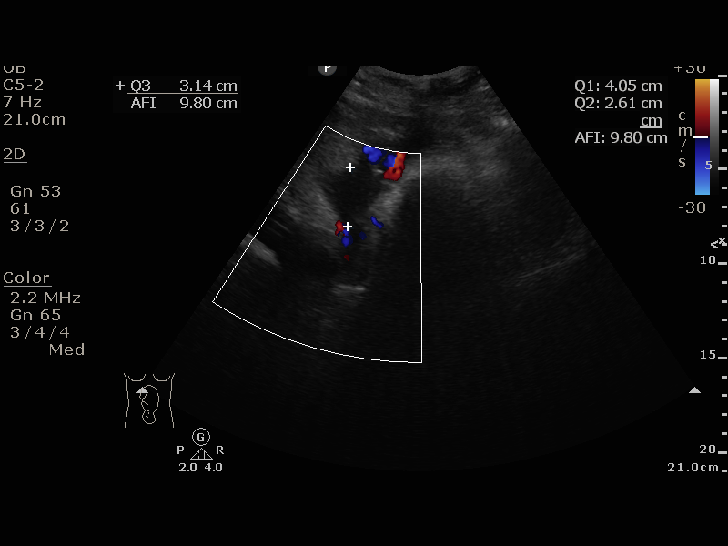
[im 9/15]
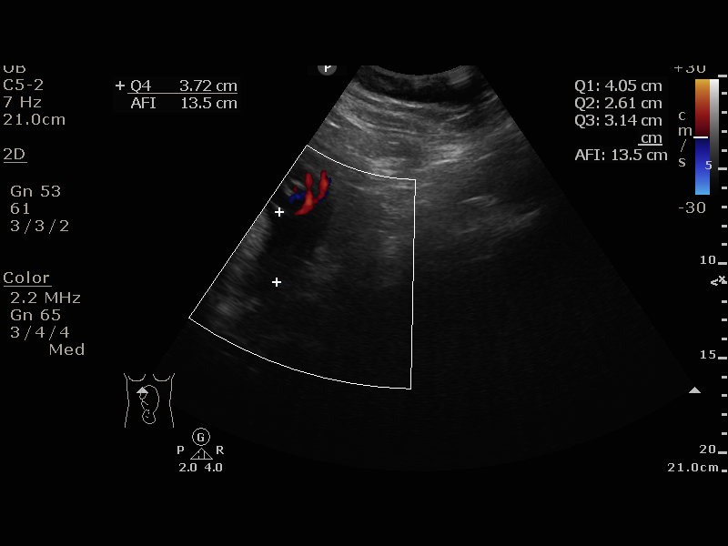
[im 10/15]
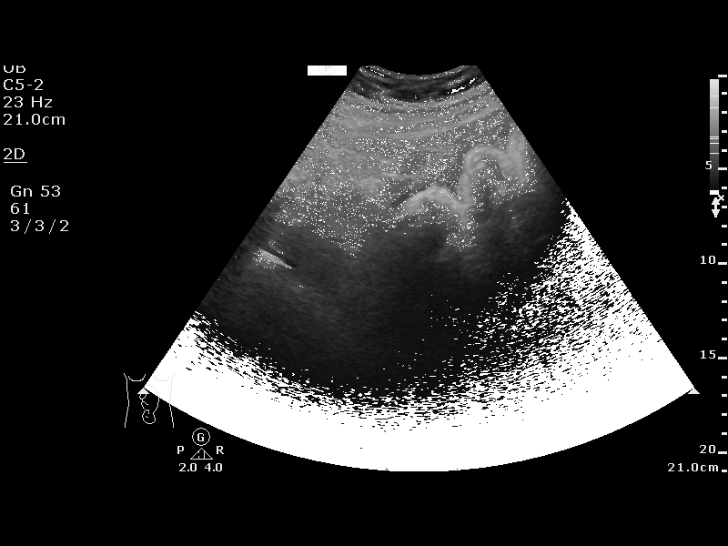
[im 11/15]
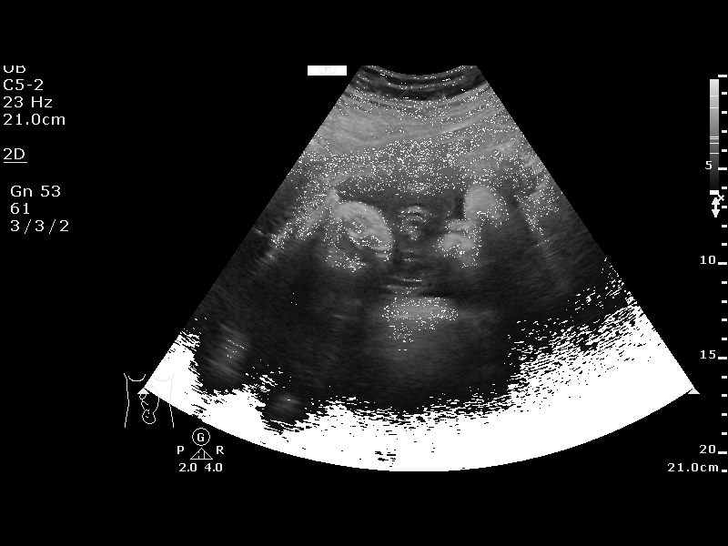
[im 13/15]
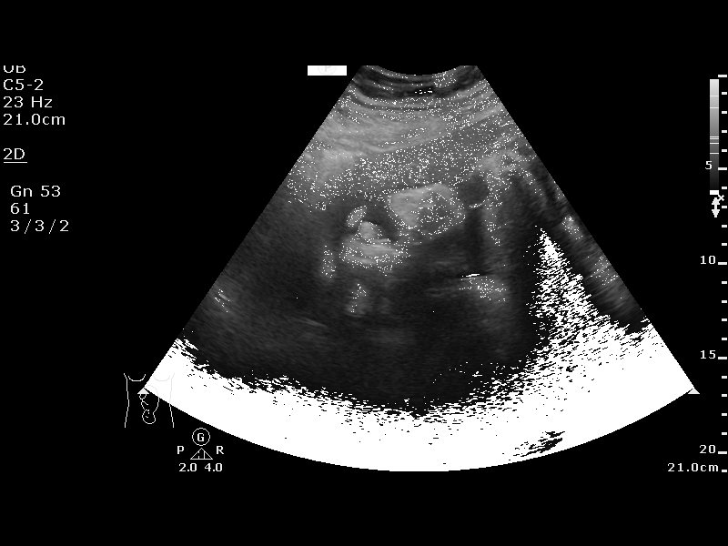
[im 14/15]
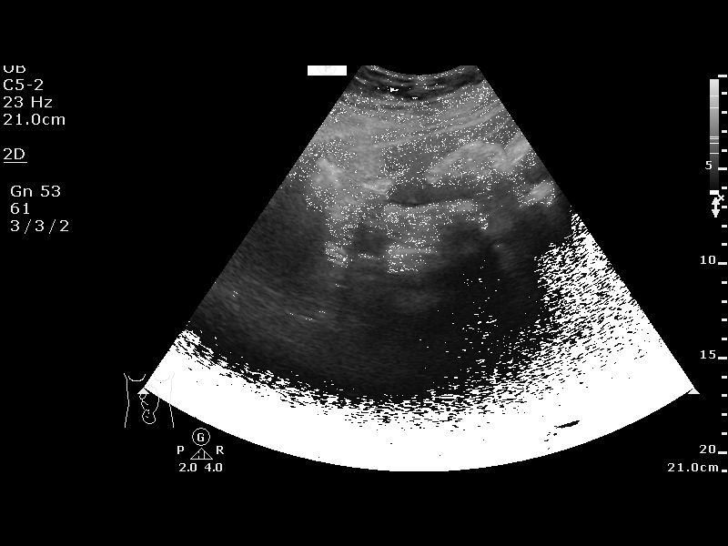
[im 15/15]
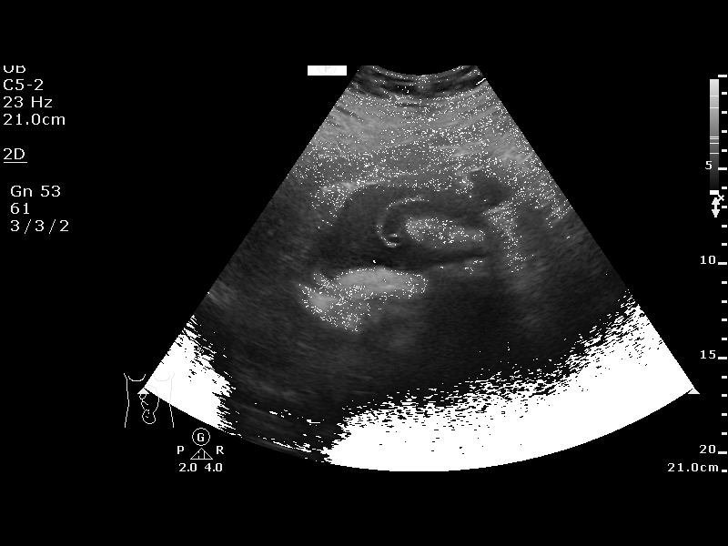

[13 of 15 positions shown; findings below may reference images not displayed]

OB/Gyn Clinic
                                                            Women's
                                                            [REDACTED]

  1  US FETAL BPP W/NONSTRESS             76818.4      YOSBEL
 ----------------------------------------------------------------------

 ----------------------------------------------------------------------
Service(s) Provided

 ----------------------------------------------------------------------
Indications

  36 weeks gestation of pregnancy
  Gestational diabetes in pregnancy, insulin     [GZ]
  controlled
  Unspecified pre-existing hypertension          [GZ]
  complicating pregnancy, third trimester
 ----------------------------------------------------------------------
Vital Signs

                                                Height:        5'0"
Fetal Evaluation

 Num Of Fetuses:         1
 Preg. Location:         Intrauterine
 Cardiac Activity:       Observed
 Presentation:           Cephalic

 Amniotic Fluid
 AFI FV:      Within normal limits

 AFI Sum(cm)     %Tile       Largest Pocket(cm)
 13.52           50
 RUQ(cm)       RLQ(cm)       LUQ(cm)        LLQ(cm)

Biophysical Evaluation

 Amniotic F.V:   Pocket => 2 cm two         F. Tone:        Observed
                 planes
 F. Movement:    Observed                   N.S.T:          Reactive
 F. Breathing:   Observed                   Score:          [DATE]
OB History

 Gravidity:    2         Term:   1        Prem:   0        SAB:   0
 TOP:          0       Ectopic:  0        Living: 1
Gestational Age

 LMP:           31w 6d        Date:  [DATE]                 EDD:   [DATE]
 Best:          36w 5d     Det. By:  Early Ultrasound         EDD:   [DATE]
                                     ([DATE])
Comments

 Technically limited exam due to maternal body habitus.
Impression

 Reassuring antenatal testing
Recommendations

 Continue with weekly testing
               YOSBEL

## 2018-12-19 MED ORDER — INSULIN NPH (HUMAN) (ISOPHANE) 100 UNIT/ML ~~LOC~~ SUSP: SUBCUTANEOUS | 3 refills | Status: DC

## 2018-12-19 NOTE — Progress Notes (Signed)
Prenatal Visit Note Date: 12/19/2018 Clinic: [facility]  Subjective:  Alexandra Gray is a 28 y.o. G2P1001 at [redacted]w[redacted]d being seen today for ongoing prenatal care.  She is currently monitored for the following issues for this high-risk pregnancy and has MDD (major depressive disorder), recurrent severe, without psychosis (HCC); Suicidal ideation; Supervision of high risk pregnancy, antepartum; History of cesarean delivery affecting pregnancy; BMI 50.0-59.9, adult (HCC); Obesity in pregnancy; Chronic hypertension during pregnancy, antepartum; GDM, class A2; Cluster B personality disorder (HCC); PTSD (post-traumatic stress disorder); Sleep apnea; and Severe major depression without psychotic features (HCC) on their problem list.  Patient reports no complaints.   Contractions: Irregular. Vag. Bleeding: None.  Movement: (!) Decreased. Denies leaking of fluid.   The following portions of the patient's history were reviewed and updated as appropriate: allergies, current medications, past family history, past medical history, past social history, past surgical history and problem list. Problem list updated.  Objective:   Vitals:   12/19/18 1516  BP: (!) 126/91  Pulse: 96  Weight: 293 lb 3.2 oz (133 kg)    Fetal Status: Fetal Heart Rate (bpm): NST   Movement: (!) Decreased     General:  Alert, oriented and cooperative. Patient is in no acute distress.  Skin: Skin is warm and dry. No rash noted.   Cardiovascular: Normal heart rate noted  Respiratory: Normal respiratory effort, no problems with respiration noted  Abdomen: Soft, gravid, appropriate for gestational age. Pain/Pressure: Present     Pelvic:  Cervical exam deferred        Extremities: Normal range of motion.     Mental Status: Normal mood and affect. Normal behavior. Normal judgment and thought content.   Urinalysis:      Assessment and Plan:  Pregnancy: G2P1001 at [redacted]w[redacted]d  1. Supervision of high risk  pregnancy, antepartum Routine care. No BTL. Encouragement given to patien - GC/Chlamydia probe amp ([place])not at [facility] - Strep Gp B NAA  2. Chronic hypertension during pregnancy, antepartum Doing well on no meds  3. History of cesarean delivery affecting pregnancy Scheduled for rpt already  4. Insulin controlled gestational diabetes mellitus (GDM) in third trimester On NPH 6 with breakfast and 10 qhs and no meal coverage. AM fastings in the 130s-150s and 2hr PP in the 140s. Will adjust am dose to 12 in the morning and 16 qhs. Will hold off on meal coverage since 2h PP aren't too bad. bpp 10/10 today and recent growth u/s early this month. Continue with weekly testing  5. GDM, class A2  6. BMI 50.0-59.9, adult (HCC)   7. Obesity in pregnancy  Preterm labor symptoms and general obstetric precautions including but not limited to vaginal bleeding, contractions, leaking of fluid and fetal movement were reviewed in detail with the patient. Please refer to After Visit Summary for other counseling recommendations.  Return in about 1 week (around 12/26/2018) for as scheduled.   Howardwick Bing, MD

## 2018-12-19 NOTE — Progress Notes (Signed)

## 2018-12-19 NOTE — Progress Notes (Signed)
Pt states she has been taking NPH insulin as prescribed and her blood sugars are still high (130-160). She reports decreased FM x2-3 days and none today. Pt was not able to feel FM during BPP however did feel good FM during NST. Per chart review, pt did not keep Cardiology appointment w/Dr. Tomie China in [facility] on 1/17. Pt states she does not have transportation to [place] and states her mother is going to schedule appointment with Cardiologist in [place].

## 2018-12-21 LAB — STREP GP B NAA: Strep Gp B NAA: POSITIVE — AB

## 2018-12-23 ENCOUNTER — Telehealth (HOSPITAL_COMMUNITY): Payer: Self-pay | Admitting: *Deleted

## 2018-12-23 ENCOUNTER — Encounter: Payer: Self-pay | Admitting: Obstetrics and Gynecology

## 2018-12-23 DIAGNOSIS — O99820 Streptococcus B carrier state complicating pregnancy: Secondary | ICD-10-CM | POA: Insufficient documentation

## 2018-12-23 LAB — GC/CHLAMYDIA PROBE AMP (~~LOC~~) NOT AT ARMC
Chlamydia: NEGATIVE
NEISSERIA GONORRHEA: NEGATIVE

## 2018-12-23 NOTE — Telephone Encounter (Signed)
Preadmission screen  

## 2018-12-24 ENCOUNTER — Encounter (HOSPITAL_COMMUNITY): Payer: Self-pay | Admitting: *Deleted

## 2018-12-26 ENCOUNTER — Ambulatory Visit (INDEPENDENT_AMBULATORY_CARE_PROVIDER_SITE_OTHER): Payer: Medicaid Other | Admitting: *Deleted

## 2018-12-26 ENCOUNTER — Ambulatory Visit: Payer: Self-pay

## 2018-12-26 ENCOUNTER — Ambulatory Visit (INDEPENDENT_AMBULATORY_CARE_PROVIDER_SITE_OTHER): Payer: Medicaid Other | Admitting: Advanced Practice Midwife

## 2018-12-26 VITALS — BP 128/74 | HR 99 | Wt 294.9 lb

## 2018-12-26 DIAGNOSIS — O0990 Supervision of high risk pregnancy, unspecified, unspecified trimester: Secondary | ICD-10-CM

## 2018-12-26 DIAGNOSIS — O10913 Unspecified pre-existing hypertension complicating pregnancy, third trimester: Secondary | ICD-10-CM

## 2018-12-26 DIAGNOSIS — O10919 Unspecified pre-existing hypertension complicating pregnancy, unspecified trimester: Secondary | ICD-10-CM

## 2018-12-26 DIAGNOSIS — O99820 Streptococcus B carrier state complicating pregnancy: Secondary | ICD-10-CM

## 2018-12-26 DIAGNOSIS — O24414 Gestational diabetes mellitus in pregnancy, insulin controlled: Secondary | ICD-10-CM

## 2018-12-26 DIAGNOSIS — Z3A38 38 weeks gestation of pregnancy: Secondary | ICD-10-CM

## 2018-12-26 DIAGNOSIS — O34219 Maternal care for unspecified type scar from previous cesarean delivery: Secondary | ICD-10-CM

## 2018-12-26 DIAGNOSIS — O24419 Gestational diabetes mellitus in pregnancy, unspecified control: Secondary | ICD-10-CM

## 2018-12-26 DIAGNOSIS — O0993 Supervision of high risk pregnancy, unspecified, third trimester: Secondary | ICD-10-CM

## 2018-12-26 LAB — POCT URINALYSIS DIP (DEVICE)
Glucose, UA: NEGATIVE mg/dL
Hgb urine dipstick: NEGATIVE
Ketones, ur: NEGATIVE mg/dL
Nitrite: NEGATIVE
Protein, ur: 30 mg/dL — AB
Specific Gravity, Urine: >=1.03 (ref 1.005–1.030)
Urobilinogen, UA: 1 mg/dL (ref 0.0–1.0)
pH: 6 (ref 5.0–8.0)

## 2018-12-26 IMAGING — US US FETAL BPP W/ NON-STRESS
1 series · 13 of 15 positions shown · non-contrast
Comparison: none

[Series 1: us fetal bpp w/nonstress · 15 acquisitions, 13 frames shown]
[im 1/15]
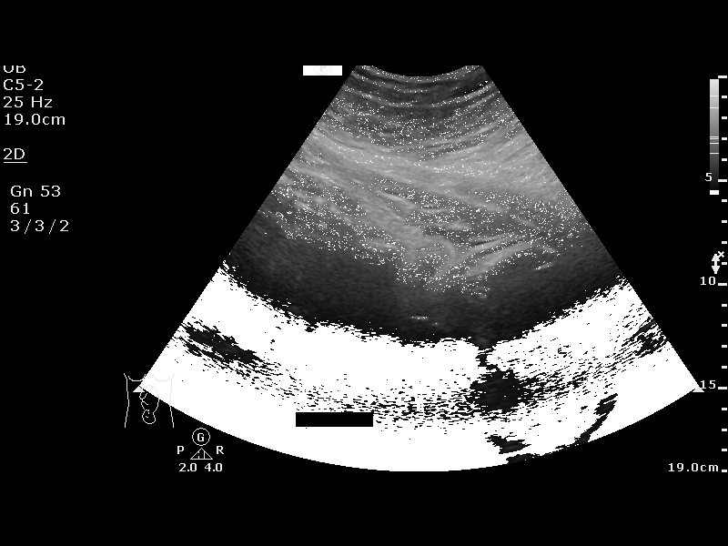
[im 2/15]
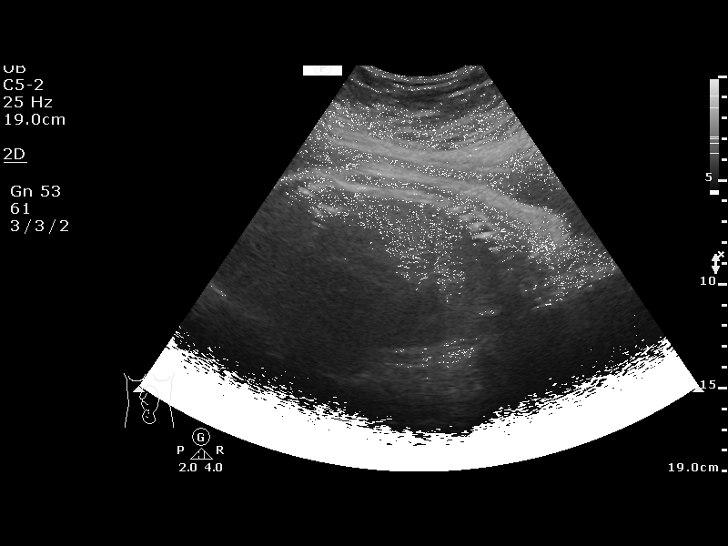
[im 3/15]
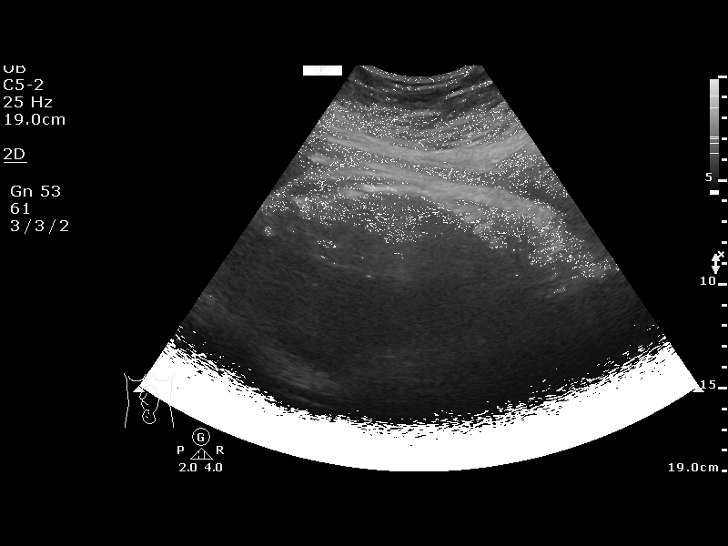
[im 5/15]
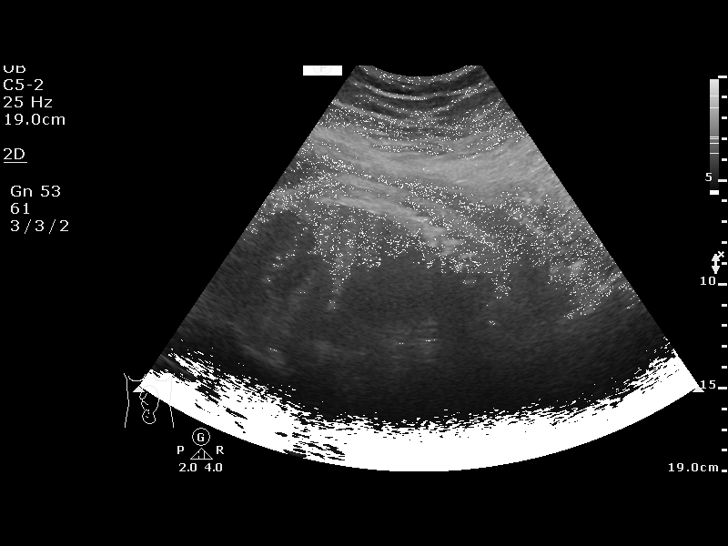
[im 6/15]
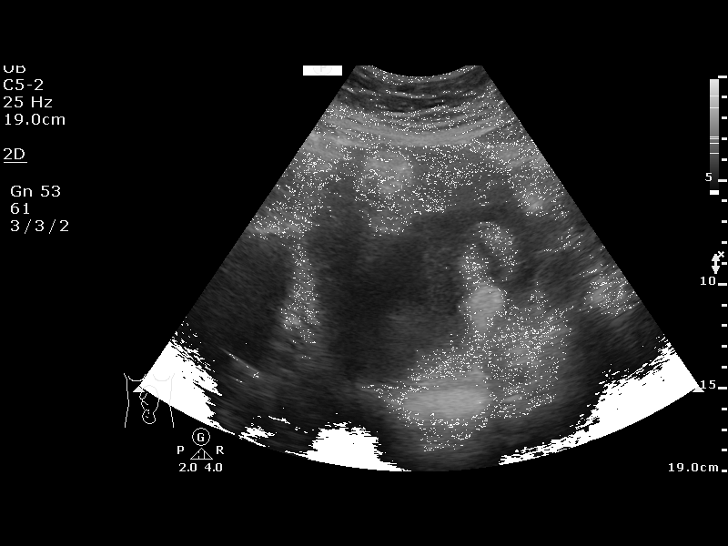
[im 7/15]
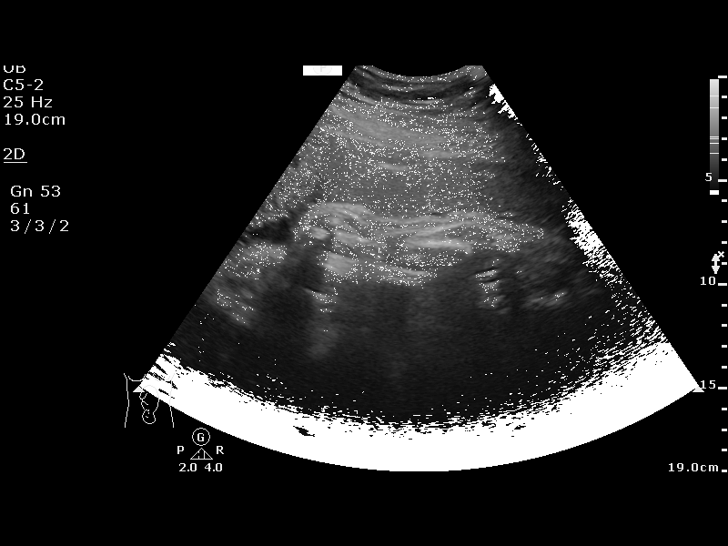
[im 8/15]
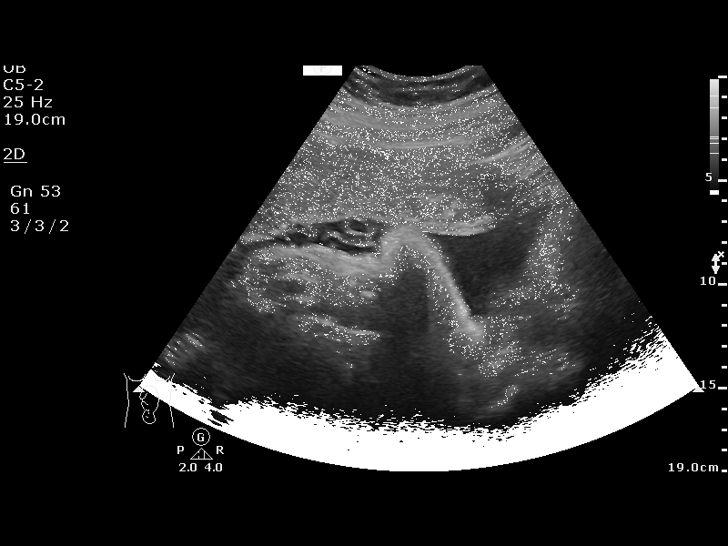
[im 9/15]
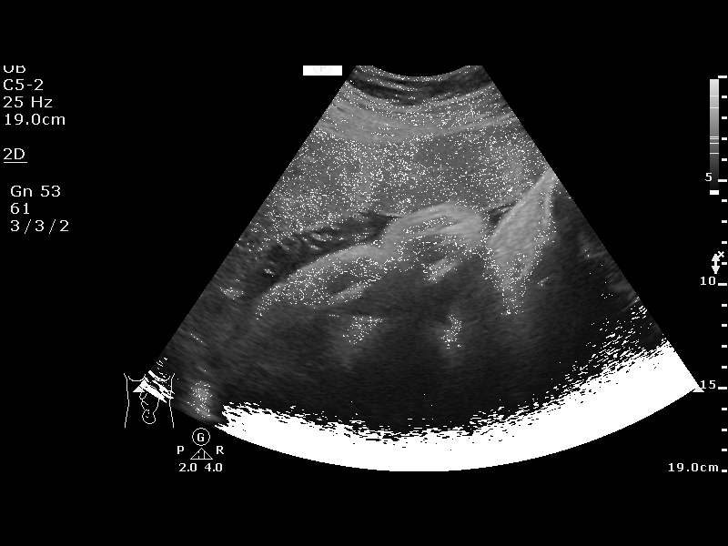
[im 10/15]
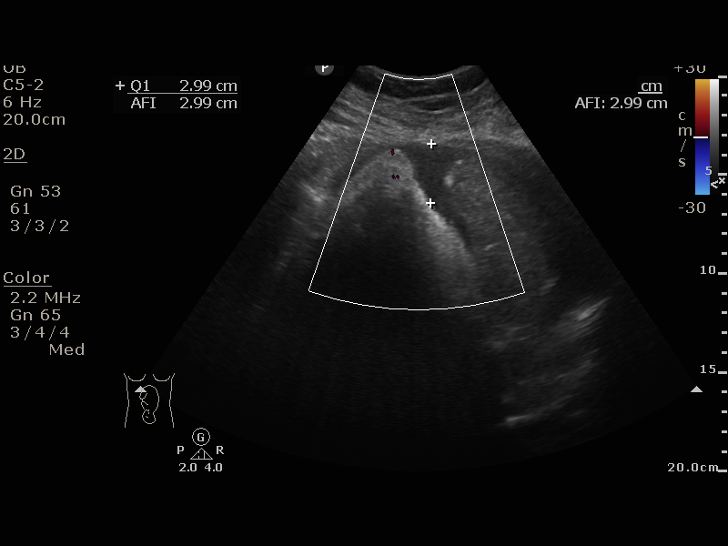
[im 11/15]
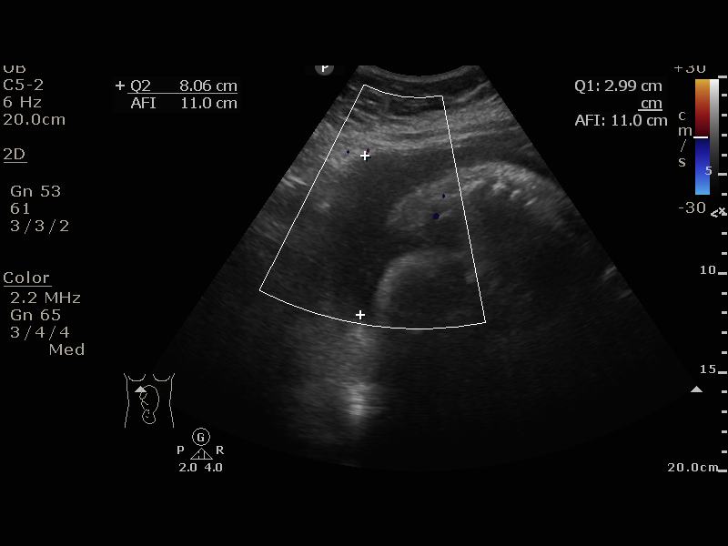
[im 13/15]
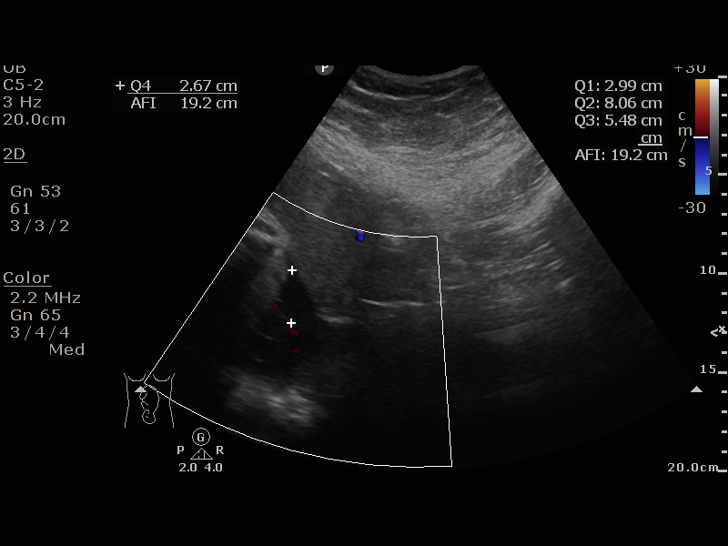
[im 14/15]
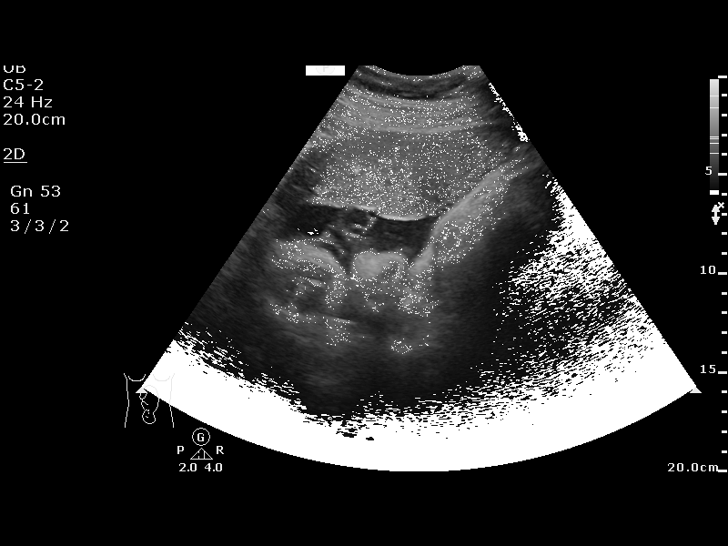
[im 15/15]
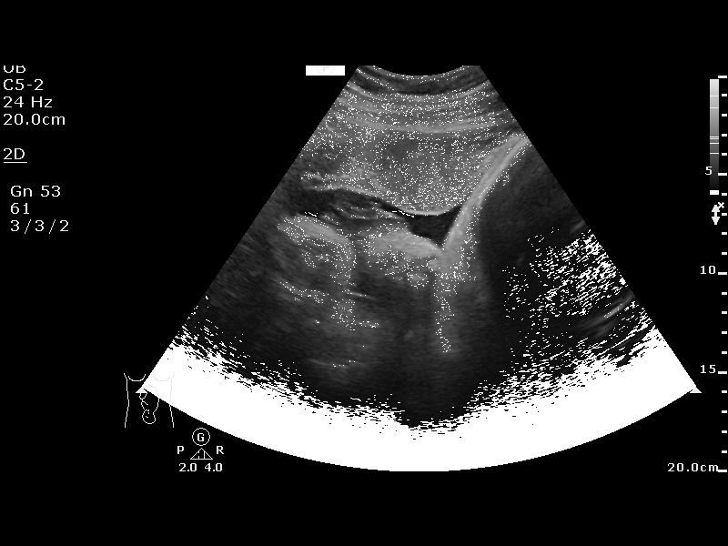

[13 of 15 positions shown; findings below may reference images not displayed]

OB/Gyn Clinic
 Attending:        SCHWARZES      [facility]

                                                       [facility]ES
 ----------------------------------------------------------------------

 ----------------------------------------------------------------------
Indications

  37 weeks gestation of pregnancy
  Unspecified pre-existing hypertension          [4M]
  complicating pregnancy, unspecified
  trimester
  Gestational diabetes in pregnancy, insulin     [4M]
  controlled
 ----------------------------------------------------------------------
Vital Signs

                                                Height:        5'0"
Fetal Evaluation

 Num Of Fetuses:         1
 Preg. Location:         Intrauterine
 Cardiac Activity:       Observed
 Presentation:           Cephalic

 Amniotic Fluid
 AFI FV:      Within normal limits

 AFI Sum(cm)     %Tile       Largest Pocket(cm)
 19.2            75

 RUQ(cm)       RLQ(cm)       LUQ(cm)        LLQ(cm)

Biophysical Evaluation

 Amniotic F.V:   Pocket => 2 cm two         F. Tone:        Observed
                 planes
 F. Movement:    Observed                   N.S.T:          Reactive
 F. Breathing:   Observed                   Score:          [DATE]
OB History

 Gravidity:    2         Term:   1        Prem:   0        SAB:   0
 TOP:          0       Ectopic:  0        Living: 1
Gestational Age

 LMP:           32w 6d        Date:  [DATE]                 EDD:   [DATE]
 Best:          37w 5d     Det. By:  Early Ultrasound         EDD:   [DATE]
                                     ([DATE])
Comments

 Technically limited exam due to maternal body habitus.
Impression

 Antenatal testing is reassuring with BPP [DATE].  Normal
 amniotic fluid volume.
Recommendations

 Continue weekly antenatal testing till delivery.
              SCHWARZES

## 2018-12-26 NOTE — Patient Instructions (Signed)
.  Preeclampsia and Eclampsia    Preeclampsia is a serious condition that may develop during pregnancy. It is also called toxemia of pregnancy. This condition causes high blood pressure along with other symptoms, such as swelling and headaches. These symptoms may develop as the condition gets worse. Preeclampsia may occur at 20 weeks of pregnancy or later.  Diagnosing and treating preeclampsia early is very important. If not treated early, it can cause serious problems for you and your baby. One problem it can lead to is eclampsia. Eclampsia is a condition that causes muscle jerking or shaking (convulsions or seizures) and other serious problems for the mother. During pregnancy, delivering your baby may be the best treatment for preeclampsia or eclampsia. For most women, preeclampsia and eclampsia symptoms go away after giving birth.  In rare cases, a woman may develop preeclampsia after giving birth (postpartum preeclampsia). This usually occurs within 48 hours after childbirth but may occur up to 6 weeks after giving birth.  What are the causes?  The cause of preeclampsia is not known.  What increases the risk?  The following risk factors make you more likely to develop preeclampsia:   Being pregnant for the first time.   Having had preeclampsia during a past pregnancy.   Having a family history of preeclampsia.   Having high blood pressure.   Being pregnant with more than one baby.   Being 35 or older.   Being African-American.   Having kidney disease or diabetes.   Having medical conditions such as lupus or blood diseases.   Being very overweight (obese).  What are the signs or symptoms?  The earliest signs of preeclampsia are:   High blood pressure.   Increased protein in your urine. Your health care provider will check for this at every visit before you give birth (prenatal visit).  Other symptoms that may develop as the condition gets worse include:   Severe headaches.   Sudden weight  gain.   Swelling of the hands, face, legs, and feet.   Nausea and vomiting.   Vision problems, such as blurred or double vision.   Numbness in the face, arms, legs, and feet.   Urinating less than usual.   Dizziness.   Slurred speech.   Abdominal pain, especially upper abdominal pain.   Convulsions or seizures.  How is this diagnosed?  There are no screening tests for preeclampsia. Your health care provider will ask you about symptoms and check for signs of preeclampsia during your prenatal visits. You may also have tests that include:   Urine tests.   Blood tests.   Checking your blood pressure.   Monitoring your baby's heart rate.   Ultrasound.  How is this treated?  You and your health care provider will determine the treatment approach that is best for you. Treatment may include:   Having more frequent prenatal exams to check for signs of preeclampsia, if you have an increased risk for preeclampsia.   Medicine to lower your blood pressure.   Staying in the hospital, if your condition is severe. There, treatment will focus on controlling your blood pressure and the amount of fluids in your body (fluid retention).   Taking medicine (magnesium sulfate) to prevent seizures. This may be given as an injection or through an IV.   Taking a low-dose aspirin during your pregnancy.   Delivering your baby early, if your condition gets worse. You may have your labor started with medicine (induced), or you may have a cesarean   delivery.  Follow these instructions at home:  Eating and drinking     Drink enough fluid to keep your urine pale yellow.   Avoid caffeine.  Lifestyle   Do not use any products that contain nicotine or tobacco, such as cigarettes and e-cigarettes. If you need help quitting, ask your health care provider.   Do not use alcohol or drugs.   Avoid stress as much as possible. Rest and get plenty of sleep.  General instructions   Take over-the-counter and prescription medicines only as  told by your health care provider.   When lying down, lie on your left side. This keeps pressure off your major blood vessels.   When sitting or lying down, raise (elevate) your feet. Try putting some pillows underneath your lower legs.   Exercise regularly. Ask your health care provider what kinds of exercise are best for you.   Keep all follow-up and prenatal visits as told by your health care provider. This is important.  How is this prevented?  There is no known way of preventing preeclampsia or eclampsia from developing. However, to lower your risk of complications and detect problems early:   Get regular prenatal care. Your health care provider may be able to diagnose and treat the condition early.   Maintain a healthy weight. Ask your health care provider for help managing weight gain during pregnancy.   Work with your health care provider to manage any long-term (chronic) health conditions you have, such as diabetes or kidney problems.   You may have tests of your blood pressure and kidney function after giving birth.   Your health care provider may have you take low-dose aspirin during your next pregnancy.  Contact a health care provider if:   You have symptoms that your health care provider told you may require more treatment or monitoring, such as:  ? Headaches.  ? Nausea or vomiting.  ? Abdominal pain.  ? Dizziness.  ? Light-headedness.  Get help right away if:   You have severe:  ? Abdominal pain.  ? Headaches that do not get better.  ? Dizziness.  ? Vision problems.  ? Confusion.  ? Nausea or vomiting.   You have any of the following:  ? A seizure.  ? Sudden, rapid weight gain.  ? Sudden swelling in your hands, ankles, or face.  ? Trouble moving any part of your body.  ? Numbness in any part of your body.  ? Trouble speaking.  ? Abnormal bleeding.   You faint.  Summary   Preeclampsia is a serious condition that may develop during pregnancy. It is also called toxemia of pregnancy.   This  condition causes high blood pressure along with other symptoms, such as swelling and headaches.   Diagnosing and treating preeclampsia early is very important. If not treated early, it can cause serious problems for you and your baby.   Get help right away if you have symptoms that your health care provider told you to watch for.  This information is not intended to replace advice given to you by your health care provider. Make sure you discuss any questions you have with your health care provider.  Document Released: 11/10/2000 Document Revised: 10/30/2017 Document Reviewed: 06/19/2016  Elsevier Interactive Patient Education  2019 [employer]

## 2018-12-26 NOTE — Progress Notes (Addendum)
Pt informed that the ultrasound is considered a limited OB ultrasound and is not intended to be a complete ultrasound exam.  Patient also informed that the ultrasound is not being completed with the intent of assessing for fetal or placental anomalies or any pelvic abnormalities.  Explained that the purpose of today's ultrasound is to assess for presentation, BPP and amniotic fluid volume.  Patient acknowledges the purpose of the exam and the limitations of the study.    Attestation of Attending Supervision of RN: Evaluation and management procedures were performed by the nurse under my supervision and collaboration.  I have reviewed the nursing note and chart, and I agree with the management and plan.  [name] L. Harraway-Smith, M.D., FACOG   

## 2018-12-26 NOTE — Progress Notes (Signed)
   PRENATAL VISIT NOTE  Subjective:  Alexandra Gray is a 28 y.o. G2P1001 at [redacted]w[redacted]d being seen today for ongoing prenatal care.  She is currently monitored for the following issues for this high-risk pregnancy and has MDD (major depressive disorder), recurrent severe, without psychosis (HCC); Suicidal ideation; Supervision of high risk pregnancy, antepartum; History of cesarean delivery affecting pregnancy; BMI 50.0-59.9, adult (HCC); Obesity in pregnancy; Chronic hypertension during pregnancy, antepartum; GDM, class A2; Cluster B personality disorder (HCC); PTSD (post-traumatic stress disorder); Sleep apnea; Severe major depression without psychotic features (HCC); and GBS (group B Streptococcus carrier), +RV culture, currently pregnant on their problem list.  Patient reports pelvic pressure. Denies LOF, VB, urinary complaints.  Contractions: Irregular. Vag. Bleeding: None.  Movement: Present. Denies leaking of fluid.   The following portions of the patient's history were reviewed and updated as appropriate: allergies, current medications, past family history, past medical history, past social history, past surgical history and problem list. Problem list updated.  Objective:   Vitals:   12/26/18 0837  BP: 128/74  Pulse: 99  Weight: 294 lb 14.4 oz (133.8 kg)    Fetal Status: Fetal Heart Rate (bpm): NST Fundal Height: 43 cm Movement: Present     General:  Alert, oriented and cooperative. Patient is in no acute distress.  Skin: Skin is warm and dry. No rash noted.   Cardiovascular: Normal heart rate noted  Respiratory: Normal respiratory effort, no problems with respiration noted  Abdomen: Soft, gravid, appropriate for gestational age.  Pain/Pressure: Present     Pelvic: Cervical exam performed Dilation: Closed Effacement (%): 0 Station: Ballotable  Extremities: Normal range of motion.     Mental Status: Normal mood and affect. Normal behavior. Normal judgment and thought content.   >  50% CBGs in range  Assessment and Plan:  Pregnancy: G2P1001 at [redacted]w[redacted]d  1. GDM, class A2 - Well-controlled on current insulin   2. Chronic hypertension during pregnancy, antepartum - BP well-controlled on no meds  3. Supervision of high risk pregnancy, antepartum   4. History of cesarean delivery affecting pregnancy - Repeat scheduled 01/04/19  5. GBS (group B Streptococcus carrier), +RV culture, currently pregnant - PCN for SROM/labor  Term labor symptoms and general obstetric precautions including but not limited to vaginal bleeding, contractions, leaking of fluid and fetal movement were reviewed in detail with the patient. Please refer to After Visit Summary for other counseling recommendations.  Return in about 1 week (around 01/02/2019) for 2/5 or 2/6 for NST/BPP and HOB.  Future Appointments  Date Time Provider Department Center  12/30/2018 11:00 AM [facility] [place] 3 [facility]  01/03/2019  8:30 AM [facility] PAT 5 [facility] None    Dorathy Kinsman, [place]

## 2018-12-30 ENCOUNTER — Ambulatory Visit (HOSPITAL_COMMUNITY): Payer: Self-pay

## 2018-12-31 ENCOUNTER — Encounter (HOSPITAL_COMMUNITY): Payer: Self-pay

## 2018-12-31 ENCOUNTER — Ambulatory Visit (HOSPITAL_COMMUNITY)
Admission: RE | Admit: 2018-12-31 | Discharge: 2018-12-31 | Disposition: A | Discharge status: Home / Self Care | Payer: Medicaid Other | Source: Ambulatory Visit | Attending: Advanced Practice Midwife | Admitting: Advanced Practice Midwife

## 2018-12-31 DIAGNOSIS — O10913 Unspecified pre-existing hypertension complicating pregnancy, third trimester: Secondary | ICD-10-CM | POA: Diagnosis not present

## 2018-12-31 DIAGNOSIS — Z3A38 38 weeks gestation of pregnancy: Secondary | ICD-10-CM | POA: Insufficient documentation

## 2018-12-31 DIAGNOSIS — O24414 Gestational diabetes mellitus in pregnancy, insulin controlled: Secondary | ICD-10-CM

## 2018-12-31 DIAGNOSIS — O99210 Obesity complicating pregnancy, unspecified trimester: Secondary | ICD-10-CM | POA: Diagnosis present

## 2018-12-31 DIAGNOSIS — O24419 Gestational diabetes mellitus in pregnancy, unspecified control: Secondary | ICD-10-CM | POA: Diagnosis present

## 2018-12-31 DIAGNOSIS — O34219 Maternal care for unspecified type scar from previous cesarean delivery: Secondary | ICD-10-CM

## 2018-12-31 DIAGNOSIS — O0990 Supervision of high risk pregnancy, unspecified, unspecified trimester: Secondary | ICD-10-CM | POA: Diagnosis present

## 2018-12-31 DIAGNOSIS — O10919 Unspecified pre-existing hypertension complicating pregnancy, unspecified trimester: Secondary | ICD-10-CM | POA: Diagnosis present

## 2018-12-31 DIAGNOSIS — O99820 Streptococcus B carrier state complicating pregnancy: Secondary | ICD-10-CM | POA: Diagnosis present

## 2018-12-31 IMAGING — US US MFM OB FOLLOW-UP
1 series · 14 of 24 positions shown · non-contrast
Comparison: none

[Series 1: us mfm ob follow-up · 14 of 24 slices shown]
[im 1/24]
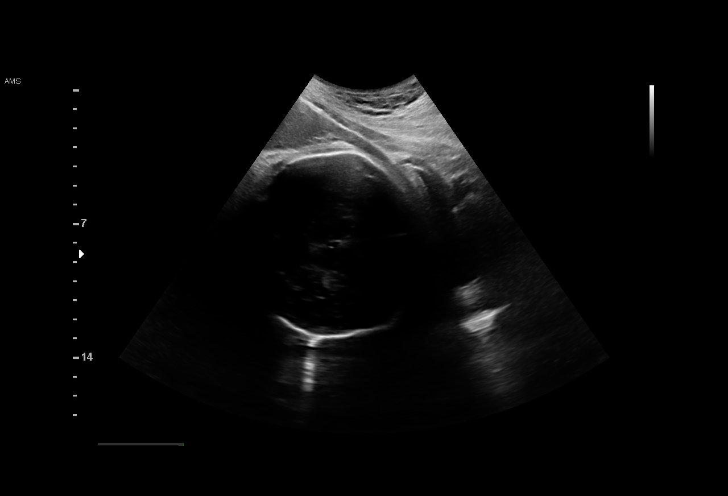
[im 3/24]
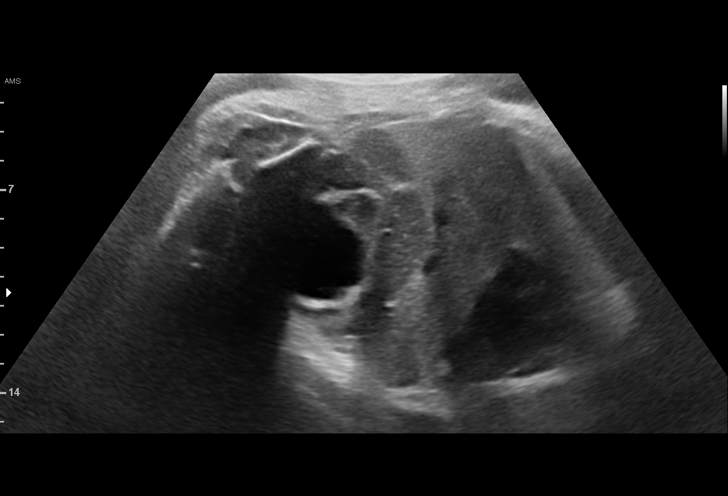
[im 5/24]
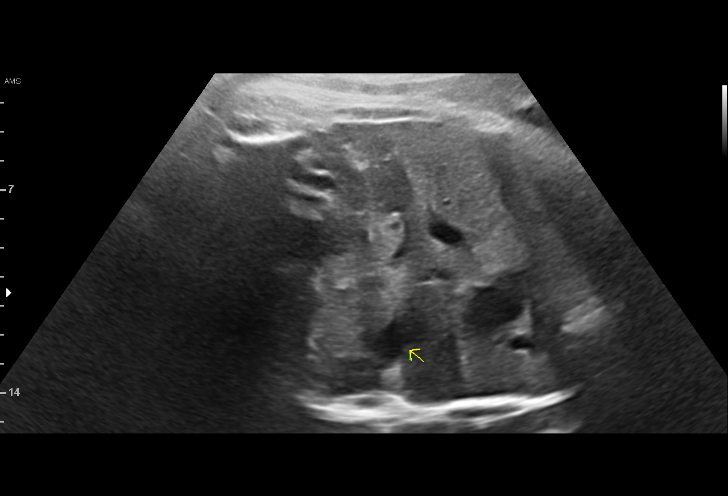
[im 7/24]
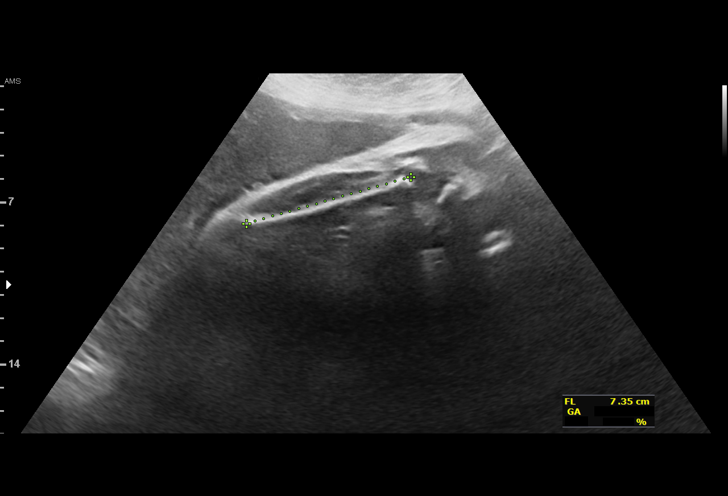
[im 8/24]
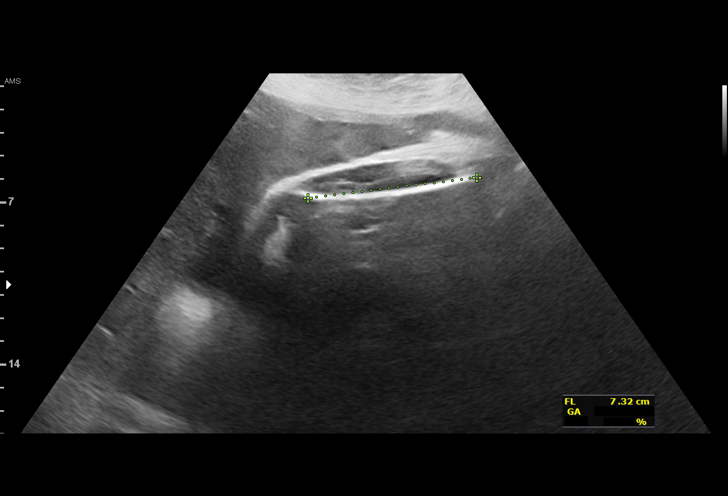
[im 10/24]
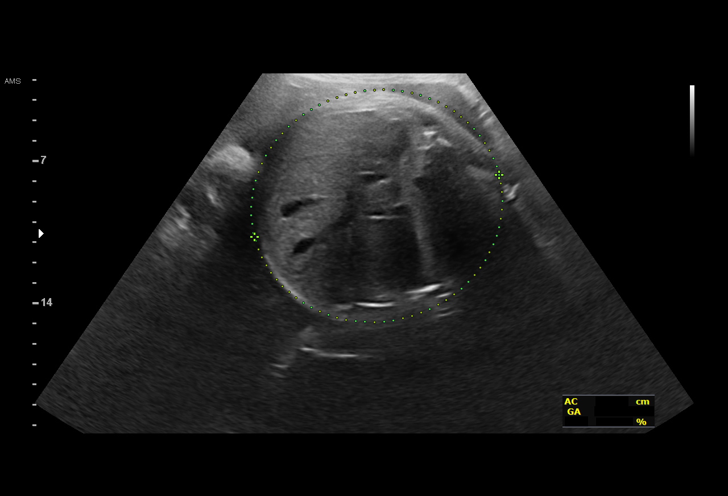
[im 12/24]
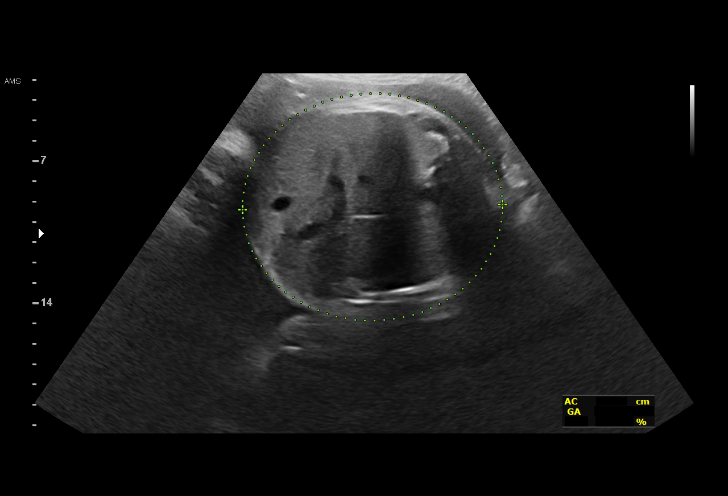
[im 13/24]
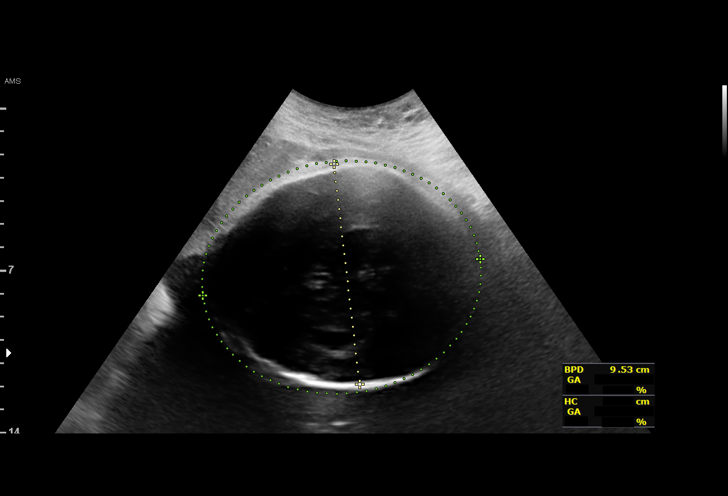
[im 15/24]
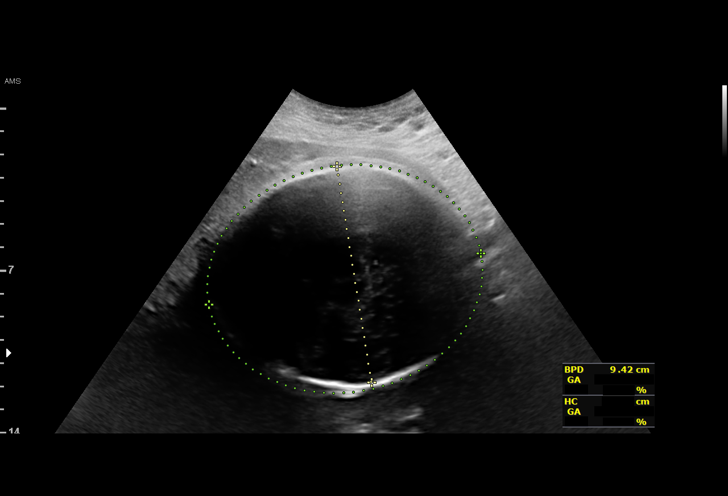
[im 17/24]
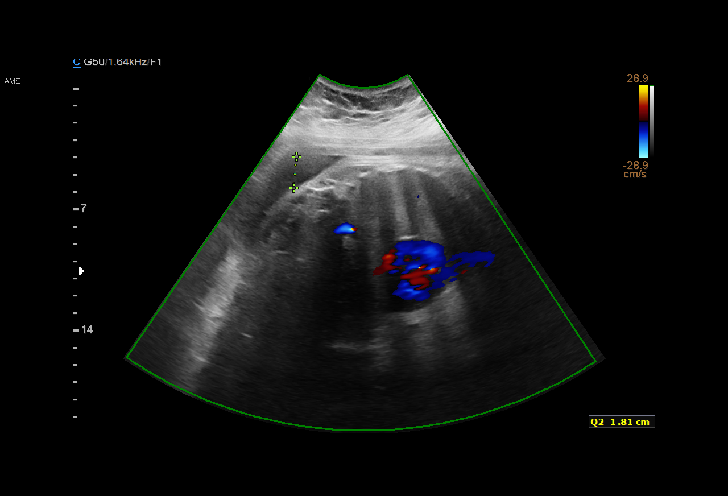
[im 19/24]
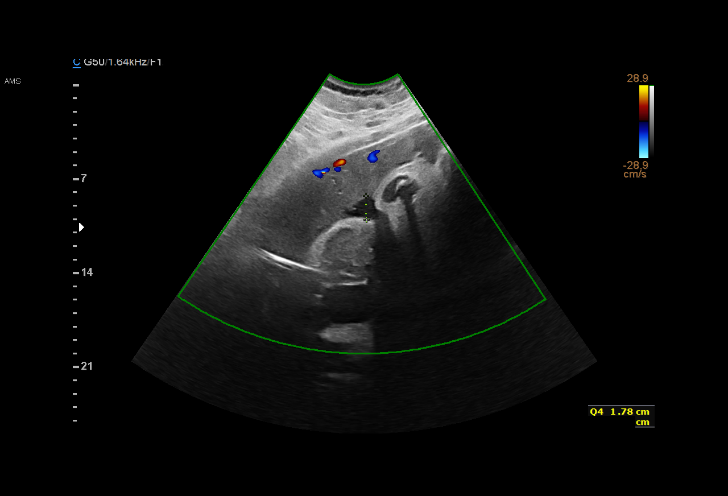
[im 20/24]
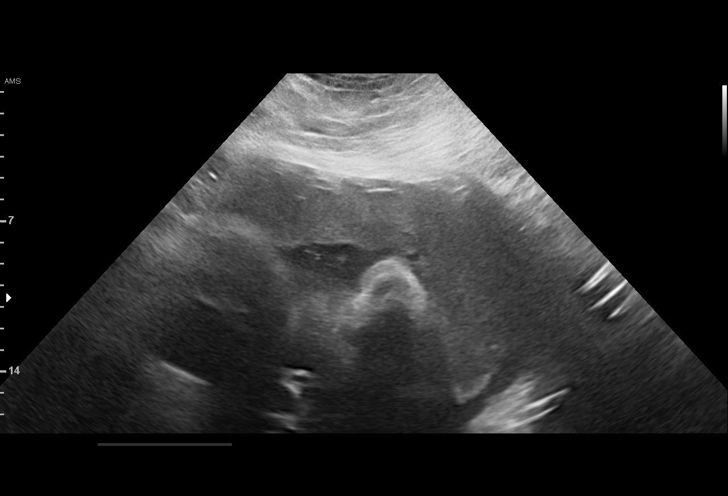
[im 22/24]
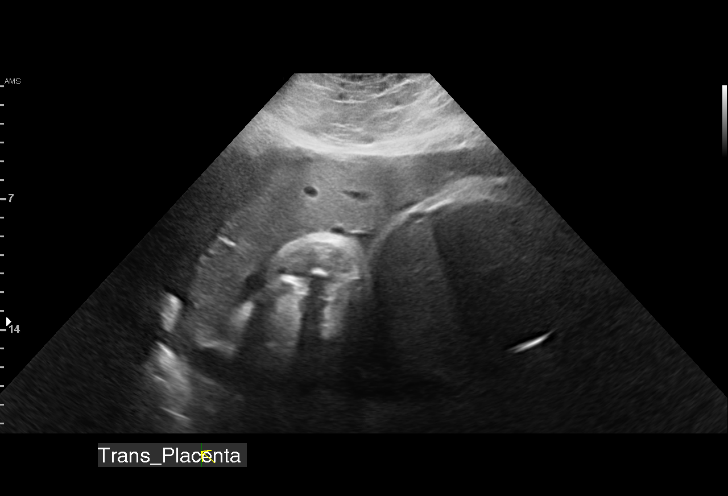
[im 24/24]
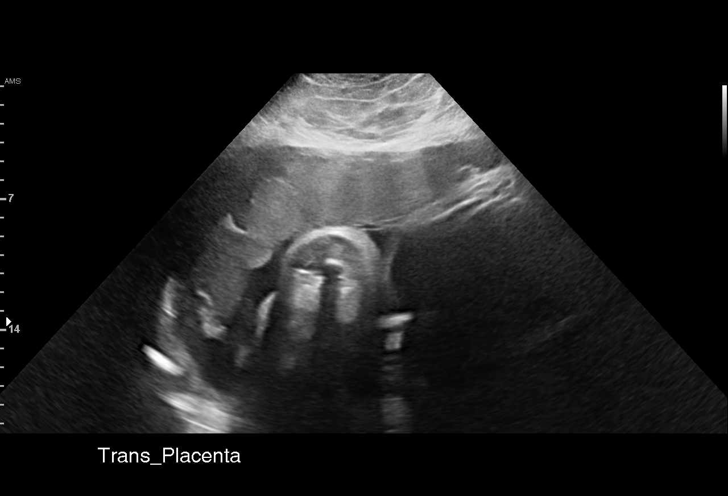

[14 of 24 positions shown; findings below may reference images not displayed]

OB/Gyn Clinic

 ----------------------------------------------------------------------

 ----------------------------------------------------------------------
Indications

  Gestational diabetes in pregnancy, insulin     [0I]
  controlled
  38 weeks gestation of pregnancy
  Unspecified pre-existing hypertension          [0I]
  complicating pregnancy, unspecified
  trimester
  History of cesarean delivery, currently        [0I]
  pregnant
 ----------------------------------------------------------------------
Vital Signs

                                                Height:        5'0"
Fetal Evaluation

 Num Of Fetuses:          1
 Fetal Heart Rate(bpm):   154
 Cardiac Activity:        Observed
 Presentation:            Cephalic
 Placenta:                Anterior

 Amniotic Fluid
 AFI FV:      Within normal limits

 AFI Sum(cm)     %Tile       Largest Pocket(cm)
 9.15            20

 RUQ(cm)       RLQ(cm)       LUQ(cm)        LLQ(cm)

Biometry

 BPD:      94.7  mm     G. Age:  38w 4d         80  %    CI:        75.08   %    70 - 86
                                                         FL/HC:       21.2  %    20.9 -
 HC:      346.7  mm     G. Age:  40w 1d         75  %    HC/AC:       0.93       0.92 -
 AC:      373.6  mm     G. Age:  41w 2d       > 97  %    FL/BPD:      77.6  %    71 - 87
 FL:       73.5  mm     G. Age:  37w 5d         34  %    FL/AC:       19.7  %    20 - 24

 Est. FW:    [0I]   gm   8 lb 13 oz    > 90  %
OB History

 Gravidity:    2         Term:   1        Prem:   0        SAB:   0
 TOP:          0       Ectopic:  0        Living: 1
Gestational Age

 LMP:           33w 4d        Date:  [DATE]                 EDD:   [DATE]
 U/S Today:     39w 3d                                        EDD:   [DATE]
 Best:          38w 3d     Det. By:  Early Ultrasound         EDD:   [DATE]
                                     ([DATE])
Anatomy

 Cranium:               Appears normal         Aortic Arch:            Not well visualized
 Cavum:                 Previously seen        Ductal Arch:            Not well visualized
 Ventricles:            Previously seen        Diaphragm:              Previously seen
 Choroid Plexus:        Previously seen        Stomach:                Appears normal, left
                                                                       sided
 Cerebellum:            Previously seen        Abdomen:                Appears normal
 Posterior Fossa:       Previously seen        Abdominal Wall:         Previously seen
 Nuchal Fold:           Previously seen        Cord Vessels:           Previously seen
 Face:                  Orbits previously      Kidneys:                Appear normal
                        seen, Profile wnl
 Lips:                  Previously seen        Bladder:                Appears normal
 Thoracic:              Appears normal         Spine:                  Not well visualized
 Heart:                 Previously seen        Upper Extremities:      Previously seen
 RVOT:                  Previously seen        Lower Extremities:      Previously seen
 LVOT:                  Previously seen

 Other:  Male gender. Technically difficult due to maternal habitus and fetal
         position.
Impression

 Normal interval growth.
Recommendations

 Consider delivery at 39 weeks
 No further follow up scheduled at this time.

## 2019-01-02 ENCOUNTER — Encounter (HOSPITAL_COMMUNITY)
Admission: RE | Admit: 2019-01-02 | Discharge: 2019-01-02 | Disposition: A | Discharge status: Home / Self Care | Payer: Medicaid Other | Source: Ambulatory Visit | Attending: Obstetrics and Gynecology | Admitting: Obstetrics and Gynecology

## 2019-01-02 ENCOUNTER — Ambulatory Visit (INDEPENDENT_AMBULATORY_CARE_PROVIDER_SITE_OTHER): Payer: Medicaid Other | Admitting: Obstetrics and Gynecology

## 2019-01-02 ENCOUNTER — Ambulatory Visit: Payer: Self-pay

## 2019-01-02 ENCOUNTER — Encounter: Payer: Self-pay | Admitting: Obstetrics and Gynecology

## 2019-01-02 ENCOUNTER — Other Ambulatory Visit: Payer: Self-pay

## 2019-01-02 ENCOUNTER — Ambulatory Visit (INDEPENDENT_AMBULATORY_CARE_PROVIDER_SITE_OTHER): Payer: Medicaid Other | Admitting: *Deleted

## 2019-01-02 VITALS — BP 132/74 | HR 94 | Wt 296.9 lb

## 2019-01-02 DIAGNOSIS — O24414 Gestational diabetes mellitus in pregnancy, insulin controlled: Secondary | ICD-10-CM

## 2019-01-02 DIAGNOSIS — O0993 Supervision of high risk pregnancy, unspecified, third trimester: Secondary | ICD-10-CM

## 2019-01-02 DIAGNOSIS — O10919 Unspecified pre-existing hypertension complicating pregnancy, unspecified trimester: Secondary | ICD-10-CM

## 2019-01-02 DIAGNOSIS — O34219 Maternal care for unspecified type scar from previous cesarean delivery: Secondary | ICD-10-CM

## 2019-01-02 DIAGNOSIS — O10913 Unspecified pre-existing hypertension complicating pregnancy, third trimester: Secondary | ICD-10-CM

## 2019-01-02 DIAGNOSIS — O0990 Supervision of high risk pregnancy, unspecified, unspecified trimester: Secondary | ICD-10-CM

## 2019-01-02 DIAGNOSIS — O99820 Streptococcus B carrier state complicating pregnancy: Secondary | ICD-10-CM

## 2019-01-02 LAB — CBC
HCT: 39.1 % (ref 36.0–46.0)
Hemoglobin: 13 g/dL (ref 12.0–15.0)
MCH: 26.6 pg (ref 26.0–34.0)
MCHC: 33.2 g/dL (ref 30.0–36.0)
MCV: 80.1 fL (ref 80.0–100.0)
NRBC: 0 % (ref 0.0–0.2)
Platelets: 213 10*3/uL (ref 150–400)
RBC: 4.88 MIL/uL (ref 3.87–5.11)
RDW: 15.3 % (ref 11.5–15.5)
WBC: 9.3 10*3/uL (ref 4.0–10.5)

## 2019-01-02 LAB — TYPE AND SCREEN
ABO/RH(D): A POS
Antibody Screen: NEGATIVE

## 2019-01-02 IMAGING — US US FETAL BPP W/ NON-STRESS
1 series · 13 of 14 positions shown · non-contrast
Comparison: none

[Series 1: us fetal bpp w/nonstress · 14 acquisitions, 13 frames shown]
[im 1/14]
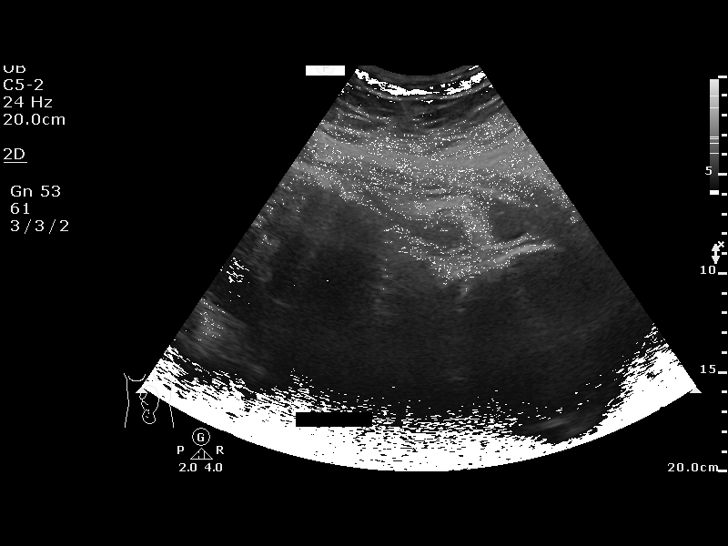
[im 2/14]
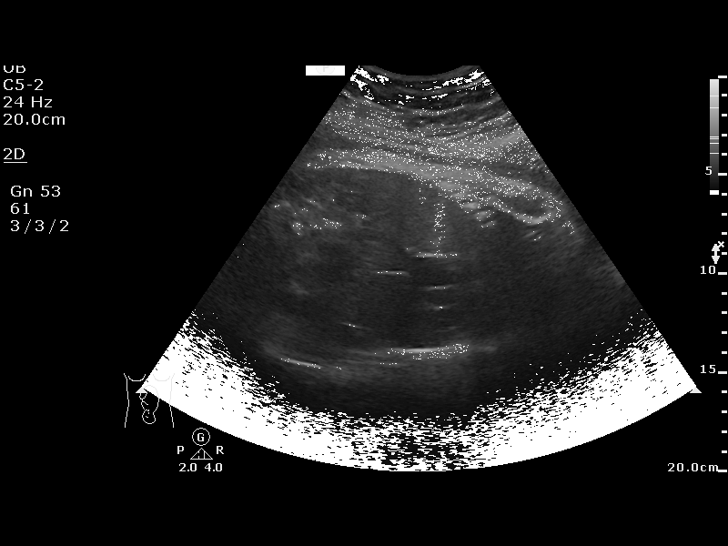
[im 3/14]
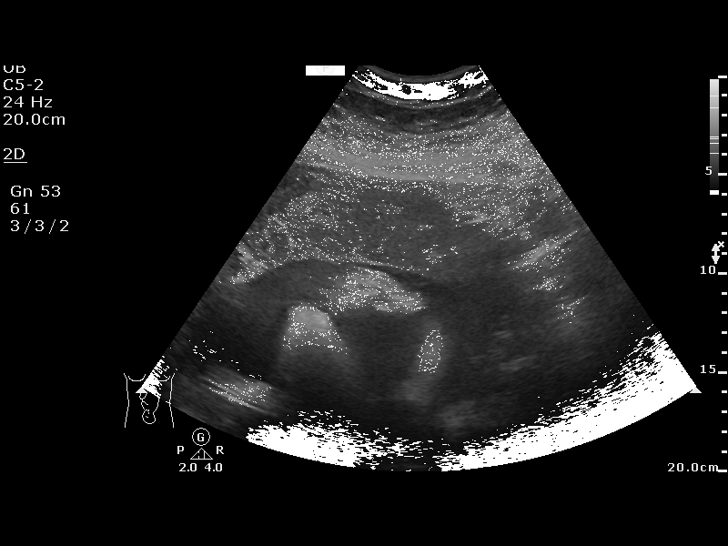
[im 4/14]
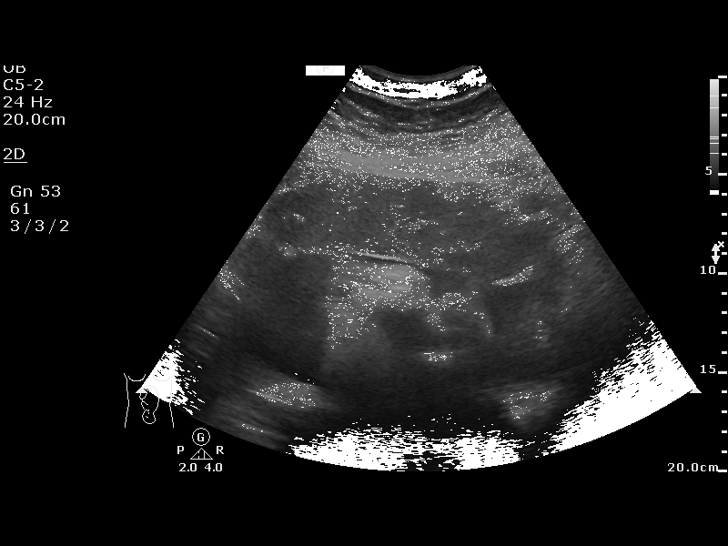
[im 5/14]
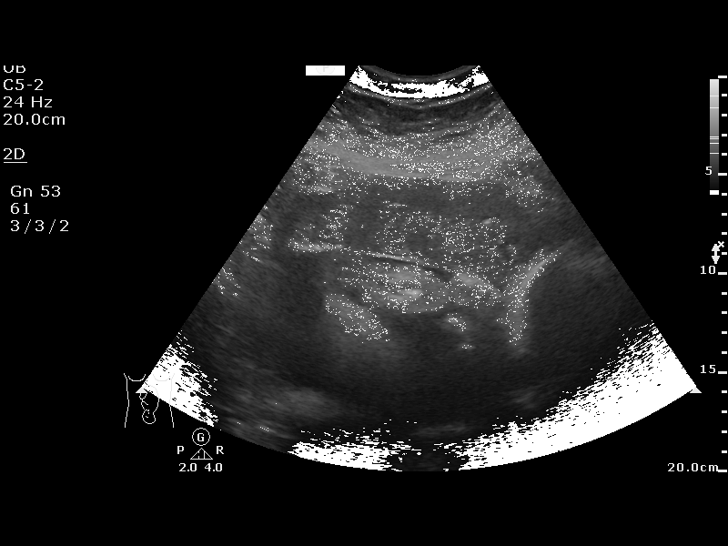
[im 6/14]
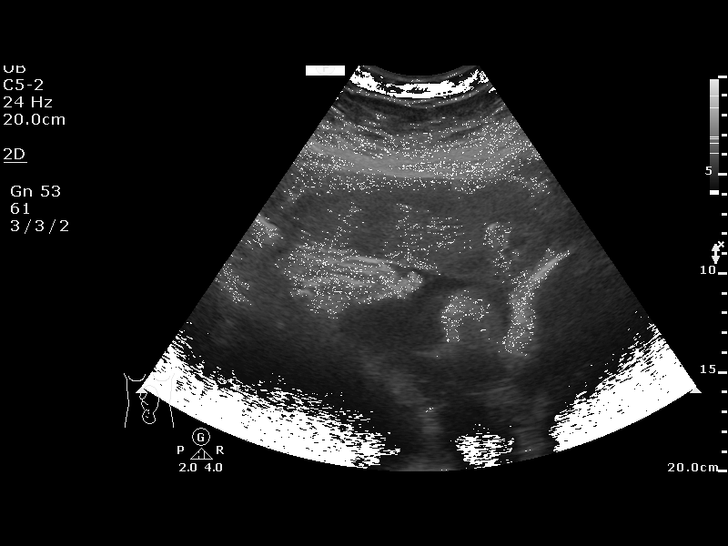
[im 8/14]
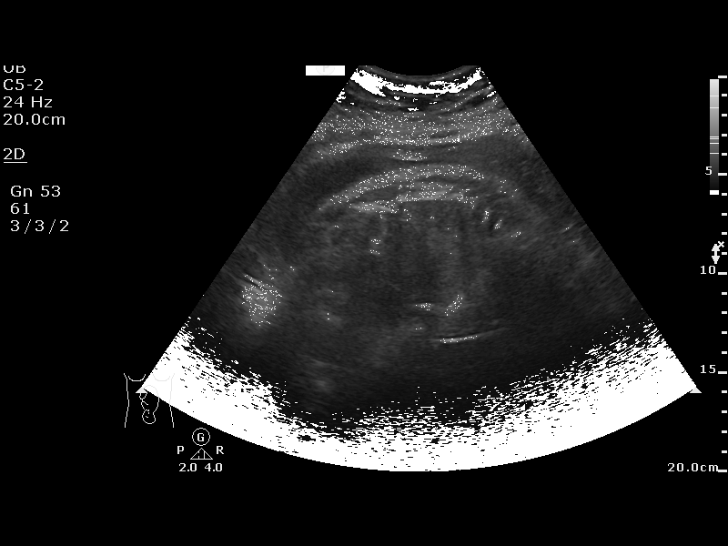
[im 9/14]
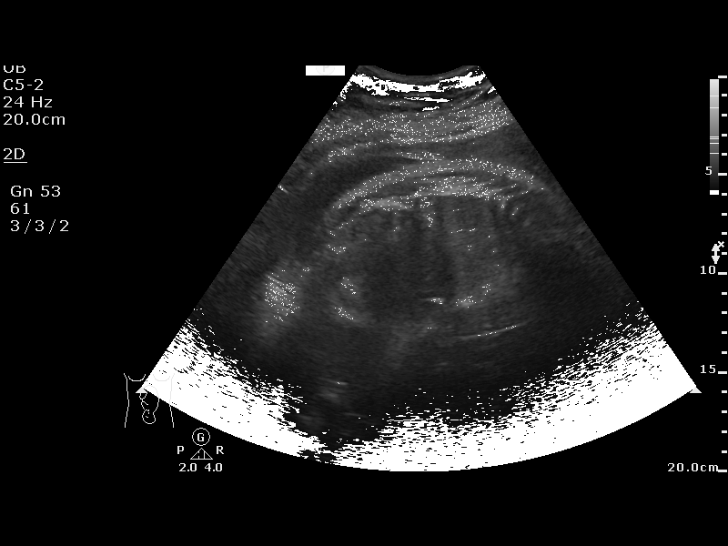
[im 10/14]
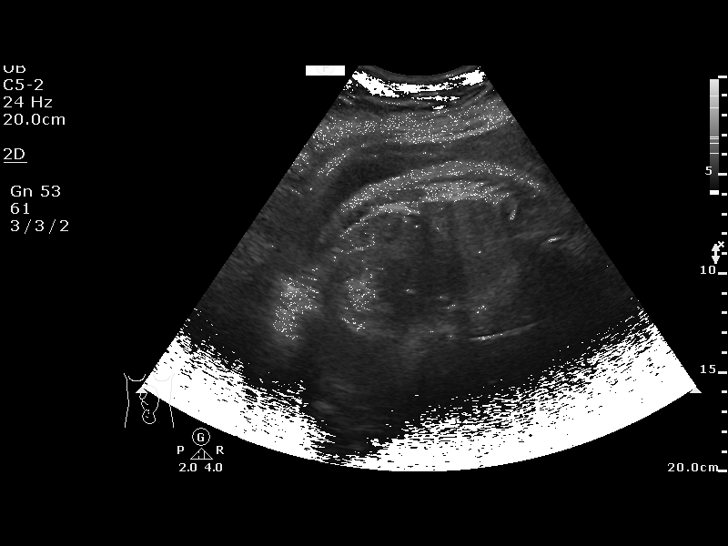
[im 11/14]
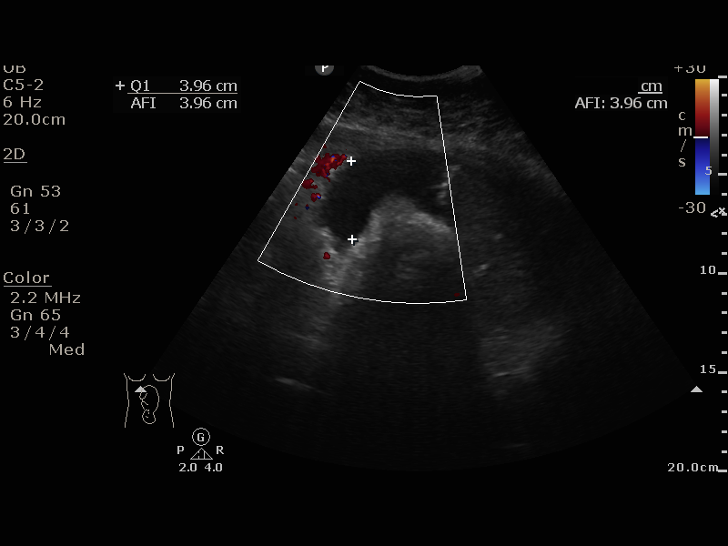
[im 12/14]
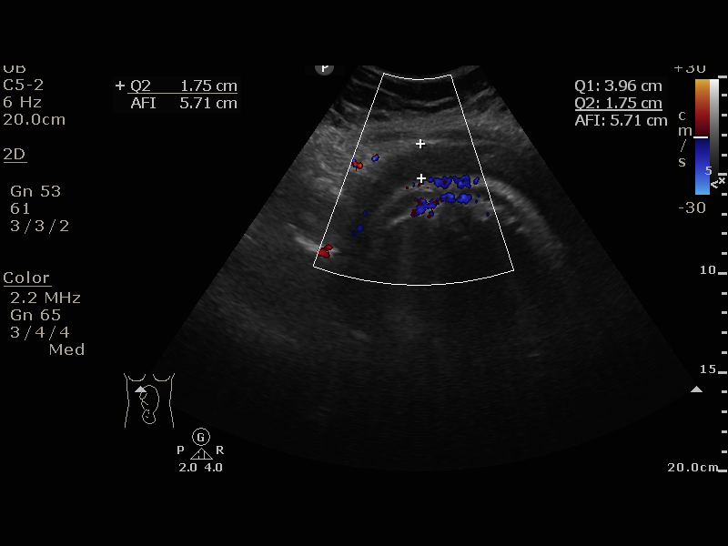
[im 13/14]
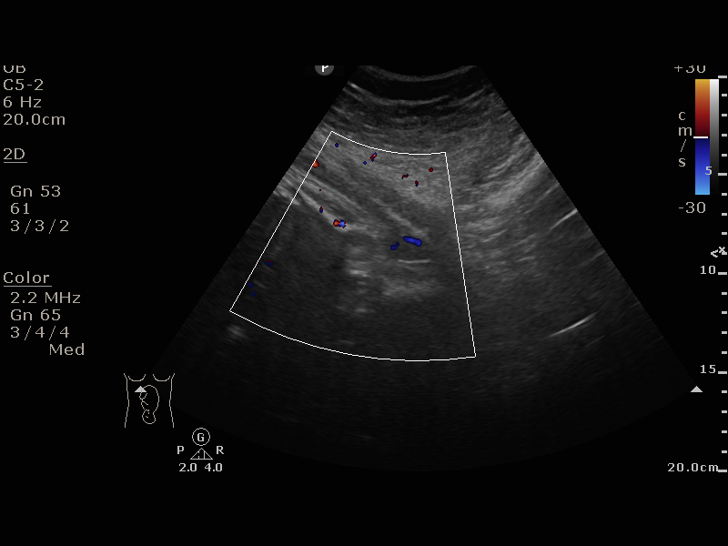
[im 14/14]
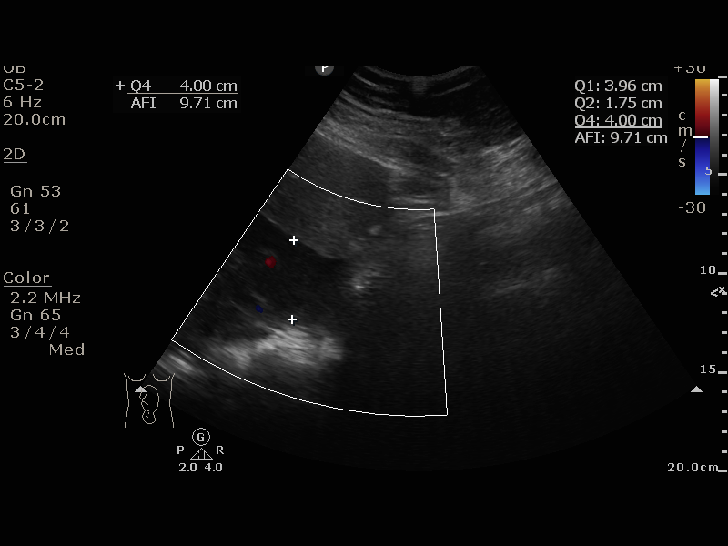

[13 of 14 positions shown; findings below may reference images not displayed]

OB/Gyn Clinic
                                                            [REDACTED]care

  1  US FETAL BPP W/NONSTRESS             76818.4      MARINICA
 ----------------------------------------------------------------------

 ----------------------------------------------------------------------
Service(s) Provided

 ----------------------------------------------------------------------
Indications

  38 weeks gestation of pregnancy
  Unspecified pre-existing hypertension          [GA]
  complicating pregnancy, third trimester
  Gestational diabetes in pregnancy, insulin     [GA]
  controlled
 ----------------------------------------------------------------------
Vital Signs

                                                Height:        5'0"
Fetal Evaluation

 Num Of Fetuses:         1
 Preg. Location:         Intrauterine
 Cardiac Activity:       Observed
 Presentation:           Cephalic

 Amniotic Fluid
 AFI FV:      Within normal limits

 AFI Sum(cm)     %Tile       Largest Pocket(cm)
 9.71            25          4
 RUQ(cm)       RLQ(cm)       LUQ(cm)        LLQ(cm)
 3.96          4             1.75           0
Biophysical Evaluation

 Amniotic F.V:   Pocket => 2 cm two         F. Tone:        Observed
                 planes
 F. Movement:    Observed                   N.S.T:          Reactive
 F. Breathing:   Observed                   Score:          [DATE]
OB History

 Gravidity:    2         Term:   1        Prem:   0        SAB:   0
 TOP:          0       Ectopic:  0        Living: 1
Gestational Age

 LMP:           33w 6d        Date:  [DATE]                 EDD:   [DATE]
 Best:          38w 5d     Det. By:  Early Ultrasound         EDD:   [DATE]
                                     ([DATE])
Impression

 Vertex
Recommendations

 Continue with antenatal testing as indicated

## 2019-01-02 NOTE — Progress Notes (Signed)
Pt had [place] for growth on 2/4. Repeat C/S scheduled on 2/8.   Pt informed that the ultrasound is considered a limited OB ultrasound and is not intended to be a complete ultrasound exam.  Patient also informed that the ultrasound is not being completed with the intent of assessing for fetal or placental anomalies or any pelvic abnormalities.  Explained that the purpose of today's ultrasound is to assess for  .  Patient acknowledges the purpose of the exam and the limitations of the study.

## 2019-01-02 NOTE — Patient Instructions (Signed)

## 2019-01-02 NOTE — Patient Instructions (Signed)
ALAZIA SCHURIG  01/02/2019   Your procedure is scheduled on:  0930  Enter through the Main Entrance of [facility] at 01/04/2019 AM.  Pick up the phone at the desk and dial [phone]  Call this number if you have problems the morning of surgery:[phone]  Remember:   Do not eat food:(After Midnight) Desps de medianoche.  Do not drink clear liquids: (After Midnight) Desps de medianoche.  Take these medicines the morning of surgery with A SIP OF WATER: take 8 units of insulin at bedtime the night before surgery.  NO INSULIN THE DAY OF SURGERY   Do not wear jewelry, make-up or nail polish.  Do not wear lotions, powders, or perfumes. Do not wear deodorant.  Do not shave 48 hours prior to surgery.  Do not bring valuables to the hospital.  [facility] is not   responsible for any belongings or valuables brought to the hospital.  Contacts, dentures or bridgework may not be worn into surgery.  Leave suitcase in the car. After surgery it may be brought to your room.  For patients admitted to the hospital, checkout time is 11:00 AM the day of              discharge.    N/A   Please read over the following fact sheets that you were given:   Surgical Site Infection Prevention

## 2019-01-02 NOTE — Progress Notes (Signed)
Subjective:  Alexandra Gray is a 28 y.o. G2P1001 at [redacted]w[redacted]d being seen today for ongoing prenatal care.  She is currently monitored for the following issues for this high-risk pregnancy and has MDD (major depressive disorder), recurrent severe, without psychosis (HCC); Suicidal ideation; Supervision of high risk pregnancy, antepartum; History of cesarean delivery affecting pregnancy; BMI 50.0-59.9, adult (HCC); Obesity in pregnancy; Chronic hypertension during pregnancy, antepartum; GDM, class A2; Cluster B personality disorder (HCC); PTSD (post-traumatic stress disorder); Sleep apnea; Severe major depression without psychotic features (HCC); and GBS (group B Streptococcus carrier), +RV culture, currently pregnant on their problem list.  Patient reports no complaints.   .  .   . Denies leaking of fluid.   The following portions of the patient's history were reviewed and updated as appropriate: allergies, current medications, past family history, past medical history, past social history, past surgical history and problem list. Problem list updated.  Objective:  There were no vitals filed for this visit.  Fetal Status:           General:  Alert, oriented and cooperative. Patient is in no acute distress.  Skin: Skin is warm and dry. No rash noted.   Cardiovascular: Normal heart rate noted  Respiratory: Normal respiratory effort, no problems with respiration noted  Abdomen: Soft, gravid, appropriate for gestational age.       Pelvic:  Cervical exam deferred        Extremities: Normal range of motion.     Mental Status: Normal mood and affect. Normal behavior. Normal judgment and thought content.   Urinalysis:      Assessment and Plan:  Pregnancy: G2P1001 at [redacted]w[redacted]d  1. Supervision of high risk pregnancy, antepartum Stable  2. Chronic hypertension during pregnancy, antepartum BP stable without meds No S/Sx of PEC  3. Insulin controlled gestational diabetes mellitus (GDM) in third  trimester CBG's in goal range Continue with current regiment BPP 10/10 today  4. History of cesarean delivery affecting pregnancy For repeat c section this Sat  5. GBS (group B Streptococcus carrier), +RV culture, currently pregnant   Term labor symptoms and general obstetric precautions including but not limited to vaginal bleeding, contractions, leaking of fluid and fetal movement were reviewed in detail with the patient. Please refer to After Visit Summary for other counseling recommendations.  Return in about 19 days (around 01/21/2019) for Nurse visit for wound check, Then needs PP visit w/2hr GTT  on 3/23.   Hermina Staggers, MD

## 2019-01-03 ENCOUNTER — Encounter (HOSPITAL_COMMUNITY): Payer: Self-pay | Admitting: Anesthesiology

## 2019-01-03 ENCOUNTER — Encounter (HOSPITAL_COMMUNITY): Admission: RE | Admit: 2019-01-03 | Payer: Medicaid Other | Source: Ambulatory Visit

## 2019-01-03 LAB — RPR: RPR Ser Ql: NONREACTIVE

## 2019-01-03 NOTE — Anesthesia Preprocedure Evaluation (Addendum)
Anesthesia Evaluation  Patient identified by MRN, date of birth, ID band Patient awake    Reviewed: Allergy & Precautions, NPO status , Patient's Chart, lab work & pertinent test results  Airway Mallampati: III  TM Distance: >3 FB Neck ROM: Full    Dental no notable dental hx. (+) Teeth Intact   Pulmonary sleep apnea and Continuous Positive Airway Pressure Ventilation ,    Pulmonary exam normal breath sounds clear to auscultation       Cardiovascular hypertension, Pt. on medications Normal cardiovascular exam Rhythm:Regular Rate:Normal     Neuro/Psych PSYCHIATRIC DISORDERS Anxiety Depression Personality disordernegative neurological ROS     GI/Hepatic Neg liver ROS, GERD  ,  Endo/Other  diabetes, Well Controlled, Type 2, Insulin DependentMorbid obesity  Renal/GU   negative genitourinary   Musculoskeletal negative musculoskeletal ROS (+)   Abdominal (+) + obese,   Peds  Hematology negative hematology ROS (+)   Anesthesia Other Findings   Reproductive/Obstetrics (+) Pregnancy Previous C/Section cHTN with superimposed gestational HTN                            Anesthesia Physical Anesthesia Plan  ASA: III  Anesthesia Plan: Spinal   Post-op Pain Management:    Induction: Intravenous  PONV Risk Score and Plan: 4 or greater and Scopolamine patch - Pre-op, Ondansetron, Dexamethasone and Treatment may vary due to age or medical condition  Airway Management Planned: Natural Airway  Additional Equipment:   Intra-op Plan:   Post-operative Plan:   Informed Consent: I have reviewed the patients History and Physical, chart, labs and discussed the procedure including the risks, benefits and alternatives for the proposed anesthesia with the patient or authorized representative who has indicated his/her understanding and acceptance.     Dental advisory given  Plan Discussed with: CRNA  and Surgeon  Anesthesia Plan Comments:        Anesthesia Quick Evaluation

## 2019-01-04 ENCOUNTER — Encounter (HOSPITAL_COMMUNITY)
Admission: RE | Disposition: A | Discharge status: Home / Self Care | Payer: Self-pay | Source: Home / Self Care | Attending: Obstetrics and Gynecology

## 2019-01-04 ENCOUNTER — Other Ambulatory Visit: Payer: Self-pay

## 2019-01-04 ENCOUNTER — Inpatient Hospital Stay (HOSPITAL_COMMUNITY): Payer: Medicaid Other | Admitting: Anesthesiology

## 2019-01-04 ENCOUNTER — Encounter (HOSPITAL_COMMUNITY): Payer: Self-pay | Admitting: Anesthesiology

## 2019-01-04 ENCOUNTER — Inpatient Hospital Stay (HOSPITAL_COMMUNITY)
Admission: RE | Admit: 2019-01-04 | Discharge: 2019-01-07 | DRG: 787 | Disposition: A | Discharge status: Home / Self Care | Payer: Medicaid Other | Attending: Obstetrics and Gynecology | Admitting: Obstetrics and Gynecology

## 2019-01-04 DIAGNOSIS — O24419 Gestational diabetes mellitus in pregnancy, unspecified control: Secondary | ICD-10-CM

## 2019-01-04 DIAGNOSIS — O99344 Other mental disorders complicating childbirth: Secondary | ICD-10-CM | POA: Diagnosis present

## 2019-01-04 DIAGNOSIS — O24424 Gestational diabetes mellitus in childbirth, insulin controlled: Secondary | ICD-10-CM | POA: Diagnosis present

## 2019-01-04 DIAGNOSIS — Z3A39 39 weeks gestation of pregnancy: Secondary | ICD-10-CM | POA: Diagnosis not present

## 2019-01-04 DIAGNOSIS — O99824 Streptococcus B carrier state complicating childbirth: Secondary | ICD-10-CM | POA: Diagnosis present

## 2019-01-04 DIAGNOSIS — F6089 Other specific personality disorders: Secondary | ICD-10-CM | POA: Diagnosis present

## 2019-01-04 DIAGNOSIS — O99210 Obesity complicating pregnancy, unspecified trimester: Secondary | ICD-10-CM | POA: Diagnosis present

## 2019-01-04 DIAGNOSIS — O34211 Maternal care for low transverse scar from previous cesarean delivery: Principal | ICD-10-CM | POA: Diagnosis present

## 2019-01-04 DIAGNOSIS — O99214 Obesity complicating childbirth: Secondary | ICD-10-CM | POA: Diagnosis present

## 2019-01-04 DIAGNOSIS — O1002 Pre-existing essential hypertension complicating childbirth: Secondary | ICD-10-CM | POA: Diagnosis present

## 2019-01-04 DIAGNOSIS — Z98891 History of uterine scar from previous surgery: Secondary | ICD-10-CM

## 2019-01-04 DIAGNOSIS — O99820 Streptococcus B carrier state complicating pregnancy: Secondary | ICD-10-CM

## 2019-01-04 DIAGNOSIS — F332 Major depressive disorder, recurrent severe without psychotic features: Secondary | ICD-10-CM | POA: Diagnosis present

## 2019-01-04 DIAGNOSIS — Z6841 Body Mass Index (BMI) 40.0 and over, adult: Secondary | ICD-10-CM

## 2019-01-04 DIAGNOSIS — O10919 Unspecified pre-existing hypertension complicating pregnancy, unspecified trimester: Secondary | ICD-10-CM | POA: Diagnosis present

## 2019-01-04 DIAGNOSIS — O34219 Maternal care for unspecified type scar from previous cesarean delivery: Secondary | ICD-10-CM | POA: Diagnosis present

## 2019-01-04 DIAGNOSIS — O0990 Supervision of high risk pregnancy, unspecified, unspecified trimester: Secondary | ICD-10-CM

## 2019-01-04 HISTORY — DX: History of uterine scar from previous surgery: Z98.891

## 2019-01-04 HISTORY — DX: Gestational diabetes mellitus in pregnancy, unspecified control: O24.419

## 2019-01-04 LAB — GLUCOSE, CAPILLARY
Glucose-Capillary: 53 mg/dL — ABNORMAL LOW (ref 70–99)
Glucose-Capillary: 95 mg/dL (ref 70–99)

## 2019-01-04 LAB — CBC
HCT: 40 % (ref 36.0–46.0)
Hemoglobin: 12.9 g/dL (ref 12.0–15.0)
MCH: 26.3 pg (ref 26.0–34.0)
MCHC: 32.3 g/dL (ref 30.0–36.0)
MCV: 81.6 fL (ref 80.0–100.0)
Platelets: 185 10*3/uL (ref 150–400)
RBC: 4.9 MIL/uL (ref 3.87–5.11)
RDW: 15.2 % (ref 11.5–15.5)
WBC: 12.2 10*3/uL — ABNORMAL HIGH (ref 4.0–10.5)
nRBC: 0 % (ref 0.0–0.2)

## 2019-01-04 LAB — CREATININE, SERUM
Creatinine, Ser: 0.61 mg/dL (ref 0.44–1.00)
GFR calc Af Amer: >60 mL/min (ref >60)
GFR calc non Af Amer: >60 mL/min (ref >60)

## 2019-01-04 SURGERY — Surgical Case
Anesthesia: Spinal | Site: Abdomen | Wound class: Clean Contaminated

## 2019-01-04 MED ORDER — SERTRALINE HCL 50 MG PO TABS
50.0000 mg | ORAL_TABLET | Freq: Every day | ORAL | Status: DC
  Administered 2019-01-04 – 2019-01-07 (×4): 50 mg via ORAL
  Filled 2019-01-04 (×4): qty 1

## 2019-01-04 MED ORDER — ZOLPIDEM TARTRATE 5 MG PO TABS: 5.0000 mg | ORAL_TABLET | Freq: Every evening | ORAL | Status: DC | PRN

## 2019-01-04 MED ORDER — OXYTOCIN 40 UNITS IN NORMAL SALINE INFUSION - SIMPLE MED: 2.5000 [IU]/h | INTRAVENOUS | Status: AC

## 2019-01-04 MED ORDER — SIMETHICONE 80 MG PO CHEW
80.0000 mg | CHEWABLE_TABLET | ORAL | Status: DC
  Administered 2019-01-04 – 2019-01-07 (×3): 80 mg via ORAL
  Filled 2019-01-04 (×3): qty 1

## 2019-01-04 MED ORDER — KETOROLAC TROMETHAMINE 30 MG/ML IJ SOLN
30.0000 mg | Freq: Four times a day (QID) | INTRAMUSCULAR | Status: AC | PRN
  Administered 2019-01-05: 30 mg via INTRAVENOUS

## 2019-01-04 MED ORDER — FUROSEMIDE 10 MG/ML IJ SOLN
INTRAMUSCULAR | Status: DC | PRN
  Administered 2019-01-04: 10 mg via INTRAVENOUS

## 2019-01-04 MED ORDER — LACTATED RINGERS IV SOLN
INTRAVENOUS | Status: DC
  Administered 2019-01-04 (×2): via INTRAVENOUS

## 2019-01-04 MED ORDER — NALOXONE HCL 4 MG/10ML IJ SOLN
1.0000 ug/kg/h | INTRAVENOUS | Status: DC | PRN
  Filled 2019-01-04: qty 5

## 2019-01-04 MED ORDER — PHENYLEPHRINE 8 MG IN D5W 100 ML (0.08MG/ML) PREMIX OPTIME
INJECTION | INTRAVENOUS | Status: AC
  Filled 2019-01-04: qty 100

## 2019-01-04 MED ORDER — PRENATAL MULTIVITAMIN CH
1.0000 | ORAL_TABLET | Freq: Every day | ORAL | Status: DC
  Administered 2019-01-05 – 2019-01-06 (×2): 1 via ORAL
  Filled 2019-01-04 (×3): qty 1

## 2019-01-04 MED ORDER — OXYTOCIN 10 UNIT/ML IJ SOLN
INTRAVENOUS | Status: DC | PRN
  Administered 2019-01-04: 40 [IU] via INTRAVENOUS

## 2019-01-04 MED ORDER — SENNOSIDES-DOCUSATE SODIUM 8.6-50 MG PO TABS
2.0000 | ORAL_TABLET | ORAL | Status: DC
  Administered 2019-01-04 – 2019-01-07 (×3): 2 via ORAL
  Filled 2019-01-04 (×3): qty 2

## 2019-01-04 MED ORDER — DIPHENHYDRAMINE HCL 25 MG PO CAPS: 25.0000 mg | ORAL_CAPSULE | ORAL | Status: DC | PRN

## 2019-01-04 MED ORDER — MORPHINE SULFATE (PF) 0.5 MG/ML IJ SOLN
INTRAMUSCULAR | Status: AC
  Filled 2019-01-04: qty 10

## 2019-01-04 MED ORDER — MORPHINE SULFATE (PF) 0.5 MG/ML IJ SOLN
INTRAMUSCULAR | Status: DC | PRN
  Administered 2019-01-04: .15 mg via INTRATHECAL

## 2019-01-04 MED ORDER — ONDANSETRON HCL 4 MG/2ML IJ SOLN: 4.0000 mg | Freq: Three times a day (TID) | INTRAMUSCULAR | Status: DC | PRN

## 2019-01-04 MED ORDER — BUPIVACAINE IN DEXTROSE 0.75-8.25 % IT SOLN
INTRATHECAL | Status: DC | PRN
  Administered 2019-01-04: 1.5 mL via INTRATHECAL

## 2019-01-04 MED ORDER — KETOROLAC TROMETHAMINE 30 MG/ML IJ SOLN
30.0000 mg | Freq: Four times a day (QID) | INTRAMUSCULAR | Status: AC
  Administered 2019-01-04 – 2019-01-05 (×2): 30 mg via INTRAVENOUS
  Filled 2019-01-04 (×3): qty 1

## 2019-01-04 MED ORDER — FENTANYL CITRATE (PF) 100 MCG/2ML IJ SOLN
INTRAMUSCULAR | Status: DC | PRN
  Administered 2019-01-04: 15 ug via INTRATHECAL

## 2019-01-04 MED ORDER — SODIUM CHLORIDE 0.9% FLUSH: 3.0000 mL | INTRAVENOUS | Status: DC | PRN

## 2019-01-04 MED ORDER — WITCH HAZEL-GLYCERIN EX PADS: 1.0000 "application " | MEDICATED_PAD | CUTANEOUS | Status: DC | PRN

## 2019-01-04 MED ORDER — ACETAMINOPHEN 325 MG PO TABS
650.0000 mg | ORAL_TABLET | Freq: Four times a day (QID) | ORAL | Status: DC | PRN
  Administered 2019-01-04 – 2019-01-06 (×4): 650 mg via ORAL
  Filled 2019-01-04 (×4): qty 2

## 2019-01-04 MED ORDER — SOD CITRATE-CITRIC ACID 500-334 MG/5ML PO SOLN
30.0000 mL | Freq: Once | ORAL | Status: AC
  Administered 2019-01-04: 30 mL via ORAL

## 2019-01-04 MED ORDER — LACTATED RINGERS IV SOLN
INTRAVENOUS | Status: DC
  Administered 2019-01-05 (×2): via INTRAVENOUS

## 2019-01-04 MED ORDER — KETOROLAC TROMETHAMINE 30 MG/ML IJ SOLN: 30.0000 mg | Freq: Four times a day (QID) | INTRAMUSCULAR | Status: AC | PRN

## 2019-01-04 MED ORDER — TETANUS-DIPHTH-ACELL PERTUSSIS 5-2.5-18.5 LF-MCG/0.5 IM SUSP: 0.5000 mL | Freq: Once | INTRAMUSCULAR | Status: DC

## 2019-01-04 MED ORDER — OXYTOCIN 10 UNIT/ML IJ SOLN
INTRAMUSCULAR | Status: AC
  Filled 2019-01-04: qty 4

## 2019-01-04 MED ORDER — NALOXONE HCL 0.4 MG/ML IJ SOLN: 0.4000 mg | INTRAMUSCULAR | Status: DC | PRN

## 2019-01-04 MED ORDER — MEPERIDINE HCL 25 MG/ML IJ SOLN: 6.2500 mg | INTRAMUSCULAR | Status: DC | PRN

## 2019-01-04 MED ORDER — FENTANYL CITRATE (PF) 100 MCG/2ML IJ SOLN
INTRAMUSCULAR | Status: AC
  Filled 2019-01-04: qty 2

## 2019-01-04 MED ORDER — OXYCODONE HCL 5 MG PO TABS
5.0000 mg | ORAL_TABLET | ORAL | Status: DC | PRN
  Administered 2019-01-05: 10 mg via ORAL
  Administered 2019-01-05 – 2019-01-06 (×3): 5 mg via ORAL
  Administered 2019-01-06: 10 mg via ORAL
  Administered 2019-01-06 – 2019-01-07 (×2): 5 mg via ORAL
  Filled 2019-01-04: qty 2
  Filled 2019-01-04: qty 1
  Filled 2019-01-04: qty 2
  Filled 2019-01-04: qty 1
  Filled 2019-01-04: qty 2
  Filled 2019-01-04 (×2): qty 1
  Filled 2019-01-04: qty 2

## 2019-01-04 MED ORDER — ONDANSETRON HCL 4 MG/2ML IJ SOLN
INTRAMUSCULAR | Status: AC
  Filled 2019-01-04: qty 2

## 2019-01-04 MED ORDER — SIMETHICONE 80 MG PO CHEW
80.0000 mg | CHEWABLE_TABLET | Freq: Three times a day (TID) | ORAL | Status: DC
  Administered 2019-01-05 – 2019-01-07 (×7): 80 mg via ORAL
  Filled 2019-01-04 (×7): qty 1

## 2019-01-04 MED ORDER — COCONUT OIL OIL: 1.0000 "application " | TOPICAL_OIL | Status: DC | PRN

## 2019-01-04 MED ORDER — IBUPROFEN 800 MG PO TABS
800.0000 mg | ORAL_TABLET | Freq: Four times a day (QID) | ORAL | Status: DC
  Administered 2019-01-05 – 2019-01-07 (×6): 800 mg via ORAL
  Filled 2019-01-04 (×6): qty 1

## 2019-01-04 MED ORDER — FENTANYL CITRATE (PF) 100 MCG/2ML IJ SOLN: 25.0000 ug | INTRAMUSCULAR | Status: DC | PRN

## 2019-01-04 MED ORDER — DEXTROSE IN LACTATED RINGERS 5 % IV SOLN
INTRAVENOUS | Status: DC
  Administered 2019-01-04: 11:00:00 via INTRAVENOUS

## 2019-01-04 MED ORDER — DIBUCAINE 1 % RE OINT: 1.0000 "application " | TOPICAL_OINTMENT | RECTAL | Status: DC | PRN

## 2019-01-04 MED ORDER — SCOPOLAMINE 1 MG/3DAYS TD PT72
1.0000 | MEDICATED_PATCH | Freq: Once | TRANSDERMAL | Status: DC
  Administered 2019-01-04: 1.5 mg via TRANSDERMAL
  Filled 2019-01-04: qty 1

## 2019-01-04 MED ORDER — KETOROLAC TROMETHAMINE 30 MG/ML IJ SOLN
INTRAMUSCULAR | Status: DC | PRN
  Administered 2019-01-04: 30 mg via INTRAVENOUS

## 2019-01-04 MED ORDER — LACTATED RINGERS IV SOLN: INTRAVENOUS | Status: DC

## 2019-01-04 MED ORDER — PHENYLEPHRINE 8 MG IN D5W 100 ML (0.08MG/ML) PREMIX OPTIME
INJECTION | INTRAVENOUS | Status: DC | PRN
  Administered 2019-01-04: 60 ug/min via INTRAVENOUS

## 2019-01-04 MED ORDER — DIPHENHYDRAMINE HCL 25 MG PO CAPS: 25.0000 mg | ORAL_CAPSULE | Freq: Four times a day (QID) | ORAL | Status: DC | PRN

## 2019-01-04 MED ORDER — KETOROLAC TROMETHAMINE 30 MG/ML IJ SOLN
INTRAMUSCULAR | Status: AC
  Filled 2019-01-04: qty 1

## 2019-01-04 MED ORDER — MENTHOL 3 MG MT LOZG: 1.0000 | LOZENGE | OROMUCOSAL | Status: DC | PRN

## 2019-01-04 MED ORDER — SOD CITRATE-CITRIC ACID 500-334 MG/5ML PO SOLN
30.0000 mL | ORAL | Status: DC
  Filled 2019-01-04: qty 15

## 2019-01-04 MED ORDER — DEXTROSE 5 % IV SOLN
3.0000 g | INTRAVENOUS | Status: AC
  Administered 2019-01-04: 3 g via INTRAVENOUS
  Filled 2019-01-04: qty 3

## 2019-01-04 MED ORDER — SIMETHICONE 80 MG PO CHEW: 80.0000 mg | CHEWABLE_TABLET | ORAL | Status: DC | PRN

## 2019-01-04 MED ORDER — FUROSEMIDE 10 MG/ML IJ SOLN
INTRAMUSCULAR | Status: AC
  Filled 2019-01-04: qty 2

## 2019-01-04 MED ORDER — ENOXAPARIN SODIUM 80 MG/0.8ML ~~LOC~~ SOLN
0.5000 mg/kg | SUBCUTANEOUS | Status: DC
  Administered 2019-01-05 – 2019-01-07 (×3): 65 mg via SUBCUTANEOUS
  Filled 2019-01-04 (×3): qty 0.8

## 2019-01-04 MED ORDER — LACTATED RINGERS IV SOLN
INTRAVENOUS | Status: DC | PRN
  Administered 2019-01-04: 13:00:00 via INTRAVENOUS

## 2019-01-04 MED ORDER — DIPHENHYDRAMINE HCL 50 MG/ML IJ SOLN: 12.5000 mg | INTRAMUSCULAR | Status: DC | PRN

## 2019-01-04 MED ORDER — PHENYLEPHRINE HCL 10 MG/ML IJ SOLN
INTRAMUSCULAR | Status: DC | PRN
  Administered 2019-01-04: 80 ug via INTRAVENOUS

## 2019-01-04 MED ORDER — ONDANSETRON HCL 4 MG/2ML IJ SOLN
INTRAMUSCULAR | Status: DC | PRN
  Administered 2019-01-04: 4 mg via INTRAVENOUS

## 2019-01-04 SURGICAL SUPPLY — 40 items
BENZOIN TINCTURE PRP APPL 2/3 (GAUZE/BANDAGES/DRESSINGS) ×2 IMPLANT
CHLORAPREP W/TINT 26ML (MISCELLANEOUS) ×2 IMPLANT
CLAMP CORD UMBIL (MISCELLANEOUS) IMPLANT
CLOTH BEACON ORANGE TIMEOUT ST (SAFETY) ×2 IMPLANT
DRESSING PREVENA PLUS CUSTOM (GAUZE/BANDAGES/DRESSINGS) ×1 IMPLANT
DRSG OPSITE POSTOP 4X10 (GAUZE/BANDAGES/DRESSINGS) ×2 IMPLANT
DRSG PREVENA PLUS CUSTOM (GAUZE/BANDAGES/DRESSINGS) ×2
ELECT REM PT RETURN 9FT ADLT (ELECTROSURGICAL) ×2
ELECTRODE REM PT RTRN 9FT ADLT (ELECTROSURGICAL) ×1 IMPLANT
EXTENDER TRAXI PANNICULUS (MISCELLANEOUS) ×1 IMPLANT
EXTRACTOR VACUUM BELL STYLE (SUCTIONS) IMPLANT
GLOVE BIOGEL PI IND STRL 6.5 (GLOVE) ×1 IMPLANT
GLOVE BIOGEL PI IND STRL 7.0 (GLOVE) ×2 IMPLANT
GLOVE BIOGEL PI INDICATOR 6.5 (GLOVE) ×1
GLOVE BIOGEL PI INDICATOR 7.0 (GLOVE) ×2
GLOVE ORTHOPEDIC STR SZ6.5 (GLOVE) ×2 IMPLANT
GOWN STRL REUS W/TWL LRG LVL3 (GOWN DISPOSABLE) ×6 IMPLANT
HOVERMATT SINGLE USE (MISCELLANEOUS) ×2 IMPLANT
KIT ABG SYR 3ML LUER SLIP (SYRINGE) IMPLANT
KIT PREVENA INCISION MGT20CM45 (CANNISTER) ×2 IMPLANT
NEEDLE HYPO 22GX1.5 SAFETY (NEEDLE) ×2 IMPLANT
NEEDLE HYPO 25X1 1.5 SAFETY (NEEDLE) IMPLANT
NS IRRIG 1000ML POUR BTL (IV SOLUTION) ×2 IMPLANT
PACK C SECTION WH (CUSTOM PROCEDURE TRAY) ×2 IMPLANT
PAD OB MATERNITY 4.3X12.25 (PERSONAL CARE ITEMS) ×2 IMPLANT
PENCIL SMOKE EVAC W/HOLSTER (ELECTROSURGICAL) ×2 IMPLANT
RETRACTOR TRAXI PANNICULUS (MISCELLANEOUS) ×1 IMPLANT
STRIP CLOSURE SKIN 1/2X4 (GAUZE/BANDAGES/DRESSINGS) ×2 IMPLANT
SUT MON AB 4-0 PS1 27 (SUTURE) ×2 IMPLANT
SUT PLAIN 2 0 (SUTURE) ×1
SUT PLAIN ABS 2-0 CT1 27XMFL (SUTURE) ×1 IMPLANT
SUT VIC AB 0 CT1 36 (SUTURE) ×4 IMPLANT
SUT VIC AB 0 CTX 36 (SUTURE) ×1
SUT VIC AB 0 CTX36XBRD ANBCTRL (SUTURE) ×1 IMPLANT
SYR CONTROL 10ML LL (SYRINGE) ×2 IMPLANT
TOWEL OR 17X24 6PK STRL BLUE (TOWEL DISPOSABLE) ×2 IMPLANT
TRAXI PANNICULUS EXTENDER (MISCELLANEOUS) ×1
TRAXI PANNICULUS RETRACTOR (MISCELLANEOUS) ×1
TRAY FOLEY W/BAG SLVR 14FR LF (SET/KITS/TRAYS/PACK) ×2 IMPLANT
WATER STERILE IRR 1000ML POUR (IV SOLUTION) ×2 IMPLANT

## 2019-01-04 NOTE — Discharge Summary (Signed)
Obstetrics Discharge Summary [facility]   Patient Name: Alexandra Gray DOB: 1991/04/14 MRN: [id number]  Date of admission: 01/04/2019 Delivering MD: Hermina Staggers   Date of discharge: 01/07/2019  Admitting diagnosis: RCS Intrauterine pregnancy: [redacted]w[redacted]d     Secondary diagnosis:   Active Problems:   MDD (major depressive disorder), recurrent severe, without psychosis (HCC)   History of cesarean delivery affecting pregnancy   BMI 50.0-59.9, adult (HCC)   Obesity in pregnancy   Chronic hypertension during pregnancy, antepartum   GDM, class A2   Cluster B personality disorder (HCC)   GBS (group B Streptococcus carrier), +RV culture, currently pregnant   H/O: C-section   Cesarean delivery delivered   Discharge diagnosis: Term Pregnancy Delivered, CHTN and GDM A2                                            Postpartum procedures: None  Complications: none  Outpatient Follow-Up: [ ]  mood check [ ]  BP check [ ]  2-hr GTT [ ]  IUD or Nexplanon insertion   Hospital course: Alexandra Gray is a 28 y.o. [redacted]w[redacted]d who was admitted for scheduled repeat Cesarean section. Her pregnancy was complicated by above noted. Delivery was uncomplicated. Please see delivery/op note for additional details. Her postpartum course was uncomplicated. She was breastfeeding without difficulty. By day of discharge, she was passing flatus, urinating, eating and drinking without difficulty. Her pain was well-controlled, and she was discharged home with tylenol and ibuprofen. She will follow-up in clinic in 4 weeks.   Physical exam  Vitals:   01/06/19 1416 01/06/19 1432 01/06/19 2138 01/07/19 0545  BP: 135/82  (!) 140/91 118/70  Pulse: 70  67 69  Resp: 16  16 18   Temp: 99.1 F (37.3 C)  98.4 F (36.9 C) 98.9 F (37.2 C)  TempSrc: Oral  Oral Oral  SpO2:  98% 99% 99%  Weight:      Height:       General: alert, oriented, NAD Lochia: appropriate Uterine Fundus: firm Incision: Healing well with  no significant drainage, No significant erythema DVT Evaluation: No significant calf/ankle edema.  Labs: Lab Results  Component Value Date   WBC 10.8 (H) 01/05/2019   HGB 11.6 (L) 01/05/2019   HCT 36.3 01/05/2019   MCV 81.8 01/05/2019   PLT 167 01/05/2019   CMP Latest Ref Rng & Units 01/04/2019  Glucose 70 - 99 mg/dL -  BUN 6 - 20 mg/dL -  Creatinine 2.42 - 6.83 mg/dL 4.19  Sodium 622 - 297 mmol/L -  Potassium 3.5 - 5.1 mmol/L -  Chloride 98 - 111 mmol/L -  CO2 22 - 32 mmol/L -  Calcium 8.9 - 10.3 mg/dL -  Total Protein 6.5 - 8.1 g/dL -  Total Bilirubin 0.3 - 1.2 mg/dL -  Alkaline Phos 38 - 989 U/L -  AST 15 - 41 U/L -  ALT 0 - 44 U/L -    Discharge instructions: Per After Visit Summary and "Baby and Me Booklet"  After visit meds:  Allergies as of 01/07/2019      Reactions   Nubain [nalbuphine Hcl] Other (See Comments)   Makes patient hot      Medication List    TAKE these medications   acetaminophen 325 MG tablet Commonly known as:  TYLENOL Take 2 tablets (650 mg total) by mouth every 6 (six) hours  as needed for mild pain or moderate pain.   ibuprofen 800 MG tablet Commonly known as:  ADVIL,MOTRIN Take 1 tablet (800 mg total) by mouth every 6 (six) hours.   multivitamin-prenatal 27-0.8 MG Tabs tablet Take 1 tablet by mouth daily.   oxyCODONE 5 MG immediate release tablet Commonly known as:  Oxy IR/ROXICODONE Take 1 tablet (5 mg total) by mouth every 6 (six) hours as needed for up to 5 days for moderate pain.   senna-docusate 8.6-50 MG tablet Commonly known as:  Senokot-S Take 2 tablets by mouth daily. Start taking on:  January 08, 2019   sertraline 50 MG tablet Commonly known as:  ZOLOFT Take 1 tablet (50 mg total) by mouth daily.      Postpartum contraception: Nexplanon and IUD   Diet: Routine Diet Activity: Advance as tolerated. Pelvic rest for 6 weeks.   Follow-up Appt: Future Appointments  Date Time Provider Department Center  01/22/2019   9:00 AM [facility] NURSE [facility]  02/11/2019  2:15 PM Tamera Stands, DO [facility]   Please schedule this patient for Postpartum visit in: 4 weeks with the following provider: Any provider For C/S patients schedule nurse incision check in weeks 2 weeks: yes High risk pregnancy complicated by: A2GDM, cHTN, depression Delivery mode:  SVD Anticipated Birth Control:  IUD or Nexplanon PP Procedures needed: BP check, incision check, 2-hr GTT, mood check  Schedule Integrated BH visit: yes  Newborn Data: Live born female  Birth Weight:   APGAR: 8, 8  Newborn Delivery   Birth date/time:  01/04/2019 13:27:00 Delivery type:  C-Section, Forcep Assisted Trial of labor:  No C-section categorization:  Repeat    Baby Feeding: Bottle and Breast Disposition:home with mother  Wilnette Kales, DO  PGY-1  Attestation: I have seen this patient and agree with the resident's documentation. I have examined them separately, and we have discussed the plan of care.  Cristal Deer. Earlene Plater, DO OB/GYN Fellow

## 2019-01-04 NOTE — Anesthesia Procedure Notes (Signed)
Spinal  Patient location during procedure: OR Start time: 01/04/2019 12:45 PM End time: 01/04/2019 12:50 PM Staffing Anesthesiologist: Mal Amabile, MD Performed: anesthesiologist  Preanesthetic Checklist Completed: patient identified, site marked, surgical consent, pre-op evaluation, timeout performed, IV checked, risks and benefits discussed and monitors and equipment checked Spinal Block Patient position: sitting Prep: site prepped and draped and DuraPrep Patient monitoring: heart rate, cardiac monitor, continuous pulse ox and blood pressure Approach: midline Location: L3-4 Injection technique: single-shot Needle Needle type: Pencan  Needle gauge: 24 G Needle length: 9 cm Needle insertion depth: 8 cm Assessment Sensory level: T3 Additional Notes Patient tolerated procedure well. Adequate sensory level.

## 2019-01-04 NOTE — H&P (Signed)
OBSTETRIC ADMISSION HISTORY AND PHYSICAL  [name] is a 28 y.o. female G2P1001 with IUP at 3620w0d by L/7 presenting for scheduled repeat Cesarean section.   Reports fetal movement. Denies vaginal bleeding, leakage of fluids.   [facility]  Support person in labor: Andrey CampanileCarlyon (mother)  Ultrasounds . 7w3: dating U/S . 19w6: anatomy U/S wnl, EFW 56%, anterior placenta . 24w6: EFW 56%, completion of fetal anatomy, limited views of spine . 29w2: EFW 56%, 1411g . 33w6: EFW 2602g (80%)  Prenatal History/Complications: . cHTN - no medications . A2GDM - insulin NPH 12U qam and 16U qhs, aspirin  . Morbid obesity, BMI 56 . History of Depression, Cluster B Personality traits  . Prior Cesarean - desired repeat   Past Medical History: Past Medical History:  Diagnosis Date  . Chlamydia   . Depression    doing ok now  . Gestational diabetes   . Gonorrhea   . History of pregnancy induced hypertension 12/06/2016  . Hx of trichomoniasis   . Hypertension    sometimes is up, never on meds  . Mental disorder   . Ovarian cyst   . PID (pelvic inflammatory disease)   . Sleep apnea     Past Surgical History: Past Surgical History:  Procedure Laterality Date  . CESAREAN SECTION N/A 10/03/2015   Procedure: CESAREAN SECTION;  Surgeon: Tilda BurrowJohn Ferguson V, MD;  Location: [facility];  Service: Obstetrics;  Laterality: N/A;  . KNEE SURGERY      Obstetrical History: OB History    Gravida  2   Para  1   Term  1   Preterm      AB      Living  1     SAB      TAB      Ectopic      Multiple  0   Live Births  1           Social History: Social History   Socioeconomic History  . Marital status: Single    Spouse name: Not on file  . Number of children: 1  . Years of education: 819  . Highest education level: Not on file  Occupational History  . Occupation: disabled  Social Needs  . Financial resource strain: Not hard at all  . Food insecurity:     Worry: Never true    Inability: Never true  . Transportation needs:    Medical: No    Non-medical: No  Tobacco Use  . Smoking status: Never Smoker  . Smokeless tobacco: Never Used  Substance and Sexual Activity  . Alcohol use: No  . Drug use: No  . Sexual activity: Not Currently    Birth control/protection: None  Lifestyle  . Physical activity:    Days per week: Not on file    Minutes per session: Not on file  . Stress: Not at all  Relationships  . Social connections:    Talks on phone: Not on file    Gets together: Not on file    Attends religious service: Not on file    Active member of club or organization: Not on file    Attends meetings of clubs or organizations: Not on file    Relationship status: Not on file  Other Topics Concern  . Not on file  Social History Narrative   Lives alone in a one story home.  Has one child.     On disability for depression  and PTSD.     Education: 9th grade.    She is in school for [employer].      Family History: Family History  Problem Relation Age of Onset  . Healthy Mother   . Healthy Father   . Healthy Brother   . Cancer Neg Hx   . Diabetes Neg Hx   . Heart disease Neg Hx   . Hypertension Neg Hx   . Stroke Neg Hx   . Hearing loss Neg Hx     Allergies: Allergies  Allergen Reactions  . Nubain [Nalbuphine Hcl] Other (See Comments)    Makes patient hot    Medications Prior to Admission  Medication Sig Dispense Refill Last Dose  . aspirin EC 81 MG tablet Take 1 tablet (81 mg total) by mouth daily. 90 tablet 3 01/03/2019 at Unknown time  . insulin NPH Human (HUMULIN N,NOVOLIN N) 100 UNIT/ML injection Inject into the skin 12 units with breakfast  and Inject 16 units into the skin at bedtime 10 mL 3 01/03/2019 at Unknown time  . Prenatal Vit-Fe Fumarate-FA (MULTIVITAMIN-PRENATAL) 27-0.8 MG TABS tablet Take 1 tablet by mouth daily.    01/03/2019 at Unknown time  . sertraline (ZOLOFT) 50 MG tablet Take 1 tablet (50 mg total) by mouth  daily. 90 tablet 3 01/03/2019 at Unknown time  . ACCU-CHEK FASTCLIX LANCETS MISC 1 Device by Percutaneous route 4 (four) times daily. 100 each 12 Taking  . butalbital-acetaminophen-caffeine (FIORICET, ESGIC) 50-325-40 MG tablet Take 1-2 tablets by mouth every 6 (six) hours as needed for headache. (Patient not taking: Reported on 01/02/2019) 20 tablet 2 More than a month at Unknown time  . glucose blood (ACCU-CHEK GUIDE) test strip Use as instructed QID 100 each 12 Taking  . promethazine (PHENERGAN) 25 MG tablet Take 1 tablet (25 mg total) by mouth every 6 (six) hours as needed for nausea or vomiting. (Patient not taking: Reported on 12/23/2018) 30 tablet 1 Not Taking     Review of Systems  All systems reviewed and negative except as stated in HPI  Blood pressure 120/71, pulse 89, temperature 98.8 F (37.1 C), temperature source Oral, resp. rate 20, height 5\' 1"  (1.549 m), weight 134.7 kg, last menstrual period 05/10/2018, currently breastfeeding. General appearance: alert, well-appearing, NAD Lungs: no respiratory distress Heart: regular rate  Abdomen: soft, non-tender; gravid  Pelvic: deferred Extremities: SCDs in place, no obvious LE edema   Prenatal labs: ABO, Rh: --/--/A POS (02/06 1014) Antibody: NEG (02/06 1014) Rubella: 1.65 (08/01 1350) RPR: Non Reactive (02/06 1014)  HBsAg: Negative (08/01 1350)  HIV: Non Reactive (12/02 1720)  GBS: Positive (01/23 1708)  Glucola: positive Genetic screening:  Low risk female   Prenatal Transfer Tool  Maternal Diabetes: Yes:  Diabetes Type:  Insulin/Medication controlled Genetic Screening: Normal Maternal Ultrasounds/Referrals: Normal Fetal Ultrasounds or other Referrals: planned for fetal ECHO but not done Maternal Substance Abuse: denies Significant Maternal Medications:  PNV, phenergan, insulin, aspirin, stopped psych meds for pregnancy  Significant Maternal Lab Results: as above  No results found for this or any previous visit (from the  past 24 hour(s)).  Patient Active Problem List   Diagnosis Date Noted  . GBS (group B Streptococcus carrier), +RV culture, currently pregnant 12/23/2018  . GDM, class A2 08/12/2018  . Chronic hypertension during pregnancy, antepartum 07/30/2018  . Obesity in pregnancy 06/27/2018  . BMI 50.0-59.9, adult (HCC) 05/28/2018  . History of cesarean delivery affecting pregnancy 10/03/2015  . Supervision of high risk pregnancy, antepartum  08/23/2015  . Severe major depression without psychotic features (HCC) 02/18/2014  . 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  . PTSD (post-traumatic stress disorder) 02/16/2014  . Sleep apnea 02/16/2014  . MDD (major depressive disorder), recurrent severe, 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    Assessment/Plan:  Carvel GettingCierra R Alexandra Gray is a 28 y.o. G2P1001 at 6369w0d here for scheduled repeat Cesarean section.   The risks of cesarean section discussed with the patient included but were not limited to: bleeding which may require transfusion or reoperation; infection which may require antibiotics; injury to bowel, bladder, ureters or other surrounding organs; injury to the fetus; need for additional procedures including hysterectomy in the event of a life-threatening hemorrhage; placental abnormalities wth subsequent pregnancies, incisional problems, thromboembolic phenomenon and other postoperative/anesthesia complications. The patient concurred with the proposed plan, giving informed written consent for the procedure.   Patient has been NPO since yesterday evening she will remain NPO for procedure. Anesthesia and OR aware. Preoperative prophylactic antibiotics and SCDs ordered on call to the OR.  To OR when ready.  Fetal Wellbeing: EFW >80% by Leopold's.  -- GBS (positive)   Postpartum Planning -- breast/IUD -- RI/[x] Tdap/[x] flu  Briseis Aguilera S. Earlene PlaterWallace, DO OB/GYN Fellow

## 2019-01-04 NOTE — Progress Notes (Signed)
1448 CBG 70

## 2019-01-04 NOTE — Lactation Note (Signed)
This note was copied from a baby's chart. Lactation Consultation Note  Patient Name: Alexandra Gray Date: 01/04/2019  Attempt to do a lactation visit with mom.  Both mom and baby asleep.   Maternal Data    Feeding Feeding Type: Bottle Fed - Formula Nipple Type: Regular  LATCH Score                   Interventions    Lactation Tools Discussed/Used     Consult Status      Deijah Spikes Michaelle Copas 01/04/2019, 8:46 PM

## 2019-01-04 NOTE — Op Note (Signed)
[name] PROCEDURE DATE: 01/04/2019  PREOPERATIVE DIAGNOSES: Intrauterine pregnancy at [redacted]w[redacted]d weeks gestation; history of prior cesarean delivery  POSTOPERATIVE DIAGNOSES: The same  PROCEDURE: Repeat Low Transverse Cesarean Section  SURGEON:  Dr. Nettie Elm  ASSISTANT:  Dr. Rhett Bannister   ANESTHESIOLOGY TEAM: Anesthesiologist: Mal Amabile, MD CRNA: Angela Adam, CRNA  INDICATIONS: Alexandra Gray is a 28 y.o. [phone] at [redacted]w[redacted]d here for cesarean section secondary to the indications listed under preoperative diagnoses; please see preoperative note for further details.  The risks of cesarean section were discussed with the patient including but were not limited to: bleeding which may require transfusion or reoperation; infection which may require antibiotics; injury to bowel, bladder, ureters or other surrounding organs; injury to the fetus; need for additional procedures including hysterectomy in the event of a life-threatening hemorrhage; placental abnormalities wth subsequent pregnancies, incisional problems, thromboembolic phenomenon and other postoperative/anesthesia complications.   The patient concurred with the proposed plan, giving informed written consent for the procedure.    FINDINGS:  Viable female infant in cephalic presentation.  Apgars 8 and 8.  Clear amniotic fluid.  Intact placenta, three vessel cord.  Normal uterus, fallopian tubes and ovaries bilaterally.  ANESTHESIA: Spinal INTRAVENOUS FLUIDS: 2500 ml   ESTIMATED BLOOD LOSS: 148 ml QBL from Triton URINE OUTPUT:  250 ml SPECIMENS: Placenta sent to L&D COMPLICATIONS: None immediate  PROCEDURE IN DETAIL:  The patient preoperatively received intravenous antibiotics and had sequential compression devices applied to her lower extremities.  She was then taken to the operating room where spinal anesthesia was administered and was found to be adequate. She was then placed in a dorsal supine position with a  leftward tilt, and prepped and draped in a sterile manner.  A foley catheter was placed into her bladder and attached to constant gravity.  After an adequate timeout was performed, a Pfannenstiel skin incision was made with scalpel on her preexisting scar and carried through to the underlying layer of fascia. The fascia was incised in the midline, and this incision was extended bilaterally using the Mayo scissors.  Kocher clamps were applied to the superior aspect of the fascial incision and the underlying rectus muscles were dissected off bluntly.  A similar process was carried out on the inferior aspect of the fascial incision. The rectus muscles were separated in the midline and the peritoneum was entered bluntly. The Alexis self-retaining retractor was introduced into the abdominal cavity. A bladder was created and bladder blade was inserted. Attention was turned to the lower uterine segment where a low transverse hysterotomy was made with a scalpel and extended bilaterally bluntly.  The infant was successfully delivered with the assistance of one forcep for leverage, the cord was clamped and cut after one minute, and the infant was handed over to the awaiting neonatology team. Uterine massage was then administered, and the placenta delivered intact with a three-vessel cord. The uterus was then cleared of clots and debris and exteriorized  The hysterotomy was closed with 0 Chromic in a running locked fashion, the uterus was returned to the abdomen, and an imbricating layer was also placed with 0 Chromic.   The pelvis was cleared of all clot and debris. Hemostasis was confirmed on all surfaces.  The retractor was removed.  The peritoneum was closed with a 2-0 Vicryl running stitch and the rectus muscles were reapproximated. The fascia was then closed using 0 Vicryl  in a running fashion.  The subcutaneous layer was irrigated, reapproximated with 3-0  Vicryl interrupted stitches, and the skin was closed with a 4-0  Vicryl subcuticular stitch. The patient tolerated the procedure well. Sponge, instrument and needle counts were correct x 3.  She was taken to the recovery room in stable condition.   Alexandra DeerLaurel S. Earlene PlaterWallace, DO OB/GYN Fellow

## 2019-01-04 NOTE — Progress Notes (Signed)
FHR doppler at 1037 am from [address] [place], [place] 10:38 AM 01/04/2019

## 2019-01-04 NOTE — Transfer of Care (Signed)
Immediate Anesthesia Transfer of Care Note  Patient: Alexandra Gray  Procedure(s) Performed: CESAREAN SECTION (N/A Abdomen)  Patient Location: PACU  Anesthesia Type:Spinal  Level of Consciousness: awake and alert   Airway & Oxygen Therapy: Patient Spontanous Breathing  Post-op Assessment: Report given to RN and Post -op Vital signs reviewed and stable  Post vital signs: Reviewed  Last Vitals:  Vitals Value Taken Time  BP    Temp    Pulse    Resp    SpO2      Last Pain:  Vitals:   01/04/19 0959  TempSrc:   PainSc: 0-No pain         Complications: No apparent anesthesia complications

## 2019-01-04 NOTE — Anesthesia Postprocedure Evaluation (Signed)
Anesthesia Post Note  Patient: Alexandra Gray  Procedure(s) Performed: CESAREAN SECTION (N/A Abdomen)     Patient location during evaluation: PACU Anesthesia Type: Spinal Level of consciousness: oriented and awake and alert Pain management: pain level controlled Vital Signs Assessment: post-procedure vital signs reviewed and stable Respiratory status: spontaneous breathing, respiratory function stable and nonlabored ventilation Cardiovascular status: blood pressure returned to baseline and stable Postop Assessment: no headache, no backache, no apparent nausea or vomiting, spinal receding and patient able to bend at knees Anesthetic complications: no    Last Vitals:  Vitals:   01/04/19 1530 01/04/19 1541  BP: 131/72   Pulse: 60   Resp: 17   Temp:    SpO2: 96% 100%    Last Pain:  Vitals:   01/04/19 1541  TempSrc:   PainSc: 0-No pain   Pain Goal:                   Charliee Krenz A.

## 2019-01-05 ENCOUNTER — Encounter (HOSPITAL_COMMUNITY): Payer: Self-pay | Admitting: Obstetrics and Gynecology

## 2019-01-05 LAB — CBC
HCT: 36.3 % (ref 36.0–46.0)
HEMOGLOBIN: 11.6 g/dL — AB (ref 12.0–15.0)
MCH: 26.1 pg (ref 26.0–34.0)
MCHC: 32 g/dL (ref 30.0–36.0)
MCV: 81.8 fL (ref 80.0–100.0)
Platelets: 167 10*3/uL (ref 150–400)
RBC: 4.44 MIL/uL (ref 3.87–5.11)
RDW: 15.4 % (ref 11.5–15.5)
WBC: 10.8 10*3/uL — AB (ref 4.0–10.5)
nRBC: 0 % (ref 0.0–0.2)

## 2019-01-05 LAB — BIRTH TISSUE RECOVERY COLLECTION (PLACENTA DONATION)

## 2019-01-05 MED ORDER — SODIUM CHLORIDE 0.9 % IV BOLUS: 1000.0000 mL | Freq: Once | INTRAVENOUS | Status: DC

## 2019-01-05 MED ORDER — LACTATED RINGERS IV BOLUS
1000.0000 mL | Freq: Once | INTRAVENOUS | Status: AC
  Administered 2019-01-05: 1000 mL via INTRAVENOUS

## 2019-01-05 NOTE — Anesthesia Postprocedure Evaluation (Signed)
Anesthesia Post Note  Patient: Alexandra Gray  Procedure(s) Performed: CESAREAN SECTION (N/A Abdomen)     Patient location during evaluation: Mother Baby Anesthesia Type: Spinal Level of consciousness: awake Pain management: satisfactory to patient Vital Signs Assessment: post-procedure vital signs reviewed and stable Respiratory status: spontaneous breathing Cardiovascular status: stable Anesthetic complications: no    Last Vitals:  Vitals:   01/05/19 1058 01/05/19 1350  BP: (!) 114/57 (!) 131/59  Pulse: 73 70  Resp: 18 18  Temp: 37.3 C 36.7 C  SpO2: 100%     Last Pain:  Vitals:   01/05/19 1545  TempSrc:   PainSc: 4    Pain Goal:                   [employer]

## 2019-01-05 NOTE — Lactation Note (Signed)
This note was copied from a baby's chart. Lactation Consultation Note  Patient Name: [name]'UToday's Date: 01/05/2019  P2, 16 hour female infant. Per mom, she doesn't want latch infant to breast at this time but she does want to breastfeed her baby she interested in pumping. LC gave mom a hand pump and explained how to use, assemble, reassemble, clean and store. Mom plan is to use DEBP in hospital and she will pump every 3 hours on initial setting. Mom feels that pumping is good for her she is busy at home with her 28 year old and by pumping infant is getting some of her milk. [facility] if she decides to latch infant to breast. Mom knows to feed infant according hunger cues and not exceed 3 hours without feeding infant. LC discussed I & O. Mom made aware of O/P services, breastfeeding support groups, community resources, and our phone # for post-discharge questions.   Maternal Data    Feeding Feeding Type: Bottle Fed - Formula Nipple Type: Slow - flow  LATCH Score                   Interventions    Lactation Tools Discussed/Used     Consult Status      Danelle EarthlyRobin Rolande Moe 01/05/2019, 5:36 AM

## 2019-01-05 NOTE — Addendum Note (Signed)
Addendum  created 01/05/19 1800 by Algis Greenhouse, CRNA   Clinical Note Signed

## 2019-01-05 NOTE — Progress Notes (Signed)
MOB was referred for history of depression/anxiety. * Referral screened out by Clinical Social Worker because none of the following criteria appear to apply: ~ History of anxiety/depression during this pregnancy, or of post-partum depression following prior delivery. ~ Diagnosis of anxiety and/or depression within last 3 years OR * MOB's symptoms currently being treated with medication and/or therapy. MOB currently has an active Rx for Zolft and will follow-up with outpatient integrated care specialist. Please contact the Clinical Social Worker if needs arise, by [facility] request, or if MOB scores greater than 9/yes to question 10 on [place] Postpartum Depression Screen. Blaine Hamper, MSW, LCSW Clinical Social Work [phone]

## 2019-01-05 NOTE — Progress Notes (Signed)
Postpartum Progress Note  Subjective: Doing well, starting to feel more pain in incision state. Denies headaches, vision changes, shortness of breath. Going to try to get up and move around more.   Objective: Blood pressure 116/67, pulse 70, temperature 98.3 F (36.8 C), temperature source Oral, resp. rate 18, height 5\' 1"  (1.549 m), weight 134.7 kg, last menstrual period 05/10/2018, SpO2 100 %, unknown if currently breastfeeding.  Physical Exam:  General: alert, well-appearing Lochia: appropriate Uterine Fundus: firm but tender  Incision: Prevena in place  no drainage  DVT Evaluation: SCDs in place   Recent Labs    01/04/19 2020 01/05/19 0649  HGB 12.9 11.6*  HCT 40.0 36.3   Assessment/Plan: Doing well, pain relatively well-controlled Prevena in place and functioning normally BP well-controlled, asymptomatic HgB stable 12.9>11.6    LOS: 1 day   Alexandra Gray 01/05/2019, 7:31 AM

## 2019-01-05 NOTE — Lactation Note (Signed)
This note was copied from a baby's chart. Lactation Consultation Note  Patient Name: Boy Angela NevinCierra Meir ZOXWR'UToday's Date: 01/05/2019      [facility]:  Mother is pumping and bottle feeding only; she does not need our services.  RN updated.                 Reeves Musick R Quanah Majka 01/05/2019, 12:23 PM

## 2019-01-06 LAB — GLUCOSE, CAPILLARY: Glucose-Capillary: 70 mg/dL (ref 70–99)

## 2019-01-06 NOTE — Progress Notes (Addendum)
Daily Post Partum Note  01/06/2019 Alexandra Gray is a 28 y.o. [id number]  POD#2 s/p scheduled rLTCS @ 7243w0d (150mL)  Pregnancy c/b BMI 50s, cHTN, GDMa2 (insulin), depression w/ h/o SI, sleep apnea 24hr/overnight events:  none  Subjective:  +flatus and patient having incision pain, no problems with PO, +some ambulation  Objective:    Current Vital Signs 24h Vital Sign Ranges  T 98.3 F (36.8 C) Temp  Avg: 98.2 F (36.8 C)  Min: 98.1 F (36.7 C)  Max: 98.3 F (36.8 C)  BP (!) 109/96 BP  Min: 109/96  Max: 131/59  HR 64 Pulse  Avg: 67  Min: 64  Max: 70  RR 18 Resp  Avg: 18  Min: 18  Max: 18  SaO2 100 % Room Air No data recorded       24 Hour I/O Current Shift I/O  Time Ins Outs 02/09 0701 - 02/10 0700 In: 240  Out: 850 [Urine:850] No intake/output data recorded.    General: NAD Abdomen: obese, soft, nttp, prevena in place and light is green, +BS Perineum: deferred Skin:  Warm and dry.  Cardiovascular: S1, S2 normal, no murmur, rub or gallop, regular rate and rhythm Respiratory:  Clear to auscultation bilateral. Normal respiratory effort  Medications Current Facility-Administered Medications  Medication Dose Route Frequency Provider Last Rate Last Dose  . acetaminophen (TYLENOL) tablet 650 mg  650 mg Oral Q6H PRN Kellogg, Alexandra L, DO   650 mg at 01/06/19 [id number]  . coconut oil  1 application Topical PRN Earlene PlaterWallace, Laurel S, DO      . witch hazel-glycerin (TUCKS) pad 1 application  1 application Topical PRN Earlene PlaterWallace, Laurel S, DO       And  . dibucaine (NUPERCAINAL) 1 % rectal ointment 1 application  1 application Rectal PRN Earlene PlaterWallace, Laurel S, DO      . diphenhydrAMINE (BENADRYL) injection 12.5 mg  12.5 mg Intravenous Q4H PRN Mal AmabileFoster, Michael, MD       Or  . diphenhydrAMINE (BENADRYL) capsule 25 mg  25 mg Oral Q4H PRN Mal AmabileFoster, Michael, MD      . diphenhydrAMINE (BENADRYL) capsule 25 mg  25 mg Oral Q6H PRN Earlene PlaterWallace, Laurel S, DO      . enoxaparin (LOVENOX) injection 65 mg  0.5  mg/kg Subcutaneous Q24H Wallace, Laurel S, DO   65 mg at 01/06/19 1028  . ibuprofen (ADVIL,MOTRIN) tablet 800 mg  800 mg Oral Q6H Wallace, Laurel S, DO   800 mg at 01/06/19 0510  . lactated ringers infusion   Intravenous Continuous Tamera StandsWallace, Laurel S, DO 125 mL/hr at 01/05/19 [id number]    . menthol-cetylpyridinium (CEPACOL) lozenge 3 mg  1 lozenge Oral Q2H PRN Earlene PlaterWallace, Laurel S, DO      . naloxone [facility](NARCAN) injection 0.4 mg  0.4 mg Intravenous PRN Mal AmabileFoster, Michael, MD       And  . sodium chloride flush (NS) 0.9 % injection 3 mL  3 mL Intravenous PRN Mal AmabileFoster, Michael, MD      . naloxone HCl [facility](NARCAN) 2 mg in dextrose 5 % 250 mL infusion  1-4 mcg/kg/hr Intravenous Continuous PRN Mal AmabileFoster, Michael, MD      . ondansetron [facility](ZOFRAN) injection 4 mg  4 mg Intravenous Q8H PRN Mal AmabileFoster, Michael, MD      . oxyCODONE (Oxy IR/ROXICODONE) immediate release tablet 5-10 mg  5-10 mg Oral Q4H PRN Rhett BannisterWallace, Laurel S, DO   5 mg at 01/06/19 [id number]  . prenatal multivitamin tablet 1 tablet  1 tablet  Oral Q1200 Rhett Bannister S, DO   1 tablet at 01/05/19 1210  . senna-docusate (Senokot-S) tablet 2 tablet  2 tablet Oral Q24H Rhett Bannister S, DO   2 tablet at 01/05/19 2328  . sertraline (ZOLOFT) tablet 50 mg  50 mg Oral Daily Wallace, Laurel S, DO   50 mg at 01/06/19 1026  . simethicone (MYLICON) chewable tablet 80 mg  80 mg Oral TID PC Wallace, Laurel S, DO   80 mg at 01/06/19 1028  . simethicone (MYLICON) chewable tablet 80 mg  80 mg Oral Q24H Wallace, Laurel S, DO   80 mg at 01/05/19 2330  . simethicone (MYLICON) chewable tablet 80 mg  80 mg Oral PRN Earlene Plater, Laurel S, DO      . sodium chloride 0.9 % bolus 1,000 mL  1,000 mL Intravenous Once Kellogg, Alexandra L, DO      . Tdap (BOOSTRIX) injection 0.5 mL  0.5 mL Intramuscular Once Wallace, Laurel S, DO      . zolpidem (AMBIEN) tablet 5 mg  5 mg Oral QHS PRN Tamera Stands, DO        Labs:  Recent Labs  Lab 01/02/19 1014 01/04/19 2020 01/05/19 0649  WBC 9.3 12.2* 10.8*   HGB 13.0 12.9 11.6*  HCT 39.1 40.0 36.3  PLT 213 185 167   Recent Labs  Lab 01/04/19 2020  CREATININE 0.61    Assessment & Plan:  Pt stable *Postpartum/postop: routine care. A pos. Follow up re: [facility] tomorrow.  *Psych: needs 7-10d mood check. Signed off by SW. Pt stopped zoloft during pregnancy *BMI 50s: pt not moving much. Consider 2-3wks of PP ppx lovenox instead of just while inpatient *cHTN: doing well on no meds *GDMa2: no BS values pp. Check AM fasting tomorrow.  *Dispo: likely tomorrow. Will need 1wk mood check and prevena removal visit   Cornelia Copa MD Attending Center for [facility])

## 2019-01-07 LAB — GLUCOSE, CAPILLARY
Glucose-Capillary: 63 mg/dL — ABNORMAL LOW (ref 70–99)
Glucose-Capillary: 88 mg/dL (ref 70–99)

## 2019-01-07 MED ORDER — OXYCODONE HCL 5 MG PO TABS: 5.0000 mg | ORAL_TABLET | Freq: Four times a day (QID) | ORAL | 0 refills | Status: AC | PRN

## 2019-01-07 MED ORDER — ACETAMINOPHEN 325 MG PO TABS: 650.0000 mg | ORAL_TABLET | Freq: Four times a day (QID) | ORAL | 0 refills | Status: DC | PRN

## 2019-01-07 MED ORDER — SENNOSIDES-DOCUSATE SODIUM 8.6-50 MG PO TABS: 2.0000 | ORAL_TABLET | ORAL | 0 refills | Status: DC

## 2019-01-07 MED ORDER — IBUPROFEN 800 MG PO TABS: 800.0000 mg | ORAL_TABLET | Freq: Four times a day (QID) | ORAL | 0 refills | Status: DC

## 2019-01-07 NOTE — Progress Notes (Signed)
Pt states she is not passing gas. Pt strongly encouraged to ambulate in hallway instead of inside pt room. Pt OOB and walked around nurses station once while nurse assessing baby in room.

## 2019-01-07 NOTE — Progress Notes (Signed)
Pt given 4oz juice for CBG 63. Will recheck in 15 mins

## 2019-01-13 ENCOUNTER — Other Ambulatory Visit: Payer: Medicaid Other

## 2019-01-13 DIAGNOSIS — O24429 Gestational diabetes mellitus in childbirth, unspecified control: Secondary | ICD-10-CM

## 2019-01-15 LAB — GLUCOSE TOLERANCE, 2 HOURS: Glucose, GTT - Fasting: 77 mg/dL (ref 65–99)

## 2019-01-22 ENCOUNTER — Ambulatory Visit (INDEPENDENT_AMBULATORY_CARE_PROVIDER_SITE_OTHER): Payer: Medicaid Other

## 2019-01-22 VITALS — BP 116/83 | HR 87 | Wt 269.9 lb

## 2019-01-22 DIAGNOSIS — Z5189 Encounter for other specified aftercare: Secondary | ICD-10-CM

## 2019-01-22 NOTE — Progress Notes (Signed)
Pt here today for wound check s/p c-section on 01/04/19.  Pt denies any pain or bleeding at this time.  Incision well approximated, no draingage, no odor.  Pt advised to continue to monitor for s/sx of infection.  Pt informed that her pp visit was scheduled for 02/11/19 and to call the office if she has any questions or concerns.  Pt verbalized understanding.

## 2019-01-22 NOTE — Progress Notes (Signed)
I have reviewed the chart and agree with nursing staff's documentation of this patient's encounter.  Jaynie Collins, MD 01/22/2019 1:01 PM

## 2019-01-27 ENCOUNTER — Encounter (HOSPITAL_COMMUNITY): Payer: Self-pay

## 2019-01-27 ENCOUNTER — Other Ambulatory Visit: Payer: Self-pay

## 2019-01-27 ENCOUNTER — Inpatient Hospital Stay (HOSPITAL_COMMUNITY)
Admission: EM | Admit: 2019-01-27 | Discharge: 2019-01-27 | Disposition: A | Discharge status: Home / Self Care | Payer: Medicaid Other | Attending: Obstetrics and Gynecology | Admitting: Obstetrics and Gynecology

## 2019-01-27 DIAGNOSIS — O9089 Other complications of the puerperium, not elsewhere classified: Secondary | ICD-10-CM | POA: Diagnosis not present

## 2019-01-27 DIAGNOSIS — R109 Unspecified abdominal pain: Secondary | ICD-10-CM | POA: Insufficient documentation

## 2019-01-27 DIAGNOSIS — L7682 Other postprocedural complications of skin and subcutaneous tissue: Secondary | ICD-10-CM

## 2019-01-27 LAB — URINALYSIS, ROUTINE W REFLEX MICROSCOPIC
Bilirubin Urine: NEGATIVE
Glucose, UA: NEGATIVE mg/dL
Hgb urine dipstick: NEGATIVE
Ketones, ur: NEGATIVE mg/dL
NITRITE: NEGATIVE
Protein, ur: NEGATIVE mg/dL
Specific Gravity, Urine: 1.012 (ref 1.005–1.030)
pH: 6 (ref 5.0–8.0)

## 2019-01-27 LAB — CBC WITH DIFFERENTIAL/PLATELET
Abs Immature Granulocytes: 0.07 10*3/uL (ref 0.00–0.07)
Basophils Absolute: 0 10*3/uL (ref 0.0–0.1)
Basophils Relative: 0 %
Eosinophils Absolute: 0.2 10*3/uL (ref 0.0–0.5)
Eosinophils Relative: 1 %
HCT: 44.1 % (ref 36.0–46.0)
Hemoglobin: 14.1 g/dL (ref 12.0–15.0)
Immature Granulocytes: 1 %
Lymphocytes Relative: 22 %
Lymphs Abs: 3 10*3/uL (ref 0.7–4.0)
MCH: 25.9 pg — ABNORMAL LOW (ref 26.0–34.0)
MCHC: 32 g/dL (ref 30.0–36.0)
MCV: 80.9 fL (ref 80.0–100.0)
MONOS PCT: 5 %
Monocytes Absolute: 0.7 10*3/uL (ref 0.1–1.0)
Neutro Abs: 9.8 10*3/uL — ABNORMAL HIGH (ref 1.7–7.7)
Neutrophils Relative %: 71 %
Platelets: 322 10*3/uL (ref 150–400)
RBC: 5.45 MIL/uL — ABNORMAL HIGH (ref 3.87–5.11)
RDW: 14.3 % (ref 11.5–15.5)
WBC: 13.9 10*3/uL — ABNORMAL HIGH (ref 4.0–10.5)
nRBC: 0 % (ref 0.0–0.2)

## 2019-01-27 LAB — POCT PREGNANCY, URINE: Preg Test, Ur: NEGATIVE

## 2019-01-27 MED ORDER — IBUPROFEN 800 MG PO TABS
800.0000 mg | ORAL_TABLET | Freq: Once | ORAL | Status: AC
  Administered 2019-01-27: 800 mg via ORAL
  Filled 2019-01-27: qty 1

## 2019-01-27 MED ORDER — SIMETHICONE 80 MG PO CHEW
80.0000 mg | CHEWABLE_TABLET | Freq: Once | ORAL | Status: AC
  Administered 2019-01-27: 80 mg via ORAL
  Filled 2019-01-27: qty 1

## 2019-01-27 MED ORDER — ACETAMINOPHEN 500 MG PO TABS
1000.0000 mg | ORAL_TABLET | Freq: Once | ORAL | Status: AC
  Administered 2019-01-27: 1000 mg via ORAL
  Filled 2019-01-27: qty 2

## 2019-01-27 NOTE — Discharge Instructions (Signed)
Wound Infection    A wound infection happens when germs start to grow in the wound. Germs that cause wound infections are most often bacteria. Other types of infections can occur as well. In some cases, infection can cause the wound to break open. Wound infections need treatment. If a wound infection is not treated, complications can happen.  Follow these instructions at home:  Medicines   Take or apply over-the-counter and prescription medicines only as told by your doctor.   If you were prescribed antibiotic medicine, take or apply it as told by your doctor. Do not stop using the antibiotic even if your condition improves.  Wound care   Clean the wound each day or as told by your doctor.  ? Wash the wound with mild soap and water.  ? Rinse the wound with water to remove all soap.  ? Pat the wound dry with a clean towel. Do not rub it.   Follow instructions from your doctor about how to take care of your wound. Make sure you:  ? Wash your hands with soap and water before you change your bandage (dressing). If you cannot use soap and water, use hand sanitizer.  ? Change your bandage as told by your doctor.  ? Leave stitches (sutures), skin glue, or skin tape (adhesive) strips in place if your wound has been closed. They may need to stay in place for 2 weeks or longer. If tape strips get loose and curl up, you may trim the loose edges. Do not remove tape strips completely unless your doctor says it is okay. Some wounds are left open to heal on their own.   Check your wound every day for signs of infection. Watch for:  ? More redness, swelling, or pain.  ? More fluid or blood.  ? Warmth.  ? Pus or a bad smell.  General instructions   Keep the bandage dry until your doctor says it can be removed.   Do not take baths, swim, use a hot tub, or do anything that would put your wound underwater until your doctor says it is okay.   Raise (elevate) the injured area above the level of your heart while you are sitting or  lying down.   Do not scratch or pick at the wound.   Keep all follow-up visits as told by your doctor. This is important.  Contact a doctor if:   Medicine does not help your pain.   You have more redness, swelling, or pain in the area of your wound.   You have more fluid or blood coming from your wound.   Your wound feels warm to the touch.   You have pus coming from your wound.   You continue to notice a bad smell coming from your wound or your bandage.   Your wound that was closed breaks open.  Get help right away if:   You have a red streak going away from your wound.   You have a fever.  This information is not intended to replace advice given to you by your health care provider. Make sure you discuss any questions you have with your health care provider.  Document Released: 08/22/2008 Document Revised: 04/20/2016 Document Reviewed: 05/03/2015  Elsevier Interactive Patient Education  2019 [employer] Inc[employer]

## 2019-01-27 NOTE — MAU Note (Signed)
Had a c-section on 2/8-started having pain behind her incision a few days ago-today became too much to handle.  States she wasn't doing anything when the pain grew, was just sitting on the couch w/ her son.  Was seen on 2/26 for an incision check-stated everything looked good then.  Took tylenol (500 mg) for the pain around 1300.

## 2019-01-27 NOTE — ED Triage Notes (Signed)
Jeb Levering, RN given report and called trasported to MAU.

## 2019-01-27 NOTE — MAU Provider Note (Signed)
History     CSN: [id number]  Arrival date and time: 01/27/19 1757   First Provider Initiated Contact with Patient 01/27/19 1931      Chief Complaint  Patient presents with  . Abdominal Pain   HPI Alexandra Gray is a 28 y.o. [id number] postpartum patient who presents to MAU with chief complaint of incision pain. She s/p repeat LTCS 01/04/2019 and endorses a normal incision check 01/22/19. She endorses new onset incision pain 10/10 which began "a few days ago". She denies urinary symptoms, abdominal tenderness, vaginal bleeding, vaginal discharge, drainage from her incision, fever or recent illness.  Patient reports she took "one pill of Tylenol" early this morning but has not taken medication since that time. She endorses low level activity, predominantly caring for her 28 year old and newborn.   OB History    Gravida  2   Para  2   Term  2   Preterm      AB      Living  2     SAB      TAB      Ectopic      Multiple  0   Live Births  2           Past Medical History:  Diagnosis Date  . Chlamydia   . Depression    doing ok now  . Gestational diabetes   . Gonorrhea   . History of pregnancy induced hypertension 12/06/2016  . Hx of trichomoniasis   . Hypertension    sometimes is up, never on meds  . Mental disorder   . Ovarian cyst   . PID (pelvic inflammatory disease)   . Sleep apnea     Past Surgical History:  Procedure Laterality Date  . CESAREAN SECTION N/A 10/03/2015   Procedure: CESAREAN SECTION;  Surgeon: Tilda Burrow, MD;  Location: [facility] ORS;  Service: Obstetrics;  Laterality: N/A;  . CESAREAN SECTION N/A 01/04/2019   Procedure: CESAREAN SECTION;  Surgeon: Hermina Staggers, MD;  Location: [facility] BIRTHING SUITES;  Service: Obstetrics;  Laterality: N/A;  . KNEE SURGERY      Family History  Problem Relation Age of Onset  . Healthy Mother   . Healthy Father   . Healthy Brother   . Cancer Neg Hx   . Diabetes Neg Hx   . Heart disease Neg Hx   .  Hypertension Neg Hx   . Stroke Neg Hx   . Hearing loss Neg Hx     Social History   Tobacco Use  . Smoking status: Never Smoker  . Smokeless tobacco: Never Used  Substance Use Topics  . Alcohol use: No  . Drug use: No    Allergies:  Allergies  Allergen Reactions  . Nubain [Nalbuphine Hcl] Other (See Comments)    Makes patient hot    Medications Prior to Admission  Medication Sig Dispense Refill Last Dose  . acetaminophen (TYLENOL) 325 MG tablet Take 2 tablets (650 mg total) by mouth every 6 (six) hours as needed for mild pain or moderate pain. 60 tablet 0 01/27/2019 at Unknown time  . sertraline (ZOLOFT) 50 MG tablet Take 1 tablet (50 mg total) by mouth daily. 90 tablet 3 01/27/2019 at Unknown time  . ibuprofen (ADVIL,MOTRIN) 800 MG tablet Take 1 tablet (800 mg total) by mouth every 6 (six) hours. 30 tablet 0   . Prenatal Vit-Fe Fumarate-FA (MULTIVITAMIN-PRENATAL) 27-0.8 MG TABS tablet Take 1 tablet by mouth daily.  01/03/2019 at Unknown time  . senna-docusate (SENOKOT-S) 8.6-50 MG tablet Take 2 tablets by mouth daily. 30 tablet 0     Review of Systems  Constitutional: Negative for chills, fatigue and fever.  Respiratory: Negative for shortness of breath.   Gastrointestinal:       Incision pain 10/10 upon arrival in MAU  Genitourinary: Negative for difficulty urinating, dysuria, flank pain, vaginal bleeding, vaginal discharge and vaginal pain.  Musculoskeletal: Negative for back pain.  Neurological: Negative for headaches.  All other systems reviewed and are negative.  Physical Exam   Blood pressure (!) 140/94, pulse 71, temperature 98.4 F (36.9 C), resp. rate 17, height 5\' 1"  (1.549 m), weight 122.3 kg, SpO2 100 %, unknown if currently breastfeeding.  Physical Exam  Nursing note and vitals reviewed. Constitutional: She is oriented to person, place, and time. She appears well-developed and well-nourished.  Cardiovascular: Normal rate.  Respiratory: Effort normal and  breath sounds normal.  GI: Soft. Bowel sounds are normal. She exhibits no distension. There is no abdominal tenderness. There is no rebound and no guarding.  Musculoskeletal: Normal range of motion.  Neurological: She is alert and oriented to person, place, and time.  Skin: Skin is warm and dry. No rash noted. No erythema. No pallor.  LTCS incision is dry, well-approximated, no erythema, edema or drainage  Psychiatric: She has a normal mood and affect. Her behavior is normal. Judgment and thought content normal.    MAU Course/MDM  Procedures  --Pain reduced from 10 to 5 with medications administered in MAU --Slight elevation in WBCs, non-pregnant, no symptoms beyond incision pain. Intervention not indicated  Patient Vitals for the past 24 hrs:  BP Temp Pulse Resp SpO2 Height Weight  01/27/19 2048 117/61 - 82 19 - - -  01/27/19 1923 (!) 140/94 98.4 F (36.9 C) 71 17 100 % - -  01/27/19 1913 - - - - - 5\' 1"  (1.549 m) 122.3 kg    Results for orders placed or performed during the hospital encounter of 01/27/19 (from the past 24 hour(s))  Urinalysis, Routine w reflex microscopic     Status: Abnormal   Collection Time: 01/27/19  7:24 PM  Result Value Ref Range   Color, Urine YELLOW YELLOW   APPearance HAZY (A) CLEAR   Specific Gravity, Urine 1.012 1.005 - 1.030   pH 6.0 5.0 - 8.0   Glucose, UA NEGATIVE NEGATIVE mg/dL   Hgb urine dipstick NEGATIVE NEGATIVE   Bilirubin Urine NEGATIVE NEGATIVE   Ketones, ur NEGATIVE NEGATIVE mg/dL   Protein, ur NEGATIVE NEGATIVE mg/dL   Nitrite NEGATIVE NEGATIVE   Leukocytes,Ua TRACE (A) NEGATIVE   RBC / HPF 0-5 0 - 5 RBC/hpf   WBC, UA 0-5 0 - 5 WBC/hpf   Bacteria, UA RARE (A) NONE SEEN   Squamous Epithelial / LPF 0-5 0 - 5   Mucus PRESENT   Pregnancy, urine POC     Status: None   Collection Time: 01/27/19  7:27 PM  Result Value Ref Range   Preg Test, Ur NEGATIVE NEGATIVE  CBC with Differential/Platelet     Status: Abnormal   Collection Time:  01/27/19  7:55 PM  Result Value Ref Range   WBC 13.9 (H) 4.0 - 10.5 K/uL   RBC 5.45 (H) 3.87 - 5.11 MIL/uL   Hemoglobin 14.1 12.0 - 15.0 g/dL   HCT 38.3 29.1 - 91.6 %   MCV 80.9 80.0 - 100.0 fL   MCH 25.9 (L) 26.0 - 34.0 pg  MCHC 32.0 30.0 - 36.0 g/dL   RDW 16.1 09.6 - 04.5 %   Platelets 322 150 - 400 K/uL   nRBC 0.0 0.0 - 0.2 %   Neutrophils Relative % 71 %   Neutro Abs 9.8 (H) 1.7 - 7.7 K/uL   Lymphocytes Relative 22 %   Lymphs Abs 3.0 0.7 - 4.0 K/uL   Monocytes Relative 5 %   Monocytes Absolute 0.7 0.1 - 1.0 K/uL   Eosinophils Relative 1 %   Eosinophils Absolute 0.2 0.0 - 0.5 K/uL   Basophils Relative 0 %   Basophils Absolute 0.0 0.0 - 0.1 K/uL   Immature Granulocytes 1 %   Abs Immature Granulocytes 0.07 0.00 - 0.07 K/uL     Assessment and Plan  --28 y.o. [id number] postpartum patient --Normal, well-healing incision --Pain managed with previously prescribed medications, continue at home --Discharge home in stable condition  F/U: Postpartum appt scheduled for later this month at [facility]  Calvert Cantor, CNM 01/27/2019, 8:58 PM

## 2019-02-11 ENCOUNTER — Ambulatory Visit: Payer: Self-pay | Admitting: Family Medicine

## 2019-02-17 ENCOUNTER — Ambulatory Visit: Payer: Self-pay | Admitting: Advanced Practice Midwife

## 2019-02-17 ENCOUNTER — Other Ambulatory Visit: Payer: Self-pay

## 2019-03-04 ENCOUNTER — Ambulatory Visit (INDEPENDENT_AMBULATORY_CARE_PROVIDER_SITE_OTHER): Payer: Medicaid Other | Admitting: Obstetrics

## 2019-03-04 ENCOUNTER — Encounter: Payer: Self-pay | Admitting: Obstetrics

## 2019-03-04 ENCOUNTER — Other Ambulatory Visit: Payer: Self-pay

## 2019-03-04 VITALS — BP 127/90 | HR 75 | Ht 61.0 in | Wt 279.6 lb

## 2019-03-04 DIAGNOSIS — Z3202 Encounter for pregnancy test, result negative: Secondary | ICD-10-CM | POA: Diagnosis not present

## 2019-03-04 DIAGNOSIS — Z30017 Encounter for initial prescription of implantable subdermal contraceptive: Secondary | ICD-10-CM | POA: Diagnosis not present

## 2019-03-04 LAB — POCT URINE PREGNANCY: Preg Test, Ur: NEGATIVE

## 2019-03-04 MED ORDER — ETONOGESTREL 68 MG ~~LOC~~ IMPL
68.0000 mg | DRUG_IMPLANT | Freq: Once | SUBCUTANEOUS | Status: AC
  Administered 2019-03-04: 68 mg via SUBCUTANEOUS

## 2019-03-04 NOTE — Progress Notes (Signed)
Nexplanon Procedure Note   PRE-OP DIAGNOSIS: Desired Long- Term, Reversible Contraception ( LARC ) POST-OP DIAGNOSIS: Same  PROCEDURE: Nexplanon  Placement Performing Provider: Brock Bad MD   Patient education prior to procedure, explained risk, benefits of Nexplanon, reviewed alternative options. Patient reported understanding. Gave consent to continue with procedure.   PROCEDURE:  Pregnancy Text :  Negative Site (check):      left arm         Sterile Preparation:   Betadinex3 Lot # [id number] Expiration Date :  2022 AUG 06  Insertion site was selected 8 - 10 cm from medial epicondyle and marked along with guiding site using sterile marker. Procedure area was prepped and draped in a sterile fashion. 1% Lidocaine 1.5 ml given prior to procedure. Nexplanon  was inserted subcutaneously.Needle was removed from the insertion site. Nexplanon capsule was palpated by provider and patient to assure satisfactory placement. Dressing applied.  Followup: The patient tolerated the procedure well without complications.  Standard post-procedure care is explained and return precautions are given.  Brock Bad MD 03-04-2019

## 2019-03-04 NOTE — Progress Notes (Signed)
Pt presents for a nexplanon insertion. Pt states that she has not had a cycle since delivery. She also states that she has not has sexual IC.

## 2019-03-18 ENCOUNTER — Encounter: Payer: Medicaid Other | Admitting: Obstetrics

## 2019-03-25 ENCOUNTER — Ambulatory Visit: Payer: Medicaid Other | Admitting: Obstetrics

## 2019-04-01 ENCOUNTER — Encounter: Payer: Self-pay | Admitting: Obstetrics

## 2019-04-01 ENCOUNTER — Telehealth: Payer: Self-pay | Admitting: Obstetrics

## 2019-06-05 ENCOUNTER — Ambulatory Visit: Payer: Medicaid Other | Admitting: Obstetrics

## 2019-06-05 ENCOUNTER — Other Ambulatory Visit: Payer: Self-pay

## 2019-06-05 ENCOUNTER — Encounter: Payer: Self-pay | Admitting: Obstetrics

## 2019-06-05 VITALS — BP 137/92 | HR 85 | Wt 298.0 lb

## 2019-06-05 DIAGNOSIS — Z30011 Encounter for initial prescription of contraceptive pills: Secondary | ICD-10-CM

## 2019-06-05 DIAGNOSIS — Z3009 Encounter for other general counseling and advice on contraception: Secondary | ICD-10-CM

## 2019-06-05 DIAGNOSIS — Z6841 Body Mass Index (BMI) 40.0 and over, adult: Secondary | ICD-10-CM

## 2019-06-05 DIAGNOSIS — Z3046 Encounter for surveillance of implantable subdermal contraceptive: Secondary | ICD-10-CM | POA: Diagnosis not present

## 2019-06-05 DIAGNOSIS — N946 Dysmenorrhea, unspecified: Secondary | ICD-10-CM

## 2019-06-05 DIAGNOSIS — N939 Abnormal uterine and vaginal bleeding, unspecified: Secondary | ICD-10-CM

## 2019-06-05 MED ORDER — BALCOLTRA 0.1-20 MG-MCG(21) PO TABS: 1.0000 | ORAL_TABLET | Freq: Every day | ORAL | 11 refills | Status: DC

## 2019-06-05 NOTE — Progress Notes (Signed)
Pt states she is having severe cramping, bleeding and increase in HA's since having Nexplanon placed.  Device was placed in April this year.

## 2019-06-05 NOTE — Progress Notes (Signed)
Patient ID: Alexandra Gray, female   DOB: [birth year], 28 y.o.   MRN: [id number]  Chief Complaint  Patient presents with  . Contraception    HPI Alexandra Gray is a 28 y.o. female.  Complains of irregular vaginal bleeding, severe cramping and headaches since Nexplanon insertion. HPI  Past Medical History:  Diagnosis Date  . Chlamydia   . Depression    doing ok now  . Gestational diabetes   . Gonorrhea   . History of pregnancy induced hypertension 12/06/2016  . Hx of trichomoniasis   . Hypertension    sometimes is up, never on meds  . Mental disorder   . Ovarian cyst   . PID (pelvic inflammatory disease)   . Sleep apnea     Past Surgical History:  Procedure Laterality Date  . CESAREAN SECTION N/A 10/03/2015   Procedure: CESAREAN SECTION;  Surgeon: Tilda BurrowJohn Ferguson V, MD;  Location: [facility];  Service: Obstetrics;  Laterality: N/A;  . CESAREAN SECTION N/A 01/04/2019   Procedure: CESAREAN SECTION;  Surgeon: Hermina StaggersErvin, Michael L, MD;  [facility];  Service: Obstetrics;  Laterality: N/A;  . KNEE SURGERY      Family History  Problem Relation Age of Onset  . Healthy Mother   . Healthy Father   . Healthy Brother   . Cancer Neg Hx   . Diabetes Neg Hx   . Heart disease Neg Hx   . Hypertension Neg Hx   . Stroke Neg Hx   . Hearing loss Neg Hx     Social History Social History   Tobacco Use  . Smoking status: Never Smoker  . Smokeless tobacco: Never Used  Substance Use Topics  . Alcohol use: No  . Drug use: No    Allergies  Allergen Reactions  . Nubain [Nalbuphine Hcl] Other (See Comments)    Makes patient hot    Current Outpatient Medications  Medication Sig Dispense Refill  . sertraline (ZOLOFT) 50 MG tablet Take 1 tablet (50 mg total) by mouth daily. 90 tablet 3  . acetaminophen (TYLENOL) 325 MG tablet Take 2 tablets (650 mg total) by mouth every 6 (six) hours as needed for mild pain or moderate pain. 60 tablet 0  . ibuprofen (ADVIL,MOTRIN) 800 MG  tablet Take 1 tablet (800 mg total) by mouth every 6 (six) hours. 30 tablet 0  . Levonorgest-Eth Estrad-Fe Bisg (BALCOLTRA) 0.1-20 MG-MCG(21) TABS Take 1 tablet by mouth daily. 28 tablet 11  . Prenatal Vit-Fe Fumarate-FA (MULTIVITAMIN-PRENATAL) 27-0.8 MG TABS tablet Take 1 tablet by mouth daily.     Marland Kitchen. senna-docusate (SENOKOT-S) 8.6-50 MG tablet Take 2 tablets by mouth daily. 30 tablet 0   No current facility-administered medications for this visit.     Review of Systems Review of Systems Constitutional: negative for fatigue and weight loss Respiratory: negative for cough and wheezing Cardiovascular: negative for chest pain, fatigue and palpitations Gastrointestinal: negative for abdominal pain and change in bowel habits Genitourinary:negative Integument/breast: negative for nipple discharge Musculoskeletal:negative for myalgias Neurological: negative for gait problems and tremors Behavioral/Psych: negative for abusive relationship, depression Endocrine: negative for temperature intolerance      Blood pressure (!) 137/92, pulse 85, weight 298 lb (135.2 kg), not currently breastfeeding.  Physical Exam Physical Exam General:   alert  Skin:   no rash or abnormalities  Lungs:   clear to auscultation bilaterally  Heart:   regular rate and rhythm, S1, S2 normal, no murmur, click, rub or gallop   50% of  25 min visit spent on counseling and coordination of care.   Data Reviewed Labs  Assessment     1. Abnormal uterine bleeding (AUB)  2. Severe dysmenorrhea  3. Encounter for Nexplanon removal  4. Encounter for other general counseling or advice on contraception - wants OCP's  5. Encounter for initial prescription of contraceptive pills Rx: - Levonorgest-Eth Estrad-Fe Bisg (BALCOLTRA) 0.1-20 MG-MCG(21) TABS; Take 1 tablet by mouth daily.  Dispense: 28 tablet; Refill: 11  6. Class 3 severe obesity due to excess calories without serious comorbidity with body mass index (BMI) of  50.0 to 59.9 in adult [facility]) - program of caloric reduction, exercise and behavioral modification recommended    Plan   Nexplanon Removal  No orders of the defined types were placed in this encounter.  Meds ordered this encounter  Medications  . Levonorgest-Eth Estrad-Fe Bisg (BALCOLTRA) 0.1-20 MG-MCG(21) TABS    Sig: Take 1 tablet by mouth daily.    Dispense:  28 tablet    Refill:  11     A.  MD 06-04-2018   [place] REMOVAL NOTE  Date of LMP:   unknown  Contraception used: *Nexplanon   Indications:  The patient desires removal of Nexplanon.  She understands risks, benefits, and alternatives to Implanon and would like to proceed.  Anesthesia:   Lidocaine 1% plain.  Procedure:  A time-out was performed confirming the procedure and the patient's allergy status.  Complications: None                      The rod was palpated and the area was sterilely prepped.  The area beneath the distal tip was anesthetized with 1% xylocaine and the skin incised                       Over the tip and the tip was exposed, grasped with forcep and removed intact.  A single suture of 4-0 Vicryl was used to close incision.  Steri strip                       And a bandage applied and the arm was wrapped with gauze bandage.  The patient tolerated well.  Instructions:  The patient was instructed to remove the dressing in 24 hours and that some bruising is to be expected.  She was advised to use over the counter analgesics as needed for any pain at the site.  She is to keep the area dry for 24 hours and to call if her hand or arm becomes cold, numb, or blue.  Return visit:  Return in 2 weeks   Shelly Bombard MD 06-05-2019

## 2019-06-07 ENCOUNTER — Emergency Department (HOSPITAL_COMMUNITY): Payer: Medicaid Other

## 2019-06-07 ENCOUNTER — Encounter (HOSPITAL_COMMUNITY): Payer: Self-pay

## 2019-06-07 ENCOUNTER — Emergency Department (HOSPITAL_COMMUNITY)
Admission: EM | Admit: 2019-06-07 | Discharge: 2019-06-07 | Disposition: A | Discharge status: Home / Self Care | Payer: Medicaid Other | Attending: Emergency Medicine | Admitting: Emergency Medicine

## 2019-06-07 ENCOUNTER — Other Ambulatory Visit: Payer: Self-pay

## 2019-06-07 DIAGNOSIS — N730 Acute parametritis and pelvic cellulitis: Secondary | ICD-10-CM | POA: Insufficient documentation

## 2019-06-07 DIAGNOSIS — N898 Other specified noninflammatory disorders of vagina: Secondary | ICD-10-CM | POA: Insufficient documentation

## 2019-06-07 DIAGNOSIS — R109 Unspecified abdominal pain: Secondary | ICD-10-CM | POA: Diagnosis present

## 2019-06-07 DIAGNOSIS — R1011 Right upper quadrant pain: Secondary | ICD-10-CM | POA: Insufficient documentation

## 2019-06-07 DIAGNOSIS — Z79899 Other long term (current) drug therapy: Secondary | ICD-10-CM | POA: Insufficient documentation

## 2019-06-07 LAB — COMPREHENSIVE METABOLIC PANEL
ALT: 212 U/L — ABNORMAL HIGH (ref 0–44)
AST: 325 U/L — ABNORMAL HIGH (ref 15–41)
Albumin: 3.8 g/dL (ref 3.5–5.0)
Alkaline Phosphatase: 151 U/L — ABNORMAL HIGH (ref 38–126)
Anion gap: 9 (ref 5–15)
BUN: 8 mg/dL (ref 6–20)
CO2: 28 mmol/L (ref 22–32)
Calcium: 9.1 mg/dL (ref 8.9–10.3)
Chloride: 102 mmol/L (ref 98–111)
Creatinine, Ser: 0.83 mg/dL (ref 0.44–1.00)
GFR calc Af Amer: >60 mL/min (ref >60)
GFR calc non Af Amer: >60 mL/min (ref >60)
Glucose, Bld: 97 mg/dL (ref 70–99)
Potassium: 3.5 mmol/L (ref 3.5–5.1)
Sodium: 139 mmol/L (ref 135–145)
Total Bilirubin: 2.1 mg/dL — ABNORMAL HIGH (ref 0.3–1.2)
Total Protein: 7.3 g/dL (ref 6.5–8.1)

## 2019-06-07 LAB — URINALYSIS, ROUTINE W REFLEX MICROSCOPIC
Bilirubin Urine: NEGATIVE
Glucose, UA: NEGATIVE mg/dL
Hgb urine dipstick: NEGATIVE
Ketones, ur: NEGATIVE mg/dL
Leukocytes,Ua: NEGATIVE
Nitrite: POSITIVE — AB
Protein, ur: NEGATIVE mg/dL
Specific Gravity, Urine: 1.02 (ref 1.005–1.030)
pH: 6 (ref 5.0–8.0)

## 2019-06-07 LAB — CBC
HCT: 43.3 % (ref 36.0–46.0)
Hemoglobin: 13.8 g/dL (ref 12.0–15.0)
MCH: 26 pg (ref 26.0–34.0)
MCHC: 31.9 g/dL (ref 30.0–36.0)
MCV: 81.7 fL (ref 80.0–100.0)
Platelets: 298 10*3/uL (ref 150–400)
RBC: 5.3 MIL/uL — ABNORMAL HIGH (ref 3.87–5.11)
RDW: 13.8 % (ref 11.5–15.5)
WBC: 9.4 10*3/uL (ref 4.0–10.5)
nRBC: 0 % (ref 0.0–0.2)

## 2019-06-07 LAB — WET PREP, GENITAL
Sperm: NONE SEEN
Trich, Wet Prep: NONE SEEN

## 2019-06-07 LAB — LIPASE, BLOOD: Lipase: 27 U/L (ref 11–51)

## 2019-06-07 LAB — I-STAT BETA HCG BLOOD, ED (MC, WL, AP ONLY): I-stat hCG, quantitative: <5 m[IU]/mL (ref <5)

## 2019-06-07 IMAGING — US TRANSVAGINAL ULTRASOUND OF PELVIS
1 series · 13 of 25 positions shown · non-contrast
Comparison: None.

CLINICAL DATA: Pelvic pain for 5 months.

EXAM:
TRANSABDOMINAL AND TRANSVAGINAL ULTRASOUND OF PELVIS
DOPPLER ULTRASOUND OF OVARIES
TECHNIQUE: Both transabdominal and transvaginal ultrasound examinations of the
pelvis were performed. Transabdominal technique was performed for
global imaging of the pelvis including uterus, ovaries, adnexal
regions, and pelvic cul-de-sac.
It was necessary to proceed with endovaginal exam following the
transabdominal exam to visualize the uterus and adnexa. Color and
duplex Doppler ultrasound was utilized to evaluate blood flow to the
ovaries.

[Series 1: transvaginal ultrasound of pelvis · 80 acquisitions, 13 frames shown]
[im 1/80]
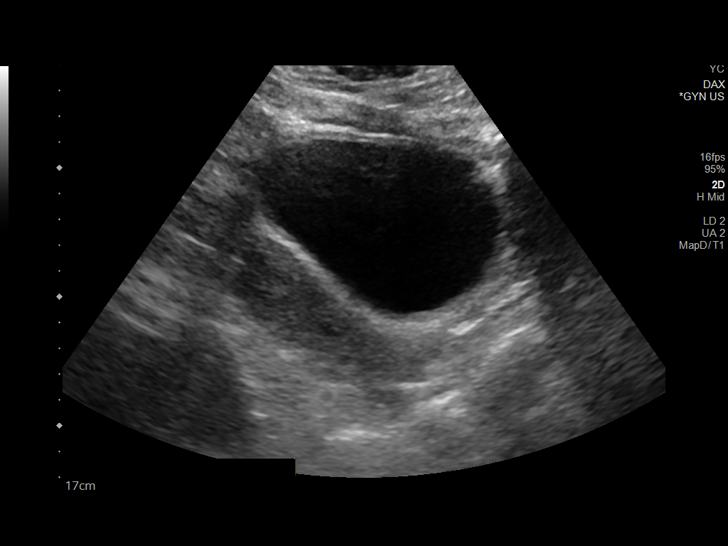
[im 7/80]
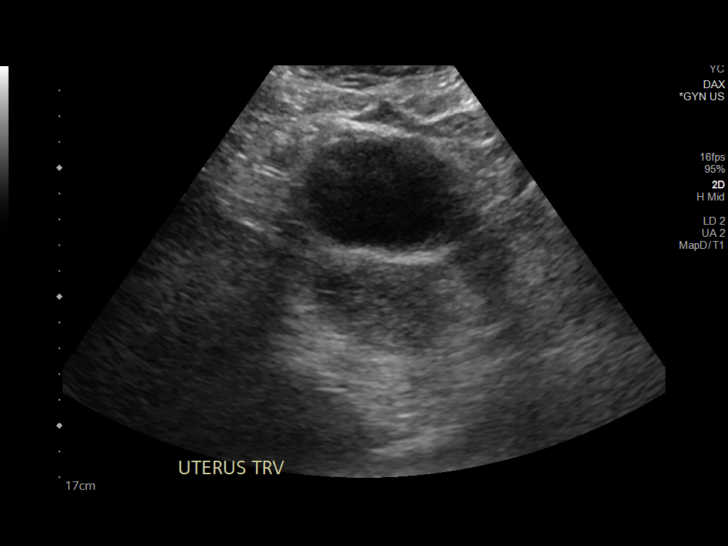
[im 14/80]
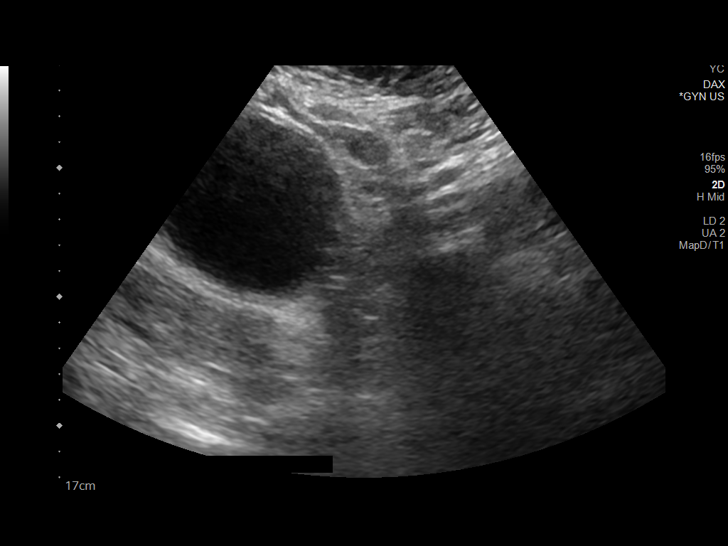
[im 20/80]
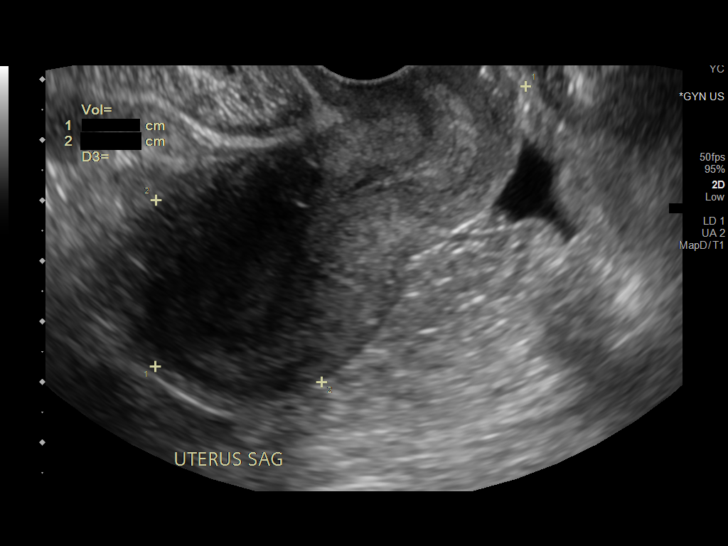
[im 27/80]
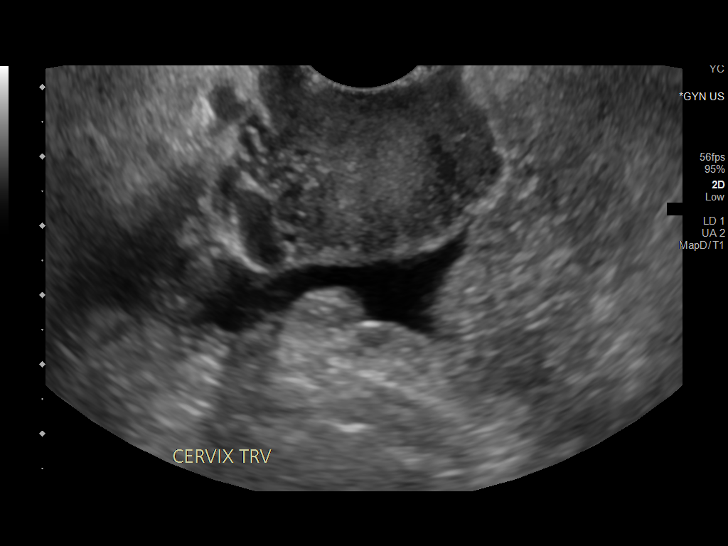
[im 33/80]
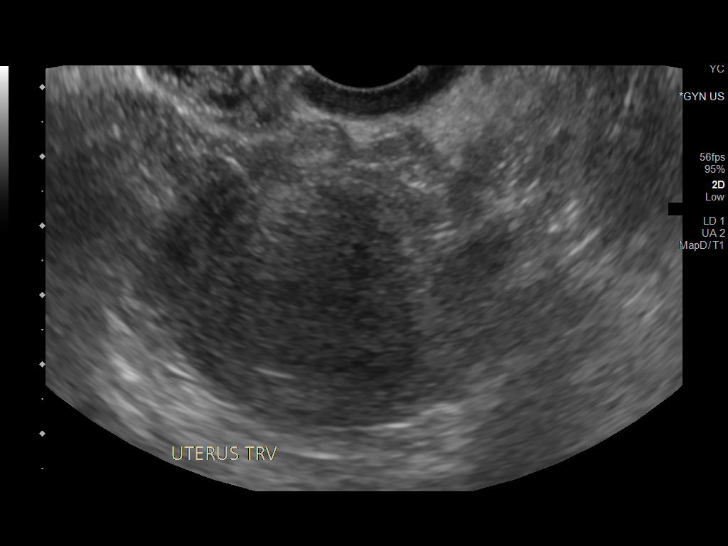
[im 40/80]
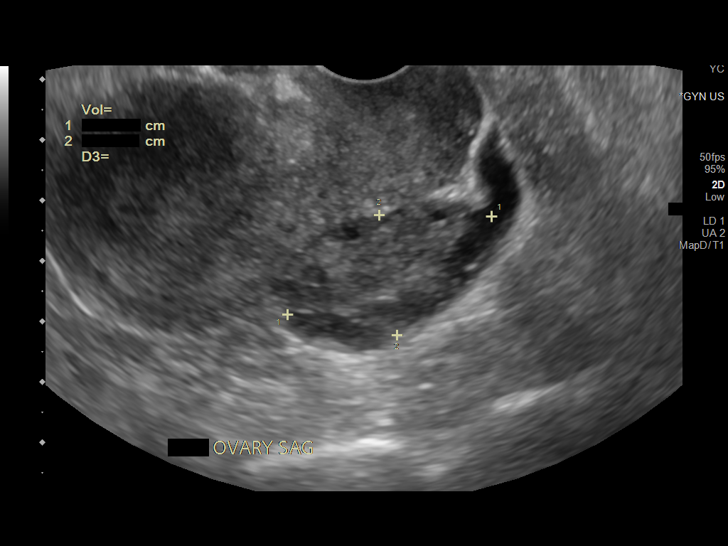
[im 47/80]
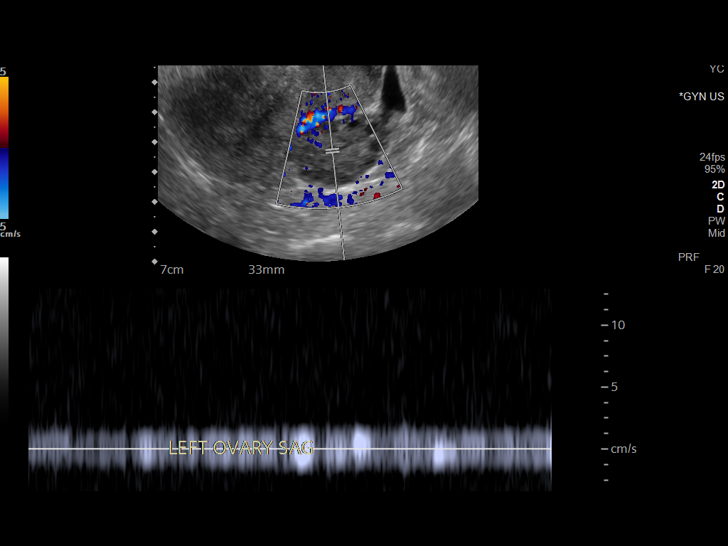
[im 53/80]
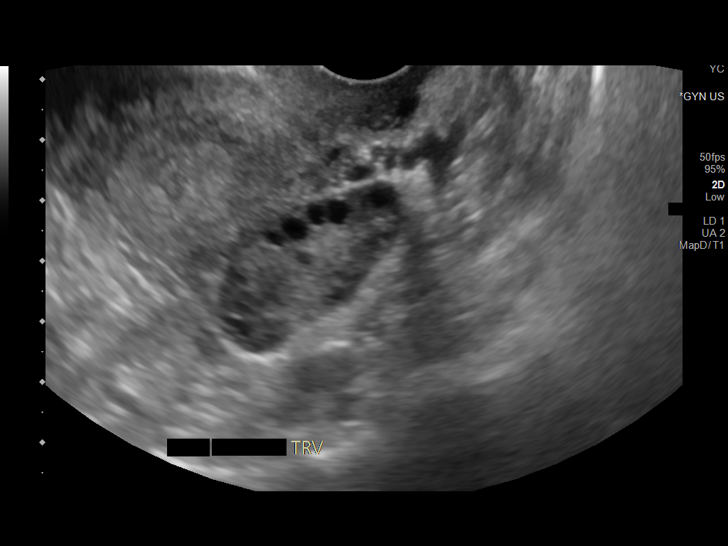
[im 60/80]
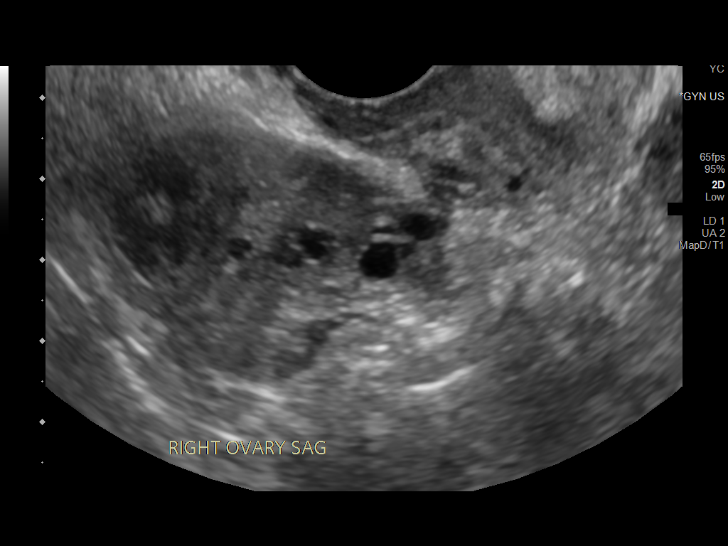
[im 66/80]
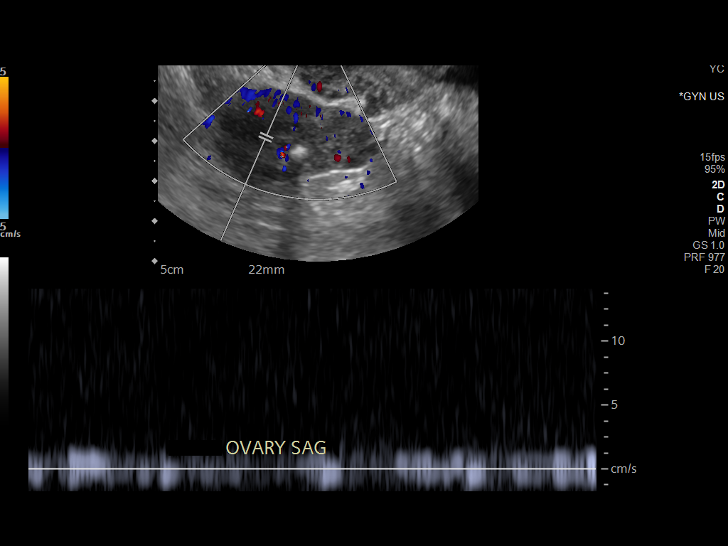
[im 73/80]
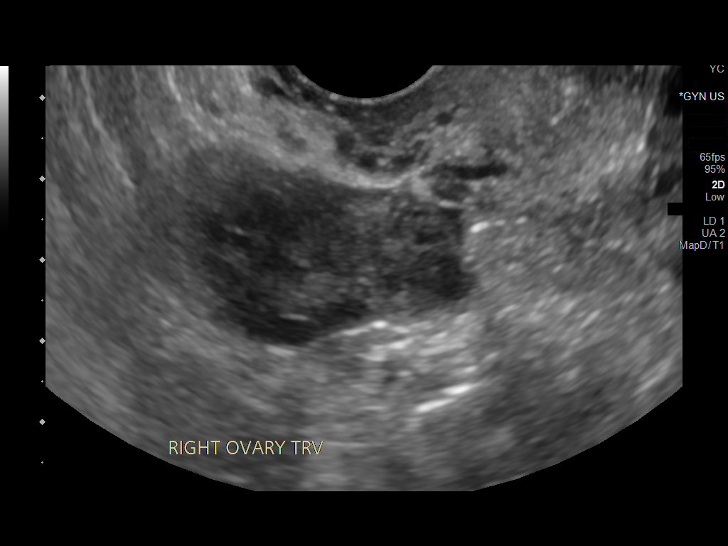
[im 80/80]
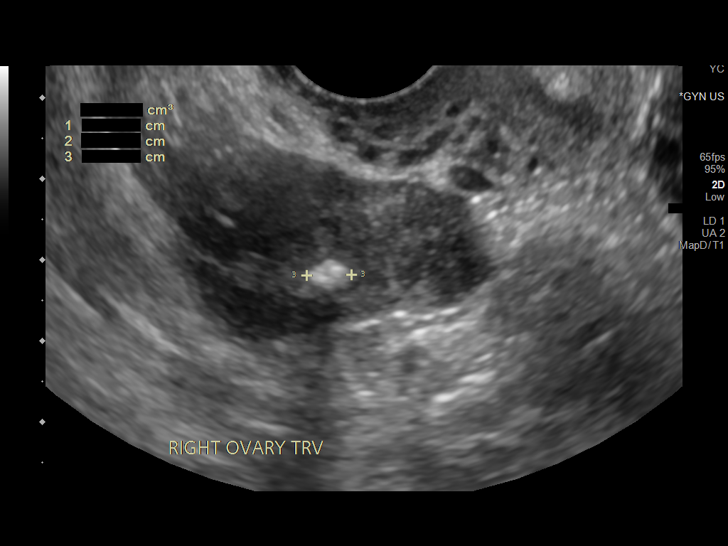

[13 of 25 positions shown; findings below may reference images not displayed]

FINDINGS: Uterus

Measurements: 7.7 x 4.1 x 4.5 cm = volume: 74 mL. No fibroids or
other mass visualized.

Endometrium

Thickness: 6.9 mm.  No focal abnormality visualized.

Right ovary

Measurements: 3.8 x 2.4 x 3.4 cm = volume: 16.3 mL. 6 mm echogenic
focus within the right adnexa, etiology uncertain.

Left ovary

Measurements: 3.7 x 2.0 x 2.7 cm = volume: 10.6 mL. Normal
appearance/no adnexal mass.

Pulsed Doppler evaluation of both ovaries demonstrates normal
low-resistance arterial and venous waveforms.

Other findings

Small amount free fluid.
IMPRESSION: Normal appearance of the uterus and left ovary.

6 mm echogenic focus within the right adnexa, etiology uncertain but
most likely benign.

Small amount of free fluid in the pelvis, may be physiologic.

## 2019-06-07 IMAGING — CT CT ABDOMEN AND PELVIS WITH CONTRAST
2 of 4 series · 17 of 46 positions shown, 19 images · IV contrast (omnipaque)
Comparison: [DATE]

CLINICAL DATA: Abdominal pain for 2 weeks

EXAM:
CT ABDOMEN AND PELVIS WITH CONTRAST
TECHNIQUE: Multidetector CT imaging of the abdomen and pelvis was performed
using the standard protocol following bolus administration of
intravenous contrast.
CONTRAST:  100mL OMNIPAQUE IOHEXOL 300 MG/ML  SOLN

[Series 3: a/p w/ 5mm · axial · 0.98mm/px · z∈[+645,+1095]mm · 14 of 100 slices shown, 16 images]
[im 5/100  soft-tissue]
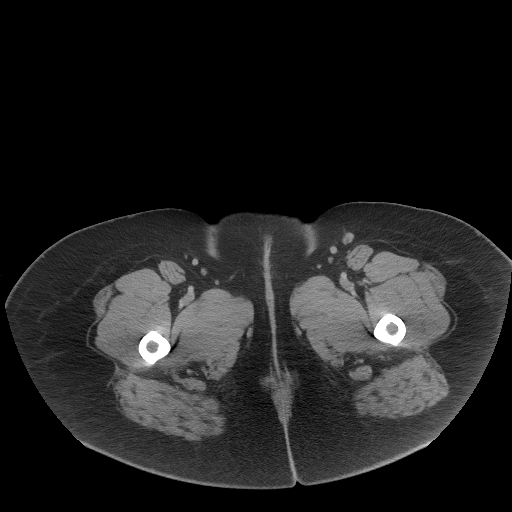
[im 5/100  bone]
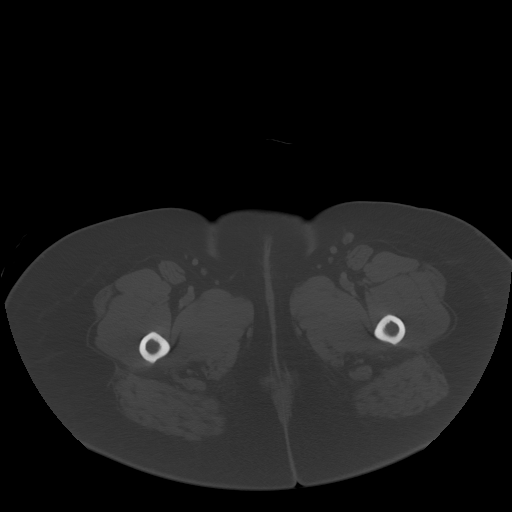
[im 13/100  soft-tissue]
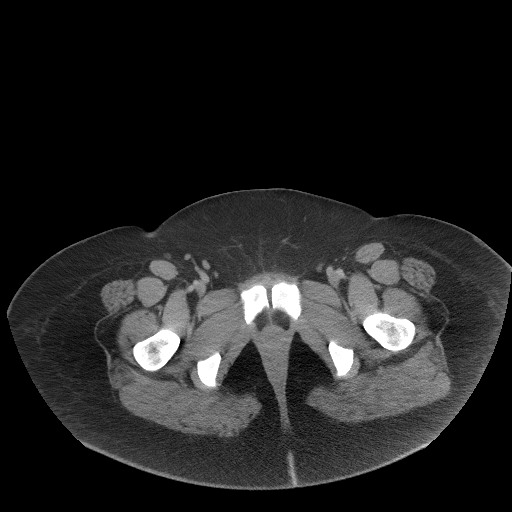
[im 18/100  soft-tissue]
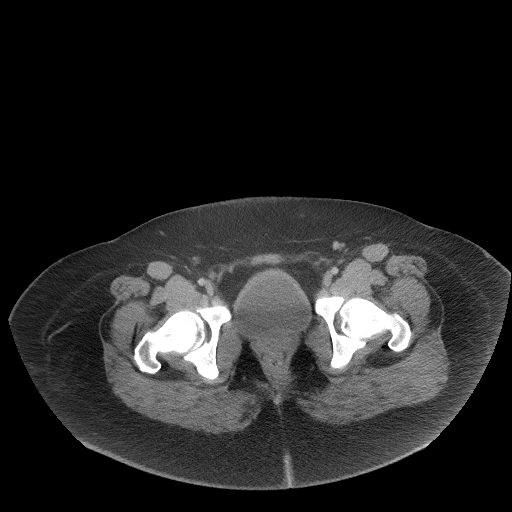
[im 26/100  soft-tissue]
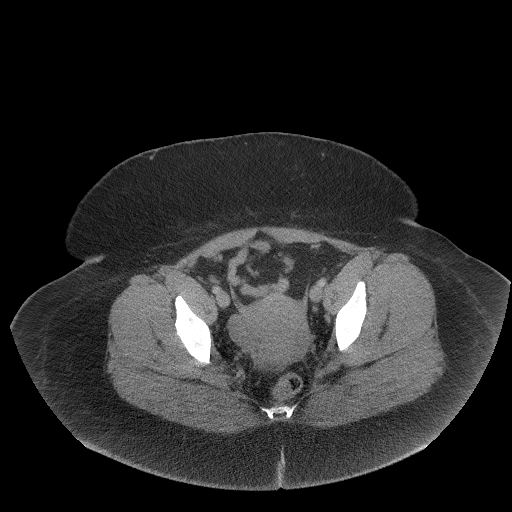
[im 35/100  soft-tissue]
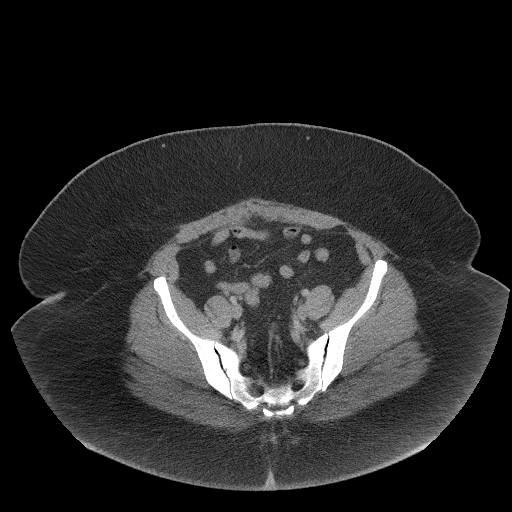
[im 39/100  soft-tissue]
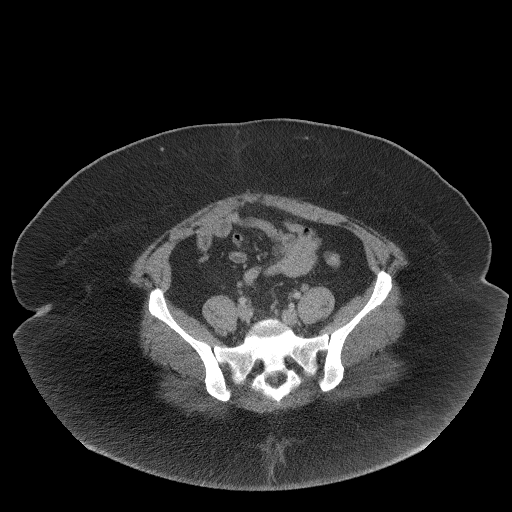
[im 48/100  soft-tissue]
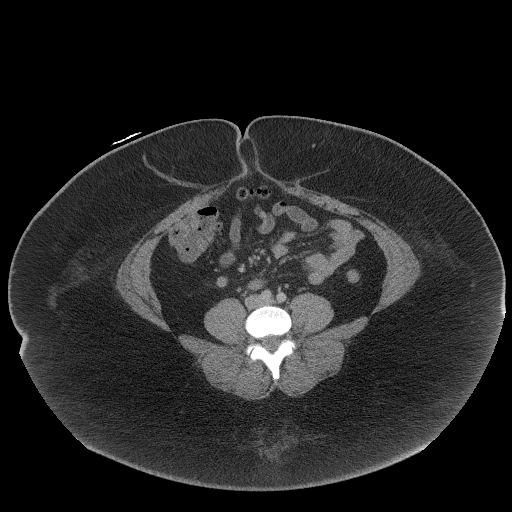
[im 52/100  soft-tissue]
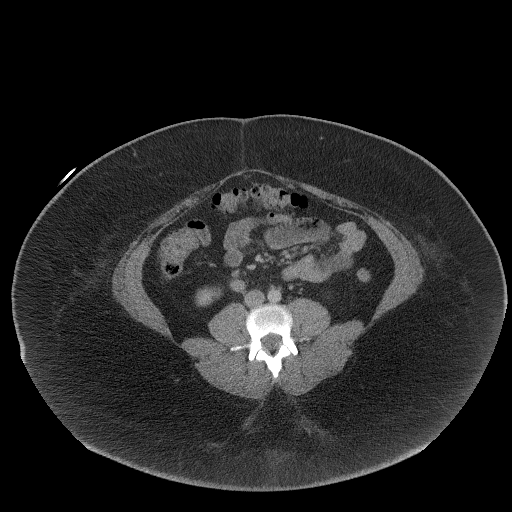
[im 61/100  soft-tissue]
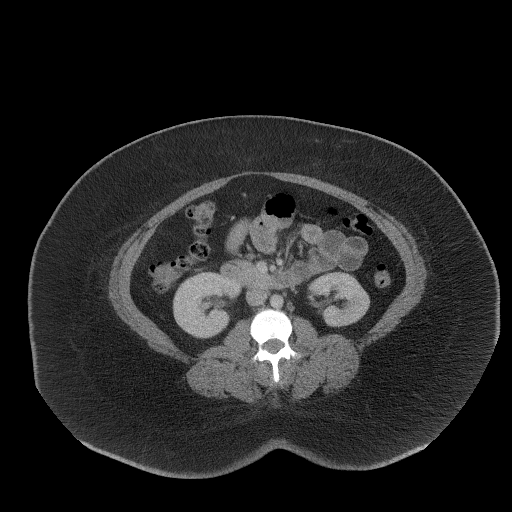
[im 61/100  bone]
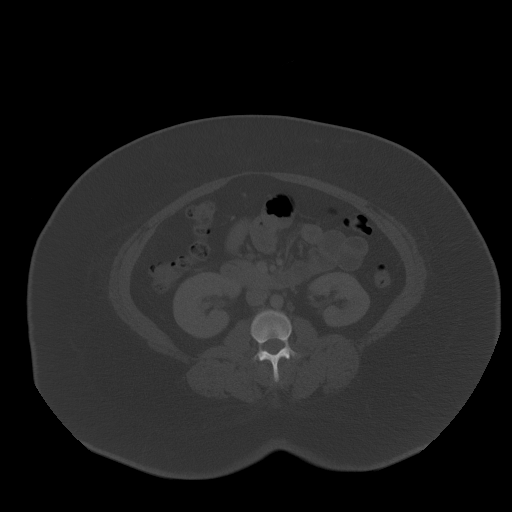
[im 65/100  soft-tissue]
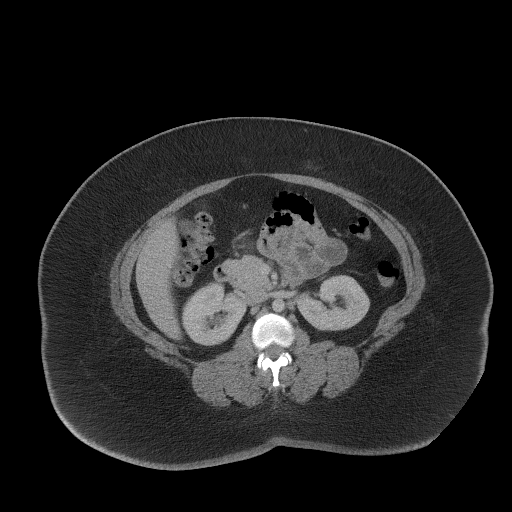
[im 74/100  soft-tissue]
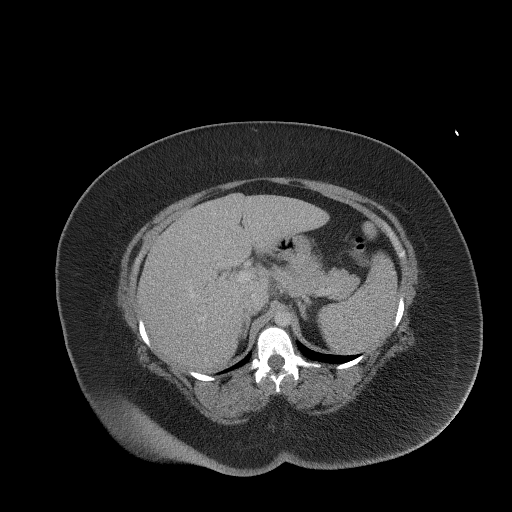
[im 82/100  soft-tissue]
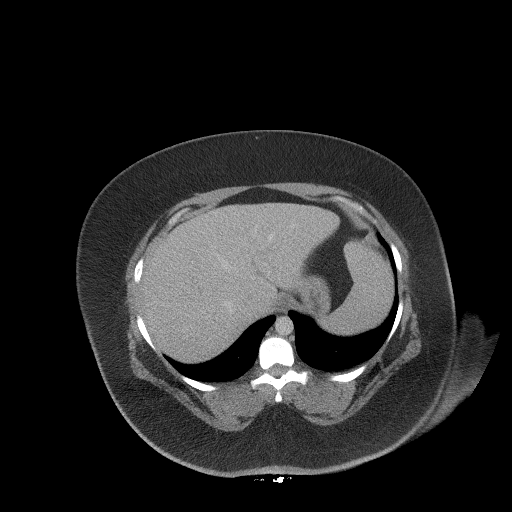
[im 87/100  soft-tissue]
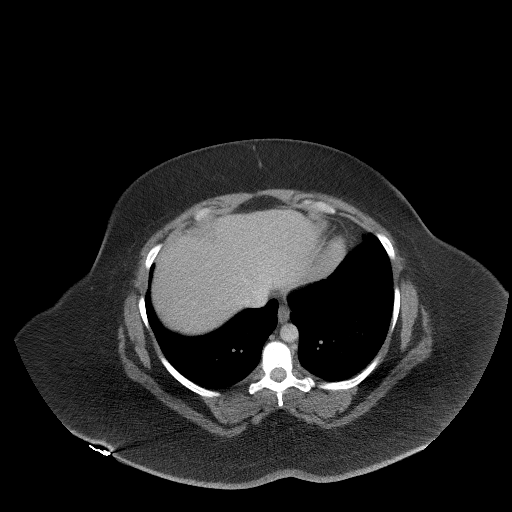
[im 95/100  soft-tissue]
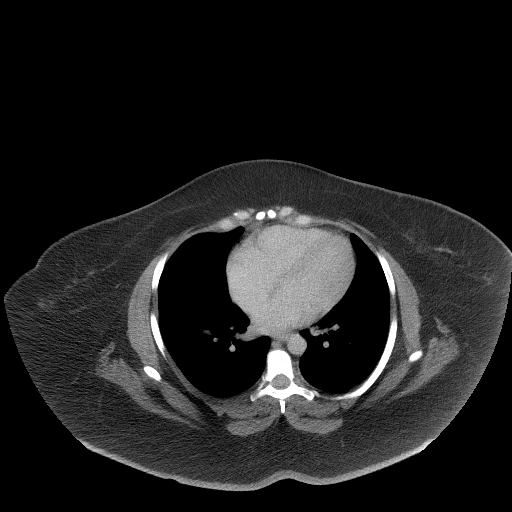

[Series 6: a/p w/ cor · coronal · 0.97mm/px · 3 of 177 slices shown]
[im 59/177  soft-tissue]
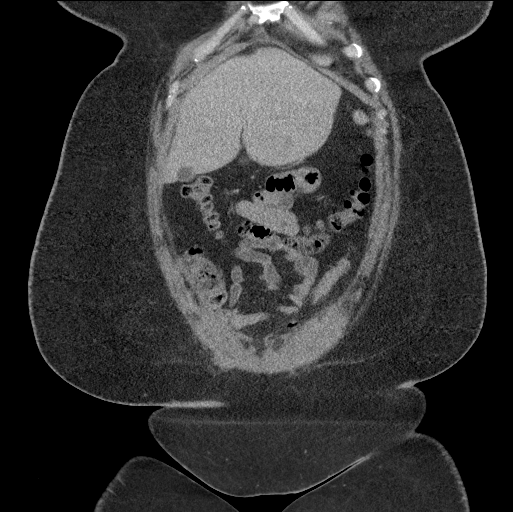
[im 79/177  soft-tissue]
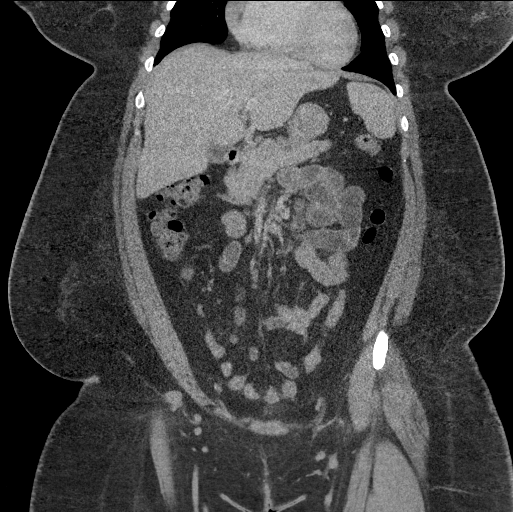
[im 98/177  soft-tissue]
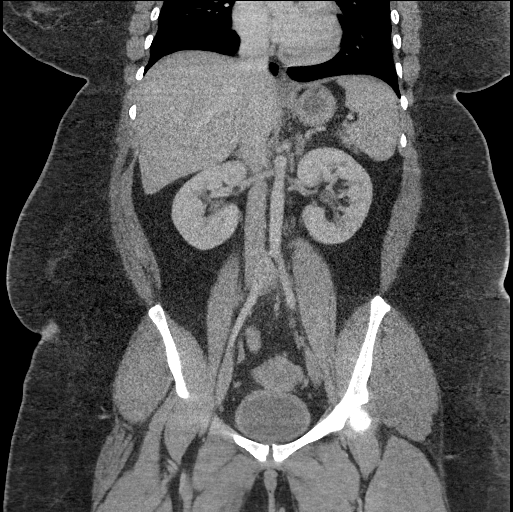

[17 of 46 positions shown; findings below may reference images not displayed]

FINDINGS: Lower chest: No acute abnormality.

Hepatobiliary: No focal liver abnormality is seen. No gallstones,
gallbladder wall thickening, or biliary dilatation.

Pancreas: Unremarkable. No pancreatic ductal dilatation or
surrounding inflammatory changes.

Spleen: Normal in size without focal abnormality.

Adrenals/Urinary Tract: Adrenal glands are within normal limits.
Kidneys are well visualized bilaterally. No renal calculi or
obstructive changes are seen. The ureters are well visualized
bilaterally. The bladder is partially distended.

Stomach/Bowel: The colon is predominately decompressed. No
inflammatory or obstructive changes are seen. The appendix is within
normal limits. No small bowel or gastric abnormality is seen.

Vascular/Lymphatic: No significant vascular findings are present. No
enlarged abdominal or pelvic lymph nodes. Stable small bilateral
iliac chain lymph nodes are noted.

Reproductive: Uterus and bilateral adnexa are unremarkable.

Other: No ascites is identified. Small fat containing umbilical
hernia is again noted.

Musculoskeletal: No acute or significant osseous findings.
IMPRESSION: No acute abnormality noted.

Chronic changes as described above.

## 2019-06-07 IMAGING — US ULTRASOUND ABDOMEN LIMITED
1 series · 14 of 25 positions shown · non-contrast
Comparison: CT scan [DATE]

CLINICAL DATA: Right upper quadrant pain

EXAM:
ULTRASOUND ABDOMEN LIMITED RIGHT UPPER QUADRANT

[Series 1: ultrasound abdomen limited · 14 of 57 slices shown]
[im 1/57]
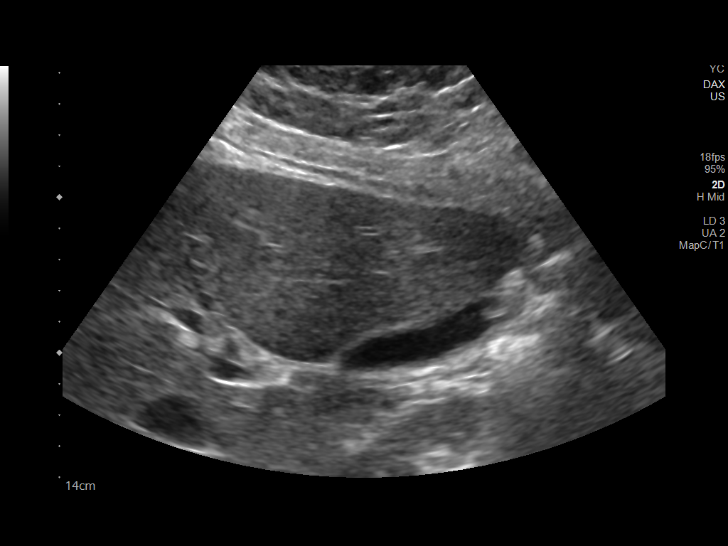
[im 5/57]
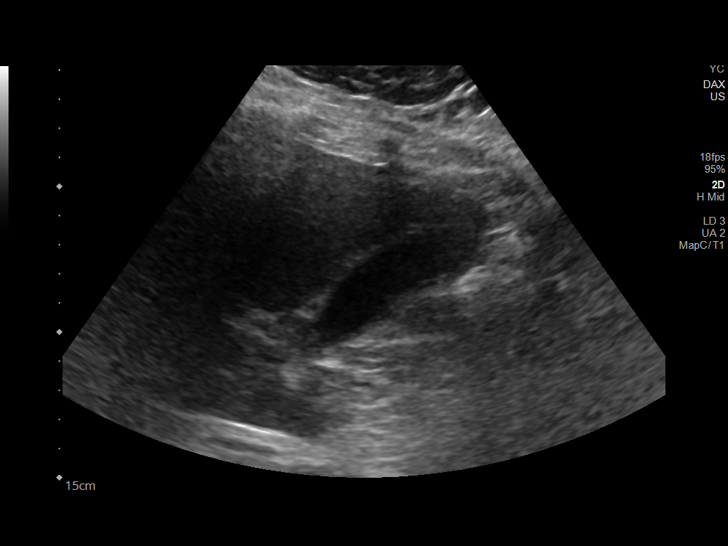
[im 10/57]
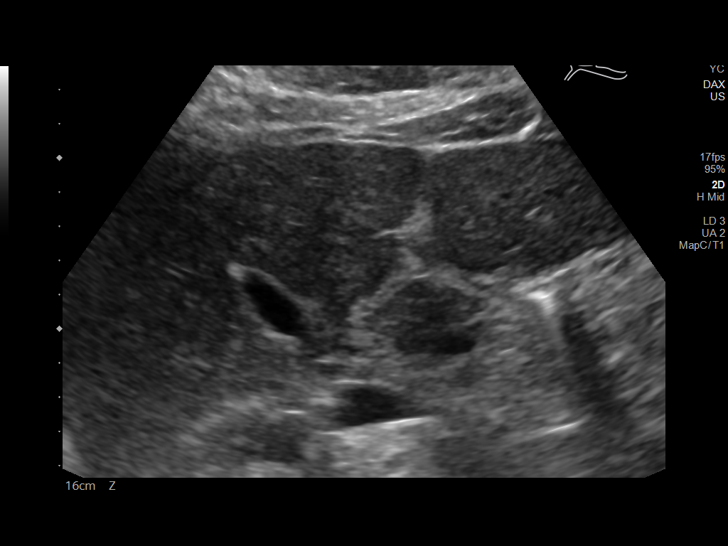
[im 15/57]
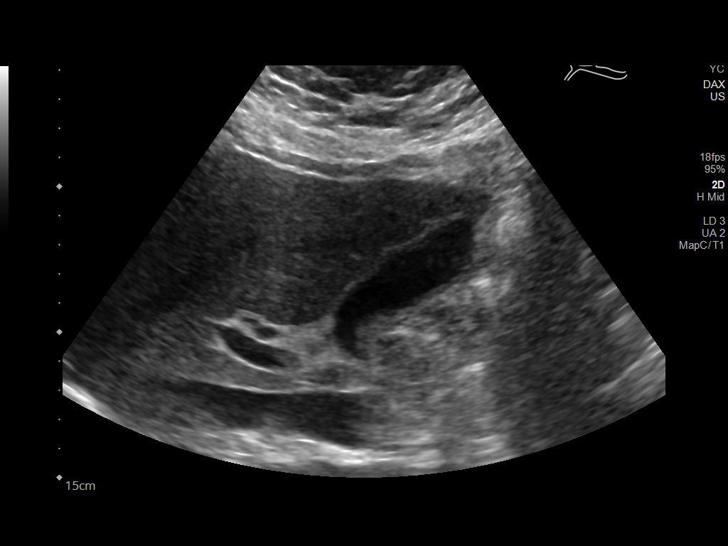
[im 19/57]
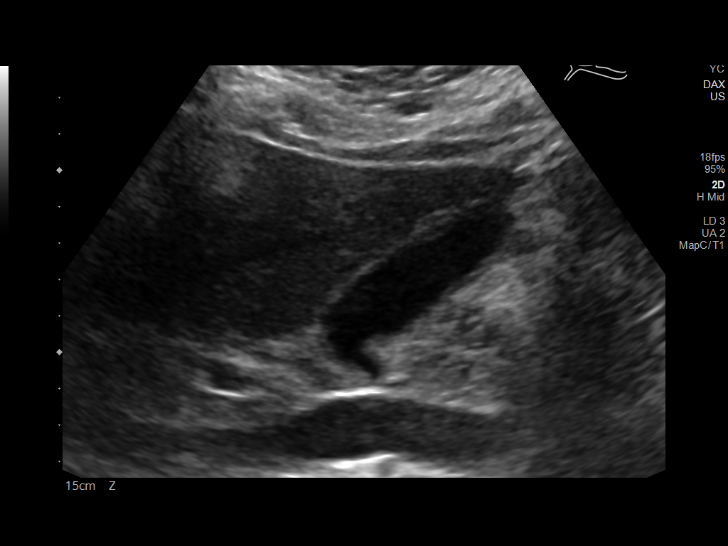
[im 22/57]
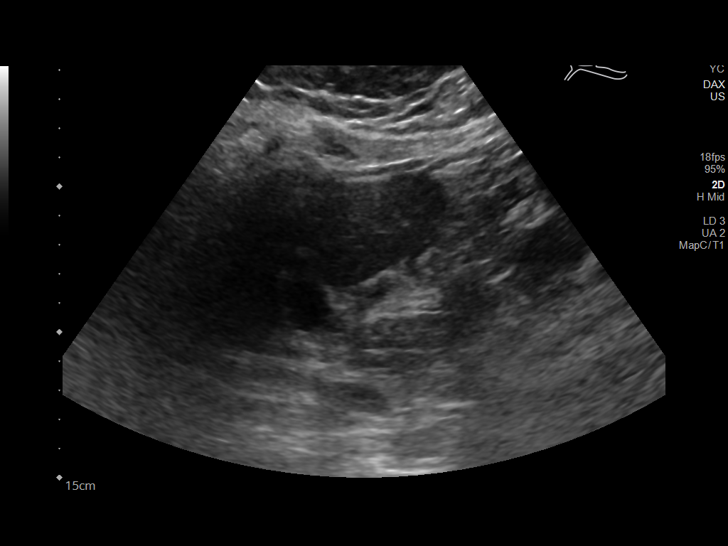
[im 26/57]
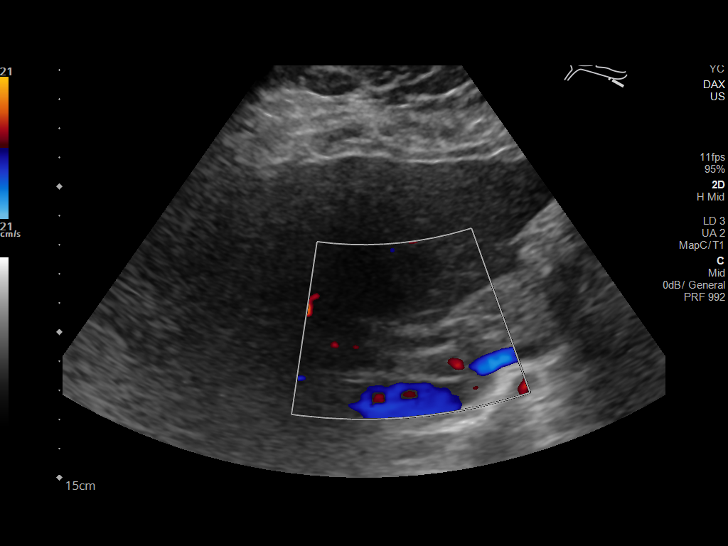
[im 31/57]
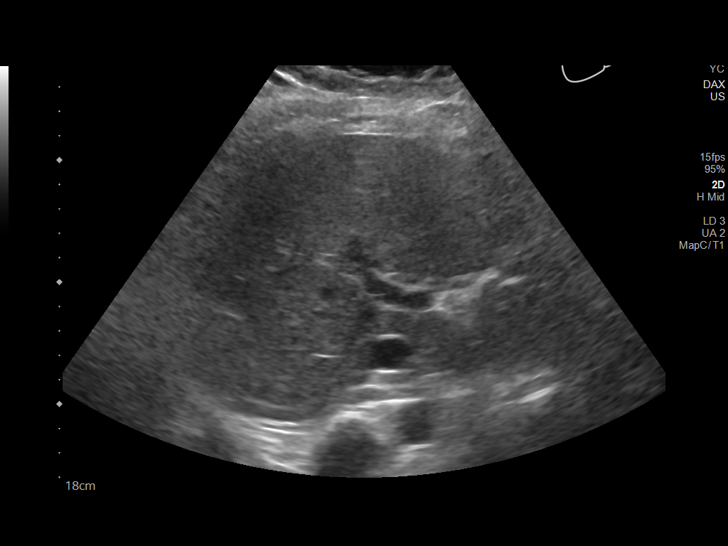
[im 36/57]
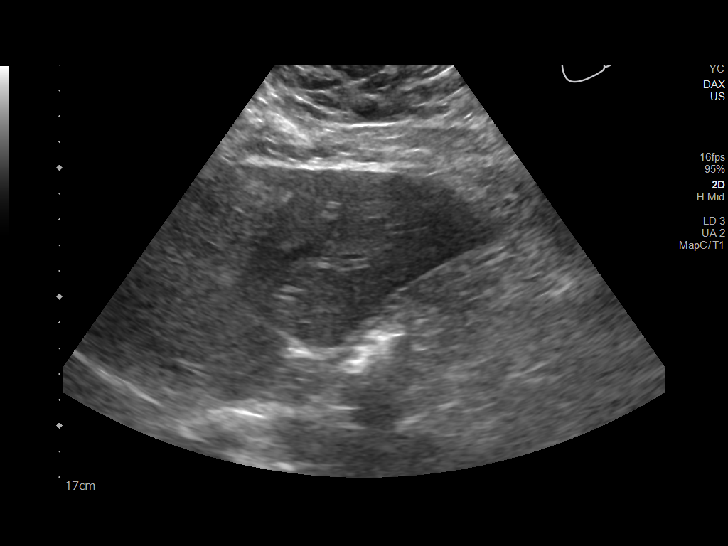
[im 38/57]
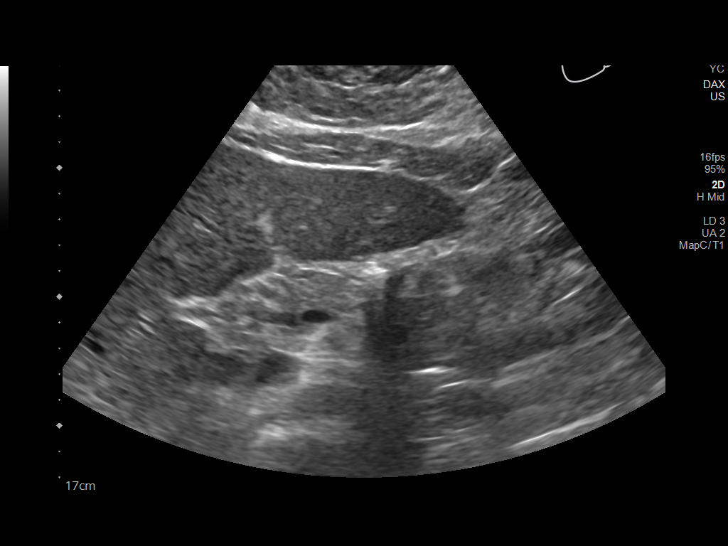
[im 43/57]
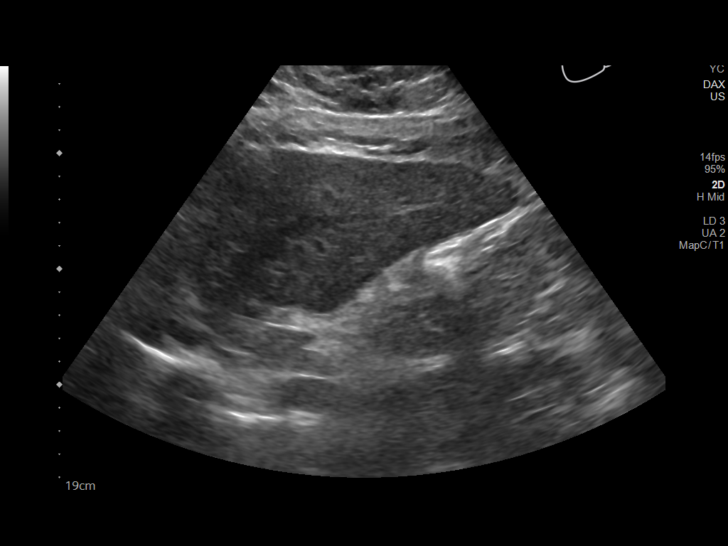
[im 47/57]
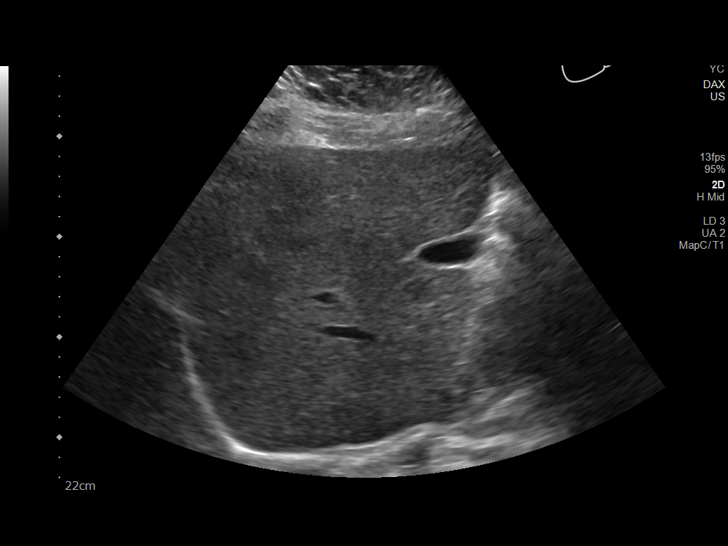
[im 52/57]
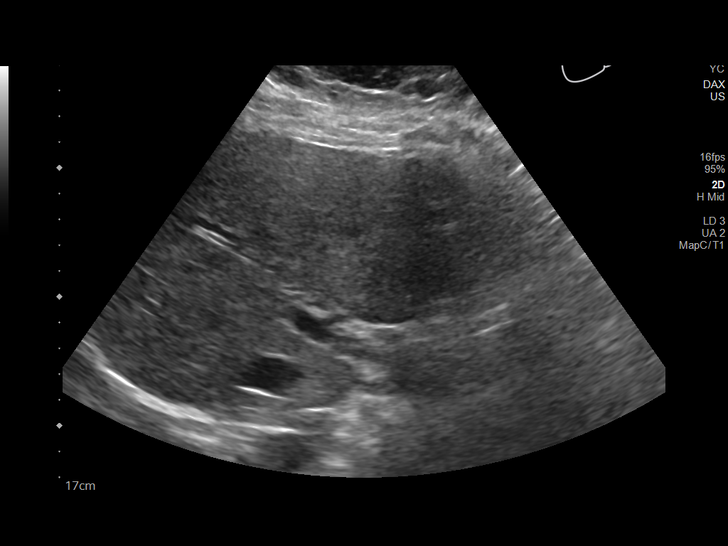
[im 57/57]
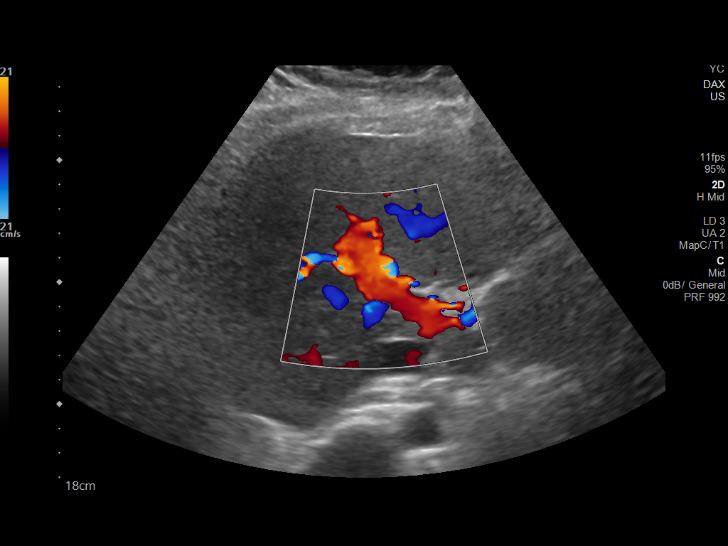

[14 of 25 positions shown; findings below may reference images not displayed]

FINDINGS: Gallbladder:

The gallbladder is poorly distended. The gallbladder wall measures
up to 5.2 mm. No stones, sludge, pericholecystic fluid, or Murphy's
sign.

Common bile duct:

Diameter: 4.7 mm

Liver:

Increased echogenicity with no focal mass. Portal vein is patent on
color Doppler imaging with normal direction of blood flow towards
the liver.
IMPRESSION: 1. The gallbladder wall is thickened, measuring up to 5.2 mm. 3 mm
is typically considered the upper limits of normal. The thickening
could be due to poor distention. No stones, sludge, pericholecystic
fluid, or Murphy's sign.
2. Probable hepatic steatosis.

## 2019-06-07 MED ORDER — MORPHINE SULFATE (PF) 4 MG/ML IV SOLN
4.0000 mg | Freq: Once | INTRAVENOUS | Status: AC
  Administered 2019-06-07: 4 mg via INTRAVENOUS
  Filled 2019-06-07: qty 1

## 2019-06-07 MED ORDER — HYDROCODONE-ACETAMINOPHEN 5-325 MG PO TABS: 1.0000 | ORAL_TABLET | Freq: Four times a day (QID) | ORAL | 0 refills | Status: DC | PRN

## 2019-06-07 MED ORDER — DOXYCYCLINE HYCLATE 100 MG PO CAPS: 100.0000 mg | ORAL_CAPSULE | Freq: Two times a day (BID) | ORAL | 0 refills | Status: DC

## 2019-06-07 MED ORDER — IOHEXOL 300 MG/ML  SOLN
100.0000 mL | Freq: Once | INTRAMUSCULAR | Status: AC | PRN
  Administered 2019-06-07: 100 mL via INTRAVENOUS

## 2019-06-07 MED ORDER — CEFTRIAXONE SODIUM 250 MG IJ SOLR
250.0000 mg | Freq: Once | INTRAMUSCULAR | Status: AC
  Administered 2019-06-07: 250 mg via INTRAMUSCULAR
  Filled 2019-06-07: qty 250

## 2019-06-07 MED ORDER — ONDANSETRON HCL 4 MG PO TABS: 4.0000 mg | ORAL_TABLET | Freq: Four times a day (QID) | ORAL | 0 refills | Status: DC

## 2019-06-07 MED ORDER — LACTATED RINGERS IV BOLUS
1000.0000 mL | Freq: Once | INTRAVENOUS | Status: AC
  Administered 2019-06-07: 1000 mL via INTRAVENOUS

## 2019-06-07 MED ORDER — DOXYCYCLINE HYCLATE 100 MG PO TABS
100.0000 mg | ORAL_TABLET | Freq: Once | ORAL | Status: AC
  Administered 2019-06-07: 100 mg via ORAL
  Filled 2019-06-07: qty 1

## 2019-06-07 MED ORDER — ONDANSETRON HCL 4 MG/2ML IJ SOLN
4.0000 mg | Freq: Once | INTRAMUSCULAR | Status: AC
  Administered 2019-06-07: 4 mg via INTRAVENOUS
  Filled 2019-06-07: qty 2

## 2019-06-07 MED ORDER — METOCLOPRAMIDE HCL 5 MG/ML IJ SOLN
10.0000 mg | Freq: Once | INTRAMUSCULAR | Status: AC
  Administered 2019-06-07: 10 mg via INTRAVENOUS
  Filled 2019-06-07: qty 2

## 2019-06-07 NOTE — ED Notes (Signed)
Urine culture collected and sent to main lab. 

## 2019-06-07 NOTE — ED Notes (Signed)
Patient verbalizes understanding of discharge instructions. Opportunity for questioning and answers were provided. Armband removed by staff, pt discharged from ED ambulatory to home.  

## 2019-06-07 NOTE — ED Provider Notes (Signed)
[facility] EMERGENCY DEPARTMENT Provider Note   CSN: [id number] Arrival date & time: 06/07/19  1435     History   Chief Complaint Chief Complaint  Patient presents with  . Abdominal Pain    HPI LASONDRA HODGKINS is a 28 y.o. female.     Patient is a 28 year old female with a history of obesity, hypertension, PID, C-section delivery in February who is presenting today with 2 weeks of worsening abdominal pain.  Patient states that for the last 2 weeks she has had abdominal pain that is been gradually worsening now with nausea, vomiting but no change in bowel movements.  Any type of movement seems to make the pain worse and it is currently a 10 out of 10.  She denies any urinary symptoms such as frequency, urgency or dysuria.  She did have her IUD removed yesterday because of ongoing spotting and pain but states they did not check her for sexually transmitted infections at that time.  She is currently sexually active with one partner and does not use protection regularly.  She denies cough, shortness of breath or congestion.  No recent medication changes.  The history is provided by the patient.    Past Medical History:  Diagnosis Date  . Chlamydia   . Depression    doing ok now  . Gestational diabetes   . Gonorrhea   . History of pregnancy induced hypertension 12/06/2016  . Hx of trichomoniasis   . Hypertension    sometimes is up, never on meds  . Mental disorder   . Ovarian cyst   . PID (pelvic inflammatory disease)   . Sleep apnea     Patient Active Problem List   Diagnosis Date Noted  . H/O: C-section 01/04/2019  . Cesarean delivery delivered 01/04/2019  . GBS (group B Streptococcus carrier), +RV culture, currently pregnant 12/23/2018  . GDM, class A2 08/12/2018  . Chronic hypertension during pregnancy, antepartum 07/30/2018  . Obesity in pregnancy 06/27/2018  . BMI 50.0-59.9, adult (HCC) 05/28/2018  . History of cesarean delivery affecting  pregnancy 10/03/2015  . Supervision of high risk pregnancy, antepartum 08/23/2015  . Severe major depression without psychotic features (HCC) 02/18/2014  . 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  . PTSD (post-traumatic stress disorder) 02/16/2014  . Sleep apnea 02/16/2014  . MDD (major depressive disorder), recurrent severe, 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    Past Surgical History:  Procedure Laterality Date  . CESAREAN SECTION N/A 10/03/2015   Procedure: CESAREAN SECTION;  Surgeon: Tilda Burrow, MD;  Location: [facility] ORS;  Service: Obstetrics;  Laterality: N/A;  . CESAREAN SECTION N/A 01/04/2019   Procedure: CESAREAN SECTION;  Surgeon: Hermina Staggers, MD;  Location: [facility] BIRTHING SUITES;  Service: Obstetrics;  Laterality: N/A;  . KNEE SURGERY       OB History    Gravida  2   Para  2   Term  2   Preterm      AB      Living  2     SAB      TAB      Ectopic      Multiple  0   Live Births  2            Home Medications    Prior to Admission medications   Medication Sig Start Date End Date Taking? Authorizing Provider  divalproex (DEPAKOTE ER) 500 MG 24 hr tablet Take 500 mg by mouth at bedtime. 03/28/19  Yes  [provider]  Levonorgest-Eth Estrad-Fe Bisg (BALCOLTRA) 0.1-20 MG-MCG(21) TABS Take 1 tablet by mouth daily. 06/05/19  Yes Shelly Bombard, MD  mirtazapine (REMERON) 15 MG tablet Take 15 mg by mouth at bedtime. 03/27/19  Yes [provider]  naproxen sodium (ALEVE) 220 MG tablet Take 220 mg by mouth 2 (two) times daily as needed (pain).   Yes [provider]  sertraline (ZOLOFT) 100 MG tablet Take 100 mg by mouth daily after breakfast. 03/28/19  Yes [provider]  acetaminophen (TYLENOL) 325 MG tablet Take 2 tablets (650 mg total) by mouth every 6 (six) hours as needed for mild pain or moderate pain. Patient not taking: Reported on 06/07/2019 01/07/19   Durwin Glaze, DO  ibuprofen (ADVIL,MOTRIN) 800 MG  tablet Take 1 tablet (800 mg total) by mouth every 6 (six) hours. Patient not taking: Reported on 06/07/2019 01/07/19   Jovita Gamma L, DO  senna-docusate (SENOKOT-S) 8.6-50 MG tablet Take 2 tablets by mouth daily. Patient not taking: Reported on 06/07/2019 01/08/19   Jovita Gamma L, DO  sertraline (ZOLOFT) 50 MG tablet Take 1 tablet (50 mg total) by mouth daily. Patient not taking: Reported on 06/07/2019 08/23/18   Donnamae Jude, MD    Family History Family History  Problem Relation Age of Onset  . Healthy Mother   . Healthy Father   . Healthy Brother   . Cancer Neg Hx   . Diabetes Neg Hx   . Heart disease Neg Hx   . Hypertension Neg Hx   . Stroke Neg Hx   . Hearing loss Neg Hx     Social History Social History   Tobacco Use  . Smoking status: Never Smoker  . Smokeless tobacco: Never Used  Substance Use Topics  . Alcohol use: No  . Drug use: No     Allergies   Nubain [nalbuphine hcl]   Review of Systems Review of Systems  All other systems reviewed and are negative.    Physical Exam Updated Vital Signs BP (!) 134/108 (BP Location: Left Arm)   Pulse 87   Temp 98.6 F (37 C) (Oral)   Resp 16   Ht 5' (1.524 m)   Wt 135.2 kg   SpO2 99%   BMI 58.20 kg/m   Physical Exam Vitals signs and nursing note reviewed.  Constitutional:      General: She is not in acute distress.    Appearance: She is well-developed. She is obese.  HENT:     Head: Normocephalic and atraumatic.  Eyes:     Pupils: Pupils are equal, round, and reactive to light.  Cardiovascular:     Rate and Rhythm: Normal rate and regular rhythm.     Heart sounds: Normal heart sounds. No murmur. No friction rub.  Pulmonary:     Effort: Pulmonary effort is normal.     Breath sounds: Normal breath sounds. No wheezing or rales.  Abdominal:     General: Bowel sounds are normal. There is no distension.     Palpations: Abdomen is soft.     Tenderness: There is abdominal tenderness in the  right upper quadrant, right lower quadrant, suprapubic area and left lower quadrant. There is guarding. There is no right CVA tenderness, left CVA tenderness or rebound.  Genitourinary:    Vagina: Vaginal discharge present. No bleeding.     Cervix: No cervical motion tenderness.     Adnexa: Left adnexa normal.       Right: Tenderness  present. No mass or fullness.         Left: No mass, tenderness or fullness.       Comments: Moderate amount of thick white discharge Musculoskeletal: Normal range of motion.        General: No tenderness.     Comments: No edema  Skin:    General: Skin is warm and dry.     Findings: No rash.  Neurological:     General: No focal deficit present.     Mental Status: She is alert and oriented to person, place, and time.     Cranial Nerves: No cranial nerve deficit.  Psychiatric:        Mood and Affect: Mood normal.        Behavior: Behavior normal.      ED Treatments / Results  Labs (all labs ordered are listed, but only abnormal results are displayed) Labs Reviewed  WET PREP, GENITAL - Abnormal; Notable for the following components:      Result Value   Yeast Wet Prep HPF POC PRESENT (*)    Clue Cells Wet Prep HPF POC PRESENT (*)    WBC, Wet Prep HPF POC FEW (*)    All other components within normal limits  COMPREHENSIVE METABOLIC PANEL - Abnormal; Notable for the following components:   AST 325 (*)    ALT 212 (*)    Alkaline Phosphatase 151 (*)    Total Bilirubin 2.1 (*)    All other components within normal limits  CBC - Abnormal; Notable for the following components:   RBC 5.30 (*)    All other components within normal limits  URINALYSIS, ROUTINE W REFLEX MICROSCOPIC - Abnormal; Notable for the following components:   Color, Urine AMBER (*)    APPearance HAZY (*)    Nitrite POSITIVE (*)    Bacteria, UA RARE (*)    All other components within normal limits  LIPASE, BLOOD  I-STAT BETA HCG BLOOD, ED ([facility], [facility], [facility] ONLY)  GC/CHLAMYDIA PROBE  AMP ([place]) NOT AT [facility]    EKG None  Radiology Ct Abdomen Pelvis W Contrast  Result Date: 06/07/2019 CLINICAL DATA:  Abdominal pain for 2 weeks EXAM: CT ABDOMEN AND PELVIS WITH CONTRAST TECHNIQUE: Multidetector CT imaging of the abdomen and pelvis was performed using the standard protocol following bolus administration of intravenous contrast. CONTRAST:  100mL OMNIPAQUE IOHEXOL 300 MG/ML  SOLN COMPARISON:  01/29/2018 FINDINGS: Lower chest: No acute abnormality. Hepatobiliary: No focal liver abnormality is seen. No gallstones, gallbladder wall thickening, or biliary dilatation. Pancreas: Unremarkable. No pancreatic ductal dilatation or surrounding inflammatory changes. Spleen: Normal in size without focal abnormality. Adrenals/Urinary Tract: Adrenal glands are within normal limits. Kidneys are well visualized bilaterally. No renal calculi or obstructive changes are seen. The ureters are well visualized bilaterally. The bladder is partially distended. Stomach/Bowel: The colon is predominately decompressed. No inflammatory or obstructive changes are seen. The appendix is within normal limits. No small bowel or gastric abnormality is seen. Vascular/Lymphatic: No significant vascular findings are present. No enlarged abdominal or pelvic lymph nodes. Stable small bilateral iliac chain lymph nodes are noted. Reproductive: Uterus and bilateral adnexa are unremarkable. Other: No ascites is identified. Small fat containing umbilical hernia is again noted. Musculoskeletal: No acute or significant osseous findings. IMPRESSION: No acute abnormality noted. Chronic changes as described above. Electronically Signed   By: Alcide CleverMark  Lukens M.D.   On: 06/07/2019 17:20   [place] Abdomen Limited Ruq  Result Date: 06/07/2019 CLINICAL DATA:  Right upper quadrant pain EXAM: ULTRASOUND ABDOMEN LIMITED RIGHT UPPER QUADRANT COMPARISON:  CT scan June 07, 2019 FINDINGS: Gallbladder: The gallbladder is poorly distended. The gallbladder  wall measures up to 5.2 mm. No stones, sludge, pericholecystic fluid, or Murphy's sign. Common bile duct: Diameter: 4.7 mm Liver: Increased echogenicity with no focal mass. Portal vein is patent on color Doppler imaging with normal direction of blood flow towards the liver. IMPRESSION: 1. The gallbladder wall is thickened, measuring up to 5.2 mm. 3 mm is typically considered the upper limits of normal. The thickening could be due to poor distention. No stones, sludge, pericholecystic fluid, or Murphy's sign. 2. Probable hepatic steatosis. Electronically Signed   By: Gerome Samavid  Williams III M.D   On: 06/07/2019 18:13    Procedures Procedures (including critical care time)  Medications Ordered in ED Medications  ondansetron (ZOFRAN) injection 4 mg (has no administration in time range)  morphine 4 MG/ML injection 4 mg (has no administration in time range)  lactated ringers bolus 1,000 mL (has no administration in time range)     Initial Impression / Assessment and Plan / ED Course  I have reviewed the triage vital signs and the nursing notes.  Pertinent labs & imaging results that were available during my care of the patient were reviewed by me and considered in my medical decision making (see chart for details).       Patient is a 28 year old female presenting with abdominal pain that is been worsening over the last 2 weeks.  Patient has significant lower abdominal tenderness but also some mild upper quadrant tenderness.  She is also complaining of nausea and vomiting and has not been able to eat for the last few days.  Patient's vital signs are within normal limits.  She did recently have an IUD removed yesterday and denies any vaginal discharge but has a history of PID, gonorrhea and chlamydia.  Discern for possible [employer] Corporation[employer]itz-Hugh Curtis, tubo-ovarian abscess, hepatitis, cholecystitis.  Lower suspicion for appendicitis or diverticulitis.  Also possibility for ovarian cyst.  Patient's labs today show a  transaminitis with elevated AST and ALT and elevated bilirubin but normal white count, lipase and hCG.  Urine is pending.  We will do a pelvic exam and then consider ultrasound for gallbladder or pelvic pathology versus CT.  Patient given IV fluids, pain and nausea control.  4:18 PM On patient's pelvic exam she has a moderate amount of some thick white discharge with a closed cervical office.  No bleeding noted.  No left adnexal tenderness but significant right adnexal tenderness.  Lipase, hCG within normal limits.  UA nitrite positive but otherwise no leukocytes or white blood cells and rare bacteria with low suspicion for UTI.  Based on having right adnexal and right upper quadrant tenderness will start with CT for further evaluation.  5:35 PM Patient's CAT scan is negative for acute findings.  Wet prep with yeast and clue cells.  Will proceed with ultrasound of the right upper quadrant and pelvic to further evaluate and ensure no acute pathology.   6:44 PM Pt pain is returning and was redosed with meds.  RUQ U/S with wall thickening of 5 mm but also poor distention.  No evidence of pericholecystic fluid, stones or sludge.  Pelvic us with a 6mm echogenic focus in the right adenexa but stated most likely benign and otherwise no acute findings.  Will treat for PID given hx and and exam.  Low suspicion that this is gallbladder.  Transaminitis may be  Fitz-hugh curtis but also may be related to fatty liver from morbid obesity.  Discussed results with pt and she was given pain control and doxycycline as well as rocephin.  Encouraged close f/u with OB/GYN by Wednesday or returning to the ER if not improving.  Final Clinical Impressions(s) / ED Diagnoses   Final diagnoses:  RUQ pain  PID (acute pelvic inflammatory disease)    ED Discharge Orders         Ordered    doxycycline (VIBRAMYCIN) 100 MG capsule  2 times daily     06/07/19 1952    ondansetron (ZOFRAN) 4 MG tablet  Every 6 hours     06/07/19  1952    HYDROcodone-acetaminophen (NORCO/VICODIN) 5-325 MG tablet  Every 6 hours PRN     06/07/19 1952           Gwyneth SproutPlunkett, Areej Tayler, MD 06/07/19 1952

## 2019-06-07 NOTE — ED Triage Notes (Signed)
Patient arrived to ED with complaints of abd pain for x2 weeks. Also states that she is lighthead, nauseous, weak, and has pink spotting. Patient had nexplanon implant removed yesterday ([id number]) due to complications.

## 2019-06-10 LAB — GC/CHLAMYDIA PROBE AMP (~~LOC~~) NOT AT ARMC
Chlamydia: NEGATIVE
Neisseria Gonorrhea: NEGATIVE

## 2019-06-19 ENCOUNTER — Ambulatory Visit: Payer: Medicaid Other | Admitting: Obstetrics

## 2019-11-12 ENCOUNTER — Other Ambulatory Visit: Payer: Medicaid Other

## 2019-11-14 ENCOUNTER — Other Ambulatory Visit: Payer: Self-pay | Admitting: Cardiology

## 2019-11-14 DIAGNOSIS — Z20822 Contact with and (suspected) exposure to covid-19: Secondary | ICD-10-CM

## 2019-11-15 LAB — NOVEL CORONAVIRUS, NAA: SARS-CoV-2, NAA: NOT DETECTED

## 2020-03-18 ENCOUNTER — Ambulatory Visit: Payer: Medicaid Other

## 2020-03-25 ENCOUNTER — Other Ambulatory Visit: Payer: Self-pay

## 2020-03-25 ENCOUNTER — Encounter (HOSPITAL_COMMUNITY): Payer: Self-pay | Admitting: Emergency Medicine

## 2020-03-25 ENCOUNTER — Emergency Department (HOSPITAL_COMMUNITY)
Admission: EM | Admit: 2020-03-25 | Discharge: 2020-03-25 | Disposition: A | Discharge status: Home / Self Care | Payer: Medicaid Other | Attending: Emergency Medicine | Admitting: Emergency Medicine

## 2020-03-25 DIAGNOSIS — R519 Headache, unspecified: Secondary | ICD-10-CM | POA: Diagnosis not present

## 2020-03-25 DIAGNOSIS — R1030 Lower abdominal pain, unspecified: Secondary | ICD-10-CM | POA: Insufficient documentation

## 2020-03-25 DIAGNOSIS — R1110 Vomiting, unspecified: Secondary | ICD-10-CM | POA: Diagnosis present

## 2020-03-25 DIAGNOSIS — Z79899 Other long term (current) drug therapy: Secondary | ICD-10-CM | POA: Diagnosis not present

## 2020-03-25 DIAGNOSIS — A599 Trichomoniasis, unspecified: Secondary | ICD-10-CM

## 2020-03-25 LAB — CBC
HCT: 44 % (ref 36.0–46.0)
Hemoglobin: 13.8 g/dL (ref 12.0–15.0)
MCH: 27 pg (ref 26.0–34.0)
MCHC: 31.4 g/dL (ref 30.0–36.0)
MCV: 85.9 fL (ref 80.0–100.0)
Platelets: 290 10*3/uL (ref 150–400)
RBC: 5.12 MIL/uL — ABNORMAL HIGH (ref 3.87–5.11)
RDW: 14.2 % (ref 11.5–15.5)
WBC: 10.2 10*3/uL (ref 4.0–10.5)
nRBC: 0 % (ref 0.0–0.2)

## 2020-03-25 LAB — WET PREP, GENITAL
Sperm: NONE SEEN
Yeast Wet Prep HPF POC: NONE SEEN

## 2020-03-25 LAB — COMPREHENSIVE METABOLIC PANEL
ALT: 17 U/L (ref 0–44)
AST: 15 U/L (ref 15–41)
Albumin: 3.6 g/dL (ref 3.5–5.0)
Alkaline Phosphatase: 84 U/L (ref 38–126)
Anion gap: 11 (ref 5–15)
BUN: 5 mg/dL — ABNORMAL LOW (ref 6–20)
CO2: 27 mmol/L (ref 22–32)
Calcium: 8.8 mg/dL — ABNORMAL LOW (ref 8.9–10.3)
Chloride: 102 mmol/L (ref 98–111)
Creatinine, Ser: 0.78 mg/dL (ref 0.44–1.00)
GFR calc Af Amer: >60 mL/min (ref >60)
GFR calc non Af Amer: >60 mL/min (ref >60)
Glucose, Bld: 78 mg/dL (ref 70–99)
Potassium: 3.9 mmol/L (ref 3.5–5.1)
Sodium: 140 mmol/L (ref 135–145)
Total Bilirubin: 0.5 mg/dL (ref 0.3–1.2)
Total Protein: 7.3 g/dL (ref 6.5–8.1)

## 2020-03-25 LAB — URINALYSIS, MICROSCOPIC (REFLEX)

## 2020-03-25 LAB — HIV ANTIBODY (ROUTINE TESTING W REFLEX): HIV Screen 4th Generation wRfx: NONREACTIVE

## 2020-03-25 LAB — URINALYSIS, ROUTINE W REFLEX MICROSCOPIC
Bilirubin Urine: NEGATIVE
Glucose, UA: NEGATIVE mg/dL
Ketones, ur: NEGATIVE mg/dL
Nitrite: NEGATIVE
Protein, ur: NEGATIVE mg/dL
Specific Gravity, Urine: 1.025 (ref 1.005–1.030)
pH: 6 (ref 5.0–8.0)

## 2020-03-25 LAB — I-STAT BETA HCG BLOOD, ED (MC, WL, AP ONLY): I-stat hCG, quantitative: <5 m[IU]/mL (ref <5)

## 2020-03-25 LAB — LIPASE, BLOOD: Lipase: 20 U/L (ref 11–51)

## 2020-03-25 MED ORDER — METRONIDAZOLE 500 MG PO TABS
2000.0000 mg | ORAL_TABLET | Freq: Once | ORAL | Status: AC
  Administered 2020-03-25: 2000 mg via ORAL
  Filled 2020-03-25: qty 4

## 2020-03-25 MED ORDER — LIDOCAINE HCL (PF) 1 % IJ SOLN
INTRAMUSCULAR | Status: AC
  Administered 2020-03-25: 5 mL
  Filled 2020-03-25: qty 5

## 2020-03-25 MED ORDER — AZITHROMYCIN 250 MG PO TABS
1000.0000 mg | ORAL_TABLET | Freq: Once | ORAL | Status: AC
  Administered 2020-03-25: 1000 mg via ORAL
  Filled 2020-03-25: qty 4

## 2020-03-25 MED ORDER — ACETAMINOPHEN 325 MG PO TABS
650.0000 mg | ORAL_TABLET | Freq: Once | ORAL | Status: AC
  Administered 2020-03-25: 650 mg via ORAL
  Filled 2020-03-25: qty 2

## 2020-03-25 MED ORDER — CEFTRIAXONE SODIUM 500 MG IJ SOLR
500.0000 mg | Freq: Once | INTRAMUSCULAR | Status: AC
  Administered 2020-03-25: 500 mg via INTRAMUSCULAR
  Filled 2020-03-25: qty 500

## 2020-03-25 MED ORDER — HYDROCODONE-ACETAMINOPHEN 5-325 MG PO TABS
1.0000 | ORAL_TABLET | Freq: Once | ORAL | Status: AC
  Administered 2020-03-25: 1 via ORAL
  Filled 2020-03-25: qty 1

## 2020-03-25 MED ORDER — ONDANSETRON 4 MG PO TBDP
8.0000 mg | ORAL_TABLET | Freq: Once | ORAL | Status: AC
  Administered 2020-03-25: 8 mg via ORAL
  Filled 2020-03-25: qty 2

## 2020-03-25 NOTE — ED Notes (Signed)
Patient verbalizes understanding of discharge instructions. Opportunity for questioning and answers were provided. Armband removed by staff, pt discharged from ED.  

## 2020-03-25 NOTE — ED Triage Notes (Signed)
Pt reports not being able to keep anything down for 3 days with lower abd pan. Endorses HA since yesterday.

## 2020-03-25 NOTE — ED Provider Notes (Signed)
[facility] EMERGENCY DEPARTMENT Provider Note   CSN: [id number] Arrival date & time: 03/25/20  1359     History Chief Complaint  Patient presents with  . Emesis    Alexandra Gray is a 29 y.o. female with past medical history significant for STDs, ovarian cyst, PID presenting to emergency department today with chief complaint of emesis x3 days.  Patient is also endorsing suprapubic pain she states the pain is intermittent she describes it as a cramping sensation.  She rates the pain 4 out of 10 in severity.  Patient states she had a headache since yesterday.  The headache has progressively worsened since onset.  It feels like her typical headache.  She denies any associated neck pain.  She admits to unprotected intercourse x1 month ago. She denies fever, syncope, head trauma, photophobia, phonophobia, UL throbbing, N/V, visual changes, stiff neck, neck pain, rash, or "thunderclap" onset.  She is also denying any vaginal discharge or abnormal vaginal bleeding.  History provided by patient with additional history obtained from chart review.      Past Medical History:  Diagnosis Date  . Chlamydia   . Depression    doing ok now  . Gestational diabetes   . Gonorrhea   . History of pregnancy induced hypertension 12/06/2016  . Hx of trichomoniasis   . Hypertension    sometimes is up, never on meds  . Mental disorder   . Ovarian cyst   . PID (pelvic inflammatory disease)   . Sleep apnea     Patient Active Problem List   Diagnosis Date Noted  . H/O: C-section 01/04/2019  . Cesarean delivery delivered 01/04/2019  . GBS (group B Streptococcus carrier), +RV culture, currently pregnant 12/23/2018  . GDM, class A2 08/12/2018  . Chronic hypertension during pregnancy, antepartum 07/30/2018  . Obesity in pregnancy 06/27/2018  . BMI 50.0-59.9, adult (HCC) 05/28/2018  . History of cesarean delivery affecting pregnancy 10/03/2015  . Supervision of high risk pregnancy,  antepartum 08/23/2015  . Severe major depression without psychotic features (HCC) 02/18/2014  . 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  . PTSD (post-traumatic stress disorder) 02/16/2014  . Sleep apnea 02/16/2014  . MDD (major depressive disorder), recurrent severe, 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    Past Surgical History:  Procedure Laterality Date  . CESAREAN SECTION N/A 10/03/2015   Procedure: CESAREAN SECTION;  Surgeon: Tilda Burrow, MD;  Location: [facility];  Service: Obstetrics;  Laterality: N/A;  . CESAREAN SECTION N/A 01/04/2019   Procedure: CESAREAN SECTION;  Surgeon: Hermina Staggers, MD;  Location: [facility] BIRTHING SUITES;  Service: Obstetrics;  Laterality: N/A;  . KNEE SURGERY       OB History    Gravida  2   Para  2   Term  2   Preterm      AB      Living  2     SAB      TAB      Ectopic      Multiple  0   Live Births  2           Family History  Problem Relation Age of Onset  . Healthy Mother   . Healthy Father   . Healthy Brother   . Cancer Neg Hx   . Diabetes Neg Hx   . Heart disease Neg Hx   . Hypertension Neg Hx   . Stroke Neg Hx   . Hearing loss Neg Hx  Social History   Tobacco Use  . Smoking status: Never Smoker  . Smokeless tobacco: Never Used  Substance Use Topics  . Alcohol use: No  . Drug use: No    Home Medications Prior to Admission medications   Medication Sig Start Date End Date Taking? Authorizing Provider  acetaminophen (TYLENOL) 325 MG tablet Take 2 tablets (650 mg total) by mouth every 6 (six) hours as needed for mild pain or moderate pain. Patient not taking: Reported on 06/07/2019 01/07/19   Jamie Brookes L, DO  divalproex (DEPAKOTE ER) 500 MG 24 hr tablet Take 500 mg by mouth at bedtime. 03/28/19   [provider]  doxycycline (VIBRAMYCIN) 100 MG capsule Take 1 capsule (100 mg total) by mouth 2 (two) times daily. 06/07/19   Gwyneth Sprout, MD  HYDROcodone-acetaminophen (NORCO/VICODIN)  5-325 MG tablet Take 1 tablet by mouth every 6 (six) hours as needed. 06/07/19   Gwyneth Sprout, MD  ibuprofen (ADVIL,MOTRIN) 800 MG tablet Take 1 tablet (800 mg total) by mouth every 6 (six) hours. Patient not taking: Reported on 06/07/2019 01/07/19   Jamie Brookes L, DO  Levonorgest-Eth Estrad-Fe Bisg (BALCOLTRA) 0.1-20 MG-MCG(21) TABS Take 1 tablet by mouth daily. 06/05/19   Brock Bad, MD  mirtazapine (REMERON) 15 MG tablet Take 15 mg by mouth at bedtime. 03/27/19   [provider]  naproxen sodium (ALEVE) 220 MG tablet Take 220 mg by mouth 2 (two) times daily as needed (pain).    [provider]  ondansetron (ZOFRAN) 4 MG tablet Take 1 tablet (4 mg total) by mouth every 6 (six) hours. 06/07/19   Gwyneth Sprout, MD  senna-docusate (SENOKOT-S) 8.6-50 MG tablet Take 2 tablets by mouth daily. Patient not taking: Reported on 06/07/2019 01/08/19   Standley Brooking, DO  sertraline (ZOLOFT) 100 MG tablet Take 100 mg by mouth daily after breakfast. 03/28/19   [provider]  sertraline (ZOLOFT) 50 MG tablet Take 1 tablet (50 mg total) by mouth daily. Patient not taking: Reported on 06/07/2019 08/23/18   Reva Bores, MD    Allergies    Nubain [nalbuphine hcl]  Review of Systems   Review of Systems  All other systems are reviewed and are negative for acute change except as noted in the HPI.   Physical Exam Updated Vital Signs BP (!) 167/106 (BP Location: Left Wrist)   Pulse 90   Temp 98.9 F (37.2 C) (Oral)   Resp 18   Ht 5' (1.524 m)   Wt 117.9 kg   SpO2 99%   BMI 50.78 kg/m   Physical Exam Vitals and nursing note reviewed.  Constitutional:      General: She is not in acute distress.    Appearance: She is not ill-appearing.  HENT:     Head: Normocephalic and atraumatic.     Comments: No sinus or temporal tenderness.    Right Ear: Tympanic membrane and external ear normal.     Left Ear: Tympanic membrane and external ear normal.     Nose:  Nose normal.     Mouth/Throat:     Mouth: Mucous membranes are moist.     Pharynx: Oropharynx is clear.  Eyes:     General: No scleral icterus.       Right eye: No discharge.        Left eye: No discharge.     Extraocular Movements: Extraocular movements intact.     Conjunctiva/sclera: Conjunctivae normal.     Pupils: Pupils are equal,  round, and reactive to light.  Neck:     Vascular: No JVD.     Comments: Full ROM intact without spinous process TTP. No bony stepoffs or deformities, no paraspinous muscle TTP or muscle spasms. No rigidity or meningeal signs. No bruising, erythema, or swelling.  Cardiovascular:     Rate and Rhythm: Normal rate and regular rhythm.     Pulses: Normal pulses.          Radial pulses are 2+ on the right side and 2+ on the left side.     Heart sounds: Normal heart sounds.  Pulmonary:     Comments: Lungs clear to auscultation in all fields. Symmetric chest rise. No wheezing, rales, or rhonchi. Abdominal:     General: Bowel sounds are normal.     Tenderness: There is no right CVA tenderness or left CVA tenderness.     Comments: Abdomen is soft, non-distended, mild suprapubic tenderness. No rigidity, no guarding. No peritoneal signs.  Genitourinary:    Comments: Normal external genitalia. No pain with speculum insertion. Closed cervical os with normal appearance - no rash or lesions.  Thin yellow discharge seen in vaginal vault.  No significant bleeding noted from cervix or in vaginal vault. On bimanual examination no adnexal tenderness or cervical motion tenderness. Chaperone Bosie Clos EMT present during exam. Musculoskeletal:        General: Normal range of motion.     Cervical back: Normal range of motion.  Skin:    General: Skin is warm and dry.     Capillary Refill: Capillary refill takes less than 2 seconds.  Neurological:     Mental Status: She is oriented to person, place, and time.     GCS: GCS eye subscore is 4. GCS verbal subscore is 5. GCS motor  subscore is 6.     Comments: Speech is clear and goal oriented, follows commands CN III-XII intact, no facial droop Normal strength in upper and lower extremities bilaterally including dorsiflexion and plantar flexion, strong and equal grip strength Sensation normal to light and sharp touch Moves extremities without ataxia, coordination intact Normal finger to nose and rapid alternating movements Normal gait and balance   Psychiatric:        Behavior: Behavior normal.     ED Results / Procedures / Treatments   Labs (all labs ordered are listed, but only abnormal results are displayed) Labs Reviewed  WET PREP, GENITAL - Abnormal; Notable for the following components:      Result Value   Trich, Wet Prep PRESENT (*)    Clue Cells Wet Prep HPF POC PRESENT (*)    WBC, Wet Prep HPF POC MANY (*)    All other components within normal limits  COMPREHENSIVE METABOLIC PANEL - Abnormal; Notable for the following components:   BUN 5 (*)    Calcium 8.8 (*)    All other components within normal limits  CBC - Abnormal; Notable for the following components:   RBC 5.12 (*)    All other components within normal limits  URINALYSIS, ROUTINE W REFLEX MICROSCOPIC - Abnormal; Notable for the following components:   Hgb urine dipstick TRACE (*)    Leukocytes,Ua MODERATE (*)    All other components within normal limits  URINALYSIS, MICROSCOPIC (REFLEX) - Abnormal; Notable for the following components:   Bacteria, UA FEW (*)    All other components within normal limits  URINE CULTURE  LIPASE, BLOOD  RPR  HIV ANTIBODY (ROUTINE TESTING W REFLEX)  I-STAT BETA  HCG BLOOD, ED ([facility], [facility], [facility] ONLY)  GC/CHLAMYDIA PROBE AMP ([place]) NOT AT [facility]    EKG None  Radiology No results found.  Procedures Procedures (including critical care time)  Medications Ordered in ED Medications  cefTRIAXone (ROCEPHIN) injection 500 mg (500 mg Intramuscular Given 03/25/20 2008)  azithromycin (ZITHROMAX) tablet  1,000 mg (1,000 mg Oral Given 03/25/20 2006)  metroNIDAZOLE (FLAGYL) tablet 2,000 mg (2,000 mg Oral Given 03/25/20 2005)  ondansetron (ZOFRAN-ODT) disintegrating tablet 8 mg (8 mg Oral Given 03/25/20 2007)  acetaminophen (TYLENOL) tablet 650 mg (650 mg Oral Given 03/25/20 2007)  lidocaine (PF) (XYLOCAINE) 1 % injection (5 mLs  Given 03/25/20 2008)  HYDROcodone-acetaminophen (NORCO/VICODIN) 5-325 MG per tablet 1 tablet (1 tablet Oral Given 03/25/20 2140)    ED Course  I have reviewed the triage vital signs and the nursing notes.  Pertinent labs & imaging results that were available during my care of the patient were reviewed by me and considered in my medical decision making (see chart for details).    MDM Rules/Calculators/A&P                      29 yo female presents with suprapubic pain and headache.  She is afebrile, hemodynamically stable, no tachycardia, no hypoxia.  On exam patient is very well-appearing.  She has mild tenderness to palpation of suprapubic area, no peritoneal signs.  Neuro exam is normal.  No sinus tenderness.  Pelvic exam performed and shows thin yellow discharge in vaginal vault.   Labs were collected in triage.  I viewed results which show no leukocytosis, no anemia.  No severe electrolyte derangement, no renal insufficiency, normal liver enzymes.  Lipase is normal.  Pregnancy test is negative.  UA shows moderate leukocytes, 11-20 WBC, few bacteria.  Patient denies any urinary symptoms, will send for urine culture.Wet prep is positive for trichomoniasis, clue cells, WBC.  She is tolerating p.o. intake here in the department, serial abdominal exams have been benign.  No indications of acute surgical abdomen. Pt has been treated prophylacticly with azithromycin and rocephin due to pts history, pelvic exam, and wet prep with increased WBCs. Pt not concerning for PID because hemodynamically stable and no cervical motion tenderness on pelvic exam.  Patient aware she needs to  inform partner of positive test result.  Patient to be discharged with instructions to follow up with OBGYN/PCP. Discussed importance of using protection when sexually active.   The patient appears reasonably screened and/or stabilized for discharge and I doubt any other medical condition or other [facility] requiring further screening, evaluation, or treatment in the ED at this time prior to discharge.    Portions of this note were generated with [employer]. Dictation errors may occur despite best attempts at proofreading.    Final Clinical Impression(s) / ED Diagnoses Final diagnoses:  Trichimoniasis    Rx / DC Orders ED Discharge Orders    None       Flint Melter 03/25/20 2144    Lucrezia Starch, MD 03/26/20 1946

## 2020-03-25 NOTE — Discharge Instructions (Addendum)
You have been seen today in the Emergency Department (ED) for abdominal pain Your workup today shows that you had one or more sexually transmitted infections (STIs).  You tested positive for trichomoniasis today.  Your gonorrhea and Chlamydia cultures are pending.  You will receive a phone call if they are positive.  You will need to let your partner know those test results as well.   You have been treated with a one time dose medication for both of these conditions.  Please have your partner tested for STD's and do not resume sexual activity until you and your partner have confirmed negative test results or have both been treated.  Please follow up with your doctor as soon as possible regarding today's ED visit and your symptoms.   Return to the ED if your pain worsens, you develop a fever, or for any other symptoms that concern you.

## 2020-03-26 ENCOUNTER — Ambulatory Visit: Payer: Medicaid Other | Admitting: Obstetrics

## 2020-03-26 LAB — GC/CHLAMYDIA PROBE AMP (~~LOC~~) NOT AT ARMC
Chlamydia: NEGATIVE
Comment: NEGATIVE
Comment: NORMAL
Neisseria Gonorrhea: NEGATIVE

## 2020-03-26 LAB — URINE CULTURE

## 2020-03-26 LAB — RPR: RPR Ser Ql: NONREACTIVE

## 2020-05-08 ENCOUNTER — Encounter (HOSPITAL_COMMUNITY): Payer: Self-pay

## 2020-05-08 ENCOUNTER — Other Ambulatory Visit: Payer: Self-pay

## 2020-05-08 ENCOUNTER — Emergency Department (HOSPITAL_COMMUNITY)
Admission: EM | Admit: 2020-05-08 | Discharge: 2020-05-09 | Disposition: A | Discharge status: Home / Self Care | Payer: Medicaid Other | Attending: Emergency Medicine | Admitting: Emergency Medicine

## 2020-05-08 ENCOUNTER — Emergency Department (HOSPITAL_COMMUNITY): Payer: Medicaid Other

## 2020-05-08 DIAGNOSIS — M25572 Pain in left ankle and joints of left foot: Secondary | ICD-10-CM | POA: Diagnosis not present

## 2020-05-08 DIAGNOSIS — Z79899 Other long term (current) drug therapy: Secondary | ICD-10-CM | POA: Insufficient documentation

## 2020-05-08 DIAGNOSIS — M25562 Pain in left knee: Secondary | ICD-10-CM

## 2020-05-08 IMAGING — CR DG KNEE COMPLETE 4+V*L*
4 series · 4 of 4 positions shown · non-contrast
Comparison: None.

CLINICAL DATA: Left knee pain after injury.  Fall 3 days ago.

EXAM:
LEFT KNEE - COMPLETE 4+ VIEW

[knee ap]
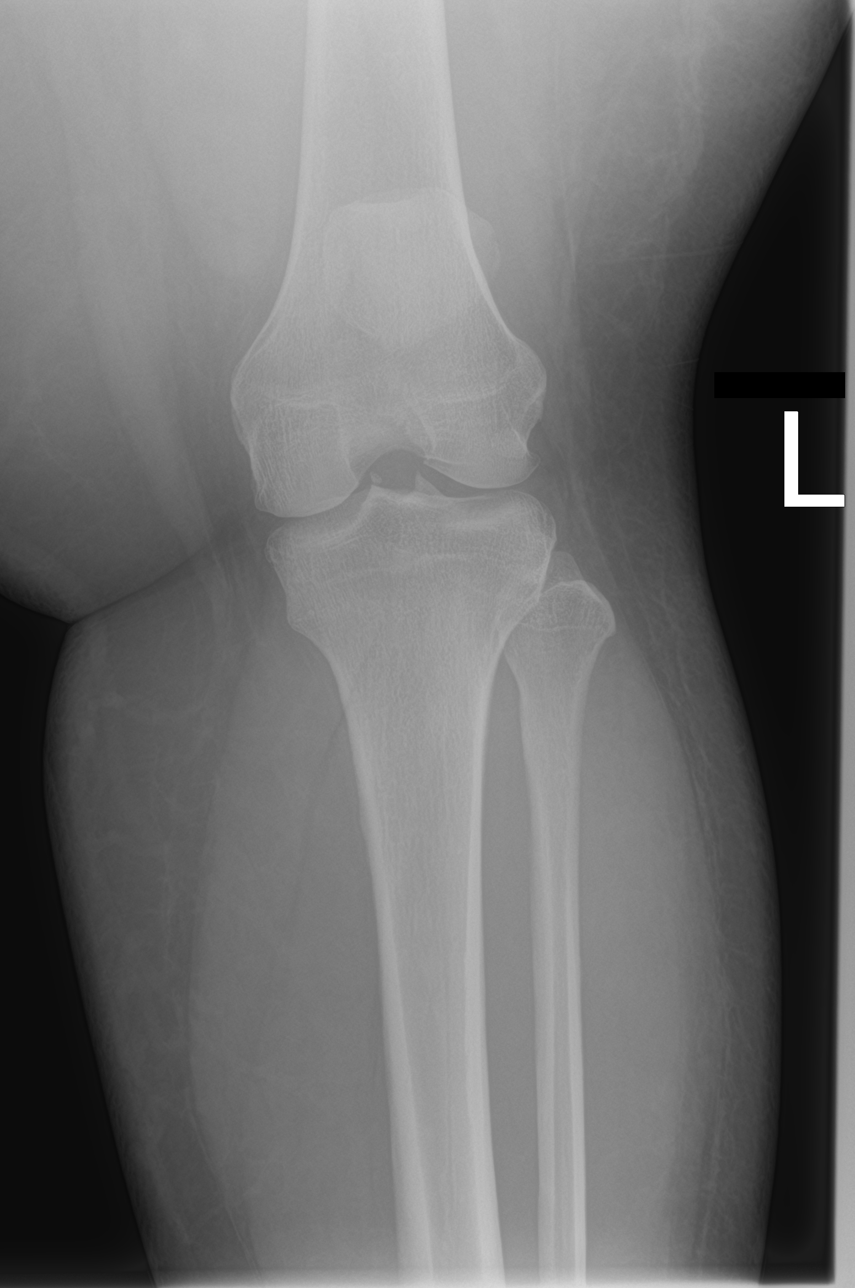

[knee lat]
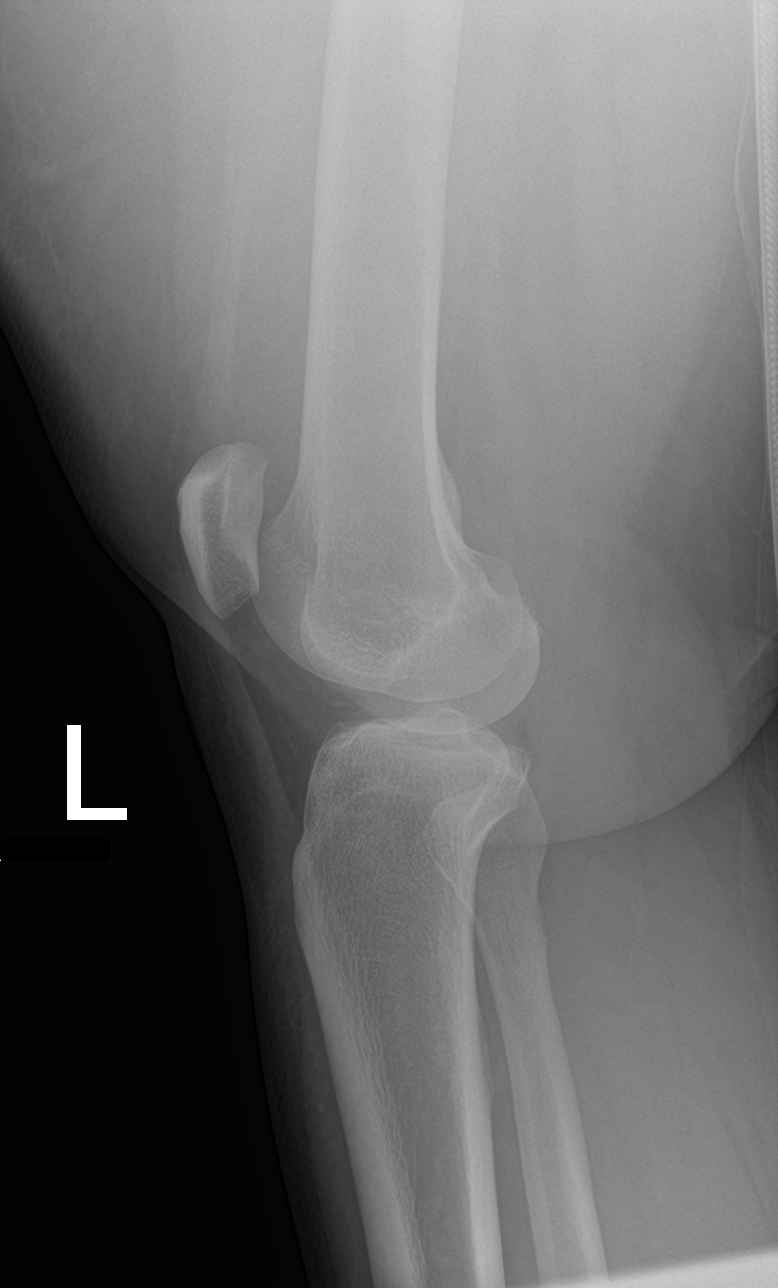

[knee obl (1 of 2)]
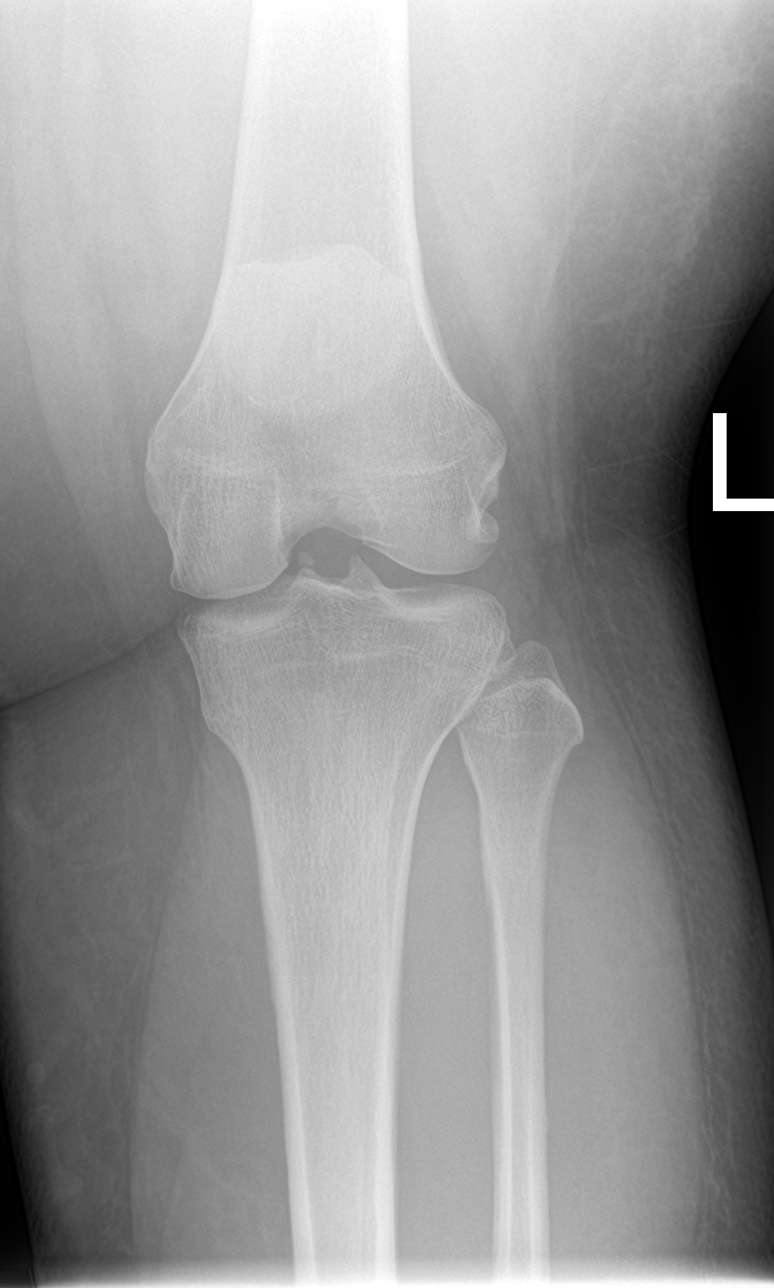

[knee obl (2 of 2)]
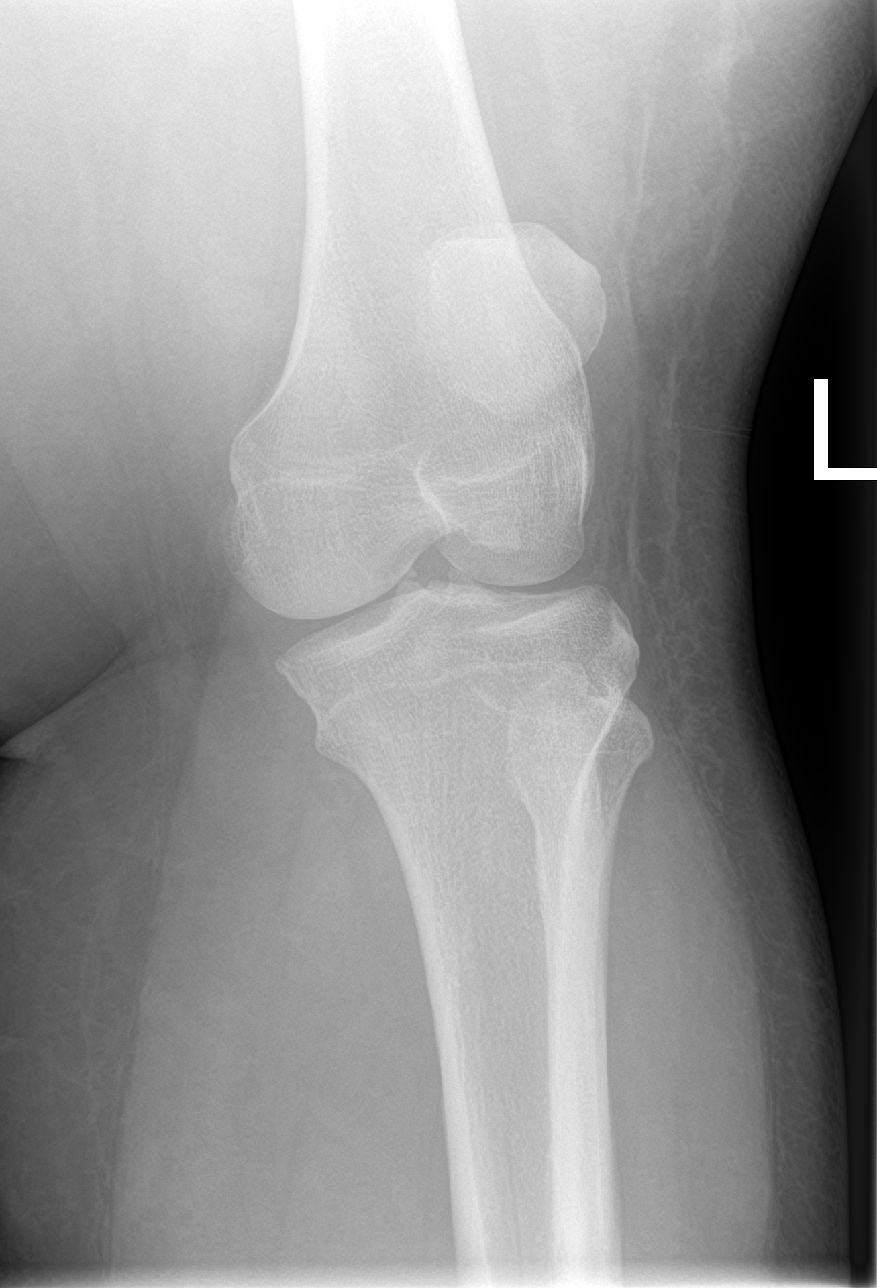

[4 of 4 positions shown; findings below may reference images not displayed]

FINDINGS: No evidence of fracture, dislocation, or joint effusion. Early
degenerative spurring of the medial tibiofemoral compartment in the
tibial spines. Well corticated calcification adjacent to the lateral
tibial spine is chronic. Soft tissues are unremarkable.
IMPRESSION: 1. No acute fracture or subluxation of the left knee.
2. Early degenerative spurring of the medial tibiofemoral
compartment.

## 2020-05-08 IMAGING — DX DG ANKLE COMPLETE 3+V*L*
3 series · 3 of 3 positions shown · non-contrast
Comparison: None.

CLINICAL DATA: Fall 3 days ago with persistent ankle pain, initial
encounter

EXAM:
LEFT ANKLE COMPLETE - 3+ VIEW

[ankle obl]
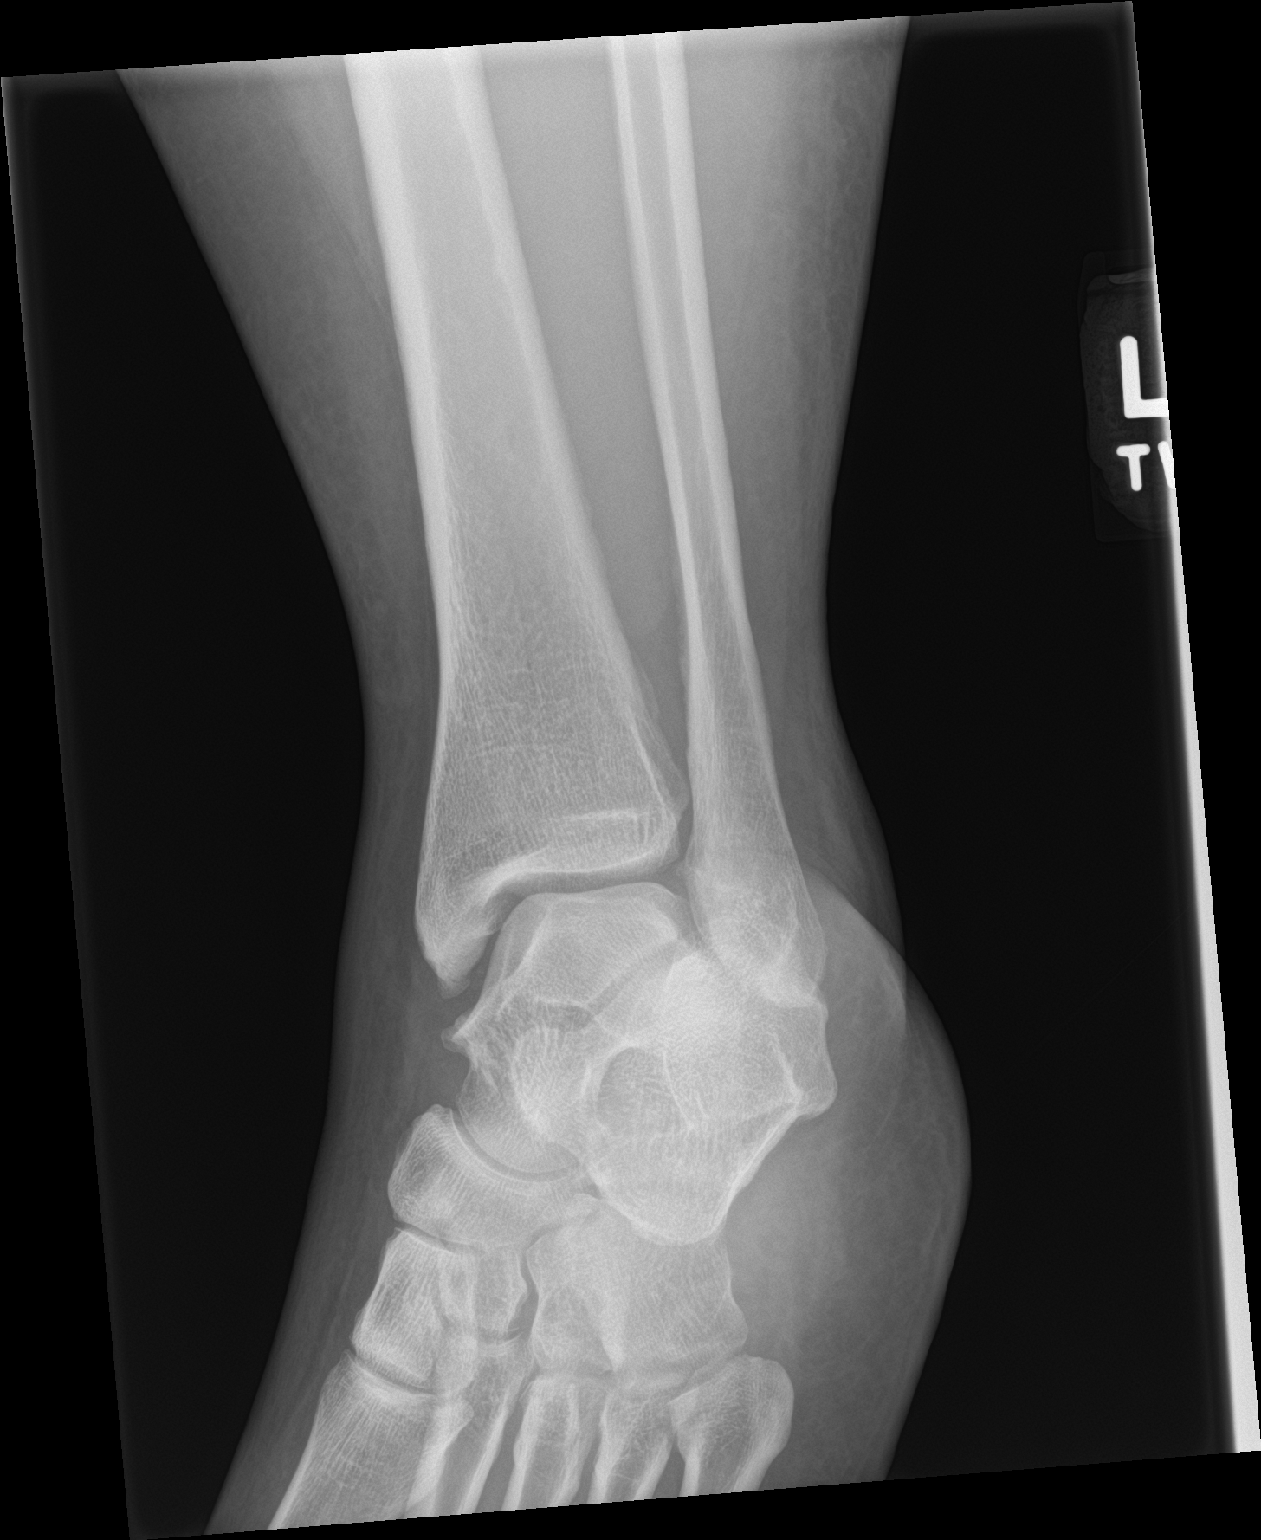

[ankle lat]
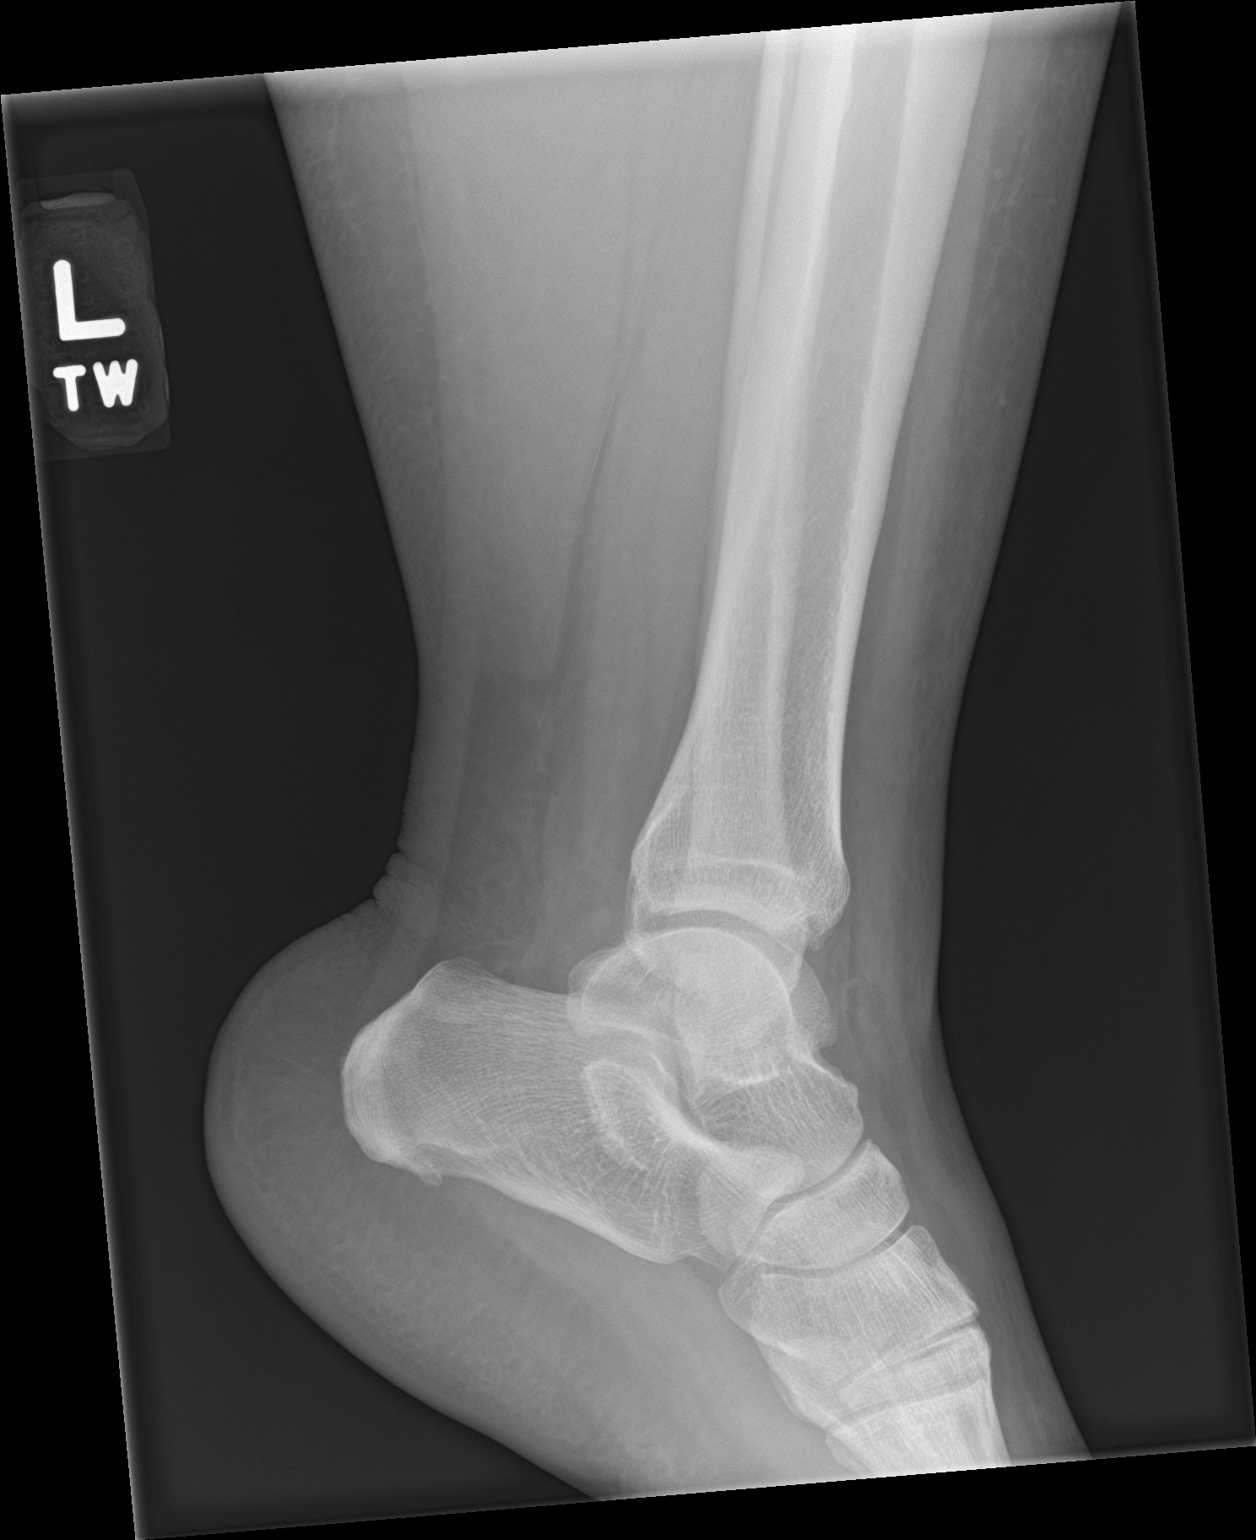

[ankle ap]
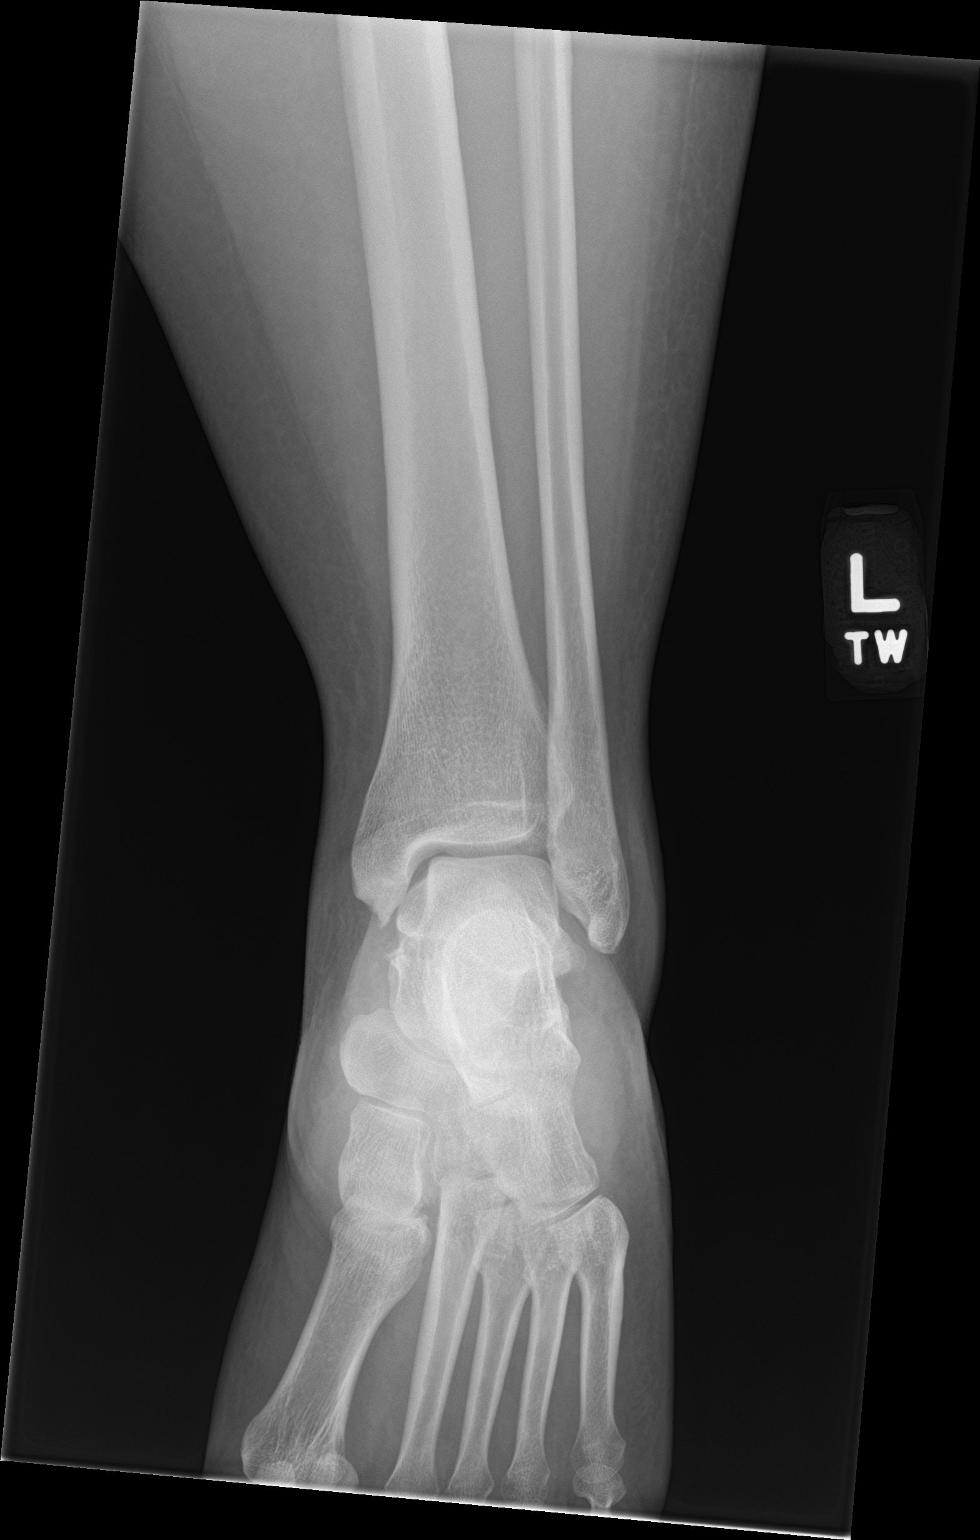

[3 of 3 positions shown; findings below may reference images not displayed]

FINDINGS: Degenerative changes of the tibiotalar joint are noted. No acute
fracture or dislocation is seen. No significant soft tissue swelling
is noted.
IMPRESSION: No acute abnormality noted.

## 2020-05-08 MED ORDER — NAPROXEN 250 MG PO TABS
500.0000 mg | ORAL_TABLET | Freq: Once | ORAL | Status: AC
  Administered 2020-05-08: 500 mg via ORAL
  Filled 2020-05-08: qty 2

## 2020-05-08 MED ORDER — ACETAMINOPHEN 500 MG PO TABS
500.0000 mg | ORAL_TABLET | Freq: Once | ORAL | Status: AC
  Administered 2020-05-08: 500 mg via ORAL
  Filled 2020-05-08: qty 1

## 2020-05-08 NOTE — Discharge Instructions (Addendum)
Please continue to take acetaminophen for management of your ongoing pain.  Please follow instructions on the bottle.  If your symptoms do not improve in 1 to 2 weeks please follow-up with Dr. Farris Has.  If your symptoms worsen you can always return to the emergency department for reevaluation.  It was a pleasure to meet you.

## 2020-05-08 NOTE — ED Provider Notes (Signed)
Alexandra Gray [facility] EMERGENCY DEPARTMENT Provider Note   CSN: [id number] Arrival date & time: 05/08/20  1343     History Chief Complaint  Patient presents with  . Knee Pain    Alexandra Gray is a 29 y.o. female.  HPI Patient is a 29 year old female that presents with left knee and ankle pain.  Patient states 2 weeks ago she slipped in a puddle of water and landed on her left knee.  She states she has been experiencing worsening 10/10 left knee pain.  She has not taken anything for symptoms.  Her pain worsens with ambulation.  3 days ago she inverted her left ankle resulting in circumferential pain around the left ankle with mild edema.  Her pain worsens with ambulation as well.  She denies numbness, tingling, weakness, leg swelling, calf swelling, calf pain.     Past Medical History:  Diagnosis Date  . Chlamydia   . Depression    doing ok now  . Gestational diabetes   . Gonorrhea   . History of pregnancy induced hypertension 12/06/2016  . Hx of trichomoniasis   . Hypertension    sometimes is up, never on meds  . Mental disorder   . Ovarian cyst   . PID (pelvic inflammatory disease)   . Sleep apnea     Patient Active Problem List   Diagnosis Date Noted  . H/O: C-section 01/04/2019  . Cesarean delivery delivered 01/04/2019  . GBS (group B Streptococcus carrier), +RV culture, currently pregnant 12/23/2018  . GDM, class A2 08/12/2018  . Chronic hypertension during pregnancy, antepartum 07/30/2018  . Obesity in pregnancy 06/27/2018  . BMI 50.0-59.9, adult (HCC) 05/28/2018  . History of cesarean delivery affecting pregnancy 10/03/2015  . Supervision of high risk pregnancy, antepartum 08/23/2015  . Severe major depression without psychotic features (HCC) 02/18/2014  . 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  . PTSD (post-traumatic stress disorder) 02/16/2014  . Sleep apnea 02/16/2014  . MDD (major depressive disorder), recurrent severe, without  psychosis (HCC) 08/04/2013    Past Surgical History:  Procedure Laterality Date  . CESAREAN SECTION N/A 10/03/2015   Procedure: CESAREAN SECTION;  Surgeon: Tilda Burrow, MD;  Location: [facility];  Service: Obstetrics;  Laterality: N/A;  . CESAREAN SECTION N/A 01/04/2019   Procedure: CESAREAN SECTION;  Surgeon: Hermina Staggers, MD;  Location: [facility] BIRTHING SUITES;  Service: Obstetrics;  Laterality: N/A;  . KNEE SURGERY       OB History    Gravida  2   Para  2   Term  2   Preterm      AB      Living  2     SAB      TAB      Ectopic      Multiple  0   Live Births  2           Family History  Problem Relation Age of Onset  . Healthy Mother   . Healthy Father   . Healthy Brother   . Cancer Neg Hx   . Diabetes Neg Hx   . Heart disease Neg Hx   . Hypertension Neg Hx   . Stroke Neg Hx   . Hearing loss Neg Hx     Social History   Tobacco Use  . Smoking status: Never Smoker  . Smokeless tobacco: Never Used  Vaping Use  . Vaping Use: Never used  Substance Use Topics  . Alcohol use: No  .  Drug use: No    Home Medications Prior to Admission medications   Medication Sig Start Date End Date Taking? Authorizing Provider  divalproex (DEPAKOTE ER) 500 MG 24 hr tablet Take 500 mg by mouth at bedtime. 03/28/19  Yes [provider]  mirtazapine (REMERON) 15 MG tablet Take 15 mg by mouth at bedtime. 03/27/19  Yes [provider]  sertraline (ZOLOFT) 100 MG tablet Take 100 mg by mouth daily after breakfast. 03/28/19  Yes [provider]    Allergies    Nubain [nalbuphine hcl]  Review of Systems   Review of Systems  Musculoskeletal: Positive for arthralgias, joint swelling and myalgias.  Skin: Negative for color change and wound.  Neurological: Negative for weakness and numbness.    Physical Exam Updated Vital Signs BP (!) 146/100 (BP Location: Right Arm)   Pulse 94   Temp 98.1 F (36.7 C) (Oral)   Resp 14   Ht 5' (1.524 m)   Wt  117.9 kg   SpO2 98%   BMI 50.78 kg/m   Physical Exam Vitals and nursing note reviewed.  Constitutional:      General: She is not in acute distress.    Appearance: Normal appearance. She is obese. She is not ill-appearing, toxic-appearing or diaphoretic.  HENT:     Head: Normocephalic and atraumatic.     Nose: Nose normal.     Mouth/Throat:     Pharynx: Oropharynx is clear.  Eyes:     Extraocular Movements: Extraocular movements intact.  Cardiovascular:     Rate and Rhythm: Normal rate.     Pulses: Normal pulses.  Pulmonary:     Effort: Pulmonary effort is normal.  Abdominal:     Palpations: Abdomen is soft.     Tenderness: There is no abdominal tenderness.  Musculoskeletal:        General: Swelling and tenderness present. No deformity or signs of injury. Normal range of motion.     Cervical back: Normal range of motion.     Right lower leg: No edema.     Left lower leg: No edema.     Comments: Mild TTP noted overlying the left anterior knee.  Unable to perform anterior posterior drawer secondary to pain.  Unable to assess range of motion secondary to pain.  Unable to assess gait secondary to pain.  Mild TTP noted circumferentially around the left ankle.  Trace edema noted along the left ankle.  Palpable pedal pulses noted bilaterally.  Unable to assess strength or range of motion secondary to pain.  Skin:    General: Skin is warm and dry.  Neurological:     General: No focal deficit present.     Mental Status: She is alert and oriented to person, place, and time.     Comments: Distal sensation intact in the bilateral lower extremities.  Patient able to move all toes spontaneously without difficulty.  Psychiatric:        Mood and Affect: Mood normal.        Behavior: Behavior normal.    ED Results / Procedures / Treatments   Labs (all labs ordered are listed, but only abnormal results are displayed) Labs Reviewed - No data to display  EKG None  Radiology DG Ankle  Complete Left  Result Date: 05/08/2020 CLINICAL DATA:  Fall 3 days ago with persistent ankle pain, initial encounter EXAM: LEFT ANKLE COMPLETE - 3+ VIEW COMPARISON:  None. FINDINGS: Degenerative changes of the tibiotalar joint are noted. No acute fracture  or dislocation is seen. No significant soft tissue swelling is noted. IMPRESSION: No acute abnormality noted. Electronically Signed   By: Alcide Clever M.D.   On: 05/08/2020 15:57   DG Knee Complete 4 Views Left  Result Date: 05/08/2020 CLINICAL DATA:  Left knee pain after injury.  Fall 3 days ago. EXAM: LEFT KNEE - COMPLETE 4+ VIEW COMPARISON:  None. FINDINGS: No evidence of fracture, dislocation, or joint effusion. Early degenerative spurring of the medial tibiofemoral compartment in the tibial spines. Well corticated calcification adjacent to the lateral tibial spine is chronic. Soft tissues are unremarkable. IMPRESSION: 1. No acute fracture or subluxation of the left knee. 2. Early degenerative spurring of the medial tibiofemoral compartment. Electronically Signed   By: Narda Rutherford M.D.   On: 05/08/2020 15:07    Procedures Procedures (including critical care time)  Medications Ordered in ED Medications  acetaminophen (TYLENOL) tablet 500 mg (500 mg Oral Given 05/08/20 1543)  naproxen (NAPROSYN) tablet 500 mg (500 mg Oral Given 05/08/20 1543)    ED Course  I have reviewed the triage vital signs and the nursing notes.  Pertinent labs & imaging results that were available during my care of the patient were reviewed by me and considered in my medical decision making (see chart for details).    MDM Rules/Calculators/A&P                          Patient is a 28 year old female that presents with history and physical exam as noted above.  X-rays were obtained of the left knee and ankle.  No acute fracture or subluxation of the knee.  Early degenerative spurring of the medial tibiofemoral compartment.  X-rays of the left ankle were  negative.  I discussed these findings with the patient.  She has been evaluated by Dr. Farris Has in the past.  She is planning on following up with him in the next 1 to 2 weeks if her symptoms do not improve.  We will give patient a ankle brace.  Continued use of Tylenol as needed for pain management.  We discussed safety and dosing regarding this medication.  Her questions were answered and she was amicable at the time of discharge.  She was given strict return precautions.  Her vital signs are stable.  Patient discharged to home/self care.  Condition at discharge: Stable  Note: Portions of this report may have been transcribed using voice recognition software. Every effort was made to ensure accuracy; however, inadvertent computerized transcription errors may be present.    Final Clinical Impression(s) / ED Diagnoses Final diagnoses:  Acute pain of left knee  Acute left ankle pain   Rx / DC Orders ED Discharge Orders    None       Placido Sou, PA-C 05/08/20 1613    Mancel Bale, MD 05/08/20 1849

## 2020-05-08 NOTE — ED Triage Notes (Signed)
Pt arrives to ED w/ c/o L knee pain secondary to a fall that happened 3 days ago. Pt states she "twisted" her knee, 10/10 pain.

## 2020-06-09 ENCOUNTER — Inpatient Hospital Stay (HOSPITAL_COMMUNITY)
Admission: EM | Admit: 2020-06-09 | Discharge: 2020-06-09 | Disposition: A | Discharge status: Home / Self Care | Payer: Medicaid Other | Attending: Family Medicine | Admitting: Family Medicine

## 2020-06-09 ENCOUNTER — Other Ambulatory Visit: Payer: Self-pay

## 2020-06-09 ENCOUNTER — Encounter (HOSPITAL_COMMUNITY): Payer: Self-pay | Admitting: Emergency Medicine

## 2020-06-09 DIAGNOSIS — Z79899 Other long term (current) drug therapy: Secondary | ICD-10-CM | POA: Insufficient documentation

## 2020-06-09 DIAGNOSIS — T50905A Adverse effect of unspecified drugs, medicaments and biological substances, initial encounter: Secondary | ICD-10-CM

## 2020-06-09 DIAGNOSIS — R519 Headache, unspecified: Secondary | ICD-10-CM | POA: Diagnosis not present

## 2020-06-09 DIAGNOSIS — F4310 Post-traumatic stress disorder, unspecified: Secondary | ICD-10-CM | POA: Insufficient documentation

## 2020-06-09 DIAGNOSIS — Z8759 Personal history of other complications of pregnancy, childbirth and the puerperium: Secondary | ICD-10-CM | POA: Insufficient documentation

## 2020-06-09 DIAGNOSIS — O99351 Diseases of the nervous system complicating pregnancy, first trimester: Secondary | ICD-10-CM | POA: Insufficient documentation

## 2020-06-09 DIAGNOSIS — Z3A01 Less than 8 weeks gestation of pregnancy: Secondary | ICD-10-CM | POA: Insufficient documentation

## 2020-06-09 DIAGNOSIS — G44209 Tension-type headache, unspecified, not intractable: Secondary | ICD-10-CM | POA: Diagnosis not present

## 2020-06-09 DIAGNOSIS — O161 Unspecified maternal hypertension, first trimester: Secondary | ICD-10-CM | POA: Insufficient documentation

## 2020-06-09 DIAGNOSIS — O99211 Obesity complicating pregnancy, first trimester: Secondary | ICD-10-CM | POA: Insufficient documentation

## 2020-06-09 DIAGNOSIS — O210 Mild hyperemesis gravidarum: Secondary | ICD-10-CM

## 2020-06-09 DIAGNOSIS — F332 Major depressive disorder, recurrent severe without psychotic features: Secondary | ICD-10-CM | POA: Insufficient documentation

## 2020-06-09 DIAGNOSIS — O99341 Other mental disorders complicating pregnancy, first trimester: Secondary | ICD-10-CM | POA: Diagnosis not present

## 2020-06-09 DIAGNOSIS — O34219 Maternal care for unspecified type scar from previous cesarean delivery: Secondary | ICD-10-CM | POA: Diagnosis not present

## 2020-06-09 DIAGNOSIS — O26891 Other specified pregnancy related conditions, first trimester: Secondary | ICD-10-CM

## 2020-06-09 DIAGNOSIS — O219 Vomiting of pregnancy, unspecified: Secondary | ICD-10-CM | POA: Diagnosis not present

## 2020-06-09 LAB — CBC
HCT: 39.9 % (ref 36.0–46.0)
Hemoglobin: 12.6 g/dL (ref 12.0–15.0)
MCH: 26.5 pg (ref 26.0–34.0)
MCHC: 31.6 g/dL (ref 30.0–36.0)
MCV: 84 fL (ref 80.0–100.0)
Platelets: 305 10*3/uL (ref 150–400)
RBC: 4.75 MIL/uL (ref 3.87–5.11)
RDW: 14.2 % (ref 11.5–15.5)
WBC: 10.9 10*3/uL — ABNORMAL HIGH (ref 4.0–10.5)
nRBC: 0 % (ref 0.0–0.2)

## 2020-06-09 LAB — URINALYSIS, ROUTINE W REFLEX MICROSCOPIC
Bilirubin Urine: NEGATIVE
Glucose, UA: NEGATIVE mg/dL
Hgb urine dipstick: NEGATIVE
Ketones, ur: NEGATIVE mg/dL
Nitrite: NEGATIVE
Protein, ur: NEGATIVE mg/dL
Specific Gravity, Urine: 1.025 (ref 1.005–1.030)
Squamous Epithelial / HPF: >50 — ABNORMAL HIGH (ref 0–5)
pH: 5 (ref 5.0–8.0)

## 2020-06-09 LAB — COMPREHENSIVE METABOLIC PANEL
ALT: 15 U/L (ref 0–44)
AST: 14 U/L — ABNORMAL LOW (ref 15–41)
Albumin: 3.5 g/dL (ref 3.5–5.0)
Alkaline Phosphatase: 67 U/L (ref 38–126)
Anion gap: 12 (ref 5–15)
BUN: <5 mg/dL — ABNORMAL LOW (ref 6–20)
CO2: 22 mmol/L (ref 22–32)
Calcium: 8.7 mg/dL — ABNORMAL LOW (ref 8.9–10.3)
Chloride: 102 mmol/L (ref 98–111)
Creatinine, Ser: 0.72 mg/dL (ref 0.44–1.00)
GFR calc Af Amer: >60 mL/min (ref >60)
GFR calc non Af Amer: >60 mL/min (ref >60)
Glucose, Bld: 105 mg/dL — ABNORMAL HIGH (ref 70–99)
Potassium: 3.4 mmol/L — ABNORMAL LOW (ref 3.5–5.1)
Sodium: 136 mmol/L (ref 135–145)
Total Bilirubin: 0.6 mg/dL (ref 0.3–1.2)
Total Protein: 7.1 g/dL (ref 6.5–8.1)

## 2020-06-09 LAB — LIPASE, BLOOD: Lipase: 20 U/L (ref 11–51)

## 2020-06-09 LAB — I-STAT BETA HCG BLOOD, ED (MC, WL, AP ONLY): I-stat hCG, quantitative: 419.3 m[IU]/mL — ABNORMAL HIGH (ref <5)

## 2020-06-09 MED ORDER — DEXAMETHASONE SODIUM PHOSPHATE 10 MG/ML IJ SOLN
10.0000 mg | Freq: Once | INTRAMUSCULAR | Status: AC
  Administered 2020-06-09: 10 mg via INTRAVENOUS
  Filled 2020-06-09: qty 1

## 2020-06-09 MED ORDER — SODIUM CHLORIDE 0.9% FLUSH: 3.0000 mL | Freq: Once | INTRAVENOUS | Status: DC

## 2020-06-09 MED ORDER — DIPHENHYDRAMINE HCL 50 MG/ML IJ SOLN
12.5000 mg | Freq: Once | INTRAMUSCULAR | Status: DC
  Filled 2020-06-09: qty 1

## 2020-06-09 MED ORDER — M.V.I. ADULT IV INJ
Freq: Once | INTRAVENOUS | Status: AC
  Filled 2020-06-09: qty 10

## 2020-06-09 MED ORDER — METOCLOPRAMIDE HCL 5 MG/ML IJ SOLN
10.0000 mg | Freq: Once | INTRAMUSCULAR | Status: AC
  Administered 2020-06-09: 10 mg via INTRAVENOUS
  Filled 2020-06-09: qty 2

## 2020-06-09 MED ORDER — LACTATED RINGERS IV BOLUS
1000.0000 mL | Freq: Once | INTRAVENOUS | Status: AC
  Administered 2020-06-09: 1000 mL via INTRAVENOUS

## 2020-06-09 MED ORDER — METOCLOPRAMIDE HCL 10 MG PO TABS
10.0000 mg | ORAL_TABLET | Freq: Three times a day (TID) | ORAL | 0 refills | Status: DC | PRN
Start: 2020-06-09 — End: 2020-11-03

## 2020-06-09 MED ORDER — DIPHENHYDRAMINE HCL 50 MG/ML IJ SOLN
25.0000 mg | Freq: Once | INTRAMUSCULAR | Status: AC
  Administered 2020-06-09: 25 mg via INTRAVENOUS

## 2020-06-09 NOTE — Discharge Instructions (Signed)
Tension Headache, Adult A tension headache is pain, pressure, or aching in your head. Tension headaches can last from 30 minutes to several days. Follow these instructions at home: Managing pain  Take over-the-counter and prescription medicines only as told by your doctor.  When you have a headache, lie down in a dark, quiet room.  If told, put ice on your head and neck: ? Put ice in a plastic bag. ? Place a towel between your skin and the bag. ? Leave the ice on for 20 minutes, 2-3 times a day.  If told, put heat on the back of your neck. Do this as often as your doctor tells you to. Use the kind of heat that your doctor recommends, such as a moist heat pack or a heating pad. ? Place a towel between your skin and the heat. ? Leave the heat on for 20-30 minutes. ? Remove the heat if your skin turns bright red. Eating and drinking  Eat meals on a regular schedule.  Watch how much alcohol you drink: ? If you are a woman and are not pregnant, do not drink more than 1 drink a day. ? If you are a man, do not drink more than 2 drinks a day.  Drink enough fluid to keep your pee (urine) pale yellow.  Do not use a lot of caffeine, or stop using caffeine. Lifestyle  Get enough sleep. Get 7-9 hours of sleep each night. Or get the amount of sleep that your doctor tells you to.  At bedtime, remove all electronic devices from your room. Examples of electronic devices are computers, phones, and tablets.  Find ways to lessen your stress. Some things that can lessen stress are: ? Exercise. ? Deep breathing. ? Yoga. ? Music. ? Positive thoughts.  Sit up straight. Do not tighten (tense) your muscles.  Do not use any products that have nicotine or tobacco in them, such as cigarettes and e-cigarettes. If you need help quitting, ask your doctor. General instructions   Keep all follow-up visits as told by your doctor. This is important.  Avoid things that can bring on headaches. Keep a  journal to find out if certain things bring on headaches. For example, write down: ? What you eat and drink. ? How much sleep you get. ? Any change to your diet or medicines. Contact a doctor if:  Your headache does not get better.  Your headache comes back.  You have a headache and sounds, light, or smells bother you.  You feel sick to your stomach (nauseous) or you throw up (vomit).  Your stomach hurts. Get help right away if:  You suddenly get a very bad headache along with any of these: ? A stiff neck. ? Feeling sick to your stomach. ? Throwing up. ? Feeling weak. ? Trouble seeing. ? Feeling short of breath. ? A rash. ? Feeling unusually sleepy. ? Trouble speaking. ? Pain in your eye or ear. ? Trouble walking or balancing. ? Feeling like you will pass out (faint). ? Passing out. Summary  A tension headache is pain, pressure, or aching in your head.  Tension headaches can last from 30 minutes to several days.  Lifestyle changes and medicines may help relieve pain. This information is not intended to replace advice given to you by your health care provider. Make sure you discuss any questions you have with your health care provider. Document Revised: 09/10/2019 Document Reviewed: 02/23/2017 Elsevier Patient Education  2020 [employer].  Morning Sickness  Morning sickness is when you feel sick to your stomach (nauseous) during pregnancy. You may feel sick to your stomach and throw up (vomit). You may feel sick in the morning, but you can feel this way at any time of day. Some women feel very sick to their stomach and cannot stop throwing up (hyperemesis gravidarum). Follow these instructions at home: Medicines  Take over-the-counter and prescription medicines only as told by your doctor. Do not take any medicines until you talk with your doctor about them first.  Taking multivitamins before getting pregnant can stop or lessen the harshness of morning  sickness. Eating and drinking  Eat dry toast or crackers before getting out of bed.  Eat 5 or 6 small meals a day.  Eat dry and bland foods like rice and baked potatoes.  Do not eat greasy, fatty, or spicy foods.  Have someone cook for you if the smell of food causes you to feel sick or throw up.  If you feel sick to your stomach after taking prenatal vitamins, take them at night or with a snack.  Eat protein when you need a snack. Nuts, yogurt, and cheese are good choices.  Drink fluids throughout the day.  Try ginger ale made with real ginger, ginger tea made from fresh grated ginger, or ginger candies. General instructions  Do not use any products that have nicotine or tobacco in them, such as cigarettes and e-cigarettes. If you need help quitting, ask your doctor.  Use an air purifier to keep the air in your house free of smells.  Get lots of fresh air.  Try to avoid smells that make you feel sick.  Try: ? Wearing a bracelet that is used for seasickness (acupressure wristband). ? Going to a doctor who puts thin needles into certain body points (acupuncture) to improve how you feel. Contact a doctor if:  You need medicine to feel better.  You feel dizzy or light-headed.  You are losing weight. Get help right away if:  You feel very sick to your stomach and cannot stop throwing up.  You pass out (faint).  You have very bad pain in your belly. Summary  Morning sickness is when you feel sick to your stomach (nauseous) during pregnancy.  You may feel sick in the morning, but you can feel this way at any time of day.  Making some changes to what you eat may help your symptoms go away. This information is not intended to replace advice given to you by your health care provider. Make sure you discuss any questions you have with your health care provider. Document Revised: 10/26/2017 Document Reviewed: 12/14/2016 Elsevier Patient Education  2020 [employer].

## 2020-06-09 NOTE — MAU Note (Signed)
O2 taken off pt, O2 sat remains at 100%

## 2020-06-09 NOTE — MAU Note (Signed)
PT called out stating she could not breathe. Found pt laying over bed. O2 Sat 64%. Assisted pt to bed with other staff assistance. O2 applied at 10lpm non rebreather face mask. O2 up to 100%. MVI bag just started at 0808, discontinued. 1000 of NS started .

## 2020-06-09 NOTE — ED Triage Notes (Signed)
Pt reports emesis for the past two weeks w/ a headache starting yesterday.  Pt did a home pregnancy test and it was positive.  No other symptoms at this time.Marland Kitchen

## 2020-06-09 NOTE — MAU Provider Note (Addendum)
History     CSN: [id number]  Arrival date and time: 06/09/20 4098   First Provider Initiated Contact with Patient 06/09/20 [phone]      Chief Complaint  Patient presents with  . Emesis  . Headache   Alexandra Gray is a 29 y.o. G3P2 at [redacted]w[redacted]d by LMP who presents to MAU as transfer from [facility] with complaints of emesis and headache. Patient reports that she has been having nausea/emesis over the past 2 weeks. Patient reports being unable to keep liquid or food down over the past 24 hours, reports 3-4 times of emesis over the past 24 hours. She reports a headache that started occurring yesterday, rates frontal HA 6/10- pt has a hx of migraines, usually takes Tylenol for migraines. Patient reports that she did not take any medication for HA d/t emesis and feeling like she would not be able to keep medication down. She denies any other complaints. Denies abdominal pain, vaginal bleeding, or discharge.   OB History    Gravida  3   Para  2   Term  2   Preterm      AB      Living  2     SAB      TAB      Ectopic      Multiple  0   Live Births  2           Past Medical History:  Diagnosis Date  . Chlamydia   . Depression    doing ok now  . Gestational diabetes   . Gonorrhea   . History of pregnancy induced hypertension 12/06/2016  . Hx of trichomoniasis   . Hypertension    sometimes is up, never on meds  . Mental disorder   . Ovarian cyst   . PID (pelvic inflammatory disease)   . Sleep apnea     Past Surgical History:  Procedure Laterality Date  . CESAREAN SECTION N/A 10/03/2015   Procedure: CESAREAN SECTION;  Surgeon: Tilda Burrow, MD;  Location: [facility];  Service: Obstetrics;  Laterality: N/A;  . CESAREAN SECTION N/A 01/04/2019   Procedure: CESAREAN SECTION;  Surgeon: Hermina Staggers, MD;  Location: [facility] BIRTHING SUITES;  Service: Obstetrics;  Laterality: N/A;  . KNEE SURGERY      Family History  Problem Relation Age of Onset  . Healthy Mother   . Healthy  Father   . Healthy Brother   . Cancer Neg Hx   . Diabetes Neg Hx   . Heart disease Neg Hx   . Hypertension Neg Hx   . Stroke Neg Hx   . Hearing loss Neg Hx     Social History   Tobacco Use  . Smoking status: Never Smoker  . Smokeless tobacco: Never Used  Vaping Use  . Vaping Use: Never used  Substance Use Topics  . Alcohol use: No  . Drug use: No    Allergies:  Allergies  Allergen Reactions  . Infuvite Adult [Multiple Vitamin] Shortness Of Breath  . Nubain [Nalbuphine Hcl] Other (See Comments)    Makes patient hot    No medications prior to admission.    Review of Systems  Constitutional: Negative.   Respiratory: Negative.   Cardiovascular: Negative.   Gastrointestinal: Positive for nausea and vomiting. Negative for abdominal pain, constipation and diarrhea.  Genitourinary: Negative.   Musculoskeletal: Negative.   Neurological: Positive for headaches.  Psychiatric/Behavioral: Negative.    Physical Exam   Blood pressure 111/74, pulse  91, temperature 98.9 F (37.2 C), temperature source Oral, resp. rate 16, height 5' (1.524 m), weight (!) 144.7 kg, last menstrual period 04/23/2020, SpO2 100 %, not currently breastfeeding.  Physical Exam Vitals and nursing note reviewed.  Constitutional:      Appearance: She is obese.  HENT:     Head: Normocephalic.  Cardiovascular:     Rate and Rhythm: Normal rate and regular rhythm.  Pulmonary:     Effort: Pulmonary effort is normal. No respiratory distress.     Breath sounds: Normal breath sounds. No wheezing.  Abdominal:     General: There is no distension.     Palpations: Abdomen is soft. There is no mass.     Tenderness: There is no abdominal tenderness.  Skin:    General: Skin is warm and dry.  Neurological:     Mental Status: She is alert and oriented to person, place, and time.  Psychiatric:        Mood and Affect: Mood normal.        Behavior: Behavior normal.    MAU Course  Procedures  MDM Treatments  in MAU including HA cocktail including benadryl, reglan and decadron IV  Meds ordered this encounter  Medications  . DISCONTD: sodium chloride flush (NS) 0.9 % injection 3 mL  . lactated ringers bolus 1,000 mL  . DISCONTD: diphenhydrAMINE (BENADRYL) injection 12.5 mg  . metoCLOPramide (REGLAN) injection 10 mg  . dexamethasone (DECADRON) injection 10 mg  . diphenhydrAMINE (BENADRYL) injection 25 mg  . multivitamins adult (INFUVITE ADULT) 10 mL in lactated ringers 1,000 mL infusion  . metoCLOPramide (REGLAN) 10 MG tablet    Sig: Take 1 tablet (10 mg total) by mouth every 8 (eight) hours as needed for nausea or vomiting (or headache).    Dispense:  30 tablet    Refill:  0    Order Specific Question:   Supervising Provider    Answer:   Hermina StaggersRVIN, MICHAEL L [[id number]]   Medication given @ [phone], plan to reassess in 30 minutes  [facility]:  Results for orders placed or performed during the hospital encounter of 06/09/20 (from the past 24 hour(s))  Urinalysis, Routine w reflex microscopic     Status: Abnormal   Collection Time: 06/09/20  2:34 AM  Result Value Ref Range   Color, Urine YELLOW YELLOW   APPearance CLOUDY (A) CLEAR   Specific Gravity, Urine 1.025 1.005 - 1.030   pH 5.0 5.0 - 8.0   Glucose, UA NEGATIVE NEGATIVE mg/dL   Hgb urine dipstick NEGATIVE NEGATIVE   Bilirubin Urine NEGATIVE NEGATIVE   Ketones, ur NEGATIVE NEGATIVE mg/dL   Protein, ur NEGATIVE NEGATIVE mg/dL   Nitrite NEGATIVE NEGATIVE   Leukocytes,Ua TRACE (A) NEGATIVE   RBC / HPF 0-5 0 - 5 RBC/hpf   WBC, UA 0-5 0 - 5 WBC/hpf   Bacteria, UA RARE (A) NONE SEEN   Squamous Epithelial / LPF >50 (H) 0 - 5   Mucus PRESENT    Hyaline Casts, UA PRESENT   Lipase, blood     Status: None   Collection Time: 06/09/20  2:41 AM  Result Value Ref Range   Lipase 20 11 - 51 U/L  Comprehensive metabolic panel     Status: Abnormal   Collection Time: 06/09/20  2:41 AM  Result Value Ref Range   Sodium 136 135 - 145  mmol/L   Potassium 3.4 (L) 3.5 - 5.1 mmol/L   Chloride 102 98 - 111 mmol/L  CO2 22 22 - 32 mmol/L   Glucose, Bld 105 (H) 70 - 99 mg/dL   BUN <5 (L) 6 - 20 mg/dL   Creatinine, Ser 3.01 0.44 - 1.00 mg/dL   Calcium 8.7 (L) 8.9 - 10.3 mg/dL   Total Protein 7.1 6.5 - 8.1 g/dL   Albumin 3.5 3.5 - 5.0 g/dL   AST 14 (L) 15 - 41 U/L   ALT 15 0 - 44 U/L   Alkaline Phosphatase 67 38 - 126 U/L   Total Bilirubin 0.6 0.3 - 1.2 mg/dL   GFR calc non Af Amer >60 >60 mL/min   GFR calc Af Amer >60 >60 mL/min   Anion gap 12 5 - 15  CBC     Status: Abnormal   Collection Time: 06/09/20  2:41 AM  Result Value Ref Range   WBC 10.9 (H) 4.0 - 10.5 K/uL   RBC 4.75 3.87 - 5.11 MIL/uL   Hemoglobin 12.6 12.0 - 15.0 g/dL   HCT 60.1 36 - 46 %   MCV 84.0 80.0 - 100.0 fL   MCH 26.5 26.0 - 34.0 pg   MCHC 31.6 30.0 - 36.0 g/dL   RDW 09.3 23.5 - 57.3 %   Platelets 305 150 - 400 K/uL   nRBC 0.0 0.0 - 0.2 %  I-Stat beta hCG blood, ED     Status: Abnormal   Collection Time: 06/09/20  2:48 AM  Result Value Ref Range   I-stat hCG, quantitative 419.3 (H) <5 mIU/mL   Comment 3           Reassessment @ [phone] - patient reports HA is still present but starting to come down, rates HA 4/10.  UA noted no ketones or signs of dehydration but will order MVI d/t continued HA and plan to reassess after second bag completed.   Care taken over by Donette Larry CNM @ [phone]. Donette Larry, CNM 06/09/20, 10:31 AM 0809: Called to bedside for pt SOB, IV MTV infusion just started but immediately d/c'd. Pt leaning against foot of bed, unable to get herself into bed after verbal request, myself and 4 staff members lifted pt into bed. Pt diaphoretic with slight increased WOB and o2 sat 64% with probe on patients acrylic nails, probe moved to patients toe and reading 72%, o2 @10  L by NRB applied and sats quickly improved to 99-100%. BP stable. Pt taken off o2 after 10 min and maintaining sats on room air, normal WOB, and feels better, HA  resolved. Medication updated on allergy list. Pt observed longer and found to be stable for discharge home.   Assessment and Plan   1. Nausea and vomiting in pregnancy   2. [redacted] weeks gestation of pregnancy   3. Acute non intractable tension-type headache   4. Morning sickness   5. Adverse effect of drug, initial encounter    Discharge home Follow up at [facility] to start care Rx Reglan Benadryl and Tylenol prn HA Return precautions  Allergies as of 06/09/2020      Reactions   Infuvite Adult [multiple Vitamin] Shortness Of Breath   Nubain [nalbuphine Hcl] Other (See Comments)   Makes patient hot      Medication List    STOP taking these medications   divalproex 500 MG 24 hr tablet Commonly known as: DEPAKOTE ER   mirtazapine 15 MG tablet Commonly known as: REMERON     TAKE these medications   metoCLOPramide 10 MG tablet Commonly known as: Reglan Take 1 tablet (10  mg total) by mouth every 8 (eight) hours as needed for nausea or vomiting (or headache).   sertraline 100 MG tablet Commonly known as: ZOLOFT Take 100 mg by mouth daily after breakfast.       Donette Larry, CNM  06/09/2020 10:31 AM

## 2020-06-09 NOTE — ED Provider Notes (Signed)
[facility] EMERGENCY DEPARTMENT Provider Note   CSN: [id number] Arrival date & time: 06/09/20  0215     History Chief Complaint  Patient presents with  . Emesis  . Headache    Alexandra Gray is a 29 y.o. female.  The history is provided by the patient and medical records.  Emesis Headache Associated symptoms: vomiting     29 y.o. G3P2 with LMP 05/24/20, presenting to the ED with nausea/vomiting x 2 weeks.  Reports positive UPT at home today.  States she has had ongoing vomiting, able to tolerate small amounts of liquid but no solid food.  She is not had any fever chills, or diarrhea.  No urinary symptoms.  No irregular vaginal bleeding or discharge.  States she had a lot of vomiting with her prior 2 pregnancies, told she had hyperemesis at one point.  Past Medical History:  Diagnosis Date  . Chlamydia   . Depression    doing ok now  . Gestational diabetes   . Gonorrhea   . History of pregnancy induced hypertension 12/06/2016  . Hx of trichomoniasis   . Hypertension    sometimes is up, never on meds  . Mental disorder   . Ovarian cyst   . PID (pelvic inflammatory disease)   . Sleep apnea     Patient Active Problem List   Diagnosis Date Noted  . H/O: C-section 01/04/2019  . Cesarean delivery delivered 01/04/2019  . GBS (group B Streptococcus carrier), +RV culture, currently pregnant 12/23/2018  . GDM, class A2 08/12/2018  . Chronic hypertension during pregnancy, antepartum 07/30/2018  . Obesity in pregnancy 06/27/2018  . BMI 50.0-59.9, adult (HCC) 05/28/2018  . History of cesarean delivery affecting pregnancy 10/03/2015  . Supervision of high risk pregnancy, antepartum 08/23/2015  . Severe major depression without psychotic features (HCC) 02/18/2014  . 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  . PTSD (post-traumatic stress disorder) 02/16/2014  . Sleep apnea 02/16/2014  . MDD (major depressive disorder), recurrent severe, without  psychosis (HCC) 08/04/2013    Past Surgical History:  Procedure Laterality Date  . CESAREAN SECTION N/A 10/03/2015   Procedure: CESAREAN SECTION;  Surgeon: Tilda Burrow, MD;  Location: [facility] OR[facility];  Service: Obstetrics;  Laterality: N/A;  . CESAREAN SECTION N/A 01/04/2019   Procedure: CESAREAN SECTION;  Surgeon: Hermina Staggers, MD;  Location: [facility] BIRTHING SUITES;  Service: Obstetrics;  Laterality: N/A;  . KNEE SURGERY       OB History    Gravida  2   Para  2   Term  2   Preterm      AB      Living  2     SAB      TAB      Ectopic      Multiple  0   Live Births  2           Family History  Problem Relation Age of Onset  . Healthy Mother   . Healthy Father   . Healthy Brother   . Cancer Neg Hx   . Diabetes Neg Hx   . Heart disease Neg Hx   . Hypertension Neg Hx   . Stroke Neg Hx   . Hearing loss Neg Hx     Social History   Tobacco Use  . Smoking status: Never Smoker  . Smokeless tobacco: Never Used  Vaping Use  . Vaping Use: Never used  Substance Use Topics  . Alcohol use: No  .  Drug use: No    Home Medications Prior to Admission medications   Medication Sig Start Date End Date Taking? Authorizing Provider  divalproex (DEPAKOTE ER) 500 MG 24 hr tablet Take 500 mg by mouth at bedtime. 03/28/19   [provider]  mirtazapine (REMERON) 15 MG tablet Take 15 mg by mouth at bedtime. 03/27/19   [provider]  sertraline (ZOLOFT) 100 MG tablet Take 100 mg by mouth daily after breakfast. 03/28/19   [provider]    Allergies    Nubain [nalbuphine hcl]  Review of Systems   Review of Systems  Gastrointestinal: Positive for vomiting.  All other systems reviewed and are negative.   Physical Exam Updated Vital Signs BP (!) 147/89 (BP Location: Left Arm)   Pulse (!) 101   Temp 98.9 F (37.2 C) (Oral)   Resp 16   SpO2 99%   Physical Exam Vitals and nursing note reviewed.  Constitutional:      Appearance: She is  well-developed.     Comments: Obese  HENT:     Head: Normocephalic and atraumatic.  Eyes:     Conjunctiva/sclera: Conjunctivae normal.     Pupils: Pupils are equal, round, and reactive to light.  Cardiovascular:     Rate and Rhythm: Normal rate and regular rhythm.     Heart sounds: Normal heart sounds.  Pulmonary:     Effort: Pulmonary effort is normal.     Breath sounds: Normal breath sounds.  Abdominal:     General: Bowel sounds are normal.     Palpations: Abdomen is soft. There is no mass.     Tenderness: There is no abdominal tenderness.  Musculoskeletal:        General: Normal range of motion.     Cervical back: Normal range of motion.  Skin:    General: Skin is warm and dry.  Neurological:     Mental Status: She is alert and oriented to person, place, and time.     ED Results / Procedures / Treatments   Labs (all labs ordered are listed, but only abnormal results are displayed) Labs Reviewed  COMPREHENSIVE METABOLIC PANEL - Abnormal; Notable for the following components:      Result Value   Potassium 3.4 (*)    Glucose, Bld 105 (*)    BUN <5 (*)    Calcium 8.7 (*)    AST 14 (*)    All other components within normal limits  CBC - Abnormal; Notable for the following components:   WBC 10.9 (*)    All other components within normal limits  URINALYSIS, ROUTINE W REFLEX MICROSCOPIC - Abnormal; Notable for the following components:   APPearance CLOUDY (*)    Leukocytes,Ua TRACE (*)    Bacteria, UA RARE (*)    Squamous Epithelial / LPF >50 (*)    All other components within normal limits  I-STAT BETA HCG BLOOD, ED ([facility], [facility], [facility] ONLY) - Abnormal; Notable for the following components:   I-stat hCG, quantitative 419.3 (*)    All other components within normal limits  LIPASE, BLOOD    EKG None  Radiology No results found.  Procedures Procedures (including critical care time)  Medications Ordered in ED Medications  sodium chloride flush (NS) 0.9 % injection 3  mL (has no administration in time range)    ED Course  I have reviewed the triage vital signs and the nursing notes.  Pertinent labs & imaging results that were available during my care of  the patient were reviewed by me and considered in my medical decision making (see chart for details).    MDM Rules/Calculators/A&P  29 year old G3P2 with LMP 05/24/20 here with 2 weeks of nausea/vomiting.  Has tolerated small sips of fluids but no solid foods.  She is afebrile, non-toxic, hemodynamically stable.  hcg + here.  Labs grossly reassuring.  Does report history of hyperemesis in the past.    Discussed with on call OB-GYN, Dr. Adrian Blackwater-- will transfer to MAU for ongoing care.  Final Clinical Impression(s) / ED Diagnoses Final diagnoses:  Nausea and vomiting in pregnancy    Rx / DC Orders ED Discharge Orders    None       Garlon Hatchet, PA-C 06/09/20 0504    Ward, Layla Maw, DO 06/09/20 [phone]

## 2020-06-09 NOTE — ED Notes (Signed)
PA Sanders in triage to assess for appropriateness of transfer to MAU.

## 2020-06-09 NOTE — ED Notes (Signed)
Patient cleared for MAU by PA Allyne Gee, report called to MAU RN.

## 2020-06-10 LAB — CULTURE, OB URINE: Culture: >=100000 — AB

## 2020-06-13 ENCOUNTER — Encounter (HOSPITAL_COMMUNITY): Payer: Self-pay | Admitting: Obstetrics & Gynecology

## 2020-06-13 ENCOUNTER — Inpatient Hospital Stay (HOSPITAL_COMMUNITY)
Admission: AD | Admit: 2020-06-13 | Discharge: 2020-06-13 | Disposition: A | Discharge status: Home / Self Care | Payer: Medicaid Other | Attending: Obstetrics & Gynecology | Admitting: Obstetrics & Gynecology

## 2020-06-13 ENCOUNTER — Other Ambulatory Visit: Payer: Self-pay

## 2020-06-13 ENCOUNTER — Inpatient Hospital Stay (HOSPITAL_COMMUNITY): Payer: Medicaid Other

## 2020-06-13 DIAGNOSIS — Z3A01 Less than 8 weeks gestation of pregnancy: Secondary | ICD-10-CM | POA: Diagnosis not present

## 2020-06-13 DIAGNOSIS — O9A211 Injury, poisoning and certain other consequences of external causes complicating pregnancy, first trimester: Secondary | ICD-10-CM | POA: Diagnosis not present

## 2020-06-13 DIAGNOSIS — Z8619 Personal history of other infectious and parasitic diseases: Secondary | ICD-10-CM | POA: Diagnosis not present

## 2020-06-13 DIAGNOSIS — R109 Unspecified abdominal pain: Secondary | ICD-10-CM | POA: Insufficient documentation

## 2020-06-13 DIAGNOSIS — W19XXXA Unspecified fall, initial encounter: Secondary | ICD-10-CM | POA: Insufficient documentation

## 2020-06-13 DIAGNOSIS — O26891 Other specified pregnancy related conditions, first trimester: Secondary | ICD-10-CM

## 2020-06-13 DIAGNOSIS — O3680X0 Pregnancy with inconclusive fetal viability, not applicable or unspecified: Secondary | ICD-10-CM | POA: Diagnosis not present

## 2020-06-13 DIAGNOSIS — O4691 Antepartum hemorrhage, unspecified, first trimester: Secondary | ICD-10-CM

## 2020-06-13 DIAGNOSIS — S3981XA Other specified injuries of abdomen, initial encounter: Secondary | ICD-10-CM | POA: Diagnosis not present

## 2020-06-13 LAB — WET PREP, GENITAL
Clue Cells Wet Prep HPF POC: NONE SEEN
Sperm: NONE SEEN
Trich, Wet Prep: NONE SEEN
Yeast Wet Prep HPF POC: NONE SEEN

## 2020-06-13 LAB — URINALYSIS, ROUTINE W REFLEX MICROSCOPIC
Bilirubin Urine: NEGATIVE
Glucose, UA: NEGATIVE mg/dL
Ketones, ur: NEGATIVE mg/dL
Leukocytes,Ua: NEGATIVE
Nitrite: NEGATIVE
Protein, ur: NEGATIVE mg/dL
Specific Gravity, Urine: 1.023 (ref 1.005–1.030)
pH: 6 (ref 5.0–8.0)

## 2020-06-13 LAB — CBC
HCT: 39.7 % (ref 36.0–46.0)
Hemoglobin: 13.1 g/dL (ref 12.0–15.0)
MCH: 27.8 pg (ref 26.0–34.0)
MCHC: 33 g/dL (ref 30.0–36.0)
MCV: 84.3 fL (ref 80.0–100.0)
Platelets: 287 10*3/uL (ref 150–400)
RBC: 4.71 MIL/uL (ref 3.87–5.11)
RDW: 14 % (ref 11.5–15.5)
WBC: 12.3 10*3/uL — ABNORMAL HIGH (ref 4.0–10.5)
nRBC: 0 % (ref 0.0–0.2)

## 2020-06-13 LAB — HCG, QUANTITATIVE, PREGNANCY: hCG, Beta Chain, Quant, S: 575 m[IU]/mL — ABNORMAL HIGH (ref <5)

## 2020-06-13 IMAGING — US US OB < 14 WEEKS - US OB TV
1 series · 15 of 28 positions shown · non-contrast
Comparison: None.

CLINICAL DATA: Post fall cramping positive beta HCG

EXAM:
OBSTETRIC <14 WK US AND TRANSVAGINAL OB US
TECHNIQUE: Both transabdominal and transvaginal ultrasound examinations were
performed for complete evaluation of the gestation as well as the
maternal uterus, adnexal regions, and pelvic cul-de-sac.
Transvaginal technique was performed to assess early pregnancy.

[Series 1: us ob < 14 weeks - us ob tv · 39 acquisitions, 15 frames shown]
[im 1/39]
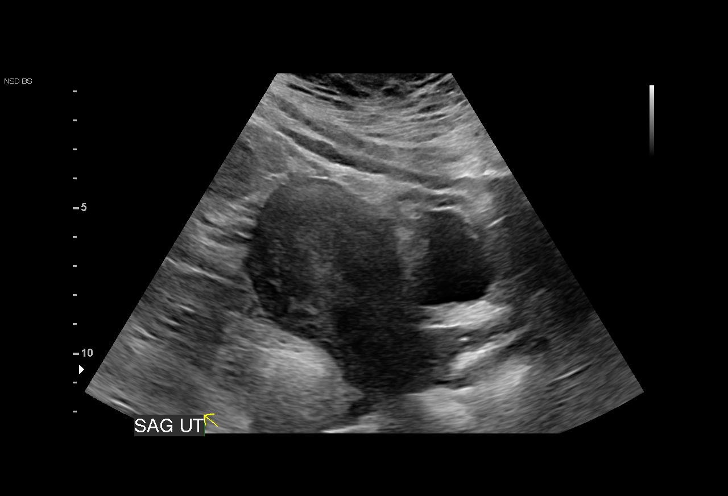
[im 3/39]
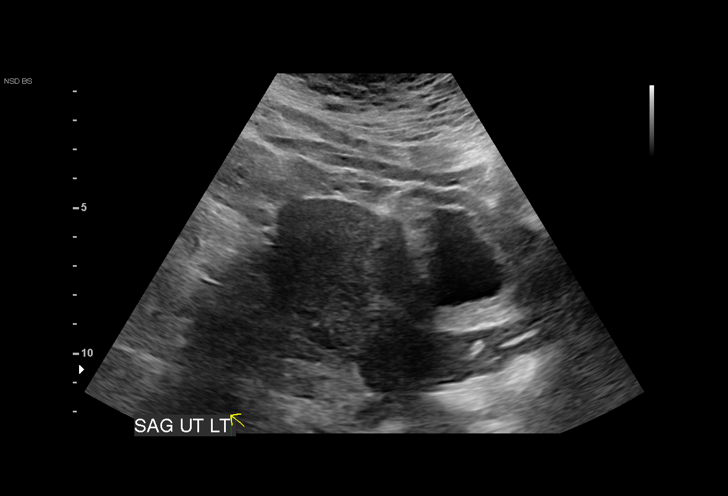
[im 6/39]
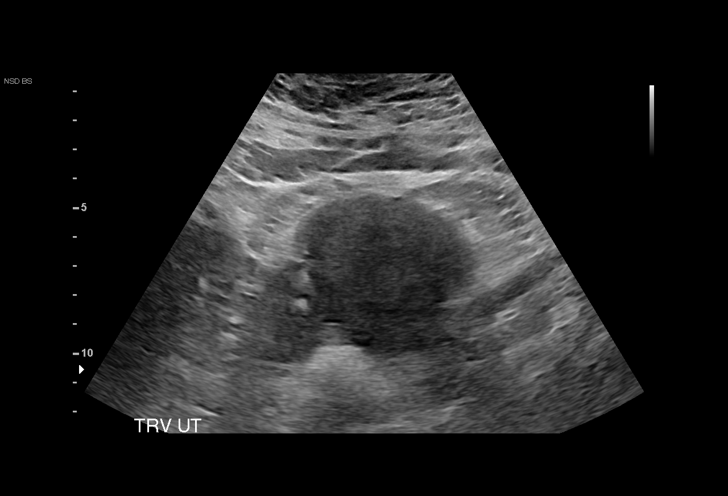
[im 9/39]
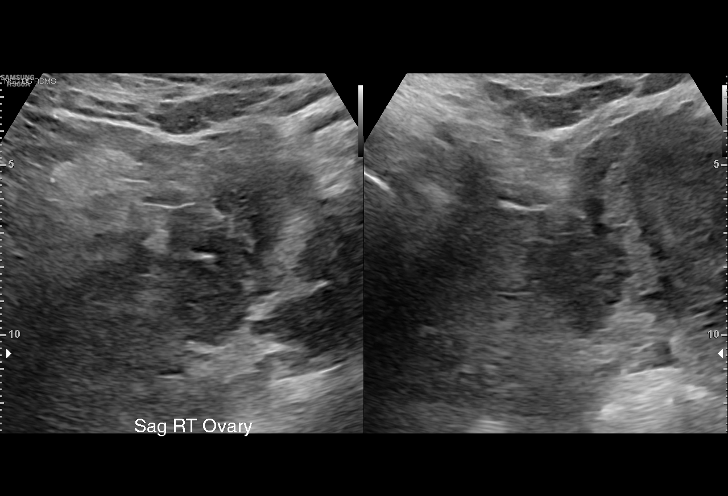
[im 12/39]
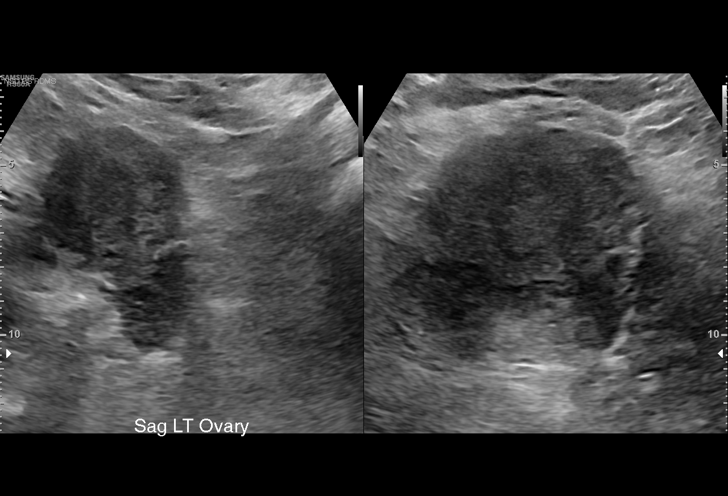
[im 15/39]
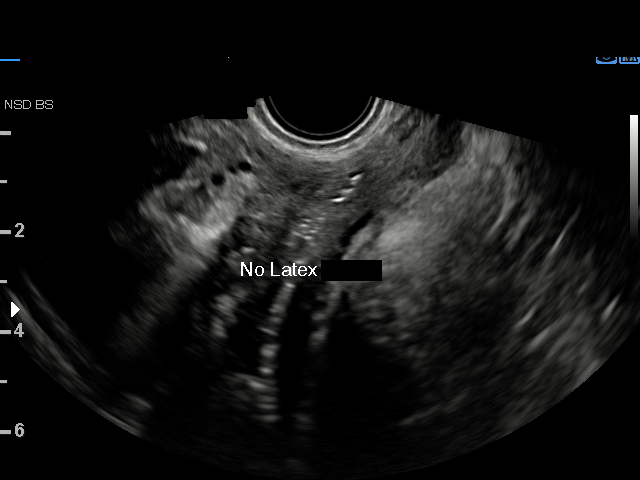
[im 17/39]
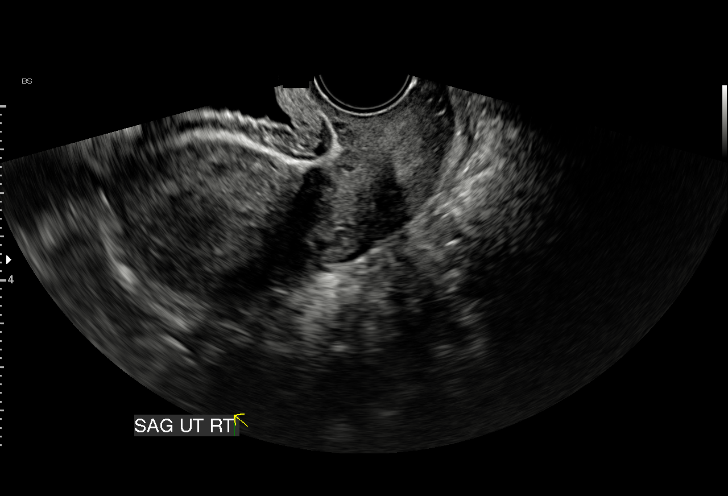
[im 20/39]
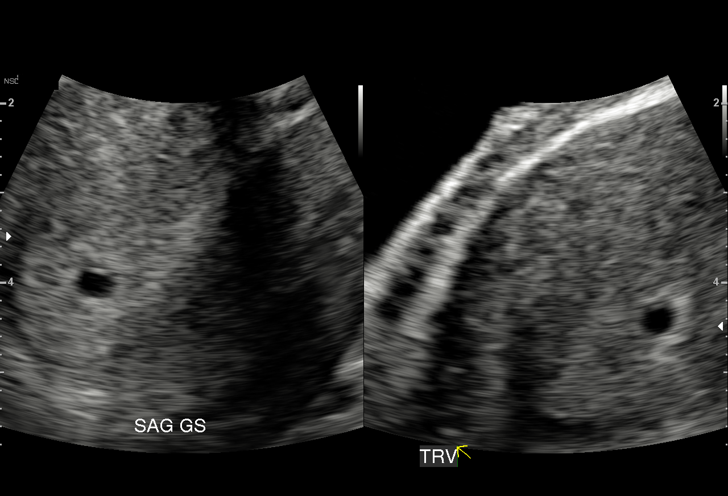
[im 22/39]
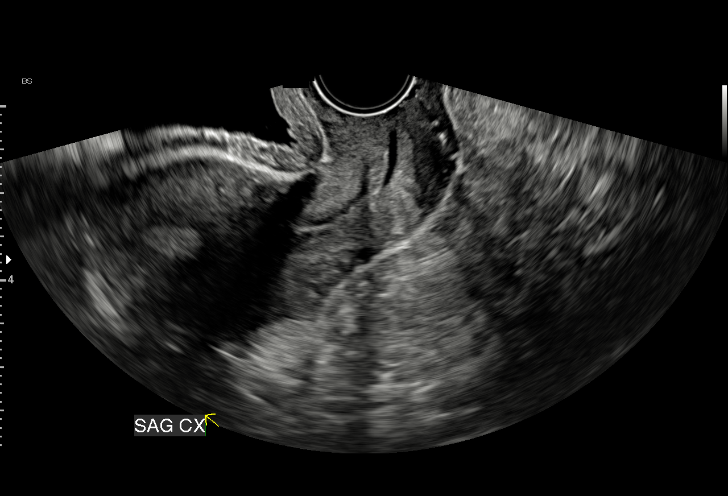
[im 24/39]
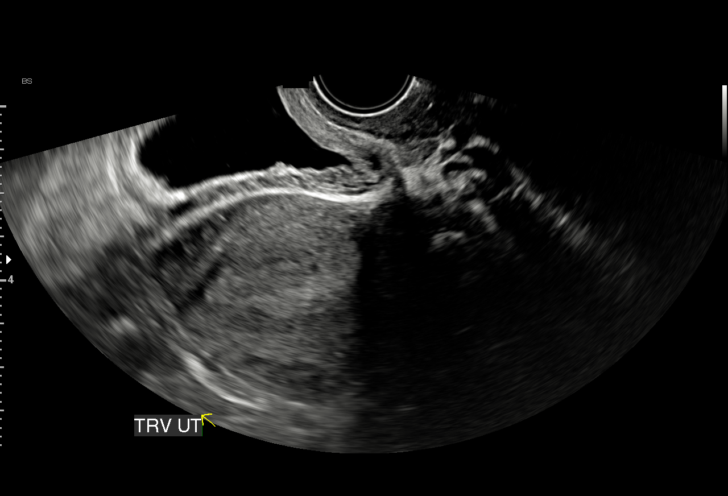
[im 27/39]
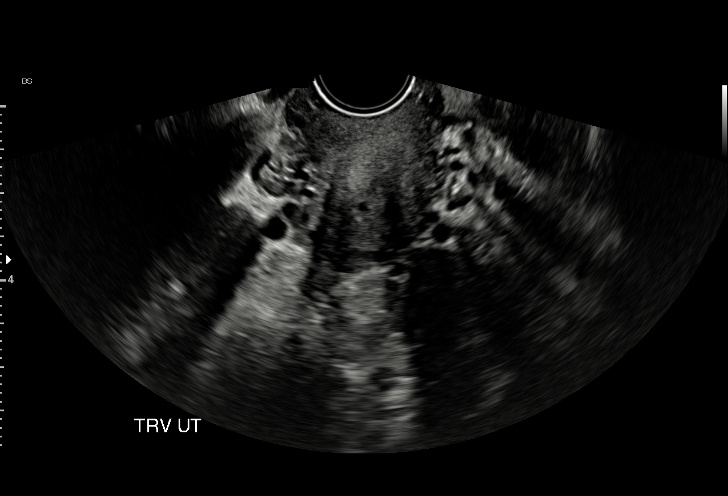
[im 30/39]
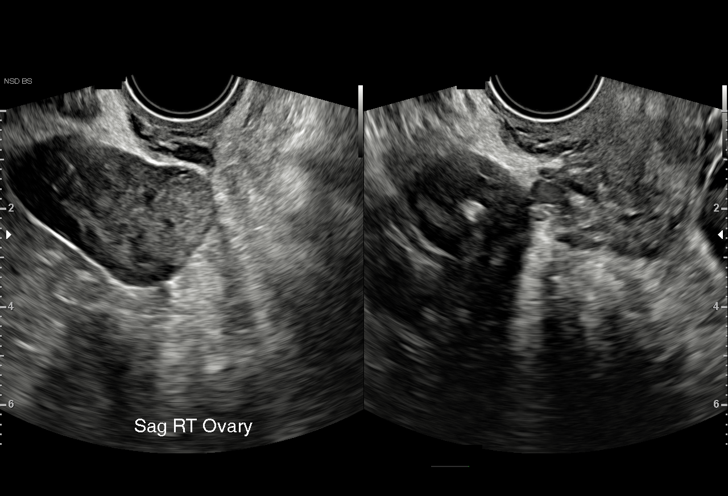
[im 33/39]
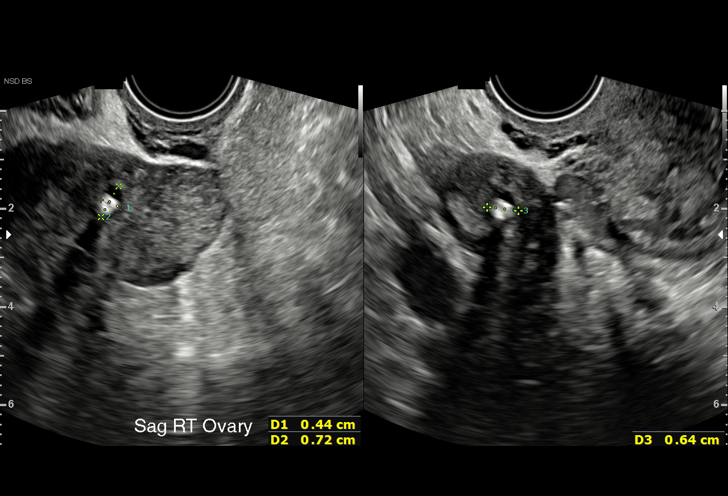
[im 36/39]
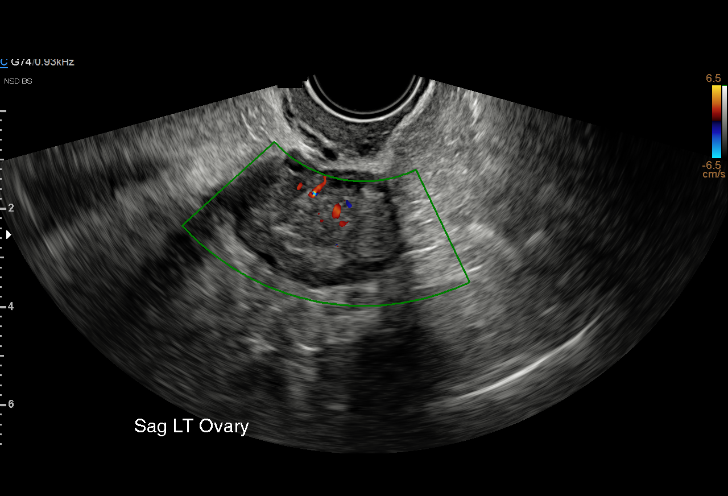
[im 39/39]
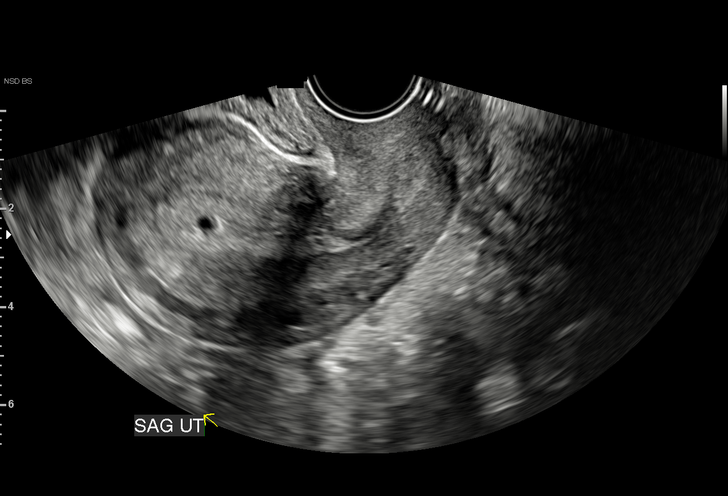

[15 of 28 positions shown; findings below may reference images not displayed]

FINDINGS: Intrauterine gestational sac: Single

Yolk sac:  Not Visualized.

Embryo:  Not Visualized.

MSD: 3.3 mm   5 w   0 d

Subchorionic hemorrhage:  None visualized.

Maternal uterus/adnexae: Normal appearing ovaries. No free fluid in
the cul-de-sac.
IMPRESSION: Probable early intrauterine gestational sac, but no yolk sac, fetal
pole, or cardiac activity yet visualized. Recommend follow-up
quantitative B-HCG levels and follow-up US in 14 days to assess
viability. This recommendation follows SRU consensus guidelines:
Diagnostic Criteria for Nonviable Pregnancy Early in the First
Trimester. N Engl J Med [ZR]; [DATE].

## 2020-06-13 NOTE — MAU Provider Note (Addendum)
History     CSN: [id number]  Arrival date and time: 06/13/20 4627   First Provider Initiated Contact with Patient 06/13/20 1915      Chief Complaint  Patient presents with  . Abdominal Pain  . Vaginal Bleeding   HPI Alexandra Gray is a 29 y.o. G3P2002 at 100w2d who presents with abdominal pain after a fall. She states she landed on her abdomen and has had pain since. She also reports some spotting. She denies any discharge. She has not had any evaluation of the pregnancy yet.   OB History    Gravida  3   Para  2   Term  2   Preterm      AB      Living  2     SAB      TAB      Ectopic      Multiple  0   Live Births  2           Past Medical History:  Diagnosis Date  . Chlamydia   . Depression    doing ok now  . Gestational diabetes   . Gonorrhea   . History of pregnancy induced hypertension 12/06/2016  . Hx of trichomoniasis   . Hypertension    sometimes is up, never on meds  . Mental disorder   . Ovarian cyst   . PID (pelvic inflammatory disease)   . Sleep apnea     Past Surgical History:  Procedure Laterality Date  . CESAREAN SECTION N/A 10/03/2015   Procedure: CESAREAN SECTION;  Surgeon: Tilda Burrow, MD;  Location: [facility];  Service: Obstetrics;  Laterality: N/A;  . CESAREAN SECTION N/A 01/04/2019   Procedure: CESAREAN SECTION;  Surgeon: Hermina Staggers, MD;  Location: [facility] BIRTHING SUITES;  Service: Obstetrics;  Laterality: N/A;  . KNEE SURGERY      Family History  Problem Relation Age of Onset  . Healthy Mother   . Healthy Father   . Healthy Brother   . Cancer Neg Hx   . Diabetes Neg Hx   . Heart disease Neg Hx   . Hypertension Neg Hx   . Stroke Neg Hx   . Hearing loss Neg Hx     Social History   Tobacco Use  . Smoking status: Never Smoker  . Smokeless tobacco: Never Used  Vaping Use  . Vaping Use: Never used  Substance Use Topics  . Alcohol use: No  . Drug use: No    Allergies:  Allergies  Allergen Reactions  .  Infuvite Adult [Multiple Vitamin] Shortness Of Breath  . Nubain [Nalbuphine Hcl] Other (See Comments)    Makes patient hot    Medications Prior to Admission  Medication Sig Dispense Refill Last Dose  . metoCLOPramide (REGLAN) 10 MG tablet Take 1 tablet (10 mg total) by mouth every 8 (eight) hours as needed for nausea or vomiting (or headache). 30 tablet 0 06/12/2020 at Unknown time  . sertraline (ZOLOFT) 100 MG tablet Take 100 mg by mouth daily after breakfast.   06/12/2020 at Unknown time    Review of Systems  Constitutional: Negative.  Negative for fatigue and fever.  HENT: Negative.   Respiratory: Negative.  Negative for shortness of breath.   Cardiovascular: Negative.  Negative for chest pain.  Gastrointestinal: Positive for abdominal pain. Negative for constipation, diarrhea, nausea and vomiting.  Genitourinary: Positive for vaginal bleeding. Negative for dysuria and vaginal discharge.  Neurological: Negative.  Negative for dizziness and  headaches.   Physical Exam   Blood pressure 127/69, pulse 100, temperature 99.6 F (37.6 C), temperature source Oral, resp. rate 20, height 5' (1.524 m), weight (!) 146.8 kg, last menstrual period 04/23/2020, SpO2 97 %, not currently breastfeeding.  Physical Exam Vitals and nursing note reviewed.  Constitutional:      General: She is not in acute distress.    Appearance: She is well-developed.  HENT:     Head: Normocephalic.  Eyes:     Pupils: Pupils are equal, round, and reactive to light.  Cardiovascular:     Rate and Rhythm: Normal rate and regular rhythm.     Heart sounds: Normal heart sounds.  Pulmonary:     Effort: Pulmonary effort is normal. No respiratory distress.     Breath sounds: Normal breath sounds.  Abdominal:     General: Bowel sounds are normal. There is no distension.     Palpations: Abdomen is soft.     Tenderness: There is no abdominal tenderness.  Skin:    General: Skin is warm and dry.  Neurological:     Mental  Status: She is alert and oriented to person, place, and time.  Psychiatric:        Behavior: Behavior normal.        Thought Content: Thought content normal.        Judgment: Judgment normal.     MAU Course  Procedures Results for orders placed or performed during the hospital encounter of 06/13/20 (from the past 24 hour(s))  Wet prep, genital     Status: Abnormal   Collection Time: 06/13/20  7:23 PM   Specimen: Vaginal  Result Value Ref Range   Yeast Wet Prep HPF POC NONE SEEN NONE SEEN   Trich, Wet Prep NONE SEEN NONE SEEN   Clue Cells Wet Prep HPF POC NONE SEEN NONE SEEN   WBC, Wet Prep HPF POC MANY (A) NONE SEEN   Sperm NONE SEEN   CBC     Status: Abnormal   Collection Time: 06/13/20  7:32 PM  Result Value Ref Range   WBC 12.3 (H) 4.0 - 10.5 K/uL   RBC 4.71 3.87 - 5.11 MIL/uL   Hemoglobin 13.1 12.0 - 15.0 g/dL   HCT 25.3 36 - 46 %   MCV 84.3 80.0 - 100.0 fL   MCH 27.8 26.0 - 34.0 pg   MCHC 33.0 30.0 - 36.0 g/dL   RDW 66.4 40.3 - 47.4 %   Platelets 287 150 - 400 K/uL   nRBC 0.0 0.0 - 0.2 %     No results found.  MDM UA, UPT CBC, HCG ABO/Rh- A Pos Wet prep and gc/chlamydia US OB Comp Less 14 weeks with Transvaginal  Care turned over to Lilyan Punt NP at 2000. Rolm Bookbinder, CNM 06/13/20 7:57 PM  LABS Results for orders placed or performed during the hospital encounter of 06/13/20 (from the past 24 hour(s))  Urinalysis, Routine w reflex microscopic     Status: Abnormal   Collection Time: 06/13/20  7:23 PM  Result Value Ref Range   Color, Urine YELLOW YELLOW   APPearance HAZY (A) CLEAR   Specific Gravity, Urine 1.023 1.005 - 1.030   pH 6.0 5.0 - 8.0   Glucose, UA NEGATIVE NEGATIVE mg/dL   Hgb urine dipstick MODERATE (A) NEGATIVE   Bilirubin Urine NEGATIVE NEGATIVE   Ketones, ur NEGATIVE NEGATIVE mg/dL   Protein, ur NEGATIVE NEGATIVE mg/dL   Nitrite NEGATIVE NEGATIVE   Leukocytes,Ua NEGATIVE  NEGATIVE   RBC / HPF 0-5 0 - 5 RBC/hpf   WBC, UA 0-5 0 - 5  WBC/hpf   Bacteria, UA RARE (A) NONE SEEN   Squamous Epithelial / LPF 11-20 0 - 5   Mucus PRESENT   Wet prep, genital     Status: Abnormal   Collection Time: 06/13/20  7:23 PM   Specimen: Vaginal  Result Value Ref Range   Yeast Wet Prep HPF POC NONE SEEN NONE SEEN   Trich, Wet Prep NONE SEEN NONE SEEN   Clue Cells Wet Prep HPF POC NONE SEEN NONE SEEN   WBC, Wet Prep HPF POC MANY (A) NONE SEEN   Sperm NONE SEEN   CBC     Status: Abnormal   Collection Time: 06/13/20  7:32 PM  Result Value Ref Range   WBC 12.3 (H) 4.0 - 10.5 K/uL   RBC 4.71 3.87 - 5.11 MIL/uL   Hemoglobin 13.1 12.0 - 15.0 g/dL   HCT 16.139.7 36 - 46 %   MCV 84.3 80.0 - 100.0 fL   MCH 27.8 26.0 - 34.0 pg   MCHC 33.0 30.0 - 36.0 g/dL   RDW 09.614.0 04.511.5 - 40.915.5 %   Platelets 287 150 - 400 K/uL   nRBC 0.0 0.0 - 0.2 %  hCG, quantitative, pregnancy     Status: Abnormal   Collection Time: 06/13/20  7:32 PM  Result Value Ref Range   hCG, Beta Chain, Quant, S 575 (H) <5 mIU/mL    Ultrasound: CLINICAL DATA:  Post fall cramping positive beta HCG  EXAM: OBSTETRIC <14 WK US AND TRANSVAGINAL OB US  TECHNIQUE: Both transabdominal and transvaginal ultrasound examinations were performed for complete evaluation of the gestation as well as the maternal uterus, adnexal regions, and pelvic cul-de-sac. Transvaginal technique was performed to assess early pregnancy.  COMPARISON:  None.  FINDINGS: Intrauterine gestational sac: Single  Yolk sac:  Not Visualized.  Embryo:  Not Visualized.  MSD: 3.3 mm   5 w   0 d  Subchorionic hemorrhage:  None visualized.  Maternal uterus/adnexae: Normal appearing ovaries. No free fluid in the cul-de-sac.  IMPRESSION: Probable early intrauterine gestational sac, but no yolk sac, fetal pole, or cardiac activity yet visualized. Recommend follow-up quantitative B-HCG levels and follow-up US in 14 days to assess viability. This recommendation follows SRU consensus  guidelines: Diagnostic Criteria for Nonviable Pregnancy Early in the First Trimester. Malva Limes Engl J Med 2013; 811:9147-82; 369:1443-51.  Discussed with client the diagnosis of pregnancy of unknown anatomic location.  Three possibilities of outcome are: a healthy pregnancy that is too early to see a yolk sac to confirm the pregnancy is in the uterus, a pregnancy that is not healthy and has not developed and will not develop, and an ectopic pregnancy that is in the abdomen that cannot be identified at this time.  And ectopic pregnancy can be a life threatening situation as a pregnancy needs to be in the uterus which is a muscle and can stretch to accommodate the growth of a pregnancy.  Other structures in the pelvis and abdomen as not muscular and do not stretch with the growth of a pregnancy.  Worst case scenario is that a structure ruptures with a growing pregnancy not in the uterus and and internal hemorrhage can be a life threatening situation.  We need to follow the progression of this pregnancy carefully.  We need to check another serum pregnancy hormone level to determine if the levels are rising appropriately  and  to determine the next steps that are needed for you.  An appointment was made for follow up on Wednesday for repeat BHCG lab work.  Patient's questions were answered.  Assessment and Plan  Pregnancy of unknown anatomic location Blood Type A positive  Plan Will schedule for repeat labs on Wednesday at [facility] - unable to schedule at [facility] due to computer screens not able to schedule the appointment.  Nolene Bernheim, RN, MSN, NP-BC Nurse Practitioner, [facility] for [employer], [facility] 06/13/2020 9:19 PM

## 2020-06-13 NOTE — MAU Note (Signed)
Alexandra Gray is a 29 y.o. at [redacted]w[redacted]d here in MAU reporting: states she fell earlier and landed on her abdomen. Is having lower abdominal pain. Having some spotting since the fall occurred. No abnormal discharge. No recent IC.  Onset of complaint: today  Pain score: 5/10  Vitals:   06/13/20 1847  BP: 127/69  Pulse: 100  Resp: 20  Temp: 99.6 F (37.6 C)  SpO2: 97%     Lab orders placed from triage: UA

## 2020-06-13 NOTE — Discharge Instructions (Signed)
Return to MAU with severe vaginal bleeding or severe abdominal pain.  Patient informed that at this time the pregnancy cannot be confirmed to be in the uterus as no yolk sac is visualized on ultrasound.  Advised to return with worsening symptoms as an ectopic pregnancy cannot be ruled out at this time and this could be a life threatening condition. Pelvic rest - no tampons, no douching, no sex, nothing in the vagina. No heavy lifting or strenuous exercise.

## 2020-06-14 ENCOUNTER — Encounter (HOSPITAL_COMMUNITY): Payer: Self-pay | Admitting: Obstetrics & Gynecology

## 2020-06-14 ENCOUNTER — Other Ambulatory Visit: Payer: Self-pay

## 2020-06-14 ENCOUNTER — Inpatient Hospital Stay (HOSPITAL_COMMUNITY)
Admission: AD | Admit: 2020-06-14 | Discharge: 2020-06-14 | Disposition: A | Discharge status: Home / Self Care | Payer: Medicaid Other | Attending: Obstetrics & Gynecology | Admitting: Obstetrics & Gynecology

## 2020-06-14 ENCOUNTER — Inpatient Hospital Stay (HOSPITAL_COMMUNITY): Payer: Medicaid Other

## 2020-06-14 ENCOUNTER — Telehealth: Payer: Self-pay

## 2020-06-14 DIAGNOSIS — O039 Complete or unspecified spontaneous abortion without complication: Secondary | ICD-10-CM | POA: Diagnosis present

## 2020-06-14 DIAGNOSIS — Z3A01 Less than 8 weeks gestation of pregnancy: Secondary | ICD-10-CM | POA: Diagnosis not present

## 2020-06-14 DIAGNOSIS — O99341 Other mental disorders complicating pregnancy, first trimester: Secondary | ICD-10-CM | POA: Insufficient documentation

## 2020-06-14 DIAGNOSIS — Z79899 Other long term (current) drug therapy: Secondary | ICD-10-CM | POA: Diagnosis not present

## 2020-06-14 DIAGNOSIS — Z6790 Unspecified blood type, Rh positive: Secondary | ICD-10-CM | POA: Insufficient documentation

## 2020-06-14 DIAGNOSIS — F329 Major depressive disorder, single episode, unspecified: Secondary | ICD-10-CM | POA: Diagnosis not present

## 2020-06-14 LAB — CBC
HCT: 41.5 % (ref 36.0–46.0)
Hemoglobin: 13.3 g/dL (ref 12.0–15.0)
MCH: 26.9 pg (ref 26.0–34.0)
MCHC: 32 g/dL (ref 30.0–36.0)
MCV: 83.8 fL (ref 80.0–100.0)
Platelets: 289 10*3/uL (ref 150–400)
RBC: 4.95 MIL/uL (ref 3.87–5.11)
RDW: 14.2 % (ref 11.5–15.5)
WBC: 12 10*3/uL — ABNORMAL HIGH (ref 4.0–10.5)
nRBC: 0 % (ref 0.0–0.2)

## 2020-06-14 LAB — HCG, QUANTITATIVE, PREGNANCY: hCG, Beta Chain, Quant, S: 544 m[IU]/mL — ABNORMAL HIGH (ref <5)

## 2020-06-14 LAB — GC/CHLAMYDIA PROBE AMP (~~LOC~~) NOT AT ARMC
Chlamydia: NEGATIVE
Comment: NEGATIVE
Comment: NORMAL
Neisseria Gonorrhea: NEGATIVE

## 2020-06-14 IMAGING — US US OB TRANSVAGINAL
1 series · 15 of 28 positions shown · non-contrast
Comparison: [DATE]

CLINICAL DATA: Vaginal bleeding.

EXAM:
OBSTETRIC <14 WK US AND TRANSVAGINAL OB US
TECHNIQUE: Both transabdominal and transvaginal ultrasound examinations were
performed for complete evaluation of the gestation as well as the
maternal uterus, adnexal regions, and pelvic cul-de-sac.
Transvaginal technique was performed to assess early pregnancy.

[Series 1: us ob transvaginal · 15 of 35 slices shown]
[im 1/35]
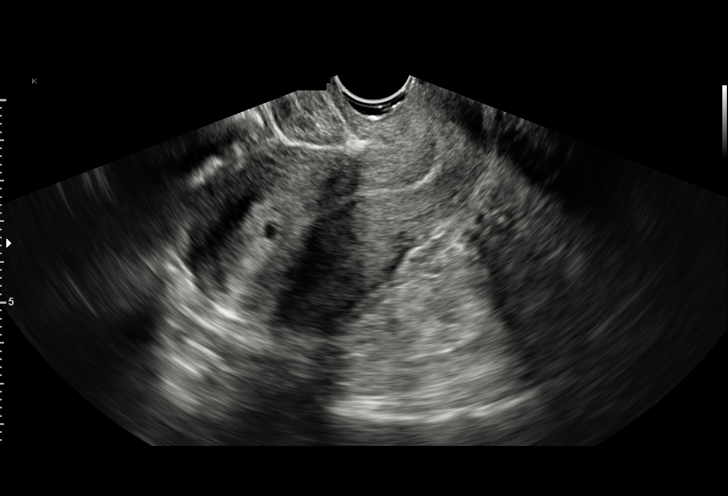
[im 3/35]
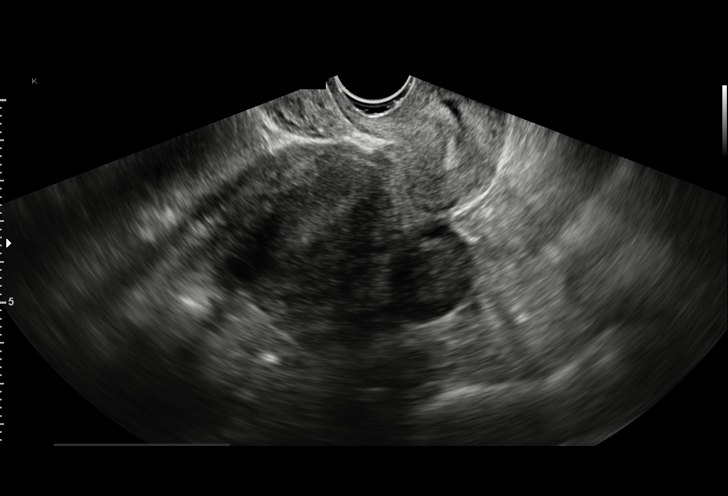
[im 6/35]
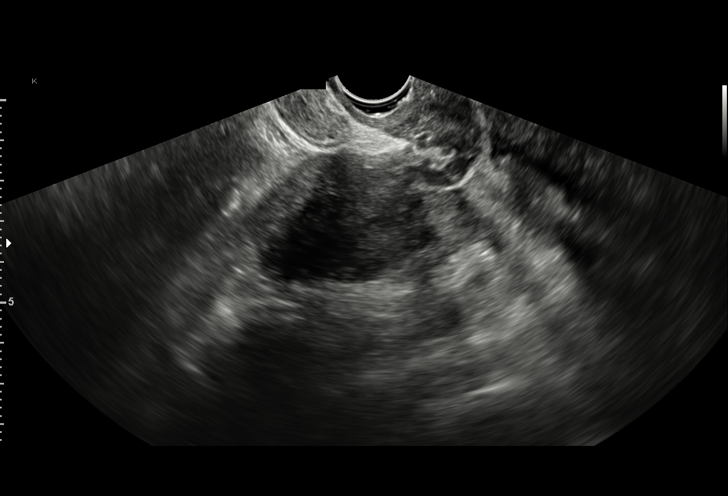
[im 8/35]
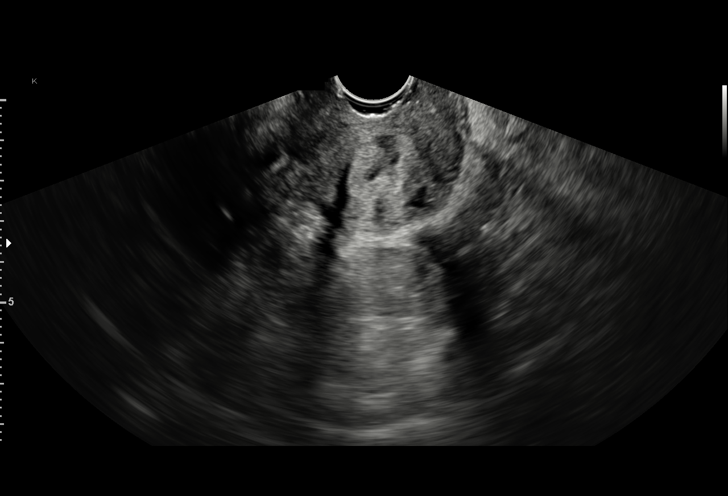
[im 11/35]
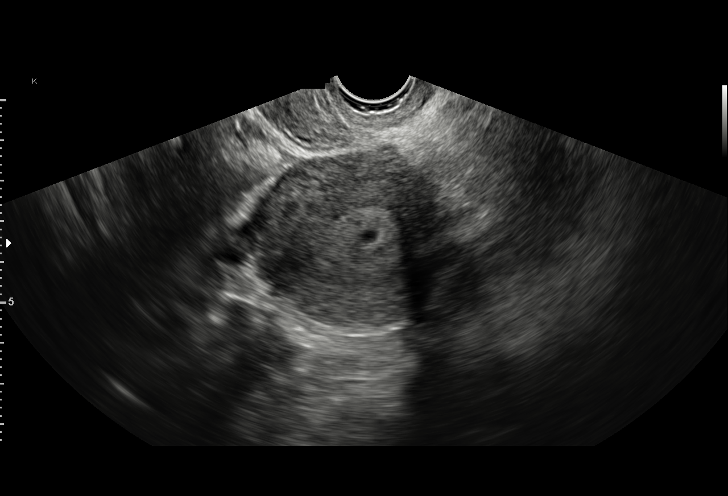
[im 13/35]
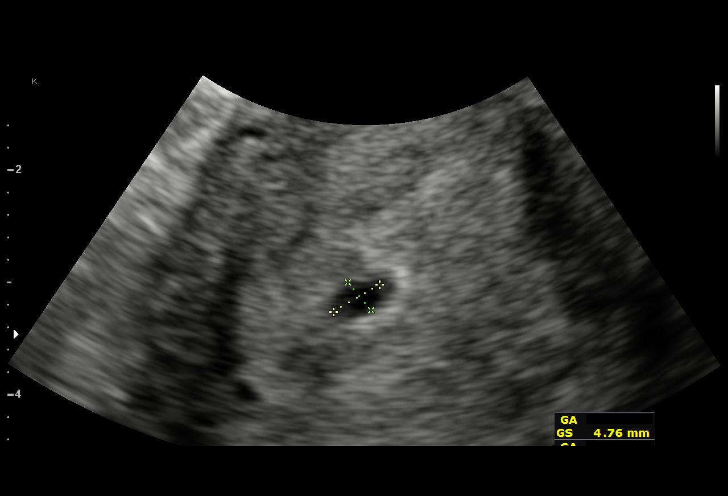
[im 16/35]
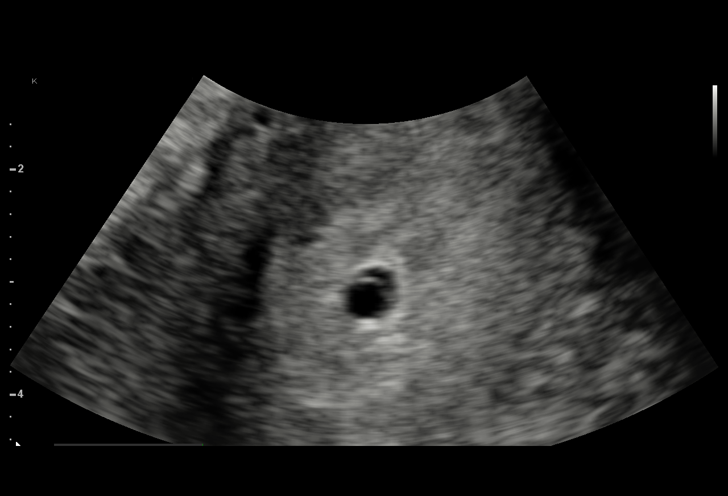
[im 18/35]
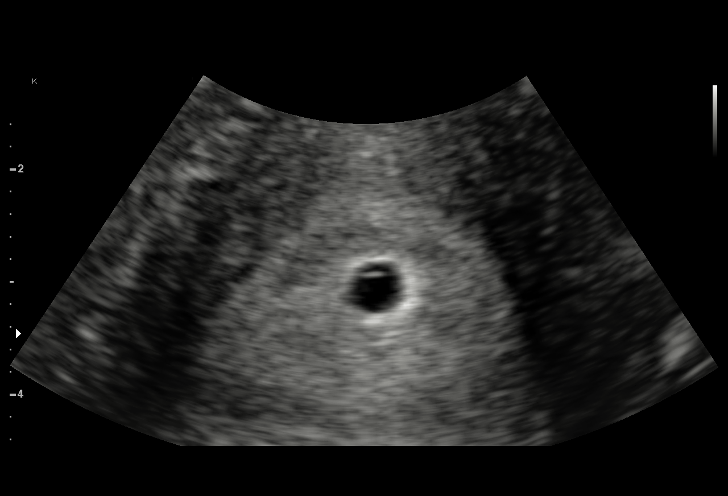
[im 19/35]
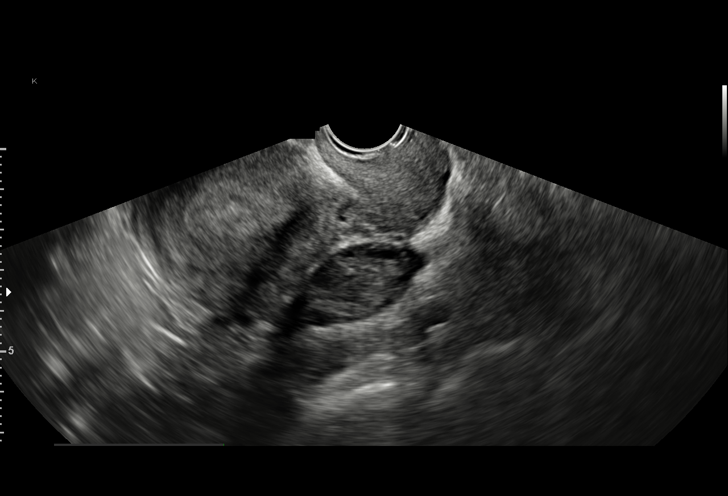
[im 22/35]
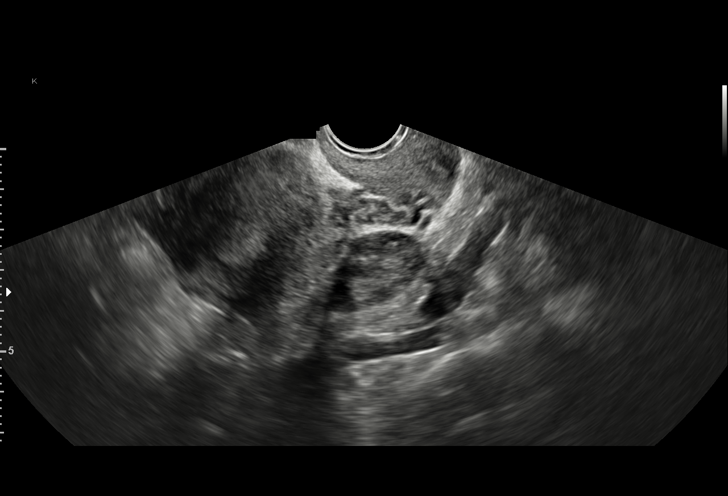
[im 24/35]
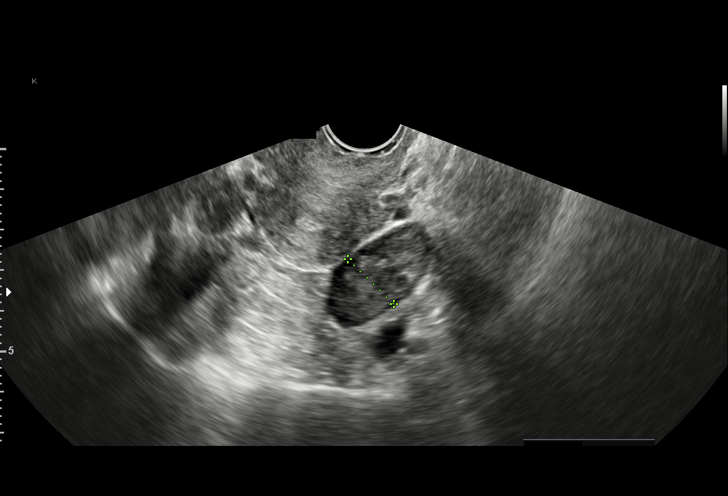
[im 27/35]
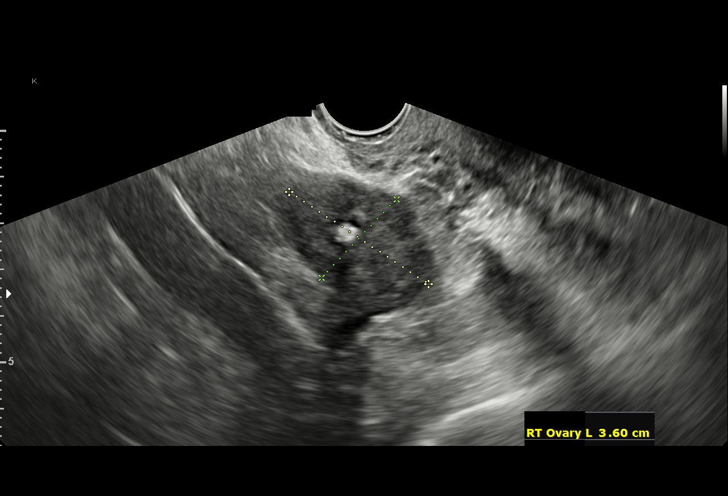
[im 29/35]
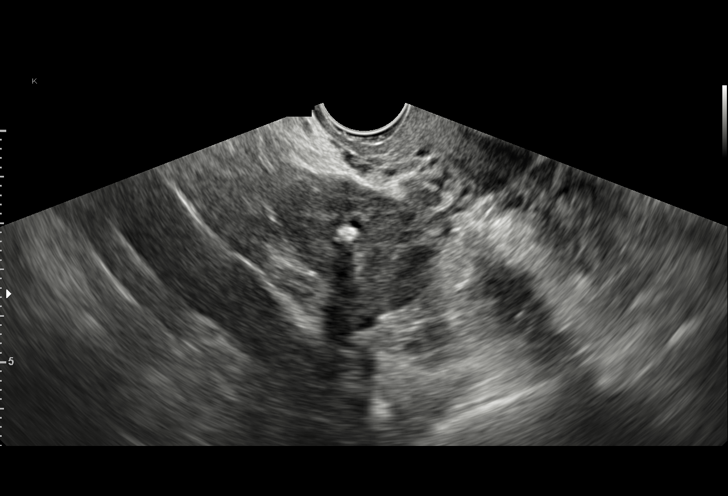
[im 32/35]
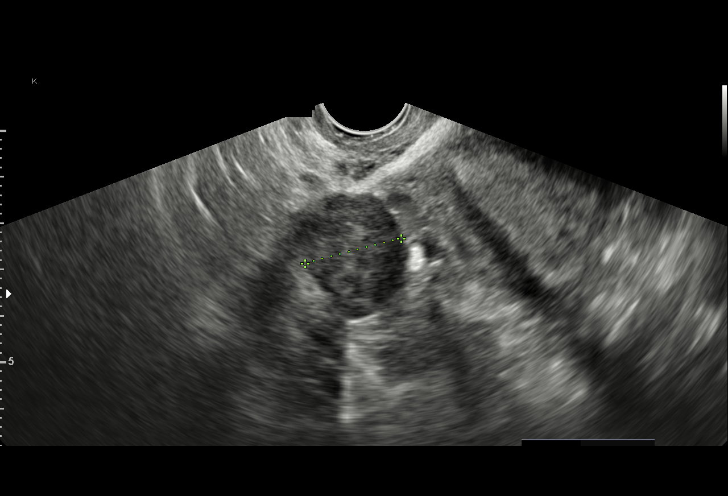
[im 35/35]
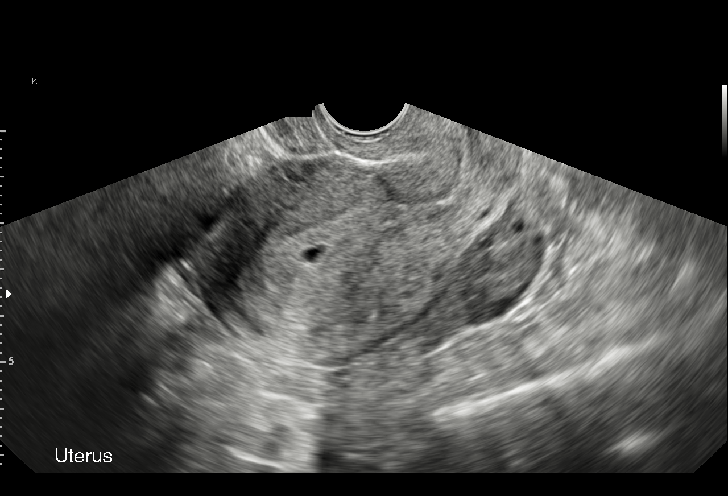

[15 of 28 positions shown; findings below may reference images not displayed]

FINDINGS: It should be noted that the study is limited secondary to the
patient's large body habitus.

Intrauterine gestational sac: Single

Yolk sac:  Visualized.

Embryo:  Not Visualized.

Cardiac Activity: Not Visualized.

Heart Rate: N/A  bpm

MSD: 4.1 mm   5 w   1 d

Subchorionic hemorrhage:  None visualized.

Maternal uterus/adnexae:

A 5 mm shadowing echogenic focus is seen within an otherwise normal
appearing right ovary.

The left ovary is visualized and is normal in appearance.

No free fluid is seen.
IMPRESSION: Single intrauterine gestational sac and yolk sac, at approximately 5
weeks and 1 day gestation by ultrasound evaluation, without
visualization of a fetal pole. While this may be secondary to early
intrauterine pregnancy, correlation with follow-up pelvic ultrasound
is recommended.

## 2020-06-14 NOTE — Discharge Instructions (Signed)
Miscarriage A miscarriage is the loss of an unborn baby (fetus) before the 20th week of pregnancy. Follow these instructions at home: Medicines   Take over-the-counter and prescription medicines only as told by your doctor.  If you were prescribed antibiotic medicine, take it as told by your doctor. Do not stop taking the antibiotic even if you start to feel better.  Do not take NSAIDs unless your doctor says that this is safe for you. NSAIDs include aspirin and ibuprofen. These medicines can cause bleeding. Activity  Rest as directed. Ask your doctor what activities are safe for you.  Have someone help you at home during this time. General instructions  Write down how many pads you use each day and how soaked they are.  Watch the amount of tissue or clumps of blood (blood clots) that you pass from your vagina. Save any large amounts of tissue for your doctor.  Do not use tampons, douche, or have sex until your doctor approves.  To help you and your partner with the process of grieving, talk with your doctor or seek counseling.  When you are ready, meet with your doctor to talk about steps you should take for your health. Also, talk with your doctor about steps to take to have a healthy pregnancy in the future.  Keep all follow-up visits as told by your doctor. This is important. Contact a doctor if:  You have a fever or chills.  You have vaginal discharge that smells bad.  You have more bleeding. Get help right away if:  You have very bad cramps or pain in your back or belly.  You pass clumps of blood that are walnut-sized or larger from your vagina.  You pass tissue that is walnut-sized or larger from your vagina.  You soak more than 1 regular pad in an hour.  You get light-headed or weak.  You faint (pass out).  You have feelings of sadness that do not go away, or you have thoughts of hurting yourself. Summary  A miscarriage is the loss of an unborn baby before  the 20th week of pregnancy.  Follow your doctor's instructions for home care. Keep all follow-up appointments.  To help you and your partner with the process of grieving, talk with your doctor or seek counseling. This information is not intended to replace advice given to you by your health care provider. Make sure you discuss any questions you have with your health care provider. Document Revised: 03/07/2019 Document Reviewed: 12/19/2016 Elsevier Patient Education  2020 Elsevier Inc.  

## 2020-06-14 NOTE — Telephone Encounter (Signed)
Patient called stating that she notice a light pink streak of blood when she went to the bathroom earlier. She states that this bleeding is better than what she noticed yesterday when she reported to MAU after having a fall. Patient denies having sexual intercourse recently. She also denies having any cramping at this time.  According to U/S report completed at MAU yesterday patient was dated as 5 weeks. I explained to patient that this could be considered implantation bleeding which can occur early in pregnancy. I advised patient to continue to monitor bleeding and for cramping. If sx get worse then she needs to go back to MAU for evaluation.  Patient is scheduled to have a repeat HCG quant done on 7/21. I also informed patient that these results can help [place] to determine if the pregnancy is doing what it should. I also informed her that it would also left [place] know if the pregnancy is a failed pregnancy. She verbalized understanding and states that I clarified things for her.

## 2020-06-14 NOTE — MAU Provider Note (Signed)
History     CSN: [id number]  Arrival date and time: 06/14/20 2050   First Provider Initiated Contact with Patient 06/14/20 2149     29 y.o. [id number] @[redacted]w[redacted]d  by LMP presenting with VB. Reports onset yesterday after a fall. She was seen in the MAU last night. States bleeding had stopped but then she saw bright red blood when she wiped tonight. She has not needed a pad. Denies pain or cramping. No recent sex. Reports losing her dad just a few hours ago, was in hospice care.    OB History    Gravida  3   Para  2   Term  2   Preterm      AB      Living  2     SAB      TAB      Ectopic      Multiple  0   Live Births  2           Past Medical History:  Diagnosis Date  . Chlamydia   . Depression    doing ok now  . Gestational diabetes   . Gonorrhea   . History of pregnancy induced hypertension 12/06/2016  . Hx of trichomoniasis   . Hypertension    sometimes is up, never on meds  . Mental disorder   . Ovarian cyst   . PID (pelvic inflammatory disease)   . Sleep apnea     Past Surgical History:  Procedure Laterality Date  . CESAREAN SECTION N/A 10/03/2015   Procedure: CESAREAN SECTION;  Surgeon: 13/04/2015, MD;  Location: [facility];  Service: Obstetrics;  Laterality: N/A;  . CESAREAN SECTION N/A 01/04/2019   Procedure: CESAREAN SECTION;  Surgeon: 03/05/2019, MD;  Location: [facility] BIRTHING SUITES;  Service: Obstetrics;  Laterality: N/A;  . KNEE SURGERY      Family History  Problem Relation Age of Onset  . Healthy Mother   . Healthy Father   . Healthy Brother   . Cancer Neg Hx   . Diabetes Neg Hx   . Heart disease Neg Hx   . Hypertension Neg Hx   . Stroke Neg Hx   . Hearing loss Neg Hx     Social History   Tobacco Use  . Smoking status: Never Smoker  . Smokeless tobacco: Never Used  Vaping Use  . Vaping Use: Never used  Substance Use Topics  . Alcohol use: No  . Drug use: No    Allergies:  Allergies  Allergen Reactions  . Infuvite Adult  [Multiple Vitamin] Shortness Of Breath  . Nubain [Nalbuphine Hcl] Other (See Comments)    Makes patient hot    Medications Prior to Admission  Medication Sig Dispense Refill Last Dose  . metoCLOPramide (REGLAN) 10 MG tablet Take 1 tablet (10 mg total) by mouth every 8 (eight) hours as needed for nausea or vomiting (or headache). 30 tablet 0 Past Week at Unknown time  . sertraline (ZOLOFT) 100 MG tablet Take 100 mg by mouth daily after breakfast.   06/14/2020 at Unknown time    Review of Systems  Gastrointestinal: Negative for abdominal pain.  Genitourinary: Positive for vaginal bleeding.   Physical Exam   Blood pressure (!) 140/96, pulse 88, temperature 98.5 F (36.9 C), temperature source Oral, resp. rate 16, last menstrual period 04/23/2020, not currently breastfeeding.  Physical Exam Vitals and nursing note reviewed. Exam conducted with a chaperone present.  Constitutional:      General: She  is not in acute distress. HENT:     Head: Normocephalic and atraumatic.  Genitourinary:    Comments: External: no lesions or erythema Vagina: rugated, pink, moist, scant bloody discharge, cleared with 1 fox swab Uterus/Adnexae: indeterminable d/t obesity Cervix closed  Musculoskeletal:        General: Normal range of motion.  Skin:    General: Skin is warm and dry.  Neurological:     General: No focal deficit present.     Mental Status: She is alert and oriented to person, place, and time.  Psychiatric:        Mood and Affect: Affect is tearful.    Results for orders placed or performed during the hospital encounter of 06/14/20 (from the past 24 hour(s))  CBC     Status: Abnormal   Collection Time: 06/14/20  9:30 PM  Result Value Ref Range   WBC 12.0 (H) 4.0 - 10.5 K/uL   RBC 4.95 3.87 - 5.11 MIL/uL   Hemoglobin 13.3 12.0 - 15.0 g/dL   HCT 28.4 36 - 46 %   MCV 83.8 80.0 - 100.0 fL   MCH 26.9 26.0 - 34.0 pg   MCHC 32.0 30.0 - 36.0 g/dL   RDW 13.2 44.0 - 10.2 %   Platelets  289 150 - 400 K/uL   nRBC 0.0 0.0 - 0.2 %  hCG, quantitative, pregnancy     Status: Abnormal   Collection Time: 06/14/20  9:30 PM  Result Value Ref Range   hCG, Beta Chain, Quant, S 544 (H) <5 mIU/mL   MAU Course  Procedures  MDM Chart review: qhcg yesterday 575, IUGS but no YS or FP seen on [place]. Labs ordered and reviewed. Qhcg down from yesterday, suspect failed pregnancy, [place] ordered. IUGS and YS seen on [place] but no FP. Consult with Dr. Despina Hidden, findings consistent with failed pregnancy. Condolences given. Discussed findings with pt and options given for medical vs expectant mngt, pt prefers expectant mngt for now but will call office if she decides to proceed with Cytotec. Stable for discharge home.  Assessment and Plan   1. Miscarriage   2. Blood type, Rh positive    Discharge home Follow up at [facility] in 1 week Bleeding/return precautions Tylenol or Ibuprofen prn  Allergies as of 06/14/2020      Reactions   Infuvite Adult [multiple Vitamin] Shortness Of Breath   Nubain [nalbuphine Hcl] Other (See Comments)   Makes patient hot      Medication List    TAKE these medications   metoCLOPramide 10 MG tablet Commonly known as: Reglan Take 1 tablet (10 mg total) by mouth every 8 (eight) hours as needed for nausea or vomiting (or headache).   sertraline 100 MG tablet Commonly known as: ZOLOFT Take 100 mg by mouth daily after breakfast.      Donette Larry, CNM 06/14/2020, 11:31 PM

## 2020-06-16 ENCOUNTER — Other Ambulatory Visit: Payer: Medicaid Other

## 2020-06-21 ENCOUNTER — Ambulatory Visit: Payer: Medicaid Other | Admitting: Obstetrics

## 2020-06-25 ENCOUNTER — Telehealth: Payer: Self-pay

## 2020-06-25 NOTE — Telephone Encounter (Signed)
Called both contact numbers to reach pt Mother is on DPR  Pt not ava and was not directed to vm  Pt mother will have pt contact office for appt to repeat HCG. Lab work per [employer].

## 2020-06-25 NOTE — Telephone Encounter (Signed)
-----   Message from Warden Fillers, MD sent at 06/24/2020  5:11 PM EDT ----- Regarding: RE: cytotec Rx /SAB Spoke with MAU, due to patient missing her visit and being overdue, we need to get repeat quant before prescribing.  She may not even need the medication anymore. ----- Message ----- From: Kennon Portela, CMA Sent: 06/24/2020   4:43 PM EDT To: Warden Fillers, MD Subject: cytotec Rx /SAB                                TC from pt regarding SAB pt had recent miscarriage pt was told to F/U with office.  Pt missed appt this past Monday w/ provider . Pt just called requesting Rx. I saw hospital note states pt to call is she wanted to follow through with cytotec.  Please advise pt pharmacy is the [facility] on file.

## 2020-06-29 ENCOUNTER — Other Ambulatory Visit: Payer: Medicaid Other

## 2020-06-29 ENCOUNTER — Other Ambulatory Visit: Payer: Self-pay

## 2020-06-29 DIAGNOSIS — O039 Complete or unspecified spontaneous abortion without complication: Secondary | ICD-10-CM

## 2020-06-29 NOTE — Progress Notes (Unsigned)
Lab needs order for lab only visit pt  

## 2020-06-30 LAB — BETA HCG QUANT (REF LAB): hCG Quant: <1 m[IU]/mL

## 2020-07-09 ENCOUNTER — Encounter: Payer: Medicaid Other | Admitting: Obstetrics

## 2020-11-03 ENCOUNTER — Other Ambulatory Visit: Payer: Self-pay | Admitting: Registered Nurse

## 2020-11-03 ENCOUNTER — Encounter (HOSPITAL_COMMUNITY): Payer: Self-pay

## 2020-11-03 ENCOUNTER — Ambulatory Visit (HOSPITAL_COMMUNITY)
Admission: EM | Admit: 2020-11-03 | Discharge: 2020-11-03 | Disposition: A | Discharge status: Other Acute Inpatient Hospital | Payer: Medicaid Other | Attending: Registered Nurse | Admitting: Registered Nurse

## 2020-11-03 ENCOUNTER — Inpatient Hospital Stay (HOSPITAL_COMMUNITY)
Admission: AD | Admit: 2020-11-03 | Discharge: 2020-11-08 | DRG: 885 | Disposition: A | Discharge status: Home / Self Care | Payer: Medicaid Other | Source: Intra-hospital | Attending: Emergency Medicine | Admitting: Emergency Medicine

## 2020-11-03 ENCOUNTER — Encounter (HOSPITAL_COMMUNITY): Payer: Self-pay | Admitting: Registered Nurse

## 2020-11-03 ENCOUNTER — Other Ambulatory Visit: Payer: Self-pay

## 2020-11-03 DIAGNOSIS — R4587 Impulsiveness: Secondary | ICD-10-CM | POA: Insufficient documentation

## 2020-11-03 DIAGNOSIS — F6089 Other specific personality disorders: Secondary | ICD-10-CM | POA: Diagnosis present

## 2020-11-03 DIAGNOSIS — F4320 Adjustment disorder, unspecified: Secondary | ICD-10-CM | POA: Diagnosis present

## 2020-11-03 DIAGNOSIS — F419 Anxiety disorder, unspecified: Secondary | ICD-10-CM | POA: Diagnosis not present

## 2020-11-03 DIAGNOSIS — Z9151 Personal history of suicidal behavior: Secondary | ICD-10-CM | POA: Diagnosis not present

## 2020-11-03 DIAGNOSIS — G4700 Insomnia, unspecified: Secondary | ICD-10-CM | POA: Insufficient documentation

## 2020-11-03 DIAGNOSIS — L03012 Cellulitis of left finger: Secondary | ICD-10-CM | POA: Diagnosis present

## 2020-11-03 DIAGNOSIS — Z9114 Patient's other noncompliance with medication regimen: Secondary | ICD-10-CM

## 2020-11-03 DIAGNOSIS — F332 Major depressive disorder, recurrent severe without psychotic features: Principal | ICD-10-CM | POA: Diagnosis present

## 2020-11-03 DIAGNOSIS — R450 Nervousness: Secondary | ICD-10-CM | POA: Insufficient documentation

## 2020-11-03 DIAGNOSIS — R0981 Nasal congestion: Secondary | ICD-10-CM | POA: Diagnosis present

## 2020-11-03 DIAGNOSIS — R45851 Suicidal ideations: Secondary | ICD-10-CM | POA: Diagnosis present

## 2020-11-03 DIAGNOSIS — Z20822 Contact with and (suspected) exposure to covid-19: Secondary | ICD-10-CM | POA: Diagnosis present

## 2020-11-03 DIAGNOSIS — F4321 Adjustment disorder with depressed mood: Secondary | ICD-10-CM

## 2020-11-03 DIAGNOSIS — O99210 Obesity complicating pregnancy, unspecified trimester: Secondary | ICD-10-CM | POA: Diagnosis present

## 2020-11-03 DIAGNOSIS — F4310 Post-traumatic stress disorder, unspecified: Secondary | ICD-10-CM | POA: Diagnosis present

## 2020-11-03 DIAGNOSIS — Z79899 Other long term (current) drug therapy: Secondary | ICD-10-CM

## 2020-11-03 DIAGNOSIS — Z6841 Body Mass Index (BMI) 40.0 and over, adult: Secondary | ICD-10-CM

## 2020-11-03 LAB — URINALYSIS, ROUTINE W REFLEX MICROSCOPIC
Bacteria, UA: NONE SEEN
Bilirubin Urine: NEGATIVE
Glucose, UA: NEGATIVE mg/dL
Hgb urine dipstick: NEGATIVE
Ketones, ur: NEGATIVE mg/dL
Leukocytes,Ua: NEGATIVE
Nitrite: POSITIVE — AB
Protein, ur: 30 mg/dL — AB
Specific Gravity, Urine: 1.023 (ref 1.005–1.030)
pH: 6 (ref 5.0–8.0)

## 2020-11-03 LAB — COMPREHENSIVE METABOLIC PANEL
ALT: 22 U/L (ref 0–44)
AST: 19 U/L (ref 15–41)
Albumin: 3.6 g/dL (ref 3.5–5.0)
Alkaline Phosphatase: 91 U/L (ref 38–126)
Anion gap: 13 (ref 5–15)
BUN: 9 mg/dL (ref 6–20)
CO2: 23 mmol/L (ref 22–32)
Calcium: 9.2 mg/dL (ref 8.9–10.3)
Chloride: 102 mmol/L (ref 98–111)
Creatinine, Ser: 0.69 mg/dL (ref 0.44–1.00)
GFR, Estimated: >60 mL/min (ref >60)
Glucose, Bld: 90 mg/dL (ref 70–99)
Potassium: 3.7 mmol/L (ref 3.5–5.1)
Sodium: 138 mmol/L (ref 135–145)
Total Bilirubin: 0.5 mg/dL (ref 0.3–1.2)
Total Protein: 7.8 g/dL (ref 6.5–8.1)

## 2020-11-03 LAB — LIPID PANEL
Cholesterol: 166 mg/dL (ref 0–200)
HDL: 42 mg/dL (ref >40)
LDL Cholesterol: 95 mg/dL (ref 0–99)
Total CHOL/HDL Ratio: 4 RATIO
Triglycerides: 144 mg/dL (ref <150)
VLDL: 29 mg/dL (ref 0–40)

## 2020-11-03 LAB — RESP PANEL BY RT-PCR (FLU A&B, COVID) ARPGX2
Influenza A by PCR: NEGATIVE
Influenza B by PCR: NEGATIVE
SARS Coronavirus 2 by RT PCR: NEGATIVE

## 2020-11-03 LAB — POC SARS CORONAVIRUS 2 AG: SARS Coronavirus 2 Ag: NEGATIVE

## 2020-11-03 LAB — TSH: TSH: 2.25 u[IU]/mL (ref 0.350–4.500)

## 2020-11-03 LAB — POCT URINE DRUG SCREEN - MANUAL ENTRY (I-SCREEN)
POC Amphetamine UR: NOT DETECTED
POC Buprenorphine (BUP): NOT DETECTED
POC Cocaine UR: NOT DETECTED
POC Marijuana UR: NOT DETECTED
POC Methadone UR: NOT DETECTED
POC Methamphetamine UR: NOT DETECTED
POC Morphine: NOT DETECTED
POC Oxazepam (BZO): NOT DETECTED
POC Oxycodone UR: NOT DETECTED
POC Secobarbital (BAR): NOT DETECTED

## 2020-11-03 LAB — CBC WITH DIFFERENTIAL/PLATELET
Abs Immature Granulocytes: 0.08 10*3/uL — ABNORMAL HIGH (ref 0.00–0.07)
Basophils Absolute: 0 10*3/uL (ref 0.0–0.1)
Basophils Relative: 0 %
Eosinophils Absolute: 0.4 10*3/uL (ref 0.0–0.5)
Eosinophils Relative: 4 %
HCT: 46.9 % — ABNORMAL HIGH (ref 36.0–46.0)
Hemoglobin: 14.7 g/dL (ref 12.0–15.0)
Immature Granulocytes: 1 %
Lymphocytes Relative: 25 %
Lymphs Abs: 2.9 10*3/uL (ref 0.7–4.0)
MCH: 26.3 pg (ref 26.0–34.0)
MCHC: 31.3 g/dL (ref 30.0–36.0)
MCV: 83.9 fL (ref 80.0–100.0)
Monocytes Absolute: 0.7 10*3/uL (ref 0.1–1.0)
Monocytes Relative: 6 %
Neutro Abs: 7.5 10*3/uL (ref 1.7–7.7)
Neutrophils Relative %: 64 %
Platelets: 353 10*3/uL (ref 150–400)
RBC: 5.59 MIL/uL — ABNORMAL HIGH (ref 3.87–5.11)
RDW: 14.2 % (ref 11.5–15.5)
WBC: 11.6 10*3/uL — ABNORMAL HIGH (ref 4.0–10.5)
nRBC: 0 % (ref 0.0–0.2)

## 2020-11-03 LAB — POCT PREGNANCY, URINE: Preg Test, Ur: NEGATIVE

## 2020-11-03 LAB — POC SARS CORONAVIRUS 2 AG -  ED: SARS Coronavirus 2 Ag: NEGATIVE

## 2020-11-03 LAB — HEMOGLOBIN A1C
Hgb A1c MFr Bld: 5.3 % (ref 4.8–5.6)
Mean Plasma Glucose: 105.41 mg/dL

## 2020-11-03 LAB — ETHANOL: Alcohol, Ethyl (B): <10 mg/dL (ref <10)

## 2020-11-03 LAB — MAGNESIUM: Magnesium: 2.3 mg/dL (ref 1.7–2.4)

## 2020-11-03 MED ORDER — QUETIAPINE FUMARATE 50 MG PO TABS
50.0000 mg | ORAL_TABLET | Freq: Once | ORAL | Status: AC
  Administered 2020-11-03: 50 mg via ORAL
  Filled 2020-11-03: qty 1

## 2020-11-03 MED ORDER — HYDROXYZINE HCL 25 MG PO TABS
25.0000 mg | ORAL_TABLET | Freq: Three times a day (TID) | ORAL | Status: DC | PRN
  Administered 2020-11-04: 25 mg via ORAL
  Filled 2020-11-03 (×2): qty 1

## 2020-11-03 MED ORDER — MAGNESIUM HYDROXIDE 400 MG/5ML PO SUSP: 30.0000 mL | Freq: Every day | ORAL | Status: DC | PRN

## 2020-11-03 MED ORDER — QUETIAPINE FUMARATE 50 MG PO TABS
50.0000 mg | ORAL_TABLET | Freq: Every day | ORAL | Status: DC
  Filled 2020-11-03 (×3): qty 1

## 2020-11-03 MED ORDER — ACETAMINOPHEN 325 MG PO TABS
650.0000 mg | ORAL_TABLET | Freq: Four times a day (QID) | ORAL | Status: DC | PRN
  Administered 2020-11-04 – 2020-11-06 (×3): 650 mg via ORAL
  Filled 2020-11-03 (×3): qty 2

## 2020-11-03 MED ORDER — SERTRALINE HCL 50 MG PO TABS: 50.0000 mg | ORAL_TABLET | Freq: Once | ORAL | 0 refills | Status: DC

## 2020-11-03 MED ORDER — ALUM & MAG HYDROXIDE-SIMETH 200-200-20 MG/5ML PO SUSP: 30.0000 mL | ORAL | Status: DC | PRN

## 2020-11-03 MED ORDER — QUETIAPINE FUMARATE 50 MG PO TABS: 50.0000 mg | ORAL_TABLET | Freq: Once | ORAL | 0 refills | Status: DC

## 2020-11-03 MED ORDER — HYDROXYZINE HCL 25 MG PO TABS: 25.0000 mg | ORAL_TABLET | Freq: Three times a day (TID) | ORAL | 0 refills | Status: DC | PRN

## 2020-11-03 MED ORDER — HYDROXYZINE HCL 25 MG PO TABS: 25.0000 mg | ORAL_TABLET | Freq: Three times a day (TID) | ORAL | Status: DC | PRN

## 2020-11-03 MED ORDER — SERTRALINE HCL 50 MG PO TABS
50.0000 mg | ORAL_TABLET | Freq: Every day | ORAL | Status: DC
  Administered 2020-11-04 – 2020-11-05 (×2): 50 mg via ORAL
  Filled 2020-11-03 (×6): qty 1

## 2020-11-03 MED ORDER — SERTRALINE HCL 50 MG PO TABS
50.0000 mg | ORAL_TABLET | Freq: Once | ORAL | Status: AC
  Administered 2020-11-03: 50 mg via ORAL
  Filled 2020-11-03: qty 1

## 2020-11-03 NOTE — ED Provider Notes (Signed)
Patient has been accepted to [facility] 301/2 after 8:00 PM
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
## 2020-11-03 NOTE — ED Notes (Signed)
Patient belongings in locker # 13. 

## 2020-11-03 NOTE — ED Notes (Signed)
Pt admitted to continuous assessment due to SI with plan to OD on medication. Pt unable to contract for safety at this time. Cooperative throughout assessment. Will monitor for safety.

## 2020-11-03 NOTE — ED Triage Notes (Signed)
29 yo female walk-in with complaints of feeling suicidal with a plan to OD on medication. Pt states, "I'm having really bad depression and I just don't want to be living no more. My dad passed away in 07-10-2023 and we were really close. I'm over it. I haven't been on my meds since he passed and things are just getting worse for me". Denies HI/AVH.

## 2020-11-03 NOTE — ED Notes (Signed)
Called report to [facility] the RN nurse whom will be taking report was passing medications. My number and name was given for her to call when report is ready to be given

## 2020-11-03 NOTE — ED Notes (Signed)
Given salad 

## 2020-11-03 NOTE — ED Notes (Signed)
Pt is sleeping@this time. Breathing even and unlabored. Will continue to monitor for safety 

## 2020-11-03 NOTE — ED Notes (Signed)
Report called to [employer], [facility] rm [phone], Pending Safe Transport.
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
## 2020-11-04 ENCOUNTER — Encounter (HOSPITAL_COMMUNITY): Payer: Self-pay

## 2020-11-04 ENCOUNTER — Inpatient Hospital Stay (HOSPITAL_COMMUNITY): Payer: Medicaid Other

## 2020-11-04 DIAGNOSIS — F332 Major depressive disorder, recurrent severe without psychotic features: Principal | ICD-10-CM

## 2020-11-04 MED ORDER — CEPHALEXIN 500 MG PO CAPS: 500.0000 mg | ORAL_CAPSULE | Freq: Two times a day (BID) | ORAL | 0 refills | Status: DC

## 2020-11-04 MED ORDER — DIVALPROEX SODIUM 250 MG PO DR TAB
250.0000 mg | DELAYED_RELEASE_TABLET | Freq: Two times a day (BID) | ORAL | Status: AC
  Administered 2020-11-04 – 2020-11-05 (×2): 250 mg via ORAL
  Filled 2020-11-04 (×4): qty 1

## 2020-11-04 MED ORDER — DIVALPROEX SODIUM 500 MG PO DR TAB
500.0000 mg | DELAYED_RELEASE_TABLET | Freq: Two times a day (BID) | ORAL | Status: DC
  Administered 2020-11-05 – 2020-11-08 (×6): 500 mg via ORAL
  Filled 2020-11-04 (×12): qty 1

## 2020-11-04 NOTE — ED Notes (Signed)
Pt has BH staff with her, [facility] stated they were 1 min away. Pt going back to [facility] with staff.  An After Visit Summary was printed and given to the patient. Discharge instructions given and no further questions at this time.

## 2020-11-04 NOTE — Progress Notes (Signed)
Progress note    11/04/20 0800  Psych Admission Type (Psych Patients Only)  Admission Status Voluntary  Psychosocial Assessment  Patient Complaints Anxiety;Depression;Sadness  Eye Contact Brief  Facial Expression Anxious;Sullen;Sad;Worried  Affect Anxious;Depressed;Sad;Sullen  Surveyor, minerals Activity Slow  Appearance/Hygiene Unremarkable  Behavior Characteristics Cooperative;Appropriate to situation;Anxious  Mood Depressed;Anxious;Sad;Sullen;Pleasant  Thought Administrator, sports thinking  Content WDL  Delusions None reported or observed  Perception WDL  Hallucination None reported or observed  Judgment Poor  Confusion None  Danger to Self  Current suicidal ideation? Denies  Danger to Others  Danger to Others None reported or observed

## 2020-11-04 NOTE — Plan of Care (Signed)
  Problem: Education: Goal: Knowledge of [facility] Education information/materials will improve Outcome: Progressing Goal: Emotional status will improve Outcome: Progressing Goal: Mental status will improve Outcome: Progressing Goal: Verbalization of understanding the information provided will improve Outcome: Progressing   

## 2020-11-04 NOTE — Discharge Instructions (Addendum)
I have prescribed antibiotics to treat your finger infection, please take 1 table twice a day for the next 7 days until medication has been completed.  If you experience any fever, worsening symptoms, worsening redness please return to the emergency department.

## 2020-11-04 NOTE — Tx Team (Signed)
Initial Treatment Plan 11/04/2020 12:15 AM Alexandra Gray CVE:[id number]    PATIENT STRESSORS: Educational concerns Loss of father passed 8/21 and miscarriage 9/21   PATIENT STRENGTHS: Average or above average intelligence Capable of independent living Communication skills Motivation for treatment/growth Supportive family/friends   PATIENT IDENTIFIED PROBLEMS: Suicidal ideation (OD on pills)  Depression  ("work on getting herself together and possibly getting back on medication")                 DISCHARGE CRITERIA:  Improved stabilization in mood, thinking, and/or behavior Verbal commitment to aftercare and medication compliance  PRELIMINARY DISCHARGE PLAN: Attend aftercare/continuing care group Attend PHP/IOP Outpatient therapy Return to previous living arrangement Return to previous work or school arrangements  PATIENT/FAMILY INVOLVEMENT: This treatment plan has been presented to and reviewed with the patient, Alexandra Gray, and/or family member.  The patient and family have been given the opportunity to ask questions and make suggestions.  Victorino December, RN 11/04/2020, 12:15 AM
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
## 2020-11-04 NOTE — ED Notes (Signed)
Per [facility]-states patient got hand caught in door-complaining of hand pain and swelling-here for xray

## 2020-11-04 NOTE — ED Triage Notes (Addendum)
Per [facility]- states pt got hand caught in door- c/o right hand pain
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
## 2020-11-04 NOTE — Progress Notes (Signed)
D: Patient presents with anxious and sad affect. Patient is pleasant and cooperative at time of assessment. Patient denies SI/HI at this time. Patient also denies AH/VH at this time. Patient contracts for safety.  A: Provided positive reinforcement and encouragement.  R: Patient cooperative and receptive to efforts. Patient remains safe on the unit.   11/04/20 2112  Psych Admission Type (Psych Patients Only)  Admission Status Voluntary  Psychosocial Assessment  Patient Complaints None  Eye Contact Brief  Facial Expression Anxious;Sad  Affect Sad;Sullen  Speech Logical/coherent  Interaction Assertive  Motor Activity Slow  Appearance/Hygiene Unremarkable  Behavior Characteristics Cooperative;Appropriate to situation  Mood Pleasant;Sad  Thought Process  Coherency WDL  Content WDL  Delusions None reported or observed  Perception WDL  Hallucination None reported or observed  Judgment Poor  Confusion None  Danger to Self  Current suicidal ideation? Denies  Self-Injurious Behavior No self-injurious ideation or behavior indicators observed or expressed   Agreement Not to Harm Self Yes  Description of Agreement Verbal Contract  Danger to Others  Danger to Others None reported or observed

## 2020-11-04 NOTE — Progress Notes (Signed)
   11/04/20 0013  Psych Admission Type (Psych Patients Only)  Admission Status Voluntary  Psychosocial Assessment  Patient Complaints Sadness;Depression;Self-harm thoughts  Eye Contact Avertive  Facial Expression Flat;Sad  Affect Sad;Flat;Depressed  Speech Logical/coherent  Interaction Assertive  Motor Activity Slow  Appearance/Hygiene Body odor  Behavior Characteristics Cooperative;Appropriate to situation  Mood Depressed;Sad  Thought Process  Coherency WDL  Content WDL  Delusions None reported or observed  Perception WDL  Hallucination None reported or observed  Judgment Impaired  Confusion None  Danger to Self  Current suicidal ideation? Passive  Self-Injurious Behavior No self-injurious ideation or behavior indicators observed or expressed   Agreement Not to Harm Self Yes  Description of Agreement verbal agreement to approach staff  Danger to Others  Danger to Others None reported or observed

## 2020-11-04 NOTE — ED Provider Notes (Signed)
[facility]-EMERGENCY DEPT Provider Note   CSN: [id number] Arrival date & time: 11/04/20  1640     History No chief complaint on file.   Alexandra Gray is a 29 y.o. female.  29 y.o female with a PMH of depression, currently receiving care at behavioral health urgent care.  She is here today as 2 days ago she slammed her left hand against the door, she has had pain to the area, she noted some swelling which has worsened since the accident, has not taken any medication for relieve in symptoms. Finger appears swollen with pus surrounding the nail bed. She denies any limited ROM, fever, or other complaints.   The history is provided by the patient.       Past Medical History:  Diagnosis Date  . Chlamydia   . Depression    doing ok now  . Gestational diabetes    Pt states she does not have DMII  . Gonorrhea   . History of pregnancy induced hypertension 12/06/2016  . Hx of trichomoniasis   . Hypertension    sometimes is up, never on meds  . Mental disorder   . Ovarian cyst   . PID (pelvic inflammatory disease)   . Sleep apnea     Patient Active Problem List   Diagnosis Date Noted  . H/O: C-section 01/04/2019  . Cesarean delivery delivered 01/04/2019  . GBS (group B Streptococcus carrier), +RV culture, currently pregnant 12/23/2018  . GDM, class A2 08/12/2018  . Chronic hypertension during pregnancy, antepartum 07/30/2018  . Obesity in pregnancy 06/27/2018  . BMI 50.0-59.9, adult (HCC) 05/28/2018  . History of cesarean delivery affecting pregnancy 10/03/2015  . Supervision of high risk pregnancy, antepartum 08/23/2015  . Severe major depression without psychotic features (HCC) 02/18/2014  . 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  . PTSD (post-traumatic stress disorder) 02/16/2014  . Sleep apnea 02/16/2014  . MDD (major depressive disorder), recurrent severe, 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    Past Surgical History:  Procedure  Laterality Date  . CESAREAN SECTION N/A 10/03/2015   Procedure: CESAREAN SECTION;  Surgeon: Tilda Burrow, MD;  Location: [facility];  Service: Obstetrics;  Laterality: N/A;  . CESAREAN SECTION N/A 01/04/2019   Procedure: CESAREAN SECTION;  Surgeon: Hermina Staggers, MD;  Location: [facility]ING SU[facility]ES;  Service: Obstetrics;  Laterality: N/A;  . KNEE SURGERY       OB History    Gravida  3   Para  2   Term  2   Preterm      AB      Living  2     SAB      IAB      Ectopic      Multiple  0   Live Births  2           Family History  Problem Relation Age of Onset  . Healthy Mother   . Healthy Father   . Healthy Brother   . Cancer Neg Hx   . Diabetes Neg Hx   . Heart disease Neg Hx   . Hypertension Neg Hx   . Stroke Neg Hx   . Hearing loss Neg Hx     Social History   Tobacco Use  . Smoking status: Never Smoker  . Smokeless tobacco: Never Used  Vaping Use  . Vaping Use: Never used  Substance Use Topics  . Alcohol use: No  . Drug use: No    Home Medications  Prior to Admission medications   Medication Sig Start Date End Date Taking? Authorizing Provider  hydrOXYzine (ATARAX/VISTARIL) 25 MG tablet Take 1 tablet (25 mg total) by mouth 3 (three) times daily as needed for anxiety. 11/03/20  Yes Rankin, Shuvon B, NP  QUEtiapine (SEROQUEL) 50 MG tablet Take 1 tablet (50 mg total) by mouth once for 1 dose. 11/03/20 11/03/20 Yes Rankin, Shuvon B, NP  sertraline (ZOLOFT) 50 MG tablet Take 1 tablet (50 mg total) by mouth once for 1 dose. 11/03/20 11/03/20 Yes Rankin, Shuvon B, NP  cephALEXin (KEFLEX) 500 MG capsule Take 1 capsule (500 mg total) by mouth 2 (two) times daily for 7 days. 11/04/20 11/11/20  Claude Manges, PA-C    Allergies    Infuvite adult [multiple vitamin] and Nubain [nalbuphine hcl]  Review of Systems   Review of Systems  Constitutional: Negative for fever.  Skin: Positive for color change.    Physical Exam Updated Vital Signs BP (!) 146/73 (BP  Location: Right Arm)   Pulse 84   Temp 98.7 F (37.1 C) (Oral)   Resp 18   Ht 5' (1.524 m)   Wt (!) 142.4 kg   LMP 10/24/2020 (Within Weeks)   SpO2 98%   Breastfeeding No   BMI 61.32 kg/m   Physical Exam Vitals and nursing note reviewed.  Constitutional:      Appearance: Normal appearance.  HENT:     Head: Normocephalic and atraumatic.     Mouth/Throat:     Mouth: Mucous membranes are moist.  Pulmonary:     Effort: Pulmonary effort is normal.  Abdominal:     General: Abdomen is flat.  Musculoskeletal:        General: Swelling and signs of injury present.     Cervical back: Normal range of motion and neck supple.     Comments: Swelling to left middle finger, pus contained.  Skin:    General: Skin is warm.     Findings: Erythema present.     Comments: Paronychia surrounding the nail bed noted. Swelling to the distal aspect of the middle left finger.    Neurological:     Mental Status: She is alert and oriented to person, place, and time.     ED Results / Procedures / Treatments   Labs (all labs ordered are listed, but only abnormal results are displayed) Labs Reviewed - No data to display  EKG None  Radiology No results found.  Procedures Drain paronychia  Date/Time: 11/04/2020 5:54 PM Performed by: Claude Manges, PA-C Authorized by: Claude Manges, PA-C  Consent: Verbal consent obtained. Consent given by: patient Patient identity confirmed: verbally with patient Local anesthesia used: no  Anesthesia: Local anesthesia used: no  Sedation: Patient sedated: no  Patient tolerance: patient tolerated the procedure well with no immediate complications Comments: Pus like drainage from left middle finger, tolerated procedure well.     (including critical care time)  Medications Ordered in ED Medications  acetaminophen (TYLENOL) tablet 650 mg (650 mg Oral Given 11/04/20 0750)  alum & mag hydroxide-simeth (MAALOX/MYLANTA) 200-200-20 MG/5ML suspension 30 mL  (has no administration in time range)  magnesium hydroxide (MILK OF MAGNESIA) suspension 30 mL (has no administration in time range)  hydrOXYzine (ATARAX/VISTARIL) tablet 25 mg (has no administration in time range)  sertraline (ZOLOFT) tablet 50 mg (50 mg Oral Given 11/04/20 0750)  divalproex (DEPAKOTE) DR tablet 250 mg (has no administration in time range)  divalproex (DEPAKOTE) DR tablet 500 mg (has no administration in time  range)    ED Course  I have reviewed the triage vital signs and the nursing notes.  Pertinent labs & imaging results that were available during my care of the patient were reviewed by me and considered in my medical decision making (see chart for details).    MDM Rules/Calculators/A&P   Patient from [facility] with left middle finger pain after slamming her hand with the door.  Patient reports finger began to swell swell 2 days ago, has gotten worse with erythema along with pus collection around the nailbed.  Signs consistent with a paronychia.  She is able to fully flex finger, has not had any fevers, no decrease in sensation, no limited range of motion.  We discussed treatment with I&D at the bedside, patient is agreeable of this at this time.  Patient had a bedside paronychia drained by me, tolerated the procedure without any complications, significant amount of drainage was obtained.  Vitals are within normal limits.  No prior history of MRSA, not immunocompromise, however due to green puslike drainage will provide patient with a short course of Keflex to prevent any further infection.  She is agreeable of twice daily therapy.  Patient stable for discharge at this time.  Portions of this note were generated with Scientist, clinical (histocompatibility and immunogenetics). Dictation errors may occur despite best attempts at proofreading.  Final Clinical Impression(s) / ED Diagnoses Final diagnoses:  Paronychia of left middle finger    Rx / DC Orders ED Discharge Orders         Ordered    cephALEXin  (KEFLEX) 500 MG capsule  2 times daily        11/04/20 1756           Claude Manges, PA-C 11/04/20 1757    Horton, Clabe Seal, DO 11/04/20 2340

## 2020-11-04 NOTE — Progress Notes (Signed)
[facility] Group Notes:  (Nursing/MHT/Case Management/Adjunct)  Date:  11/04/2020  Time:  2030  Type of Therapy:  wrap up group  Participation Level:  Active  Participation Quality:  Appropriate, Attentive, Sharing and Supportive  Affect:  Depressed  Cognitive:  Appropriate  Insight:  Improving  Engagement in Group:  Engaged  Modes of Intervention:  Clarification, Education and Support  Summary of Progress/Problems: Positive thinking and positive change were discussed.   Johann Capers S 11/04/2020, 9:01 PM

## 2020-11-05 MED ORDER — CEPHALEXIN 500 MG PO CAPS
500.0000 mg | ORAL_CAPSULE | Freq: Two times a day (BID) | ORAL | Status: DC
  Administered 2020-11-05: 500 mg via ORAL
  Filled 2020-11-05: qty 1
  Filled 2020-11-05: qty 2
  Filled 2020-11-05 (×4): qty 1

## 2020-11-05 MED ORDER — CEPHALEXIN 500 MG PO CAPS
500.0000 mg | ORAL_CAPSULE | Freq: Two times a day (BID) | ORAL | Status: DC
  Administered 2020-11-05 – 2020-11-08 (×6): 500 mg via ORAL
  Filled 2020-11-05 (×10): qty 1

## 2020-11-05 MED ORDER — SERTRALINE HCL 100 MG PO TABS
100.0000 mg | ORAL_TABLET | Freq: Every day | ORAL | Status: DC
  Administered 2020-11-06 – 2020-11-08 (×3): 100 mg via ORAL
  Filled 2020-11-05 (×5): qty 1

## 2020-11-05 MED ORDER — LORATADINE 10 MG PO TABS
10.0000 mg | ORAL_TABLET | Freq: Every day | ORAL | Status: DC
  Administered 2020-11-05 – 2020-11-08 (×4): 10 mg via ORAL
  Filled 2020-11-05 (×7): qty 1

## 2020-11-05 NOTE — Progress Notes (Addendum)
[facility] MD Progress Note  11/05/2020 3:10 PM Alexandra Gray  MRN:  [id number] Subjective:  Alexandra Gray reports that she did not sleep well last night due to sinus congestion. Patient reports she is tired and depressed today and her overall mood is "low energy." Patient reports that she is trying to stay busy on the unit by staying in the dayroom and interacting with others to help preoccupy her mind. Patient reports that if she is not busy she begins to have negative thoughts. Patient reports that she is still very upset about the death of her father and sometimes she thinks she is guilty in some way because of what her mom tells her; although usually she is able to remind herself that she is not guilty for his death. Patient reports that she has been told by others that "she should just get over it [father's death]" but she just is not able to. Otherwise patient reports no negative side effects. Patient reports that she has a decent appetite. Patient does contract for safety. Principal Problem: MDD (major depressive disorder), recurrent severe, without psychosis ([place]) Diagnosis: Principal Problem:   MDD (major depressive disorder), recurrent severe, without psychosis ([place]) Active Problems:   BMI 50.0-59.9, adult ([place])   Obesity in pregnancy   Cluster B personality disorder ([place])   PTSD (post-traumatic stress disorder)  Total Time spent with patient: 20 minutes  Past Psychiatric History: See H&P  Past Medical History:  Past Medical History:  Diagnosis Date  . Chlamydia   . Depression    doing ok now  . Gestational diabetes    Pt states she does not have DMII  . Gonorrhea   . History of pregnancy induced hypertension 12/06/2016  . Hx of trichomoniasis   . Hypertension    sometimes is up, never on meds  . Mental disorder   . Ovarian cyst   . PID (pelvic inflammatory disease)   . Sleep apnea     Past Surgical History:  Procedure Laterality Date  . CESAREAN SECTION N/A 10/03/2015    Procedure: CESAREAN SECTION;  Surgeon: Jonnie Kind, MD;  Location: [facility];  Service: Obstetrics;  Laterality: N/A;  . CESAREAN SECTION N/A 01/04/2019   Procedure: CESAREAN SECTION;  Surgeon: Chancy Milroy, MD;  Location: [facility];  Service: Obstetrics;  Laterality: N/A;  . KNEE SURGERY     Family History:  Family History  Problem Relation Age of Onset  . Healthy Mother   . Healthy Father   . Healthy Brother   . Cancer Neg Hx   . Diabetes Neg Hx   . Heart disease Neg Hx   . Hypertension Neg Hx   . Stroke Neg Hx   . Hearing loss Neg Hx    Family Psychiatric  History: See H&P Social History:  Social History   Substance and Sexual Activity  Alcohol Use No     Social History   Substance and Sexual Activity  Drug Use No    Social History   Socioeconomic History  . Marital status: Single    Spouse name: Not on file  . Number of children: 2  . Years of education: 9  . Highest education level: Not on file  Occupational History  . Occupation: disabled  Tobacco Use  . Smoking status: Never Smoker  . Smokeless tobacco: Never Used  Vaping Use  . Vaping Use: Never used  Substance and Sexual Activity  . Alcohol use: No  . Drug use: No  .  Sexual activity: Not Currently    Birth control/protection: None  Other Topics Concern  . Not on file  Social History Narrative   Lives alone in a one story home.  Has one child.     On disability for depression and PTSD.     Education: 9th grade.    She is in school for criminal justice.   Social Determinants of Health   Financial Resource Strain: Not on file  Food Insecurity: Not on file  Transportation Needs: Not on file  Physical Activity: Not on file  Stress: Not on file  Social Connections: Not on file   Additional Social History:                         Sleep: Poor  Appetite:  Good  Current Medications: Current Facility-Administered Medications  Medication Dose Route Frequency Provider  Last Rate Last Admin  . acetaminophen (TYLENOL) tablet 650 mg  650 mg Oral Q6H PRN Rankin, Shuvon B, NP   650 mg at 11/05/20 0807  . alum & mag hydroxide-simeth (MAALOX/MYLANTA) 200-200-20 MG/5ML suspension 30 mL  30 mL Oral Q4H PRN Rankin, Shuvon B, NP      . cephALEXin (KEFLEX) capsule 500 mg  500 mg Oral Q12H Kolbie Clarkston B, MD   500 mg at 11/05/20 0805  . divalproex (DEPAKOTE) DR tablet 500 mg  500 mg Oral Q12H Sandford Diop B, MD      . hydrOXYzine (ATARAX/VISTARIL) tablet 25 mg  25 mg Oral TID PRN Rankin, Shuvon B, NP   25 mg at 11/04/20 2112  . loratadine (CLARITIN) tablet 10 mg  10 mg Oral Daily Damita Dunnings B, MD      . magnesium hydroxide (MILK OF MAGNESIA) suspension 30 mL  30 mL Oral Daily PRN Rankin, Shuvon B, NP      . sertraline (ZOLOFT) tablet 50 mg  50 mg Oral Daily Rankin, Shuvon B, NP   50 mg at 11/05/20 0805    Lab Results:  Results for orders placed or performed during the hospital encounter of 11/03/20 (from the past 48 hour(s))  Resp Panel by RT-PCR (Flu A&B, Covid) Nasopharyngeal Swab     Status: None   Collection Time: 11/03/20  4:04 PM   Specimen: Nasopharyngeal Swab; Nasopharyngeal(NP) swabs in vial transport medium  Result Value Ref Range   SARS Coronavirus 2 by RT PCR NEGATIVE NEGATIVE    Comment: (NOTE) SARS-CoV-2 target nucleic acids are NOT DETECTED.  The SARS-CoV-2 RNA is generally detectable in upper respiratory specimens during the acute phase of infection. The lowest concentration of SARS-CoV-2 viral copies this assay can detect is 138 copies/mL. A negative result does not preclude SARS-Cov-2 infection and should not be used as the sole basis for treatment or other patient management decisions. A negative result may occur with  improper specimen collection/handling, submission of specimen other than nasopharyngeal swab, presence of viral mutation(s) within the areas targeted by this assay, and inadequate number of viral copies(<138 copies/mL). A  negative result must be combined with clinical observations, patient history, and epidemiological information. The expected result is Negative.  Fact Sheet for Patients:  EntrepreneurPulse.com.au  Fact Sheet for Healthcare Providers:  IncredibleEmployment.be  This test is no t yet approved or cleared by the [place] FDA and  has been authorized for detection and/or diagnosis of SARS-CoV-2 by FDA under an Emergency Use Authorization (EUA). This EUA will remain  in effect (meaning this test can be used)  for the duration of the COVID-19 declaration under Section 564(b)(1) of the Act, 21 U.S.C.section 360bbb-3(b)(1), unless the authorization is terminated  or revoked sooner.       Influenza A by PCR NEGATIVE NEGATIVE   Influenza B by PCR NEGATIVE NEGATIVE    Comment: (NOTE) The Xpert Xpress SARS-CoV-2/FLU/RSV plus assay is intended as an aid in the diagnosis of influenza from Nasopharyngeal swab specimens and should not be used as a sole basis for treatment. Nasal washings and aspirates are unacceptable for Xpert Xpress SARS-CoV-2/FLU/RSV testing.  Fact Sheet for Patients: EntrepreneurPulse.com.au  Fact Sheet for Healthcare Providers: IncredibleEmployment.be  This test is not yet approved or cleared by the [place] FDA and has been authorized for detection and/or diagnosis of SARS-CoV-2 by FDA under an Emergency Use Authorization (EUA). This EUA will remain in effect (meaning this test can be used) for the duration of the COVID-19 declaration under Section 564(b)(1) of the Act, 21 U.S.C. section 360bbb-3(b)(1), unless the authorization is terminated or revoked.  Performed at [facility], [place] [address], [place], [place] [address]   Urinalysis, Routine w reflex microscopic Urine, Clean Catch     Status: Abnormal   Collection Time: 11/03/20  4:05 PM  Result Value Ref Range   Color, Urine  YELLOW YELLOW   APPearance HAZY (A) CLEAR   Specific Gravity, Urine 1.023 1.005 - 1.030   pH 6.0 5.0 - 8.0   Glucose, UA NEGATIVE NEGATIVE mg/dL   Hgb urine dipstick NEGATIVE NEGATIVE   Bilirubin Urine NEGATIVE NEGATIVE   Ketones, ur NEGATIVE NEGATIVE mg/dL   Protein, ur 30 (A) NEGATIVE mg/dL   Nitrite POSITIVE (A) NEGATIVE   Leukocytes,Ua NEGATIVE NEGATIVE   RBC / HPF 0-5 0 - 5 RBC/hpf   WBC, UA 0-5 0 - 5 WBC/hpf   Bacteria, UA NONE SEEN NONE SEEN   Squamous Epithelial / LPF 0-5 0 - 5   Mucus PRESENT     Comment: Performed at [facility], [place] [address], [place], [place] [address]  Hemoglobin A1c     Status: None   Collection Time: 11/03/20  4:09 PM  Result Value Ref Range   Hgb A1c MFr Bld 5.3 4.8 - 5.6 %    Comment: (NOTE) Pre diabetes:          5.7%-6.4%  Diabetes:              >6.4%  Glycemic control for   <7.0% adults with diabetes    Mean Plasma Glucose 105.41 mg/dL    Comment: Performed at [facility] [address] [place], [place] [address]  Ethanol     Status: None   Collection Time: 11/03/20  4:09 PM  Result Value Ref Range   Alcohol, Ethyl (B) <10 <10 mg/dL    Comment: (NOTE) Lowest detectable limit for serum alcohol is 10 mg/dL.  For medical purposes only. Performed at [facility], [place] [address], [place], [place] [address]   Lipid panel     Status: None   Collection Time: 11/03/20  4:09 PM  Result Value Ref Range   Cholesterol 166 0 - 200 mg/dL   Triglycerides 144 <150 mg/dL   HDL 42 >40 mg/dL   Total CHOL/HDL Ratio 4.0 RATIO   VLDL 29 0 - 40 mg/dL   LDL Cholesterol 95 0 - 99 mg/dL    Comment:        Total Cholesterol/HDL:CHD Risk Coronary Heart Disease Risk Table  Men   Women  1/2 Average Risk   3.4   3.3  Average Risk       5.0   4.4  2 X Average Risk   9.6   7.1  3 X Average Risk  23.4   11.0        Use the calculated Patient Ratio above and the CHD Risk Table to determine the patient's CHD  Risk.        ATP III CLASSIFICATION (LDL):  <100     mg/dL   Optimal  100-129  mg/dL   Near or Above                    Optimal  130-159  mg/dL   Borderline  160-189  mg/dL   High  >190     mg/dL   Very High Performed at [facility] [address], [place], [place] [address]   TSH     Status: None   Collection Time: 11/03/20  4:09 PM  Result Value Ref Range   TSH 2.250 0.350 - 4.500 uIU/mL    Comment: Performed by a 3rd Generation assay with a functional sensitivity of <=0.01 uIU/mL. Performed at [facility], [place] [address] [place], [place] [address]   CBC with Differential/Platelet     Status: Abnormal   Collection Time: 11/03/20  4:10 PM  Result Value Ref Range   WBC 11.6 (H) 4.0 - 10.5 K/uL   RBC 5.59 (H) 3.87 - 5.11 MIL/uL   Hemoglobin 14.7 12.0 - 15.0 g/dL   HCT 46.9 (H) 36.0 - 46.0 %   MCV 83.9 80.0 - 100.0 fL   MCH 26.3 26.0 - 34.0 pg   MCHC 31.3 30.0 - 36.0 g/dL   RDW 14.2 11.5 - 15.5 %   Platelets 353 150 - 400 K/uL   nRBC 0.0 0.0 - 0.2 %   Neutrophils Relative % 64 %   Neutro Abs 7.5 1.7 - 7.7 K/uL   Lymphocytes Relative 25 %   Lymphs Abs 2.9 0.7 - 4.0 K/uL   Monocytes Relative 6 %   Monocytes Absolute 0.7 0.1 - 1.0 K/uL   Eosinophils Relative 4 %   Eosinophils Absolute 0.4 0.0 - 0.5 K/uL   Basophils Relative 0 %   Basophils Absolute 0.0 0.0 - 0.1 K/uL   Immature Granulocytes 1 %   Abs Immature Granulocytes 0.08 (H) 0.00 - 0.07 K/uL    Comment: Performed at [facility] [address] [place], [place] [address]  Comprehensive metabolic panel     Status: None   Collection Time: 11/03/20  4:10 PM  Result Value Ref Range   Sodium 138 135 - 145 mmol/L   Potassium 3.7 3.5 - 5.1 mmol/L   Chloride 102 98 - 111 mmol/L   CO2 23 22 - 32 mmol/L   Glucose, Bld 90 70 - 99 mg/dL    Comment: Glucose reference range applies only to samples taken after fasting for at least 8 hours.   BUN 9 6 - 20 mg/dL   Creatinine, Ser 0.69 0.44 - 1.00 mg/dL    Calcium 9.2 8.9 - 10.3 mg/dL   Total Protein 7.8 6.5 - 8.1 g/dL   Albumin 3.6 3.5 - 5.0 g/dL   AST 19 15 - 41 U/L   ALT 22 0 - 44 U/L   Alkaline Phosphatase 91 38 - 126 U/L   Total Bilirubin 0.5 0.3 - 1.2 mg/dL  GFR, Estimated >60 >60 mL/min    Comment: (NOTE) Calculated using the CKD-EPI Creatinine Equation (2021)    Anion gap 13 5 - 15    Comment: Performed at [facility], [place] [address], [place], [place] [address]  Magnesium     Status: None   Collection Time: 11/03/20  4:10 PM  Result Value Ref Range   Magnesium 2.3 1.7 - 2.4 mg/dL    Comment: Performed at [facility] [address], [place], [place] [address]  POCT Urine Drug Screen - (ICup)     Status: Normal   Collection Time: 11/03/20  4:11 PM  Result Value Ref Range   POC Amphetamine UR None Detected None Detected   POC Secobarbital (BAR) None Detected None Detected   POC Buprenorphine (BUP) None Detected None Detected   POC Oxazepam (BZO) None Detected None Detected   POC Cocaine UR None Detected None Detected   POC Methamphetamine UR None Detected None Detected   POC Morphine None Detected None Detected   POC Oxycodone UR None Detected None Detected   POC Methadone UR None Detected None Detected   POC Marijuana UR None Detected None Detected  POC SARS Coronavirus 2 Ag     Status: None   Collection Time: 11/03/20  4:14 PM  Result Value Ref Range   SARS Coronavirus 2 Ag NEGATIVE NEGATIVE    Comment: (NOTE) SARS-CoV-2 antigen NOT DETECTED.   Negative results are presumptive.  Negative results do not preclude SARS-CoV-2 infection and should not be used as the sole basis for treatment or other patient management decisions, including infection  control decisions, particularly in the presence of clinical signs and  symptoms consistent with COVID-19, or in those who have been in contact with the virus.  Negative results must be combined with clinical observations, patient history, and  epidemiological information. The expected result is Negative.  Fact Sheet for Patients: PodPark.tn  Fact Sheet for Healthcare Providers: GiftContent.is   This test is not yet approved or cleared by the [place] FDA and  has been authorized for detection and/or diagnosis of SARS-CoV-2 by FDA under an Emergency Use Authorization (EUA).  This EUA will remain in effect (meaning this test can be used) for the duration of  the C OVID-19 declaration under Section 564(b)(1) of the Act, 21 U.S.C. section 360bbb-3(b)(1), unless the authorization is terminated or revoked sooner.    Pregnancy, urine POC     Status: None   Collection Time: 11/03/20  4:14 PM  Result Value Ref Range   Preg Test, Ur NEGATIVE NEGATIVE    Comment:        THE SENSITIVITY OF THIS METHODOLOGY IS >24 mIU/mL   POC SARS Coronavirus 2 Ag-ED - Nasal Swab (BD Veritor Kit)     Status: Normal   Collection Time: 11/03/20  4:17 PM  Result Value Ref Range   SARS Coronavirus 2 Ag Negative Negative    Blood Alcohol level:  Lab Results  Component Value Date   ETH <10 11/03/2020   ETH <11 71/21/9758    Metabolic Disorder Labs: Lab Results  Component Value Date   HGBA1C 5.3 11/03/2020   MPG 105.41 11/03/2020   MPG 105 10/30/2010   No results found for: PROLACTIN Lab Results  Component Value Date   CHOL 166 11/03/2020   TRIG 144 11/03/2020   HDL 42 11/03/2020   CHOLHDL 4.0 11/03/2020   VLDL 29 11/03/2020   LDLCALC 95 11/03/2020   LDLCALC (H)  10/30/2010    101        Total Cholesterol/HDL:CHD Risk Coronary Heart Disease Risk Table                     Men   Women  1/2 Average Risk   3.4   3.3  Average Risk       5.0   4.4  2 X Average Risk   9.6   7.1  3 X Average Risk  23.4   11.0        Use the calculated Patient Ratio above and the CHD Risk Table to determine the patient's CHD Risk.        ATP III CLASSIFICATION (LDL):  <100     mg/dL    Optimal  100-129  mg/dL   Near or Above                    Optimal  130-159  mg/dL   Borderline  160-189  mg/dL   High  >190     mg/dL   Very High    Physical Findings: AIMS: Facial and Oral Movements Muscles of Facial Expression: None, normal Lips and Perioral Area: None, normal Jaw: None, normal Tongue: None, normal,Extremity Movements Upper (arms, wrists, hands, fingers): None, normal Lower (legs, knees, ankles, toes): None, normal, Trunk Movements Neck, shoulders, hips: None, normal, Overall Severity Severity of abnormal movements (highest score from questions above): None, normal Incapacitation due to abnormal movements: None, normal Patient's awareness of abnormal movements (rate only patient's report): No Awareness, Dental Status Current problems with teeth and/or dentures?: No Does patient usually wear dentures?: No  CIWA:    COWS:     Musculoskeletal: Strength & Muscle Tone: within normal limits Gait & Station: normal Patient leans: N/A  Psychiatric Specialty Exam: Physical Exam Constitutional:      Appearance: Normal appearance.  HENT:     Head: Normocephalic and atraumatic.  Pulmonary:     Effort: Pulmonary effort is normal.  Neurological:     Mental Status: She is alert.     Review of Systems  Cardiovascular: Negative for chest pain.  Gastrointestinal: Negative for abdominal pain.  Neurological: Negative for headaches.    Blood pressure (!) 143/90, pulse 80, temperature 97.6 F (36.4 C), temperature source Oral, resp. rate 18, height 5' (1.524 m), weight (!) 142.4 kg, last menstrual period 10/24/2020, SpO2 100 %, not currently breastfeeding.Body mass index is 61.32 kg/m.  General Appearance: Casual  Eye Contact:  Fair  Speech:  Clear and Coherent  Volume:  Decreased  Mood:  Depressed  Affect:  Depressed  Thought Process:  Coherent  Orientation:  NA  Thought Content:  Logical  Suicidal Thoughts:  No  Homicidal Thoughts:  No  Memory:  Immediate;    Fair  Judgement:  Fair  Insight:  Shallow  Psychomotor Activity:  Decreased  Concentration:  Concentration: Good  Recall:  NA  Fund of Knowledge:  Good  Language:  Good  Akathisia:  No  Handed:  Right  AIMS (if indicated):     Assets:  Communication Skills Desire for Improvement Housing Leisure Time  ADL's:  Intact  Cognition:  WNL  Sleep:  Number of Hours: 5.25     Treatment Plan Summary: Daily contact with patient to assess and evaluate symptoms and progress in treatment  Alexandra Gray is a 29yo patient with a hx of MDD, anxiety and mood instability. Patient continues to reports depressed mood although at this  most of it is associated with grieving the death of her father. During interview it was explained to patient that grief is normal and that she should expect to be sad and everyone's grieving periods are different. It was explained to patient that what is considered unhealthy is when the grieving periods starts making her unable to function in her daily life. Patient was reporting this is what brought her to the [facility] at admission. The patient continues to endorse low energy and concentration thus her depression is still interfering with her daily activities despite making the effort to create her own patterns. Will continue with medication management and increased patient Zoloft and Depakote. MDD, recurrent, moderate, w/o psychosis - Zoloft 144m - 500 mg BID Depakote  Finger Injury Patient was evaluated in ED and concluded to have pus in the injury site. The pus was drained and appeared to be infected. Patient was started on Keflex - Keflex 5050mBID   Inflammation of the Sinuses Patient reports that she is having copious drainage and also feeling "stopped up" due to allergies. Patient reports that she has a hx of Claritin. Patient has been afebrile and does not have any other symptoms of illness. Will continue to monitor. - Claritin 1059maily PGY-1 JaiFreida BusmanMD 11/05/2020, 3:10 PM

## 2020-11-05 NOTE — Tx Team (Addendum)
Interdisciplinary Treatment and Diagnostic Plan Update  11/05/2020 Time of Session: 9:00am  Alexandra Gray MRN: [id number]  Principal Diagnosis: MDD (major depressive disorder), recurrent severe, without psychosis (HCC)  Secondary Diagnoses: Principal Problem:   MDD (major depressive disorder), recurrent severe, without psychosis (HCC) Active Problems:   BMI 50.0-59.9, adult (HCC)   Obesity in pregnancy   Cluster B personality disorder (HCC)   PTSD (post-traumatic stress disorder)   Current Medications:  Current Facility-Administered Medications  Medication Dose Route Frequency Provider Last Rate Last Admin  . acetaminophen (TYLENOL) tablet 650 mg  650 mg Oral Q6H PRN Rankin, Shuvon B, NP   650 mg at 11/05/20 0807  . alum & mag hydroxide-simeth (MAALOX/MYLANTA) 200-200-20 MG/5ML suspension 30 mL  30 mL Oral Q4H PRN Rankin, Shuvon B, NP      . cephALEXin (KEFLEX) capsule 500 mg  500 mg Oral Q12H McQuilla, Jai B, MD   500 mg at 11/05/20 0805  . divalproex (DEPAKOTE) DR tablet 500 mg  500 mg Oral Q12H McQuilla, Jai B, MD      . hydrOXYzine (ATARAX/VISTARIL) tablet 25 mg  25 mg Oral TID PRN Rankin, Shuvon B, NP   25 mg at 11/04/20 2112  . magnesium hydroxide (MILK OF MAGNESIA) suspension 30 mL  30 mL Oral Daily PRN Rankin, Shuvon B, NP      . sertraline (ZOLOFT) tablet 50 mg  50 mg Oral Daily Rankin, Shuvon B, NP   50 mg at 11/05/20 0805   PTA Medications: Medications Prior to Admission  Medication Sig Dispense Refill Last Dose  . hydrOXYzine (ATARAX/VISTARIL) 25 MG tablet Take 1 tablet (25 mg total) by mouth 3 (three) times daily as needed for anxiety. 30 tablet 0   . QUEtiapine (SEROQUEL) 50 MG tablet Take 1 tablet (50 mg total) by mouth once for 1 dose. 1 tablet 0   . sertraline (ZOLOFT) 50 MG tablet Take 1 tablet (50 mg total) by mouth once for 1 dose. 1 tablet 0     Patient Stressors: Educational concerns Loss of father passed 8/21 and miscarriage 9/21  Patient Strengths:  Average or above average intelligence Capable of independent living Communication skills Motivation for treatment/growth Supportive family/friends  Treatment Modalities: Medication Management, Group therapy, Case management,  1 to 1 session with clinician, Psychoeducation, Recreational therapy.   Physician Treatment Plan for Primary Diagnosis: MDD (major depressive disorder), recurrent severe, without psychosis (HCC) Long Term Goal(s): Improvement in symptoms so as ready for discharge Improvement in symptoms so as ready for discharge   Short Term Goals: Ability to identify changes in lifestyle to reduce recurrence of condition will improve Ability to verbalize feelings will improve Ability to disclose and discuss suicidal ideas Ability to demonstrate self-control will improve Compliance with prescribed medications will improve Ability to identify triggers associated with substance abuse/mental health issues will improve Ability to disclose and discuss suicidal ideas Ability to demonstrate self-control will improve Ability to maintain clinical measurements within normal limits will improve Compliance with prescribed medications will improve  Medication Management: Evaluate patient's response, side effects, and tolerance of medication regimen.  Therapeutic Interventions: 1 to 1 sessions, Unit Group sessions and Medication administration.  Evaluation of Outcomes: Progressing  Physician Treatment Plan for Secondary Diagnosis: Principal Problem:   MDD (major depressive disorder), recurrent severe, without psychosis (HCC) Active Problems:   BMI 50.0-59.9, adult (HCC)   Obesity in pregnancy   Cluster B personality disorder (HCC)   PTSD (post-traumatic stress disorder)  Long Term Goal(s): Improvement in  symptoms so as ready for discharge Improvement in symptoms so as ready for discharge   Short Term Goals: Ability to identify changes in lifestyle to reduce recurrence of condition  will improve Ability to verbalize feelings will improve Ability to disclose and discuss suicidal ideas Ability to demonstrate self-control will improve Compliance with prescribed medications will improve Ability to identify triggers associated with substance abuse/mental health issues will improve Ability to disclose and discuss suicidal ideas Ability to demonstrate self-control will improve Ability to maintain clinical measurements within normal limits will improve Compliance with prescribed medications will improve     Medication Management: Evaluate patient's response, side effects, and tolerance of medication regimen.  Therapeutic Interventions: 1 to 1 sessions, Unit Group sessions and Medication administration.  Evaluation of Outcomes: Progressing   RN Treatment Plan for Primary Diagnosis: MDD (major depressive disorder), recurrent severe, without psychosis (HCC) Long Term Goal(s): Knowledge of disease and therapeutic regimen to maintain health will improve  Short Term Goals: Ability to remain free from injury will improve, Ability to participate in decision making will improve, Ability to verbalize feelings will improve, Ability to disclose and discuss suicidal ideas and Ability to identify and develop effective coping behaviors will improve  Medication Management: RN will administer medications as ordered by provider, will assess and evaluate patient's response and provide education to patient for prescribed medication. RN will report any adverse and/or side effects to prescribing provider.  Therapeutic Interventions: 1 on 1 counseling sessions, Psychoeducation, Medication administration, Evaluate responses to treatment, Monitor vital signs and CBGs as ordered, Perform/monitor CIWA, COWS, AIMS and Fall Risk screenings as ordered, Perform wound care treatments as ordered.  Evaluation of Outcomes: Progressing   LCSW Treatment Plan for Primary Diagnosis: MDD (major depressive  disorder), recurrent severe, without psychosis (HCC) Long Term Goal(s): Safe transition to appropriate next level of care at discharge, Engage patient in therapeutic group addressing interpersonal concerns.  Short Term Goals: Engage patient in aftercare planning with referrals and resources, Increase social support, Increase emotional regulation, Facilitate acceptance of mental health diagnosis and concerns, Identify triggers associated with mental health/substance abuse issues and Increase skills for wellness and recovery  Therapeutic Interventions: Assess for all discharge needs, 1 to 1 time with Social worker, Explore available resources and support systems, Assess for adequacy in community support network, Educate family and significant other(s) on suicide prevention, Complete Psychosocial Assessment, Interpersonal group therapy.  Evaluation of Outcomes: Progressing   Progress in Treatment: Attending groups: Yes. Participating in groups: Yes. Taking medication as prescribed: Yes. Toleration medication: Yes. Family/Significant other contact made: Yes, individual(s) contacted:  God-Mother Patient understands diagnosis: Yes. Discussing patient identified problems/goals with staff: Yes. Medical problems stabilized or resolved: Yes. Denies suicidal/homicidal ideation: Yes. Issues/concerns per patient self-inventory: No.   New problem(s) identified: No, Describe:  None   New Short Term/Long Term Goal(s): medication stabilization, elimination of SI thoughts, development of comprehensive mental wellness plan.   Patient Goals:  "To work on my depression"   Discharge Plan or Barriers: Patient recently admitted. CSW will continue to follow and assess for appropriate referrals and possible discharge planning.   Reason for Continuation of Hospitalization: Depression Medication stabilization Suicidal ideation  Estimated Length of Stay: 3 to 5 days   Attendees: Patient: Alexandra Gray  11/05/2020   Physician: Pricilla Larsson, MD 11/05/2020   Nursing:  11/05/2020   RN Care Manager: 11/05/2020   Social Worker: Melba Coon, LCSWA 11/05/2020   Recreational Therapist:  11/05/2020   Other:  11/05/2020   Other:  11/05/2020   Other: 11/05/2020     Scribe for Treatment Team: Aram Beecham, LCSWA 11/05/2020 9:51 AM

## 2020-11-05 NOTE — Progress Notes (Addendum)
   11/05/20 1100  Psych Admission Type (Psych Patients Only)  Admission Status Voluntary  Psychosocial Assessment  Patient Complaints Anxiety  Eye Contact Brief  Facial Expression Anxious;Sad  Affect Sad;Sullen  Speech Logical/coherent  Interaction Assertive  Motor Activity Slow  Appearance/Hygiene Unremarkable  Behavior Characteristics Cooperative;Appropriate to situation;Anxious  Mood Anxious;Pleasant  Thought Process  Coherency WDL  Content WDL  Delusions None reported or observed  Perception WDL  Hallucination None reported or observed  Judgment Poor  Confusion None  Danger to Self  Current suicidal ideation? Denies  Self-Injurious Behavior No self-injurious ideation or behavior indicators observed or expressed   Agreement Not to Harm Self Yes  Description of Agreement Verbal Contract  Danger to Others  Danger to Others None reported or observed    D. Pt presents with a depressed affect/ mood- per pt's self inventory, pt rated her depression, hopelessness and anxiety all 10's this am. Pt has been visible in the milieu interacting well with peers and attending groups. Pt endorsed passive SI this am (verbally contracting for safety) and reported that she keeps herself busy to stay positive.. A. Labs and vitals monitored. Pt given and educated on medications. Pt supported emotionally and encouraged to express concerns and ask questions.   R. Pt remains safe with 15 minute checks. Will continue POC.

## 2020-11-05 NOTE — [facility] Group Notes (Signed)
[facility] LCSW Group Therapy  11/05/2020 1:56 PM  Type of Therapy:  Group Therapy: Connecting With Your Peers: As an Research scientist (life sciences), pts were asked to name one thing you are happy about today. Pts then drew cards from the "ungame" game and answered the questions on the cards. As a closer, pts were asked if they could go anywhere in the world and who they would take. The purpose of this exercise was to learn about others and see your connections.   Participation Level:  Active  Summary of Progress/Problems: Pt shared that today she was happy about getting herself together. Pt shared that she would take her mother to [place] so they could bond like they used to.   Alexandra Gray 11/05/2020, 1:56 PM

## 2020-11-05 NOTE — [facility] Suicide Risk Assessment (Signed)
[facility] INPATIENT:  Family/Significant Other Suicide Prevention Education  Suicide Prevention Education:  Education Completed; Alexandra Gray (God-mother) [phone],  (name of family member/significant other) has been identified by the patient as the family member/significant other with whom the patient will be residing, and identified as the person(s) who will aid the patient in the event of a mental health crisis (suicidal ideations/suicide attempt).  With written consent from the patient, the family member/significant other has been provided the following suicide prevention education, prior to the and/or following the discharge of the patient.  The suicide prevention education provided includes the following:  Suicide risk factors  Suicide prevention and interventions  National Suicide Hotline telephone number  [facility] assessment telephone number  [facility] Emergency Assistance 911  [facility] and/or Residential Mobile Crisis Unit telephone number  Request made of family/significant other to:  Remove weapons (e.g., guns, rifles, knives), all items previously/currently identified as safety concern.    Remove drugs/medications (over-the-counter, prescriptions, illicit drugs), all items previously/currently identified as a safety concern.  The family member/significant other verbalizes understanding of the suicide prevention education information provided.  The family member/significant other agrees to remove the items of safety concern listed above.  God mother stated "I have no idea what happened. She is currently living with me but she goes out and does things she shouldn't do. It could have been drugs. I don't know." when asked about what led the pt to the hospital. Caller reports no safety concerns or weapons in the home.     Alexandra Gray 11/05/2020, 9:18 AM

## 2020-11-05 NOTE — Progress Notes (Signed)
Recreation Therapy Notes  Date:  12.10.21 Time: 0930 Location: [address] Dayroom  Group Topic: Stress Management  Goal Area(s) Addresses:  Patient will identify positive stress management techniques. Patient will identify benefits of using stress management post d/c.  Behavioral Response: Engaged  Intervention: Stress Management  Activity : Guided Imagery.  LRT read a script that took patients on a journey to the beach to listen to the peaceful waves.  Patients were to listen and follow along as LRT read script to engage in activity.  Education:  Stress Management, Discharge Planning.   Education Outcome: Acknowledges Education  Clinical Observations/Feedback: Pt attended and participated in activity.    Caroll Rancher, LRT/CTRS        Caroll Rancher A 11/05/2020 11:42 AM

## 2020-11-05 NOTE — Progress Notes (Signed)
[place] NOVEL CORONAVIRUS (COVID-19) DAILY CHECK-OFF SYMPTOMS - answer yes or no to each - every day NO YES  Have you had a fever in the past 24 hours?  . Fever (Temp > 37.80C / 100F) X   Have you had any of these symptoms in the past 24 hours? . New Cough .  Sore Throat  .  Shortness of Breath .  Difficulty Breathing .  Unexplained Body Aches   X   Have you had any one of these symptoms in the past 24 hours not related to allergies?   . Runny Nose .  Nasal Congestion .  Sneezing   X   If you have had runny nose, nasal congestion, sneezing in the past 24 hours, has it worsened?  X   EXPOSURES - check yes or no X   Have you traveled outside the state in the past 14 days?  X   Have you been in contact with someone with a confirmed diagnosis of COVID-19 or PUI in the past 14 days without wearing appropriate PPE?  X   Have you been living in the same home as a person with confirmed diagnosis of COVID-19 or a PUI (household contact)?    X   Have you been diagnosed with COVID-19?    X              What to do next: Answered NO to all: Answered YES to anything:   Proceed with unit schedule Follow the [facility] Inpatient Flowsheet.   

## 2020-11-06 MED ORDER — FLUTICASONE PROPIONATE 50 MCG/ACT NA SUSP
1.0000 | Freq: Every day | NASAL | Status: DC
  Administered 2020-11-06 – 2020-11-08 (×3): 1 via NASAL
  Filled 2020-11-06 (×2): qty 16

## 2020-11-06 MED ORDER — MENTHOL 3 MG MT LOZG: 1.0000 | LOZENGE | OROMUCOSAL | Status: DC | PRN

## 2020-11-06 NOTE — [facility] Group Notes (Signed)
LCSW Group Therapy Note  11/06/2020   10:00-11:00am   Type of Therapy and Topic:  Group Therapy: Anger Cues and Responses  Participation Level:  Active   Description of Group:   In this group, patients learned how to recognize the physical, cognitive, emotional, and behavioral responses they have to anger-provoking situations.  They identified a recent time they became angry and how they reacted.  They analyzed how their reaction was possibly beneficial and how it was possibly unhelpful.  The group discussed a variety of healthier coping skills that could help with such a situation in the future.  Focus was placed on how helpful it is to recognize the underlying emotions to our anger, because working on those can lead to a more permanent solution as well as our ability to focus on the important rather than the urgent.  Therapeutic Goals: 1. Patients will remember their last incident of anger and how they felt emotionally and physically, what their thoughts were at the time, and how they behaved. 2. Patients will identify how their behavior at that time worked for them, as well as how it worked against them. 3. Patients will explore possible new behaviors to use in future anger situations. 4. Patients will learn that anger itself is normal and cannot be eliminated, and that healthier reactions can assist with resolving conflict rather than worsening situations.  Summary of Patient Progress:  The patient shared that her most recent time of anger was currently and has been ongoing for months or even years because of so many deaths that have occurred in her life, including the father of her daughter being murdered in 2016, her sister being murdered in 2020, her grandmother dying in March 2021, her father dying in August 2021, having a miscarriage and loss of her baby the same time, and her cousin dying one week later.  She stated she has been balling the anger up inside herself and when she talks to  people is often very short with them and wants them to stop talking to her.  She stated that her support system makes her grief all about themselves, and she feels that mother blames her for father's death because he was so worried (about patient).  In talking about this, CSW asked her whether her underlying feelings of being blamed and judged are based on correct thinking, or could they possibly be the result of assumptions.  She was tearful throughout group, and her story actually made other patients become tearful too.  However, she calmed considerably prior to the end of group and was told that she is actually doing grief work without realizing it.  Therapeutic Modalities:   Cognitive Behavioral Therapy  Lynnell Chad

## 2020-11-06 NOTE — [facility] Group Notes (Signed)
.  Psychoeducational Group Note  Date: 11-06-20 Time: 0900-1000    Goal Setting   Purpose of Group: This group helps to provide patients with the steps of setting a goal that is specific, measurable, attainable, realistic and time specific. A discussion on how we keep ourselves stuck with negative self talk.    Participation Level:  Active  Participation Quality:  Appropriate  Affect:  Appropriate  Cognitive:  Appropriate  Insight:  Improving  Engagement in Group:  Engaged  Additional Comments: Rates her energy a a 0/10. [name] goal is to verbalize her feelings to the nurse or one of the group leaders today  Dione Housekeeper

## 2020-11-06 NOTE — Progress Notes (Signed)
   11/05/20 2205  COVID-19 Daily Checkoff  Have you had a fever (temp > 37.80C/100F)  in the past 24 hours?  No  If you have had runny nose, nasal congestion, sneezing in the past 24 hours, has it worsened? No  COVID-19 EXPOSURE  Have you traveled outside the state in the past 14 days? No  Have you been in contact with someone with a confirmed diagnosis of COVID-19 or PUI in the past 14 days without wearing appropriate PPE? No  Have you been living in the same home as a person with confirmed diagnosis of COVID-19 or a PUI (household contact)? No  Have you been diagnosed with COVID-19? No

## 2020-11-06 NOTE — Progress Notes (Signed)
   11/06/20 1000  Psych Admission Type (Psych Patients Only)  Admission Status Voluntary  Psychosocial Assessment  Patient Complaints None  Eye Contact Brief  Facial Expression Sad;Flat  Affect Sad;Sullen  Speech Logical/coherent  Interaction Assertive  Motor Activity Slow  Appearance/Hygiene Unremarkable  Behavior Characteristics Cooperative;Appropriate to situation  Mood Pleasant  Thought Process  Coherency WDL  Content WDL  Delusions None reported or observed  Perception WDL  Hallucination None reported or observed  Judgment Poor  Confusion None  Danger to Self  Current suicidal ideation? Denies  Self-Injurious Behavior No self-injurious ideation or behavior indicators observed or expressed   Agreement Not to Harm Self Yes  Description of Agreement Verbal Contract  Danger to Others  Danger to Others None reported or observed

## 2020-11-06 NOTE — Progress Notes (Signed)
   11/06/20 1955  COVID-19 Daily Checkoff  Have you had a fever (temp > 37.80C/100F)  in the past 24 hours?  No  If you have had runny nose, nasal congestion, sneezing in the past 24 hours, has it worsened? No  COVID-19 EXPOSURE  Have you traveled outside the state in the past 14 days? No  Have you been in contact with someone with a confirmed diagnosis of COVID-19 or PUI in the past 14 days without wearing appropriate PPE? No  Have you been living in the same home as a person with confirmed diagnosis of COVID-19 or a PUI (household contact)? No  Have you been diagnosed with COVID-19? No

## 2020-11-06 NOTE — Progress Notes (Signed)
[place] NOVEL CORONAVIRUS (COVID-19) DAILY CHECK-OFF SYMPTOMS - answer yes or no to each - every day NO YES  Have you had a fever in the past 24 hours?  . Fever (Temp > 37.80C / 100F) X   Have you had any of these symptoms in the past 24 hours? . New Cough .  Sore Throat  .  Shortness of Breath .  Difficulty Breathing .  Unexplained Body Aches   X   Have you had any one of these symptoms in the past 24 hours not related to allergies?   . Runny Nose .  Nasal Congestion .  Sneezing   X   If you have had runny nose, nasal congestion, sneezing in the past 24 hours, has it worsened?  X   EXPOSURES - check yes or no X   Have you traveled outside the state in the past 14 days?  X   Have you been in contact with someone with a confirmed diagnosis of COVID-19 or PUI in the past 14 days without wearing appropriate PPE?  X   Have you been living in the same home as a person with confirmed diagnosis of COVID-19 or a PUI (household contact)?    X   Have you been diagnosed with COVID-19?    X              What to do next: Answered NO to all: Answered YES to anything:   Proceed with unit schedule Follow the [facility] Inpatient Flowsheet.   

## 2020-11-06 NOTE — Progress Notes (Signed)
[facility] MD Progress Note  11/06/2020 7:23 AM Alexandra Gray  MRN:  [id number] Subjective:   Alexandra Gray is a 29 yr old female who presents with MDD and SI (Overdose). PPHx is significant for 4 prior hospitalizations, 2 suicide attempts via OD and 1 accidental OD, MDD, Cluster B personality disorder, and PTSD.  She reports today that she has had no issues on restarting her medications except for being a little drowsy today. Discussed that this is common when restarting medications and should pass soon. She reports that today she "feels nothing." She reports that prior to admission she was feeling down but now just feels nothing. She reports that the Claritin has helped some with her congestion but due to the nasal drainage she has a sore throat. She reports that due to this she did not have good sleep and her appetite is not good either. But she reports no other complaints at present. She reports no SI, HI, or AVH.   Principal Problem: MDD (major depressive disorder), recurrent severe, without psychosis (HCC) Diagnosis: Principal Problem:   MDD (major depressive disorder), recurrent severe, without psychosis (HCC) Active Problems:   BMI 50.0-59.9, adult (HCC)   Obesity in pregnancy   Cluster B personality disorder (HCC)   PTSD (post-traumatic stress disorder)  Total Time spent with patient: 15 minutes  Past Psychiatric History: Patient has been hospitalized 4 times in the past. 3 times here at [facility] and 1 time at [facility]. Patient reports that she attempted suicide 2 times and she was hospitalized each of those times. Patient had an accidental OD but was sent to the [facility] after this incident as well. Patient has been following with Alexandra Gray since 2014 and has a therapist with another organization  Past Medical History:  Past Medical History:  Diagnosis Date  . Chlamydia   . Depression    doing ok now  . Gestational diabetes    Pt states she does not have DMII  . Gonorrhea   . History of  pregnancy induced hypertension 12/06/2016  . Hx of trichomoniasis   . Hypertension    sometimes is up, never on meds  . Mental disorder   . Ovarian cyst   . PID (pelvic inflammatory disease)   . Sleep apnea     Past Surgical History:  Procedure Laterality Date  . CESAREAN SECTION N/A 10/03/2015   Procedure: CESAREAN SECTION;  Surgeon: Tilda Burrow, MD;  Location: [facility] ORS;  Service: Obstetrics;  Laterality: N/A;  . CESAREAN SECTION N/A 01/04/2019   Procedure: CESAREAN SECTION;  Surgeon: Hermina Staggers, MD;  Location: [facility] BIRTHING SUITES;  Service: Obstetrics;  Laterality: N/A;  . KNEE SURGERY     Family History:  Family History  Problem Relation Age of Onset  . Healthy Mother   . Healthy Father   . Healthy Brother   . Cancer Neg Hx   . Diabetes Neg Hx   . Heart disease Neg Hx   . Hypertension Neg Hx   . Stroke Neg Hx   . Hearing loss Neg Hx    Family Psychiatric  History: Unknown because patient was adopted at birth Social History:  Social History   Substance and Sexual Activity  Alcohol Use No     Social History   Substance and Sexual Activity  Drug Use No    Social History   Socioeconomic History  . Marital status: Single    Spouse name: Not on file  . Number of children: 2  .  Years of education: 329  . Highest education level: Not on file  Occupational History  . Occupation: disabled  Tobacco Use  . Smoking status: Never Smoker  . Smokeless tobacco: Never Used  Vaping Use  . Vaping Use: Never used  Substance and Sexual Activity  . Alcohol use: No  . Drug use: No  . Sexual activity: Not Currently    Birth control/protection: None  Other Topics Concern  . Not on file  Social History Narrative   Lives alone in a one story home.  Has one child.     On disability for depression and PTSD.     Education: 9th grade.    She is in school for criminal justice.   Social Determinants of Health   Financial Resource Strain: Not on file  Food Insecurity: Not on  file  Transportation Needs: Not on file  Physical Activity: Not on file  Stress: Not on file  Social Connections: Not on file   Additional Social History:                         Sleep: Poor  Appetite:  Poor  Current Medications: Current Facility-Administered Medications  Medication Dose Route Frequency Provider Last Rate Last Admin  . acetaminophen (TYLENOL) tablet 650 mg  650 mg Oral Q6H PRN Rankin, Shuvon B, NP   650 mg at 11/05/20 0807  . alum & mag hydroxide-simeth (MAALOX/MYLANTA) 200-200-20 MG/5ML suspension 30 mL  30 mL Oral Q4H PRN Rankin, Shuvon B, NP      . cephALEXin (KEFLEX) capsule 500 mg  500 mg Oral Q12H Eliseo GumMcQuilla, Jai B, MD   500 mg at 11/05/20 2204  . divalproex (DEPAKOTE) DR tablet 500 mg  500 mg Oral Q12H Eliseo GumMcQuilla, Jai B, MD   500 mg at 11/05/20 2205  . hydrOXYzine (ATARAX/VISTARIL) tablet 25 mg  25 mg Oral TID PRN Rankin, Shuvon B, NP   25 mg at 11/04/20 2112  . loratadine (CLARITIN) tablet 10 mg  10 mg Oral Daily Eliseo GumMcQuilla, Jai B, MD   10 mg at 11/05/20 1536  . magnesium hydroxide (MILK OF MAGNESIA) suspension 30 mL  30 mL Oral Daily PRN Rankin, Shuvon B, NP      . sertraline (ZOLOFT) tablet 100 mg  100 mg Oral Daily Eliseo GumMcQuilla, Jai B, MD        Lab Results: No results found for this or any previous visit (from the past 48 hour(s)).  Blood Alcohol level:  Lab Results  Component Value Date   [facility]   ETH <11 02/15/2014    Metabolic Disorder Labs: Lab Results  Component Value Date   HGBA1C 5.3 11/03/2020   MPG 105.41 11/03/2020   MPG 105 10/30/2010   No results found for: PROLACTIN Lab Results  Component Value Date   CHOL 166 11/03/2020   TRIG 144 11/03/2020   HDL 42 11/03/2020   CHOLHDL 4.0 11/03/2020   VLDL 29 11/03/2020   LDLCALC 95 11/03/2020   LDLCALC (H) 10/30/2010    101        Total Cholesterol/HDL:CHD Risk Coronary Heart Disease Risk Table                     Men   Women  1/2 Average Risk   3.4   3.3  Average Risk        5.0   4.4  2 X Average Risk   9.6   7.1  3 X Average Risk  23.4   11.0        Use the calculated Patient Ratio above and the CHD Risk Table to determine the patient's CHD Risk.        ATP III CLASSIFICATION (LDL):  <100     mg/dL   Optimal  947-096  mg/dL   Near or Above                    Optimal  130-159  mg/dL   Borderline  283-662  mg/dL   High  >947     mg/dL   Very High    Physical Findings: AIMS: Facial and Oral Movements Muscles of Facial Expression: None, normal Lips and Perioral Area: None, normal Jaw: None, normal Tongue: None, normal,Extremity Movements Upper (arms, wrists, hands, fingers): None, normal Lower (legs, knees, ankles, toes): None, normal, Trunk Movements Neck, shoulders, hips: None, normal, Overall Severity Severity of abnormal movements (highest score from questions above): None, normal Incapacitation due to abnormal movements: None, normal Patient's awareness of abnormal movements (rate only patient's report): No Awareness, Dental Status Current problems with teeth and/or dentures?: No Does patient usually wear dentures?: No  CIWA:    COWS:     Musculoskeletal: Strength & Muscle Tone: within normal limits Gait & Station: normal Patient leans: N/A  Psychiatric Specialty Exam: Physical Exam Vitals and nursing note reviewed.  Constitutional:      General: She is not in acute distress.    Appearance: Normal appearance. She is obese. She is not ill-appearing, toxic-appearing or diaphoretic.  HENT:     Head: Normocephalic.  Cardiovascular:     Rate and Rhythm: Normal rate.  Pulmonary:     Effort: Pulmonary effort is normal.  Musculoskeletal:        General: Normal range of motion.  Neurological:     General: No focal deficit present.     Mental Status: She is alert.     Review of Systems  Constitutional: Negative for fatigue and fever.  HENT: Positive for postnasal drip and sore throat.   Respiratory: Negative for chest tightness  and shortness of breath.   Cardiovascular: Negative for chest pain and palpitations.  Gastrointestinal: Negative for abdominal pain, constipation, diarrhea, nausea and vomiting.  Neurological: Negative for dizziness, light-headedness and headaches.  Psychiatric/Behavioral: Negative for agitation, self-injury, sleep disturbance and suicidal ideas.    Blood pressure 126/80, pulse 84, temperature 98.2 F (36.8 C), temperature source Oral, resp. rate 18, height 5' (1.524 m), weight (!) 142.4 kg, last menstrual period 10/24/2020, SpO2 100 %, not currently breastfeeding.Body mass index is 61.32 kg/m.  General Appearance: Casual  Eye Contact:  Good  Speech:  Clear and Coherent and Normal Rate  Volume:  Normal  Mood:  Depressed  Affect:  Depressed  Thought Process:  Coherent  Orientation:  Full (Time, Place, and Person)  Thought Content:  Logical  Suicidal Thoughts:  No  Homicidal Thoughts:  No  Memory:  Immediate;   Fair Recent;   Fair  Judgement:  Fair  Insight:  Fair  Psychomotor Activity:  Normal  Concentration:  Concentration: Fair and Attention Span: Fair  Recall:  [employer] of Knowledge:  Good  Language:  Good  Akathisia:  Negative  Handed:  Right  AIMS (if indicated):     Assets:  Communication Skills Desire for Improvement Housing Resilience  ADL's:  Intact  Cognition:  WNL  Sleep:  Number of Hours: 5.25     Treatment  Plan Summary: Daily contact with patient to assess and evaluate symptoms and progress in treatment  She reports being drowsy today most likely due to her medication restarting. She reports not really feeling anything at present. Reinforced that grieving can take time and that this is ok. Encouraged continuing to attend group therapy. Due to her continued nasal drainage she has throat pain and will provide throat lozenges.  -Continue Zoloft 100mg  -Continue Depakote 500 mg BID -Continue Claritin 10 mg daily -Continue Keflex 500 mg BID   -Continue PRN:  Tylenol, Maalox, Atarax, Milk of Magnesia -Start Cepacol lozenge for sore throat  , MD 11/06/2020, 7:23 AM

## 2020-11-06 NOTE — Progress Notes (Addendum)
   11/06/20 1000  Psych Admission Type (Psych Patients Only)  Admission Status Voluntary  Psychosocial Assessment  Patient Complaints None  Eye Contact Brief  Facial Expression Sad;Flat  Affect Sad;Sullen  Speech Logical/coherent  Interaction Assertive  Motor Activity Slow  Appearance/Hygiene Unremarkable  Behavior Characteristics Cooperative;Appropriate to situation  Mood Pleasant  Thought Process  Coherency WDL  Content WDL  Delusions None reported or observed  Perception WDL  Hallucination None reported or observed  Judgment Poor  Confusion None  Danger to Self  Current suicidal ideation? Denies  Self-Injurious Behavior No self-injurious ideation or behavior indicators observed or expressed   Agreement Not to Harm Self Yes  Description of Agreement Verbal Contract  Danger to Others  Danger to Others None reported or observed   Patient has been up in the dayroom briefly tonight before taking her medications and going to bed. She reported that her day was so-so. She spoke about making sure she stays on her medications after discharge. She was sad when she spoke about her fathers death recently.

## 2020-11-07 DIAGNOSIS — Z79899 Other long term (current) drug therapy: Secondary | ICD-10-CM

## 2020-11-07 DIAGNOSIS — F4321 Adjustment disorder with depressed mood: Secondary | ICD-10-CM

## 2020-11-07 DIAGNOSIS — F4320 Adjustment disorder, unspecified: Secondary | ICD-10-CM

## 2020-11-07 MED ORDER — MELATONIN 3 MG PO TABS
3.0000 mg | ORAL_TABLET | Freq: Every day | ORAL | Status: DC
  Filled 2020-11-07 (×3): qty 1

## 2020-11-07 NOTE — Progress Notes (Signed)
   11/07/20 2224  Psych Admission Type (Psych Patients Only)  Admission Status Voluntary  Psychosocial Assessment  Patient Complaints Depression  Eye Contact Brief  Facial Expression Flat  Affect Appropriate to circumstance  Speech Logical/coherent  Interaction Assertive  Motor Activity Slow  Appearance/Hygiene Unremarkable  Behavior Characteristics Appropriate to situation  Mood Pleasant  Thought Process  Coherency WDL  Content WDL  Delusions None reported or observed  Perception WDL  Hallucination None reported or observed  Judgment Poor  Confusion None  Danger to Self  Current suicidal ideation? Denies  Self-Injurious Behavior No self-injurious ideation or behavior indicators observed or expressed   Agreement Not to Harm Self Yes  Description of Agreement Verbal Contract  Danger to Others  Danger to Others None reported or observed

## 2020-11-07 NOTE — [facility] Group Notes (Signed)
[facility] LCSW Group Therapy Note  11/07/2020    Type of Therapy and Topic:  Group Therapy:  Adding Supports Including Yourself  Participation Level:  Active   Description of Group:   Patients in this group were introduced to the concept that additional supports including self-support are an essential part of recovery.  Patients listed what supports they believe they need to add to their lives to achieve their goals at discharge, and they listed such things as therapist, family, doctor, support groups, 12-step groups and service animals.   A song entitled "My Own Hero" was played and a group discussion ensued in which patients stated they could relate to the song and it inspired them to realize they have be willing to help themselves in order to succeed, because other people cannot achieve sobriety or stability for them.  Additional songs were played ("Fight For It" then "I Am Enough") to encourage patients toward self-advocacy and self-support as part of their recovery.  They discussed their reactions to these songs' messages, which were positive and hopeful.  Therapeutic Goals: 1)  demonstrate the importance of being a key part of one's own support system 2)  discuss various available supports 3)  encourage patient to use music as part of their self-support and focus on goals 4)  elicit ideas from patients about supports that need to be added   Summary of Patient Progress:  The patient expressed that supports she needs to add at discharge include a grief counselor and peers who have also been through loss and understand her.  She also needs to be a better self-support by stopping her tendency to hold things in.  She was very interactive during group and shared her story in depth.  Her reaction to the various songs played was positive and welcoming.  Therapeutic Modalities:   Motivational Interviewing Activity  Lynnell Chad

## 2020-11-07 NOTE — Progress Notes (Signed)
[place] NOVEL CORONAVIRUS (COVID-19) DAILY CHECK-OFF SYMPTOMS - answer yes or no to each - every day NO YES  Have you had a fever in the past 24 hours?  . Fever (Temp > 37.80C / 100F) X   Have you had any of these symptoms in the past 24 hours? . New Cough .  Sore Throat  .  Shortness of Breath .  Difficulty Breathing .  Unexplained Body Aches   X   Have you had any one of these symptoms in the past 24 hours not related to allergies?   . Runny Nose .  Nasal Congestion .  Sneezing   X   If you have had runny nose, nasal congestion, sneezing in the past 24 hours, has it worsened?  X   EXPOSURES - check yes or no X   Have you traveled outside the state in the past 14 days?  X   Have you been in contact with someone with a confirmed diagnosis of COVID-19 or PUI in the past 14 days without wearing appropriate PPE?  X   Have you been living in the same home as a person with confirmed diagnosis of COVID-19 or a PUI (household contact)?    X   Have you been diagnosed with COVID-19?    X              What to do next: Answered NO to all: Answered YES to anything:   Proceed with unit schedule Follow the [facility] Inpatient Flowsheet.   

## 2020-11-07 NOTE — Progress Notes (Addendum)
   11/07/20 1440  Psych Admission Type (Psych Patients Only)  Admission Status Voluntary  Psychosocial Assessment  Patient Complaints Other (Comment) (pt states, "I feel numb")  Eye Contact Brief  Facial Expression Sad;Flat  Affect Sad;Sullen  Speech Logical/coherent  Interaction Assertive  Motor Activity Slow  Appearance/Hygiene Unremarkable  Behavior Characteristics Cooperative;Appropriate to situation;Calm  Mood Pleasant;Preoccupied  Thought Process  Coherency WDL  Content WDL  Delusions None reported or observed  Perception WDL  Hallucination None reported or observed  Judgment Poor  Confusion None  Danger to Self  Current suicidal ideation? Denies  Self-Injurious Behavior No self-injurious ideation or behavior indicators observed or expressed   Agreement Not to Harm Self Yes  Description of Agreement Verbal Contract  Danger to Others  Danger to Others None reported or observed    D. Pt presents with a sad affect and mood, stating earlier this am that she "felt numb". Per pt's self inventory, pt rated her depression, hopelessness and anxiety a 3/1/2, respectively. Pt observed in the dayroom attending group this am. Pt currently denies SI/HI and AVH A. Labs and vitals monitored. Pt given and educated on medications. Pt supported emotionally and encouraged to express concerns and ask questions.   R. Pt remains safe with 15 minute checks. Will continue POC.

## 2020-11-07 NOTE — Progress Notes (Addendum)
[facility] MD Progress Note  11/07/2020 1:08 PM Alexandra Gray  MRN:  [id number] Subjective:  This AM patient reports that her sleep is less hindered by her sinuses but she is still having insomnia. Patient reports that her poor sleep is due to constant "tossing and turning" related to thinking about her recently deceased father. Patient reports that just thinking about him makes her both sad and angry. Patient reports that she is very angry right now; angry that he would would leave. Patient is also angry that her cousin was found dead a week after her father's death and she feels "it is unfair." Patient reports that she is having a hard time processing the loss and she feels that talking about her emotions is very hard. SW reports that patient has been doing a very good job opening up about her emotions in group sessions. On interview this AM patient acknowledged that opening up makes her more upset and she wants to shutdown because of all the anger she is feeling. This AM patient reports that her mood is "numb." Patient does contract for safety. Patient recognizes that she needs to be in the life of her children despite feeling that it is difficult to live without her father.  Principal Problem: MDD (major depressive disorder), recurrent severe, without psychosis (HCC) Diagnosis: Principal Problem:   MDD (major depressive disorder), recurrent severe, without psychosis (HCC) Active Problems:   BMI 50.0-59.9, adult (HCC)   Obesity in pregnancy   Cluster B personality disorder (HCC)   PTSD (post-traumatic stress disorder)  Total Time spent with patient: 30 minutes  Past Psychiatric History: See H&P  Past Medical History:  Past Medical History:  Diagnosis Date  . Chlamydia   . Depression    doing ok now  . Gestational diabetes    Pt states she does not have DMII  . Gonorrhea   . History of pregnancy induced hypertension 12/06/2016  . Hx of trichomoniasis   . Hypertension    sometimes is up,  never on meds  . Mental disorder   . Ovarian cyst   . PID (pelvic inflammatory disease)   . Sleep apnea     Past Surgical History:  Procedure Laterality Date  . CESAREAN SECTION N/A 10/03/2015   Procedure: CESAREAN SECTION;  Surgeon: Tilda Burrow, MD;  Location: [facility];  Service: Obstetrics;  Laterality: N/A;  . CESAREAN SECTION N/A 01/04/2019   Procedure: CESAREAN SECTION;  Surgeon: Hermina Staggers, MD;  Location: [facility] BIRTHING SUITES;  Service: Obstetrics;  Laterality: N/A;  . KNEE SURGERY     Family History:  Family History  Problem Relation Age of Onset  . Healthy Mother   . Healthy Father   . Healthy Brother   . Cancer Neg Hx   . Diabetes Neg Hx   . Heart disease Neg Hx   . Hypertension Neg Hx   . Stroke Neg Hx   . Hearing loss Neg Hx    Family Psychiatric  History: See H&P Social History:  Social History   Substance and Sexual Activity  Alcohol Use No     Social History   Substance and Sexual Activity  Drug Use No    Social History   Socioeconomic History  . Marital status: Single    Spouse name: Not on file  . Number of children: 2  . Years of education: 9  . Highest education level: Not on file  Occupational History  . Occupation: disabled  Tobacco Use  .  Smoking status: Never Smoker  . Smokeless tobacco: Never Used  Vaping Use  . Vaping Use: Never used  Substance and Sexual Activity  . Alcohol use: No  . Drug use: No  . Sexual activity: Not Currently    Birth control/protection: None  Other Topics Concern  . Not on file  Social History Narrative   Lives alone in a one story home.  Has one child.     On disability for depression and PTSD.     Education: 9th grade.    She is in school for criminal justice.   Social Determinants of Health   Financial Resource Strain: Not on file  Food Insecurity: Not on file  Transportation Needs: Not on file  Physical Activity: Not on file  Stress: Not on file  Social Connections: Not on file    Additional Social History:                         Sleep: Poor  Appetite:  Good  Current Medications: Current Facility-Administered Medications  Medication Dose Route Frequency Provider Last Rate Last Admin  . acetaminophen (TYLENOL) tablet 650 mg  650 mg Oral Q6H PRN Rankin, Shuvon B, NP   650 mg at 11/06/20 1242  . alum & mag hydroxide-simeth (MAALOX/MYLANTA) 200-200-20 MG/5ML suspension 30 mL  30 mL Oral Q4H PRN Rankin, Shuvon B, NP      . cephALEXin (KEFLEX) capsule 500 mg  500 mg Oral Q12H Eliseo Gum B, MD   500 mg at 11/07/20 0744  . divalproex (DEPAKOTE) DR tablet 500 mg  500 mg Oral Q12H Eliseo Gum B, MD   500 mg at 11/07/20 0744  . fluticasone (FLONASE) 50 MCG/ACT nasal spray 1 spray  1 spray Each Nare Daily Lauro Franklin, MD   1 spray at 11/07/20 0744  . hydrOXYzine (ATARAX/VISTARIL) tablet 25 mg  25 mg Oral TID PRN Rankin, Shuvon B, NP   25 mg at 11/04/20 2112  . loratadine (CLARITIN) tablet 10 mg  10 mg Oral Daily Eliseo Gum B, MD   10 mg at 11/07/20 0744  . magnesium hydroxide (MILK OF MAGNESIA) suspension 30 mL  30 mL Oral Daily PRN Rankin, Shuvon B, NP      . melatonin tablet 3 mg  3 mg Oral QHS Draken Farrior B, MD      . menthol-cetylpyridinium (CEPACOL) lozenge 3 mg  1 lozenge Oral PRN Lauro Franklin, MD      . sertraline (ZOLOFT) tablet 100 mg  100 mg Oral Daily Eliseo Gum B, MD   100 mg at 11/07/20 0744    Lab Results: No results found for this or any previous visit (from the past 48 hour(s)).  Blood Alcohol level:  Lab Results  Component Value Date   [facility] <10 11/03/2020   ETH <11 02/15/2014    Metabolic Disorder Labs: Lab Results  Component Value Date   HGBA1C 5.3 11/03/2020   MPG 105.41 11/03/2020   MPG 105 10/30/2010   No results found for: PROLACTIN Lab Results  Component Value Date   CHOL 166 11/03/2020   TRIG 144 11/03/2020   HDL 42 11/03/2020   CHOLHDL 4.0 11/03/2020   VLDL 29 11/03/2020   LDLCALC 95  11/03/2020   LDLCALC (H) 10/30/2010    101        Total Cholesterol/HDL:CHD Risk Coronary Heart Disease Risk Table  Men   Women  1/2 Average Risk   3.4   3.3  Average Risk       5.0   4.4  2 X Average Risk   9.6   7.1  3 X Average Risk  23.4   11.0        Use the calculated Patient Ratio above and the CHD Risk Table to determine the patient's CHD Risk.        ATP III CLASSIFICATION (LDL):  <100     mg/dL   Optimal  [id number]  mg/dL   Near or Above                    Optimal  130-159  mg/dL   Borderline  [id number]  mg/dL   High  >811>190     mg/dL   Very High    Physical Findings: AIMS: Facial and Oral Movements Muscles of Facial Expression: None, normal Lips and Perioral Area: None, normal Jaw: None, normal Tongue: None, normal,Extremity Movements Upper (arms, wrists, hands, fingers): None, normal Lower (legs, knees, ankles, toes): None, normal, Trunk Movements Neck, shoulders, hips: None, normal, Overall Severity Severity of abnormal movements (highest score from questions above): None, normal Incapacitation due to abnormal movements: None, normal Patient's awareness of abnormal movements (rate only patient's report): No Awareness, Dental Status Current problems with teeth and/or dentures?: No Does patient usually wear dentures?: No  CIWA:    COWS:     Musculoskeletal: Strength & Muscle Tone: within normal limits Gait & Station: normal Patient leans: N/A  Psychiatric Specialty Exam: Physical Exam HENT:     Head: Normocephalic and atraumatic.  Pulmonary:     Effort: Pulmonary effort is normal.  Neurological:     Mental Status: She is alert.   Patient middle finger on left hand is healing well.  Review of Systems  Constitutional: Negative for appetite change.  HENT: Positive for rhinorrhea.   Psychiatric/Behavioral: Negative for hallucinations.    Blood pressure (!) 126/95, pulse 75, temperature 98 F (36.7 C), temperature source Oral, resp.  rate 18, height 5' (1.524 m), weight (!) 142.4 kg, last menstrual period 10/24/2020, SpO2 100 %, not currently breastfeeding.Body mass index is 61.32 kg/m.  General Appearance: Casual hair is still unkempt  Eye Contact:  Fair  Speech:  Clear and Coherent  Volume:  Normal  Mood:  "numb" and angry  Affect:  Depressed  Thought Process:  Coherent  Orientation:  Full (Time, Place, and Person)  Thought Content:  Logical  Suicidal Thoughts:  No  Homicidal Thoughts:  No  Memory:  Recent;   Fair  Judgement:  Fair  Insight:  Fair  Psychomotor Activity:  Normal  Concentration:  Concentration: Fair  Recall:  NA  Fund of Knowledge:  NA  Language:  Good  Akathisia:  No  Handed:  Right  AIMS (if indicated):     Assets:  Communication Skills Desire for Improvement Housing Leisure Time Resilience  ADL's:  Intact  Cognition:  WNL  Sleep:  Number of Hours: 6.75     Treatment Plan Summary: Daily contact with patient to assess and evaluate symptoms and progress in treatment  Mrs. Lily PeerFernandez is a 29 yo patient w/ hx of MDD, anxiety, mood instability and PTSD who presents with symptoms of MDD and Bereavement. Patient continues to endorse that she is depressed and struggling to find motivation to continue her daily activities; however patient has been able to interact well on the unit and has been  able to complete task that help her feel better. Patient endorses that she is still having a hard time with the death of her father and would benefit greatly from Grief counseling and possibly trauma counseling as well to deal with some of the traumas in her past. Patient is open to both types of counseling. Patient will need these therapies to continue her treatment outpatient in combination with her medications. MDD, recurrent, moderate, w/o psychosis Bereavment - Zoloft 100mg  - 500 mg BID Depakote - continue to suggest attendance of group therapies - SW to investigate OP Grief therapy   Finger  Injury Patient was evaluated in ED and concluded to have pus in the injury site. The pus was drained and appeared to be infected. Patient was started on Keflex - Keflex 500mg  BID   Inflammation of the Sinuses Patient reports that the flonase is really helping. Patient sounded less congested today. - Claritin 10mg  daily - Flonase PGY-1 , MD 11/07/2020, 1:08 PM

## 2020-11-07 NOTE — Progress Notes (Signed)
   11/07/20 2220  COVID-19 Daily Checkoff  Have you had a fever (temp > 37.80C/100F)  in the past 24 hours?  No  If you have had runny nose, nasal congestion, sneezing in the past 24 hours, has it worsened? No  COVID-19 EXPOSURE  Have you traveled outside the state in the past 14 days? No  Have you been in contact with someone with a confirmed diagnosis of COVID-19 or PUI in the past 14 days without wearing appropriate PPE? No  Have you been living in the same home as a person with confirmed diagnosis of COVID-19 or a PUI (household contact)? No  Have you been diagnosed with COVID-19? No

## 2020-11-07 NOTE — Progress Notes (Signed)
[place] NOVEL CORONAVIRUS (COVID-19) DAILY CHECK-OFF SYMPTOMS - answer yes or no to each - every day NO YES  Have you had a fever in the past 24 hours?  . Fever (Temp > 37.80C / 100F) X   Have you had any of these symptoms in the past 24 hours? . New Cough .  Sore Throat  .  Shortness of Breath .  Difficulty Breathing .  Unexplained Body Aches   X   Have you had any one of these symptoms in the past 24 hours not related to allergies?   . Runny Nose .  Nasal Congestion .  Sneezing   X   If you have had runny nose, nasal congestion, sneezing in the past 24 hours, has it worsened?  X   EXPOSURES - check yes or no X   Have you traveled outside the state in the past 14 days?  X   Have you been in contact with someone with a confirmed diagnosis of COVID-19 or PUI in the past 14 days without wearing appropriate PPE?  X   Have you been living in the same home as a person with confirmed diagnosis of COVID-19 or a PUI (household contact)?    X   Have you been diagnosed with COVID-19?    X              What to do next: Answered NO to all: Answered YES to anything:   Proceed with unit schedule Follow the [facility] Inpatient Flowsheet.   

## 2020-11-08 LAB — COMPREHENSIVE METABOLIC PANEL
ALT: 17 U/L (ref 0–44)
AST: 13 U/L — ABNORMAL LOW (ref 15–41)
Albumin: 3.7 g/dL (ref 3.5–5.0)
Alkaline Phosphatase: 82 U/L (ref 38–126)
Anion gap: 13 (ref 5–15)
BUN: 11 mg/dL (ref 6–20)
CO2: 26 mmol/L (ref 22–32)
Calcium: 9.2 mg/dL (ref 8.9–10.3)
Chloride: 96 mmol/L — ABNORMAL LOW (ref 98–111)
Creatinine, Ser: 0.69 mg/dL (ref 0.44–1.00)
GFR, Estimated: >60 mL/min (ref >60)
Glucose, Bld: 85 mg/dL (ref 70–99)
Potassium: 4.1 mmol/L (ref 3.5–5.1)
Sodium: 135 mmol/L (ref 135–145)
Total Bilirubin: 0.4 mg/dL (ref 0.3–1.2)
Total Protein: 8 g/dL (ref 6.5–8.1)

## 2020-11-08 LAB — CBC WITH DIFFERENTIAL/PLATELET
Abs Immature Granulocytes: 0.1 10*3/uL — ABNORMAL HIGH (ref 0.00–0.07)
Basophils Absolute: 0.1 10*3/uL (ref 0.0–0.1)
Basophils Relative: 1 %
Eosinophils Absolute: 0.3 10*3/uL (ref 0.0–0.5)
Eosinophils Relative: 2 %
HCT: 46.3 % — ABNORMAL HIGH (ref 36.0–46.0)
Hemoglobin: 14.7 g/dL (ref 12.0–15.0)
Immature Granulocytes: 1 %
Lymphocytes Relative: 29 %
Lymphs Abs: 3.3 10*3/uL (ref 0.7–4.0)
MCH: 26.3 pg (ref 26.0–34.0)
MCHC: 31.7 g/dL (ref 30.0–36.0)
MCV: 82.7 fL (ref 80.0–100.0)
Monocytes Absolute: 0.5 10*3/uL (ref 0.1–1.0)
Monocytes Relative: 4 %
Neutro Abs: 7.1 10*3/uL (ref 1.7–7.7)
Neutrophils Relative %: 63 %
Platelets: 355 10*3/uL (ref 150–400)
RBC: 5.6 MIL/uL — ABNORMAL HIGH (ref 3.87–5.11)
RDW: 14.4 % (ref 11.5–15.5)
WBC: 11.3 10*3/uL — ABNORMAL HIGH (ref 4.0–10.5)
nRBC: 0 % (ref 0.0–0.2)

## 2020-11-08 LAB — VALPROIC ACID LEVEL: Valproic Acid Lvl: 74 ug/mL (ref 50.0–100.0)

## 2020-11-08 MED ORDER — FLUTICASONE PROPIONATE 50 MCG/ACT NA SUSP: 1.0000 | Freq: Every day | NASAL | 0 refills | Status: DC

## 2020-11-08 MED ORDER — MELATONIN 3 MG PO TABS: 3.0000 mg | ORAL_TABLET | Freq: Every day | ORAL | 0 refills | Status: DC

## 2020-11-08 MED ORDER — CEPHALEXIN 500 MG PO CAPS: 500.0000 mg | ORAL_CAPSULE | Freq: Two times a day (BID) | ORAL | 0 refills | Status: DC

## 2020-11-08 MED ORDER — DIVALPROEX SODIUM 500 MG PO DR TAB: 500.0000 mg | DELAYED_RELEASE_TABLET | Freq: Two times a day (BID) | ORAL | 0 refills | Status: DC

## 2020-11-08 MED ORDER — CEPHALEXIN 500 MG PO CAPS: 500.0000 mg | ORAL_CAPSULE | Freq: Two times a day (BID) | ORAL | 0 refills | Status: AC

## 2020-11-08 MED ORDER — SERTRALINE HCL 100 MG PO TABS: 100.0000 mg | ORAL_TABLET | Freq: Every day | ORAL | 0 refills | Status: DC

## 2020-11-08 NOTE — Progress Notes (Signed)
Discharge:  Patient discharged home with family member.  Suicide prevention information given and discussed with patient who stated she understood and had no questions.  Patient stated she received all her belongings, clothing, toiletries, misc items, etc.  Patient stated she appreciated all assistance received from [facility] staff.  All required discharge information given to patient.

## 2020-11-08 NOTE — Progress Notes (Signed)
  [facility] Adult Case Management Discharge Plan :  Will you be returning to the same living situation after discharge:  Yes,  home with god-mother At discharge, do you have transportation home?: Yes,  via mother Do you have the ability to pay for your medications: Yes,  has medicaid  Release of information consent forms completed and in the chart;  Patient's signature needed at discharge.  Patient to Follow up at:  Follow-up Information    [facility]. Schedule an appointment as soon as possible for a visit.   Why: Please contact your current provider for therapy services. Contact information: [address] [place], [place], [address]  Phone Number [phone] Fax Number [phone]       Vesta Mixer Follow up on 11/10/2020.   Why: You have an appointment on 11/10/20 at 10:00 am for medication management services.  This will be a telehealth appointment. Contact information: [address]  [address] [place] [address] [phone]               Next level of care provider has access to [facility] Link:no  Safety Planning and Suicide Prevention discussed: Yes,   Rebeca Alert (God-mother) [phone]  Have you used any form of tobacco in the last 30 days? (Cigarettes, Smokeless Tobacco, Cigars, and/or Pipes): No  Has patient been referred to the Quitline?: N/A patient is not a smoker  Patient has been referred for addiction treatment: N/A  Felizardo Hoffmann, LCSWA 11/08/2020, 9:22 AM

## 2020-11-08 NOTE — [facility] Counselor (Signed)
CSW placed group resources in pt's chart per the pt's request.  Fredirick Lathe, LCSWA Clinicial Social Worker [employer]

## 2020-11-08 NOTE — Progress Notes (Signed)
Spiritual care group on grief and loss facilitated by chaplain Burnis Kingfisher  Group Goal:  Support / Education around grief and loss  Members engage in facilitated group support and psycho-social education.  Group Description:  Following introductions and group rules, group members engaged in facilitated group dialog and support around topic of loss, with particular support around experiences of loss in their lives. Group Identified types of loss (relationships / self / things) and identified patterns, circumstances, and changes that precipitate losses. Reflected on thoughts / feelings around loss, normalized grief responses, and recognized variety in grief experience.  Patient Progress: Alexandra Gray was present throughout group.  Did not engage in group discussion.

## 2020-11-08 NOTE — Progress Notes (Signed)
Recreation Therapy Notes  Date:  12.13.21 Time: 0930 Location: [address] Group Room  Group Topic: Stress Management  Goal Area(s) Addresses:  Patient will identify positive stress management techniques. Patient will identify benefits of using stress management post d/c.  Intervention:  Stress Management  Activity: Meditation.  LRT played a meditation that focused on making the most of each moment of the day and the possibilities that could arise from it.    Education:  Stress Management, Discharge Planning.   Education Outcome: Acknowledges Education  Clinical Observations/Feedback: Pt did not attend group activity.    Caroll Rancher, LRT/CTRS         Caroll Rancher A 11/08/2020 11:54 AM

## 2020-11-08 NOTE — Discharge Summary (Signed)
Physician Discharge Summary Note  Patient:  Alexandra Gray is an 29 y.o., female MRN:  [id number] DOB:  1991-01-13 Patient phone:  [phone] (home)  Patient address:   [address] [place] [address],  Total Time spent with patient: 20 minutes  Date of Admission:  11/03/2020 Date of Discharge: 11/08/2020  Reason for Admission:  Mood stabilization  Principal Problem: MDD (major depressive disorder), recurrent severe, without psychosis (HCC) Discharge Diagnoses: Principal Problem:   MDD (major depressive disorder), recurrent severe, without psychosis (HCC) Active Problems:   BMI 50.0-59.9, adult (HCC)   Cluster B personality disorder (HCC)   PTSD (post-traumatic stress disorder)   Grief reaction   High risk medication use   Past Psychiatric History: See H & P  Past Medical History:  Past Medical History:  Diagnosis Date  . Chlamydia   . Depression    doing ok now  . Gestational diabetes    Pt states she does not have DMII  . Gonorrhea   . History of pregnancy induced hypertension 12/06/2016  . Hx of trichomoniasis   . Hypertension    sometimes is up, never on meds  . Mental disorder   . Ovarian cyst   . PID (pelvic inflammatory disease)   . Sleep apnea     Past Surgical History:  Procedure Laterality Date  . CESAREAN SECTION N/A 10/03/2015   Procedure: CESAREAN SECTION;  Surgeon: Tilda Burrow, MD;  Location: [facility];  Service: Obstetrics;  Laterality: N/A;  . CESAREAN SECTION N/A 01/04/2019   Procedure: CESAREAN SECTION;  Surgeon: Hermina Staggers, MD;  Location: [facility] BIRTHING SUITES;  Service: Obstetrics;  Laterality: N/A;  . KNEE SURGERY     Family History:  Family History  Problem Relation Age of Onset  . Healthy Mother   . Healthy Father   . Healthy Brother   . Cancer Neg Hx   . Diabetes Neg Hx   . Heart disease Neg Hx   . Hypertension Neg Hx   . Stroke Neg Hx   . Hearing loss Neg Hx    Family Psychiatric  History: See H &  P Social History:  Social History   Substance and Sexual Activity  Alcohol Use No     Social History   Substance and Sexual Activity  Drug Use No    Social History   Socioeconomic History  . Marital status: Single    Spouse name: Not on file  . Number of children: 2  . Years of education: 9  . Highest education level: Not on file  Occupational History  . Occupation: disabled  Tobacco Use  . Smoking status: Never Smoker  . Smokeless tobacco: Never Used  Vaping Use  . Vaping Use: Never used  Substance and Sexual Activity  . Alcohol use: No  . Drug use: No  . Sexual activity: Not Currently    Birth control/protection: None  Other Topics Concern  . Not on file  Social History Narrative   Lives alone in a one story home.  Has one child.     On disability for depression and PTSD.     Education: 9th grade.    She is in school for criminal justice.   Social Determinants of Health   Financial Resource Strain: Not on file  Food Insecurity: Not on file  Transportation Needs: Not on file  Physical Activity: Not on file  Stress: Not on file  Social Connections: Not on file    Hospital Course:  History of Present Illness: [name] is a 29 yo patient w/ PMH of MDD, PTSD, and mood instability. Patient reports that she has been feeling depressed since 06/2020 when her father died and she suffered a miscarriage on the same day. Patient reports that she has been having SI daily with the plan of OD. Patient has OD 3x in the past. Once was accidental and the other two attempts were with trazodone. Patient reports that there was nothing that specifically triggered her coming [facility] but she felt that she was no longer able to regulate her depression on her own. Patient reports that people around her have noted that she has been behaving differently since the death of her father and she reports that she has been self-isolating and her mood has become more irritable  more frequently. Patient also endorses some guilt. Although her father had been sick and she was closest to him, she feels guilty that she did not see her father on the day he died despite the fact that she was on her way to see him. Patient also reports that her mother tells her that her father would not have gotten sick if the patient had not moved out.  Patient reports that she is very nervous about her mood irregularity because she will often be easy to become angry or upset the patient does not want to harm her 445 and 29 year old children in a moment where her she is more irritable. Patient reports that she was following with [facility] and she was last seen 3-4 months ago and was started on depakote for her mood instability , Zoloft for depression, and remeron for mood as well.  Patient reports that she did not like that the remeron made her sleepy because she felt that she could not take care of her children if they needed her at night. Patient reports that she was stopped taking all of her meds after her father died due to "feeling like there was no point anymore" and she realizes that she does need them and would like to restart medications for depression and mood instability.    Alexandra Gray was admitted to the adult 300 unit where she was evaluated and her symptoms were identified. Medication management was discussed and implemented. Her zoloft was increased to 100 mg daily for depressive symptoms. She was encouraged to participate in unit programming. Medical problems were identified and treated appropriately. She was started on a course of keflex for a skin infection of her finger. She received flonase for treatment of allergies. Home medication was restarted as needed. She was evaluated each day by a clinical provider to ascertain the patient's response to treatment.  Improvement was noted by the patient's report of decreasing symptoms, improved sleep and appetite, affect, medication tolerance,  behavior, and participation in unit programming.  The patient was asked each day to complete a self inventory noting mood, mental status, pain, new symptoms, anxiety and concerns.         She responded well to medication and being in a therapeutic and supportive environment. Positive and appropriate behavior was noted and the patient was motivated for recovery.  She worked closely with the treatment team and case manager to develop a discharge plan with appropriate goals. Coping skills, problem solving as well as relaxation therapies were also part of the unit programming.         By the day of discharge she was in much improved condition than  upon admission.  Symptoms were reported as significantly decreased or resolved completely. The patient denied SI/HI and voiced no AVH. She was motivated to continue taking medication with a goal of continued improvement in mental health.          Alexandra Gray was discharged home with a plan to follow up as noted below.  Physical Findings: AIMS: Facial and Oral Movements Muscles of Facial Expression: None, normal Lips and Perioral Area: None, normal Jaw: None, normal Tongue: None, normal,Extremity Movements Upper (arms, wrists, hands, fingers): None, normal Lower (legs, knees, ankles, toes): None, normal, Trunk Movements Neck, shoulders, hips: None, normal, Overall Severity Severity of abnormal movements (highest score from questions above): None, normal Incapacitation due to abnormal movements: None, normal Patient's awareness of abnormal movements (rate only patient's report): No Awareness, Dental Status Current problems with teeth and/or dentures?: No Does patient usually wear dentures?: No  CIWA:    COWS:     Musculoskeletal: Strength & Muscle Tone: within normal limits Gait & Station: normal Patient leans: N/A  Psychiatric Specialty Exam: Physical Exam  Review of Systems  Blood pressure (!) 129/103, pulse 88, temperature 97.8 F (36.6  C), temperature source Oral, resp. rate 18, height 5' (1.524 m), weight (!) 142.4 kg, last menstrual period 10/24/2020, SpO2 100 %, not currently breastfeeding.Body mass index is 61.32 kg/m.  General Appearance: Casual  Eye Contact:  Fair  Speech:  Clear and Coherent  Volume:  Normal  Mood:  Anxious  Affect:  Congruent  Thought Process:  Goal Directed and Linear  Orientation:  Full (Time, Place, and Person)  Thought Content:  WDL  Suicidal Thoughts:  No  Homicidal Thoughts:  No  Memory:  Immediate;   Good  Judgement:  Other:  Improved  Insight:  Fair  Psychomotor Activity:  Normal  Concentration:  Concentration: Good and Attention Span: Good  Recall:  Good  Fund of Knowledge:  Good  Language:  Good  Akathisia:  No  Handed:  See H & P  AIMS (if indicated):     Assets:  Communication Skills Desire for Improvement Resilience Social Support  ADL's:  Intact  Cognition:  WNL  Sleep:  Number of Hours: 6.75     Have you used any form of tobacco in the last 30 days? (Cigarettes, Smokeless Tobacco, Cigars, and/or Pipes): No  Has this patient used any form of tobacco in the last 30 days? (Cigarettes, Smokeless Tobacco, Cigars, and/or Pipes) Yes, No  Blood Alcohol level:  Lab Results  Component Value Date   ETH <10 11/03/2020   ETH <11 02/15/2014    Metabolic Disorder Labs:  Lab Results  Component Value Date   HGBA1C 5.3 11/03/2020   MPG 105.41 11/03/2020   MPG 105 10/30/2010   No results found for: PROLACTIN Lab Results  Component Value Date   CHOL 166 11/03/2020   TRIG 144 11/03/2020   HDL 42 11/03/2020   CHOLHDL 4.0 11/03/2020   VLDL 29 11/03/2020   LDLCALC 95 11/03/2020   LDLCALC (H) 10/30/2010    101        Total Cholesterol/HDL:CHD Risk Coronary Heart Disease Risk Table                     Men   Women  1/2 Average Risk   3.4   3.3  Average Risk       5.0   4.4  2 X Average Risk   9.6   7.1  3 X  Average Risk  23.4   11.0        Use the calculated  Patient Ratio above and the CHD Risk Table to determine the patient's CHD Risk.        ATP III CLASSIFICATION (LDL):  <100     mg/dL   Optimal  191-478  mg/dL   Near or Above                    Optimal  130-159  mg/dL   Borderline  295-621  mg/dL   High  >308     mg/dL   Very High    See Psychiatric Specialty Exam and Suicide Risk Assessment completed by Attending Physician prior to discharge.  Discharge destination:  Home  Is patient on multiple antipsychotic therapies at discharge:  No   Has Patient had three or more failed trials of antipsychotic monotherapy by history:  No  Recommended Plan for Multiple Antipsychotic Therapies: NA  Discharge Instructions    Diet - low sodium heart healthy   Complete by: As directed    Increase activity slowly   Complete by: As directed      Allergies as of 11/08/2020      Reactions   Infuvite Adult [multiple Vitamin] Shortness Of Breath   Nubain [nalbuphine Hcl] Other (See Comments)   Makes patient hot      Medication List    STOP taking these medications   hydrOXYzine 25 MG tablet Commonly known as: ATARAX/VISTARIL   QUEtiapine 50 MG tablet Commonly known as: SEROQUEL     TAKE these medications     Indication  cephALEXin 500 MG capsule Commonly known as: KEFLEX Take 1 capsule (500 mg total) by mouth every 12 (twelve) hours.  Indication: Infection of the Skin and/or Skin Structures   cephALEXin 500 MG capsule Commonly known as: KEFLEX Take 1 capsule (500 mg total) by mouth 2 (two) times daily for 7 doses.  Indication: Infection of the Skin and/or Skin Structures   divalproex 500 MG DR tablet Commonly known as: DEPAKOTE Take 1 tablet (500 mg total) by mouth every 12 (twelve) hours.  Indication: Manic Phase of Manic-Depression   fluticasone 50 MCG/ACT nasal spray Commonly known as: FLONASE Place 1 spray into both nostrils daily. Start taking on: November 09, 2020  Indication: Stuffy Nose   melatonin 3 MG Tabs  tablet Take 1 tablet (3 mg total) by mouth at bedtime.  Indication: Trouble Sleeping   sertraline 100 MG tablet Commonly known as: ZOLOFT Take 1 tablet (100 mg total) by mouth daily. Start taking on: November 09, 2020 What changed:   medication strength  how much to take  when to take this  Indication: Major Depressive Disorder       Follow-up Information    [facility]. Schedule an appointment as soon as possible for a visit.   Why: Please contact your current provider for therapy services. Contact information: [address] [place], [place], [address]  Phone Number [phone] Fax Number [phone]       [facility] Mixer Follow up on 11/10/2020.   Why: You have an appointment on 11/10/20 at 10:00 am for medication management services.  This will be a telehealth appointment. Contact information: [address]  [address] [place] [address] [phone]               Follow-up recommendations:    Outpatient psychotherapy and medication management as noted above. Medication compliance and need for birth  control while on VPA stressed to patient.   SignedFransisca Kaufmann, NP 11/08/2020, 9:35 AM

## 2020-11-08 NOTE — Plan of Care (Signed)
Nurse discussed anxiety, depression and coping skills with patient.  

## 2020-11-08 NOTE — Progress Notes (Signed)
D:  Patient's self inventory sheet, patient has fair sleep, no sleep medication.  Fair appetite, low energy level, poor concentration.  Rated depression 3, hopeless 1, anxiety 3.  Denied withdrawals.  Denied SI.  Denied physical problems.  Denied physical pain.  Goal is express dealings.  Plans to express herself.  Does have discharge plans. A:  Medications administered per MD orders.  Emotional support and encouragement given patient. R:  Denied SI and HI, contracts for safety.  Denied A/V hallucinations.  Safety maintained with 15 minute checks.
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
## 2020-11-14 ENCOUNTER — Emergency Department (HOSPITAL_COMMUNITY): Payer: Medicaid Other

## 2020-11-14 ENCOUNTER — Emergency Department (HOSPITAL_COMMUNITY)
Admission: EM | Admit: 2020-11-14 | Discharge: 2020-11-14 | Disposition: A | Discharge status: Home / Self Care | Payer: Medicaid Other | Attending: Emergency Medicine | Admitting: Emergency Medicine

## 2020-11-14 ENCOUNTER — Encounter (HOSPITAL_COMMUNITY): Payer: Self-pay | Admitting: Emergency Medicine

## 2020-11-14 ENCOUNTER — Other Ambulatory Visit: Payer: Self-pay

## 2020-11-14 DIAGNOSIS — R0600 Dyspnea, unspecified: Secondary | ICD-10-CM

## 2020-11-14 DIAGNOSIS — Z20822 Contact with and (suspected) exposure to covid-19: Secondary | ICD-10-CM | POA: Insufficient documentation

## 2020-11-14 DIAGNOSIS — R079 Chest pain, unspecified: Secondary | ICD-10-CM | POA: Diagnosis not present

## 2020-11-14 DIAGNOSIS — R0602 Shortness of breath: Secondary | ICD-10-CM | POA: Insufficient documentation

## 2020-11-14 DIAGNOSIS — I10 Essential (primary) hypertension: Secondary | ICD-10-CM | POA: Diagnosis not present

## 2020-11-14 LAB — CBC
HCT: 38.5 % (ref 36.0–46.0)
Hemoglobin: 11.7 g/dL — ABNORMAL LOW (ref 12.0–15.0)
MCH: 25.7 pg — ABNORMAL LOW (ref 26.0–34.0)
MCHC: 30.4 g/dL (ref 30.0–36.0)
MCV: 84.6 fL (ref 80.0–100.0)
Platelets: 248 10*3/uL (ref 150–400)
RBC: 4.55 MIL/uL (ref 3.87–5.11)
RDW: 14.3 % (ref 11.5–15.5)
WBC: 13.7 10*3/uL — ABNORMAL HIGH (ref 4.0–10.5)
nRBC: 0 % (ref 0.0–0.2)

## 2020-11-14 LAB — BASIC METABOLIC PANEL
Anion gap: 11 (ref 5–15)
BUN: 12 mg/dL (ref 6–20)
CO2: 24 mmol/L (ref 22–32)
Calcium: 8.4 mg/dL — ABNORMAL LOW (ref 8.9–10.3)
Chloride: 103 mmol/L (ref 98–111)
Creatinine, Ser: 0.75 mg/dL (ref 0.44–1.00)
GFR, Estimated: >60 mL/min (ref >60)
Glucose, Bld: 143 mg/dL — ABNORMAL HIGH (ref 70–99)
Potassium: 3.2 mmol/L — ABNORMAL LOW (ref 3.5–5.1)
Sodium: 138 mmol/L (ref 135–145)

## 2020-11-14 LAB — RESP PANEL BY RT-PCR (FLU A&B, COVID) ARPGX2
Influenza A by PCR: NEGATIVE
Influenza B by PCR: NEGATIVE
SARS Coronavirus 2 by RT PCR: NEGATIVE

## 2020-11-14 LAB — I-STAT BETA HCG BLOOD, ED (MC, WL, AP ONLY): I-stat hCG, quantitative: <5 m[IU]/mL (ref <5)

## 2020-11-14 LAB — TROPONIN I (HIGH SENSITIVITY): Troponin I (High Sensitivity): 3 ng/L (ref <18)

## 2020-11-14 LAB — D-DIMER, QUANTITATIVE: D-Dimer, Quant: 0.35 ug/mL-FEU (ref 0.00–0.50)

## 2020-11-14 IMAGING — CR DG CHEST 2V
2 series · 2 of 2 positions shown · non-contrast
Comparison: [DATE]

CLINICAL DATA: Shortness of breath and chest pain for several
hours.

EXAM:
CHEST - 2 VIEW

[chest lat]
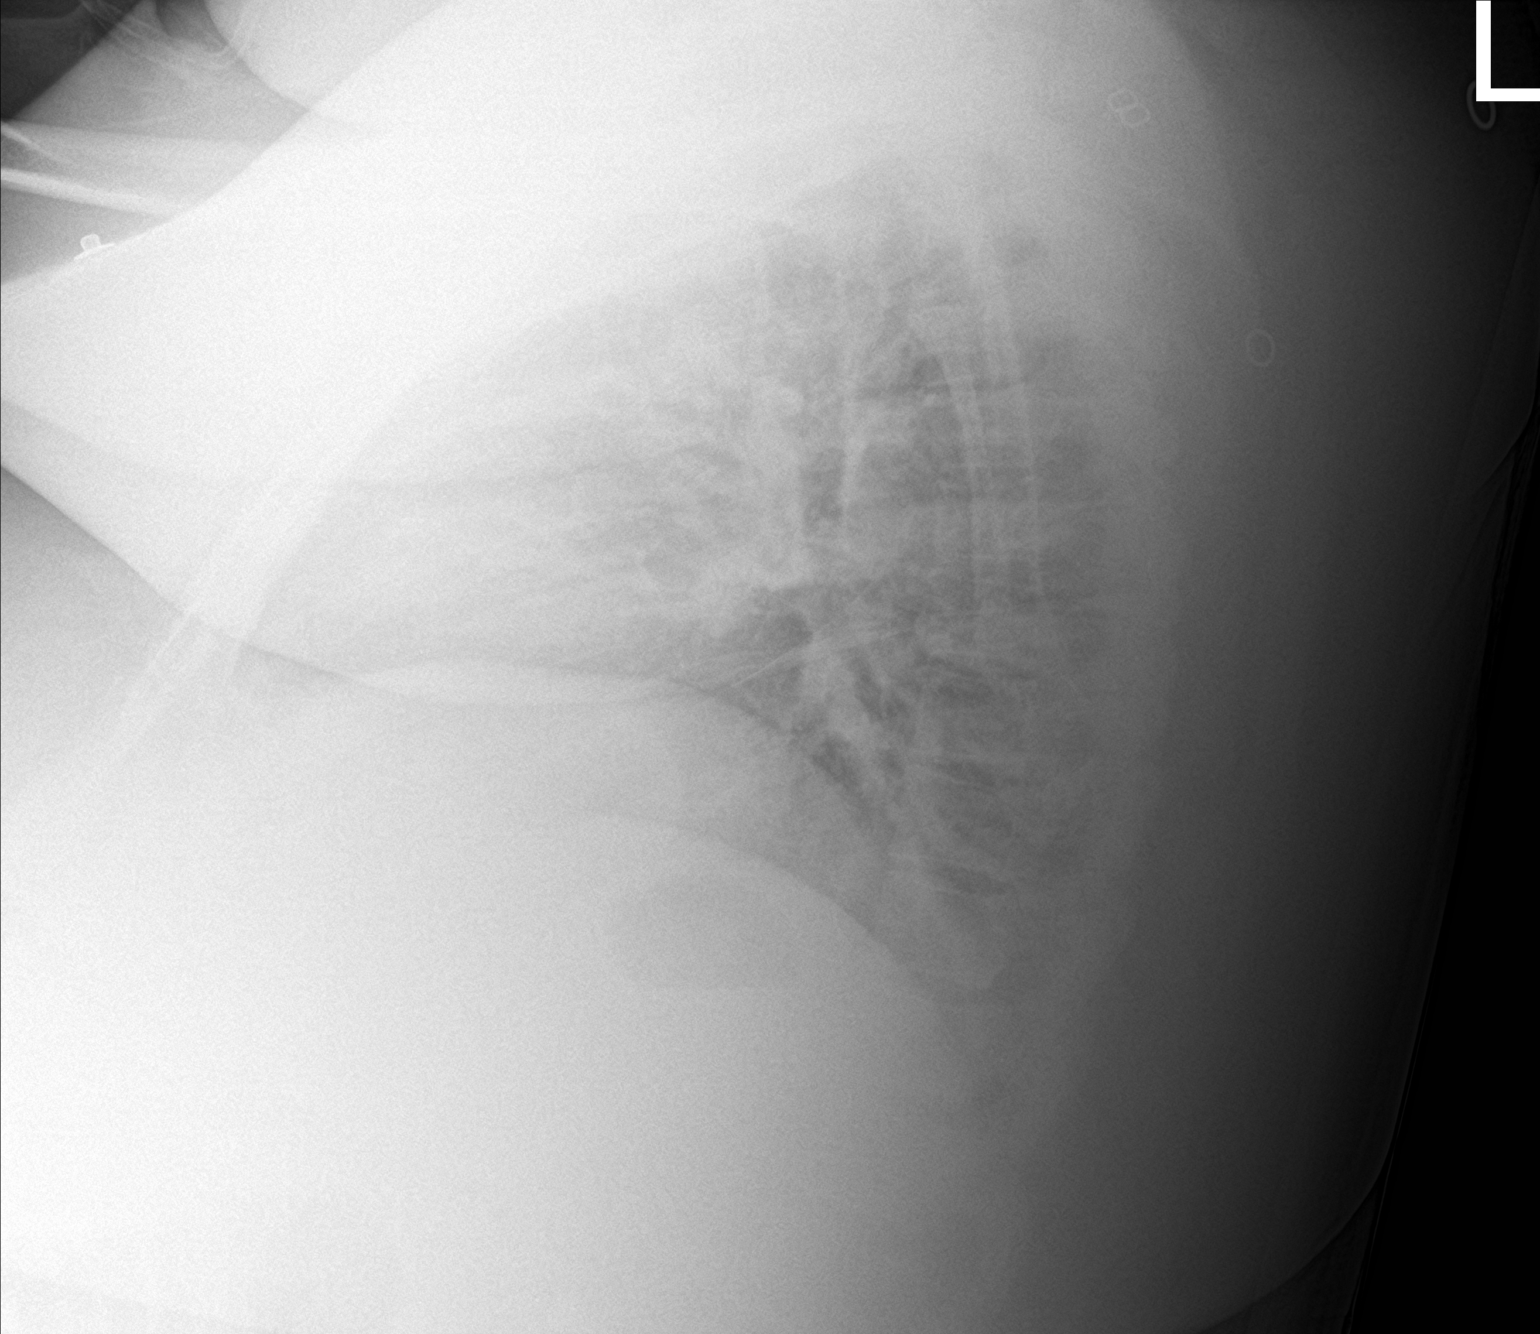

[chest ap]
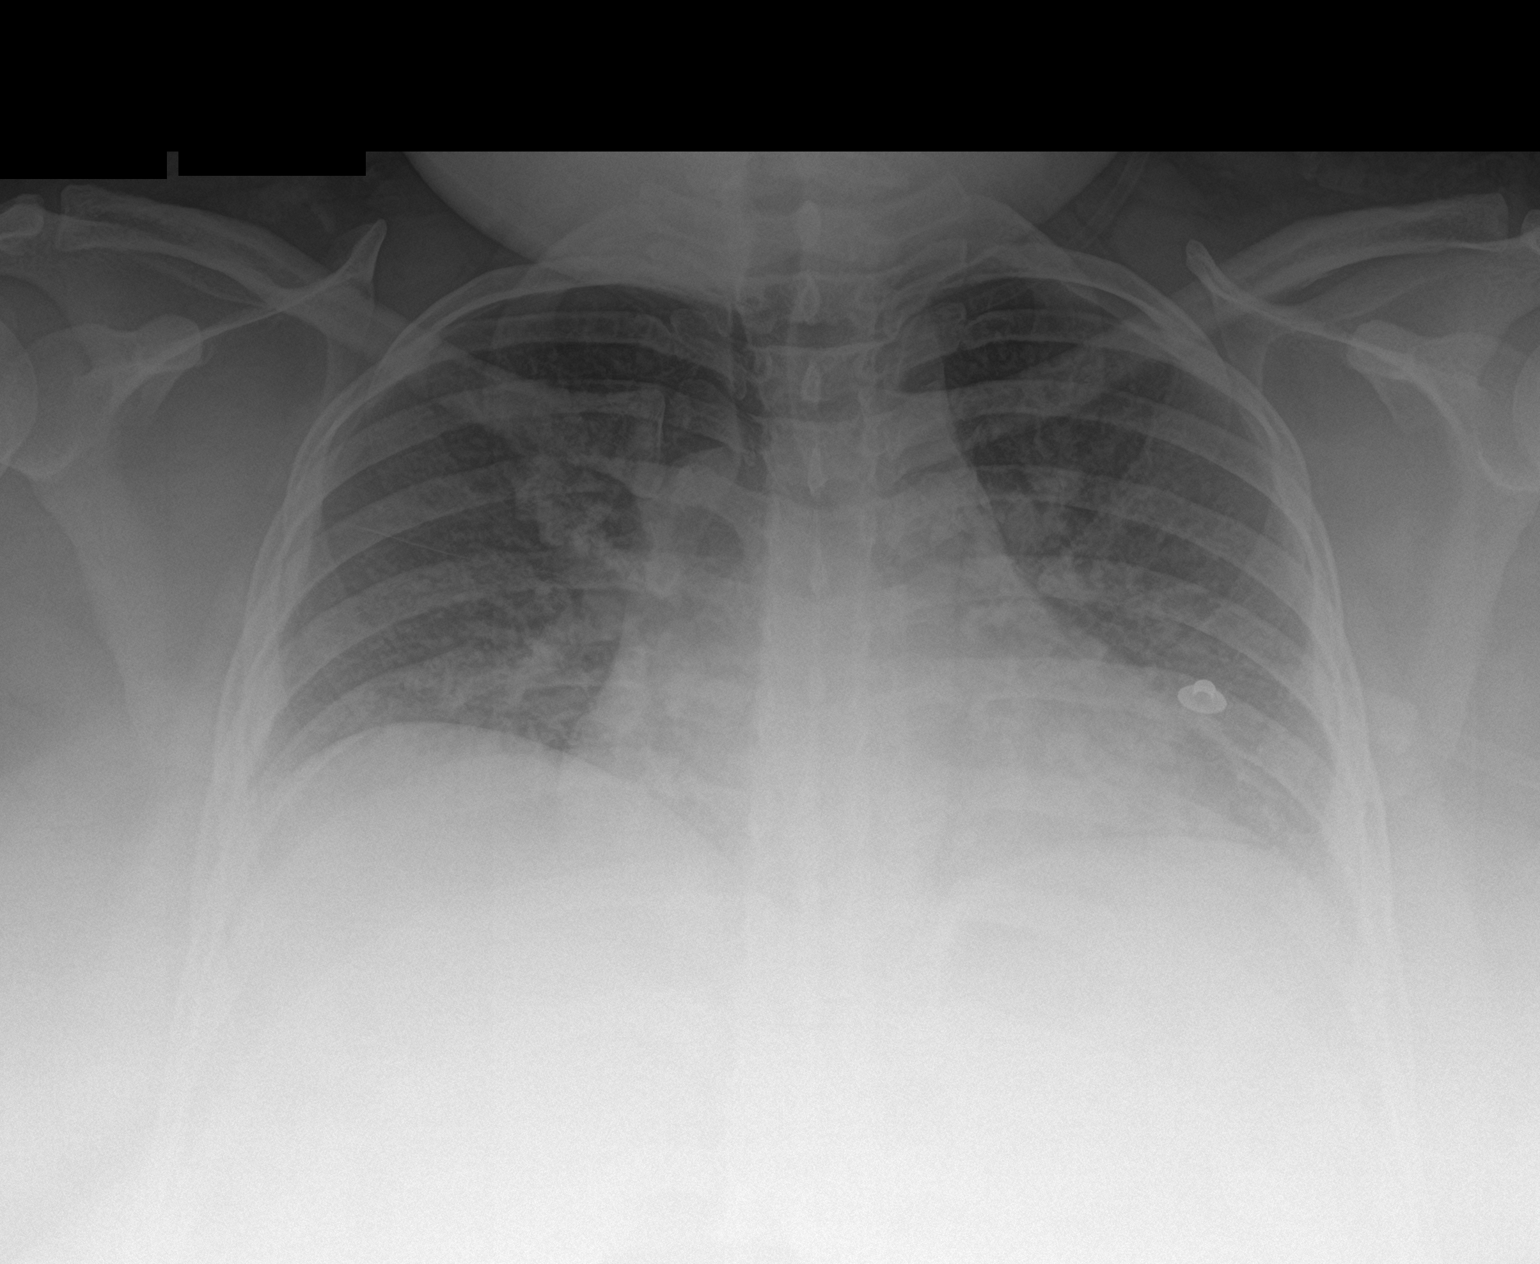

[2 of 2 positions shown; findings below may reference images not displayed]

FINDINGS: Very low lung volumes are seen. Heart size is within normal limits
allowing for low lung volumes. Both lungs are clear.
IMPRESSION: Very low lung volumes. No active disease.

## 2020-11-14 NOTE — Discharge Instructions (Addendum)
Your Covid test is still pending.  Isolate yourself until the results come back.  You do not have blood clots in your lungs and we do not see pneumonia on the x-ray.  Your heart does not appear to be the cause of this chest tightness.

## 2020-11-14 NOTE — ED Triage Notes (Signed)
Pt to triage via [facility].  Reports SOB and chest tightness since 8am. Denies nausea and vomiting.

## 2020-11-14 NOTE — ED Provider Notes (Signed)
[facility] EMERGENCY DEPARTMENT Provider Note   CSN: [id number] Arrival date & time: 11/14/20  3474     History Chief Complaint  Patient presents with  . Chest Pain    Alexandra Gray is a 29 y.o. female.  HPI Patient presents with shortness of breath and chest pain.  Woke with it this morning.  States that she feels as if she is short of breath and is having trouble doing things due to it.  Some mild chest tightness.  No nausea or vomiting.  No fevers.  No cough.  States she does not have a pulmonary history such as asthma.  No swelling in her legs.  She has had her Covid vaccine.  Chest pain is dull and in the mid chest.    Past Medical History:  Diagnosis Date  . Chlamydia   . Depression    doing ok now  . Gestational diabetes    Pt states she does not have DMII  . Gonorrhea   . History of pregnancy induced hypertension 12/06/2016  . Hx of trichomoniasis   . Hypertension    sometimes is up, never on meds  . Mental disorder   . Ovarian cyst   . PID (pelvic inflammatory disease)   . Sleep apnea     Patient Active Problem List   Diagnosis Date Noted  . Grief reaction 11/07/2020  . High risk medication use 11/07/2020  . H/O: C-section 01/04/2019  . Cesarean delivery delivered 01/04/2019  . GBS (group B Streptococcus carrier), +RV culture, currently pregnant 12/23/2018  . GDM, class A2 08/12/2018  . Chronic hypertension during pregnancy, antepartum 07/30/2018  . BMI 50.0-59.9, adult (HCC) 05/28/2018  . History of cesarean delivery affecting pregnancy 10/03/2015  . Supervision of high risk pregnancy, antepartum 08/23/2015  . Severe major depression without psychotic features (HCC) 02/18/2014  . 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  . PTSD (post-traumatic stress disorder) 02/16/2014  . Sleep apnea 02/16/2014  . MDD (major depressive disorder), recurrent severe, 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    Past Surgical History:   Procedure Laterality Date  . CESAREAN SECTION N/A 10/03/2015   Procedure: CESAREAN SECTION;  Surgeon: Tilda Burrow, MD;  Location: [facility];  Service: Obstetrics;  Laterality: N/A;  . CESAREAN SECTION N/A 01/04/2019   Procedure: CESAREAN SECTION;  Surgeon: Hermina Staggers, MD;  Location: [facility] BIRTHING SUITES;  Service: Obstetrics;  Laterality: N/A;  . KNEE SURGERY       OB History    Gravida  3   Para  2   Term  2   Preterm      AB      Living  2     SAB      IAB      Ectopic      Multiple  0   Live Births  2           Family History  Problem Relation Age of Onset  . Healthy Mother   . Healthy Father   . Healthy Brother   . Cancer Neg Hx   . Diabetes Neg Hx   . Heart disease Neg Hx   . Hypertension Neg Hx   . Stroke Neg Hx   . Hearing loss Neg Hx     Social History   Tobacco Use  . Smoking status: Never Smoker  . Smokeless tobacco: Never Used  Vaping Use  . Vaping Use: Never used  Substance Use Topics  . Alcohol  use: No  . Drug use: No    Home Medications Prior to Admission medications   Medication Sig Start Date End Date Taking? Authorizing Provider  cephALEXin (KEFLEX) 500 MG capsule Take 1 capsule (500 mg total) by mouth every 12 (twelve) hours. 11/08/20   Thermon Leyland, NP  divalproex (DEPAKOTE) 500 MG DR tablet Take 1 tablet (500 mg total) by mouth every 12 (twelve) hours. 11/08/20   Thermon Leyland, NP  fluticasone (FLONASE) 50 MCG/ACT nasal spray Place 1 spray into both nostrils daily. 11/09/20   Thermon Leyland, NP  melatonin 3 MG TABS tablet Take 1 tablet (3 mg total) by mouth at bedtime. 11/08/20   Thermon Leyland, NP  sertraline (ZOLOFT) 100 MG tablet Take 1 tablet (100 mg total) by mouth daily. 11/09/20   Thermon Leyland, NP    Allergies    Infuvite adult [multiple vitamin] and Nubain [nalbuphine hcl]  Review of Systems   Review of Systems  Constitutional: Negative for appetite change and fever.  HENT: Negative for congestion.    Respiratory: Positive for shortness of breath. Negative for cough.   Cardiovascular: Positive for chest pain. Negative for leg swelling.  Gastrointestinal: Negative for abdominal pain.  Genitourinary: Negative for flank pain.  Musculoskeletal: Negative for gait problem.  Skin: Negative for rash.  Neurological: Negative for weakness.  Psychiatric/Behavioral: Negative for confusion.    Physical Exam Updated Vital Signs BP 115/82   Pulse 78   Temp 97.6 F (36.4 C) (Oral)   Resp 20   LMP 10/24/2020 (Within Weeks)   SpO2 100%   Physical Exam Vitals and nursing note reviewed.  Constitutional:      Appearance: She is obese.  HENT:     Head: Normocephalic and atraumatic.  Cardiovascular:     Rate and Rhythm: Normal rate and regular rhythm.  Pulmonary:     Effort: No tachypnea.     Breath sounds: No stridor. No wheezing or rhonchi.  Chest:     Chest wall: No tenderness.  Abdominal:     Tenderness: There is no abdominal tenderness.  Musculoskeletal:     Comments: Mild edema bilateral lower extremities.  Skin:    General: Skin is warm.     Capillary Refill: Capillary refill takes less than 2 seconds.  Neurological:     Mental Status: She is alert and oriented to person, place, and time.     ED Results / Procedures / Treatments   Labs (all labs ordered are listed, but only abnormal results are displayed) Labs Reviewed  BASIC METABOLIC PANEL - Abnormal; Notable for the following components:      Result Value   Potassium 3.2 (*)    Glucose, Bld 143 (*)    Calcium 8.4 (*)    All other components within normal limits  CBC - Abnormal; Notable for the following components:   WBC 13.7 (*)    Hemoglobin 11.7 (*)    MCH 25.7 (*)    All other components within normal limits  RESP PANEL BY RT-PCR (FLU A&B, COVID) ARPGX2  D-DIMER, QUANTITATIVE (NOT AT [facility])  I-STAT BETA HCG BLOOD, ED ([facility], [facility], [facility] ONLY)  TROPONIN I (HIGH SENSITIVITY)    EKG EKG  Interpretation  Date/Time:  Sunday November 14 2020 09:50:04 EST Ventricular Rate:  79 PR Interval:  186 QRS Duration: 98 QT Interval:  434 QTC Calculation: 497 R Axis:   66 Text Interpretation: Normal sinus rhythm Cannot rule out Anterior infarct , age undetermined  Abnormal ECG No significant change since last tracing Confirmed by Benjiman Core [phone]) on 11/14/2020 2:29:05 PM   Radiology DG Chest 2 View  Result Date: 11/14/2020 CLINICAL DATA:  Shortness of breath and chest pain for several hours. EXAM: CHEST - 2 VIEW COMPARISON:  10/10/2017 FINDINGS: Very low lung volumes are seen. Heart size is within normal limits allowing for low lung volumes. Both lungs are clear. IMPRESSION: Very low lung volumes. No active disease. Electronically Signed   By: Danae Orleans M.D.   On: 11/14/2020 10:41    Procedures Procedures (including critical care time)  Medications Ordered in ED Medications - No data to display  ED Course  I have reviewed the triage vital signs and the nursing notes.  Pertinent labs & imaging results that were available during my care of the patient were reviewed by me and considered in my medical decision making (see chart for details).    MDM Rules/Calculators/A&P                          Patient with shortness of breath and chest pain.  EKG reassuring.  Troponin negative.  Chest x-ray reassuring.  However will get D-dimer.  Also will get Covid test.  Not hypoxic. Differential diagnosis initially included pulmonary embolism pneumonia pneumothorax coronary artery disease. Care will be turned over to oncoming provider. Final Clinical Impression(s) / ED Diagnoses Final diagnoses:  None    Rx / DC Orders ED Discharge Orders    None       Benjiman Core, MD 11/14/20 1451

## 2020-12-01 ENCOUNTER — Telehealth (HOSPITAL_COMMUNITY): Payer: Self-pay | Admitting: Internal Medicine

## 2020-12-01 NOTE — Telephone Encounter (Signed)
Care Management - Follow Up [facility] Discharges   Writer attempted to make contact with patient today and was unsuccessful.  Writer was able to leave a HIPPA compliant voice message and will await callback.  Per chart review patient was hospitalized at [facility]).

## 2020-12-07 ENCOUNTER — Other Ambulatory Visit: Payer: Self-pay

## 2020-12-07 ENCOUNTER — Emergency Department (HOSPITAL_COMMUNITY)
Admission: EM | Admit: 2020-12-07 | Discharge: 2020-12-07 | Disposition: A | Discharge status: Home / Self Care | Payer: Medicaid Other | Attending: Emergency Medicine | Admitting: Emergency Medicine

## 2020-12-07 DIAGNOSIS — Z5321 Procedure and treatment not carried out due to patient leaving prior to being seen by health care provider: Secondary | ICD-10-CM | POA: Insufficient documentation

## 2020-12-07 DIAGNOSIS — Z20822 Contact with and (suspected) exposure to covid-19: Secondary | ICD-10-CM | POA: Insufficient documentation

## 2020-12-07 DIAGNOSIS — R112 Nausea with vomiting, unspecified: Secondary | ICD-10-CM | POA: Insufficient documentation

## 2020-12-07 DIAGNOSIS — R1084 Generalized abdominal pain: Secondary | ICD-10-CM | POA: Diagnosis not present

## 2020-12-07 LAB — COMPREHENSIVE METABOLIC PANEL
ALT: 20 U/L (ref 0–44)
AST: 19 U/L (ref 15–41)
Albumin: 3.5 g/dL (ref 3.5–5.0)
Alkaline Phosphatase: 81 U/L (ref 38–126)
Anion gap: 13 (ref 5–15)
BUN: 6 mg/dL (ref 6–20)
CO2: 26 mmol/L (ref 22–32)
Calcium: 9.1 mg/dL (ref 8.9–10.3)
Chloride: 100 mmol/L (ref 98–111)
Creatinine, Ser: 0.67 mg/dL (ref 0.44–1.00)
GFR, Estimated: >60 mL/min (ref >60)
Glucose, Bld: 97 mg/dL (ref 70–99)
Potassium: 3.6 mmol/L (ref 3.5–5.1)
Sodium: 139 mmol/L (ref 135–145)
Total Bilirubin: 0.5 mg/dL (ref 0.3–1.2)
Total Protein: 7.4 g/dL (ref 6.5–8.1)

## 2020-12-07 LAB — LIPASE, BLOOD: Lipase: 18 U/L (ref 11–51)

## 2020-12-07 LAB — CBC
HCT: 43.3 % (ref 36.0–46.0)
Hemoglobin: 14.1 g/dL (ref 12.0–15.0)
MCH: 26.5 pg (ref 26.0–34.0)
MCHC: 32.6 g/dL (ref 30.0–36.0)
MCV: 81.4 fL (ref 80.0–100.0)
Platelets: 332 10*3/uL (ref 150–400)
RBC: 5.32 MIL/uL — ABNORMAL HIGH (ref 3.87–5.11)
RDW: 14.7 % (ref 11.5–15.5)
WBC: 11.5 10*3/uL — ABNORMAL HIGH (ref 4.0–10.5)
nRBC: 0 % (ref 0.0–0.2)

## 2020-12-07 LAB — SARS CORONAVIRUS 2 (TAT 6-24 HRS): SARS Coronavirus 2: NEGATIVE

## 2020-12-07 LAB — I-STAT BETA HCG BLOOD, ED (MC, WL, AP ONLY): I-stat hCG, quantitative: <5 m[IU]/mL (ref <5)

## 2020-12-07 MED ORDER — ONDANSETRON 4 MG PO TBDP
4.0000 mg | ORAL_TABLET | Freq: Once | ORAL | Status: AC | PRN
  Administered 2020-12-07: 4 mg via ORAL
  Filled 2020-12-07: qty 1

## 2020-12-07 NOTE — ED Triage Notes (Signed)
Pt with n/v and generalized abdominal pain since this morning. Denies diarrhea. Last PO intake was last night, spaghetti.

## 2021-01-06 ENCOUNTER — Telehealth: Payer: Medicaid Other | Admitting: Physician Assistant

## 2021-01-06 DIAGNOSIS — N923 Ovulation bleeding: Secondary | ICD-10-CM

## 2021-01-06 DIAGNOSIS — N920 Excessive and frequent menstruation with regular cycle: Secondary | ICD-10-CM

## 2021-01-06 NOTE — Progress Notes (Signed)
Hi Alexandra Gray,   I am sorry you aren't feeling well.  Unfortunately, this is too complex of a problem to take care of via E-visit.  I would feel more comfortable if you were seen in person for a physical exam and possible testing.    Based on what you shared with me, I feel your condition warrants further evaluation and I recommend that you be seen for a face to face visit.  Please contact your primary care physician practice to be seen. Many offices offer virtual options to be seen via video if you are not comfortable going in person to a medical facility at this time.  If you do not have a PCP,  offers a free physician referral service available at [phone]. Our trained staff has the experience, knowledge and resources to put you in touch with a physician who is right for you.   You also have the option of a video visit through [email or web address]  If you are having a true medical emergency please call 911.  NOTE: If you entered your credit card information for this eVisit, you will not be charged. You may see a "hold" on your card for the $35 but that hold will drop off and you will not have a charge processed.  Your e-visit answers were reviewed by a board certified advanced clinical practitioner to complete your personal care plan.  Thank you for using e-Visits.

## 2021-01-28 ENCOUNTER — Inpatient Hospital Stay (HOSPITAL_COMMUNITY): Admit: 2021-01-28 | Payer: Self-pay

## 2021-03-01 ENCOUNTER — Encounter: Payer: Self-pay | Admitting: Family

## 2021-03-05 ENCOUNTER — Telehealth: Payer: Medicaid Other | Admitting: Nurse Practitioner

## 2021-03-05 ENCOUNTER — Encounter: Payer: Self-pay | Admitting: Nurse Practitioner

## 2021-03-05 DIAGNOSIS — Z3A01 Less than 8 weeks gestation of pregnancy: Secondary | ICD-10-CM

## 2021-03-05 DIAGNOSIS — O26851 Spotting complicating pregnancy, first trimester: Secondary | ICD-10-CM | POA: Diagnosis not present

## 2021-03-05 NOTE — Progress Notes (Signed)
[name] are scheduled for a virtual visit with your provider today.    Just as we do with appointments in the office, we must obtain your consent to participate.  Your consent will be active for this visit and any virtual visit you may have with one of our providers in the next 365 days.    If you have a MyChart account, I can also send a copy of this consent to you electronically.  All virtual visits are billed to your insurance company just like a traditional visit in the office.  As this is a virtual visit, video technology does not allow for your provider to perform a traditional examination.  This may limit your provider's ability to fully assess your condition.  If your provider identifies any concerns that need to be evaluated in person or the need to arrange testing such as labs, EKG, etc, we will make arrangements to do so.    Although advances in technology are sophisticated, we cannot ensure that it will always work on either your end or our end.  If the connection with a video visit is poor, we may have to switch to a telephone visit.  With either a video or telephone visit, we are not always able to ensure that we have a secure connection.   I need to obtain your verbal consent now.   Are you willing to proceed with your visit today?   Alexandra Gray has provided verbal consent on 03/05/2021 for a virtual visit (video or telephone).   Mary-Margaret Daphine Deutscher, [facility] 03/05/2021  3:45 PM   Virtual Visit via video Note   Due to COVID-19 pandemic this visit was conducted virtually. This visit type was conducted due to national recommendations for restrictions regarding the COVID-19 Pandemic (e.g. social distancing, sheltering in place) in an effort to limit this patient's exposure and mitigate transmission in our community. All issues noted in this document were discussed and addressed.  A physical exam was not performed with this format.  I connected with  Alexandra Gray  on  03/05/21 at 3:40 by video and verified that I am speaking with the correct person using two identifiers. Alexandra Gray is currently located at home and no one is currently with  her during visit. The provider, Mary-Margaret Daphine Deutscher, FNP is located in their office at time of visit.  I discussed the limitations, risks, security and privacy concerns of performing an evaluation and management service by video  and the availability of in person appointments. I also discussed with the patient that there may be a patient responsible charge related to this service. The patient expressed understanding and agreed to proceed.   History and Present Illness:   Chief Complaint: Possible Pregnancy   HPI Patient states in video visit that she did a pregnancy test 3 day sago and their was a faint positive line. Since then she has ahd some spotting . Today when she went to void there was some blood in the toilet, but no clots. She denies any lower abdominal cramping. No nausea or vomiting.   Review of Systems  Constitutional: Negative for chills and fever.  HENT: Negative.   Respiratory: Negative.   Cardiovascular: Negative.   Gastrointestinal: Negative for abdominal pain, nausea and vomiting.  Genitourinary: Negative.   Musculoskeletal: Negative.   Neurological: Positive for headaches.  All other systems reviewed and are negative.      Observations/Objective: Alert and oriented- answers all questions appropriately No distress  Assessment and Plan: Alexandra Gray in today with chief complaint of Possible Pregnancy   1. Less than [redacted] weeks gestation of pregnancy  2. Spotting affecting pregnancy in first trimester  Need to go to the ED to be evaluated. Since currently not having cramping you can wait until Monday and call OB office.    Follow Up Instructions: prn    I discussed the assessment and treatment plan with the patient. The patient was provided an opportunity to ask  questions and all were answered. The patient agreed with the plan and demonstrated an understanding of the instructions.   The patient was advised to call back or seek an in-person evaluation if the symptoms worsen or if the condition fails to improve as anticipated.  The above assessment and management plan was discussed with the patient. The patient verbalized understanding of and has agreed to the management plan. Patient is aware to call the clinic if symptoms persist or worsen. Patient is aware when to return to the clinic for a follow-up visit. Patient educated on when it is appropriate to go to the emergency department.   Time call ended:3:53  I provided 13 minutes of face-to-face time during this encounter.    Mary-Margaret Daphine Deutscher, FNP

## 2021-03-07 ENCOUNTER — Encounter (HOSPITAL_COMMUNITY): Payer: Self-pay | Admitting: *Deleted

## 2021-03-07 ENCOUNTER — Ambulatory Visit (HOSPITAL_COMMUNITY)
Admission: AD | Admit: 2021-03-07 | Discharge: 2021-03-07 | Disposition: A | Discharge status: Home / Self Care | Payer: Medicaid Other | Attending: Family Medicine | Admitting: Family Medicine

## 2021-03-07 DIAGNOSIS — Z3202 Encounter for pregnancy test, result negative: Secondary | ICD-10-CM | POA: Diagnosis not present

## 2021-03-07 DIAGNOSIS — N939 Abnormal uterine and vaginal bleeding, unspecified: Secondary | ICD-10-CM | POA: Diagnosis present

## 2021-03-07 LAB — HCG, QUANTITATIVE, PREGNANCY: hCG, Beta Chain, Quant, S: <1 m[IU]/mL (ref <5)

## 2021-03-07 LAB — POCT PREGNANCY, URINE: Preg Test, Ur: NEGATIVE

## 2021-03-07 NOTE — Progress Notes (Signed)
Patient did not show for appointment.   

## 2021-03-07 NOTE — MAU Note (Signed)
Took 2-3 tests on Friday, were positive.  Started having a little bleeding over the weekend. Some pinkish spotting today. No pain.  Hx of miscarriage.

## 2021-03-07 NOTE — MAU Provider Note (Signed)
Event Date/Time   First Provider Initiated Contact with Patient 03/07/21 1529      S [name] is a 30 y.o. [phone] patient who presents to MAU today with complaint of vaginal bleeding & positive pregnancy test. Reports having 2 positive pregnancy tests on Friday. Reports some vaginal spotting since then. Denies abdominal pain.    O BP (!) 148/96 (BP Location: Right Arm)   Pulse 93   Temp 98.4 F (36.9 C) (Oral)   Resp 20   Ht 5' (1.524 m)   Wt (!) 144 kg   LMP 01/23/2021   SpO2 99%   BMI 61.99 kg/m  Physical Exam Vitals and nursing note reviewed.  Constitutional:      General: She is not in acute distress.    Appearance: Normal appearance. She is obese.  HENT:     Head: Normocephalic and atraumatic.  Pulmonary:     Effort: Pulmonary effort is normal. No respiratory distress.  Neurological:     Mental Status: She is alert.  Psychiatric:        Mood and Affect: Mood normal.        Behavior: Behavior normal.    Results for orders placed or performed during the hospital encounter of 03/07/21 (from the past 24 hour(s))  Pregnancy, urine POC     Status: None   Collection Time: 03/07/21  3:13 PM  Result Value Ref Range   Preg Test, Ur NEGATIVE NEGATIVE  hCG, quantitative, pregnancy     Status: None   Collection Time: 03/07/21  3:46 PM  Result Value Ref Range   hCG, Beta Chain, Quant, S <1 <5 mIU/mL    A Medical screening exam complete 1. Pregnancy examination or test, negative result      P Discharge from MAU in stable condition She has f/u with her PCP tomorrow  Judeth Horn, NP 03/07/2021 5:33 PM

## 2021-03-08 ENCOUNTER — Encounter: Payer: Medicaid Other | Admitting: Family

## 2021-03-08 DIAGNOSIS — Z7689 Persons encountering health services in other specified circumstances: Secondary | ICD-10-CM

## 2021-04-21 ENCOUNTER — Ambulatory Visit (INDEPENDENT_AMBULATORY_CARE_PROVIDER_SITE_OTHER): Payer: Medicaid Other | Admitting: Obstetrics

## 2021-04-21 ENCOUNTER — Encounter: Payer: Self-pay | Admitting: Obstetrics

## 2021-04-21 ENCOUNTER — Other Ambulatory Visit: Payer: Self-pay

## 2021-04-21 ENCOUNTER — Other Ambulatory Visit (HOSPITAL_COMMUNITY)
Admission: RE | Admit: 2021-04-21 | Discharge: 2021-04-21 | Disposition: A | Discharge status: Home / Self Care | Payer: Medicaid Other | Source: Ambulatory Visit | Attending: Obstetrics | Admitting: Obstetrics

## 2021-04-21 VITALS — BP 121/85 | HR 80 | Ht 60.0 in | Wt 315.9 lb

## 2021-04-21 DIAGNOSIS — Z0000 Encounter for general adult medical examination without abnormal findings: Secondary | ICD-10-CM

## 2021-04-21 DIAGNOSIS — Z01419 Encounter for gynecological examination (general) (routine) without abnormal findings: Secondary | ICD-10-CM

## 2021-04-21 DIAGNOSIS — N898 Other specified noninflammatory disorders of vagina: Secondary | ICD-10-CM | POA: Insufficient documentation

## 2021-04-21 DIAGNOSIS — Z6841 Body Mass Index (BMI) 40.0 and over, adult: Secondary | ICD-10-CM

## 2021-04-21 DIAGNOSIS — Z113 Encounter for screening for infections with a predominantly sexual mode of transmission: Secondary | ICD-10-CM | POA: Diagnosis not present

## 2021-04-21 DIAGNOSIS — Z3009 Encounter for other general counseling and advice on contraception: Secondary | ICD-10-CM

## 2021-04-21 NOTE — Progress Notes (Signed)
Subjective:        Alexandra Gray is a 30 y.o. female here for a routine exam.  Current complaints: Vaginal discharge .    Personal health questionnaire:  Is patient Ashkenazi Jewish, have a family history of breast and/or ovarian cancer: no Is there a family history of uterine cancer diagnosed at age < 60, gastrointestinal cancer, urinary tract cancer, family member who is a Personnel officer syndrome-associated carrier: no Is the patient overweight and hypertensive, family history of diabetes, personal history of gestational diabetes, preeclampsia or PCOS: no Is patient over 19, have PCOS,  family history of premature CHD under age 8, diabetes, smoke, have hypertension or peripheral artery disease:  no At any time, has a partner hit, kicked or otherwise hurt or frightened you?: no Over the past 2 weeks, have you felt down, depressed or hopeless?: no Over the past 2 weeks, have you felt little interest or pleasure in doing things?:no   Gynecologic History Patient's last menstrual period was 04/03/2021. Contraception: none Last Pap: unknown. Results were: unknown Last mammogram: n/a. Results were: n/a  Obstetric History OB History  Gravida Para Term Preterm AB Living  3 2 2   1 2   SAB IAB Ectopic Multiple Live Births        0 2    # Outcome Date GA Lbr Len/2nd Weight Sex Delivery Anes PTL Lv  3 AB 06/14/20 [redacted]w[redacted]d    SAB     2 Term 01/04/19 [redacted]w[redacted]d  7 lb 10.9 oz (3.484 kg) M CS-For Spinal  LIV  1 Term 10/03/15 [redacted]w[redacted]d  4 lb 0.6 oz (1.83 kg) F CS-LTranv Spinal  LIV    Past Medical History:  Diagnosis Date  . Chlamydia   . Depression    doing ok now  . Gestational diabetes    Pt states she does not have DMII  . Gonorrhea   . History of pregnancy induced hypertension 12/06/2016  . Hx of trichomoniasis   . Hypertension    sometimes is up, never on meds  . Mental disorder   . Ovarian cyst   . PID (pelvic inflammatory disease)   . Sleep apnea     Past Surgical History:   Procedure Laterality Date  . CESAREAN SECTION N/A 10/03/2015   Procedure: CESAREAN SECTION;  Surgeon: 13/04/2015, MD;  Location: [facility];  Service: Obstetrics;  Laterality: N/A;  . CESAREAN SECTION N/A 01/04/2019   Procedure: CESAREAN SECTION;  Surgeon: 03/05/2019, MD;  Location: [facility] BIRTHING SUITES;  Service: Obstetrics;  Laterality: N/A;  . CESAREAN SECTION N/A    [employer] 03/08/2021  . KNEE SURGERY       Current Outpatient Medications:  .  divalproex (DEPAKOTE) 500 MG DR tablet, Take 1 tablet (500 mg total) by mouth every 12 (twelve) hours., Disp: 60 tablet, Rfl: 0 .  melatonin 3 MG TABS tablet, Take 1 tablet (3 mg total) by mouth at bedtime., Disp: 30 tablet, Rfl: 0 .  sertraline (ZOLOFT) 100 MG tablet, Take 1 tablet (100 mg total) by mouth daily., Disp: 30 tablet, Rfl: 0 .  fluticasone (FLONASE) 50 MCG/ACT nasal spray, Place 1 spray into both nostrils daily. (Patient not taking: Reported on 04/21/2021), Disp: 11.1 mL, Rfl: 0 Allergies  Allergen Reactions  . Infuvite Adult [Multiple Vitamin] Shortness Of Breath  . Nubain [Nalbuphine Hcl] Other (See Comments)    Makes patient hot    Social History   Tobacco Use  . Smoking status: Never Smoker  .  Smokeless tobacco: Never Used  Substance Use Topics  . Alcohol use: No    Family History  Problem Relation Age of Onset  . Healthy Mother   . Healthy Father   . Healthy Brother   . Cancer Neg Hx   . Diabetes Neg Hx   . Heart disease Neg Hx   . Hypertension Neg Hx   . Stroke Neg Hx   . Hearing loss Neg Hx       Review of Systems  Constitutional: negative for fatigue and weight loss Respiratory: negative for cough and wheezing Cardiovascular: negative for chest pain, fatigue and palpitations Gastrointestinal: negative for abdominal pain and change in bowel habits Musculoskeletal:negative for myalgias Neurological: negative for gait problems and tremors Behavioral/Psych: negative for abusive relationship,  depression Endocrine: negative for temperature intolerance    Genitourinary: positive for vaginal discharge.  negative for abnormal menstrual periods, genital lesions, hot flashes, sexual problems  Integument/breast: negative for breast lump, breast tenderness, nipple discharge and skin lesion(s)    Objective:       BP 121/85   Pulse 80   Ht 5' (1.524 m)   Wt (!) 315 lb 14.4 oz (143.3 kg)   LMP 04/03/2021   BMI 61.69 kg/m  General:   alert and no distress  Skin:   no rash or abnormalities  Lungs:   clear to auscultation bilaterally  Heart:   regular rate and rhythm, S1, S2 normal, no murmur, click, rub or gallop  Breasts:   normal without suspicious masses, skin or nipple changes or axillary nodes  Abdomen:  normal findings: no organomegaly, soft, non-tender and no hernia  Pelvis:  External genitalia: normal general appearance Urinary system: urethral meatus normal and bladder without fullness, nontender Vaginal: normal without tenderness, induration or masses Cervix: normal appearance Adnexa: normal bimanual exam Uterus: anteverted and non-tender, normal size   Lab Review Urine pregnancy test Labs reviewed yes Radiologic studies reviewed no  I have spent a total of 20 minutes of face-to-face time, excluding clinical staff time, reviewing notes and preparing to see patient, ordering tests and/or medications, and counseling the patient.  Assessment:     1. Encounter for routine gynecological examination with Papanicolaou smear of cervix Rx: - Cytology - PAP( [place])  2. Vaginal discharge Rx: - Cervicovaginal ancillary only  3. Screening for STD (sexually transmitted disease) Rx: - Hepatitis B surface antigen - Hepatitis C antibody - HIV Antibody (routine testing w rflx) - RPR  4. Encounter for other general counseling or advice on contraception - declines.  Not sexually active  5. Class 3 severe obesity without serious comorbidity with body mass index (BMI)  of 60.0 to 69.9 in adult, unspecified obesity type (HCC) - program of caloric reduction, exercise and behavioral modification recommended     Plan:    Education reviewed: calcium supplements, depression evaluation, low fat, low cholesterol diet, safe sex/STD prevention, self breast exams and weight bearing exercise. Contraception: none. Follow up in: 1 year.    Orders Placed This Encounter  Procedures  . Hepatitis B surface antigen  . Hepatitis C antibody  . HIV Antibody (routine testing w rflx)  . RPR     Brock Bad, MD 04/21/2021 10:50 AM

## 2021-04-21 NOTE — Progress Notes (Signed)
Pt presents for annual, pap, and all STD testing. Pt reports "swollen area" under L breast

## 2021-04-22 LAB — CERVICOVAGINAL ANCILLARY ONLY
Bacterial Vaginitis (gardnerella): POSITIVE — AB
Candida Glabrata: NEGATIVE
Candida Vaginitis: POSITIVE — AB
Chlamydia: NEGATIVE
Comment: NEGATIVE
Comment: NEGATIVE
Comment: NEGATIVE
Comment: NEGATIVE
Comment: NEGATIVE
Comment: NORMAL
Neisseria Gonorrhea: POSITIVE — AB
Trichomonas: NEGATIVE

## 2021-04-22 LAB — HEPATITIS B SURFACE ANTIGEN: Hepatitis B Surface Ag: NEGATIVE

## 2021-04-22 LAB — RPR: RPR Ser Ql: NONREACTIVE

## 2021-04-22 LAB — HEPATITIS C ANTIBODY: Hep C Virus Ab: <0.1 s/co ratio (ref 0.0–0.9)

## 2021-04-22 LAB — HIV ANTIBODY (ROUTINE TESTING W REFLEX): HIV Screen 4th Generation wRfx: NONREACTIVE

## 2021-04-23 ENCOUNTER — Other Ambulatory Visit: Payer: Self-pay | Admitting: Obstetrics

## 2021-04-23 DIAGNOSIS — A549 Gonococcal infection, unspecified: Secondary | ICD-10-CM

## 2021-04-23 DIAGNOSIS — B379 Candidiasis, unspecified: Secondary | ICD-10-CM

## 2021-04-23 DIAGNOSIS — B9689 Other specified bacterial agents as the cause of diseases classified elsewhere: Secondary | ICD-10-CM

## 2021-04-23 MED ORDER — METRONIDAZOLE 500 MG PO TABS: 500.0000 mg | ORAL_TABLET | Freq: Two times a day (BID) | ORAL | 2 refills | Status: DC

## 2021-04-23 MED ORDER — FLUCONAZOLE 150 MG PO TABS: 150.0000 mg | ORAL_TABLET | Freq: Once | ORAL | 0 refills | Status: AC

## 2021-04-23 MED ORDER — CEFTRIAXONE SODIUM 500 MG IJ SOLR: 500.0000 mg | Freq: Once | INTRAMUSCULAR | Status: DC

## 2021-04-27 LAB — CYTOLOGY - PAP
Comment: NEGATIVE
Diagnosis: UNDETERMINED — AB
High risk HPV: NEGATIVE

## 2021-04-28 ENCOUNTER — Ambulatory Visit: Payer: Medicaid Other

## 2021-06-29 ENCOUNTER — Other Ambulatory Visit: Payer: Self-pay | Admitting: Internal Medicine

## 2021-06-30 LAB — LIPID PANEL
Cholesterol: 188 mg/dL (ref <200)
HDL: 43 mg/dL — ABNORMAL LOW (ref >=50)
LDL Cholesterol (Calc): 116 mg/dL (calc) — ABNORMAL HIGH
Non-HDL Cholesterol (Calc): 145 mg/dL (calc) — ABNORMAL HIGH (ref <130)
Total CHOL/HDL Ratio: 4.4 (calc) (ref <5.0)
Triglycerides: 177 mg/dL — ABNORMAL HIGH (ref <150)

## 2021-06-30 LAB — COMPLETE METABOLIC PANEL WITH GFR
AG Ratio: 1.2 (calc) (ref 1.0–2.5)
ALT: 14 U/L (ref 6–29)
AST: 14 U/L (ref 10–30)
Albumin: 4.1 g/dL (ref 3.6–5.1)
Alkaline phosphatase (APISO): 87 U/L (ref 31–125)
BUN: 9 mg/dL (ref 7–25)
CO2: 25 mmol/L (ref 20–32)
Calcium: 9.2 mg/dL (ref 8.6–10.2)
Chloride: 102 mmol/L (ref 98–110)
Creat: 0.6 mg/dL (ref 0.50–0.97)
Globulin: 3.4 g/dL (calc) (ref 1.9–3.7)
Glucose, Bld: 82 mg/dL (ref 65–99)
Potassium: 3.9 mmol/L (ref 3.5–5.3)
Sodium: 139 mmol/L (ref 135–146)
Total Bilirubin: 0.4 mg/dL (ref 0.2–1.2)
Total Protein: 7.5 g/dL (ref 6.1–8.1)
eGFR: 124 mL/min/{1.73_m2} (ref >=60)

## 2021-06-30 LAB — CBC
HCT: 44.7 % (ref 35.0–45.0)
Hemoglobin: 14.3 g/dL (ref 11.7–15.5)
MCH: 25.9 pg — ABNORMAL LOW (ref 27.0–33.0)
MCHC: 32 g/dL (ref 32.0–36.0)
MCV: 81 fL (ref 80.0–100.0)
MPV: 11.6 fL (ref 7.5–12.5)
Platelets: 341 10*3/uL (ref 140–400)
RBC: 5.52 10*6/uL — ABNORMAL HIGH (ref 3.80–5.10)
RDW: 14.6 % (ref 11.0–15.0)
WBC: 10.2 10*3/uL (ref 3.8–10.8)

## 2021-06-30 LAB — URINE CULTURE

## 2021-06-30 LAB — TSH: TSH: 2.6 mIU/L

## 2021-06-30 LAB — VITAMIN D 25 HYDROXY (VIT D DEFICIENCY, FRACTURES): Vit D, 25-Hydroxy: 22 ng/mL — ABNORMAL LOW (ref 30–100)

## 2021-07-14 ENCOUNTER — Telehealth: Payer: Medicaid Other | Admitting: Physician Assistant

## 2021-07-14 DIAGNOSIS — I10 Essential (primary) hypertension: Secondary | ICD-10-CM | POA: Diagnosis not present

## 2021-07-14 DIAGNOSIS — R519 Headache, unspecified: Secondary | ICD-10-CM | POA: Diagnosis not present

## 2021-07-14 DIAGNOSIS — G8929 Other chronic pain: Secondary | ICD-10-CM | POA: Diagnosis not present

## 2021-07-14 DIAGNOSIS — R0789 Other chest pain: Secondary | ICD-10-CM | POA: Diagnosis not present

## 2021-07-14 NOTE — Patient Instructions (Signed)
  Alexandra Gray, thank you for joining Piedad Climes, PA-C for today's virtual visit.  While this provider is not your primary care provider (PCP), if your PCP is located in our provider database this encounter information will be shared with them immediately following your visit.  Consent: (Patient) Alexandra Gray provided verbal consent for this virtual visit at the beginning of the encounter.  Current Medications:  Current Outpatient Medications:    divalproex (DEPAKOTE) 500 MG DR tablet, Take 1 tablet (500 mg total) by mouth every 12 (twelve) hours., Disp: 60 tablet, Rfl: 0   fluticasone (FLONASE) 50 MCG/ACT nasal spray, Place 1 spray into both nostrils daily. (Patient not taking: Reported on 04/21/2021), Disp: 11.1 mL, Rfl: 0   melatonin 3 MG TABS tablet, Take 1 tablet (3 mg total) by mouth at bedtime., Disp: 30 tablet, Rfl: 0   metroNIDAZOLE (FLAGYL) 500 MG tablet, Take 1 tablet (500 mg total) by mouth 2 (two) times daily., Disp: 14 tablet, Rfl: 2   sertraline (ZOLOFT) 100 MG tablet, Take 1 tablet (100 mg total) by mouth daily., Disp: 30 tablet, Rfl: 0  Current Facility-Administered Medications:    cefTRIAXone (ROCEPHIN) injection 500 mg, 500 mg, Intramuscular, Once, Brock Bad, MD   Medications ordered in this encounter:  No orders of the defined types were placed in this encounter.    *If you need refills on other medications prior to your next appointment, please contact your pharmacy*  Follow-Up: Call back or seek an in-person evaluation if the symptoms worsen or if the condition fails to improve as anticipated.  Other Instructions Please stop the HCTZ -- do not take this any more. You need to make your primary care provider aware of your reaction to this medicine.  As discussed, use the link below to get scheduled at one of our Urgent Cares today for further evaluation and management of headache, BP and to get EKG giving your intermittent chest pain.     If you have been instructed to have an in-person evaluation today at a local Urgent Care facility, please use the link below. It will take you to a list of all of our available [facility], including address, phone number and hours of operation. Please do not delay care.  [facility]  If you or a family member do not have a primary care provider, use the link below to schedule a visit and establish care. When you choose a [facility] primary care physician or advanced practice provider, you gain a long-term partner in health. Find a Primary Care Provider  Learn more about [facility]'s in-office and virtual care options: [facility] - Get Care Now

## 2021-07-14 NOTE — Progress Notes (Signed)
Virtual Visit Consent   Alexandra Gray, you are scheduled for a virtual visit with a [facility] provider today.     Just as with appointments in the office, your consent must be obtained to participate.  Your consent will be active for this visit and any virtual visit you may have with one of our providers in the next 365 days.     If you have a MyChart account, a copy of this consent can be sent to you electronically.  All virtual visits are billed to your insurance company just like a traditional visit in the office.    As this is a virtual visit, video technology does not allow for your provider to perform a traditional examination.  This may limit your provider's ability to fully assess your condition.  If your provider identifies any concerns that need to be evaluated in person or the need to arrange testing (such as labs, EKG, etc.), we will make arrangements to do so.     Although advances in technology are sophisticated, we cannot ensure that it will always work on either your end or our end.  If the connection with a video visit is poor, the visit may have to be switched to a telephone visit.  With either a video or telephone visit, we are not always able to ensure that we have a secure connection.     I need to obtain your verbal consent now.   Are you willing to proceed with your visit today?    Alexandra Gray has provided verbal consent on 07/14/2021 for a virtual visit (video or telephone).   Piedad Climes, [place]   Date: 07/14/2021 9:49 AM   Virtual Visit via Video Note   I, Piedad Climes, connected with  Alexandra Gray  ([id number], 09/30/1991) on 07/14/21 at  9:30 AM EDT by a video-enabled telemedicine application and verified that I am speaking with the correct person using two identifiers.  Location: Patient: Virtual Visit Location Patient: Home Provider: Virtual Visit Location Provider: Home Office   I discussed the limitations of evaluation and  management by telemedicine and the availability of in person appointments. The patient expressed understanding and agreed to proceed.    History of Present Illness: Alexandra Gray is a 30 y.o. who identifies as a female who was assigned female at birth, and is being seen today for ongoing issue with elevated BP and headaches. Notes she has been seen by her primary care provider for this and was just placed on HCTZ. States she took her first dose yesterday and broke out in hives everywhere. As such she stopped medication with resolution of these symptoms. Is still having a headache that is mostly frontal but sometimes occipital. Not associated with photophobia or phonophobia, tension, etc. BP this morning was 140/80. She has noted some ongoing issue with intermittent chest pain, none currently. States she has mentioned this to her PCP but they do not seem concerned. She is not sure what to do. Has also noted intermittent bouts of facial swelling and tenderness which she was told was related to allergies. Denies any sinus pain, tooth pain, ear pain, nasal congestion.  HPI: HPI  Problems:  Patient Active Problem List   Diagnosis Date Noted   Grief reaction 11/07/2020   High risk medication use 11/07/2020   H/O: C-section 01/04/2019   Cesarean delivery delivered 01/04/2019   GBS (group B Streptococcus carrier), +RV culture, currently pregnant 12/23/2018   GDM, class  A2 08/12/2018   Chronic hypertension during pregnancy, antepartum 07/30/2018   BMI 50.0-59.9, adult (HCC) 05/28/2018   History of cesarean delivery affecting pregnancy 10/03/2015   Supervision of high risk pregnancy, antepartum 08/23/2015   Severe major depression without psychotic features (HCC) 02/18/2014   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   PTSD (post-traumatic stress disorder) 02/16/2014   Sleep apnea 02/16/2014   MDD (major depressive disorder), recurrent severe, 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     Allergies:  Allergies  Allergen Reactions   Infuvite Adult [Multiple Vitamin] Shortness Of Breath   Nubain [Nalbuphine Hcl] Other (See Comments)    Makes patient hot   Medications:  Current Outpatient Medications:    divalproex (DEPAKOTE) 500 MG DR tablet, Take 1 tablet (500 mg total) by mouth every 12 (twelve) hours., Disp: 60 tablet, Rfl: 0   hydrochlorothiazide (HYDRODIURIL) 12.5 MG tablet, Take 12.5 mg by mouth daily., Disp: , Rfl:    melatonin 3 MG TABS tablet, Take 1 tablet (3 mg total) by mouth at bedtime., Disp: 30 tablet, Rfl: 0   sertraline (ZOLOFT) 100 MG tablet, Take 1 tablet (100 mg total) by mouth daily., Disp: 30 tablet, Rfl: 0   Vitamin D, Ergocalciferol, (DRISDOL) 1.25 MG (50000 UNIT) CAPS capsule, Take 50,000 Units by mouth once a week., Disp: , Rfl:   Observations/Objective: Patient is well-developed, well-nourished in no acute distress.  Resting comfortably at home.  Head is normocephalic, atraumatic.  No labored breathing. Speech is clear and coherent with logical content.  Patient is alert and oriented at baseline.   Assessment and Plan: 1. Hypertension, unspecified type  2. Chronic nonintractable headache, unspecified headache type  3. Atypical chest pain  Multiple ongoing symptoms including some that are thankfully intermittent and not present now but still concerning. She definitely needs a further evaluation than what can be provided via virtual urgent care visit. Recommend she not take any more of the HCTZ. She needs to make her PCP aware. Giving current severe headache w/o alarm signs/symptoms  will send her for evaluation at nearest [facility] first for assessment so she can get examination, EKG due to her intermittent chest pain and hopefully medication to abort headache. Discussed will need ER evaluation for any worsening symptoms prior to UC evaluation.   Follow Up Instructions: I discussed the assessment and treatment plan with the patient.  The patient was provided an opportunity to ask questions and all were answered. The patient agreed with the plan and demonstrated an understanding of the instructions.  A copy of instructions were sent to the patient via MyChart.  The patient was advised to call back or seek an in-person evaluation if the symptoms worsen or if the condition fails to improve as anticipated.  Time:  I spent 12 minutes with the patient via telehealth technology discussing the above problems/concerns.    Piedad Climes, PA-C

## 2021-09-21 ENCOUNTER — Emergency Department (HOSPITAL_COMMUNITY)
Admission: EM | Admit: 2021-09-21 | Discharge: 2021-09-22 | Disposition: A | Discharge status: Home / Self Care | Payer: Medicaid Other | Attending: Emergency Medicine | Admitting: Emergency Medicine

## 2021-09-21 ENCOUNTER — Emergency Department (HOSPITAL_COMMUNITY): Payer: Medicaid Other

## 2021-09-21 DIAGNOSIS — N9489 Other specified conditions associated with female genital organs and menstrual cycle: Secondary | ICD-10-CM | POA: Diagnosis not present

## 2021-09-21 DIAGNOSIS — Z20822 Contact with and (suspected) exposure to covid-19: Secondary | ICD-10-CM | POA: Insufficient documentation

## 2021-09-21 DIAGNOSIS — F12121 Cannabis abuse with intoxication delirium: Secondary | ICD-10-CM | POA: Insufficient documentation

## 2021-09-21 DIAGNOSIS — I10 Essential (primary) hypertension: Secondary | ICD-10-CM | POA: Insufficient documentation

## 2021-09-21 DIAGNOSIS — F12921 Cannabis use, unspecified with intoxication delirium: Secondary | ICD-10-CM

## 2021-09-21 DIAGNOSIS — Z79899 Other long term (current) drug therapy: Secondary | ICD-10-CM | POA: Diagnosis not present

## 2021-09-21 DIAGNOSIS — R4182 Altered mental status, unspecified: Secondary | ICD-10-CM | POA: Diagnosis present

## 2021-09-21 LAB — ETHANOL: Alcohol, Ethyl (B): <10 mg/dL (ref <10)

## 2021-09-21 LAB — COMPREHENSIVE METABOLIC PANEL
ALT: 17 U/L (ref 0–44)
AST: 17 U/L (ref 15–41)
Albumin: 4 g/dL (ref 3.5–5.0)
Alkaline Phosphatase: 83 U/L (ref 38–126)
Anion gap: 7 (ref 5–15)
BUN: 14 mg/dL (ref 6–20)
CO2: 27 mmol/L (ref 22–32)
Calcium: 8.7 mg/dL — ABNORMAL LOW (ref 8.9–10.3)
Chloride: 104 mmol/L (ref 98–111)
Creatinine, Ser: 0.49 mg/dL (ref 0.44–1.00)
GFR, Estimated: >60 mL/min (ref >60)
Glucose, Bld: 113 mg/dL — ABNORMAL HIGH (ref 70–99)
Potassium: 3.7 mmol/L (ref 3.5–5.1)
Sodium: 138 mmol/L (ref 135–145)
Total Bilirubin: 0.2 mg/dL — ABNORMAL LOW (ref 0.3–1.2)
Total Protein: 8 g/dL (ref 6.5–8.1)

## 2021-09-21 LAB — URINALYSIS, ROUTINE W REFLEX MICROSCOPIC
Bilirubin Urine: NEGATIVE
Glucose, UA: NEGATIVE mg/dL
Hgb urine dipstick: NEGATIVE
Ketones, ur: NEGATIVE mg/dL
Leukocytes,Ua: NEGATIVE
Nitrite: NEGATIVE
Protein, ur: NEGATIVE mg/dL
Specific Gravity, Urine: 1.021 (ref 1.005–1.030)
pH: 6 (ref 5.0–8.0)

## 2021-09-21 LAB — CBC WITH DIFFERENTIAL/PLATELET
Abs Immature Granulocytes: 0.05 10*3/uL (ref 0.00–0.07)
Basophils Absolute: 0 10*3/uL (ref 0.0–0.1)
Basophils Relative: 0 %
Eosinophils Absolute: 0.2 10*3/uL (ref 0.0–0.5)
Eosinophils Relative: 2 %
HCT: 40.5 % (ref 36.0–46.0)
Hemoglobin: 12.9 g/dL (ref 12.0–15.0)
Immature Granulocytes: 1 %
Lymphocytes Relative: 30 %
Lymphs Abs: 3.2 10*3/uL (ref 0.7–4.0)
MCH: 26.5 pg (ref 26.0–34.0)
MCHC: 31.9 g/dL (ref 30.0–36.0)
MCV: 83.3 fL (ref 80.0–100.0)
Monocytes Absolute: 0.6 10*3/uL (ref 0.1–1.0)
Monocytes Relative: 6 %
Neutro Abs: 6.7 10*3/uL (ref 1.7–7.7)
Neutrophils Relative %: 61 %
Platelets: 297 10*3/uL (ref 150–400)
RBC: 4.86 MIL/uL (ref 3.87–5.11)
RDW: 14.1 % (ref 11.5–15.5)
WBC: 10.8 10*3/uL — ABNORMAL HIGH (ref 4.0–10.5)
nRBC: 0 % (ref 0.0–0.2)

## 2021-09-21 LAB — RESP PANEL BY RT-PCR (FLU A&B, COVID) ARPGX2
Influenza A by PCR: NEGATIVE
Influenza B by PCR: NEGATIVE
SARS Coronavirus 2 by RT PCR: NEGATIVE

## 2021-09-21 LAB — RAPID URINE DRUG SCREEN, HOSP PERFORMED
Amphetamines: NOT DETECTED
Barbiturates: NOT DETECTED
Benzodiazepines: NOT DETECTED
Cocaine: NOT DETECTED
Opiates: NOT DETECTED
Tetrahydrocannabinol: POSITIVE — AB

## 2021-09-21 LAB — ACETAMINOPHEN LEVEL: Acetaminophen (Tylenol), Serum: <10 ug/mL — ABNORMAL LOW (ref 10–30)

## 2021-09-21 LAB — SALICYLATE LEVEL: Salicylate Lvl: <7 mg/dL — ABNORMAL LOW (ref 7.0–30.0)

## 2021-09-21 LAB — I-STAT BETA HCG BLOOD, ED (MC, WL, AP ONLY): I-stat hCG, quantitative: <5 m[IU]/mL (ref <5)

## 2021-09-21 IMAGING — DX DG CHEST 1V PORT
1 series · 1 of 1 positions shown · non-contrast
Comparison: [DATE]

CLINICAL DATA: Altered mental status.

EXAM:
PORTABLE CHEST 1 VIEW

[chest ap]
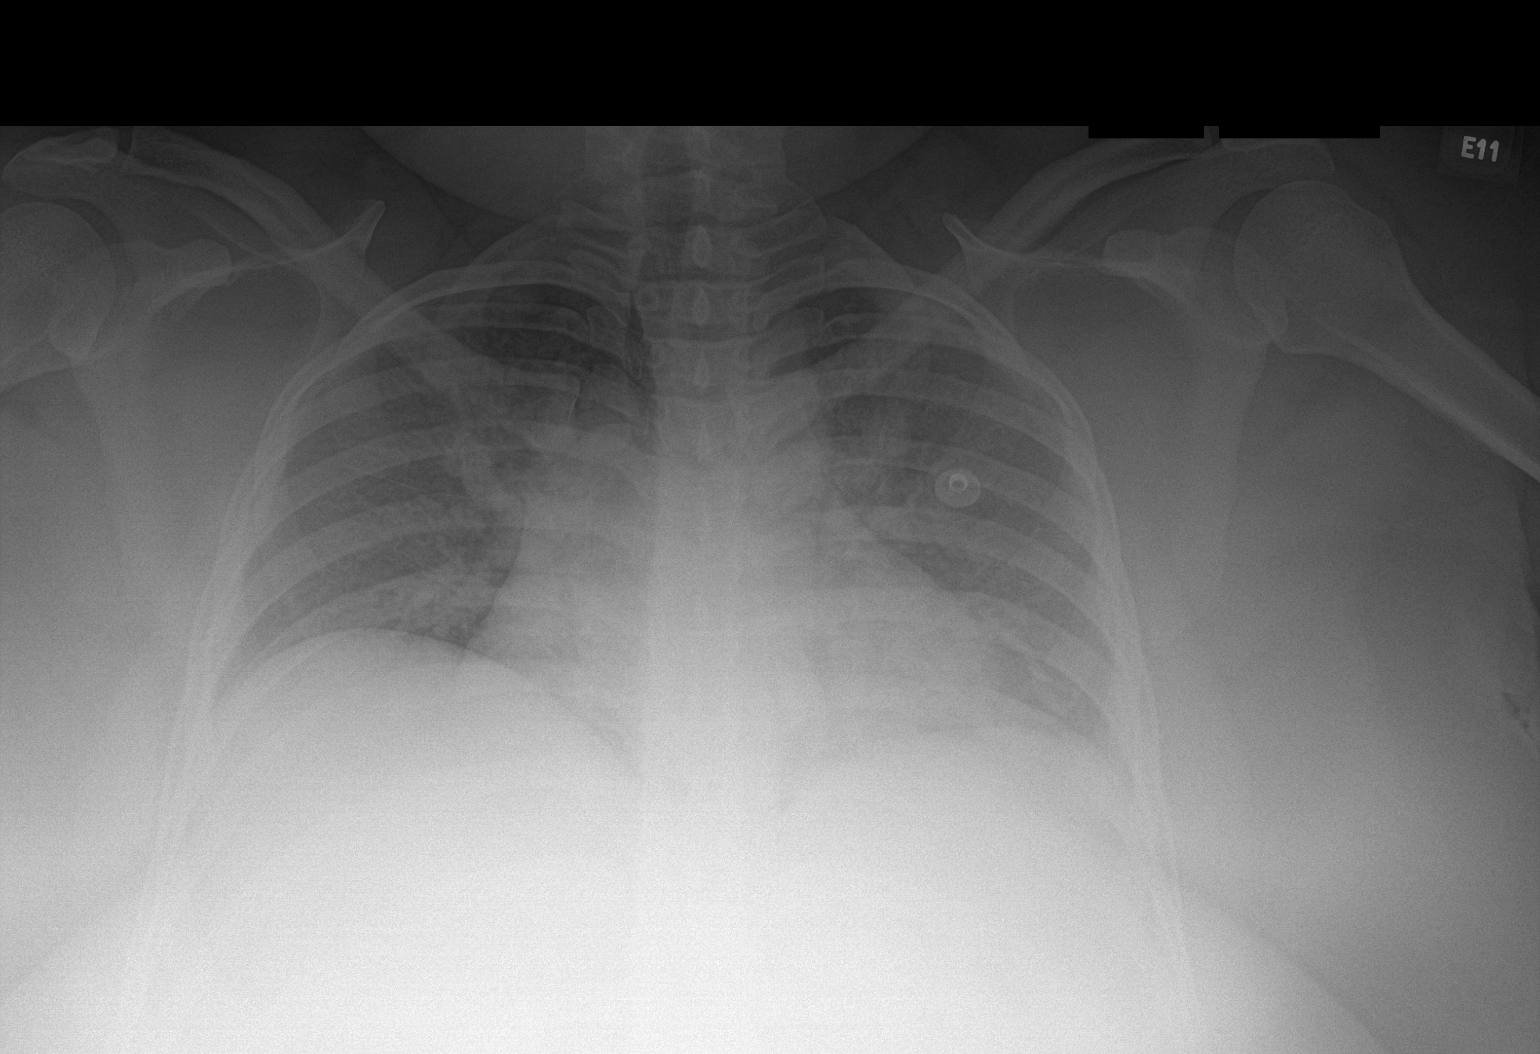

[1 of 1 positions shown; findings below may reference images not displayed]

FINDINGS: The heart size and mediastinal contours are within normal limits.
Low lung volumes are noted. Both lungs are clear. The visualized
skeletal structures are unremarkable.
IMPRESSION: No active disease.

## 2021-09-21 MED ORDER — SODIUM CHLORIDE 0.9 % IV SOLN: INTRAVENOUS | Status: DC

## 2021-09-21 MED ORDER — SODIUM CHLORIDE 0.9 % IV BOLUS
1000.0000 mL | Freq: Once | INTRAVENOUS | Status: AC
  Administered 2021-09-21: 1000 mL via INTRAVENOUS

## 2021-09-21 NOTE — ED Provider Notes (Signed)
Patient care assumed at 2300.  Pt here with intoxication after consuming edibles.  Labs re-assuring.  Metabolization and re-assesment pending.   Patient slept through the night and in the morning patient is awake and conversant. She states that she consumed edibles. There was no attempt to harm herself. She is not hallucinating at this time. Plan to discharge home in care of mother with outpatient follow-up and return precautions.   Tilden Fossa, MD 09/22/21 [phone]

## 2021-09-21 NOTE — ED Provider Notes (Signed)
[facility]-EMERGENCY DEPT Provider Note   CSN: [id number] Arrival date & time: 09/21/21  2025     History Chief Complaint  Patient presents with   Ingestion    Alexandra Gray is a 30 y.o. female.  Pt presents to the ED today with AMS.  [name] family member called EMS because they could not get pt to calm down.  When EMS arrived, pt was sitting on the couch hyperventilating.  She was able to slow down her breathing by listening to the paramedic.  Family told EMS that pt took multiple edibles.  Pt is unable to give any hx.      Past Medical History:  Diagnosis Date   Chlamydia    Depression    doing ok now   Gestational diabetes    Pt states she does not have DMII   Gonorrhea    History of pregnancy induced hypertension 12/06/2016   Hx of trichomoniasis    Hypertension    sometimes is up, never on meds   Mental disorder    Ovarian cyst    PID (pelvic inflammatory disease)    Sleep apnea     Patient Active Problem List   Diagnosis Date Noted   Grief reaction 11/07/2020   High risk medication use 11/07/2020   H/O: C-section 01/04/2019   Cesarean delivery delivered 01/04/2019   GBS (group B Streptococcus carrier), +RV culture, currently pregnant 12/23/2018   GDM, class A2 08/12/2018   Chronic hypertension during pregnancy, antepartum 07/30/2018   BMI 50.0-59.9, adult (HCC) 05/28/2018   History of cesarean delivery affecting pregnancy 10/03/2015   Supervision of high risk pregnancy, antepartum 08/23/2015   Severe major depression without psychotic features (HCC) 02/18/2014   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   PTSD (post-traumatic stress disorder) 02/16/2014   Sleep apnea 02/16/2014   MDD (major depressive disorder), recurrent severe, 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    Past Surgical History:  Procedure Laterality Date   CESAREAN SECTION N/A 10/03/2015   Procedure: CESAREAN SECTION;  Surgeon: Tilda Burrow, MD;  Location:  [facility] ORS;  Service: Obstetrics;  Laterality: N/A;   CESAREAN SECTION N/A 01/04/2019   Procedure: CESAREAN SECTION;  Surgeon: Hermina Staggers, MD;  Location: [facility]IR[facility]ING SU[facility];  Service: Obstetrics;  Laterality: N/A;   CESAREAN SECTION N/A    Phreesia 03/08/2021   KNEE SURGERY       OB History     Gravida  3   Para  2   Term  2   Preterm      AB  1   Living  2      SAB      IAB      Ectopic      Multiple  0   Live Births  2           Family History  Problem Relation Age of Onset   Healthy Mother    Healthy Father    Healthy Brother    Cancer Neg Hx    Diabetes Neg Hx    Heart disease Neg Hx    Hypertension Neg Hx    Stroke Neg Hx    Hearing loss Neg Hx     Social History   Tobacco Use   Smoking status: Never   Smokeless tobacco: Never  Vaping Use   Vaping Use: Never used  Substance Use Topics   Alcohol use: No   Drug use: No    Home Medications Prior to  Admission medications   Medication Sig Start Date End Date Taking? Authorizing Provider  divalproex (DEPAKOTE) 500 MG DR tablet Take 1 tablet (500 mg total) by mouth every 12 (twelve) hours. 11/08/20   Thermon Leyland, NP  hydrochlorothiazide (HYDRODIURIL) 12.5 MG tablet Take 12.5 mg by mouth daily. 07/07/21   [provider]  melatonin 3 MG TABS tablet Take 1 tablet (3 mg total) by mouth at bedtime. 11/08/20   Thermon Leyland, NP  sertraline (ZOLOFT) 100 MG tablet Take 1 tablet (100 mg total) by mouth daily. 11/09/20   Thermon Leyland, NP  Vitamin D, Ergocalciferol, (DRISDOL) 1.25 MG (50000 UNIT) CAPS capsule Take 50,000 Units by mouth once a week. 07/01/21   [provider]    Allergies    Infuvite adult [multiple vitamin] and Nubain [nalbuphine hcl]  Review of Systems   Review of Systems  Unable to perform ROS: Mental status change  All other systems reviewed and are negative.  Physical Exam Updated Vital Signs BP (!) 166/105   Pulse (!) 106   Temp 98.9 F (37.2 C)  (Axillary)   Resp (!) 22   Ht 5' (1.524 m)   Wt (!) 143.3 kg   LMP  (LMP Unknown)   SpO2 100%   BMI 61.70 kg/m   Physical Exam Vitals and nursing note reviewed.  Constitutional:      Appearance: She is obese.  HENT:     Head: Normocephalic and atraumatic.     Right Ear: External ear normal.     Left Ear: External ear normal.     Nose: Nose normal.     Mouth/Throat:     Mouth: Mucous membranes are moist.     Pharynx: Oropharynx is clear.  Eyes:     Conjunctiva/sclera: Conjunctivae normal.     Comments: Pupils dilated  Cardiovascular:     Rate and Rhythm: Regular rhythm. Tachycardia present.     Pulses: Normal pulses.     Heart sounds: Normal heart sounds.  Pulmonary:     Effort: Pulmonary effort is normal.     Breath sounds: Normal breath sounds.  Abdominal:     General: Abdomen is flat. Bowel sounds are normal.     Palpations: Abdomen is soft.  Skin:    General: Skin is warm.     Capillary Refill: Capillary refill takes less than 2 seconds.  Neurological:     Mental Status: She is alert.     Comments: Pt not following any commands.  She is staring off.  Psychiatric:        Speech: She is noncommunicative.    ED Results / Procedures / Treatments   Labs (all labs ordered are listed, but only abnormal results are displayed) Labs Reviewed  COMPREHENSIVE METABOLIC PANEL - Abnormal; Notable for the following components:      Result Value   Glucose, Bld 113 (*)    Calcium 8.7 (*)    Total Bilirubin 0.2 (*)    All other components within normal limits  SALICYLATE LEVEL - Abnormal; Notable for the following components:   Salicylate Lvl <7.0 (*)    All other components within normal limits  ACETAMINOPHEN LEVEL - Abnormal; Notable for the following components:   Acetaminophen (Tylenol), Serum <10 (*)    All other components within normal limits  RAPID URINE DRUG SCREEN, HOSP PERFORMED - Abnormal; Notable for the following components:   Tetrahydrocannabinol POSITIVE  (*)    All other components within normal limits  CBC  WITH DIFFERENTIAL/PLATELET - Abnormal; Notable for the following components:   WBC 10.8 (*)    All other components within normal limits  RESP PANEL BY RT-PCR (FLU A&B, COVID) ARPGX2  ETHANOL  URINALYSIS, ROUTINE W REFLEX MICROSCOPIC  CBG MONITORING, ED  I-STAT BETA HCG BLOOD, ED ([facility], [facility], [facility] ONLY)    EKG EKG Interpretation  Date/Time:  Wednesday September 21 2021 20:53:40 EDT Ventricular Rate:  110 PR Interval:  169 QRS Duration: 114 QT Interval:  375 QTC Calculation: 508 R Axis:   87 Text Interpretation: Sinus tachycardia Borderline intraventricular conduction delay Low voltage, precordial leads Prolonged QT interval Since last tracing rate faster Confirmed by Jacalyn Lefevre [phone]) on 09/21/2021 9:05:44 PM  Radiology DG Chest Port 1 View  Result Date: 09/21/2021 CLINICAL DATA:  Altered mental status. EXAM: PORTABLE CHEST 1 VIEW COMPARISON:  November 14, 2020 FINDINGS: The heart size and mediastinal contours are within normal limits. Low lung volumes are noted. Both lungs are clear. The visualized skeletal structures are unremarkable. IMPRESSION: No active disease. Electronically Signed   By: Aram Candela M.D.   On: 09/21/2021 20:51    Procedures Procedures   Medications Ordered in ED Medications  sodium chloride 0.9 % bolus 1,000 mL (0 mLs Intravenous Stopped 09/21/21 2210)    And  0.9 %  sodium chloride infusion ( Intravenous New Bag/Given 09/21/21 2206)    ED Course  I have reviewed the triage vital signs and the nursing notes.  Pertinent labs & imaging results that were available during my care of the patient were reviewed by me and considered in my medical decision making (see chart for details).    MDM Rules/Calculators/A&P                           Pt likely has marijuana intoxication due to the history of ingesting multiple edibles.  Pt will be observed until she is back to her normal self.  Final  Clinical Impression(s) / ED Diagnoses Final diagnoses:  Cannabis intoxication with delirium [facility])    Rx / DC Orders ED Discharge Orders     None        Jacalyn Lefevre, MD 09/21/21 2222

## 2021-09-21 NOTE — ED Triage Notes (Signed)
Pt BIB EMS from home. Upon arrival EMS states pt was hyperventilating, EMS states they were able to help pt slow down her breathing, pt was listening to commands. Pt was not speaking back to EMS. EMS reports family on scene states pt consumed an unknown amount of edibles.   18 L AC CBG 144 VSS

## 2021-09-21 NOTE — ED Notes (Signed)
Patient is now alert and talking

## 2021-09-22 ENCOUNTER — Other Ambulatory Visit: Payer: Self-pay

## 2021-10-05 ENCOUNTER — Telehealth: Payer: Medicaid Other | Admitting: Physician Assistant

## 2021-10-05 ENCOUNTER — Encounter: Payer: Self-pay | Admitting: Physician Assistant

## 2021-10-05 DIAGNOSIS — R1310 Dysphagia, unspecified: Secondary | ICD-10-CM | POA: Diagnosis not present

## 2021-10-05 DIAGNOSIS — R001 Bradycardia, unspecified: Secondary | ICD-10-CM | POA: Diagnosis not present

## 2021-10-05 NOTE — Progress Notes (Signed)
Virtual Visit Consent   Alexandra Gray, you are scheduled for a virtual visit with a [facility] provider today.     Just as with appointments in the office, your consent must be obtained to participate.  Your consent will be active for this visit and any virtual visit you may have with one of our providers in the next 365 days.     If you have a MyChart account, a copy of this consent can be sent to you electronically.  All virtual visits are billed to your insurance company just like a traditional visit in the office.    As this is a virtual visit, video technology does not allow for your provider to perform a traditional examination.  This may limit your provider's ability to fully assess your condition.  If your provider identifies any concerns that need to be evaluated in person or the need to arrange testing (such as labs, EKG, etc.), we will make arrangements to do so.     Although advances in technology are sophisticated, we cannot ensure that it will always work on either your end or our end.  If the connection with a video visit is poor, the visit may have to be switched to a telephone visit.  With either a video or telephone visit, we are not always able to ensure that we have a secure connection.     I need to obtain your verbal consent now.   Are you willing to proceed with your visit today?    Alexandra Gray has provided verbal consent on 10/05/2021 for a virtual visit (video or telephone).   Piedad Climes, [place]   Date: 10/05/2021 8:08 AM   Virtual Visit via Video Note   I, Piedad Climes, connected with  Alexandra Gray  ([id number], March 02, 1991) on 10/05/21 at  8:00 AM EST by a video-enabled telemedicine application and verified that I am speaking with the correct person using two identifiers.  Location: Patient: Virtual Visit Location Patient: Home Provider: Virtual Visit Location Provider: Home Office   I discussed the limitations of evaluation and  management by telemedicine and the availability of in person appointments. The patient expressed understanding and agreed to proceed.    History of Present Illness: Alexandra Gray is a 30 y.o. who identifies as a female who was assigned female at birth, and is being seen today for severe throat tightness and difficulty swallowing starting over past couple of days along with URI symptoms of fatigue, chest congestion and significant productive cough. Notes feeling very tired today, checking her heart rate and O2 at home. O2 good but pulse down in the 30s. Denies chest pain. SOB with exertion.  HPI: HPI  Problems:  Patient Active Problem List   Diagnosis Date Noted   Grief reaction 11/07/2020   High risk medication use 11/07/2020   H/O: C-section 01/04/2019   Cesarean delivery delivered 01/04/2019   GBS (group B Streptococcus carrier), +RV culture, currently pregnant 12/23/2018   GDM, class A2 08/12/2018   Chronic hypertension during pregnancy, antepartum 07/30/2018   BMI 50.0-59.9, adult (HCC) 05/28/2018   History of cesarean delivery affecting pregnancy 10/03/2015   Supervision of high risk pregnancy, antepartum 08/23/2015   Severe major depression without psychotic features (HCC) 02/18/2014   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   PTSD (post-traumatic stress disorder) 02/16/2014   Sleep apnea 02/16/2014   MDD (major depressive disorder), recurrent severe, 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 (Patient taking differently: Take 500 mg by mouth at bedtime.), Disp: 60 tablet, Rfl: 0   hydrochlorothiazide (HYDRODIURIL) 12.5 MG tablet, Take 12.5 mg by mouth  daily., Disp: , Rfl:    melatonin 3 MG TABS tablet, Take 1 tablet (3 mg total) by mouth at bedtime., Disp: 30 tablet, Rfl: 0   sertraline (ZOLOFT) 100 MG tablet, Take 1 tablet (100 mg total) by mouth daily., Disp: 30 tablet, Rfl: 0   Vitamin D, Ergocalciferol, (DRISDOL) 1.25 MG (50000 UNIT) CAPS capsule, Take 50,000 Units by mouth once a week. On Monday, Disp: , Rfl:   Observations/Objective: Patient is well-developed, well-nourished in painful distress.  Head is normocephalic, atraumatic.  No labored breathing. Speech is coherent with logical content but voice is quite hoarse. Notable trouble with swallowing. Patient is alert and oriented at baseline.   Assessment and Plan: 1. Dysphagia, unspecified type  2. Bradycardia ER disposition given. Family member present who agrees to take her to the ER immediately for assessment. Feel ok giving breathing and O2 are good for family member to take her since they are near a few of our ERs. Addresses given.   Follow Up Instructions: I discussed the assessment and treatment plan with the patient. The patient was provided an opportunity to ask questions and all were answered. The patient agreed with the plan and demonstrated an understanding of the instructions.  A copy of instructions were sent to the patient via MyChart unless otherwise noted below.   The patient was advised to call back or seek an in-person evaluation if the symptoms worsen or if the condition fails to improve as anticipated.  Time:  I spent 12 minutes with the patient via telehealth technology discussing the above problems/concerns.    Piedad Climes, PA-C

## 2021-10-06 ENCOUNTER — Telehealth: Payer: Medicaid Other | Admitting: Physician Assistant

## 2021-10-06 DIAGNOSIS — J069 Acute upper respiratory infection, unspecified: Secondary | ICD-10-CM

## 2021-10-06 MED ORDER — ONDANSETRON HCL 4 MG PO TABS: 4.0000 mg | ORAL_TABLET | Freq: Three times a day (TID) | ORAL | 0 refills | Status: DC | PRN

## 2021-10-06 MED ORDER — PSEUDOEPH-BROMPHEN-DM 30-2-10 MG/5ML PO SYRP: 5.0000 mL | ORAL_SOLUTION | Freq: Four times a day (QID) | ORAL | 0 refills | Status: DC | PRN

## 2021-10-06 MED ORDER — BENZONATATE 100 MG PO CAPS: 100.0000 mg | ORAL_CAPSULE | Freq: Three times a day (TID) | ORAL | 0 refills | Status: DC | PRN

## 2021-10-06 MED ORDER — IPRATROPIUM BROMIDE 0.03 % NA SOLN: 2.0000 | Freq: Two times a day (BID) | NASAL | 0 refills | Status: DC

## 2021-10-06 NOTE — Progress Notes (Signed)
Virtual Visit Consent   Alexandra Gray, you are scheduled for a virtual visit with a [facility] provider today.     Just as with appointments in the office, your consent must be obtained to participate.  Your consent will be active for this visit and any virtual visit you may have with one of our providers in the next 365 days.     If you have a MyChart account, a copy of this consent can be sent to you electronically.  All virtual visits are billed to your insurance company just like a traditional visit in the office.    As this is a virtual visit, video technology does not allow for your provider to perform a traditional examination.  This may limit your provider's ability to fully assess your condition.  If your provider identifies any concerns that need to be evaluated in person or the need to arrange testing (such as labs, EKG, etc.), we will make arrangements to do so.     Although advances in technology are sophisticated, we cannot ensure that it will always work on either your end or our end.  If the connection with a video visit is poor, the visit may have to be switched to a telephone visit.  With either a video or telephone visit, we are not always able to ensure that we have a secure connection.     I need to obtain your verbal consent now.   Are you willing to proceed with your visit today?    Alexandra Gray has provided verbal consent on 10/06/2021 for a virtual visit (video or telephone).   [name], PA-C   Date: 10/06/2021 8:21 AM   Virtual Visit via Video Note   I, [name], connected with  Alexandra Gray  ([id number], [birth year]) on 10/06/21 at  8:15 AM EST by a video-enabled telemedicine application and verified that I am speaking with the correct person using two identifiers.  Location: Patient: Virtual Visit Location Patient: Home Provider: Virtual Visit Location Provider: Home Office   I discussed the limitations of evaluation and  management by telemedicine and the availability of in person appointments. The patient expressed understanding and agreed to proceed.    History of Present Illness: Alexandra Gray is a 30 y.o. who identifies as a female who was assigned female at birth, and is being seen today for possible sinus infection.  HPI: Sinusitis This is a new problem. The current episode started in the past 7 days (2-3 days ago). The problem has been gradually worsening since onset. There has been no fever. The pain is moderate. Associated symptoms include congestion, coughing, ear pain (right side), headaches, shortness of breath, sinus pressure, a sore throat and swollen glands. Pertinent negatives include no chills. Past treatments include lying down and sitting up (throat spray).   Has not been tested for covid or flu. Did a virtual visit yesterday and was advised to go to the ER for significant bradycardia (in the 30s reported). She did not go. States her HR is better, just wants to sleep.  Problems:  Patient Active Problem List   Diagnosis Date Noted   Grief reaction 11/07/2020   High risk medication use 11/07/2020   H/O: C-section 01/04/2019   Cesarean delivery delivered 01/04/2019   GBS (group B Streptococcus carrier), +RV culture, currently pregnant 12/23/2018   GDM, class A2 08/12/2018   Chronic hypertension during pregnancy, antepartum 07/30/2018   BMI 50.0-59.9, adult (HCC) 05/28/2018  History of cesarean delivery affecting pregnancy 10/03/2015   Supervision of high risk pregnancy, antepartum 08/23/2015   Severe major depression without psychotic features (HCC) 02/18/2014   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   PTSD (post-traumatic stress disorder) 02/16/2014   Sleep apnea 02/16/2014   MDD (major depressive disorder), recurrent severe, 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    Allergies:  Allergies  Allergen Reactions   Infuvite Adult [Multiple Vitamin] Shortness Of Breath   Nubain  [Nalbuphine Hcl] Other (See Comments)    Makes patient hot   Medications:  Current Outpatient Medications:    benzonatate (TESSALON) 100 MG capsule, Take 1 capsule (100 mg total) by mouth 3 (three) times daily as needed., Disp: 30 capsule, Rfl: 0   brompheniramine-pseudoephedrine-DM 30-2-10 MG/5ML syrup, Take 5 mLs by mouth 4 (four) times daily as needed., Disp: 120 mL, Rfl: 0   ipratropium (ATROVENT) 0.03 % nasal spray, Place 2 sprays into both nostrils every 12 (twelve) hours., Disp: 30 mL, Rfl: 0   ondansetron (ZOFRAN) 4 MG tablet, Take 1 tablet (4 mg total) by mouth every 8 (eight) hours as needed for nausea or vomiting., Disp: 20 tablet, Rfl: 0   divalproex (DEPAKOTE) 500 MG DR tablet, Take 1 tablet (500 mg total) by mouth every 12 (twelve) hours. (Patient taking differently: Take 500 mg by mouth at bedtime.), Disp: 60 tablet, Rfl: 0   hydrochlorothiazide (HYDRODIURIL) 12.5 MG tablet, Take 12.5 mg by mouth daily., Disp: , Rfl:    melatonin 3 MG TABS tablet, Take 1 tablet (3 mg total) by mouth at bedtime., Disp: 30 tablet, Rfl: 0   sertraline (ZOLOFT) 100 MG tablet, Take 1 tablet (100 mg total) by mouth daily., Disp: 30 tablet, Rfl: 0   Vitamin D, Ergocalciferol, (DRISDOL) 1.25 MG (50000 UNIT) CAPS capsule, Take 50,000 Units by mouth once a week. On Monday, Disp: , Rfl:   Observations/Objective: Patient is well-developed, well-nourished in no acute distress.  Appears ill Resting comfortably at home.  Head is normocephalic, atraumatic.  No labored breathing.  Speech is clear and coherent with logical content.  Patient is alert and oriented at baseline.    Assessment and Plan: 1. Viral URI with cough - brompheniramine-pseudoephedrine-DM 30-2-10 MG/5ML syrup; Take 5 mLs by mouth 4 (four) times daily as needed.  Dispense: 120 mL; Refill: 0 - benzonatate (TESSALON) 100 MG capsule; Take 1 capsule (100 mg total) by mouth 3 (three) times daily as needed.  Dispense: 30 capsule; Refill: 0 -  ondansetron (ZOFRAN) 4 MG tablet; Take 1 tablet (4 mg total) by mouth every 8 (eight) hours as needed for nausea or vomiting.  Dispense: 20 tablet; Refill: 0 - ipratropium (ATROVENT) 0.03 % nasal spray; Place 2 sprays into both nostrils every 12 (twelve) hours.  Dispense: 30 mL; Refill: 0  - Suspect Viral URI such as covid, flu or even RSV - Will treat with Bromfed DM and tessalon perles for cough - Zofran for nausea - Ipratropium nasal spray for congestion and drainage - Push fluids - Rest - Please be seen in person if symptoms continue to worsen or fail to improve.  Follow Up Instructions: I discussed the assessment and treatment plan with the patient. The patient was provided an opportunity to ask questions and all were answered. The patient agreed with the plan and demonstrated an understanding of the instructions.  A copy of instructions were sent to the patient via MyChart unless otherwise noted below.    The patient was advised to call back or seek  an in-person evaluation if the symptoms worsen or if the condition fails to improve as anticipated.  Time:  I spent 11 minutes with the patient via telehealth technology discussing the above problems/concerns.    [name], PA-C

## 2021-10-06 NOTE — Patient Instructions (Signed)
Alexandra Gray, thank you for joining [name], PA-C for today's virtual visit.  While this provider is not your primary care provider (PCP), if your PCP is located in our provider database this encounter information will be shared with them immediately following your visit.  Consent: (Patient) Alexandra Gray provided verbal consent for this virtual visit at the beginning of the encounter.  Current Medications:  Current Outpatient Medications:    benzonatate (TESSALON) 100 MG capsule, Take 1 capsule (100 mg total) by mouth 3 (three) times daily as needed., Disp: 30 capsule, Rfl: 0   brompheniramine-pseudoephedrine-DM 30-2-10 MG/5ML syrup, Take 5 mLs by mouth 4 (four) times daily as needed., Disp: 120 mL, Rfl: 0   ipratropium (ATROVENT) 0.03 % nasal spray, Place 2 sprays into both nostrils every 12 (twelve) hours., Disp: 30 mL, Rfl: 0   ondansetron (ZOFRAN) 4 MG tablet, Take 1 tablet (4 mg total) by mouth every 8 (eight) hours as needed for nausea or vomiting., Disp: 20 tablet, Rfl: 0   divalproex (DEPAKOTE) 500 MG DR tablet, Take 1 tablet (500 mg total) by mouth every 12 (twelve) hours. (Patient taking differently: Take 500 mg by mouth at bedtime.), Disp: 60 tablet, Rfl: 0   hydrochlorothiazide (HYDRODIURIL) 12.5 MG tablet, Take 12.5 mg by mouth daily., Disp: , Rfl:    melatonin 3 MG TABS tablet, Take 1 tablet (3 mg total) by mouth at bedtime., Disp: 30 tablet, Rfl: 0   sertraline (ZOLOFT) 100 MG tablet, Take 1 tablet (100 mg total) by mouth daily., Disp: 30 tablet, Rfl: 0   Vitamin D, Ergocalciferol, (DRISDOL) 1.25 MG (50000 UNIT) CAPS capsule, Take 50,000 Units by mouth once a week. On Monday, Disp: , Rfl:    Medications ordered in this encounter:  Meds ordered this encounter  Medications   brompheniramine-pseudoephedrine-DM 30-2-10 MG/5ML syrup    Sig: Take 5 mLs by mouth 4 (four) times daily as needed.    Dispense:  120 mL    Refill:  0    Order Specific Question:    Supervising Provider    Answer:   MILLER, BRIAN [[id number]]   benzonatate (TESSALON) 100 MG capsule    Sig: Take 1 capsule (100 mg total) by mouth 3 (three) times daily as needed.    Dispense:  30 capsule    Refill:  0    Order Specific Question:   Supervising Provider    Answer:   MILLER, BRIAN [[id number]]   ondansetron (ZOFRAN) 4 MG tablet    Sig: Take 1 tablet (4 mg total) by mouth every 8 (eight) hours as needed for nausea or vomiting.    Dispense:  20 tablet    Refill:  0    Order Specific Question:   Supervising Provider    Answer:   MILLER, BRIAN [[id number]]   ipratropium (ATROVENT) 0.03 % nasal spray    Sig: Place 2 sprays into both nostrils every 12 (twelve) hours.    Dispense:  30 mL    Refill:  0    Order Specific Question:   Supervising Provider    Answer:   Hyacinth Meeker, BRIAN [[id number]]     *If you need refills on other medications prior to your next appointment, please contact your pharmacy*  Follow-Up: Call back or seek an in-person evaluation if the symptoms worsen or if the condition fails to improve as anticipated.  Other Instructions Upper Respiratory Infection, Adult An upper respiratory infection (URI) affects the nose, throat, and upper airways that lead to  the lungs. The most common type of URI is often called the common cold. URIs usually get better on their own, without medical treatment. What are the causes? A URI is caused by a germ (virus). You may catch these germs by: Breathing in droplets from an infected person's cough or sneeze. Touching something that has the germ on it (is contaminated) and then touching your mouth, nose, or eyes. What increases the risk? You are more likely to get a URI if: You are very young or very old. You have close contact with others, such as at work, school, or a health care facility. You smoke. You have long-term (chronic) heart or lung disease. You have a weakened disease-fighting system (immune system). You have nasal allergies or  asthma. You have a lot of stress. You have poor nutrition. What are the signs or symptoms? Runny or stuffy (congested) nose. Cough. Sneezing. Sore throat. Headache. Feeling tired (fatigue). Fever. Not wanting to eat as much as usual. Pain in your forehead, behind your eyes, and over your cheekbones (sinus pain). Muscle aches. Redness or irritation of the eyes. Pressure in the ears or face. How is this treated? URIs usually get better on their own within 7-10 days. Medicines cannot cure URIs, but your doctor may recommend certain medicines to help relieve symptoms, such as: Over-the-counter cold medicines. Medicines to reduce coughing (cough suppressants). Coughing is a type of defense against infection that helps to clear the nose, throat, windpipe, and lungs (respiratory system). Take these medicines only as told by your doctor. Medicines to lower your fever. Follow these instructions at home: Activity Rest as needed. If you have a fever, stay home from work or school until your fever is gone, or until your doctor says you may return to work or school. You should stay home until you cannot spread the infection anymore (you are not contagious). Your doctor may have you wear a face mask so you have less risk of spreading the infection. Relieving symptoms Rinse your mouth often with salt water. To make salt water, dissolve -1 tsp (3-6 g) of salt in 1 cup (237 mL) of warm water. Use a cool-mist humidifier to add moisture to the air. This can help you breathe more easily. Eating and drinking  Drink enough fluid to keep your pee (urine) pale yellow. Eat soups and other clear broths. General instructions  Take over-the-counter and prescription medicines only as told by your doctor. Do not smoke or use any products that contain nicotine or tobacco. If you need help quitting, ask your doctor. Avoid being where people are smoking (avoid secondhand smoke). Stay up to date on all your  shots (immunizations), and get the flu shot every year. Keep all follow-up visits. How to prevent the spread of infection to others  Wash your hands with soap and water for at least 20 seconds. If you cannot use soap and water, use hand sanitizer. Avoid touching your mouth, face, eyes, or nose. Cough or sneeze into a tissue or your sleeve or elbow. Do not cough or sneeze into your hand or into the air. Contact a doctor if: You are Gray worse, not better. You have any of these: A fever or chills. Brown or red mucus in your nose. Yellow or brown fluid (discharge)coming from your nose. Pain in your face, especially when you bend forward. Swollen neck glands. Pain when you swallow. White areas in the back of your throat. Get help right away if: You have shortness of breath  that gets worse. You have very bad or constant: Headache. Ear pain. Pain in your forehead, behind your eyes, and over your cheekbones (sinus pain). Chest pain. You have long-lasting (chronic) lung disease along with any of these: Making high-pitched whistling sounds when you breathe, most often when you breathe out (wheezing). Long-lasting cough (more than 14 days). Coughing up blood. A change in your usual mucus. You have a stiff neck. You have changes in your: Vision. Hearing. Thinking. Mood. These symptoms may be an emergency. Get help right away. Call 911. Do not wait to see if the symptoms will go away. Do not drive yourself to the hospital. Summary An upper respiratory infection (URI) is caused by a germ (virus). The most common type of URI is often called the common cold. URIs usually get better within 7-10 days. Take over-the-counter and prescription medicines only as told by your doctor. This information is not intended to replace advice given to you by your health care provider. Make sure you discuss any questions you have with your health care provider. Document Revised: 06/15/2021 Document  Reviewed: 06/15/2021 Elsevier Patient Education  2022 [employer].    If you have been instructed to have an in-person evaluation today at a local Urgent Care facility, please use the link below. It will take you to a list of all of our available [facility], including address, phone number and hours of operation. Please do not delay care.  [facility]  If you or a family member do not have a primary care provider, use the link below to schedule a visit and establish care. When you choose a [facility] primary care physician or advanced practice provider, you gain a long-term partner in health. Find a Primary Care Provider  Learn more about [facility] in-office and virtual care options: [facility] - Get Care Now

## 2021-10-07 ENCOUNTER — Inpatient Hospital Stay (HOSPITAL_COMMUNITY)
Admission: EM | Admit: 2021-10-07 | Discharge: 2021-10-11 | DRG: 194 | Disposition: A | Discharge status: Home / Self Care | Payer: Medicaid Other | Attending: Family Medicine | Admitting: Family Medicine

## 2021-10-07 ENCOUNTER — Emergency Department (HOSPITAL_COMMUNITY): Payer: Medicaid Other

## 2021-10-07 ENCOUNTER — Other Ambulatory Visit: Payer: Self-pay

## 2021-10-07 ENCOUNTER — Ambulatory Visit: Payer: Self-pay | Admitting: *Deleted

## 2021-10-07 DIAGNOSIS — R1310 Dysphagia, unspecified: Secondary | ICD-10-CM

## 2021-10-07 DIAGNOSIS — G4730 Sleep apnea, unspecified: Secondary | ICD-10-CM | POA: Diagnosis present

## 2021-10-07 DIAGNOSIS — J020 Streptococcal pharyngitis: Secondary | ICD-10-CM

## 2021-10-07 DIAGNOSIS — Z20822 Contact with and (suspected) exposure to covid-19: Secondary | ICD-10-CM | POA: Diagnosis present

## 2021-10-07 DIAGNOSIS — J069 Acute upper respiratory infection, unspecified: Secondary | ICD-10-CM

## 2021-10-07 DIAGNOSIS — Z6841 Body Mass Index (BMI) 40.0 and over, adult: Secondary | ICD-10-CM

## 2021-10-07 DIAGNOSIS — Z79899 Other long term (current) drug therapy: Secondary | ICD-10-CM

## 2021-10-07 DIAGNOSIS — I10 Essential (primary) hypertension: Secondary | ICD-10-CM | POA: Diagnosis present

## 2021-10-07 DIAGNOSIS — J189 Pneumonia, unspecified organism: Secondary | ICD-10-CM

## 2021-10-07 DIAGNOSIS — Z23 Encounter for immunization: Secondary | ICD-10-CM

## 2021-10-07 LAB — CBC WITH DIFFERENTIAL/PLATELET
Abs Immature Granulocytes: 0.08 10*3/uL — ABNORMAL HIGH (ref 0.00–0.07)
Basophils Absolute: 0 10*3/uL (ref 0.0–0.1)
Basophils Relative: 0 %
Eosinophils Absolute: 0.3 10*3/uL (ref 0.0–0.5)
Eosinophils Relative: 2 %
HCT: 43.1 % (ref 36.0–46.0)
Hemoglobin: 13.9 g/dL (ref 12.0–15.0)
Immature Granulocytes: 1 %
Lymphocytes Relative: 12 %
Lymphs Abs: 1.8 10*3/uL (ref 0.7–4.0)
MCH: 26.4 pg (ref 26.0–34.0)
MCHC: 32.3 g/dL (ref 30.0–36.0)
MCV: 81.9 fL (ref 80.0–100.0)
Monocytes Absolute: 1.1 10*3/uL — ABNORMAL HIGH (ref 0.1–1.0)
Monocytes Relative: 7 %
Neutro Abs: 11.5 10*3/uL — ABNORMAL HIGH (ref 1.7–7.7)
Neutrophils Relative %: 78 %
Platelets: 341 10*3/uL (ref 150–400)
RBC: 5.26 MIL/uL — ABNORMAL HIGH (ref 3.87–5.11)
RDW: 13.9 % (ref 11.5–15.5)
WBC: 14.8 10*3/uL — ABNORMAL HIGH (ref 4.0–10.5)
nRBC: 0 % (ref 0.0–0.2)

## 2021-10-07 LAB — RESP PANEL BY RT-PCR (FLU A&B, COVID) ARPGX2
Influenza A by PCR: NEGATIVE
Influenza B by PCR: NEGATIVE
SARS Coronavirus 2 by RT PCR: NEGATIVE

## 2021-10-07 LAB — BASIC METABOLIC PANEL
Anion gap: 12 (ref 5–15)
BUN: 7 mg/dL (ref 6–20)
CO2: 26 mmol/L (ref 22–32)
Calcium: 8.8 mg/dL — ABNORMAL LOW (ref 8.9–10.3)
Chloride: 98 mmol/L (ref 98–111)
Creatinine, Ser: 0.73 mg/dL (ref 0.44–1.00)
GFR, Estimated: >60 mL/min (ref >60)
Glucose, Bld: 110 mg/dL — ABNORMAL HIGH (ref 70–99)
Potassium: 3.2 mmol/L — ABNORMAL LOW (ref 3.5–5.1)
Sodium: 136 mmol/L (ref 135–145)

## 2021-10-07 LAB — I-STAT BETA HCG BLOOD, ED (MC, WL, AP ONLY): I-stat hCG, quantitative: <5 m[IU]/mL (ref <5)

## 2021-10-07 LAB — LACTIC ACID, PLASMA: Lactic Acid, Venous: 1.3 mmol/L (ref 0.5–1.9)

## 2021-10-07 LAB — GROUP A STREP BY PCR: Group A Strep by PCR: DETECTED — AB

## 2021-10-07 IMAGING — CT CT NECK W/ CM
5 of 6 series · 14 of 33 positions shown, 16 images · IV contrast (Omni 300)
Comparison: [DATE]

CLINICAL DATA: Concern for deep neck infection or peritonsillar
abscess. Severe sore throat.

EXAM:
CT NECK WITH CONTRAST
TECHNIQUE: Multidetector CT imaging of the neck was performed using the
standard protocol following the bolus administration of intravenous
contrast.
CONTRAST:  75mL OMNIPAQUE IOHEXOL 350 MG/ML SOLN

[Series 3: neck 2.0 st · axial · 0.46mm/px · z∈[-326,-250]mm · 2 of 115 slices shown, 3 images]
[im 39/115  soft-tissue]
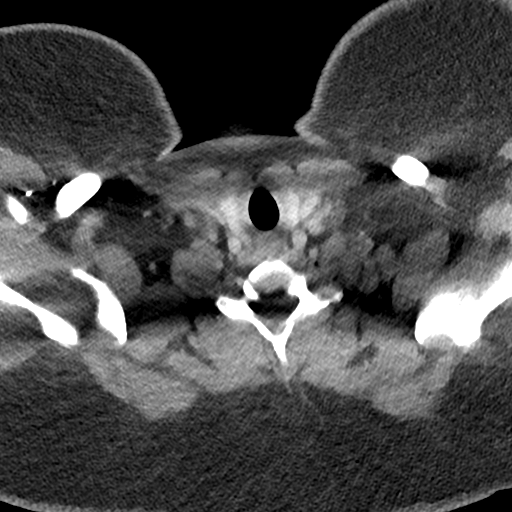
[im 39/115  bone]
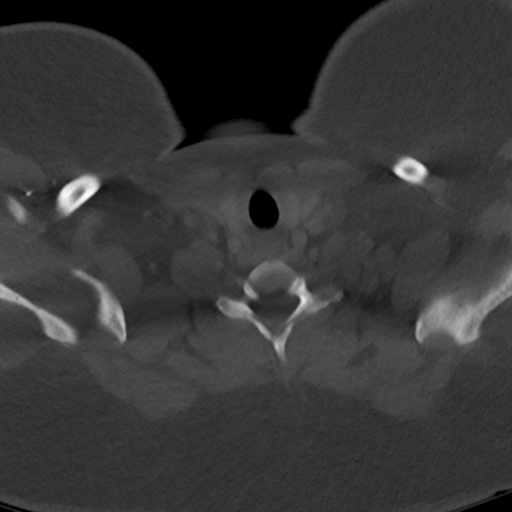
[im 77/115  bone]
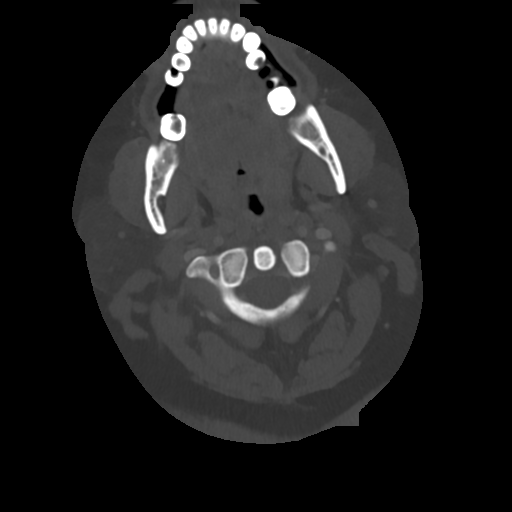

[Series 5: bone · axial · 0.46mm/px · z∈[-328,-254]mm · 2 of 112 slices shown]
[im 38/112  bone]
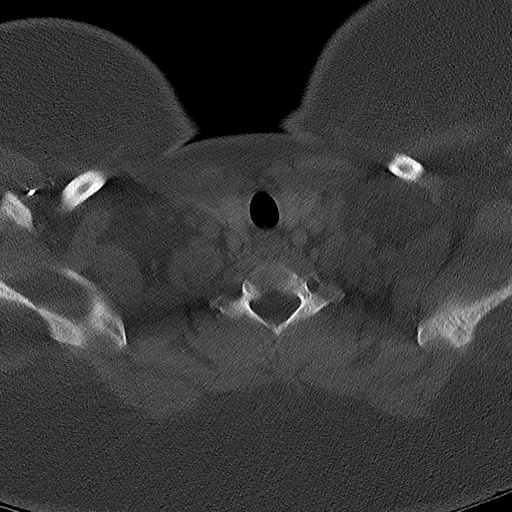
[im 75/112  bone]
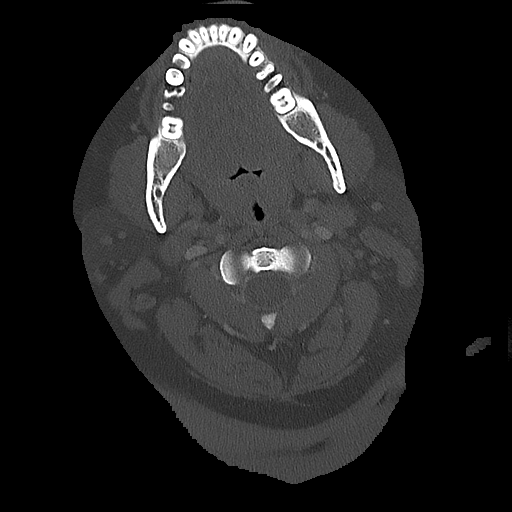

[Series 6: sagittal · sagittal · 0.45mm/px · 5 of 101 slices shown, 6 images]
[im 34/101  bone]
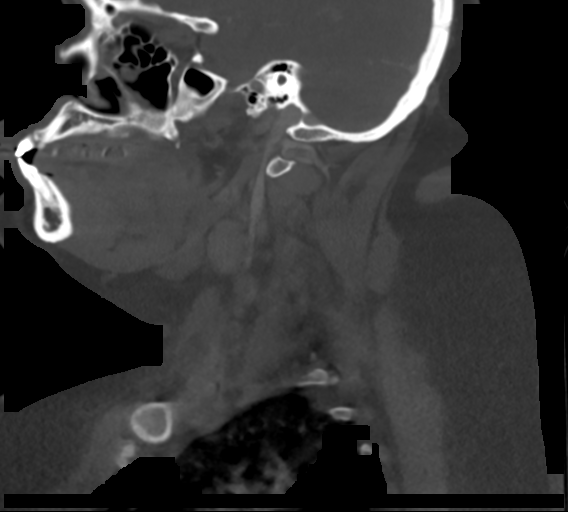
[im 42/101  bone]
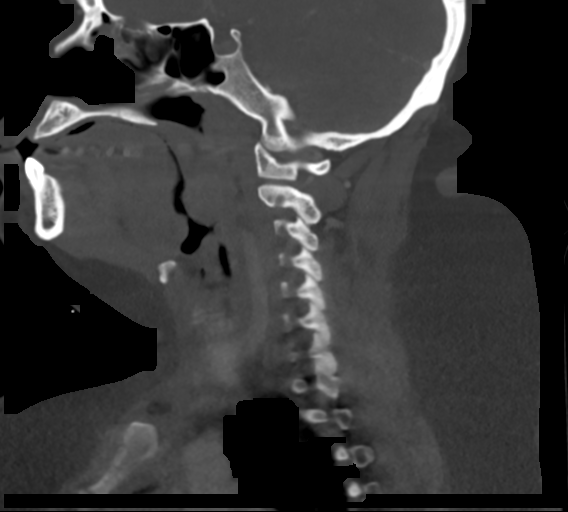
[im 51/101  soft-tissue]
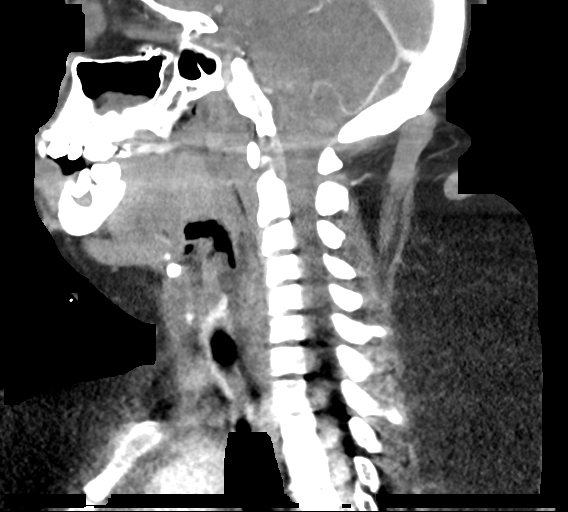
[im 51/101  bone]
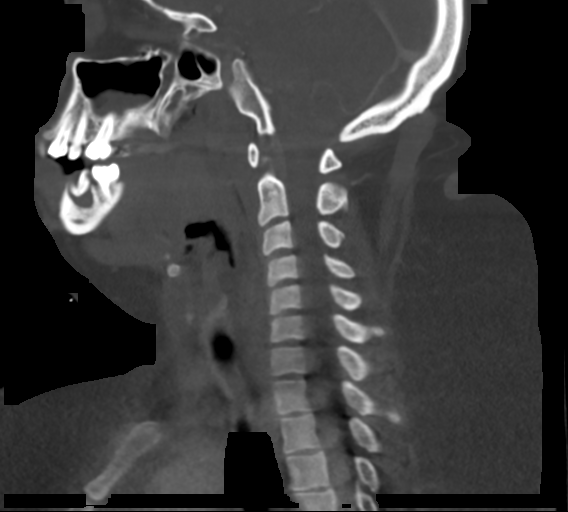
[im 59/101  bone]
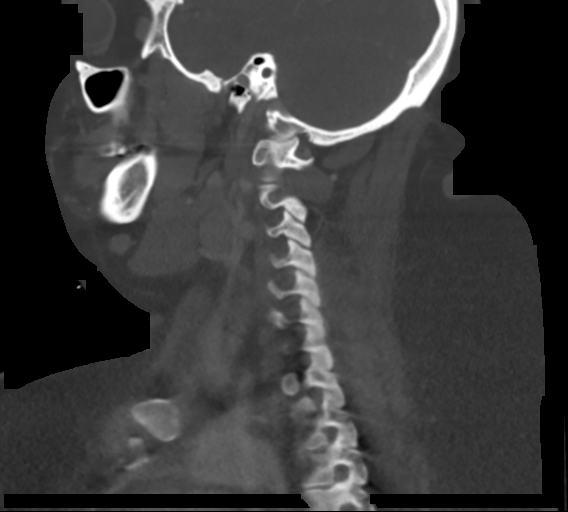
[im 67/101  bone]
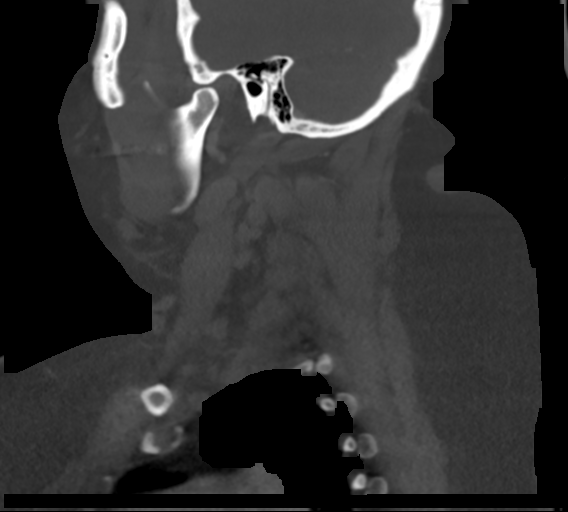

[Series 7: coronal · coronal · 0.45mm/px · 3 of 101 slices shown]
[im 21/101  bone]
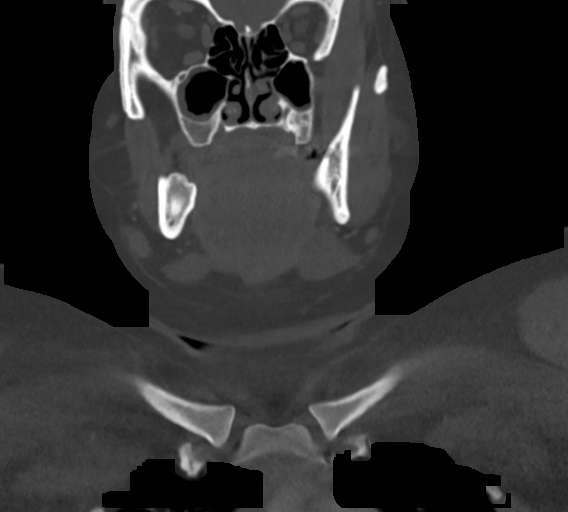
[im 41/101  bone]
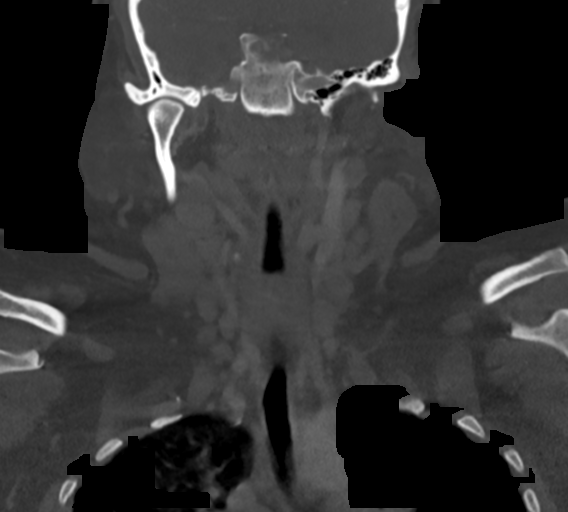
[im 61/101  bone]
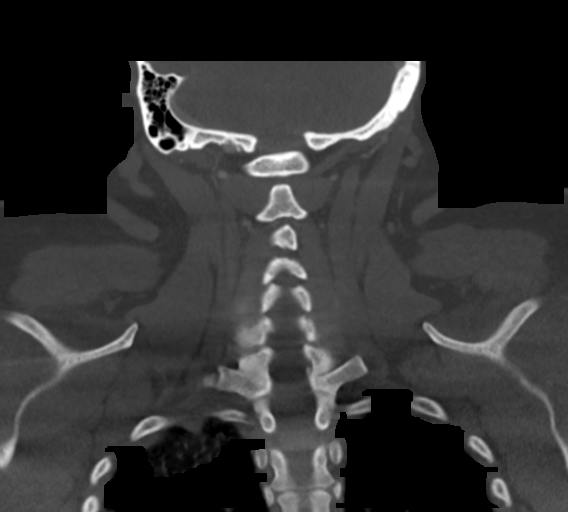

[Series 8: orthogonal · axial · 0.39mm/px · z∈[-326,-250]mm · 2 of 114 slices shown]
[im 38/114  bone]
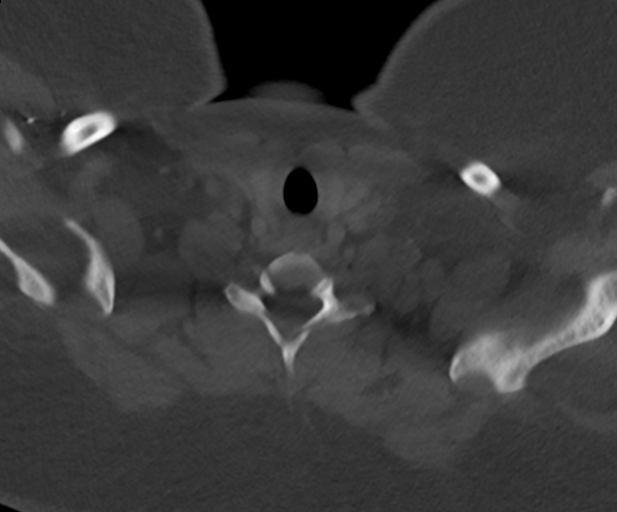
[im 76/114  bone]
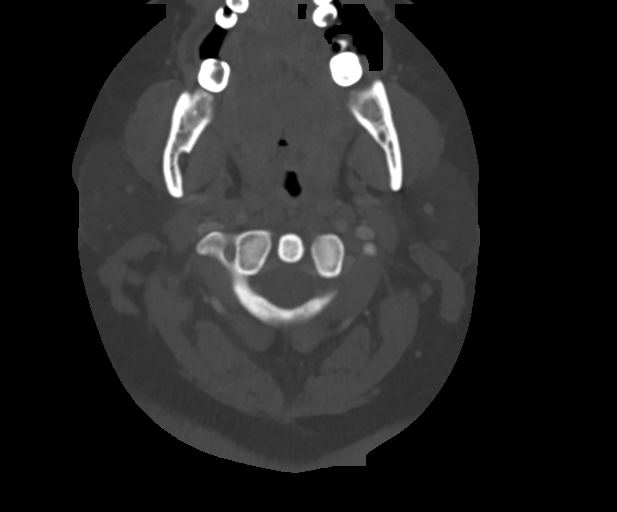

[14 of 33 positions shown; findings below may reference images not displayed]

FINDINGS: Pharynx and larynx: Mild hypertrophy of the palatine nasopharyngeal
adenoidal tissue, without focal low-density collection to suggest
abscess. The parapharyngeal fat is preserved throughout.

Salivary glands: No inflammation, mass, or stone.

Thyroid: Normal.

Lymph nodes: Enlarged bilateral level 2 lymph nodes, the largest of
which measures up to 1.6 cm on the right (series 3, image 51), and
1.6 cm on the left (series 3, image 49), likely reactive, as are
prominent bilateral level 3 lymph nodes. Prominent level 1A and B
lymph nodes appear overall unchanged compared to [DATE], for
example a left level 1A lymph node that measures up to 0.6 cm in
short axis, previously 0.7 cm.

Vascular: Negative.

Limited intracranial: Negative.

Visualized orbits: Negative.

Mastoids and visualized paranasal sinuses: Mucosal thickening in the
maxillary sinuses and ethmoid air cells. The mastoids are well
aerated.

Skeleton: No acute osseous abnormality.

Upper chest: Evaluation is somewhat limited by motion artifact;
however are nodular opacities in the right upper lobe. No pleural
effusion.

Other: None.
IMPRESSION: 1. Prominent tonsillar tissue without evidence of abscess, likely
infectious or inflammatory.
2. Enlarged level 2 and prominent level 1 and 3 lymph nodes, likely
reactive.
3. Nodular opacities in the right upper lobe, incompletely evaluated
but concerning for infection.

## 2021-10-07 IMAGING — DX DG CHEST 2V
2 series · 2 of 2 positions shown · non-contrast
Comparison: Chest radiograph [DATE]

CLINICAL DATA: Productive cough.  Fever and chills.

EXAM:
CHEST - 2 VIEW

[chest pa]
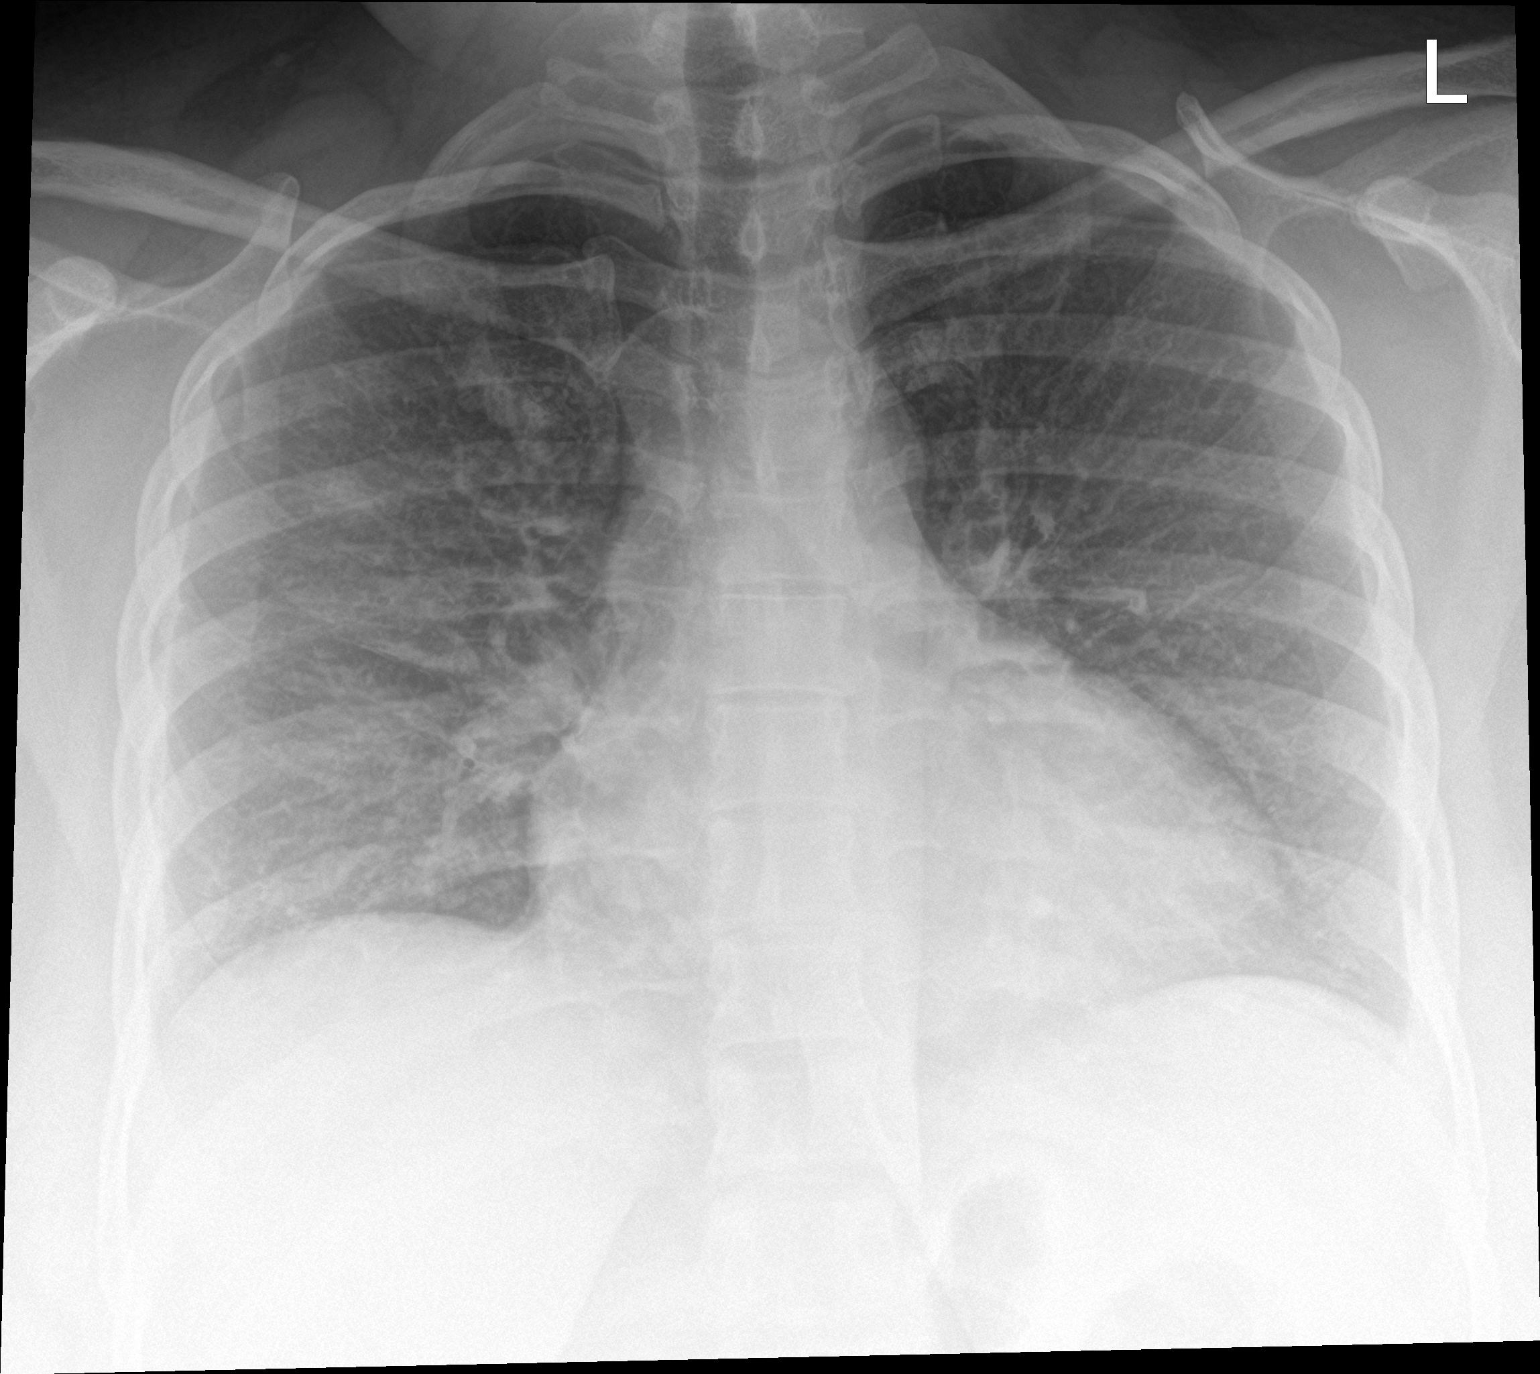

[chest lat]
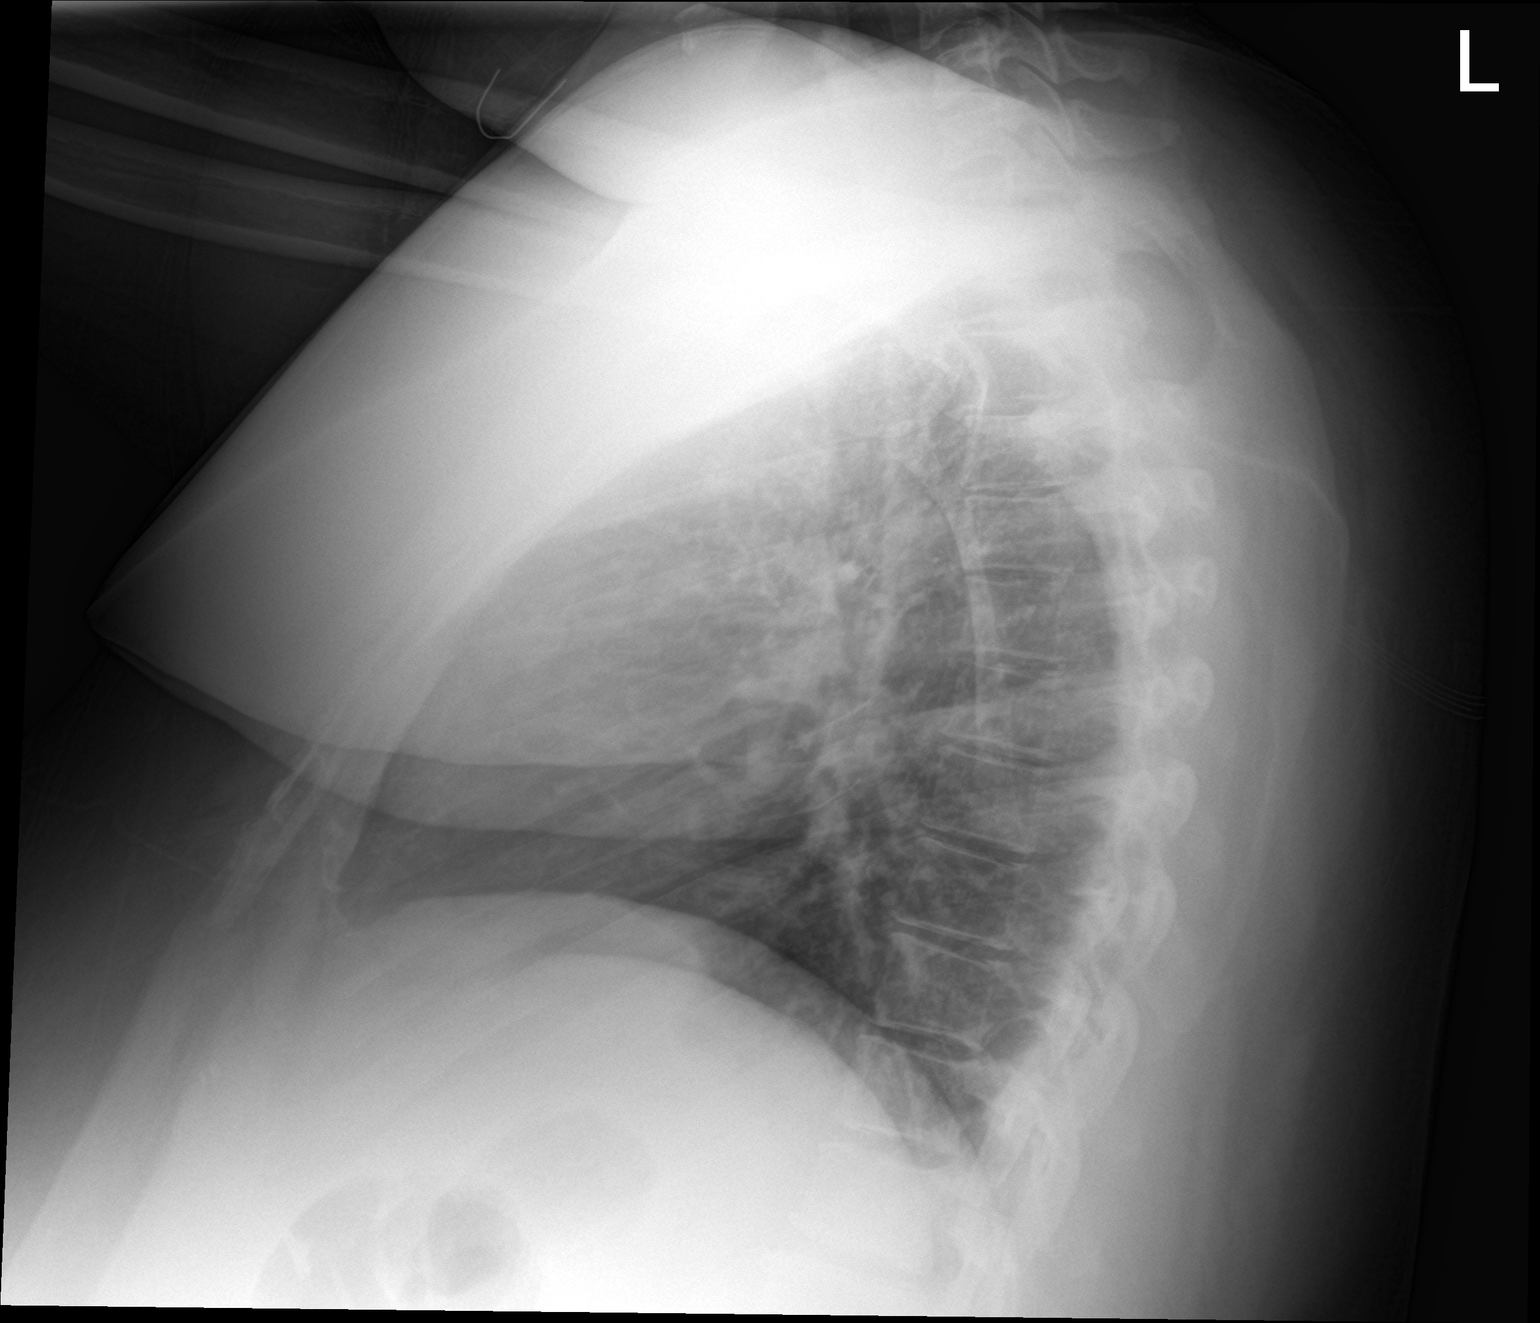

[2 of 2 positions shown; findings below may reference images not displayed]

FINDINGS: Borderline cardiomegaly with unchanged mediastinal contours.
Moderate bronchial thickening. Minimal patchy opacity in the right
upper lung zone. No pneumothorax or pleural effusion. No acute
osseous abnormalities are seen.
IMPRESSION: Moderate bronchial thickening with minimal patchy opacity in the
right upper lung zone suspicious for pneumonia.

## 2021-10-07 MED ORDER — ACETAMINOPHEN 160 MG/5ML PO SOLN
650.0000 mg | Freq: Once | ORAL | Status: AC
  Administered 2021-10-07: 650 mg via ORAL
  Filled 2021-10-07: qty 20.3

## 2021-10-07 MED ORDER — SODIUM CHLORIDE 0.9 % IV SOLN
3.0000 g | Freq: Once | INTRAVENOUS | Status: AC
  Administered 2021-10-07: 3 g via INTRAVENOUS
  Filled 2021-10-07: qty 8

## 2021-10-07 MED ORDER — IBUPROFEN 400 MG PO TABS
600.0000 mg | ORAL_TABLET | Freq: Once | ORAL | Status: AC
  Administered 2021-10-07: 600 mg via ORAL
  Filled 2021-10-07: qty 1

## 2021-10-07 MED ORDER — IOHEXOL 350 MG/ML SOLN
75.0000 mL | Freq: Once | INTRAVENOUS | Status: AC | PRN
  Administered 2021-10-07: 75 mL via INTRAVENOUS

## 2021-10-07 MED ORDER — DEXAMETHASONE SODIUM PHOSPHATE 10 MG/ML IJ SOLN
10.0000 mg | Freq: Once | INTRAMUSCULAR | Status: AC
  Administered 2021-10-07: 10 mg via INTRAVENOUS
  Filled 2021-10-07: qty 1

## 2021-10-07 MED ORDER — ACETAMINOPHEN 325 MG PO TABS
650.0000 mg | ORAL_TABLET | Freq: Once | ORAL | Status: DC
  Filled 2021-10-07: qty 2

## 2021-10-07 MED ORDER — FENTANYL CITRATE PF 50 MCG/ML IJ SOSY
50.0000 ug | PREFILLED_SYRINGE | Freq: Once | INTRAMUSCULAR | Status: AC
  Administered 2021-10-07: 50 ug via INTRAVENOUS
  Filled 2021-10-07: qty 1

## 2021-10-07 NOTE — Telephone Encounter (Signed)
Pt called in c/o having difficulty breathing and having a bad cough with brown green mucus coming up.   She feels like she is choking.   Having difficulty swallowing pills and drinking fluids.   Also c/o being short of breath.   She did a virtual visit yesterday for these symptoms however this morning she feels worse. I have instructed her to go to the ED which she is agreeable to doing.   She is going to [facility] ED.  See triage notes

## 2021-10-07 NOTE — ED Provider Notes (Signed)
Emergency Medicine Provider Triage Evaluation Note  Alexandra Gray , a 30 y.o. female  was evaluated in triage.  Pt complains of gradual onset, constant, sharp, sore throat for the past 5 days.  Patient reports difficulty with swallowing even her own saliva.  She states she has been trying to take liquid Tylenol and ibuprofen for her fevers without much success.  She states when she lays flat she begins choking and feeling short of breath.  She denies any recent sick contacts.  Review of Systems  Positive: + sore throat, difficulty swallowing, SOB Negative: - CP  Physical Exam  BP 130/82 (BP Location: Right Arm)   Pulse (!) 105   Temp (!) 100.7 F (38.2 C) (Oral)   Resp (!) 24   LMP  (LMP Unknown)   SpO2 98%  Gen:   Awake, no distress   Resp:  Normal effort  MSK:   Moves extremities without difficulty  Other:  Hot potato voice. Tolerating own secretions however. 2 finger trismus - from the posterior oropharynx that is visible pt with bilateral tonsilar hypertrophy and erythema. No exudate appreciated however exam limited. Uvula is midline.   Medical Decision Making  Medically screening exam initiated at 1:08 PM.  Appropriate orders placed.  Alexandra Gray was informed that the remainder of the evaluation will be completed by another provider, this initial triage assessment does not replace that evaluation, and the importance of remaining in the ED until their evaluation is complete.     Tanda Rockers, PA-C 10/07/21 1311    Arby Barrette, MD 10/08/21 [phone]

## 2021-10-07 NOTE — ED Triage Notes (Signed)
C/O throat discomfort, difficulty swallowing and shortness of breathe x 5 days. Stated she hasn't been able to eat or drink and when she lays down she feels like she is chocking

## 2021-10-07 NOTE — ED Notes (Signed)
Ambulated pt in hallway, pt had difficulty walking. Pulse ox while ambulating was 97

## 2021-10-07 NOTE — ED Notes (Signed)
Patient transported to x-ray. ?

## 2021-10-07 NOTE — Telephone Encounter (Signed)
Reason for Disposition  [1] MODERATE difficulty breathing (e.g., speaks in phrases, SOB even at rest, pulse 100-120) AND [2] still present when not coughing  Answer Assessment - Initial Assessment Questions 1. ONSET: "When did the cough begin?"      Pt calling in.  She did a virtual visit yesterday.   I feel like I'm choking on green-brown mucus I'm coughing up.   When I lay down I'm having trouble breathing.   I'm choking on the mucus.   I have a headache.    They gave me medication during the virtual visit.   The pills are hard to swallow. I asked if she had a PCP.   Her primary doctor did not send in the prescription.   I'm really struggling. 2. SEVERITY: "How bad is the cough today?"      I'm just coughing a lot 3. SPUTUM: "Describe the color of your sputum" (none, dry cough; clear, white, yellow, green)     The sputum is brown and green. 4. HEMOPTYSIS: "Are you coughing up any blood?" If so ask: "How much?" (flecks, streaks, tablespoons, etc.)     Yes a little bit. 5. DIFFICULTY BREATHING: "Are you having difficulty breathing?" If Yes, ask: "How bad is it?" (e.g., mild, moderate, severe)    - MILD: No SOB at rest, mild SOB with walking, speaks normally in sentences, can lie down, no retractions, pulse < 100.    - MODERATE: SOB at rest, SOB with minimal exertion and prefers to sit, cannot lie down flat, speaks in phrases, mild retractions, audible wheezing, pulse 100-120.    - SEVERE: Very SOB at rest, speaks in single words, struggling to breathe, sitting hunched forward, retractions, pulse > 120      I'm short of breath and start coughing when I up and around. 6. FEVER: "Do you have a fever?" If Yes, ask: "What is your temperature, how was it measured, and when did it start?"     I don't know.   I'm burning up.   7. CARDIAC HISTORY: "Do you have any history of heart disease?" (e.g., heart attack, congestive heart failure)      No just hypertension 8. LUNG HISTORY: "Do you have any  history of lung disease?"  (e.g., pulmonary embolus, asthma, emphysema)     No 9. PE RISK FACTORS: "Do you have a history of blood clots?" (or: recent major surgery, recent prolonged travel, bedridden)     No blood clots 10. OTHER SYMPTOMS: "Do you have any other symptoms?" (e.g., runny nose, wheezing, chest pain)       I have a runny nose, headache, body aches and earache in right ear. 11. PREGNANCY: "Is there any chance you are pregnant?" "When was your last menstrual period?"       No 12. TRAVEL: "Have you traveled out of the country in the last month?" (e.g., travel history, exposures)       No   Not been tested for Covid.  Protocols used: Cough - Acute Productive-A-AH

## 2021-10-07 NOTE — ED Provider Notes (Signed)
[facility] EMERGENCY DEPARTMENT Provider Note   CSN: [id number] Arrival date & time: 10/07/21  1300     History Chief Complaint  Patient presents with   Sore Throat   Shortness of Breath    Alexandra Gray is a 30 y.o. female.  The history is provided by the patient and medical records. No language interpreter was used.  Sore Throat This is a new problem. The current episode started more than 2 days ago. The problem occurs constantly. The problem has been gradually worsening. Pertinent negatives include no chest pain, no abdominal pain, no headaches and no shortness of breath. The symptoms are aggravated by swallowing. Nothing relieves the symptoms. She has tried nothing for the symptoms. The treatment provided no relief.  Cough Cough characteristics:  Productive Sputum characteristics:  Yellow Severity:  Moderate Onset quality:  Gradual Timing:  Constant Progression:  Worsening Chronicity:  New Relieved by:  Nothing Worsened by:  Nothing Ineffective treatments:  None tried Associated symptoms: chills, fever and rhinorrhea   Associated symptoms: no chest pain, no diaphoresis, no headaches, no rash, no shortness of breath, no sinus congestion and no wheezing       Past Medical History:  Diagnosis Date   Chlamydia    Depression    doing ok now   Gestational diabetes    Pt states she does not have DMII   Gonorrhea    History of pregnancy induced hypertension 12/06/2016   Hx of trichomoniasis    Hypertension    sometimes is up, never on meds   Mental disorder    Ovarian cyst    PID (pelvic inflammatory disease)    Sleep apnea     Patient Active Problem List   Diagnosis Date Noted   Grief reaction 11/07/2020   High risk medication use 11/07/2020   H/O: C-section 01/04/2019   Cesarean delivery delivered 01/04/2019   GBS (group B Streptococcus carrier), +RV culture, currently pregnant 12/23/2018   GDM, class A2 08/12/2018   Chronic  hypertension during pregnancy, antepartum 07/30/2018   BMI 50.0-59.9, adult (HCC) 05/28/2018   History of cesarean delivery affecting pregnancy 10/03/2015   Supervision of high risk pregnancy, antepartum 08/23/2015   Severe major depression without psychotic features (HCC) 02/18/2014   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   PTSD (post-traumatic stress disorder) 02/16/2014   Sleep apnea 02/16/2014   MDD (major depressive disorder), recurrent severe, 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    Past Surgical History:  Procedure Laterality Date   CESAREAN SECTION N/A 10/03/2015   Procedure: CESAREAN SECTION;  Surgeon: Tilda Burrow, MD;  Location: [facility];  Service: Obstetrics;  Laterality: N/A;   CESAREAN SECTION N/A 01/04/2019   Procedure: CESAREAN SECTION;  Surgeon: Hermina Staggers, MD;  Location: [facility] BIRTHING SUIT[facility];  Service: Obstetrics;  Laterality: N/A;   CESAREAN SECTION N/A    Phreesia 03/08/2021   KNEE SURGERY       OB History     Gravida  3   Para  2   Term  2   Preterm      AB  1   Living  2      SAB      IAB      Ectopic      Multiple  0   Live Births  2           Family History  Problem Relation Age of Onset   Healthy Mother    Healthy Father  Healthy Brother    Cancer Neg Hx    Diabetes Neg Hx    Heart disease Neg Hx    Hypertension Neg Hx    Stroke Neg Hx    Hearing loss Neg Hx     Social History   Tobacco Use   Smoking status: Never   Smokeless tobacco: Never  Vaping Use   Vaping Use: Never used  Substance Use Topics   Alcohol use: No   Drug use: No    Home Medications Prior to Admission medications   Medication Sig Start Date End Date Taking? Authorizing Provider  benzonatate (TESSALON) 100 MG capsule Take 1 capsule (100 mg total) by mouth 3 (three) times daily as needed. 10/06/21   [name], PA-C  brompheniramine-pseudoephedrine-DM 30-2-10 MG/5ML syrup Take 5 mLs by mouth 4 (four) times daily as needed.  10/06/21   [name], PA-C  divalproex (DEPAKOTE) 500 MG DR tablet Take 1 tablet (500 mg total) by mouth every 12 (twelve) hours. Patient taking differently: Take 500 mg by mouth at bedtime. 11/08/20   Thermon Leyland, NP  hydrochlorothiazide (HYDRODIURIL) 12.5 MG tablet Take 12.5 mg by mouth daily. 07/07/21   [provider]  ipratropium (ATROVENT) 0.03 % nasal spray Place 2 sprays into both nostrils every 12 (twelve) hours. 10/06/21   [name], PA-C  melatonin 3 MG TABS tablet Take 1 tablet (3 mg total) by mouth at bedtime. 11/08/20   Thermon Leyland, NP  ondansetron (ZOFRAN) 4 MG tablet Take 1 tablet (4 mg total) by mouth every 8 (eight) hours as needed for nausea or vomiting. 10/06/21   [name], PA-C  sertraline (ZOLOFT) 100 MG tablet Take 1 tablet (100 mg total) by mouth daily. 11/09/20   Thermon Leyland, NP  Vitamin D, Ergocalciferol, (DRISDOL) 1.25 MG (50000 UNIT) CAPS capsule Take 50,000 Units by mouth once a week. On Monday 07/01/21   [provider]    Allergies    Infuvite adult [multiple vitamin] and Nubain [nalbuphine hcl]  Review of Systems   Review of Systems  Constitutional:  Positive for chills and fever. Negative for diaphoresis and fatigue.  HENT:  Positive for congestion and rhinorrhea.   Eyes:  Negative for visual disturbance.  Respiratory:  Positive for cough. Negative for chest tightness, shortness of breath and wheezing.   Cardiovascular:  Negative for chest pain, palpitations and leg swelling.  Gastrointestinal:  Negative for abdominal pain, constipation, diarrhea, nausea and vomiting.  Genitourinary:  Negative for dysuria.  Musculoskeletal:  Negative for back pain, neck pain and neck stiffness.  Skin:  Negative for rash and wound.  Neurological:  Negative for light-headedness and headaches.  Psychiatric/Behavioral:  Negative for agitation and confusion.   All other systems reviewed and are negative.  Physical  Exam Updated Vital Signs BP (!) 134/100 (BP Location: Right Arm)   Pulse 100   Temp (!) 100.9 F (38.3 C) (Oral)   Resp 19   LMP  (LMP Unknown)   SpO2 99%   Physical Exam Vitals and nursing note reviewed.  Constitutional:      General: She is not in acute distress.    Appearance: She is well-developed. She is not ill-appearing, toxic-appearing or diaphoretic.  HENT:     Head: Normocephalic and atraumatic.     Nose: Congestion present.     Mouth/Throat:     Mouth: Mucous membranes are moist.     Pharynx: Uvula midline. Pharyngeal swelling and posterior oropharyngeal erythema  present. No oropharyngeal exudate or uvula swelling.     Comments: Erythema and swelling on the right greater than left tonsil.  Drainage.  Uvula still midline however. Eyes:     Conjunctiva/sclera: Conjunctivae normal.  Cardiovascular:     Rate and Rhythm: Regular rhythm. Tachycardia present.     Heart sounds: No murmur heard. Pulmonary:     Effort: Pulmonary effort is normal. No respiratory distress.     Breath sounds: No stridor. Rhonchi present. No decreased breath sounds, wheezing or rales.  Chest:     Chest wall: No tenderness.  Abdominal:     Palpations: Abdomen is soft.     Tenderness: There is no abdominal tenderness.  Musculoskeletal:     Cervical back: Neck supple.  Skin:    General: Skin is warm and dry.     Capillary Refill: Capillary refill takes less than 2 seconds.     Findings: No erythema.  Neurological:     General: No focal deficit present.     Mental Status: She is alert.  Psychiatric:        Mood and Affect: Mood normal.    ED Results / Procedures / Treatments   Labs (all labs ordered are listed, but only abnormal results are displayed) Labs Reviewed  GROUP A STREP BY PCR - Abnormal; Notable for the following components:      Result Value   Group A Strep by PCR DETECTED (*)    All other components within normal limits  BASIC METABOLIC PANEL - Abnormal; Notable for the  following components:   Potassium 3.2 (*)    Glucose, Bld 110 (*)    Calcium 8.8 (*)    All other components within normal limits  CBC WITH DIFFERENTIAL/PLATELET - Abnormal; Notable for the following components:   WBC 14.8 (*)    RBC 5.26 (*)    Neutro Abs 11.5 (*)    Monocytes Absolute 1.1 (*)    Abs Immature Granulocytes 0.08 (*)    All other components within normal limits  RESP PANEL BY RT-PCR (FLU A&B, COVID) ARPGX2  CULTURE, BLOOD (ROUTINE X 2)  CULTURE, BLOOD (ROUTINE X 2)  LACTIC ACID, PLASMA  I-STAT BETA HCG BLOOD, ED ([facility], [facility], [facility] ONLY)    EKG None  Radiology DG Chest 2 View  Result Date: 10/07/2021 CLINICAL DATA:  Productive cough.  Fever and chills. EXAM: CHEST - 2 VIEW COMPARISON:  Chest radiograph 09/21/2021 FINDINGS: Borderline cardiomegaly with unchanged mediastinal contours. Moderate bronchial thickening. Minimal patchy opacity in the right upper lung zone. No pneumothorax or pleural effusion. No acute osseous abnormalities are seen. IMPRESSION: Moderate bronchial thickening with minimal patchy opacity in the right upper lung zone suspicious for pneumonia. Electronically Signed   By: Narda Rutherford M.D.   On: 10/07/2021 19:17   CT Soft Tissue Neck W Contrast  Result Date: 10/07/2021 CLINICAL DATA:  Concern for deep neck infection or peritonsillar abscess. Severe sore throat. EXAM: CT NECK WITH CONTRAST TECHNIQUE: Multidetector CT imaging of the neck was performed using the standard protocol following the bolus administration of intravenous contrast. CONTRAST:  39mL OMNIPAQUE IOHEXOL 350 MG/ML SOLN COMPARISON:  06/04/2013 FINDINGS: Pharynx and larynx: Mild hypertrophy of the palatine nasopharyngeal adenoidal tissue, without focal low-density collection to suggest abscess. The parapharyngeal fat is preserved throughout. Salivary glands: No inflammation, mass, or stone. Thyroid: Normal. Lymph nodes: Enlarged bilateral level 2 lymph nodes, the largest of which measures up  to 1.6 cm on the right (series 3, image 51),  and 1.6 cm on the left (series 3, image 49), likely reactive, as are prominent bilateral level 3 lymph nodes. Prominent level 1A and B lymph nodes appear overall unchanged compared to 06/04/2013, for example a left level 1A lymph node that measures up to 0.6 cm in short axis, previously 0.7 cm. Vascular: Negative. Limited intracranial: Negative. Visualized orbits: Negative. Mastoids and visualized paranasal sinuses: Mucosal thickening in the maxillary sinuses and ethmoid air cells. The mastoids are well aerated. Skeleton: No acute osseous abnormality. Upper chest: Evaluation is somewhat limited by motion artifact; however are nodular opacities in the right upper lobe. No pleural effusion. Other: None. IMPRESSION: 1. Prominent tonsillar tissue without evidence of abscess, likely infectious or inflammatory. 2. Enlarged level 2 and prominent level 1 and 3 lymph nodes, likely reactive. 3. Nodular opacities in the right upper lobe, incompletely evaluated but concerning for infection. Electronically Signed   By: Wiliam Ke M.D.   On: 10/07/2021 18:15    Procedures Procedures   Medications Ordered in ED Medications  dexamethasone (DECADRON) injection 10 mg (has no administration in time range)  ibuprofen (ADVIL) tablet 600 mg (has no administration in time range)  acetaminophen (TYLENOL) 160 MG/5ML solution 650 mg (650 mg Oral Given 10/07/21 1608)  fentaNYL (SUBLIMAZE) injection 50 mcg (50 mcg Intravenous Given 10/07/21 1706)  iohexol (OMNIPAQUE) 350 MG/ML injection 75 mL (75 mLs Intravenous Contrast Given 10/07/21 1805)    ED Course  I have reviewed the triage vital signs and the nursing notes.  Pertinent labs & imaging results that were available during my care of the patient were reviewed by me and considered in my medical decision making (see chart for details).    MDM Rules/Calculators/A&P                           ROBBYN NYLIN is a 30 y.o.  female with a past medical history significant for hypertension who presents with fevers, chills, cough, sore throat, and difficulty swallowing.  According to patient over the last few days she had worsening symptoms of pain in her throat and URI symptoms with cough.  She reports that today she was getting to the point where she was having difficulty swallowing and when she tried to lay back she felt she was choking and having difficulty breathing as well.  This is all new.  She denies any chest pain but does report some chest tightness with the cough.  He reports no nausea, vomiting, constipation and reports occasional diarrhea.  She was found to be febrile, tachycardic, and tachypneic on arrival.  He reports he is having difficulty swallowing and is having to spit out some of her own saliva due to this.  She reports her voice sounds different and she is having difficulty opening her jaw fully.  In triage, patient work-up started including a flu/COVID swab and a strep test.  On exam, in the hallway, patient has some rhonchi but no wheezing.  No stridor appreciated but on oropharyngeal exam patient does have some asymmetry with her tonsils.  Right is bulging more than the left concerning for possible PTA.  She was able to open her mouth but she reports that sounds different when she speaks.  He said positioning made her breathing and swallowing worse.  She was spitting into a cup.  Otherwise no chest tenderness or abdominal tenderness.  No carotid bruit appreciated.  No other rashes seen and patient otherwise resting.  Clinically I  am concerned about PTA as her strep test returned positive from triage.  Patient also had some basic blood work revealing she does have a lactic acidosis of 14.8.  She is not pregnant.  Flu and COVID-negative.  Metabolic panel showed some mild hypokalemia and hypocalcemia but otherwise was reassuring.  Will order chest x-ray to look for pneumonia and a CT soft tissue neck to look  for large PTA or other deep space neck infection.  Will add on blood cultures and lactic acid.  We will give her some pain medicine.  Anticipate reassessment after work-up to determine disposition.  7:46 PM Patient CT scan did not show evidence of PTA or RPA.  There is some enlarged tonsillar tissue that is likely infectious but no abscess seen.  Also some reactive lymph nodes seen.  Concern for possible pneumonia seen on both CT and chest x-ray.  Given her productive cough I do suspect she has pneumonia.  Suspect she has both strep tonsillitis/pharyngitis and pneumonia.  We will ambulate with pulse oximetry to make sure she is not hypoxic and will make sure she can tolerate p.o. given her reported discomfort with swallowing.  We will give her a dose of Decadron to help with the irritation in her throat and will make sure she is stable for outpatient management with antibiotics to cover both areas of infection and with a prescription for pain medication.  We will give her a dose of Motrin for the fever as it appears she received Tylenol earlier.  Anticipate discharge if she is able to tolerate p.o. and is not hypoxic on ambulation.  8:27 PM After patient tried to take the ibuprofen, she reports that her throat feels like it is worsening.  She now does have some faint stridorous sounds with exhalation and she still does not feel she can tolerate p.o.  She is still febrile and tachycardic.  Given her worsening symptoms despite the antipyretics, pain medicine, and Decadron, I am concerned about her going home.  She is unable to tolerate p.o. thus unable to treat her pneumonia and significant strep pharyngitis as an outpatient.  Will call medicine for admission.   Final Clinical Impression(s) / ED Diagnoses Final diagnoses:  Strep pharyngitis  Community acquired pneumonia, unspecified laterality    Clinical Impression: 1. Strep pharyngitis   2. Community acquired pneumonia, unspecified  laterality     Disposition: Admit  This note was prepared with assistance of Conservation officer, historic buildings. Occasional wrong-word or sound-a-like substitutions may have occurred due to the inherent limitations of voice recognition software.     Collier Monica, Canary Brim, MD 10/07/21 [phone]

## 2021-10-08 ENCOUNTER — Encounter (HOSPITAL_COMMUNITY): Payer: Self-pay | Admitting: Family Medicine

## 2021-10-08 DIAGNOSIS — J020 Streptococcal pharyngitis: Secondary | ICD-10-CM | POA: Diagnosis present

## 2021-10-08 DIAGNOSIS — J189 Pneumonia, unspecified organism: Secondary | ICD-10-CM | POA: Diagnosis present

## 2021-10-08 DIAGNOSIS — G4730 Sleep apnea, unspecified: Secondary | ICD-10-CM | POA: Diagnosis present

## 2021-10-08 DIAGNOSIS — R1310 Dysphagia, unspecified: Secondary | ICD-10-CM

## 2021-10-08 DIAGNOSIS — I10 Essential (primary) hypertension: Secondary | ICD-10-CM | POA: Diagnosis present

## 2021-10-08 DIAGNOSIS — Z6841 Body Mass Index (BMI) 40.0 and over, adult: Secondary | ICD-10-CM

## 2021-10-08 DIAGNOSIS — Z23 Encounter for immunization: Secondary | ICD-10-CM | POA: Diagnosis not present

## 2021-10-08 DIAGNOSIS — Z79899 Other long term (current) drug therapy: Secondary | ICD-10-CM | POA: Diagnosis not present

## 2021-10-08 DIAGNOSIS — Z20822 Contact with and (suspected) exposure to covid-19: Secondary | ICD-10-CM | POA: Diagnosis present

## 2021-10-08 LAB — BASIC METABOLIC PANEL
Anion gap: 11 (ref 5–15)
BUN: 11 mg/dL (ref 6–20)
CO2: 24 mmol/L (ref 22–32)
Calcium: 8.6 mg/dL — ABNORMAL LOW (ref 8.9–10.3)
Chloride: 99 mmol/L (ref 98–111)
Creatinine, Ser: 0.71 mg/dL (ref 0.44–1.00)
GFR, Estimated: >60 mL/min (ref >60)
Glucose, Bld: 142 mg/dL — ABNORMAL HIGH (ref 70–99)
Potassium: 3.8 mmol/L (ref 3.5–5.1)
Sodium: 134 mmol/L — ABNORMAL LOW (ref 135–145)

## 2021-10-08 LAB — CBC
HCT: 43.1 % (ref 36.0–46.0)
Hemoglobin: 13.6 g/dL (ref 12.0–15.0)
MCH: 26.3 pg (ref 26.0–34.0)
MCHC: 31.6 g/dL (ref 30.0–36.0)
MCV: 83.4 fL (ref 80.0–100.0)
Platelets: 330 10*3/uL (ref 150–400)
RBC: 5.17 MIL/uL — ABNORMAL HIGH (ref 3.87–5.11)
RDW: 14.1 % (ref 11.5–15.5)
WBC: 11.1 10*3/uL — ABNORMAL HIGH (ref 4.0–10.5)
nRBC: 0 % (ref 0.0–0.2)

## 2021-10-08 LAB — HIV ANTIBODY (ROUTINE TESTING W REFLEX): HIV Screen 4th Generation wRfx: NONREACTIVE

## 2021-10-08 MED ORDER — BOOST / RESOURCE BREEZE PO LIQD CUSTOM
1.0000 | Freq: Three times a day (TID) | ORAL | Status: DC
  Administered 2021-10-08 – 2021-10-09 (×3): 1 via ORAL
  Filled 2021-10-08: qty 1

## 2021-10-08 MED ORDER — ADULT MULTIVITAMIN W/MINERALS CH
1.0000 | ORAL_TABLET | Freq: Every day | ORAL | Status: DC
Start: 2021-10-08 — End: 2021-10-11
  Administered 2021-10-08 – 2021-10-11 (×2): 1 via ORAL
  Filled 2021-10-08 (×2): qty 1

## 2021-10-08 MED ORDER — PHENOL 1.4 % MT LIQD
1.0000 | OROMUCOSAL | Status: DC | PRN
  Administered 2021-10-08: 1 via OROMUCOSAL
  Filled 2021-10-08 (×3): qty 177

## 2021-10-08 MED ORDER — BENZONATATE 100 MG PO CAPS
100.0000 mg | ORAL_CAPSULE | Freq: Three times a day (TID) | ORAL | Status: DC | PRN
  Administered 2021-10-08 – 2021-10-09 (×3): 100 mg via ORAL
  Filled 2021-10-08 (×3): qty 1

## 2021-10-08 MED ORDER — ENOXAPARIN SODIUM 40 MG/0.4ML IJ SOSY
40.0000 mg | PREFILLED_SYRINGE | INTRAMUSCULAR | Status: DC
  Administered 2021-10-08: 40 mg via SUBCUTANEOUS
  Filled 2021-10-08: qty 0.4

## 2021-10-08 MED ORDER — SODIUM CHLORIDE 0.9 % IV SOLN: 500.0000 mg | INTRAVENOUS | Status: DC

## 2021-10-08 MED ORDER — MELATONIN 3 MG PO TABS: 3.0000 mg | ORAL_TABLET | Freq: Every evening | ORAL | Status: DC | PRN

## 2021-10-08 MED ORDER — PROSOURCE PLUS PO LIQD
30.0000 mL | Freq: Three times a day (TID) | ORAL | Status: DC
Start: 2021-10-08 — End: 2021-10-11
  Administered 2021-10-08 – 2021-10-11 (×5): 30 mL via ORAL
  Filled 2021-10-08 (×6): qty 30

## 2021-10-08 MED ORDER — SODIUM CHLORIDE 0.9 % IV SOLN
3.0000 g | Freq: Four times a day (QID) | INTRAVENOUS | Status: DC
  Administered 2021-10-08 – 2021-10-10 (×9): 3 g via INTRAVENOUS
  Filled 2021-10-08 (×12): qty 8

## 2021-10-08 MED ORDER — LACTATED RINGERS IV SOLN: INTRAVENOUS | Status: DC

## 2021-10-08 MED ORDER — INFLUENZA VAC SPLIT QUAD 0.5 ML IM SUSY
0.5000 mL | PREFILLED_SYRINGE | INTRAMUSCULAR | Status: AC
  Administered 2021-10-09: 0.5 mL via INTRAMUSCULAR
  Filled 2021-10-08: qty 0.5

## 2021-10-08 MED ORDER — SERTRALINE HCL 100 MG PO TABS
100.0000 mg | ORAL_TABLET | Freq: Every day | ORAL | Status: DC
  Administered 2021-10-08 – 2021-10-11 (×4): 100 mg via ORAL
  Filled 2021-10-08 (×4): qty 1

## 2021-10-08 MED ORDER — ACETAMINOPHEN 325 MG PO TABS
650.0000 mg | ORAL_TABLET | Freq: Four times a day (QID) | ORAL | Status: DC | PRN
  Administered 2021-10-08 – 2021-10-09 (×2): 650 mg via ORAL
  Filled 2021-10-08 (×2): qty 2

## 2021-10-08 MED ORDER — ENOXAPARIN SODIUM 80 MG/0.8ML IJ SOSY
0.5000 mg/kg | PREFILLED_SYRINGE | INTRAMUSCULAR | Status: DC
  Administered 2021-10-09 – 2021-10-11 (×3): 72.5 mg via SUBCUTANEOUS
  Filled 2021-10-08 (×4): qty 0.72

## 2021-10-08 MED ORDER — SODIUM CHLORIDE 0.9 % IV SOLN
500.0000 mg | INTRAVENOUS | Status: DC
  Administered 2021-10-08 – 2021-10-10 (×3): 500 mg via INTRAVENOUS
  Filled 2021-10-08 (×3): qty 500

## 2021-10-08 MED ORDER — DIVALPROEX SODIUM 500 MG PO DR TAB
500.0000 mg | DELAYED_RELEASE_TABLET | Freq: Two times a day (BID) | ORAL | Status: DC
  Administered 2021-10-08 – 2021-10-11 (×6): 500 mg via ORAL
  Filled 2021-10-08 (×6): qty 1
  Filled 2021-10-08: qty 2
  Filled 2021-10-08: qty 1

## 2021-10-08 NOTE — Assessment & Plan Note (Addendum)
--   Severe, unable to tolerate liquids or medications by mouth, secondary to strep throat, treat as above, continue IV fluids

## 2021-10-08 NOTE — Assessment & Plan Note (Signed)
--   No hypoxia, appears stable, continue empiric antibiotics, changed to oral when able to tolerate diet

## 2021-10-08 NOTE — Assessment & Plan Note (Signed)
--   Recommend outpatient follow-up, diet and weight loss.  Dietitian consultation.

## 2021-10-08 NOTE — ED Notes (Signed)
Ambulated to BR and back to bed unassisted without difficulty

## 2021-10-08 NOTE — H&P (Signed)
History and Physical    AMBER WILLIARD EZM:[id number] DOB: [birth year] DOA: 10/07/2021  PCP: Fleet Contras, MD  Patient coming from: Home  I have personally briefly reviewed patient's old medical records in [facility]  Chief Complaint: Sore throat  HPI: Alexandra Gray is a 30 y.o. female with medical history significant of HTN.  Pt presents to ED with c/o sore throat, productive cough, fever, chills, rhinorrhea.  Symptoms onset 2 days ago, constant, persistent, nothing makes better nor worse.  Not SOB.  No CP, rash, wheezing, headaches.   ED Course: Pt with Tm 100.9, tachy to 109 initially.  No O2 requirement.  WBC 14.8k  Work up in ED reveals: 1) strep throat 2) RUL PNA  Pt started on unasyn.  CT neck shows no abscess.  Unable to tolerate POs due to throat pain so admission requested.   Review of Systems: As per HPI, otherwise all review of systems negative.  Past Medical History:  Diagnosis Date   Chlamydia    Depression    doing ok now   Gestational diabetes    Pt states she does not have DMII   Gonorrhea    History of pregnancy induced hypertension 12/06/2016   Hx of trichomoniasis    Hypertension    sometimes is up, never on meds   Mental disorder    Ovarian cyst    PID (pelvic inflammatory disease)    Sleep apnea     Past Surgical History:  Procedure Laterality Date   CESAREAN SECTION N/A 10/03/2015   Procedure: CESAREAN SECTION;  Surgeon: Tilda Burrow, MD;  Location: [facility];  Service: Obstetrics;  Laterality: N/A;   CESAREAN SECTION N/A 01/04/2019   Procedure: CESAREAN SECTION;  Surgeon: Hermina Staggers, MD;  Location: [facility] BIRTHING SUITES;  Service: Obstetrics;  Laterality: N/A;   CESAREAN SECTION N/A    Phreesia 03/08/2021   KNEE SURGERY       reports that she has never smoked. She has never used smokeless tobacco. She reports that she does not drink alcohol and does not use drugs.  Allergies  Allergen Reactions   Infuvite  Adult [Multiple Vitamin] Shortness Of Breath   Nubain [Nalbuphine Hcl] Other (See Comments)    Makes patient hot    Family History  Problem Relation Age of Onset   Healthy Mother    Healthy Father    Healthy Brother    Cancer Neg Hx    Diabetes Neg Hx    Heart disease Neg Hx    Hypertension Neg Hx    Stroke Neg Hx    Hearing loss Neg Hx      Prior to Admission medications   Medication Sig Start Date End Date Taking? Authorizing Provider  benzonatate (TESSALON) 100 MG capsule Take 1 capsule (100 mg total) by mouth 3 (three) times daily as needed. Patient taking differently: Take 100 mg by mouth 3 (three) times daily as needed for cough. 10/06/21  Yes [name], PA-C  divalproex (DEPAKOTE) 500 MG DR tablet Take 1 tablet (500 mg total) by mouth every 12 (twelve) hours. Patient taking differently: Take 500 mg by mouth at bedtime as needed (sleep). 11/08/20  Yes Thermon Leyland, NP  hydrochlorothiazide (HYDRODIURIL) 12.5 MG tablet Take 12.5 mg by mouth daily. 07/07/21  Yes [provider]  melatonin 3 MG TABS tablet Take 1 tablet (3 mg total) by mouth at bedtime. Patient taking differently: Take 3 mg by mouth at bedtime as needed (sleep).  11/08/20  Yes Thermon Leyland, NP  ondansetron (ZOFRAN) 4 MG tablet Take 1 tablet (4 mg total) by mouth every 8 (eight) hours as needed for nausea or vomiting. 10/06/21  Yes [name], PA-C  sertraline (ZOLOFT) 100 MG tablet Take 1 tablet (100 mg total) by mouth daily. 11/09/20  Yes Thermon Leyland, NP  Vitamin D, Ergocalciferol, (DRISDOL) 1.25 MG (50000 UNIT) CAPS capsule Take 50,000 Units by mouth once a week. On Monday 07/01/21  Yes [provider]    Physical Exam: Vitals:   10/07/21 1953 10/07/21 2100 10/07/21 2315 10/08/21 0030  BP: 140/70 (!) 122/98 128/78 92/67  Pulse: (!) 109 98 84 78  Resp: 18 17 (!) 22 18  Temp:   97.6 F (36.4 C)   TempSrc:   Oral   SpO2: 99% 95% 96% 94%    Constitutional: NAD,  calm, comfortable Eyes: PERRL, lids and conjunctivae normal ENMT: Pharyngeal erythema, edema, R>L tonsil Neck: normal, supple, no masses, no thyromegaly Respiratory: clear to auscultation bilaterally, no wheezing, no crackles. Normal respiratory effort. No accessory muscle use.  Cardiovascular: Regular rate and rhythm, no murmurs / rubs / gallops. No extremity edema. 2+ pedal pulses. No carotid bruits.  Abdomen: no tenderness, no masses palpated. No hepatosplenomegaly. Bowel sounds positive.  Musculoskeletal: no clubbing / cyanosis. No joint deformity upper and lower extremities. Good ROM, no contractures. Normal muscle tone.  Skin: no rashes, lesions, ulcers. No induration Neurologic: CN 2-12 grossly intact. Sensation intact, DTR normal. Strength 5/5 in all 4.  Psychiatric: Normal judgment and insight. Alert and oriented x 3. Normal mood.    Labs on Admission: I have personally reviewed following labs and imaging studies  CBC: Recent Labs  Lab 10/07/21 1314  WBC 14.8*  NEUTROABS 11.5*  HGB 13.9  HCT 43.1  MCV 81.9  PLT 341   Basic Metabolic Panel: Recent Labs  Lab 10/07/21 1314  NA 136  K 3.2*  CL 98  CO2 26  GLUCOSE 110*  BUN 7  CREATININE 0.73  CALCIUM 8.8*   GFR: CrCl cannot be calculated (Unknown ideal weight.). Liver Function Tests: No results for input(s): AST, ALT, ALKPHOS, BILITOT, PROT, ALBUMIN in the last 168 hours. No results for input(s): LIPASE, AMYLASE in the last 168 hours. No results for input(s): AMMONIA in the last 168 hours. Coagulation Profile: No results for input(s): INR, PROTIME in the last 168 hours. Cardiac Enzymes: No results for input(s): CKTOTAL, CKMB, CKMBINDEX, TROPONINI in the last 168 hours. BNP (last 3 results) No results for input(s): PROBNP in the last 8760 hours. HbA1C: No results for input(s): HGBA1C in the last 72 hours. CBG: No results for input(s): GLUCAP in the last 168 hours. Lipid Profile: No results for input(s):  CHOL, HDL, LDLCALC, TRIG, CHOLHDL, LDLDIRECT in the last 72 hours. Thyroid Function Tests: No results for input(s): TSH, T4TOTAL, FREET4, T3FREE, THYROIDAB in the last 72 hours. Anemia Panel: No results for input(s): VITAMINB12, FOLATE, FERRITIN, TIBC, IRON, RETICCTPCT in the last 72 hours. Urine analysis:    Component Value Date/Time   COLORURINE YELLOW 09/21/2021 2114   APPEARANCEUR CLEAR 09/21/2021 2114   LABSPEC 1.021 09/21/2021 2114   PHURINE 6.0 09/21/2021 2114   GLUCOSEU NEGATIVE 09/21/2021 2114   HGBUR NEGATIVE 09/21/2021 2114   BILIRUBINUR NEGATIVE 09/21/2021 2114   KETONESUR NEGATIVE 09/21/2021 2114   PROTEINUR NEGATIVE 09/21/2021 2114   UROBILINOGEN 1.0 12/26/2018 0833   NITRITE NEGATIVE 09/21/2021 2114   LEUKOCYTESUR NEGATIVE 09/21/2021 2114  Radiological Exams on Admission: DG Chest 2 View  Result Date: 10/07/2021 CLINICAL DATA:  Productive cough.  Fever and chills. EXAM: CHEST - 2 VIEW COMPARISON:  Chest radiograph 09/21/2021 FINDINGS: Borderline cardiomegaly with unchanged mediastinal contours. Moderate bronchial thickening. Minimal patchy opacity in the right upper lung zone. No pneumothorax or pleural effusion. No acute osseous abnormalities are seen. IMPRESSION: Moderate bronchial thickening with minimal patchy opacity in the right upper lung zone suspicious for pneumonia. Electronically Signed   By: Narda Rutherford M.D.   On: 10/07/2021 19:17   CT Soft Tissue Neck W Contrast  Result Date: 10/07/2021 CLINICAL DATA:  Concern for deep neck infection or peritonsillar abscess. Severe sore throat. EXAM: CT NECK WITH CONTRAST TECHNIQUE: Multidetector CT imaging of the neck was performed using the standard protocol following the bolus administration of intravenous contrast. CONTRAST:  28mL OMNIPAQUE IOHEXOL 350 MG/ML SOLN COMPARISON:  06/04/2013 FINDINGS: Pharynx and larynx: Mild hypertrophy of the palatine nasopharyngeal adenoidal tissue, without focal low-density  collection to suggest abscess. The parapharyngeal fat is preserved throughout. Salivary glands: No inflammation, mass, or stone. Thyroid: Normal. Lymph nodes: Enlarged bilateral level 2 lymph nodes, the largest of which measures up to 1.6 cm on the right (series 3, image 51), and 1.6 cm on the left (series 3, image 49), likely reactive, as are prominent bilateral level 3 lymph nodes. Prominent level 1A and B lymph nodes appear overall unchanged compared to 06/04/2013, for example a left level 1A lymph node that measures up to 0.6 cm in short axis, previously 0.7 cm. Vascular: Negative. Limited intracranial: Negative. Visualized orbits: Negative. Mastoids and visualized paranasal sinuses: Mucosal thickening in the maxillary sinuses and ethmoid air cells. The mastoids are well aerated. Skeleton: No acute osseous abnormality. Upper chest: Evaluation is somewhat limited by motion artifact; however are nodular opacities in the right upper lobe. No pleural effusion. Other: None. IMPRESSION: 1. Prominent tonsillar tissue without evidence of abscess, likely infectious or inflammatory. 2. Enlarged level 2 and prominent level 1 and 3 lymph nodes, likely reactive. 3. Nodular opacities in the right upper lobe, incompletely evaluated but concerning for infection. Electronically Signed   By: Wiliam Ke M.D.   On: 10/07/2021 18:15    EKG: Independently reviewed.  Assessment/Plan Principal Problem:   CAP (community acquired pneumonia) Active Problems:   Strep pharyngitis    CAP - RUL PNA PNA pathway Empiric unasyn + azithromycin No o2 requirement at this time Strep pharyngitis - On unasyn for PNA as above Tylenol PRN fever IVF Try to advance to clear liquid diet as able Ordering phenol spray for sore throat HTN - Hold HCTZ  DVT prophylaxis: Lovenox Code Status: Full Family Communication: No family in room Disposition Plan: Home after tolerating POs Consults called: None Admission status: Place in  108   Angella Montas M. DO [facility]  How to contact the [facility] Attending or Consulting provider 7A - 7P or covering provider during after hours 7P -7A, for this patient?  Check the care team in [facility] and look for a) attending/consulting [facility] provider listed and b) the [facility] team listed Log into [email or web address]  Amion Physician Scheduling and messaging for groups and whole hospitals  On call and physician scheduling software for group practices, residents, hospitalists and other medical providers for call, clinic, rotation and shift schedules. OnCall [employer] is a hospital-wide system for scheduling doctors and paging doctors on call. EasyPlot is for scientific plotting and data analysis.  [email or web address]  and use [place]'s universal password to  access. If you do not have the password, please contact the hospital operator.  Locate the [facility] provider you are looking for under [facility] and page to a number that you can be directly reached. If you still have difficulty reaching the provider, please page the [facility] (Director on Call) for the Hospitalists listed on amion for assistance.  10/08/2021, 1:48 AM

## 2021-10-08 NOTE — Hospital Course (Addendum)
30 year old woman PMH hypertension presented with sore throat, inability to swallow, productive cough, fever, chills.  Admitted for right upper lobe pneumonia, strep throat, severe odynophagia prohibiting swallow. --11/12 observation for severe strep throat, odynophagia, inability to swallow, pneumonia.  CT no abscess. --11/13 still difficulty swallowing but wants to try some solid food.  If can tolerate, will likely go home tomorrow. --11/14 tolerated some diet this morning but then vomited and feels poorly.  Challenged with lunch, changed to oral medications, if can tolerate discharge home, if cannot tolerate need to switch back to IV.

## 2021-10-08 NOTE — Progress Notes (Signed)
  Progress Note Alexandra Gray   [id number]:[id number]  DOB: [birth year]  DOA: 10/07/2021     0 Date of Service: 10/08/2021   Clinical Course 30 year old woman PMH hypertension presented with sore throat, inability to swallow, productive cough, fever, chills.  Admitted for right upper lobe pneumonia, strep throat, severe odynophagia prohibiting swallow. --11/12 observation for severe strep throat, odynophagia, inability to swallow, pneumonia.  CT no abscess.  Assessment and Plan * Strep pharyngitis -- With severe odynophagia, patient unable to tolerate even liquids, unable to tolerate oral medications -- CT negative for abscess, clinical exam reassuring, marked right enlargement of the tonsil noted, respiratory status appears stable --Continue hydration and IV medications including antibiotics  Odynophagia -- Severe, unable to tolerate liquids or medications by mouth, secondary to strep throat, treat as above  CAP (community acquired pneumonia) -- No hypoxia, appears stable, continue empiric antibiotics, changed to oral when able to tolerate diet  Morbid obesity with BMI of 50.0-59.9, adult (HCC) -- Recommend outpatient follow-up, diet and weight loss.  Dietitian consultation.  Benign essential HTN -- Stable.  Hold hydrochlorothiazide for now   Subjective:  Feels about the same, throat still very sore, unable to tolerate even liquids and sips.  No vomiting.  Breathing okay.  Objective Vitals:   10/08/21 0200 10/08/21 0400 10/08/21 0513 10/08/21 0611  BP: 98/63 117/81 131/78 130/79  Pulse: 79 87 84 76  Resp: 19 18 18 18   Temp:  98.2 F (36.8 C)    TempSrc:      SpO2: 95% 96% 95% 94%      Vital signs were reviewed and unremarkable.  Exam Physical Exam Vitals reviewed.  Constitutional:      General: She is not in acute distress.    Appearance: She is not ill-appearing or toxic-appearing.  HENT:     Mouth/Throat:     Mouth: Mucous membranes are moist. No oral lesions.      Pharynx: No oropharyngeal exudate, posterior oropharyngeal erythema or uvula swelling.     Comments: Marked enlargement right tonsil Cardiovascular:     Rate and Rhythm: Normal rate and regular rhythm.     Heart sounds: No murmur heard. Pulmonary:     Effort: Pulmonary effort is normal. No respiratory distress.     Breath sounds: No stridor. No wheezing or rhonchi.  Neurological:     Mental Status: She is alert.  Psychiatric:        Mood and Affect: Mood normal.        Behavior: Behavior normal.       Labs / Other Information My review of labs, imaging, notes and other tests is significant for Leukocytosis trending down to 11.1.  CBC unremarkable.  PT strep was positive on admission.  Blood cultures are pending.  Admitted for management.     Disposition Plan: Status is: Observation  The patient remains OBS appropriate and will d/c before 2 midnights.  Continue enoxaparin for DVT prophylaxis No family present  Time spent: 20 minutes [facility] 10/08/2021, 8:21 AM

## 2021-10-08 NOTE — Assessment & Plan Note (Signed)
--   Stable.  Hold hydrochlorothiazide for now

## 2021-10-08 NOTE — Progress Notes (Signed)
Pharmacy Antibiotic Note  Alexandra Gray is a 30 y.o. female admitted on 10/07/2021 presenting with cough, fevers/chills.  Pharmacy has been consulted for Unasyn dosing.  Plan: Unasyn 3g IV every 6 hours Monitor renal function, clinical progression, LOT and ability to transition to PO    Temp (24hrs), Avg:99.7 F (37.6 C), Min:97.6 F (36.4 C), Max:100.9 F (38.3 C)  Recent Labs  Lab 10/07/21 1314 10/07/21 1650  WBC 14.8*  --   CREATININE 0.73  --   LATICACIDVEN  --  1.3    CrCl cannot be calculated (Unknown ideal weight.).    Allergies  Allergen Reactions   Infuvite Adult [Multiple Vitamin] Shortness Of Breath   Nubain [Nalbuphine Hcl] Other (See Comments)    Makes patient hot    Daylene Posey, PharmD Clinical Pharmacist ED Pharmacist Phone # [phone] 10/08/2021 1:54 AM

## 2021-10-08 NOTE — Progress Notes (Signed)
Initial Nutrition Assessment  DOCUMENTATION CODES:   Morbid obesity  INTERVENTION:   -Boost Breeze po TID, each supplement provides 250 kcal and 9 grams of protein  -30 ml Prosource Plus TID, each supplement provides 100 kcals and 15 grams protein -MVI with minerals daily -RD will follow for diet advancement and adjust supplement regimen as appropriate  NUTRITION DIAGNOSIS:   Inadequate oral intake related to acute illness (strep phayngitis) as evidenced by meal completion < 50%, per patient/family report.  GOAL:   Patient will meet greater than or equal to 90% of their needs  MONITOR:   PO intake, Supplement acceptance, Diet advancement, Labs, Weight trends, Skin, I & O's  REASON FOR ASSESSMENT:   Consult Assessment of nutrition requirement/status, Diet education  ASSESSMENT:   30 year old woman PMH hypertension presented with sore throat, inability to swallow, productive cough, fever, chills.  Admitted for right upper lobe pneumonia, strep throat, severe odynophagia prohibiting swallow.  Pt admitted with strep pharyngitis.   Reviewed I/O's: +100 ml x 24 hours  Pt unavailable at time of visit. RD unable to obtain further nutrition-related history or complete nutrition-focused physical exam at this time.   Per chart review, pt with painful swallowing secondary to sore throat. She is having a hard time tolerating swallowing pills and liquids. She was just advanced to a clear liquids.    Reviewed wt hx; wt has been stable over the past 6 months.  Obesity is a complex, chronic medical condition that is optimally managed by a multidisciplinary care team. Weight loss is not an ideal goal for an acute inpatient hospitalization. However, if further work-up for obesity is warranted, consider outpatient referral to outpatient bariatric service and/or [facility].    Medications reviewed and include lactated ringer infusion @ 150 ml/hr.    Labs reviewed: Na: 134.    Diet Order:   Diet Order             Diet clear liquid Room service appropriate? Yes; Fluid consistency: Thin  Diet effective now                   EDUCATION NEEDS:   No education needs have been identified at this time  Skin:  Skin Assessment: Reviewed RN Assessment  Last BM:  Unknown  Height:   Ht Readings from Last 1 Encounters:  09/21/21 5' (1.524 m)    Weight:   Wt Readings from Last 1 Encounters:  10/08/21 (!) 143.3 kg    Ideal Body Weight:  45.5 kg  BMI:  Body mass index is 61.7 kg/m.  Estimated Nutritional Needs:   Kcal:  1800-2000  Protein:  100-115 grams  Fluid:  > 1.8 L    Levada Schilling, RD, LDN, CDCES Registered Dietitian II Certified Diabetes Care and Education Specialist Please refer to [facility] for RD and/or RD on-call/weekend/after hours pager

## 2021-10-08 NOTE — ED Notes (Signed)
Per pharmacist Jonny Ruiz, OK to infuse azithromycin and LR concurrently

## 2021-10-08 NOTE — Plan of Care (Signed)
  Problem: Education: Goal: Knowledge of General Education information will improve Description: Including pain rating scale, medication(s)/side effects and non-pharmacologic comfort measures Outcome: Progressing   Problem: Health Behavior/Discharge Planning: Goal: Ability to manage health-related needs will improve Outcome: Progressing   Problem: Clinical Measurements: Goal: Ability to maintain clinical measurements within normal limits will improve Outcome: Progressing   Problem: Clinical Measurements: Goal: Will remain free from infection Outcome: Progressing   Problem: Clinical Measurements: Goal: Diagnostic test results will improve Outcome: Progressing   Problem: Clinical Measurements: Goal: Cardiovascular complication will be avoided Outcome: Progressing   Problem: Activity: Goal: Risk for activity intolerance will decrease Outcome: Progressing   Problem: Nutrition: Goal: Adequate nutrition will be maintained Outcome: Progressing   Problem: Pain Managment: Goal: General experience of comfort will improve Outcome: Progressing   Problem: Safety: Goal: Ability to remain free from injury will improve Outcome: Progressing   Problem: Skin Integrity: Goal: Risk for impaired skin integrity will decrease Outcome: Progressing

## 2021-10-08 NOTE — ED Notes (Signed)
PT at bedside.

## 2021-10-08 NOTE — Assessment & Plan Note (Signed)
--   With severe odynophagia, patient unable to tolerate even liquids, unable to tolerate oral medications -- CT negative for abscess, clinical exam reassuring, marked right enlargement of the tonsil noted, respiratory status appears stable --Continue hydration and IV medications including antibiotics

## 2021-10-09 DIAGNOSIS — Z6841 Body Mass Index (BMI) 40.0 and over, adult: Secondary | ICD-10-CM

## 2021-10-09 NOTE — Assessment & Plan Note (Signed)
--   Recommend outpatient follow-up, diet and weight loss.  Dietitian consultation appreciated.

## 2021-10-09 NOTE — Assessment & Plan Note (Addendum)
--   Treated with empiric antibiotics with clinical improvement, followed short of breath earlier today, got a breathing treatment with improvement.  No hypoxia.  I ambulated the patient on room air, sats 100%.

## 2021-10-09 NOTE — Assessment & Plan Note (Addendum)
--   Secondary to strep pharyngitis.  Improving with treatment.

## 2021-10-09 NOTE — Progress Notes (Signed)
  Progress Note Alexandra Gray   [id number]  DOB: [birth year]  DOA: 10/07/2021     1 Date of Service: 10/09/2021   Clinical Course 30 year old woman PMH hypertension presented with sore throat, inability to swallow, productive cough, fever, chills.  Admitted for right upper lobe pneumonia, strep throat, severe odynophagia prohibiting swallow. --11/12 observation for severe strep throat, odynophagia, inability to swallow, pneumonia.  CT no abscess. --11/13 still difficulty swallowing but wants to try some solid food.  If can tolerate, will likely go home tomorrow.  Assessment and Plan * Strep pharyngitis -- With severe odynophagia, which is a little better today. -- CT negative for abscess, clinical exam reassuring, marked right enlargement of the tonsil noted, respiratory status appears stable --Continue hydration and IV medications including antibiotics  Odynophagia -- Secondary to strep pharyngitis.  Still severe but able to take some sips of liquids now.  Asking for some crackers.  Try soft diet.  CAP (community acquired pneumonia) -- No hypoxia, appears stable, continue empiric antibiotics, changed to oral when able to tolerate diet  Morbid obesity with BMI of 60.0-69.9, adult (HCC) -- Recommend outpatient follow-up, diet and weight loss.  Dietitian consultation appreciated.  Benign essential HTN -- Stable.  Hold hydrochlorothiazide for now   Subjective:  Feels a little better today, able to take some sips of liquids but still has severe sore throat.  Wants to try some crackers.  Still coughing.  Objective Vitals:   10/08/21 1758 10/08/21 1822 10/09/21 0512 10/09/21 0800  BP: 132/90  (!) 151/93 115/79  Pulse: 80  82 75  Resp: 16  18   Temp:  98.7 F (37.1 C) 97.9 F (36.6 C) 98.3 F (36.8 C)  TempSrc:  Oral  Oral  SpO2: 99%  99% 99%  Weight:  (!) 143.3 kg    Height:  5' (1.524 m)     (!) 143.3 kg  Vital signs were reviewed and  unremarkable.  Exam Physical Exam Vitals reviewed.  Constitutional:      General: She is not in acute distress.    Appearance: She is not ill-appearing.  Cardiovascular:     Rate and Rhythm: Normal rate and regular rhythm.     Heart sounds: No murmur heard. Pulmonary:     Effort: Pulmonary effort is normal. No respiratory distress.     Breath sounds: No stridor. No wheezing, rhonchi or rales.  Neurological:     Mental Status: She is alert.  Psychiatric:        Mood and Affect: Mood normal.        Behavior: Behavior normal.   Mouth appears unremarkable, unable to visualize oropharynx.  Labs / Other Information My review of labs, imaging, notes and other tests is significant for [facility] no growth to date    Disposition Plan: Status is: Inpatient  Remains inpatient appropriate because: Severe odynophagia, trial of advance diet to see if she can tolerate oral medications  Enoxaparin No family present  Time spent: 20 minutes [facility] 10/09/2021, 3:08 PM

## 2021-10-09 NOTE — Assessment & Plan Note (Addendum)
--   Stable.  Can resume hydrochlorothiazide on discharge.

## 2021-10-09 NOTE — Assessment & Plan Note (Addendum)
--   With severe odynophagia preventing immediate discharge home as patient was unable to tolerate medications. -- CT negative for abscess, clinical exam reassuring, marked right enlargement of the tonsil noted --Afebrile, blood cultures no growth today, clinically improving, able to tolerate lunch, physical exam improved.  Discharged home with oral antibiotics.

## 2021-10-10 MED ORDER — AMOXICILLIN-POT CLAVULANATE 875-125 MG PO TABS
1.0000 | ORAL_TABLET | Freq: Two times a day (BID) | ORAL | Status: DC
  Administered 2021-10-10 – 2021-10-11 (×3): 1 via ORAL
  Filled 2021-10-10 (×3): qty 1

## 2021-10-10 MED ORDER — ONDANSETRON HCL 4 MG/2ML IJ SOLN
4.0000 mg | Freq: Four times a day (QID) | INTRAMUSCULAR | Status: DC | PRN
  Administered 2021-10-10 – 2021-10-11 (×2): 4 mg via INTRAVENOUS
  Filled 2021-10-10 (×3): qty 2

## 2021-10-10 MED ORDER — AZITHROMYCIN 500 MG PO TABS
500.0000 mg | ORAL_TABLET | Freq: Every day | ORAL | Status: DC
  Administered 2021-10-11: 500 mg via ORAL
  Filled 2021-10-10: qty 1

## 2021-10-10 NOTE — Progress Notes (Signed)
  Progress Note Alexandra Gray   YSA:[id number]  DOB: 1991-11-19  DOA: 10/07/2021     2 Date of Service: 10/10/2021   Clinical Course 30 year old woman PMH hypertension presented with sore throat, inability to swallow, productive cough, fever, chills.  Admitted for right upper lobe pneumonia, strep throat, severe odynophagia prohibiting swallow. --11/12 observation for severe strep throat, odynophagia, inability to swallow, pneumonia.  CT no abscess. --11/13 still difficulty swallowing but wants to try some solid food.  If can tolerate, will likely go home tomorrow. --11/14 tolerated some diet this morning but then vomited and feels poorly.  Challenged with lunch, changed to oral medications, if can tolerate discharge home, if cannot tolerate need to switch back to IV.  Assessment and Plan * Strep pharyngitis -- With severe odynophagia, better today. Was able to eat a bit last night and this morning, but then vomited. -- CT negative for abscess, clinical exam reassuring, marked right enlargement of the tonsil noted, respiratory status appears stable -- Try lunch today and observe.  If able to tolerate a diet and can take oral medications, anticipate discharge.  Tolerating other oral medications.  Odynophagia -- Secondary to strep pharyngitis.  Tolerated some soft diet last evening and this morning although did vomit today.  CAP (community acquired pneumonia) -- No hypoxia, appears stable, breathing okay.  Change to oral antibiotics and observe for tolerance.  Morbid obesity with BMI of 60.0-69.9, adult (HCC) -- Recommend outpatient follow-up, diet and weight loss.  Dietitian consultation appreciated.  Benign essential HTN -- Stable.  Can resume hydrochlorothiazide on discharge.   Subjective:  Was able to eat some last night and this morning but then vomited and feels poorly now.  Sore throat improving, able to swallow okay, breathing okay.  Objective Vitals:   10/09/21 0800  10/09/21 1944 10/10/21 0508 10/10/21 0735  BP: 115/79 115/80 (!) 124/98 (!) 145/102  Pulse: 75 72 79 82  Resp:  18 16 17   Temp: 98.3 F (36.8 C) 98 F (36.7 C)  97.6 F (36.4 C)  TempSrc: Oral   Oral  SpO2: 99% 99% 99% 100%  Weight:      Height:       (!) 143.3 kg  Vital signs were reviewed and unremarkable.  Exam Physical Exam Constitutional:      General: She is not in acute distress.    Appearance: She is not ill-appearing.  HENT:     Mouth/Throat:     Mouth: Mucous membranes are moist. No oral lesions.     Pharynx: No oropharyngeal exudate, posterior oropharyngeal erythema or uvula swelling.     Comments: Tonsillar swelling decreased. Cardiovascular:     Rate and Rhythm: Normal rate and regular rhythm.     Heart sounds: No murmur heard. Neurological:     Mental Status: She is alert.  Psychiatric:        Mood and Affect: Mood normal.        Behavior: Behavior normal.    Labs / Other Information My review of labs, imaging, notes and other tests is significant for blood cultures no growth to date    Disposition Plan: Status is: Inpatient  Remains inpatient appropriate because: Vomited today, need to ensure can tolerate diet and oral antibiotics.  Enoxaparin No family present   Time spent: 20 minutes [facility] 10/10/2021, 2:42 PM

## 2021-10-10 NOTE — Plan of Care (Signed)

## 2021-10-11 MED ORDER — DIVALPROEX SODIUM 500 MG PO DR TAB: 500.0000 mg | DELAYED_RELEASE_TABLET | Freq: Every evening | ORAL | Status: DC | PRN

## 2021-10-11 MED ORDER — AZITHROMYCIN 500 MG PO TABS: 500.0000 mg | ORAL_TABLET | Freq: Every day | ORAL | 0 refills | Status: AC

## 2021-10-11 MED ORDER — ONDANSETRON HCL 4 MG PO TABS: 4.0000 mg | ORAL_TABLET | Freq: Three times a day (TID) | ORAL | 0 refills | Status: DC | PRN

## 2021-10-11 MED ORDER — AMOXICILLIN-POT CLAVULANATE 875-125 MG PO TABS: 1.0000 | ORAL_TABLET | Freq: Two times a day (BID) | ORAL | 0 refills | Status: AC

## 2021-10-11 MED ORDER — ALBUTEROL SULFATE HFA 108 (90 BASE) MCG/ACT IN AERS
2.0000 | INHALATION_SPRAY | Freq: Four times a day (QID) | RESPIRATORY_TRACT | 0 refills | Status: DC | PRN
Start: 2021-10-11 — End: 2022-03-04

## 2021-10-11 MED ORDER — ALBUTEROL SULFATE (2.5 MG/3ML) 0.083% IN NEBU
2.5000 mg | INHALATION_SOLUTION | RESPIRATORY_TRACT | Status: DC | PRN
  Administered 2021-10-11: 2.5 mg via RESPIRATORY_TRACT
  Filled 2021-10-11: qty 3

## 2021-10-11 NOTE — Progress Notes (Signed)
Discharge instructions addressed;Pt in stable condition; Pt 's mom picked her up at the [employer] entrance.

## 2021-10-11 NOTE — Discharge Summary (Signed)
Physician Discharge Summary   Patient name: Alexandra Gray  Admit date:     10/07/2021  Discharge date: 10/11/2021  Discharge Physician: Brendia Sacks   PCP: Fleet Contras, MD   Recommendations at discharge:  Morbid obesity, recommend weight loss  Discharge Diagnoses Principal Problem:   Strep pharyngitis Active Problems:   CAP (community acquired pneumonia)   Odynophagia   Morbid obesity with BMI of 60.0-69.9, adult (HCC)   Benign essential HTN  Hospital Course   30 year old woman PMH hypertension presented with sore throat, inability to swallow, productive cough, fever, chills.  Admitted for right upper lobe pneumonia, strep throat, severe odynophagia prohibiting swallow. --11/12 observation for severe strep throat, odynophagia, inability to swallow, pneumonia.  CT no abscess. --11/13 still difficulty swallowing but wants to try some solid food.  If can tolerate, will likely go home tomorrow. --11/14 tolerated some diet this morning but then vomited and feels poorly.   -- 11/15 tolerated lunch, discharged home  * Strep pharyngitis -- With severe odynophagia preventing immediate discharge home as patient was unable to tolerate medications. -- CT negative for abscess, clinical exam reassuring, marked right enlargement of the tonsil noted --Afebrile, blood cultures no growth today, clinically improving, able to tolerate lunch, physical exam improved.  Discharged home with oral antibiotics.  Odynophagia -- Secondary to strep pharyngitis.  Improving with treatment.  CAP (community acquired pneumonia) -- Treated with empiric antibiotics with clinical improvement, followed short of breath earlier today, got a breathing treatment with improvement.  No hypoxia.  I ambulated the patient on room air, sats 100%.  Morbid obesity with BMI of 60.0-69.9, adult (HCC) -- Recommend outpatient follow-up, diet and weight loss.  Dietitian consultation appreciated.  Benign essential  HTN -- Stable.  Can resume hydrochlorothiazide on discharge.   Procedures performed: none   Condition at discharge: good  Feels better, tolerated lunch clueless pain with swallowing. Exam Physical Exam Constitutional:      General: She is not in acute distress.    Appearance: She is not ill-appearing.  HENT:     Mouth/Throat:     Mouth: No oral lesions.     Pharynx: No pharyngeal swelling, oropharyngeal exudate or posterior oropharyngeal erythema.     Tonsils: No tonsillar exudate or tonsillar abscesses.  Cardiovascular:     Rate and Rhythm: Normal rate and regular rhythm.     Heart sounds: No murmur heard. Pulmonary:     Effort: Pulmonary effort is normal. No respiratory distress.     Breath sounds: No wheezing, rhonchi or rales.  Abdominal:     Palpations: Abdomen is soft.  Neurological:     Mental Status: She is alert.  Psychiatric:        Mood and Affect: Mood normal.        Behavior: Behavior normal.    Disposition: Home  Discharge time: less than 30 minutes.  Follow-up Information     Fleet Contras, MD Follow up in 1 week(s).   Specialty: Internal Medicine Contact information: [address] [place] [address] [phone]                 Allergies as of 10/11/2021       Reactions   Infuvite Adult [multiple Vitamin] Shortness Of Breath   Nubain [nalbuphine Hcl] Other (See Comments)   Makes patient hot        Medication List     STOP taking these medications    Vitamin D (Ergocalciferol) 1.25 MG (50000 UNIT) Caps capsule  Commonly known as: DRISDOL       TAKE these medications    albuterol 108 (90 Base) MCG/ACT inhaler Commonly known as: VENTOLIN HFA Inhale 2 puffs into the lungs every 6 (six) hours as needed for wheezing or shortness of breath. Please instruct in technique and dispense with spacer   amoxicillin-clavulanate 875-125 MG tablet Commonly known as: AUGMENTIN Take 1 tablet by mouth every 12 (twelve) hours for 7  days.   azithromycin 500 MG tablet Commonly known as: ZITHROMAX Take 1 tablet (500 mg total) by mouth daily for 2 days. Start 11/16 in AM Start taking on: October 12, 2021   benzonatate 100 MG capsule Commonly known as: TESSALON Take 1 capsule (100 mg total) by mouth 3 (three) times daily as needed. What changed: reasons to take this   divalproex 500 MG DR tablet Commonly known as: DEPAKOTE Take 1 tablet (500 mg total) by mouth at bedtime as needed (sleep).   hydrochlorothiazide 12.5 MG tablet Commonly known as: HYDRODIURIL Take 12.5 mg by mouth daily.   melatonin 3 MG Tabs tablet Take 1 tablet (3 mg total) by mouth at bedtime. What changed:  when to take this reasons to take this   ondansetron 4 MG tablet Commonly known as: Zofran Take 1 tablet (4 mg total) by mouth every 8 (eight) hours as needed for nausea or vomiting.   sertraline 100 MG tablet Commonly known as: ZOLOFT Take 1 tablet (100 mg total) by mouth daily.        DG Chest 2 View  Result Date: 10/07/2021 CLINICAL DATA:  Productive cough.  Fever and chills. EXAM: CHEST - 2 VIEW COMPARISON:  Chest radiograph 09/21/2021 FINDINGS: Borderline cardiomegaly with unchanged mediastinal contours. Moderate bronchial thickening. Minimal patchy opacity in the right upper lung zone. No pneumothorax or pleural effusion. No acute osseous abnormalities are seen. IMPRESSION: Moderate bronchial thickening with minimal patchy opacity in the right upper lung zone suspicious for pneumonia. Electronically Signed   By: Narda Rutherford M.D.   On: 10/07/2021 19:17   CT Soft Tissue Neck W Contrast  Result Date: 10/07/2021 CLINICAL DATA:  Concern for deep neck infection or peritonsillar abscess. Severe sore throat. EXAM: CT NECK WITH CONTRAST TECHNIQUE: Multidetector CT imaging of the neck was performed using the standard protocol following the bolus administration of intravenous contrast. CONTRAST:  66mL OMNIPAQUE IOHEXOL 350 MG/ML  SOLN COMPARISON:  06/04/2013 FINDINGS: Pharynx and larynx: Mild hypertrophy of the palatine nasopharyngeal adenoidal tissue, without focal low-density collection to suggest abscess. The parapharyngeal fat is preserved throughout. Salivary glands: No inflammation, mass, or stone. Thyroid: Normal. Lymph nodes: Enlarged bilateral level 2 lymph nodes, the largest of which measures up to 1.6 cm on the right (series 3, image 51), and 1.6 cm on the left (series 3, image 49), likely reactive, as are prominent bilateral level 3 lymph nodes. Prominent level 1A and B lymph nodes appear overall unchanged compared to 06/04/2013, for example a left level 1A lymph node that measures up to 0.6 cm in short axis, previously 0.7 cm. Vascular: Negative. Limited intracranial: Negative. Visualized orbits: Negative. Mastoids and visualized paranasal sinuses: Mucosal thickening in the maxillary sinuses and ethmoid air cells. The mastoids are well aerated. Skeleton: No acute osseous abnormality. Upper chest: Evaluation is somewhat limited by motion artifact; however are nodular opacities in the right upper lobe. No pleural effusion. Other: None. IMPRESSION: 1. Prominent tonsillar tissue without evidence of abscess, likely infectious or inflammatory. 2. Enlarged level 2 and prominent level 1 and  3 lymph nodes, likely reactive. 3. Nodular opacities in the right upper lobe, incompletely evaluated but concerning for infection. Electronically Signed   By: Wiliam Ke M.D.   On: 10/07/2021 18:15   DG Chest Port 1 View  Result Date: 09/21/2021 CLINICAL DATA:  Altered mental status. EXAM: PORTABLE CHEST 1 VIEW COMPARISON:  November 14, 2020 FINDINGS: The heart size and mediastinal contours are within normal limits. Low lung volumes are noted. Both lungs are clear. The visualized skeletal structures are unremarkable. IMPRESSION: No active disease. Electronically Signed   By: Aram Candela M.D.   On: 09/21/2021 20:51   Results for orders  placed or performed during the hospital encounter of 10/07/21  Resp Panel by RT-PCR (Flu A&B, Covid) Nasopharyngeal Swab     Status: None   Collection Time: 10/07/21  1:09 PM   Specimen: Nasopharyngeal Swab; Nasopharyngeal(NP) swabs in vial transport medium  Result Value Ref Range Status   SARS Coronavirus 2 by RT PCR NEGATIVE NEGATIVE Final    Comment: (NOTE) SARS-CoV-2 target nucleic acids are NOT DETECTED.  The SARS-CoV-2 RNA is generally detectable in upper respiratory specimens during the acute phase of infection. The lowest concentration of SARS-CoV-2 viral copies this assay can detect is 138 copies/mL. A negative result does not preclude SARS-Cov-2 infection and should not be used as the sole basis for treatment or other patient management decisions. A negative result may occur with  improper specimen collection/handling, submission of specimen other than nasopharyngeal swab, presence of viral mutation(s) within the areas targeted by this assay, and inadequate number of viral copies(<138 copies/mL). A negative result must be combined with clinical observations, patient history, and epidemiological information. The expected result is Negative.  Fact Sheet for Patients:  BloggerCourse.com  Fact Sheet for Healthcare Providers:  SeriousBroker.it  This test is no t yet approved or cleared by the [place] FDA and  has been authorized for detection and/or diagnosis of SARS-CoV-2 by FDA under an Emergency Use Authorization (EUA). This EUA will remain  in effect (meaning this test can be used) for the duration of the COVID-19 declaration under Section 564(b)(1) of the Act, 21 U.S.C.section 360bbb-3(b)(1), unless the authorization is terminated  or revoked sooner.       Influenza A by PCR NEGATIVE NEGATIVE Final   Influenza B by PCR NEGATIVE NEGATIVE Final    Comment: (NOTE) The Xpert Xpress SARS-CoV-2/FLU/RSV plus assay is  intended as an aid in the diagnosis of influenza from Nasopharyngeal swab specimens and should not be used as a sole basis for treatment. Nasal washings and aspirates are unacceptable for Xpert Xpress SARS-CoV-2/FLU/RSV testing.  Fact Sheet for Patients: BloggerCourse.com  Fact Sheet for Healthcare Providers: SeriousBroker.it  This test is not yet approved or cleared by the [place] FDA and has been authorized for detection and/or diagnosis of SARS-CoV-2 by FDA under an Emergency Use Authorization (EUA). This EUA will remain in effect (meaning this test can be used) for the duration of the COVID-19 declaration under Section 564(b)(1) of the Act, 21 U.S.C. section 360bbb-3(b)(1), unless the authorization is terminated or revoked.  Performed at [facility], [address] [place], [place] [address]   Group A Strep by PCR     Status: Abnormal   Collection Time: 10/07/21  1:09 PM   Specimen: Nasopharyngeal Swab; Sterile Swab  Result Value Ref Range Status   Group A Strep by PCR DETECTED (A) NOT DETECTED Final    Comment: Performed at [facility],  [address] [place], [place] [address]  Blood culture (routine x 2)     Status: None (Preliminary result)   Collection Time: 10/07/21  4:50 PM   Specimen: BLOOD  Result Value Ref Range Status   Specimen Description BLOOD RIGHT ANTECUBITAL  Final   Special Requests   Final    BOTTLES DRAWN AEROBIC AND ANAEROBIC Blood Culture adequate volume   Culture   Final    NO GROWTH 4 DAYS Performed at [facility], [address], [place], [place] [address]    Report Status PENDING  Incomplete  Blood culture (routine x 2)     Status: None (Preliminary result)   Collection Time: 10/07/21  4:56 PM   Specimen: BLOOD LEFT HAND  Result Value Ref Range Status   Specimen Description BLOOD LEFT HAND  Final   Special Requests   Final    BOTTLES DRAWN AEROBIC AND ANAEROBIC  Blood Culture adequate volume   Culture   Final    NO GROWTH 4 DAYS Performed at [facility], [address] [place], [place] [address]    Report Status PENDING  Incomplete    Signed:  Brendia Sacks MD.  [facility] 10/11/2021, 4:27 PM

## 2021-10-11 NOTE — Progress Notes (Signed)
No vomiting overnight. Pt c/o mild nausea after eating dinner, but refused Zofran.

## 2021-10-12 LAB — CULTURE, BLOOD (ROUTINE X 2)
Culture: NO GROWTH
Culture: NO GROWTH
Special Requests: ADEQUATE
Special Requests: ADEQUATE

## 2021-10-17 ENCOUNTER — Telehealth: Payer: Medicaid Other | Admitting: Family Medicine

## 2021-10-17 NOTE — Progress Notes (Signed)
[place]- patient needs in person evaluation  Recent hospitalization for CAP and strep- might need xray for possible worsening PNA.  No showed

## 2021-10-18 ENCOUNTER — Telehealth: Payer: Medicaid Other | Admitting: Family Medicine

## 2021-10-18 DIAGNOSIS — R062 Wheezing: Secondary | ICD-10-CM

## 2021-10-18 NOTE — Progress Notes (Signed)
Referred to be seen in person- pain with deep breathing, going down body per her, not able to get mucus up -trouble with coughing spasms and wheezing. Does not feel better since coming home from the hospital.   She is encouraged to go back to ED for possible worsening PNA  Patient acknowledged agreement and understanding of the plan.

## 2021-11-27 ENCOUNTER — Telehealth: Payer: Medicaid Other | Admitting: Emergency Medicine

## 2021-11-27 DIAGNOSIS — J40 Bronchitis, not specified as acute or chronic: Secondary | ICD-10-CM

## 2021-11-27 MED ORDER — PREDNISONE 10 MG (21) PO TBPK: ORAL_TABLET | Freq: Every day | ORAL | 0 refills | Status: DC

## 2021-11-27 MED ORDER — BENZONATATE 100 MG PO CAPS: 100.0000 mg | ORAL_CAPSULE | Freq: Two times a day (BID) | ORAL | 0 refills | Status: DC | PRN

## 2021-11-27 NOTE — Progress Notes (Signed)
We are sorry that you are not feeling well.  Here is how we plan to help!  Based on your presentation I believe you most likely have A cough due to a virus.  This is called viral bronchitis and is best treated by rest, plenty of fluids and control of the cough.  You may use Ibuprofen or Tylenol as directed to help your symptoms.     In addition you may use A prescription cough medication called Tessalon Perles 100mg . You may take 1-2 capsules every 8 hours as needed for your cough.  Prednisone 10 mg daily for 6 days (see taper instructions below)  Directions for 6 day taper: Day 1: 2 tablets before breakfast, 1 after both lunch & dinner and 2 at bedtime Day 2: 1 tab before breakfast, 1 after both lunch & dinner and 2 at bedtime Day 3: 1 tab at each meal & 1 at bedtime Day 4: 1 tab at breakfast, 1 at lunch, 1 at bedtime Day 5: 1 tab at breakfast & 1 tab at bedtime Day 6: 1 tab at breakfast  Please seek in person evaluation in the next day or two if symptoms do not improve with above medications.  Call 911, or go to the emergency room if you experience chest pain, chest discomfort/ tightness not related to cough, shortness of breath, trouble breathing, fever, worsening cough, etc...  From your responses in the eVisit questionnaire you describe inflammation in the upper respiratory tract which is causing a significant cough.  This is commonly called Bronchitis and has four common causes:   Allergies Viral Infections Acid Reflux Bacterial Infection Allergies, viruses and acid reflux are treated by controlling symptoms or eliminating the cause. An example might be a cough caused by taking certain blood pressure medications. You stop the cough by changing the medication. Another example might be a cough caused by acid reflux. Controlling the reflux helps control the cough.  USE OF BRONCHODILATOR ("RESCUE") INHALERS: There is a risk from using your bronchodilator too frequently.  The risk is that  over-reliance on a medication which only relaxes the muscles surrounding the breathing tubes can reduce the effectiveness of medications prescribed to reduce swelling and congestion of the tubes themselves.  Although you feel brief relief from the bronchodilator inhaler, your asthma may actually be worsening with the tubes becoming more swollen and filled with mucus.  This can delay other crucial treatments, such as oral steroid medications. If you need to use a bronchodilator inhaler daily, several times per day, you should discuss this with your provider.  There are probably better treatments that could be used to keep your asthma under control.     HOME CARE Only take medications as instructed by your medical team. Complete the entire course of an antibiotic. Drink plenty of fluids and get plenty of rest. Avoid close contacts especially the very young and the elderly Cover your mouth if you cough or cough into your sleeve. Always remember to wash your hands A steam or ultrasonic humidifier can help congestion.   GET HELP RIGHT AWAY IF: You develop worsening fever. You become short of breath You cough up blood. Your symptoms persist after you have completed your treatment plan MAKE SURE YOU  Understand these instructions. Will watch your condition. Will get help right away if you are not doing well or get worse.    Thank you for choosing an e-visit.  Your e-visit answers were reviewed by a board certified advanced clinical practitioner to  complete your personal care plan. Depending upon the condition, your plan could have included both over the counter or prescription medications.  Please review your pharmacy choice. Make sure the pharmacy is open so you can pick up prescription now. If there is a problem, you may contact your provider through [employer] and have the prescription routed to another pharmacy.  Your safety is important to [place]. If you have drug allergies check your  prescription carefully.   For the next 24 hours you can use MyChart to ask questions about today's visit, request a non-urgent call back, or ask for a work or school excuse. You will get an email in the next two days asking about your experience. I hope that your e-visit has been valuable and will speed your recovery.

## 2021-11-27 NOTE — Progress Notes (Signed)
I have spent 5 minutes in review of e-visit questionnaire, review and updating patient chart, medical decision making and response to patient.   Vee Bahe, PA-C    

## 2021-12-04 ENCOUNTER — Emergency Department (HOSPITAL_COMMUNITY): Payer: Medicaid Other

## 2021-12-04 ENCOUNTER — Emergency Department (HOSPITAL_COMMUNITY)
Admission: EM | Admit: 2021-12-04 | Discharge: 2021-12-06 | Disposition: A | Discharge status: Home / Self Care | Payer: Medicaid Other | Attending: Emergency Medicine | Admitting: Emergency Medicine

## 2021-12-04 ENCOUNTER — Other Ambulatory Visit: Payer: Self-pay

## 2021-12-04 ENCOUNTER — Encounter (HOSPITAL_COMMUNITY): Payer: Self-pay | Admitting: Emergency Medicine

## 2021-12-04 DIAGNOSIS — Z20822 Contact with and (suspected) exposure to covid-19: Secondary | ICD-10-CM | POA: Insufficient documentation

## 2021-12-04 DIAGNOSIS — F3175 Bipolar disorder, in partial remission, most recent episode depressed: Secondary | ICD-10-CM

## 2021-12-04 DIAGNOSIS — F329 Major depressive disorder, single episode, unspecified: Secondary | ICD-10-CM | POA: Insufficient documentation

## 2021-12-04 DIAGNOSIS — F32A Depression, unspecified: Secondary | ICD-10-CM | POA: Diagnosis present

## 2021-12-04 DIAGNOSIS — I10 Essential (primary) hypertension: Secondary | ICD-10-CM | POA: Insufficient documentation

## 2021-12-04 DIAGNOSIS — N9489 Other specified conditions associated with female genital organs and menstrual cycle: Secondary | ICD-10-CM | POA: Insufficient documentation

## 2021-12-04 DIAGNOSIS — J020 Streptococcal pharyngitis: Secondary | ICD-10-CM | POA: Diagnosis not present

## 2021-12-04 DIAGNOSIS — Z79899 Other long term (current) drug therapy: Secondary | ICD-10-CM | POA: Diagnosis not present

## 2021-12-04 DIAGNOSIS — R079 Chest pain, unspecified: Secondary | ICD-10-CM | POA: Diagnosis not present

## 2021-12-04 DIAGNOSIS — F29 Unspecified psychosis not due to a substance or known physiological condition: Secondary | ICD-10-CM | POA: Diagnosis not present

## 2021-12-04 DIAGNOSIS — F332 Major depressive disorder, recurrent severe without psychotic features: Secondary | ICD-10-CM

## 2021-12-04 DIAGNOSIS — R45851 Suicidal ideations: Secondary | ICD-10-CM

## 2021-12-04 LAB — CBC WITH DIFFERENTIAL/PLATELET
Abs Immature Granulocytes: 0.04 10*3/uL (ref 0.00–0.07)
Basophils Absolute: 0 10*3/uL (ref 0.0–0.1)
Basophils Relative: 0 %
Eosinophils Absolute: 0.1 10*3/uL (ref 0.0–0.5)
Eosinophils Relative: 1 %
HCT: 41.7 % (ref 36.0–46.0)
Hemoglobin: 13.5 g/dL (ref 12.0–15.0)
Immature Granulocytes: 0 %
Lymphocytes Relative: 18 %
Lymphs Abs: 2 10*3/uL (ref 0.7–4.0)
MCH: 27.1 pg (ref 26.0–34.0)
MCHC: 32.4 g/dL (ref 30.0–36.0)
MCV: 83.6 fL (ref 80.0–100.0)
Monocytes Absolute: 0.6 10*3/uL (ref 0.1–1.0)
Monocytes Relative: 5 %
Neutro Abs: 8.5 10*3/uL — ABNORMAL HIGH (ref 1.7–7.7)
Neutrophils Relative %: 76 %
Platelets: 271 10*3/uL (ref 150–400)
RBC: 4.99 MIL/uL (ref 3.87–5.11)
RDW: 14.1 % (ref 11.5–15.5)
WBC: 11.3 10*3/uL — ABNORMAL HIGH (ref 4.0–10.5)
nRBC: 0 % (ref 0.0–0.2)

## 2021-12-04 LAB — COMPREHENSIVE METABOLIC PANEL
ALT: 16 U/L (ref 0–44)
AST: 15 U/L (ref 15–41)
Albumin: 3.6 g/dL (ref 3.5–5.0)
Alkaline Phosphatase: 88 U/L (ref 38–126)
Anion gap: 11 (ref 5–15)
BUN: 8 mg/dL (ref 6–20)
CO2: 28 mmol/L (ref 22–32)
Calcium: 9 mg/dL (ref 8.9–10.3)
Chloride: 100 mmol/L (ref 98–111)
Creatinine, Ser: 0.68 mg/dL (ref 0.44–1.00)
GFR, Estimated: >60 mL/min (ref >60)
Glucose, Bld: 84 mg/dL (ref 70–99)
Potassium: 3.5 mmol/L (ref 3.5–5.1)
Sodium: 139 mmol/L (ref 135–145)
Total Bilirubin: 0.7 mg/dL (ref 0.3–1.2)
Total Protein: 7.3 g/dL (ref 6.5–8.1)

## 2021-12-04 LAB — RESP PANEL BY RT-PCR (FLU A&B, COVID) ARPGX2
Influenza A by PCR: NEGATIVE
Influenza B by PCR: NEGATIVE
SARS Coronavirus 2 by RT PCR: NEGATIVE

## 2021-12-04 LAB — ETHANOL: Alcohol, Ethyl (B): <10 mg/dL (ref <10)

## 2021-12-04 LAB — I-STAT BETA HCG BLOOD, ED (MC, WL, AP ONLY): I-stat hCG, quantitative: <5 m[IU]/mL (ref <5)

## 2021-12-04 LAB — GROUP A STREP BY PCR: Group A Strep by PCR: DETECTED — AB

## 2021-12-04 IMAGING — CR DG CHEST 2V
2 series · 2 of 2 positions shown · non-contrast
Comparison: Chest x-ray dated [DATE].

CLINICAL DATA: Cough, sore throat, dysphagia for the past few days.

EXAM:
CHEST - 2 VIEW

[chest pa]
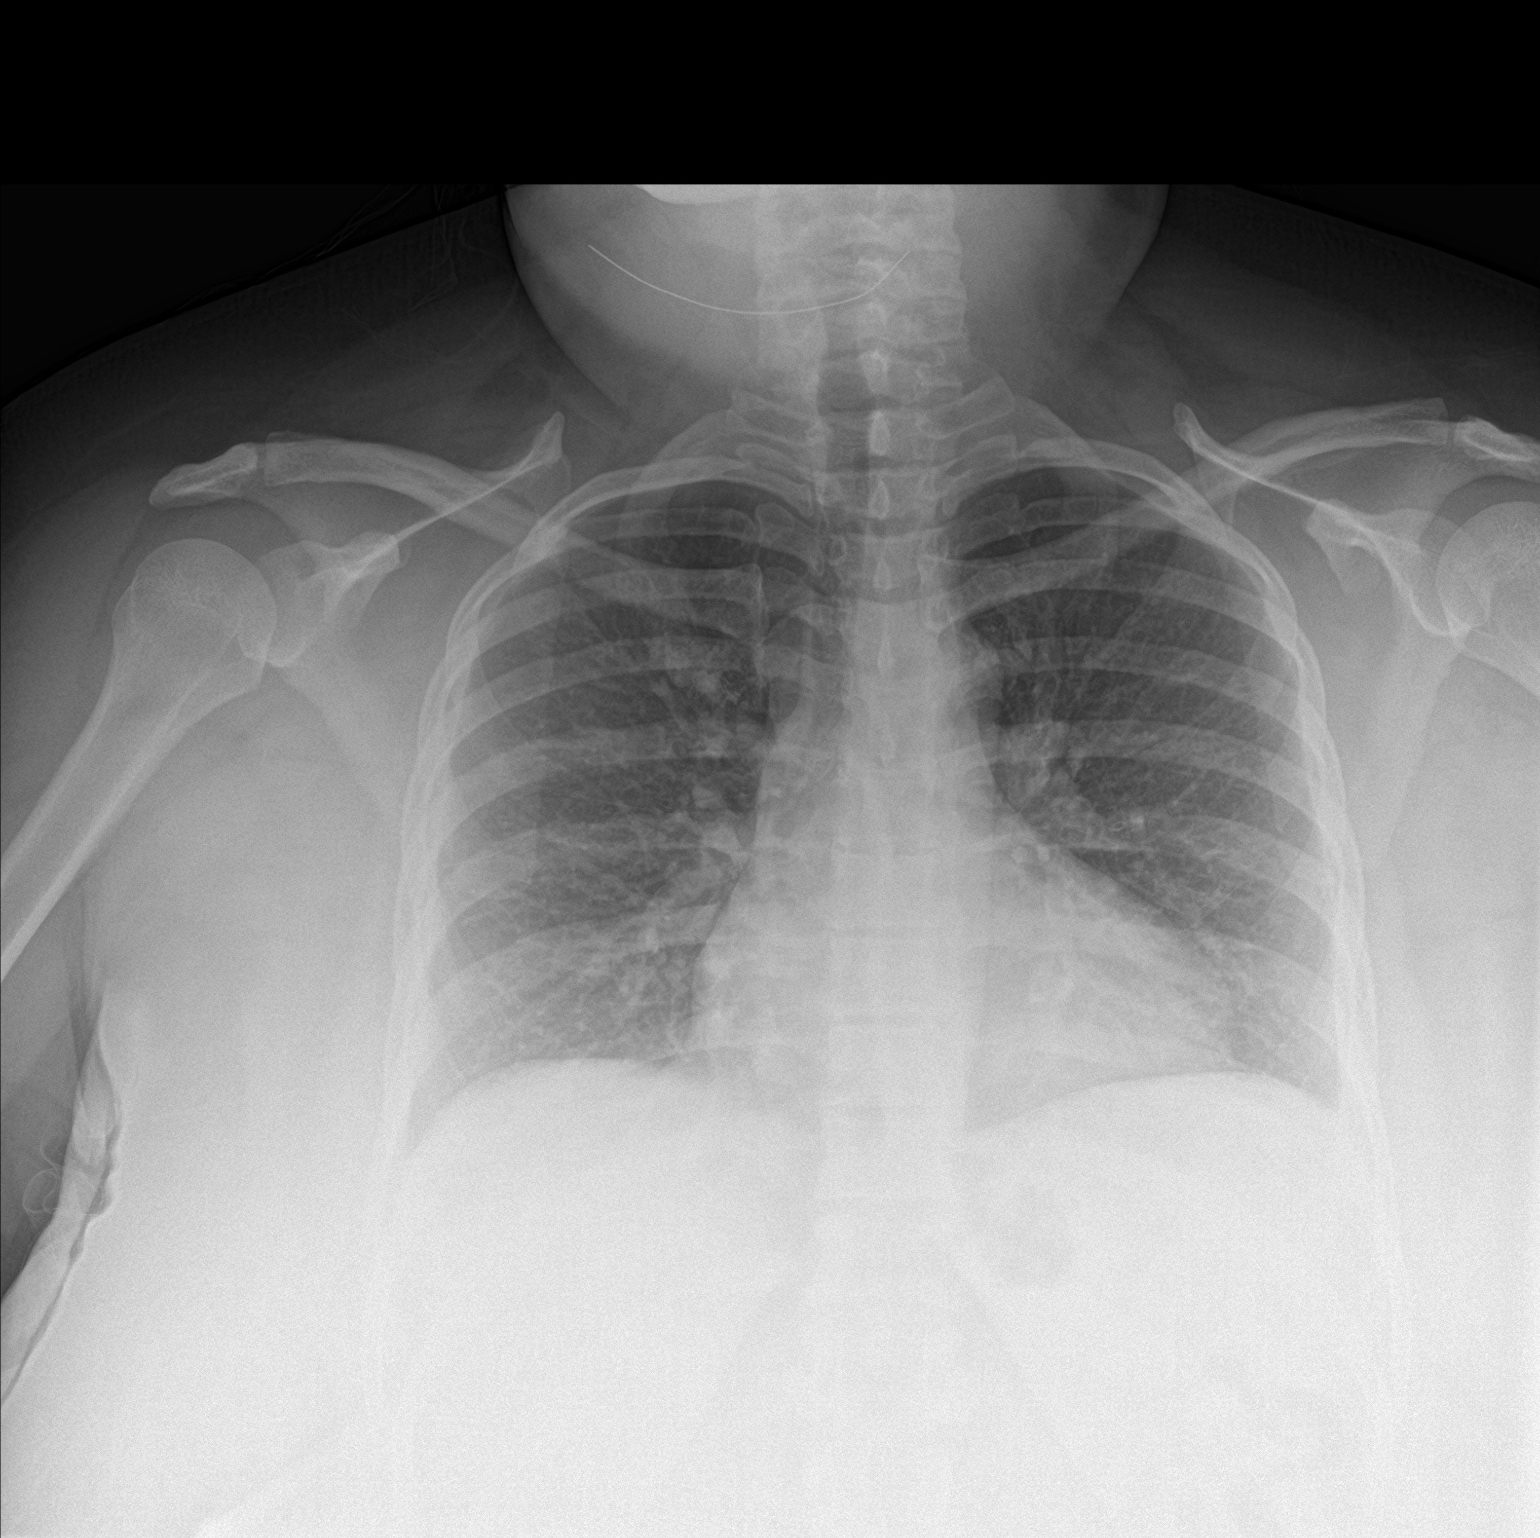

[chest lat]
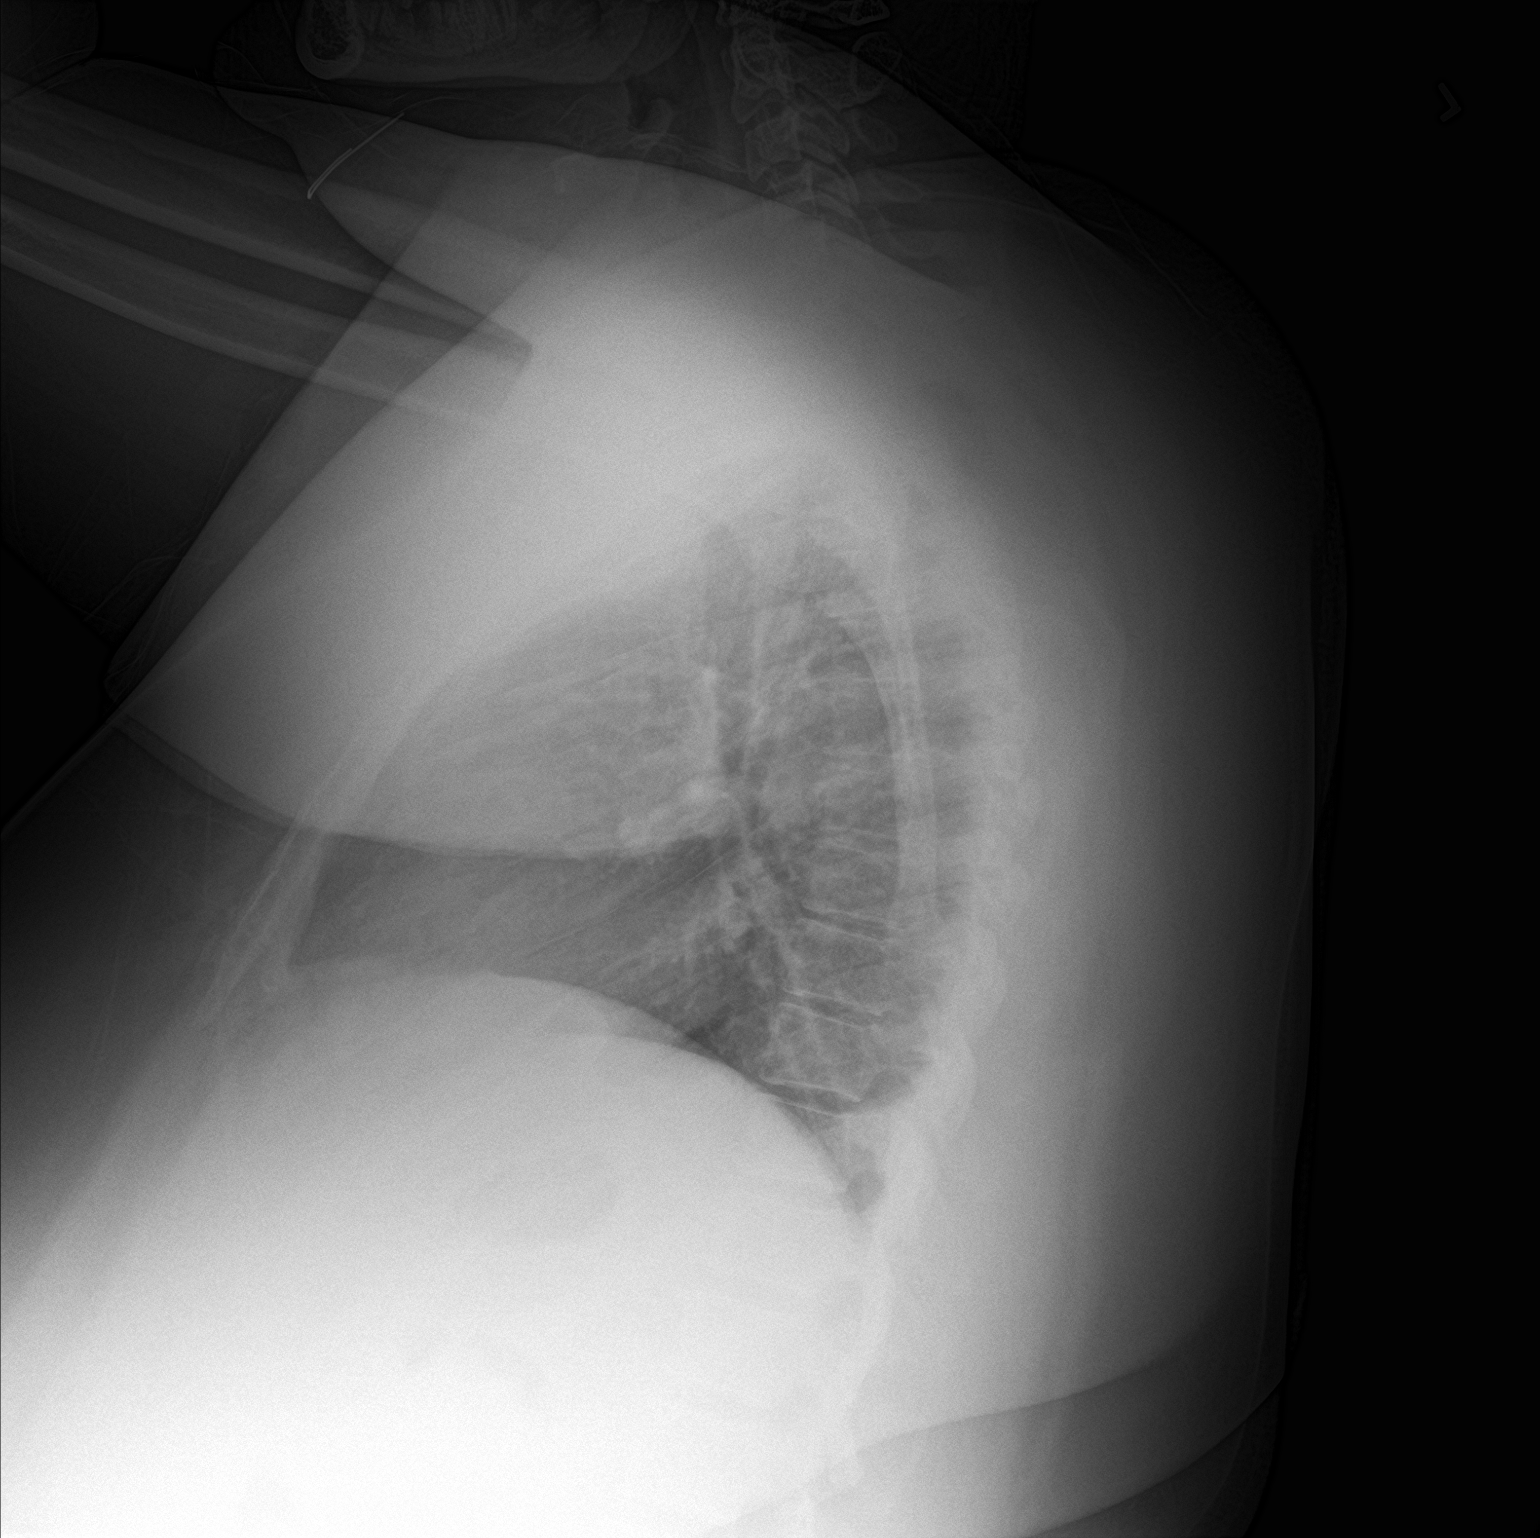

[2 of 2 positions shown; findings below may reference images not displayed]

FINDINGS: The heart size and mediastinal contours are within normal limits.
Both lungs are clear. The visualized skeletal structures are
unremarkable.
IMPRESSION: No active cardiopulmonary disease.

## 2021-12-04 MED ORDER — PENICILLIN G BENZATHINE 1200000 UNIT/2ML IM SUSY
1.2000 10*6.[IU] | PREFILLED_SYRINGE | Freq: Once | INTRAMUSCULAR | Status: AC
Start: 2021-12-04 — End: 2021-12-04
  Administered 2021-12-04: 1.2 10*6.[IU] via INTRAMUSCULAR
  Filled 2021-12-04: qty 2

## 2021-12-04 MED ORDER — LIDOCAINE VISCOUS HCL 2 % MT SOLN
15.0000 mL | Freq: Once | OROMUCOSAL | Status: AC
Start: 2021-12-04 — End: 2021-12-04
  Administered 2021-12-04: 15 mL via OROMUCOSAL
  Filled 2021-12-04: qty 15

## 2021-12-04 MED ORDER — KETOROLAC TROMETHAMINE 30 MG/ML IJ SOLN
30.0000 mg | Freq: Once | INTRAMUSCULAR | Status: AC
  Administered 2021-12-04: 30 mg via INTRAMUSCULAR
  Filled 2021-12-04: qty 1

## 2021-12-04 MED ORDER — DEXAMETHASONE SODIUM PHOSPHATE 10 MG/ML IJ SOLN
10.0000 mg | Freq: Once | INTRAMUSCULAR | Status: AC
  Administered 2021-12-04: 10 mg via INTRAMUSCULAR
  Filled 2021-12-04: qty 1

## 2021-12-04 NOTE — ED Triage Notes (Signed)
Pt endorses sore throat, cough and painful swallowing for a few days. Decreased appetite.

## 2021-12-04 NOTE — BH Assessment (Signed)
Requested cart. 

## 2021-12-04 NOTE — ED Provider Notes (Signed)
Care assumed from previous provider PA Ford. Please see note for further details.  In short patient is a 31 year old female who presented originally for sore throat.  She was found to have strep throat and treated with IM penicillin however upon discharge she stated that she was feeling overwhelmed and suicidal.  Medical clearance was ordered and pending at shift change.  I will follow medical clearance labs and disposition per TTS recommendation.  Physical Exam  BP (!) 161/100 (BP Location: Right Wrist)    Pulse (!) 105    Temp 98.7 F (37.1 C) (Oral)    Resp 17    SpO2 100%   Physical Exam Vitals and nursing note reviewed.  Constitutional:      Appearance: Normal appearance.  HENT:     Head: Normocephalic and atraumatic.  Eyes:     General: No scleral icterus.    Conjunctiva/sclera: Conjunctivae normal.  Pulmonary:     Effort: Pulmonary effort is normal. No respiratory distress.  Skin:    Findings: No rash.  Neurological:     Mental Status: She is alert.  Psychiatric:        Mood and Affect: Mood normal.    Procedures  Procedures  ED Course / MDM    Medical Decision Making  I personally spoke with counselor Reather Laurence who recommends overnight observation at [facility]. Katherine Mantle, NP says because the patient has strep throat she is not able to be at the [facility] facility.  Patient to be observed overnight in the ED.  She is agreeable to this plan stating "I really just need some help.  This is gotten very bad."  She will remain voluntary at this time.    Saddie Benders, PA-C 12/04/21 1757    Charlynne Pander, MD 12/04/21 [phone]

## 2021-12-04 NOTE — ED Notes (Signed)
Patient's belongings placed in mini locker #5.
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
## 2021-12-04 NOTE — ED Provider Notes (Signed)
[facility]   CSN: [id number] Arrival date & time: 12/04/21  1113     History  Chief Complaint  Patient presents with   Sore Throat    [name] is a 31 y.o. female.  [name] is a 31 y.o. female with a history of obesity, depression, hypertension, who presents to the emergency department for evaluation of 4 days of sore throat with painful swallowing and cough.  Patient reports that symptoms have been constant and worsening.  She reports she was having a lot of pain even when trying to swallow water today.  She is also having a cough and reports some associated pain with coughing but no chest pain outside of this.  She has had some chills but no documented fevers.  Reports fatigue and decreased appetite.  She had similar symptoms in November and was diagnosed with strep throat and pneumonia, was having difficulty swallowing and had to be admitted for 1 to 2 days for observation.  The history is provided by the patient and medical records.  Sore Throat Associated symptoms include chest pain (With cough). Pertinent negatives include no shortness of breath.      Home Medications Prior to Admission medications   Medication Sig Start Date End Date Taking? Authorizing Provider  albuterol (VENTOLIN HFA) 108 (90 Base) MCG/ACT inhaler Inhale 2 puffs into the lungs every 6 (six) hours as needed for wheezing or shortness of breath. Please instruct in technique and dispense with spacer 10/11/21   Standley BrookingGoodrich, Daniel P, MD  benzonatate (TESSALON) 100 MG capsule Take 1 capsule (100 mg total) by mouth 2 (two) times daily as needed for cough. 11/27/21   Wurst, Grenada[place]ittany, PA-C  divalproex (DEPAKOTE) 500 MG DR tablet Take 1 tablet (500 mg total) by mouth at bedtime as needed (sleep). 10/11/21   Standley BrookingGoodrich, Daniel P, MD  hydrochlorothiazide (HYDRODIURIL) 12.5 MG tablet Take 12.5 mg by mouth daily. 07/07/21   [provider]   melatonin 3 MG TABS tablet Take 1 tablet (3 mg total) by mouth at bedtime. Patient taking differently: Take 3 mg by mouth at bedtime as needed (sleep). 11/08/20   Thermon Leylandavis, Laura A, NP  ondansetron (ZOFRAN) 4 MG tablet Take 1 tablet (4 mg total) by mouth every 8 (eight) hours as needed for nausea or vomiting. 10/11/21   Standley BrookingGoodrich, Daniel P, MD  predniSONE (STERAPRED UNI-PAK 21 TAB) 10 MG (21) TBPK tablet Take by mouth daily. Take 6 tabs by mouth daily  for 2 days, then 5 tabs for 2 days, then 4 tabs for 2 days, then 3 tabs for 2 days, 2 tabs for 2 days, then 1 tab by mouth daily for 2 days 11/27/21   Alvino ChapelWurst, [place]ittany, PA-C  sertraline (ZOLOFT) 100 MG tablet Take 1 tablet (100 mg total) by mouth daily. 11/09/20   Thermon Leylandavis, Laura A, NP      Allergies    Infuvite adult [multiple vitamin] and Nubain [nalbuphine hcl]    Review of Systems   Review of Systems  Constitutional:  Positive for appetite change, chills and fatigue. Negative for fever.  HENT:  Positive for sore throat and trouble swallowing. Negative for congestion and rhinorrhea.   Respiratory:  Positive for cough. Negative for shortness of breath.   Cardiovascular:  Positive for chest pain (With cough).  Gastrointestinal:  Negative for abdominal distention, nausea and vomiting.  Genitourinary:  Negative for dysuria.  Musculoskeletal:  Negative for arthralgias and myalgias.  All other systems  reviewed and are negative.  Physical Exam Updated Vital Signs BP (!) 161/100 (BP Location: Right Wrist)    Pulse (!) 105    Temp 98.7 F (37.1 C) (Oral)    Resp 17    SpO2 100%  Physical Exam Vitals and nursing note reviewed.  Constitutional:      General: She is not in acute distress.    Appearance: Normal appearance. She is well-developed. She is obese. She is not ill-appearing or diaphoretic.  HENT:     Head: Normocephalic and atraumatic.     Mouth/Throat:     Tonsils: Tonsillar exudate present. No tonsillar abscesses. 2+ on the right. 2+ on the  left.     Comments: Posterior oropharynx is erythematous with bilateral 2+ tonsillar edema and a few small white exudates present, uvula is midline, patient tolerating secretions, normal phonation, no trismus Eyes:     General:        Right eye: No discharge.        Left eye: No discharge.  Cardiovascular:     Rate and Rhythm: Normal rate and regular rhythm.     Heart sounds: Normal heart sounds.  Pulmonary:     Effort: Pulmonary effort is normal. No respiratory distress.     Breath sounds: Normal breath sounds.  Abdominal:     Palpations: Abdomen is soft. There is no mass.     Tenderness: There is no abdominal tenderness.  Musculoskeletal:     Cervical back: Normal range of motion and neck supple.  Lymphadenopathy:     Cervical: Cervical adenopathy present.  Skin:    General: Skin is warm and dry.  Neurological:     Mental Status: She is alert and oriented to person, place, and time.     Coordination: Coordination normal.     Comments: Speech is clear, able to follow commands Moves extremities without ataxia, coordination intact  Psychiatric:        Mood and Affect: Mood normal.        Behavior: Behavior normal.    ED Results / Procedures / Treatments   Labs (all labs ordered are listed, but only abnormal results are displayed) Labs Reviewed  GROUP A STREP BY PCR - Abnormal; Notable for the following components:      Result Value   Group A Strep by PCR DETECTED (*)    All other components within normal limits  RESP PANEL BY RT-PCR (FLU A&B, COVID) [id number]    EKG None  Radiology No results found.  Procedures Procedures    Medications Ordered in ED Medications  ketorolac (TORADOL) 30 MG/ML injection 30 mg (30 mg Intramuscular Given 12/04/21 1423)  dexamethasone (DECADRON) injection 10 mg (10 mg Intramuscular Given 12/04/21 1424)  lidocaine (XYLOCAINE) 2 % viscous mouth solution 15 mL (15 mLs Mouth/Throat Given 12/04/21 1424)  penicillin g benzathine (BICILLIN LA)  1200000 UNIT/2ML injection 1.2 Million Units (1.2 Million Units Intramuscular Given 12/04/21 1422)    ED Course/ Medical Decision Making/ A&P                           Medical Decision Making  31 year old female presents to the ED for 4 days of sore throat, difficulty swallowing and cough.  On arrival she is well-appearing, afebrile, mildly tachycardic and hypertensive.  She does not appear to be in any respiratory distress, no stridor, and tolerating her secretions.  On exam she has moderate bilateral tonsillar edema but uvula is midline,  no trismus, normal phonation.  Exam is not concerning for peritonsillar abscess.  Patient also reports a cough, and that she has some pain in the chest with coughing.  Lungs are clear on auscultation but patient is worried because she has had pneumonia previously.  Strep, COVID and flu testing ordered from triage.  We will also send patient for chest x-ray to assess for pneumonia.  Given that patient is tolerating secretions, does not have concern for peritonsillar abscess on exam and vitals are overall reassuring do not feel that she needs lab work or further emergent work-up here.  Additional history obtained from medical records. Previous records obtained and reviewed via EMR  Patient given IM Toradol and Decadron, as well as viscous lidocaine and IM penicillin for treatment of strep pharyngitis.'  Lab Tests:  I Ordered, reviewed, and interpreted labs, which included:  Group A strep PCR: Positive COVID/flu: Negative  Imaging Studies ordered:  I ordered imaging studies which included chest x-ray, I independently visualized and interpreted imaging which showed without evidence of pneumonia or other active cardiopulmonary disease.  ED Course:   After my initial evaluation while patient was receiving medications she reported to nurse that she has been feeling depressed and has not been taking her medication and feels like she needs to talk to someone.  I spoke  further with patient regarding this she reports that about a year ago she stopped taking her psychiatric medications because she felt like she was doing well.  But recently has had a lot of family issues and has been under stress and over the past 2 weeks reports she has been feeling increasingly angry.  Having thoughts about taking pills.  Patient does not feel safe and is concerned that if she leaves here today she will hurt herself.  She reports she stopped herself from taking pills last night.  Patient denies any HI or AVH.  Patient reports that she does not feel safe and is concerned she will do something stupid that hurts herself, and she also reports she is worried about being around her children feeling like this.  Spoke with psych APP Liborio Nixon who reviewed patient's chart and complaints, given her reports of suicidal ideation with plan she feels she would need a higher level of care than be [facility] and may meet inpatient criteria so recommends keeping her in the emergency department and placing TTS consult.  Will also get medical clearance labs.  Patient will remain here in the emergency department voluntarily for psych evaluation.  At shift change care signed out to [facility] who will follow up on medical clearance labs and psych recommendations.   Portions of this note were generated with Scientist, clinical (histocompatibility and immunogenetics). Dictation errors may occur despite best attempts at proofreading.         Final Clinical Impression(s) / ED Diagnoses Final diagnoses:  Strep pharyngitis  Suicidal ideation    Rx / DC Orders ED Discharge Orders     None         Dartha Lodge, [place] 12/04/21 1510    Maia Plan, MD 12/04/21 [phone]

## 2021-12-05 ENCOUNTER — Encounter (HOSPITAL_COMMUNITY): Payer: Self-pay

## 2021-12-05 DIAGNOSIS — F3175 Bipolar disorder, in partial remission, most recent episode depressed: Secondary | ICD-10-CM

## 2021-12-05 LAB — RAPID URINE DRUG SCREEN, HOSP PERFORMED
Amphetamines: NOT DETECTED
Barbiturates: NOT DETECTED
Benzodiazepines: NOT DETECTED
Cocaine: NOT DETECTED
Opiates: NOT DETECTED
Tetrahydrocannabinol: NOT DETECTED

## 2021-12-05 MED ORDER — DIVALPROEX SODIUM 500 MG PO DR TAB: 500.0000 mg | DELAYED_RELEASE_TABLET | Freq: Every evening | ORAL | Status: DC | PRN

## 2021-12-05 MED ORDER — SERTRALINE HCL 50 MG PO TABS
25.0000 mg | ORAL_TABLET | Freq: Every day | ORAL | Status: DC
  Administered 2021-12-05 – 2021-12-06 (×2): 25 mg via ORAL
  Filled 2021-12-05 (×2): qty 1

## 2021-12-05 NOTE — Consult Note (Signed)
°  Alexandra Gray, 31 y.o., female patient seen face to face by this provider, consulted with Dr. Hampton Abbot; and chart reviewed on 12/05/21.  On evaluation Alexandra Gray reports she initially came to the emergency room related to having a sore throat.  Reports she has a long history of depression and she was having suicidal thoughts.  Plan to overdose.  Patient stating "I just want to die."  Patient is unable to contract for safety.  Patient also reports a prior history of a suicide attempt about a month ago where she took an overdose of pills that belonged to someone else but did not have to go to the hospital.  Patient denies auditory/visual hallucinations, psychosis, and homicidal ideation.  Patient reporting she was treated in the past for bipolar depression and was prescribed Depakote and Zoloft.  Reports it has been over a year since she is taking any psychotropic medications but when she was taking them they did not work.  Discussed restarting her medication and recommending inpatient psychiatric treatment for her. During evaluation Alexandra Gray is sitting up in bed in no acute distress.  She is alert, oriented x 4, calm and cooperative.  Her mood is depressed with congruent affect.  She does not appear to be responding to internal/external stimuli or delusional thoughts.  Patient denies suicidal/self-harm/homicidal ideation, psychosis, and paranoia.  Patient answered question appropriately.    Disposition: Recommend psychiatric Inpatient admission when medically cleared.    [name] B. Darnesha Diloreto, NP

## 2021-12-05 NOTE — ED Notes (Signed)
Lunch tray delivered.

## 2021-12-05 NOTE — ED Provider Notes (Signed)
31 year old female seen initially for sore throat, strep positive, treated with Bicillin.  Reported SI at time of discharge and has been held for behavioral health.  Review of notes, was recommended to be transferred to [facility] however would not accept in transfer due to her strep positive status.  Patient is nearing 24 hours post dose of IM Bicillin.  Reached out to [employer], APP at [facility] for update- can pt now be transferred?  No response received.  Patient completing repeat evaluation with BH.   Jeannie Fend, PA-C 12/05/21 1352    Wynetta Fines, MD 12/06/21 [phone]

## 2021-12-06 ENCOUNTER — Other Ambulatory Visit: Payer: Self-pay | Admitting: Registered Nurse

## 2021-12-06 ENCOUNTER — Encounter (HOSPITAL_COMMUNITY): Payer: Self-pay | Admitting: Registered Nurse

## 2021-12-06 ENCOUNTER — Other Ambulatory Visit: Payer: Self-pay

## 2021-12-06 ENCOUNTER — Inpatient Hospital Stay (HOSPITAL_COMMUNITY)
Admission: AD | Admit: 2021-12-06 | Discharge: 2021-12-16 | DRG: 882 | Disposition: A | Discharge status: Home / Self Care | Payer: Medicaid Other | Source: Intra-hospital | Attending: Psychiatry | Admitting: Psychiatry

## 2021-12-06 DIAGNOSIS — Z9151 Personal history of suicidal behavior: Secondary | ICD-10-CM | POA: Diagnosis not present

## 2021-12-06 DIAGNOSIS — I10 Essential (primary) hypertension: Secondary | ICD-10-CM | POA: Diagnosis present

## 2021-12-06 DIAGNOSIS — J020 Streptococcal pharyngitis: Secondary | ICD-10-CM | POA: Diagnosis present

## 2021-12-06 DIAGNOSIS — E781 Pure hyperglyceridemia: Secondary | ICD-10-CM | POA: Diagnosis present

## 2021-12-06 DIAGNOSIS — F6089 Other specific personality disorders: Secondary | ICD-10-CM | POA: Diagnosis present

## 2021-12-06 DIAGNOSIS — K5900 Constipation, unspecified: Secondary | ICD-10-CM | POA: Diagnosis present

## 2021-12-06 DIAGNOSIS — G4700 Insomnia, unspecified: Secondary | ICD-10-CM | POA: Diagnosis present

## 2021-12-06 DIAGNOSIS — F332 Major depressive disorder, recurrent severe without psychotic features: Secondary | ICD-10-CM | POA: Diagnosis present

## 2021-12-06 DIAGNOSIS — Z23 Encounter for immunization: Secondary | ICD-10-CM

## 2021-12-06 DIAGNOSIS — R45851 Suicidal ideations: Secondary | ICD-10-CM | POA: Diagnosis present

## 2021-12-06 DIAGNOSIS — G4730 Sleep apnea, unspecified: Secondary | ICD-10-CM | POA: Diagnosis present

## 2021-12-06 DIAGNOSIS — F4310 Post-traumatic stress disorder, unspecified: Secondary | ICD-10-CM | POA: Diagnosis present

## 2021-12-06 DIAGNOSIS — G4733 Obstructive sleep apnea (adult) (pediatric): Secondary | ICD-10-CM | POA: Diagnosis present

## 2021-12-06 MED ORDER — ALUM & MAG HYDROXIDE-SIMETH 200-200-20 MG/5ML PO SUSP: 30.0000 mL | ORAL | Status: DC | PRN

## 2021-12-06 MED ORDER — SERTRALINE HCL 25 MG PO TABS
25.0000 mg | ORAL_TABLET | Freq: Every day | ORAL | Status: DC
Start: 2021-12-06 — End: 2021-12-08
  Administered 2021-12-07 – 2021-12-08 (×2): 25 mg via ORAL
  Filled 2021-12-06 (×5): qty 1

## 2021-12-06 MED ORDER — ACETAMINOPHEN 325 MG PO TABS
650.0000 mg | ORAL_TABLET | Freq: Four times a day (QID) | ORAL | Status: DC | PRN
  Administered 2021-12-06 – 2021-12-13 (×9): 650 mg via ORAL
  Filled 2021-12-06 (×8): qty 2

## 2021-12-06 MED ORDER — DIVALPROEX SODIUM 500 MG PO DR TAB
500.0000 mg | DELAYED_RELEASE_TABLET | Freq: Every day | ORAL | Status: DC
  Administered 2021-12-06 – 2021-12-07 (×2): 500 mg via ORAL
  Filled 2021-12-06 (×5): qty 1

## 2021-12-06 MED ORDER — MAGNESIUM HYDROXIDE 400 MG/5ML PO SUSP: 30.0000 mL | Freq: Every day | ORAL | Status: DC | PRN

## 2021-12-06 NOTE — ED Notes (Signed)
Patient discharged to [facility] via [facility]. Belongings given to the driver.

## 2021-12-06 NOTE — Tx Team (Signed)
Initial Treatment Plan 12/06/2021 6:53 PM Alexandra Gray [id number]    PATIENT STRESSORS: Loss of family member's friends   Medication change or noncompliance   Substance abuse     PATIENT STRENGTHS: Ability for insight  Average or above average intelligence  Capable of independent living  Communication skills  General fund of knowledge  Motivation for treatment/growth  Supportive family/friends    PATIENT IDENTIFIED PROBLEMS: Suicidal ideation  depression  anxiety                 DISCHARGE CRITERIA:  Improved stabilization in mood, thinking, and/or behavior Motivation to continue treatment in a less acute level of care Need for constant or close observation no longer present Reduction of life-threatening or endangering symptoms to within safe limits Verbal commitment to aftercare and medication compliance  PRELIMINARY DISCHARGE PLAN: Outpatient therapy Return to previous living arrangement  PATIENT/FAMILY INVOLVEMENT: This treatment plan has been presented to and reviewed with the patient, Alexandra Gray, and/or family member.  The patient and family have been given the opportunity to ask questions and make suggestions.  Judie Petit, RN 12/06/2021, 6:53 PM

## 2021-12-06 NOTE — Progress Notes (Signed)
BHH/BMU LCSW Progress Note   12/06/2021    1:04 PM  Alexandra Gray   [id number]   Type of Contact and Topic:  Psychiatric Bed Placement   Pt accepted to [facility]   Patient meets inpatient criteria per Assunta Found, NP   The attending provider will be Phineas Inches, RN   Call report to [phone]    Ronald Pippins, RN @ [facility] ED notified.     Pt scheduled  to arrive at [facility] TODAY between 1600-1630. Please fax voluntary consent prior to transporting the Pt.   Damita Dunnings, MSW, LCSW-A  1:05 PM 12/06/2021

## 2021-12-06 NOTE — BHH Group Notes (Signed)
Adult Psychoeducational Group Note  Date:  12/06/2021 Time:  11:01 PM  Group Topic/Focus:  Coping With Mental Health Crisis:   The purpose of this group is to help patients identify strategies for coping with mental health crisis.  Group discusses possible causes of crisis and ways to manage them effectively.  Participation Level:  Minimal  Participation Quality:  Appropriate  Affect:  Appropriate  Cognitive:  Lacking  Insight: Lacking  Engagement in Group:  Limited  Modes of Intervention:  Discussion  Additional Comments:  Jacalyn Lefevre 12/06/2021, 11:01 PM

## 2021-12-06 NOTE — ED Provider Notes (Signed)
Emergency Medicine Observation Re-evaluation Note  Alexandra Gray is a 31 y.o. female, seen on rounds today at 0700.  Pt initially presented to the ED for complaints of Sore Throat and Suicidal (Pt reported that she is overwhelmed with depression and is experiencing suicidal ideation.) Currently, the patient is resting comfortably.  Physical Exam  BP 139/85    Pulse 82    Temp 98.7 F (37.1 C) (Oral)    Resp 17    SpO2 100%  Physical Exam General: asleep, nad  ED Course / MDM  EKG:   I have reviewed the labs performed to date as well as medications administered while in observation.  Recent changes in the last 24 hours include no acute events reported.  Plan  Current plan is for psychiatric placement (Accepted at [facility], transfer on 1/11).  SAMARRAH VANHYNING is not under involuntary commitment.     Valarie Merino, MD 12/06/21 [phone]

## 2021-12-06 NOTE — Progress Notes (Signed)
Inpatient Behavioral Health Placement  Pt meets inpatient criteria per Assunta Found, NP. There are no appropriate beds at [facility] per [facility] Fransico Michael, RN  Referral was sent to the following facilities;    Destination Service Provider Address Phone Fax  [facility]  [address] [place] [address] [phone]  [facility]  [address] [place] [address] [phone]  [facility] [address]  [address] [place] [address] [phone]  [facility]  [address] [place], [place] [address] [phone]  [facility] [place] [address] [phone]  [facility]  [address] [place] [address] [phone]  [facility]  [address] [place] [address] [phone]  [facility]  [address] [place] [address] [phone]  [facility]  [address] [place] [address] [phone]  [facility]  [address] [place] [address] [phone]  [facility]  [address] [place] [address] [phone]  [facility]  [address] [place] [address] [phone]  [facility]  [address] [place] [address] [phone]  [facility]  [address] [place] [address] [phone]  [facility]  [address] [place] [address] [phone]  [facility]  [address] [place] [address] [phone]  [facility]  [address] [place], [place]  [address] [phone]  [facility]  [address], [place] [address] [phone]   Situation ongoing,  CSW will follow up.   Maryjean Ka, MSW, LCSWA 12/06/2021  @ 12:26 AM

## 2021-12-06 NOTE — Progress Notes (Addendum)
Pt admitted voluntarily to [facility]  I've been going through it for the last few weeks.  I became isolative.  I felt the world was crushing down on my shoulders.  Because of her past history (suicide attempt) she decided to seek help.  Pt was feeling angry.   Began to increase drinking and marijuana use because of increased depression.  Pt stated she was feeling guilty because of not being able to do more for her children.  Pt has also lost several people close to her during the past year.  "l can't eat, mind racing all the time"  Pt non-compliant with medication. Pt denied SI, HI and AVH.  Fifteen minute checks initiated.  Pt safe on unit.

## 2021-12-06 NOTE — Progress Notes (Signed)
Patient has been accepted to [facility]   Patient meets [facility] inpatient criteria per Assunta Found, NP  The accepting MD is [facility] D. Lambert Mody, RN @ [facility] ED has been notified   Patient can arrive tomorrow after 0900 at [facility]   Damita Dunnings, MSW, LCSW-A  8:58 AM 12/06/2021

## 2021-12-07 ENCOUNTER — Encounter (HOSPITAL_COMMUNITY): Payer: Self-pay

## 2021-12-07 ENCOUNTER — Encounter (HOSPITAL_COMMUNITY): Payer: Self-pay | Admitting: Registered Nurse

## 2021-12-07 DIAGNOSIS — F332 Major depressive disorder, recurrent severe without psychotic features: Secondary | ICD-10-CM

## 2021-12-07 NOTE — Group Note (Signed)
Recreation Therapy Group Note   Group Topic:Stress Management  Group Date: 12/07/2021 Start Time: 0932 End Time: 0945 Facilitators: Victorino Sparrow, [place] Location: [address] Dayroom   Goal Area(s) Addresses:  Patient will identify positive stress management techniques. Patient will identify benefits of using stress management post d/c.  Group Description: Meditation.  LRT played a meditation on having gratitude.  The meditation spoke of being grateful not for physical things but for experiences that have been overcome, people and the simple things such as life and having a place to live.  Patients were to listen and follow as meditation played to fully engage.   Affect/Mood: Appropriate   Participation Level: Active   Participation Quality: Independent   Behavior: Appropriate   Speech/Thought Process: Focused   Insight: Good   Judgement: Good   Modes of Intervention: Meditation   Patient Response to Interventions:  Engaged   Education Outcome:  Acknowledges education and In group clarification offered    Clinical Observations/Individualized Feedback: Pt attended and participated in group.    Plan: Continue to engage patient in RT group sessions 2-3x/week.   Victorino Sparrow, Glennis Brink 12/07/2021 12:38 PM

## 2021-12-07 NOTE — BHH Counselor (Signed)
CSW placed miscarriage support resources on pt's chart to be given at discharge.   Fredirick Lathe, LCSWA Clinicial Social Worker [employer]
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
## 2021-12-07 NOTE — Progress Notes (Signed)
°   12/06/21 2035  Psych Admission Type (Psych Patients Only)  Admission Status Voluntary  Psychosocial Assessment  Patient Complaints Depression  Eye Contact Brief  Facial Expression Anxious  Affect Depressed  Speech Logical/coherent  Interaction Minimal  Motor Activity Other (Comment) (wnl)  Appearance/Hygiene Unremarkable  Behavior Characteristics Cooperative  Mood Depressed  Thought Process  Coherency WDL  Content WDL  Delusions WDL  Perception WDL  Hallucination None reported or observed  Judgment WDL  Confusion None  Danger to Self  Current suicidal ideation? Denies  Danger to Others  Danger to Others None reported or observed   Pt seen in her room. Pt denies SI, HI, AVH. Pt endorses pain 7/10 as a headache focused in the forehead area. Pt denies anxiety and rates depression 8/10. "I've been going through for the last 2 weeks. I thought I could handle it (my depression) myself but it just got worse." Pt said she has 2 children and wants to do well for them. They are with her mother right now. Pt was previously taking medication but stopped because she felt she could handle it herself. Pt has taken Depakote before with good effect. Pt does not want Mirtazapine because it makes her too lethargic in the morning. Pt also does not want Trazodone either because she has used it to OD before. Pt is willing to go back on her medications.

## 2021-12-07 NOTE — Group Note (Signed)
LCSW Group Therapy Note  Group Date: 12/07/2021 Start Time: 1300 End Time: 1400   Type of Therapy and Topic:  Group Therapy: Self-Harm Alternatives  Participation Level:  Active   Description of Group:   Patients participated in a discussion regarding non-suicidal self-injurious behavior (NSSIB, or self-harm) and the stigma surrounding it. There was also discussion surrounding how other maladaptive coping skills could be seen as self-harm, such as substance abuse. Participants were invited to share their experiences with self-harm, with emphasis being placed on the motivation for self-harm (such as release, punishment, feeling numb, etc). Patients were then asked to brainstorm potential substitutions for self-harm and were provided with a handout entitled, "Distraction Techniques and Alternative Coping Strategies," published by [facility] [place] [facility] Self-Injury [facility].  Therapeutic Goals:  Patients will be given the opportunity to discuss NSSIB in a non-judgmental and therapeutic environment. Patients will identify which feelings lead to NSSIB.  Patients will discuss potential healthy coping skills to replace NSSIB Open discussion will specifically address stigma and shame surrounding NSSIB.   Summary of Patient Progress:  Pt was present/active throughout the session and proved open to feedback from [place] and peers. Patient demonstrated insight into the subject matter, was respectful of peers, and was present throughout the entire session. Pt shared during introductions that something that makes her smile is thinking about her kids.   Therapeutic Modalities:   Cognitive Behavioral Therapy   Eliott Nine 12/07/2021  1:38 PM
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
## 2021-12-07 NOTE — Progress Notes (Addendum)
°  D. Pt presents with a sad affect/ depressed mood- calm cooperative behavior- rated her depression, hopelessness and anxiety an 8/3/6, respectively. Pt observed attending group this am, and appeared engaged. Pt currently denies SI/HI and AVH  A. Labs and vitals monitored. Pt given and educated on medications. Pt supported emotionally and encouraged to express concerns and ask questions.   R. Pt remains safe with 15 minute checks. Will continue POC.   12/07/21 1200  Psych Admission Type (Psych Patients Only)  Admission Status Voluntary  Psychosocial Assessment  Patient Complaints Depression  Eye Contact Brief  Facial Expression Sad  Affect Depressed  Speech Logical/coherent  Interaction Minimal  Motor Activity Other (Comment) (wnl)  Appearance/Hygiene Unremarkable  Behavior Characteristics Cooperative  Mood Depressed  Thought Process  Coherency WDL  Content WDL  Delusions WDL  Perception WDL  Hallucination None reported or observed  Judgment WDL  Confusion None  Danger to Self  Current suicidal ideation? Denies  Danger to Others  Danger to Others None reported or observed

## 2021-12-07 NOTE — Group Note (Signed)
Date:  12/07/2021 Time:  9:27 AM  Group Topic/Focus:  Orientation:   The focus of this group is to educate the patient on the purpose and policies of crisis stabilization and provide a format to answer questions about their admission.  The group details unit policies and expectations of patients while admitted.    Participation Level:  Active  Participation Quality:  Appropriate  Affect:  Appropriate  Cognitive:  Appropriate  Insight: Appropriate  Engagement in Group:  Engaged  Modes of Intervention:  Discussion  Additional Comments:  Pt was engaged in group.  Garvin Fila 12/07/2021, 9:27 AM

## 2021-12-07 NOTE — Progress Notes (Signed)
[facility] LCSW Progress Note   12/07/2021    3:43 PM  [name]   [id number]   Type of Contact and Topic:  Release of Information  [name], patient, provided written consent for CSW team to coordinate aftercare at this time. CSW to have further conversations with patient regarding care in the community.   Patient sis seen by April McCory with [facility], Patient is interested in Med Mgnt referral.   Signed:  Corky Crafts, MSW, LCSWA, LCASA 12/07/2021 3:43 PM
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
## 2021-12-07 NOTE — BH IP Treatment Plan (Signed)
Interdisciplinary Treatment and Diagnostic Plan Update  12/07/2021 Time of Session: 9:05am  Alexandra Gray MRN: [id number]  Principal Diagnosis: MDD (major depressive disorder), recurrent severe, without psychosis ([place])  Secondary Diagnoses: Principal Problem:   MDD (major depressive disorder), recurrent severe, without psychosis ([place])   Current Medications:  Current Facility-Administered Medications  Medication Dose Route Frequency Provider Last Rate Last Admin   acetaminophen (TYLENOL) tablet 650 mg  650 mg Oral Q6H PRN Rankin, Shuvon B, NP   650 mg at 12/07/21 0807   alum & mag hydroxide-simeth (MAALOX/MYLANTA) 200-200-20 MG/5ML suspension 30 mL  30 mL Oral Q4H PRN Rankin, Shuvon B, NP       divalproex (DEPAKOTE) DR tablet 500 mg  500 mg Oral QHS Rankin, Shuvon B, NP   500 mg at 12/06/21 2112   magnesium hydroxide (MILK OF MAGNESIA) suspension 30 mL  30 mL Oral Daily PRN Rankin, Shuvon B, NP       sertraline (ZOLOFT) tablet 25 mg  25 mg Oral Daily Rankin, Shuvon B, NP   25 mg at 12/07/21 0807   PTA Medications: Medications Prior to Admission  Medication Sig Dispense Refill Last Dose   acetaminophen (TYLENOL) 500 MG tablet Take 1,000 mg by mouth every 6 (six) hours as needed for moderate pain or headache.      albuterol (VENTOLIN HFA) 108 (90 Base) MCG/ACT inhaler Inhale 2 puffs into the lungs every 6 (six) hours as needed for wheezing or shortness of breath. Please instruct in technique and dispense with spacer 8 g 0    benzonatate (TESSALON) 100 MG capsule Take 1 capsule (100 mg total) by mouth 2 (two) times daily as needed for cough. 20 capsule 0    divalproex (DEPAKOTE) 500 MG DR tablet Take 1 tablet (500 mg total) by mouth at bedtime as needed (sleep). (Patient not taking: Reported on 12/05/2021)      melatonin 3 MG TABS tablet Take 1 tablet (3 mg total) by mouth at bedtime. (Patient not taking: Reported on 12/05/2021) 30 tablet 0    ondansetron (ZOFRAN) 4 MG tablet Take 1 tablet  (4 mg total) by mouth every 8 (eight) hours as needed for nausea or vomiting. 20 tablet 0    predniSONE (STERAPRED UNI-PAK 21 TAB) 10 MG (21) TBPK tablet Take by mouth daily. Take 6 tabs by mouth daily  for 2 days, then 5 tabs for 2 days, then 4 tabs for 2 days, then 3 tabs for 2 days, 2 tabs for 2 days, then 1 tab by mouth daily for 2 days (Patient not taking: Reported on 12/05/2021) 42 tablet 0    sertraline (ZOLOFT) 100 MG tablet Take 1 tablet (100 mg total) by mouth daily. (Patient not taking: Reported on 12/05/2021) 30 tablet 0     Patient Stressors: Loss of family member's friends   Medication change or noncompliance   Substance abuse    Patient Strengths: Ability for insight  Average or above average intelligence  Capable of independent living  Communication skills  General fund of knowledge  Motivation for treatment/growth  Supportive family/friends   Treatment Modalities: Medication Management, Group therapy, Case management,  1 to 1 session with clinician, Psychoeducation, Recreational therapy.   Physician Treatment Plan for Primary Diagnosis: MDD (major depressive disorder), recurrent severe, without psychosis ([place]) Long Term Goal(s): Improvement in symptoms so as ready for discharge   Short Term Goals: Ability to identify changes in lifestyle to reduce recurrence of condition will improve Ability to verbalize feelings will improve  Ability to disclose and discuss suicidal ideas Ability to demonstrate self-control will improve Ability to identify and develop effective coping behaviors will improve Ability to maintain clinical measurements within normal limits will improve Compliance with prescribed medications will improve Ability to identify triggers associated with substance abuse/mental health issues will improve  Medication Management: Evaluate patient's response, side effects, and tolerance of medication regimen.  Therapeutic Interventions: 1 to 1 sessions, Unit Group  sessions and Medication administration.  Evaluation of Outcomes: Not Met  Physician Treatment Plan for Secondary Diagnosis: Principal Problem:   MDD (major depressive disorder), recurrent severe, without psychosis ([place])  Long Term Goal(s): Improvement in symptoms so as ready for discharge   Short Term Goals: Ability to identify changes in lifestyle to reduce recurrence of condition will improve Ability to verbalize feelings will improve Ability to disclose and discuss suicidal ideas Ability to demonstrate self-control will improve Ability to identify and develop effective coping behaviors will improve Ability to maintain clinical measurements within normal limits will improve Compliance with prescribed medications will improve Ability to identify triggers associated with substance abuse/mental health issues will improve     Medication Management: Evaluate patient's response, side effects, and tolerance of medication regimen.  Therapeutic Interventions: 1 to 1 sessions, Unit Group sessions and Medication administration.  Evaluation of Outcomes: Not Met   RN Treatment Plan for Primary Diagnosis: MDD (major depressive disorder), recurrent severe, without psychosis ([place]) Long Term Goal(s): Knowledge of disease and therapeutic regimen to maintain health will improve  Short Term Goals: Ability to remain free from injury will improve, Ability to participate in decision making will improve, Ability to verbalize feelings will improve, Ability to disclose and discuss suicidal ideas, and Ability to identify and develop effective coping behaviors will improve  Medication Management: RN will administer medications as ordered by provider, will assess and evaluate patient's response and provide education to patient for prescribed medication. RN will report any adverse and/or side effects to prescribing provider.  Therapeutic Interventions: 1 on 1 counseling sessions, Psychoeducation, Medication  administration, Evaluate responses to treatment, Monitor vital signs and CBGs as ordered, Perform/monitor CIWA, COWS, AIMS and Fall Risk screenings as ordered, Perform wound care treatments as ordered.  Evaluation of Outcomes: Not Met   LCSW Treatment Plan for Primary Diagnosis: MDD (major depressive disorder), recurrent severe, without psychosis ([place]) Long Term Goal(s): Safe transition to appropriate next level of care at discharge, Engage patient in therapeutic group addressing interpersonal concerns.  Short Term Goals: Engage patient in aftercare planning with referrals and resources, Increase social support, Increase emotional regulation, Facilitate acceptance of mental health diagnosis and concerns, Identify triggers associated with mental health/substance abuse issues, and Increase skills for wellness and recovery  Therapeutic Interventions: Assess for all discharge needs, 1 to 1 time with Social worker, Explore available resources and support systems, Assess for adequacy in community support network, Educate family and significant other(s) on suicide prevention, Complete Psychosocial Assessment, Interpersonal group therapy.  Evaluation of Outcomes: Not Met   Progress in Treatment: Attending groups: Yes. Participating in groups: Yes. Taking medication as prescribed: Yes. Toleration medication: Yes. Family/Significant other contact made: Yes, individual(s) contacted:  If consents are provided  Patient understands diagnosis: Yes. Discussing patient identified problems/goals with staff: Yes. Medical problems stabilized or resolved: Yes. Denies suicidal/homicidal ideation: Yes. Issues/concerns per patient self-inventory: No.   New problem(s) identified: No, Describe:  None   New Short Term/Long Term Goal(s): medication stabilization, elimination of SI thoughts, development of comprehensive mental wellness plan.   Patient  Goals: "To work on my depression and anger"   Discharge Plan  or Barriers: Patient recently admitted. CSW will continue to follow and assess for appropriate referrals and possible discharge planning.   Reason for Continuation of Hospitalization: Depression Medication stabilization Suicidal ideation  Estimated Length of Stay: 3 to 5 days    Scribe for Treatment Team: Darleen Crocker, Latanya Presser 12/07/2021 2:12 PM

## 2021-12-08 LAB — LIPID PANEL
Cholesterol: 159 mg/dL (ref 0–200)
HDL: 34 mg/dL — ABNORMAL LOW (ref >40)
LDL Cholesterol: 90 mg/dL (ref 0–99)
Total CHOL/HDL Ratio: 4.7 RATIO
Triglycerides: 177 mg/dL — ABNORMAL HIGH (ref <150)
VLDL: 35 mg/dL (ref 0–40)

## 2021-12-08 LAB — HEMOGLOBIN A1C
Hgb A1c MFr Bld: 5.3 % (ref 4.8–5.6)
Mean Plasma Glucose: 105.41 mg/dL

## 2021-12-08 MED ORDER — MELATONIN 3 MG PO TABS
3.0000 mg | ORAL_TABLET | Freq: Every day | ORAL | Status: DC
  Administered 2021-12-08 – 2021-12-10 (×2): 3 mg via ORAL
  Filled 2021-12-08 (×6): qty 1

## 2021-12-08 MED ORDER — DESVENLAFAXINE SUCCINATE ER 50 MG PO TB24
50.0000 mg | ORAL_TABLET | Freq: Every day | ORAL | Status: DC
  Administered 2021-12-09 – 2021-12-16 (×8): 50 mg via ORAL
  Filled 2021-12-08 (×11): qty 1

## 2021-12-08 NOTE — Hospital Course (Addendum)
Alexandra Gray is a 31 y.o. female with PPHx of MDD, PTSD, Alcohol use disorder, and Cluster B who was admitted to [facility] for management and treatment of MDD with SI  Psychiatric diagnoses provided upon initial assessment: Principal Problem:   Severe episode of recurrent major depressive disorder, without psychotic features (HCC) Active Problems:   PTSD (post-traumatic stress disorder)   Cluster B personality disorder (HCC)   Sleep apnea   Benign essential HTN    Patient's psychiatric medications were adjusted on admission:  No home meds Restarted Depakote & Zoloft because it was the regimen that maintained her stability before she stopped them ~1 year prior to admission  During the hospitalization, other adjustments were made to the patient's psychiatric medication regimen:  Discontinued Depakote because ineffective for mood lability Discontinued Zoloft because was too activating caused intolerable anxiety (only 2 days) Started Trileptal 150 mg BID for mood lability and angry outbursts, at 300 mg BID caused dizziness Started and titrated up to Desvenlafaxine 50mg  daily for MDD, PTSD Started Seroquel 50 mg qHS for PTSD & sleep aid Started and titrated up to prazosin 2 mg qHS for PTSD nightmares Started Buspar 5 mg TID for anxiety and mood lability and angry outbursts  FOLLOW-UP Considerations: PCP: , MD  April at [facility] for continued therapy/counseling Labs: CBC, CMP, Lipid panel, A1c, EKG Provera depot birthcontrol 1/13, 3 month f/u Sleep study referral for sleep apnea and CPAP Consider up titrate Buspar if still anxious Can decrease Seroquel to 25mg   or d/c if patient is too drowsy. Or she can have it PRN once stable.

## 2021-12-08 NOTE — Progress Notes (Addendum)
[facility] MD Progress Note  12/08/2021 3:16 PM Alexandra Gray  MRN: [id number] CC: "I just need to get my depression under control"  Subjective: Alexandra Gray is a 31 y.o. female with PPHx of MDD, Cluster B personality disorder, alcohol use disorder, and PTSD who presented to the ED for strep throat but after admitting SI was transported to [facility] via medical transport for severe episode of recurrent MDD with SI, then admitted to [facility] for management of severe episode of recurrent MDD with SI.  [facility] Day 2   Overnight Events: No acute events overnight or behavioral issues noted. Patient has been compliant with scheduled meds, no agitation PRN's required, and attended group appropriately. CIWAs  . Patient slept 2-3 hours.  Interim History: Patient was pleasant and cooperative on evaluation today. Alexandra Gray stated that they felt more anxious today, sleep was difficult, and appetite was fair. She notes an interaction with another patient yesterday that was challenging, and in which she felt disrespected and threatened. She was unable to sleep well and has found herself fixating on this poor interaction. She notes it took her 3 hours to fall asleep, she has felt restless, anxious, has had racing thoughts. She notes that her anxiety is "just really high right now."  Patient denied SI/HI/AVH, delusions, paranoia, first rank symptoms, and contracted to safety on the unit. Patient was not grossly responding to internal/external stimuli nor made any delusional statements during encounter. Patient had no other concerns.  Diagnosis:  Principal Problem:   Severe episode of recurrent major depressive disorder, without psychotic features ([place]) Active Problems:   Cluster B personality disorder (HCC)   PTSD (post-traumatic stress disorder)   Sleep apnea   MDD (major depressive disorder), recurrent severe, without psychosis ([place])   Total Time spent with patient:  I personally spent 30 minutes  on the unit in direct patient care. The direct patient care time included face-to-face time with the patient, reviewing the patient's chart, communicating with other professionals, and coordinating care. Greater than 50% of this time was spent in counseling or coordinating care with the patient regarding goals of hospitalization, psycho-education, and discharge planning needs.   Past Psychiatric History: See H&P  Past Medical History:  Past Medical History:  Diagnosis Date   Chlamydia    Depression    doing ok now   Gestational diabetes    Pt states she does not have DMII   Gonorrhea    History of pregnancy induced hypertension 12/06/2016   Hx of trichomoniasis    Hypertension    sometimes is up, never on meds   Mental disorder    Ovarian cyst    PID (pelvic inflammatory disease)    Sleep apnea     Past Surgical History:  Procedure Laterality Date   CESAREAN SECTION N/A 10/03/2015   Procedure: CESAREAN SECTION;  Surgeon: Jonnie Kind, MD;  Location: [facility];  Service: Obstetrics;  Laterality: N/A;   CESAREAN SECTION N/A 01/04/2019   Procedure: CESAREAN SECTION;  Surgeon: Chancy Milroy, MD;  Location: [facility];  Service: Obstetrics;  Laterality: N/A;   CESAREAN SECTION N/A    Phreesia 03/08/2021   KNEE SURGERY     Family History:  Family History  Problem Relation Age of Onset   Healthy Mother    Healthy Father    Healthy Brother    Cancer Neg Hx    Diabetes Neg Hx    Heart disease Neg Hx    Hypertension Neg  Hx    Stroke Neg Hx    Hearing loss Neg Hx    Family Psychiatric  History: See H&P Social History:  Social History   Substance and Sexual Activity  Alcohol Use Yes   Comment: 10 weekly     Social History   Substance and Sexual Activity  Drug Use Yes   Types: Marijuana   Comment: 1/8 weekly    Social History   Socioeconomic History   Marital status: Single    Spouse name: Not on file   Number of children: 2   Years of education: 9    Highest education level: Not on file  Occupational History   Occupation: disabled  Tobacco Use   Smoking status: Every Day    Types: Cigars   Smokeless tobacco: Never  Vaping Use   Vaping Use: Never used  Substance and Sexual Activity   Alcohol use: Yes    Comment: 10 weekly   Drug use: Yes    Types: Marijuana    Comment: 1/8 weekly   Sexual activity: Not Currently    Birth control/protection: None  Other Topics Concern   Not on file  Social History Narrative   Lives alone in a one story home.  Has one child.     On disability for depression and PTSD.     Education: 9th grade.    She is in school for criminal justice.   Social Determinants of Health   Financial Resource Strain: Not on file  Food Insecurity: Not on file  Transportation Needs: Not on file  Physical Activity: Not on file  Stress: Not on file  Social Connections: Not on file   Additional Social History:                         Sleep: Per above  Appetite:  Per above  Current Medications: Current Facility-Administered Medications  Medication Dose Route Frequency Provider Last Rate Last Admin   acetaminophen (TYLENOL) tablet 650 mg  650 mg Oral Q6H PRN Rankin, Shuvon B, NP   650 mg at 12/07/21 2125   alum & mag hydroxide-simeth (MAALOX/MYLANTA) 200-200-20 MG/5ML suspension 30 mL  30 mL Oral Q4H PRN Rankin, Shuvon B, NP       magnesium hydroxide (MILK OF MAGNESIA) suspension 30 mL  30 mL Oral Daily PRN Rankin, Shuvon B, NP       sertraline (ZOLOFT) tablet 25 mg  25 mg Oral Daily Rankin, Shuvon B, NP   25 mg at 12/08/21 0735    Lab Results: No results found for this or any previous visit (from the past 48 hour(s)).  Blood Alcohol level:  Lab Results  Component Value Date   ETH <10 12/04/2021   ETH <10 [id number]    Metabolic Disorder Labs: Lab Results  Component Value Date   HGBA1C 5.3 11/03/2020   MPG 105.41 11/03/2020   MPG 105 10/30/2010   No results found for: PROLACTIN Lab  Results  Component Value Date   CHOL 188 06/29/2021   TRIG 177 (H) 06/29/2021   HDL 43 (L) 06/29/2021   CHOLHDL 4.4 06/29/2021   VLDL 29 11/03/2020   LDLCALC 116 (H) 06/29/2021   LDLCALC 95 11/03/2020    Physical Findings: AIMS:   ,  ,   ,   ,     CIWA:    COWS:     Musculoskeletal: Strength & Muscle Tone: within normal limits Gait & Station: normal Patient leans: N/A  Psychiatric Specialty Exam:  Presentation  General Appearance: Appropriate for Environment; Fairly Groomed   Eye Contact:Good   Speech:Clear and Coherent   Speech Volume:Normal   Handedness:No data recorded   Mood and Affect  Mood:Anxious; Depressed   Affect:Appropriate; Congruent    Thought Process  Thought Processes:Coherent   Descriptions of Associations:Intact   Orientation:Full (Time, Place and Person)   Thought Content:Logical   History of Schizophrenia/Schizoaffective disorder:No   Duration of Psychotic Symptoms:No data recorded Hallucinations:Hallucinations: None   Ideas of Reference:None   Suicidal Thoughts:Suicidal Thoughts: Yes, Passive SI Passive Intent and/or Plan: Without Intent; Without Plan   Homicidal Thoughts:Homicidal Thoughts: No    Sensorium  Memory:Immediate Good; Recent Good; Remote Good   Judgment:Fair   Insight:Fair    Executive Functions  Concentration:Good   Attention Span:Good   Recall:Good   Fund of Knowledge:Good   Language:Good    Psychomotor Activity  Psychomotor Activity:Psychomotor Activity: Normal  No restlessness, stiffness, cogwheeling, waxy flexibility, tremors, tics, gait abnormality, nor involuntary movement of head, face, mouth or trunk noted on encounter. AIMs 0   Assets  Assets:Desire for Improvement; Communication Skills    Sleep  Sleep: 2-3 hours    Physical Exam: Physical Exam Vitals and nursing note reviewed.  Constitutional:      General: She is not in acute distress.     Appearance: She is not ill-appearing or diaphoretic.  HENT:     Head: Normocephalic.  Pulmonary:     Effort: Pulmonary effort is normal.  Neurological:     General: No focal deficit present.     Mental Status: She is alert.   Review of Systems  Respiratory:  Negative for shortness of breath.   Cardiovascular:  Negative for chest pain.  Gastrointestinal:  Negative for nausea and vomiting.  Neurological:  Negative for dizziness and headaches.  Blood pressure 136/89, pulse 78, temperature (!) 97.5 F (36.4 C), temperature source Oral, resp. rate 16, height 5' 1.5" (1.562 m), weight (!) 141.3 kg, last menstrual period 11/15/2021, SpO2 100 %. Body mass index is 57.92 kg/m.   Treatment Plan & Assessment Summary: Principal Problem:   Severe episode of recurrent major depressive disorder, without psychotic features ([place]) Active Problems:   Cluster B personality disorder ([place])   PTSD (post-traumatic stress disorder)   Sleep apnea   MDD (major depressive disorder), recurrent severe, without psychosis ([place])   NALAH HAAN is a 31 y.o. female with MDD, PTSD, alcohol use disorder, and Cluster B personality disorder who is admitted for recurrent MDD with SI. [facility] Day 2   PLAN: Severe episode of recurrent major depressive disorder, without psychotic features ([place]) Will switch from SSRI to SNRI due to activation symptoms. Overall, depression symptoms are improving with patient now denying any SI. -Will begin 50mg  Desvenlafaxine tomorrow morning. -Encouraged sleep hygiene -Melatonin 3 mg nightly  Alcohol use disorder Exacerbated by recurrent MDD for past two weeks. Pt without withdrawal symptoms, not expected due to acuity of recent alcohol abuse, but will continue to monitor. -Will continue providing brief intervention, abstinence advised.  PTSD Currently not frequently symptomatic or intrusive in daily life, reporting nightmares about once a month.  -Encouraging f/u with outpatient  counselor/therapist at [facility] per above  Cluster B Personality Disorder Recommend DBT or CBT with discharge.   Dispo: 2-3 days  Safety, monitoring and disposition planning: The patient was seen and evaluated on the unit.  The patient's chart was reviewed and nursing notes were reviewed.  The patient's case was  discussed in multidisciplinary team meeting.  Plan and drug side effects were discussed with patient who was amendable. Social work and case management to assist with discharge planning and identification of hospital follow-up needs prior to discharge. Discharge Concerns: Need to establish a safety plan; Medication compliance and effectiveness Discharge Goals: Return home with outpatient referrals for mental health follow-up including medication management/psychotherapy Safety and Monitoring: Involuntary status at inpatient psychiatric unit for safety, stabilization and treatment Daily contact with patient to assess and evaluate symptoms and progress in treatment and medical management Patient's case to be discussed in multi-disciplinary team meeting Observation Level: Per above Vital signs:  q12 hours Precautions: suicide   Signed: [facility] 12/08/2021, 3:16 PM

## 2021-12-08 NOTE — Progress Notes (Signed)
°   12/08/21 0100  Psych Admission Type (Psych Patients Only)  Admission Status Voluntary  Psychosocial Assessment  Patient Complaints Depression  Eye Contact Brief  Facial Expression Sad  Affect Depressed  Speech Logical/coherent  Interaction Minimal  Motor Activity Other (Comment) (wnl)  Appearance/Hygiene Unremarkable  Behavior Characteristics Cooperative  Mood Depressed  Thought Process  Coherency WDL  Content WDL  Delusions WDL  Perception WDL  Hallucination None reported or observed  Judgment WDL  Confusion None  Danger to Self  Current suicidal ideation? Denies  Danger to Others  Danger to Others None reported or observed   Patient denies SI/HI/A/VH and verbally contracted for safety. Interacting well with Public relations account executive. Support and encouragement provided.

## 2021-12-08 NOTE — Plan of Care (Signed)
Nurse discussed anxiety, depression and anxiety with patient.  

## 2021-12-08 NOTE — Progress Notes (Addendum)
D:  Patient's self inventory sheet, patient has fair sleep, no sleep medication given.  Fair appetite, low energy level, good concentration.  Rated depression 6, denied hopeless, anxiety 3.  Denied withdrawals.  Denied SI.  Denied physical problems.  Denied physical pain.  Pain medicine helpful.  Plans to control her anger and frustration.  Plans to express  thoughts, feelings.  No discharge plans. A:  Medications administered per MD orders.  Emotional support and encouragement given patient. R:  Denied SI and HI, contracts for safety.  Denied A/V hallucinations.  Safety maintained with 15 minute checks.

## 2021-12-08 NOTE — Progress Notes (Signed)
[facility] Group Notes:  (Nursing/MHT/Case Management/Adjunct)  Date:  12/08/2021  Time:  2015 Type of Therapy:   wrap up group  Participation Level:  Active  Participation Quality:  Appropriate, Attentive, Sharing, and Supportive  Affect:  Appropriate  Cognitive:  Alert  Insight:  Improving  Engagement in Group:  Engaged  Modes of Intervention:  Clarification, Education, and Support  Summary of Progress/Problems: Positive thinking and positive change were discussed.   Alexandra Gray 12/08/2021, 9:18 PM

## 2021-12-09 MED ORDER — AMLODIPINE BESYLATE 5 MG PO TABS
5.0000 mg | ORAL_TABLET | Freq: Every day | ORAL | Status: DC
  Administered 2021-12-09 – 2021-12-16 (×8): 5 mg via ORAL
  Filled 2021-12-09 (×14): qty 1

## 2021-12-09 MED ORDER — OXCARBAZEPINE 150 MG PO TABS
150.0000 mg | ORAL_TABLET | Freq: Two times a day (BID) | ORAL | Status: DC
  Administered 2021-12-09 – 2021-12-11 (×5): 150 mg via ORAL
  Filled 2021-12-09 (×9): qty 1

## 2021-12-09 MED ORDER — MEDROXYPROGESTERONE ACETATE 150 MG/ML IM SUSP
150.0000 mg | Freq: Once | INTRAMUSCULAR | Status: AC
Start: 2021-12-09 — End: 2021-12-09
  Administered 2021-12-09: 150 mg via INTRAMUSCULAR
  Filled 2021-12-09: qty 1

## 2021-12-09 NOTE — Progress Notes (Signed)
°   12/09/21 2150  Psych Admission Type (Psych Patients Only)  Admission Status Voluntary  Psychosocial Assessment  Patient Complaints None  Eye Contact Brief  Facial Expression Flat  Affect Appropriate to circumstance  Speech Logical/coherent  Interaction Minimal  Motor Activity Other (Comment) (WDL)  Appearance/Hygiene Unremarkable  Behavior Characteristics Appropriate to situation  Mood Depressed  Thought Process  Coherency WDL  Content WDL  Delusions None reported or observed  Perception WDL  Hallucination None reported or observed  Judgment WDL  Confusion None  Danger to Self  Current suicidal ideation? Denies  Danger to Others  Danger to Others None reported or observed

## 2021-12-09 NOTE — Progress Notes (Signed)
D Patient denies SI/HI/AVH and verbally contracted for safety interacting well with Peers and Staff and compliant with treatment plan.  A Scheduled medications administered per Provider order. Support and encouragement provided. Routine safety checks conducted every 15 minutes. Patient notified to inform staff with problems or concerns.  R. No adverse drug reactions noted. Patient contracts for safety at this time. Will continue to monitor.

## 2021-12-09 NOTE — Progress Notes (Cosign Needed)
[facility] MD Progress Note  12/09/2021 10:25 AM Alexandra Gray  MRN: [id number] CC: "I just need to get my depression under control"  Subjective: Alexandra Gray is a 31 y.o. female with PPHx of  MDD, Alcohol use disorder, PTSD, and Cluster B personality disorder who presented to the ED for strep throat but after admitting to SI was transported to [facility] via medical transport for severe recurrent MDD with SI.  [place] Day 3   Yesterday's recommendation by the Psychiatry team Switched from Zoloft 25mg  to Desvenlafaxine 50mg  Began Melatonin 3mg  nightly   Interim History: Patient was pleasant and cooperative on evaluation today. Alexandra Gray stated that they felt "just really down today," sleep was restless, and appetite was normal.  Alexandra Gray notes that she woke up feeling as though she could cry, and spent some of her morning crying in the shower. She notes that her anxiety is better than it was yesterday, but her depression feels worse. She endorses thoughts concentrating on feeling like a failure, some of which she notes is influenced by comparisons with her family. She agrees to making a list of ways she feels like a failure to be discussed tomorrow with her provider team. Still motivated to improve and appreciates help she is being given. Denies feeling hopeless and is still participating actively in groups.  Patient denied medication side effects and is tolerating it well. Patient denied SI/HI/AVH, delusions, paranoia, first rank symptoms, and contracted to safety on the unit. Patient was not grossly responding to internal/external stimuli nor made any delusional statements during encounter. Patient had no other concerns.  Diagnosis:  Principal Problem:   Severe episode of recurrent major depressive disorder, without psychotic features (Alexandra Gray) Active Problems:   Cluster B personality disorder (HCC)   PTSD (post-traumatic stress disorder)   Sleep apnea   MDD (major depressive disorder), recurrent  severe, without psychosis ([place])   Total Time spent with patient:  I personally spent 30 minutes on the unit in direct patient care. The direct patient care time included face-to-face time with the patient, reviewing the patient's chart, communicating with other professionals, and coordinating care. Greater than 50% of this time was spent in counseling or coordinating care with the patient regarding goals of hospitalization, psycho-education, and discharge planning needs.   Past Psychiatric History: See H&P  Past Medical History:  Past Medical History:  Diagnosis Date   Chlamydia    Depression    doing ok now   Gestational diabetes    Pt states she does not have DMII   Gonorrhea    History of pregnancy induced hypertension 12/06/2016   Hx of trichomoniasis    Hypertension    sometimes is up, never on meds   Mental disorder    Ovarian cyst    PID (pelvic inflammatory disease)    Sleep apnea     Past Surgical History:  Procedure Laterality Date   CESAREAN SECTION N/A 10/03/2015   Procedure: CESAREAN SECTION;  Surgeon: Jonnie Kind, MD;  Location: [facility];  Service: Obstetrics;  Laterality: N/A;   CESAREAN SECTION N/A 01/04/2019   Procedure: CESAREAN SECTION;  Surgeon: Chancy Milroy, MD;  Location: [facility];  Service: Obstetrics;  Laterality: N/A;   CESAREAN SECTION N/A    Phreesia 03/08/2021   KNEE SURGERY     Family History:  Family History  Problem Relation Age of Onset   Healthy Mother    Healthy Father    Healthy Brother    Cancer Neg Hx  Diabetes Neg Hx    Heart disease Neg Hx    Hypertension Neg Hx    Stroke Neg Hx    Hearing loss Neg Hx    Family Psychiatric  History: See H&P Social History:  Social History   Substance and Sexual Activity  Alcohol Use Yes   Comment: 10 weekly     Social History   Substance and Sexual Activity  Drug Use Yes   Types: Marijuana   Comment: 1/8 weekly    Social History   Socioeconomic History   Marital  status: Single    Spouse name: Not on file   Number of children: 2   Years of education: 9   Highest education level: Not on file  Occupational History   Occupation: disabled  Tobacco Use   Smoking status: Every Day    Types: Cigars   Smokeless tobacco: Never  Vaping Use   Vaping Use: Never used  Substance and Sexual Activity   Alcohol use: Yes    Comment: 10 weekly   Drug use: Yes    Types: Marijuana    Comment: 1/8 weekly   Sexual activity: Not Currently    Birth control/protection: None  Other Topics Concern   Not on file  Social History Narrative   Lives alone in a one story home.  Has one child.     On disability for depression and PTSD.     Education: 9th grade.    She is in school for criminal justice.   Social Determinants of Health   Financial Resource Strain: Not on file  Food Insecurity: Not on file  Transportation Needs: Not on file  Physical Activity: Not on file  Stress: Not on file  Social Connections: Not on file   Additional Social History:                         Sleep: Per above  Appetite:  Per above  Current Medications: Current Facility-Administered Medications  Medication Dose Route Frequency Provider Last Rate Last Admin   acetaminophen (TYLENOL) tablet 650 mg  650 mg Oral Q6H PRN Rankin, Shuvon B, NP   650 mg at 12/07/21 2125   alum & mag hydroxide-simeth (MAALOX/MYLANTA) 200-200-20 MG/5ML suspension 30 mL  30 mL Oral Q4H PRN Rankin, Shuvon B, NP       amLODipine (NORVASC) tablet 5 mg  5 mg Oral Daily Merrily Brittle, DO   5 mg at 12/09/21 0901   desvenlafaxine (PRISTIQ) 24 hr tablet 50 mg  50 mg Oral Daily Merrily Brittle, DO   50 mg at 12/09/21 [id number]   magnesium hydroxide (MILK OF MAGNESIA) suspension 30 mL  30 mL Oral Daily PRN Rankin, Shuvon B, NP       melatonin tablet 3 mg  3 mg Oral QHS Merrily Brittle, DO   3 mg at 12/08/21 2100    Lab Results:  Results for orders placed or performed during the hospital encounter of 12/06/21  (from the past 48 hour(s))  Lipid panel     Status: Abnormal   Collection Time: 12/08/21  6:45 PM  Result Value Ref Range   Cholesterol 159 0 - 200 mg/dL   Triglycerides 177 (H) <150 mg/dL   HDL 34 (L) >40 mg/dL   Total CHOL/HDL Ratio 4.7 RATIO   VLDL 35 0 - 40 mg/dL   LDL Cholesterol 90 0 - 99 mg/dL    Comment:        Total Cholesterol/HDL:CHD  Risk Coronary Heart Disease Risk Table                     Men   Women  1/2 Average Risk   3.4   3.3  Average Risk       5.0   4.4  2 X Average Risk   9.6   7.1  3 X Average Risk  23.4   11.0        Use the calculated Patient Ratio above and the CHD Risk Table to determine the patient's CHD Risk.        ATP III CLASSIFICATION (LDL):  <100     mg/dL   Optimal  100-129  mg/dL   Near or Above                    Optimal  130-159  mg/dL   Borderline  160-189  mg/dL   High  >190     mg/dL   Very High Performed at [facility] [address], [place], [place] [address]   Hemoglobin A1c     Status: None   Collection Time: 12/08/21  6:45 PM  Result Value Ref Range   Hgb A1c MFr Bld 5.3 4.8 - 5.6 %    Comment: (NOTE) Pre diabetes:          5.7%-6.4%  Diabetes:              >6.4%  Glycemic control for   <7.0% adults with diabetes    Mean Plasma Glucose 105.41 mg/dL    Comment: Performed at [facility] [address], [place], [place] [address]    Blood Alcohol level:  Lab Results  Component Value Date   ETH <10 12/04/2021   ETH <10 [id number]    Metabolic Disorder Labs: Lab Results  Component Value Date   HGBA1C 5.3 12/08/2021   MPG 105.41 12/08/2021   MPG 105.41 11/03/2020   No results found for: PROLACTIN Lab Results  Component Value Date   CHOL 159 12/08/2021   TRIG 177 (H) 12/08/2021   HDL 34 (L) 12/08/2021   CHOLHDL 4.7 12/08/2021   VLDL 35 12/08/2021   LDLCALC 90 12/08/2021   LDLCALC 116 (H) 06/29/2021    Physical Findings: AIMS:   ,  ,   ,   ,     CIWA:    COWS:      Musculoskeletal: Strength & Muscle Tone: within normal limits Gait & Station: normal Patient leans: N/A  Psychiatric Specialty Exam:  Presentation  General Appearance: Appropriate for Environment; Fairly Groomed   Eye Contact:Good   Speech:Clear and Coherent   Speech Volume:Normal   Handedness:No data recorded   Mood and Affect  Mood:Anxious; Depressed   Affect:Appropriate; Congruent    Thought Process  Thought Processes:Coherent   Descriptions of Associations:Intact   Orientation:Full (Time, Place and Person)   Thought Content:Logical   History of Schizophrenia/Schizoaffective disorder:No   Duration of Psychotic Symptoms:No data recorded Hallucinations:No data recorded  Ideas of Reference:None   Suicidal Thoughts:No data recorded  Homicidal Thoughts:No data recorded   Sensorium  Memory:Immediate Good; Recent Good; Remote Good   Judgment:Fair   Insight:Fair    Executive Functions  Concentration:Good   Attention Span:Good   [place] of Knowledge:Good   Language:Good    Psychomotor Activity  Psychomotor Activity:No data recorded No restlessness, stiffness, cogwheeling, waxy flexibility, tremors, tics, gait abnormality, nor involuntary movement of head, face, mouth  or trunk noted on encounter. AIMs 0  Assets  Assets:Desire for Improvement; Communication Skills    Sleep  Sleep: 3-4 hours   Physical Exam: Physical Exam Constitutional:      Appearance: Normal appearance.  HENT:     Head: Normocephalic.  Eyes:     General: No scleral icterus.    Conjunctiva/sclera: Conjunctivae normal.  Pulmonary:     Effort: Pulmonary effort is normal.  Skin:    General: Skin is warm and dry.  Neurological:     General: No focal deficit present.     Mental Status: She is alert and oriented to person, place, and time. Mental status is at baseline.  Psychiatric:        Attention and Perception: Attention and  perception normal. She does not perceive auditory or visual hallucinations.        Mood and Affect: Mood is depressed. Affect is tearful.        Speech: Speech normal.        Behavior: Behavior is cooperative.        Thought Content: Thought content is not delusional. Thought content does not include homicidal or suicidal ideation. Thought content does not include homicidal or suicidal plan.        Cognition and Memory: Cognition normal.   Review of Systems  Cardiovascular:  Negative for chest pain and palpitations.  Psychiatric/Behavioral:  Positive for depression. Negative for hallucinations and suicidal ideas. The patient has insomnia.   Blood pressure 120/78, pulse 88, temperature 98 F (36.7 C), temperature source Oral, resp. rate 20, height 5' 1.5" (1.562 m), weight (!) 141.3 kg, last menstrual period 11/15/2021, SpO2 100 %. Body mass index is 57.92 kg/m.   Treatment Plan & Assessment Summary: Principal Problem:   Severe episode of recurrent major depressive disorder, without psychotic features ([place]) Active Problems:   Cluster B personality disorder ([place])   PTSD (post-traumatic stress disorder)   Sleep apnea   MDD (major depressive disorder), recurrent severe, without psychosis ([place])   Alexandra Gray is a 31 y.o. female with MDD, PTSD, alcohol use disorder, and Cluster B personality disorder who is admitted for recurrent MDD with SI. [facility] Day 3   PLAN: Severe recurrent MDD episode with SI Overall, maintained improvement in resolution of suicidal ideation however pt is more tearful today and endorses feeling more depressed. Still has difficulty sleeping. Will add on Oxcarbazepine to help stabilize mood. -Adding Oxcarbazepine 150mg  BID -Continue Pristiq 50mg  -Continue Melatonin 3mg   Alcohol abuse disorder Exacerbated by recurrent MDD for past two weeks. Pt without withdrawal symptoms but has had worsened HTN in the last day so will place on CIWA without benzodiazepine  protocol. Will continue monitoring. -CIWA protocol without benzodiazepines.  -Will continue providing brief intervention and advising alcohol cessation.   Chronic Hypertension Pressures have ranged 140-188/ 80-111 in the last day. Previously diagnosed and took Hydrochlorothiazide before developing an allergic reaction defined with urticaria. Was not restarted on BP medication. A1C was normal, and last Urine P:CR was normal. -Will start Amlodipine 5mg   Contraception desire Pt denies wanting any more children and would like contraception options. She has never had a blood clot before and has poor experience with Nexplanon in the past. Would like to return to Depo injections which she previously had. Has not had intercourse since her last menses.  -Will start Depo-Provera today -Discussed need for repeat injection in 3 months with PCP  PTSD Currently not frequently symptomatic but certainly contributes to overall picture of  MDD and anxiety. Reports nightmares about once a month. -Encouraging f/u with outpatient counselor/therapist at [facility] where she has recently been seen  -Meds per above  Cluster B Personality Disorder Recommend DBT or CBT with discharge  Dispo: 2-3 days Will return home with Godmother and two children  Safety, monitoring and disposition planning: The patient was seen and evaluated on the unit.  The patient's chart was reviewed and nursing notes were reviewed.  The patient's case was discussed in multidisciplinary team meeting.  Plan and drug side effects were discussed with patient who was amendable. Social work and case management to assist with discharge planning and identification of hospital follow-up needs prior to discharge. Discharge Concerns: Need to establish a safety plan; Medication compliance and effectiveness Discharge Goals: Return home with outpatient referrals for mental health follow-up including medication management/psychotherapy Safety and  Monitoring: Involuntary status at inpatient psychiatric unit for safety, stabilization and treatment Daily contact with patient to assess and evaluate symptoms and progress in treatment and medical management Patient's case to be discussed in multi-disciplinary team meeting Observation Level: Per above Vital signs:  q12 hours Precautions: suicide   Signed: [facility] 12/09/2021, 10:25 AM

## 2021-12-09 NOTE — Group Note (Unsigned)
Date:  12/09/2021 °Time:  4:11 PM ° °Group Topic/Focus:  °Orientation:   The focus of this group is to educate the patient on the purpose and policies of crisis stabilization and provide a format to answer questions about their admission.  The group details unit policies and expectations of patients while admitted. ° ° ° ° °Participation Level:  {[facility] PARTICIPATION LEVEL:22264} ° °Participation Quality:  {[facility] PARTICIPATION QUALITY:22265} ° °Affect:  {[facility] AFFECT:22266} ° °Cognitive:  {[facility] COGNITIVE:22267} ° °Insight: {[facility] Insight2:20797} ° °Engagement in Group:  {[facility] ENGAGEMENT IN GROUP:22268} ° °Modes of Intervention:  {[facility] MODES OF INTERVENTION:22269} ° °Additional Comments:  *** ° °Kaeleen Odom D Roslind Michaux °12/09/2021, 4:11 PM ° °

## 2021-12-09 NOTE — Group Note (Signed)
[facility] LCSW Group Therapy Note ° ° °Group Date: 12/09/2021 °Start Time: 1300 °End Time: 1400 ° ° °Type of Therapy/Topic:  Group Therapy:  Balance in Life ° °Participation Level:  Active  ° °Description of Group:   ° This group will address the concept of balance and how it feels and looks when one is unbalanced. Patients will be encouraged to process areas in their lives that are out of balance, and identify reasons for remaining unbalanced. Facilitators will guide patients utilizing problem- solving interventions to address and correct the stressor making their life unbalanced. Understanding and applying boundaries will be explored and addressed for obtaining  and maintaining a balanced life. Patients will be encouraged to explore ways to assertively make their unbalanced needs known to significant others in their lives, using other group members and facilitator for support and feedback. ° °Therapeutic Goals: °Patient will identify two or more emotions or situations they have that consume much of in their lives. °Patient will identify signs/triggers that life has become out of balance:  °Patient will identify two ways to set boundaries in order to achieve balance in their lives:  °Patient will demonstrate ability to communicate their needs through discussion and/or role plays ° °Summary of Patient Progress: ° ° ° °Pt participated in group and remained there the entire time.  The Pt accepted the worksheet that was provided and followed along throughout the group session.  The Pt discussed things that are in their control and out of their control.  The Pt was appropriate and maintained a positive mood.  ° ° ° °Therapeutic Modalities:   °Cognitive Behavioral Therapy °Solution-Focused Therapy °Assertiveness Training ° ° °Sherran Margolis M Lilybeth Vien, LCSWA °

## 2021-12-09 NOTE — Progress Notes (Signed)
Adult Psychoeducational Group Note  Date:  12/09/2021 Time:  9:36 PM  Group Topic/Focus:  Wrap-Up Group:   The focus of this group is to help patients review their daily goal of treatment and discuss progress on daily workbooks.  Participation Level:  Active  Participation Quality:  Appropriate  Affect:  Appropriate  Cognitive:  Appropriate  Insight: Appropriate  Engagement in Group:  Engaged  Modes of Intervention:  Discussion  Additional Comments:  patient attend AA meeting   Charna Busman Long 12/09/2021, 9:36 PM

## 2021-12-09 NOTE — Group Note (Signed)
Date:  12/09/2021 Time:  4:18 PM  Group Topic/Focus:  Orientation:   The focus of this group is to educate the patient on the purpose and policies of crisis stabilization and provide a format to answer questions about their admission.  The group details unit policies and expectations of patients while admitted.    Participation Level:  Active  Participation Quality:  Appropriate  Affect:  Appropriate  Cognitive:  Appropriate  Insight: Appropriate  Engagement in Group:  Engaged  Modes of Intervention:  Discussion  Additional Comments:    Jerrye Beavers 12/09/2021, 4:18 PM

## 2021-12-09 NOTE — Progress Notes (Signed)
Pt A & O X4. Reports poor sleep last night even after taking Melatonin last night "I was very restless, really fidgety and couldn't sleep. Now I feel really drowsy" on assessment earlier this shift. Pt reports racing thought "My mind is just all over the place, I'm worried about my children. I feel like crying, I want to yell but I can't. I want to do better for my kids. I really do and learn to cope with my feelings that's why I'm here".  Observed with crying spells at intervals during shift. Received PRN Tylenol 650 mg PO at 1640 for c/o headache 8/10 and reported relief 0/10 when reassessed at 1730. Last CIWA score at noon was 3 which pt attributes to being "Just anxious about everything". Visible in dayroom majority of this shift, attends scheduled groups, engages in activities on and off unit with peers. Emotional support, reassurance and encouragement provided to pt this shift. All medications administered with verbal education and effects monitor. Provider notified on continued anxiety. Safety checks maintained at Q 15 minutes intervals without self harm gestures. Tolerates meals and medications, meals and fluids well without discomfort. Getting needs met safely in milieu.

## 2021-12-09 NOTE — [facility] Group Notes (Signed)
[facility] Group Notes:  (Nursing/MHT/Case Management/Adjunct)  Date:  12/09/2021  Time:  11:27 AM  Type of Therapy:  Psychoeducational Skills  Participation Level:  Active  Participation Quality:  Appropriate  Affect:  Appropriate  Cognitive:  Appropriate  Insight:  Good  Engagement in Group:  Engaged  Modes of Intervention:  Education  Summary of Progress/Problems: Educated patient in ways to manage judgements about herself. Patient was able to express understanding of the need to manage her emotions as evidenced by sharing that reducing her self-judgment could help her slow down negative emotions.   Reymundo Poll 12/09/2021, 11:27 AM

## 2021-12-09 NOTE — Group Note (Signed)
Recreation Therapy Group Note   Group Topic:Stress Management  Group Date: 12/09/2021 Start Time: 0930 End Time: 0945 Facilitators: Caroll Rancher, LRT,CTRS Location: [address] Dayroom   Goal Area(s) Addresses:  Patient will actively participate in stress management techniques presented during session.  Patient will successfully identify benefit of practicing stress management post d/c.   Group Description: Guided Imagery. LRT provided education, instruction, and demonstration on practice of visualization via guided imagery. Patient was asked to participate in the technique introduced during session. LRT debriefed including topics of mindfulness, stress management and specific scenarios each patient could use these techniques. Patients were given suggestions of ways to access scripts post d/c and encouraged to explore Youtube and other apps available on smartphones, tablets, and computers.   Affect/Mood: N/A   Participation Level: Did not attend    Clinical Observations/Individualized Feedback:     Plan: Continue to engage patient in RT group sessions 2-3x/week.   Caroll Rancher, LRT,CTRS 12/09/2021 11:03 AM

## 2021-12-10 MED ORDER — IBUPROFEN 400 MG PO TABS
400.0000 mg | ORAL_TABLET | Freq: Four times a day (QID) | ORAL | Status: DC | PRN
  Administered 2021-12-13: 400 mg via ORAL
  Filled 2021-12-10: qty 1

## 2021-12-10 NOTE — Progress Notes (Signed)
°   12/10/21 2225  Psych Admission Type (Psych Patients Only)  Admission Status Voluntary  Psychosocial Assessment  Patient Complaints None  Eye Contact Brief  Facial Expression Flat  Affect Appropriate to circumstance  Speech Logical/coherent  Interaction Minimal  Motor Activity Slow  Appearance/Hygiene Unremarkable  Behavior Characteristics Appropriate to situation  Mood Depressed  Thought Process  Coherency WDL  Content WDL  Delusions None reported or observed  Perception WDL  Hallucination None reported or observed  Judgment Limited  Confusion None  Danger to Self  Current suicidal ideation? Denies  Danger to Others  Danger to Others None reported or observed

## 2021-12-10 NOTE — Progress Notes (Signed)
[facility] MD Progress Note  12/10/2021 3:33 PM Alexandra Gray  MRN:  [id number]  Alexandra Gray is a 31 y.o. female  PPHx of MDD and PTSD, who presented to [facility] for recurrent MDD with SI, now a(n) Voluntary  admit to [facility] for treatment of MDD with SI.  Patient case reviewed with team during progression rounds this morning. No further peer conflicts. She is attending groups. She is pleasant with staff and medication compliant.   Subjective:  I spoke with Lincy today during rounds. She was pensive and more thoughtful than usual. She called her mother but did not get an answer, and is a little conflicted about how she feels about it. She states her mood is "I have my moments", and includes that she has had some tearful episodes. She is not entirely sure if this is a bad thing. She slept some, but it was broken. She is not as restless and anxious as yesterday. She has been spending today "trying to challenge my emotions", which is unusually introspective for her as well. She denies suicidal/homicidal ideation, hallucinations. No run-ins with her peers.   Principal Problem: Severe episode of recurrent major depressive disorder, without psychotic features ([place]) Diagnosis: Principal Problem:   Severe episode of recurrent major depressive disorder, without psychotic features ([place]) Active Problems:   Cluster B personality disorder ([place])   PTSD (post-traumatic stress disorder)   Sleep apnea   MDD (major depressive disorder), recurrent severe, without psychosis ([place])  Total Time spent with patient: 20 minutes  Past Psychiatric History:  Previous Psych Diagnoses: MDD, PTSD Prior inpatient treatment: for MDD Current/prior outpatient treatment: None Prior rehab hx: None Psychotherapy hx: None History of suicide: One prior attempt in 2021 with Trazodone. History of homicide: None Psychiatric medication history: Depakote and Zoloft for a couple years until deciding to stop in summer of  2022. Neuromodulation history: Unknown Current Psychiatrist: None Current therapist: None  Past Medical History:  Past Medical History:  Diagnosis Date   Chlamydia    Depression    doing ok now   Gestational diabetes    Pt states she does not have DMII   Gonorrhea    History of pregnancy induced hypertension 12/06/2016   Hx of trichomoniasis    Hypertension    sometimes is up, never on meds   Mental disorder    Ovarian cyst    PID (pelvic inflammatory disease)    Sleep apnea     Past Surgical History:  Procedure Laterality Date   CESAREAN SECTION N/A 10/03/2015   Procedure: CESAREAN SECTION;  Surgeon: Jonnie Kind, MD;  Location: [facility];  Service: Obstetrics;  Laterality: N/A;   CESAREAN SECTION N/A 01/04/2019   Procedure: CESAREAN SECTION;  Surgeon: Chancy Milroy, MD;  Location: [facility];  Service: Obstetrics;  Laterality: N/A;   CESAREAN SECTION N/A    Phreesia 03/08/2021   KNEE SURGERY     Family History:  Family History  Problem Relation Age of Onset   Healthy Mother    Healthy Father    Healthy Brother    Cancer Neg Hx    Diabetes Neg Hx    Heart disease Neg Hx    Hypertension Neg Hx    Stroke Neg Hx    Hearing loss Neg Hx    Family Psychiatric  History:  Psych: None Psych Rx: None or unknown. SA/HA: None Substance use family hx: Biological mother "used drugs, but don't know what." Social History:  Social  History   Substance and Sexual Activity  Alcohol Use Yes   Comment: 10 weekly     Social History   Substance and Sexual Activity  Drug Use Yes   Types: Marijuana   Comment: 1/8 weekly    Social History   Socioeconomic History   Marital status: Single    Spouse name: Not on file   Number of children: 2   Years of education: 9   Highest education level: Not on file  Occupational History   Occupation: disabled  Tobacco Use   Smoking status: Every Day    Types: Cigars   Smokeless tobacco: Never  Vaping Use   Vaping Use:  Never used  Substance and Sexual Activity   Alcohol use: Yes    Comment: 10 weekly   Drug use: Yes    Types: Marijuana    Comment: 1/8 weekly   Sexual activity: Not Currently    Birth control/protection: None  Other Topics Concern   Not on file  Social History Narrative   Lives alone in a one story home.  Has one child.     On disability for depression and PTSD.     Education: 9th grade.    She is in school for criminal justice.   Social Determinants of Health   Financial Resource Strain: Not on file  Food Insecurity: Not on file  Transportation Needs: Not on file  Physical Activity: Not on file  Stress: Not on file  Social Connections: Not on file   Additional Social History:    Marital status: Single Are you sexually active?: Yes What is your sexual orientation?: heterosexual Has your sexual activity been affected by drugs, alcohol, medication, or emotional stress?: none reported Does patient have children?: Yes How many children?: 2 How is patient's relationship with their children?: Patient states, "we are good, we are attached"       Sleep: Poor  Appetite:  Fair  Current Medications: Current Facility-Administered Medications  Medication Dose Route Frequency Provider Last Rate Last Admin   acetaminophen (TYLENOL) tablet 650 mg  650 mg Oral Q6H PRN Rankin, Shuvon B, NP   650 mg at 12/10/21 0611   alum & mag hydroxide-simeth (MAALOX/MYLANTA) 200-200-20 MG/5ML suspension 30 mL  30 mL Oral Q4H PRN Rankin, Shuvon B, NP       amLODipine (NORVASC) tablet 5 mg  5 mg Oral Daily Merrily Brittle, DO   5 mg at 12/10/21 [id number]   desvenlafaxine (PRISTIQ) 24 hr tablet 50 mg  50 mg Oral Daily Merrily Brittle, DO   50 mg at 12/10/21 0744   ibuprofen (ADVIL) tablet 400 mg  400 mg Oral Q6H PRN Bobbitt, Shalon E, NP       magnesium hydroxide (MILK OF MAGNESIA) suspension 30 mL  30 mL Oral Daily PRN Rankin, Shuvon B, NP       melatonin tablet 3 mg  3 mg Oral QHS Merrily Brittle, DO   3 mg  at 12/08/21 2100   OXcarbazepine (TRILEPTAL) tablet 150 mg  150 mg Oral BID Merrily Brittle, DO   150 mg at 12/10/21 [id number]    Lab Results:  Results for orders placed or performed during the hospital encounter of 12/06/21 (from the past 48 hour(s))  Lipid panel     Status: Abnormal   Collection Time: 12/08/21  6:45 PM  Result Value Ref Range   Cholesterol 159 0 - 200 mg/dL   Triglycerides 177 (H) <150 mg/dL   HDL 34 (L) >40 mg/dL  Total CHOL/HDL Ratio 4.7 RATIO   VLDL 35 0 - 40 mg/dL   LDL Cholesterol 90 0 - 99 mg/dL    Comment:        Total Cholesterol/HDL:CHD Risk Coronary Heart Disease Risk Table                     Men   Women  1/2 Average Risk   3.4   3.3  Average Risk       5.0   4.4  2 X Average Risk   9.6   7.1  3 X Average Risk  23.4   11.0        Use the calculated Patient Ratio above and the CHD Risk Table to determine the patient's CHD Risk.        ATP III CLASSIFICATION (LDL):  <100     mg/dL   Optimal  100-129  mg/dL   Near or Above                    Optimal  130-159  mg/dL   Borderline  160-189  mg/dL   High  >190     mg/dL   Very High Performed at [facility] [address] [place], [place] [address]   Hemoglobin A1c     Status: None   Collection Time: 12/08/21  6:45 PM  Result Value Ref Range   Hgb A1c MFr Bld 5.3 4.8 - 5.6 %    Comment: (NOTE) Pre diabetes:          5.7%-6.4%  Diabetes:              >6.4%  Glycemic control for   <7.0% adults with diabetes    Mean Plasma Glucose 105.41 mg/dL    Comment: Performed at [facility] [address] [place], [place] [address]    Blood Alcohol level:  Lab Results  Component Value Date   [facility] <10 12/04/2021   ETH <10 [id number]    Metabolic Disorder Labs: Lab Results  Component Value Date   HGBA1C 5.3 12/08/2021   MPG 105.41 12/08/2021   MPG 105.41 11/03/2020   No results found for: PROLACTIN Lab Results  Component Value Date   CHOL 159 12/08/2021   TRIG 177  (H) 12/08/2021   HDL 34 (L) 12/08/2021   CHOLHDL 4.7 12/08/2021   VLDL 35 12/08/2021   LDLCALC 90 12/08/2021   LDLCALC 116 (H) 06/29/2021    Physical Findings: AIMS:  , ,  ,  ,    CIWA:  CIWA-Ar Total: 0 COWS:     Musculoskeletal: Strength & Muscle Tone: within normal limits Gait & Station: normal Patient leans: N/A  Psychiatric Specialty Exam:  Presentation  General Appearance: Appropriate for Environment  Eye Contact:Fair  Speech:Normal Rate  Speech Volume:Normal  Handedness:Right   Mood and Affect  Mood:Depressed  Affect:Depressed   Thought Process  Thought Processes:Linear  Descriptions of Associations:Intact  Orientation:Full (Time, Place and Person)  Thought Content:Logical  History of Schizophrenia/Schizoaffective disorder:No  Duration of Psychotic Symptoms:No data recorded Hallucinations:Hallucinations: None  Ideas of Reference:None  Suicidal Thoughts:Suicidal Thoughts: No  Homicidal Thoughts:Homicidal Thoughts: No   Sensorium  Memory:Immediate Good; Recent Good  Judgment:Fair  Insight:Fair   Executive Functions  Concentration:Fair  Attention Span:Fair  [place]   Psychomotor Activity  Psychomotor Activity:Psychomotor Activity: Decreased   Assets  Assets:Communication Skills; Desire for Improvement   Sleep  Sleep:Sleep: Fair  Physical Exam: Physical Exam Review of Systems  Constitutional:  Negative for fever.  Respiratory:  Negative for cough.   Gastrointestinal:  Negative for nausea and vomiting.  Musculoskeletal:  Negative for myalgias.  Blood pressure 116/81, pulse 81, temperature 97.8 F (36.6 C), temperature source Oral, resp. rate 20, height 5' 1.5" (1.562 m), weight (!) 141.3 kg, last menstrual period 11/15/2021, SpO2 100 %. Body mass index is 57.92 kg/m.   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 female with MDD, PTSD, alcohol use disorder, and Cluster B personality disorder who is admitted for recurrent MDD with SI. [facility] Day 3    PLAN: Severe recurrent MDD episode with SI Improvement mood, with some lability, but reduced amplitude and reactivity -Oxcarbazepine 150mg  BID -Continue Pristiq 50mg  -Continue Melatonin 3mg    Alcohol abuse disorder Exacerbated by recurrent MDD for past two weeks. Will continue monitoring. -CIWA protocol without benzodiazepines.  -brief intervention and advising alcohol cessation.    Chronic Hypertension Pressures improved -continue Amlodipine 5mg    Contraception/reproductive health -started Depo-Provera  -Discussed need for repeat injection in 3 months with PCP   PTSD -Encouraging f/u with outpatient counselor/therapist at [facility] where she has recently been seen  -Meds per above   Cluster B Personality Disorder Recommend DBT or CBT with discharge   Dispo: TBD Will return home with Godmother and two children   Safety, monitoring and disposition planning: The patient was seen and evaluated on the unit.  The patient's chart was reviewed and nursing notes were reviewed.  The patient's case was discussed in multidisciplinary team meeting.  Plan and drug side effects were discussed with patient who was amendable. Social work and case management to assist with discharge planning and identification of hospital follow-up needs prior to discharge. Discharge Concerns: Need to establish a safety plan; Medication compliance and effectiveness Discharge Goals: Return  home with outpatient referrals for mental health follow-up including medication management/psychotherapy Safety and Monitoring: Involuntary status at inpatient psychiatric unit for safety, stabilization and treatment Daily contact with patient to assess and evaluate symptoms and progress in treatment and medical management Patient's case to be discussed in multi-disciplinary team meeting Observation Level: Per above Vital signs:  q12 hours Precautions: suicide   Maida Sale, MD 12/10/2021, 3:33 PM

## 2021-12-10 NOTE — Progress Notes (Addendum)
D: Patient states she slept poorly. Her energy level is low and she states her appetite is poor. She rates her depression as a 6; anxiety as a 0. Patient denies any hopelessness. Her goal today is to "express myself." She is pleasant with staff and did not express any concerns this morning. Patient is attending group activities and appears to be doing well with her treatment. Patient can be labile; however, her mood today is stable.  A: Continue to monitor medication management and MD orders.  Safety checks completed every 15 minutes per protocol.  Offer support and encouragement as needed.  R: Patient is receptive to staff; her behavior is appropriate.     12/10/21 0900  Psych Admission Type (Psych Patients Only)  Admission Status Voluntary  Psychosocial Assessment  Patient Complaints None  Eye Contact Brief  Facial Expression Anxious  Affect Appropriate to circumstance  Speech Logical/coherent  Interaction Minimal  Motor Activity Slow  Appearance/Hygiene Unremarkable  Behavior Characteristics Appropriate to situation  Mood Ambivalent  Thought Process  Coherency WDL  Content WDL  Delusions None reported or observed  Perception WDL  Hallucination None reported or observed  Judgment WDL  Confusion None  Danger to Self  Current suicidal ideation? Denies  Danger to Others  Danger to Others None reported or observed

## 2021-12-10 NOTE — Progress Notes (Signed)
Adult Psychoeducational Group Note  Date:  12/10/2021 Time:  9:33 AM  Group Topic/Focus:  Goals Group:   The focus of this group is to help patients establish daily goals to achieve during treatment and discuss how the patient can incorporate goal setting into their daily lives to aide in recovery.  Participation Level:  Active  Participation Quality:  Appropriate  Affect:  Appropriate  Cognitive:  Appropriate  Insight: Appropriate  Engagement in Group:  Engaged  Modes of Intervention:  Discussion  Additional Comments:  Pt stated her goal for the day is to be able to express herself to others .  Wynema Birch D 12/10/2021, 9:33 AM

## 2021-12-10 NOTE — Progress Notes (Signed)
[facility] Group Notes:  (Nursing/MHT/Case Management/Adjunct)  Date:  12/10/2021  Time:  2015  Type of Therapy:   wrap up group  Participation Level:  Active  Participation Quality:  Appropriate, Attentive, Sharing, and Supportive  Affect:  Appropriate  Cognitive:  Alert  Insight:  Improving  Engagement in Group:  Engaged  Modes of Intervention:  Clarification, Education, and Socialization  Summary of Progress/Problems: Positive thinking and self-care were discussed.   Marcille Buffy 12/10/2021, 9:03 PM

## 2021-12-10 NOTE — Group Note (Signed)
LCSW Group Therapy Note 12/10/2021  11:15am-12:00pm  Type of Therapy and Topic:  Group Therapy: Anger and Commonalities  Participation Level:  Active   Description of Group: In this group, patients initially shared an "unknown" fact about themselves and CSW led a discussion about the ways in which we have things in common without realizing it.  Patient then identified a recent time they became angry and how this yet again showed a way in which they had something in common with other patients.  We discussed possible unhealthy reactions to anger and possible healthy reactions.  We also discussed possible underlying emotions that lead to the anger.  Commonalities among group members were pointed out throughout the entirety of group.  Therapeutic Goals: Patients were asked to share something about themselves and learned that they often have things in common with other people without knowing this Patients will remember an incident of anger and how they reacted Patients will be able to identify their reaction as healthy or unhealthy, and identify possible reactions that would have been the opposite Patients will learn that anger itself is a secondary emotion and will think about their primary emotion at the time of their last incident of anger  Summary of Patient Progress:  The patient shared that something interesting she could share about herself is that she has a heart of gold.  This was expressed as also present in several other patients, so the patient was able to recognize that she is not alone.  The patient also said a frequent cause of anger is feeling unsupported throughout the hard life she has had and said her underlying feeling is abandonment.  There was a lengthy, healthy discussion in the group about not abandoning oneself.  The patient was tearful at times, but very receptive to the feedback provided.  Therapeutic Modalities:   Cognitive Behavioral Therapy  Lynnell Chad,  LCSW 12/10/2021  1:03 PM

## 2021-12-11 MED ORDER — MELATONIN 5 MG PO TABS
5.0000 mg | ORAL_TABLET | Freq: Every day | ORAL | Status: DC
  Administered 2021-12-11 – 2021-12-12 (×2): 5 mg via ORAL
  Filled 2021-12-11 (×5): qty 1

## 2021-12-11 MED ORDER — OXCARBAZEPINE 300 MG PO TABS
300.0000 mg | ORAL_TABLET | Freq: Two times a day (BID) | ORAL | Status: DC
  Administered 2021-12-11 – 2021-12-12 (×2): 300 mg via ORAL
  Filled 2021-12-11 (×6): qty 1

## 2021-12-11 NOTE — [facility] Group Notes (Signed)
Adult Psychoeducational Group Note  Date:  12/11/2021 Time:  3:45 PM  Group Topic/Focus:  Leisure Education  Patient attended leisure education group. She shared that her favorite leisure activity is "reading a book."   [employer] 12/11/2021, 3:45 PM

## 2021-12-11 NOTE — Progress Notes (Signed)
The patient rated her day as a 5 out of 10 since she did not accomplish her goal for the day. Her goal for today was to work on her goals.

## 2021-12-11 NOTE — Progress Notes (Signed)
°   12/11/21 2029  Psych Admission Type (Psych Patients Only)  Admission Status Voluntary  Psychosocial Assessment  Patient Complaints Sleep disturbance  Eye Contact Brief  Facial Expression Flat  Affect Appropriate to circumstance  Speech Logical/coherent  Interaction Minimal  Motor Activity Other (Comment) (WDL)  Appearance/Hygiene Unremarkable  Behavior Characteristics Appropriate to situation  Mood Depressed  Thought Process  Coherency WDL  Content WDL  Delusions None reported or observed  Perception WDL  Hallucination None reported or observed  Judgment Limited  Confusion None  Danger to Self  Current suicidal ideation? Denies  Danger to Others  Danger to Others None reported or observed

## 2021-12-11 NOTE — Progress Notes (Signed)
[facility] MD Progress Note  12/11/2021 11:26 AM Alexandra Gray  MRN:  [id number]  Alexandra Gray is a 31 y.o. female  PPHx of MDD and PTSD, who presented to [facility] for recurrent MDD with SI, now a(n) Voluntary  admit to [facility] for treatment of MDD with SI.  Patient case reviewed with team during progression rounds this morning. No further peer conflicts. She is attending groups. She is pleasant with staff and medication compliant.   Subjective:   Patient stated that she was really anxious and felt fidgety when thinking about her family. Stated coloring to occupy her mind has helped with her anxiety.  Stated that she did not sleep well at all, and endorsed multiple headaches at night, nightmares, and waking up gasping short of breath. Discussed with patient OSA and the need to see her PCP for sleep study referral for CPAP. Stated that her appetite is stable. Patient denied SI/HI/AVH, delusions, paranoia, first rank symptoms, and contracted to safety on the unit. Patient was not grossly responding to internal/external stimuli nor made any delusional statements during encounter.   Principal Problem: Severe episode of recurrent major depressive disorder, without psychotic features ([place]) Diagnosis: Principal Problem:   Severe episode of recurrent major depressive disorder, without psychotic features ([place]) Active Problems:   Cluster B personality disorder ([place])   PTSD (post-traumatic stress disorder)   Sleep apnea   MDD (major depressive disorder), recurrent severe, without psychosis ([place])  Total Time spent with patient: 20 minutes  Past Psychiatric History:  Previous Psych Diagnoses: MDD, PTSD Prior inpatient treatment: for MDD Current/prior outpatient treatment: None Prior rehab hx: None Psychotherapy hx: None History of suicide: One prior attempt in 2021 with Trazodone. History of homicide: None Psychiatric medication history: Depakote and Zoloft for a couple years until deciding  to stop in summer of 2022. Neuromodulation history: Unknown Current Psychiatrist: None Current therapist: None  Past Medical History:  Past Medical History:  Diagnosis Date   Chlamydia    Depression    doing ok now   Gestational diabetes    Pt states she does not have DMII   Gonorrhea    History of pregnancy induced hypertension 12/06/2016   Hx of trichomoniasis    Hypertension    sometimes is up, never on meds   Mental disorder    Ovarian cyst    PID (pelvic inflammatory disease)    Sleep apnea     Past Surgical History:  Procedure Laterality Date   CESAREAN SECTION N/A 10/03/2015   Procedure: CESAREAN SECTION;  Surgeon: Jonnie Kind, MD;  Location: [facility];  Service: Obstetrics;  Laterality: N/A;   CESAREAN SECTION N/A 01/04/2019   Procedure: CESAREAN SECTION;  Surgeon: Chancy Milroy, MD;  Location: [facility];  Service: Obstetrics;  Laterality: N/A;   CESAREAN SECTION N/A    Phreesia 03/08/2021   KNEE SURGERY     Family History:  Family History  Problem Relation Age of Onset   Healthy Mother    Healthy Father    Healthy Brother    Cancer Neg Hx    Diabetes Neg Hx    Heart disease Neg Hx    Hypertension Neg Hx    Stroke Neg Hx    Hearing loss Neg Hx    Family Psychiatric  History:  Psych: None Psych Rx: None or unknown. SA/HA: None Substance use family hx: Biological mother "used drugs, but don't know what." Social History:  Social History   Substance and  Sexual Activity  Alcohol Use Yes   Comment: 10 weekly     Social History   Substance and Sexual Activity  Drug Use Yes   Types: Marijuana   Comment: 1/8 weekly    Social History   Socioeconomic History   Marital status: Single    Spouse name: Not on file   Number of children: 2   Years of education: 9   Highest education level: Not on file  Occupational History   Occupation: disabled  Tobacco Use   Smoking status: Every Day    Types: Cigars   Smokeless tobacco: Never  Vaping  Use   Vaping Use: Never used  Substance and Sexual Activity   Alcohol use: Yes    Comment: 10 weekly   Drug use: Yes    Types: Marijuana    Comment: 1/8 weekly   Sexual activity: Not Currently    Birth control/protection: None  Other Topics Concern   Not on file  Social History Narrative   Lives alone in a one story home.  Has one child.     On disability for depression and PTSD.     Education: 9th grade.    She is in school for criminal justice.   Social Determinants of Health   Financial Resource Strain: Not on file  Food Insecurity: Not on file  Transportation Needs: Not on file  Physical Activity: Not on file  Stress: Not on file  Social Connections: Not on file   Additional Social History:    Marital status: Single Are you sexually active?: Yes What is your sexual orientation?: heterosexual Has your sexual activity been affected by drugs, alcohol, medication, or emotional stress?: none reported Does patient have children?: Yes How many children?: 2 How is patient's relationship with their children?: Patient states, "we are good, we are attached"       Sleep: Poor  Appetite:  Fair  Current Medications: Current Facility-Administered Medications  Medication Dose Route Frequency Provider Last Rate Last Admin   acetaminophen (TYLENOL) tablet 650 mg  650 mg Oral Q6H PRN Rankin, Shuvon B, NP   650 mg at 12/11/21 0735   alum & mag hydroxide-simeth (MAALOX/MYLANTA) 200-200-20 MG/5ML suspension 30 mL  30 mL Oral Q4H PRN Rankin, Shuvon B, NP       amLODipine (NORVASC) tablet 5 mg  5 mg Oral Daily Merrily Brittle, DO   5 mg at 12/11/21 0736   desvenlafaxine (PRISTIQ) 24 hr tablet 50 mg  50 mg Oral Daily Merrily Brittle, DO   50 mg at 12/11/21 [id number]   ibuprofen (ADVIL) tablet 400 mg  400 mg Oral Q6H PRN Bobbitt, Shalon E, NP       magnesium hydroxide (MILK OF MAGNESIA) suspension 30 mL  30 mL Oral Daily PRN Rankin, Shuvon B, NP       melatonin tablet 3 mg  3 mg Oral QHS Merrily Brittle, DO   3 mg at 12/10/21 2105   OXcarbazepine (TRILEPTAL) tablet 150 mg  150 mg Oral BID Merrily Brittle, DO   150 mg at 12/11/21 [id number]    Lab Results:  No results found for this or any previous visit (from the past 48 hour(s)).   Blood Alcohol level:  Lab Results  Component Value Date   [facility] <10 12/04/2021   ETH <10 [id number]    Metabolic Disorder Labs: Lab Results  Component Value Date   HGBA1C 5.3 12/08/2021   MPG 105.41 12/08/2021   MPG 105.41 11/03/2020   No  results found for: PROLACTIN Lab Results  Component Value Date   CHOL 159 12/08/2021   TRIG 177 (H) 12/08/2021   HDL 34 (L) 12/08/2021   CHOLHDL 4.7 12/08/2021   VLDL 35 12/08/2021   LDLCALC 90 12/08/2021   LDLCALC 116 (H) 06/29/2021    Physical Findings: AIMS:  , ,  ,  ,    CIWA:  CIWA-Ar Total: 2 COWS:     Musculoskeletal: Strength & Muscle Tone: within normal limits Gait & Station: normal Patient leans: N/A  Psychiatric Specialty Exam:  Presentation  General Appearance: Appropriate for Environment  Eye Contact:Fair  Speech:Normal Rate  Speech Volume:Normal  Handedness:Right   Mood and Affect  Mood:Depressed  Affect:Depressed   Thought Process  Thought Processes:Linear  Descriptions of Associations:Intact  Orientation:Full (Time, Place and Person)  Thought Content:Logical  History of Schizophrenia/Schizoaffective disorder:No  Duration of Psychotic Symptoms:No data recorded Hallucinations:Hallucinations: None  Ideas of Reference:None  Suicidal Thoughts:Suicidal Thoughts: No  Homicidal Thoughts:Homicidal Thoughts: No   Sensorium  Memory:Immediate Good; Recent Good  Judgment:Fair  Insight:Fair   Executive Functions  Concentration:Fair  Attention Span:Fair  [place]   Psychomotor Activity  Psychomotor Activity:Psychomotor Activity: Decreased   Assets  Assets:Communication Skills; Desire for  Improvement   Sleep  Sleep:Sleep: Fair    Physical Exam: Physical Exam Vitals and nursing note reviewed.  Constitutional:      General: She is not in acute distress.    Appearance: She is not ill-appearing or diaphoretic.  HENT:     Head: Normocephalic.  Pulmonary:     Effort: Pulmonary effort is normal.  Neurological:     General: No focal deficit present.     Mental Status: She is alert.   Review of Systems  Constitutional:  Negative for fever.  Respiratory:  Negative for cough and shortness of breath.   Cardiovascular:  Negative for chest pain.  Gastrointestinal:  Negative for nausea and vomiting.  Musculoskeletal:  Negative for myalgias.  Neurological:  Negative for dizziness and headaches.  Blood pressure (!) 144/111, pulse 77, temperature 97.6 F (36.4 C), temperature source Oral, resp. rate 18, height 5' 1.5" (1.562 m), weight (!) 141.3 kg, last menstrual period 11/15/2021, SpO2 100 %. Body mass index is 57.92 kg/m.   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 female with MDD, PTSD, alcohol use disorder, and Cluster B personality disorder who is admitted for recurrent MDD with SI. [facility] Day 3    PLAN: Severe recurrent MDD episode with SI Patient reported ongoing anxiety with racing thoughts and mood reactivity. Will increase mood stabilizer. -Increased Oxcarbazepine 150mg  BID to 300mg  BID -Continued Pristiq 50mg  -Increased Melatonin 3mg  to 5mg    Alcohol abuse disorder Exacerbated by recurrent MDD for past two weeks. Will  continue monitoring. -CIWA protocol without benzodiazepines.  -brief intervention and advising alcohol cessation.    Chronic Hypertension Pressures improved  -continued Amlodipine 5mg    Contraception/reproductive health Depo-Provera administered   -Discussed need for repeat injection in 3 months with PCP   PTSD -Encouraging f/u with outpatient counselor/therapist at [facility] where  she has recently been seen  -Meds per above   Cluster B Personality Disorder Recommend DBT or CBT with discharge  7.  OSA Patient reported symptoms of headaches, daytime somnolence, snoring, waking up gasping for air at night. -PCP follow-up with sleep study referral for CPAP   Dispo: Possibly Tuesday or Wednesday Will return home with Godmother and two children   Safety, monitoring and disposition planning: The patient was seen and evaluated on the unit.  The patient's chart was reviewed and nursing notes were reviewed.  The patient's case was discussed in multidisciplinary team meeting.  Plan and drug side effects were discussed with patient who was amendable. Social work and case management to assist with discharge planning and identification of hospital follow-up needs prior to discharge. Discharge Concerns: Need to establish a safety plan; Medication compliance and effectiveness Discharge Goals: Return home with outpatient referrals for mental health follow-up including medication management/psychotherapy Safety and Monitoring: Involuntary status at inpatient psychiatric unit for safety, stabilization and treatment Daily contact with patient to assess and evaluate symptoms and progress in treatment and medical management Patient's case to be discussed in multi-disciplinary team meeting Observation Level: Per above Vital signs:  q12 hours Precautions: suicide   Signed: Merrily Brittle, DO Psychiatry Resident, PGY-1 [facility] 12/11/2021, 12:02 PM

## 2021-12-11 NOTE — Progress Notes (Signed)
Nurse discussed coping skills with patient.  

## 2021-12-11 NOTE — [facility] Group Notes (Signed)
Adult Psychoeducational Group Note  Date:  12/11/2021 Time:  9:19 AM  Group Topic/Focus:  Orientation:   The focus of this group is to educate the patient on the purpose and policies of crisis stabilization and provide a format to answer questions about their admission.  The group details unit policies and expectations of patients while admitted.  Participation Level:  Active  Rosina Lowenstein 12/11/2021, 9:19 AM

## 2021-12-11 NOTE — Progress Notes (Signed)
D:  Patient's self inventory sheet, patient has poor sleep, no sleep medication.  Poor appetite, poor appetite, low energy level, poor concentration.  Rated depression 8, denied hopeless, rated anxiety 6.  Denied withdrawals.  Denied SI.  Physical problems.  Headaches.  Physical pain, head, medication is helpful.  Goal is to express myself.  Plans to talk to someone.  No discharge plans. A:  Medications administered per MD orders.  Emotional support and encouragement given patient. R:  Denied SI and HI, contracts for safety.  Denied a/V hallucinations.  Safety maintained with 15 minute checks.

## 2021-12-11 NOTE — Group Note (Signed)
[place] LCSW Group Therapy Note  12/11/2021  10:00-11:00AM  Type of Therapy and Topic:  Group Therapy:  Adding Supports Including Being Your Own Support  Participation Level:  Active   Description of Group:  Patients in this group were introduced to the concept that additional supports including self-support are an essential part of recovery.  After a discussion about the differences between healthy supports and unheathy supports, a song entitled "My Own Hero" was played.  A group discussion ensued in which patients stated they could relate to the song and it inspired them to realize they have be willing to help themselves in order to succeed, because other people cannot achieve sobriety or stability for them.  We discussed adding a variety of healthy supports to address the various needs in our lives.  We also talked about how to put up necessary boundaries to limit unhealthy supports from harming [place].  Therapeutic Goals: 1)  demonstrate the importance of being a part of one's own support system 2)  discuss reasons people in one's life may eventually be unable to be continually supportive  3)  identify the patient's current support system and   4)  elicit commitments to add healthy supports and to become more conscious of being self-supportive   Summary of Patient Progress:  The patient expressed her healthy support(s) right now include her kids while unhealthy supports include some family members.  The patient's overall reaction to this topic was silence, although she did listen attentively.  Therapeutic Modalities:   Motivational Interviewing Activity  Alexandra Gray

## 2021-12-12 ENCOUNTER — Encounter (HOSPITAL_COMMUNITY): Payer: Self-pay | Admitting: Registered Nurse

## 2021-12-12 ENCOUNTER — Encounter (HOSPITAL_COMMUNITY): Payer: Self-pay

## 2021-12-12 MED ORDER — OXCARBAZEPINE 150 MG PO TABS
150.0000 mg | ORAL_TABLET | Freq: Two times a day (BID) | ORAL | Status: DC
  Administered 2021-12-12 – 2021-12-16 (×8): 150 mg via ORAL
  Filled 2021-12-12 (×12): qty 1

## 2021-12-12 MED ORDER — PRAZOSIN HCL 1 MG PO CAPS
1.0000 mg | ORAL_CAPSULE | Freq: Every day | ORAL | Status: DC
  Administered 2021-12-12: 1 mg via ORAL
  Filled 2021-12-12 (×4): qty 1
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
## 2021-12-12 NOTE — [facility] Group Notes (Signed)
PT attended AA group.  °

## 2021-12-12 NOTE — Progress Notes (Signed)
°  D: Patient is alert and oriented x 4. Patient denies SI/HI/ AVH but endorses headache pain. Disposition is Calm and cooperative with appropriate affect. Verbally contracts for safety to this Clinical research associate.   A:  Pt was given scheduled medications. Pt was encourage to attend groups. Q 15 minute checks were done for safety. Pt was visualized interacting appropriately with others in the milieu. Pt was offered support and encouragement by this Clinical research associate. Pt is goal oriented and stated goal was reached for the day. Pt attended groups and interacts well with peers and staff. Pt is taking medication. Pt complied with scheduled medications. No signs of distress nor concerns reported by patient at present.Pt remains receptive to treatment and safety maintained on unit.    R: Will continue to monitor and assess. PRN given for headache. Safety maintained during this shift.       12/12/21 2200  Psych Admission Type (Psych Patients Only)  Admission Status Voluntary  Psychosocial Assessment  Patient Complaints None  Eye Contact Brief  Facial Expression Flat  Affect Euphoric  Speech Logical/coherent  Interaction Attention-seeking  Motor Activity Slow  Appearance/Hygiene Unremarkable  Behavior Characteristics Appropriate to situation;Cooperative  Thought Process  Coherency WDL  Content WDL  Delusions None reported or observed  Perception WDL  Hallucination None reported or observed  Judgment Limited  Confusion None  Danger to Self  Current suicidal ideation? Denies  Danger to Others  Danger to Others None reported or observed

## 2021-12-12 NOTE — Group Note (Signed)
Recreation Therapy Group Note   Group Topic:Stress Management  Group Date: 12/12/2021 Start Time: 0930 End Time: 0950 Facilitators: Caroll Rancher, [place] Location: [address] Dayroom   Goal Area(s) Addresses:  Patient will identify positive stress management techniques. Patient will identify benefits of using stress management post d/c.  Group Description:  Meditation.  LRT played a meditation that focused on effortlessness.  The meditation was encouraging patients to take the time do nothing and embrace just being.  Patients were to listen and follow along as meditation played to fully engage in activity.   Affect/Mood: Appropriate   Participation Level: Active   Participation Quality: Independent   Behavior: Appropriate   Speech/Thought Process: Focused   Insight: Good   Judgement: Good   Modes of Intervention: Meditation   Patient Response to Interventions:  Engaged   Education Outcome:  Acknowledges education and In group clarification offered    Clinical Observations/Individualized Feedback: Pt attended and participated in group.     Plan: Continue to engage patient in RT group sessions 2-3x/week.   Caroll Rancher, LRT,CTRS 12/12/2021 11:15 AM

## 2021-12-12 NOTE — BH IP Treatment Plan (Signed)
Interdisciplinary Treatment and Diagnostic Plan Update  12/12/2021 Time of Session: 9:00am Alexandra Gray MRN: [id number]  Principal Diagnosis: Severe episode of recurrent major depressive disorder, without psychotic features (HCC)  Secondary Diagnoses: Principal Problem:   Severe episode of recurrent major depressive disorder, without psychotic features (HCC) Active Problems:   Cluster B personality disorder (HCC)   PTSD (post-traumatic stress disorder)   Sleep apnea   MDD (major depressive disorder), recurrent severe, without psychosis (HCC)   Current Medications:  Current Facility-Administered Medications  Medication Dose Route Frequency Provider Last Rate Last Admin   acetaminophen (TYLENOL) tablet 650 mg  650 mg Oral Q6H PRN Rankin, Shuvon B, NP   650 mg at 12/12/21 0731   alum & mag hydroxide-simeth (MAALOX/MYLANTA) 200-200-20 MG/5ML suspension 30 mL  30 mL Oral Q4H PRN Rankin, Shuvon B, NP       amLODipine (NORVASC) tablet 5 mg  5 mg Oral Daily Princess Bruins, DO   5 mg at 12/12/21 0731   desvenlafaxine (PRISTIQ) 24 hr tablet 50 mg  50 mg Oral Daily Princess Bruins, DO   50 mg at 12/12/21 0731   ibuprofen (ADVIL) tablet 400 mg  400 mg Oral Q6H PRN Bobbitt, Shalon E, NP       magnesium hydroxide (MILK OF MAGNESIA) suspension 30 mL  30 mL Oral Daily PRN Rankin, Shuvon B, NP       melatonin tablet 5 mg  5 mg Oral QHS Princess Bruins, DO   5 mg at 12/11/21 2106   Oxcarbazepine (TRILEPTAL) tablet 300 mg  300 mg Oral BID Princess Bruins, DO   300 mg at 12/12/21 3220   PTA Medications: Medications Prior to Admission  Medication Sig Dispense Refill Last Dose   acetaminophen (TYLENOL) 500 MG tablet Take 1,000 mg by mouth every 6 (six) hours as needed for moderate pain or headache.      albuterol (VENTOLIN HFA) 108 (90 Base) MCG/ACT inhaler Inhale 2 puffs into the lungs every 6 (six) hours as needed for wheezing or shortness of breath. Please instruct in technique and dispense with spacer 8  g 0    benzonatate (TESSALON) 100 MG capsule Take 1 capsule (100 mg total) by mouth 2 (two) times daily as needed for cough. 20 capsule 0    divalproex (DEPAKOTE) 500 MG DR tablet Take 1 tablet (500 mg total) by mouth at bedtime as needed (sleep). (Patient not taking: Reported on 12/05/2021)      melatonin 3 MG TABS tablet Take 1 tablet (3 mg total) by mouth at bedtime. (Patient not taking: Reported on 12/05/2021) 30 tablet 0    ondansetron (ZOFRAN) 4 MG tablet Take 1 tablet (4 mg total) by mouth every 8 (eight) hours as needed for nausea or vomiting. 20 tablet 0    predniSONE (STERAPRED UNI-PAK 21 TAB) 10 MG (21) TBPK tablet Take by mouth daily. Take 6 tabs by mouth daily  for 2 days, then 5 tabs for 2 days, then 4 tabs for 2 days, then 3 tabs for 2 days, 2 tabs for 2 days, then 1 tab by mouth daily for 2 days (Patient not taking: Reported on 12/05/2021) 42 tablet 0    sertraline (ZOLOFT) 100 MG tablet Take 1 tablet (100 mg total) by mouth daily. (Patient not taking: Reported on 12/05/2021) 30 tablet 0     Patient Stressors: Loss of family member's friends   Medication change or noncompliance   Substance abuse    Patient Strengths: Ability for insight  Average  or above average intelligence  Capable of independent living  Communication skills  General fund of knowledge  Motivation for treatment/growth  Supportive family/friends   Treatment Modalities: Medication Management, Group therapy, Case management,  1 to 1 session with clinician, Psychoeducation, Recreational therapy.   Physician Treatment Plan for Primary Diagnosis: Severe episode of recurrent major depressive disorder, without psychotic features (HCC) Long Term Goal(s): Improvement in symptoms so as ready for discharge   Short Term Goals: Ability to identify changes in lifestyle to reduce recurrence of condition will improve Ability to verbalize feelings will improve Ability to disclose and discuss suicidal ideas Ability to identify  and develop effective coping behaviors will improve Ability to maintain clinical measurements within normal limits will improve Compliance with prescribed medications will improve Ability to identify triggers associated with substance abuse/mental health issues will improve  Medication Management: Evaluate patient's response, side effects, and tolerance of medication regimen.  Therapeutic Interventions: 1 to 1 sessions, Unit Group sessions and Medication administration.  Evaluation of Outcomes: Progressing  Physician Treatment Plan for Secondary Diagnosis: Principal Problem:   Severe episode of recurrent major depressive disorder, without psychotic features (HCC) Active Problems:   Cluster B personality disorder (HCC)   PTSD (post-traumatic stress disorder)   Sleep apnea   MDD (major depressive disorder), recurrent severe, without psychosis (HCC)  Long Term Goal(s): Improvement in symptoms so as ready for discharge   Short Term Goals: Ability to identify changes in lifestyle to reduce recurrence of condition will improve Ability to verbalize feelings will improve Ability to disclose and discuss suicidal ideas Ability to identify and develop effective coping behaviors will improve Ability to maintain clinical measurements within normal limits will improve Compliance with prescribed medications will improve Ability to identify triggers associated with substance abuse/mental health issues will improve     Medication Management: Evaluate patient's response, side effects, and tolerance of medication regimen.  Therapeutic Interventions: 1 to 1 sessions, Unit Group sessions and Medication administration.  Evaluation of Outcomes: Progressing   RN Treatment Plan for Primary Diagnosis: Severe episode of recurrent major depressive disorder, without psychotic features (HCC) Long Term Goal(s): Knowledge of disease and therapeutic regimen to maintain health will improve  Short Term Goals:  Ability to remain free from injury will improve, Ability to verbalize frustration and anger appropriately will improve, Ability to demonstrate self-control, Ability to identify and develop effective coping behaviors will improve, and Compliance with prescribed medications will improve  Medication Management: RN will administer medications as ordered by provider, will assess and evaluate patient's response and provide education to patient for prescribed medication. RN will report any adverse and/or side effects to prescribing provider.  Therapeutic Interventions: 1 on 1 counseling sessions, Psychoeducation, Medication administration, Evaluate responses to treatment, Monitor vital signs and CBGs as ordered, Perform/monitor CIWA, COWS, AIMS and Fall Risk screenings as ordered, Perform wound care treatments as ordered.  Evaluation of Outcomes: Progressing   LCSW Treatment Plan for Primary Diagnosis: Severe episode of recurrent major depressive disorder, without psychotic features (HCC) Long Term Goal(s): Safe transition to appropriate next level of care at discharge, Engage patient in therapeutic group addressing interpersonal concerns.  Short Term Goals: Engage patient in aftercare planning with referrals and resources, Increase social support, Increase ability to appropriately verbalize feelings, Identify triggers associated with mental health/substance abuse issues, and Increase skills for wellness and recovery  Therapeutic Interventions: Assess for all discharge needs, 1 to 1 time with Social worker, Explore available resources and support systems, Assess for adequacy in community  support network, Educate family and significant other(s) on suicide prevention, Complete Psychosocial Assessment, Interpersonal group therapy.  Evaluation of Outcomes: Progressing   Progress in Treatment: Attending groups: Yes. Participating in groups: Yes. Taking medication as prescribed: Yes. Toleration medication:  Yes. Family/Significant other contact made: Yes, individual(s) contacted:  If consents are provided  Patient understands diagnosis: Yes. Discussing patient identified problems/goals with staff: Yes. Medical problems stabilized or resolved: Yes. Denies suicidal/homicidal ideation: Yes. Issues/concerns per patient self-inventory: No.     New problem(s) identified: No, Describe:  None    New Short Term/Long Term Goal(s): medication stabilization, elimination of SI thoughts, development of comprehensive mental wellness plan.    Patient Goals: "To work on my depression and anger"    Discharge Plan or Barriers: Patient is to return home and is to follow up with established providers. Pt is also to attend support groups.    Reason for Continuation of Hospitalization: Depression Medication stabilization    Estimated Length of Stay: 1- 3 days   Scribe for Treatment Team: Otelia SanteeMeredith A Laiya Wisby, LCSW 12/12/2021 9:38 AM

## 2021-12-12 NOTE — [facility] Suicide Risk Assessment (Addendum)
[facility] INPATIENT:  Family/Significant Other Suicide Prevention Education  Suicide Prevention Education:  Contact Attempts: Janace Litten, Adoptive mother, [phone], (name of family member/significant other) has been identified by the patient as the family member/significant other with whom the patient will be residing, and identified as the person(s) who will aid the patient in the event of a mental health crisis.  With written consent from the patient, two attempts were made to provide suicide prevention education, prior to and/or following the patient's discharge.  We were unsuccessful in providing suicide prevention education.  A suicide education pamphlet was given to the patient to share with family/significant other.  Date and time of first attempt:12/12/2021  / 9:43am  CSW left a HIPAA compliant message.  Date and time of second attempt: 12/13/2021  / 9:44am  CSW left a HIPAA compliant message.   Felizardo Hoffmann 12/12/2021, 9:44 AM

## 2021-12-12 NOTE — Progress Notes (Signed)
Patient ID: Alexandra Gray, female   DOB: 1991-01-07, 31 y.o.   MRN: [id number] Patient came to nursing staff and states, "should my medication make me feel like this?" I asked her what symptoms she is experiencing and she states, "I feel high." Informed her I would let her provider know.

## 2021-12-12 NOTE — Progress Notes (Addendum)
D: Patient denies any self-harm thoughts today. Patient approached this nurse and asked if her medication should make her feel "high." She was informed that provider would be notified of same. She denies any depressive symptoms; however, her affect remains flat and blunted. She is interacting well with her peers. She is visible in the milieu.   A: Continue to monitor medication management and MD orders.  Safety checks completed every 15 minutes per protocol.  Offer support and encouragement as needed.  R: Patient is receptive to staff; her behavior is appropriate.     12/12/21 0800  Psych Admission Type (Psych Patients Only)  Admission Status Voluntary  Psychosocial Assessment  Patient Complaints Other (Comment) (states she feels "high")  Eye Contact Brief  Facial Expression Flat  Affect Euphoric  Speech Logical/coherent  Interaction Attention-seeking  Motor Activity Slow  Appearance/Hygiene Unremarkable  Behavior Characteristics Cooperative  Mood Elated  Thought Process  Coherency WDL  Content WDL  Delusions None reported or observed  Perception WDL  Hallucination None reported or observed  Judgment Limited  Confusion None  Danger to Self  Current suicidal ideation? Denies  Danger to Others  Danger to Others None reported or observed

## 2021-12-12 NOTE — Group Note (Signed)
LCSW Group Therapy Note   Group Date: 12/12/2021 Start Time: 1300 End Time: 1400  Type of Therapy and Topic:  Group Therapy:  Self-Esteem   Participation Level:  Minimal  Description of Group: This group addressed positive self-esteem. Patients were given a worksheet with a blank shield. Patients were asked what a shield is and when it is used. Patients were asked to list, draw, or write protective factors in the their lives on their shields. Patients discussed the words, ideas and drawings that they put on their shield. Patients were encouraged to have a daily reflection of positive characteristics/ protective factors.  Therapeutic Goals Patient will verbalize two of their positive qualities Patient will demonstrate insight but naming social supports in their lives Patient will verbalize their feelings when voicing positive self affirmations and when voicing positive affirmations of others Patients will discuss the potential positive impact on their wellness/recovery of focusing on positive traits of self and others.  Summary of Patient Progress: Pt shared during introductions that one things that makes her smile is her children. Pt completed her worksheet but did not participate in discussion.  Felizardo Hoffmann, LCSWA 12/12/2021  1:57 PM

## 2021-12-13 LAB — BASIC METABOLIC PANEL
Anion gap: 8 (ref 5–15)
BUN: 16 mg/dL (ref 6–20)
CO2: 26 mmol/L (ref 22–32)
Calcium: 9.3 mg/dL (ref 8.9–10.3)
Chloride: 102 mmol/L (ref 98–111)
Creatinine, Ser: 0.69 mg/dL (ref 0.44–1.00)
GFR, Estimated: >60 mL/min (ref >60)
Glucose, Bld: 107 mg/dL — ABNORMAL HIGH (ref 70–99)
Potassium: 4 mmol/L (ref 3.5–5.1)
Sodium: 136 mmol/L (ref 135–145)

## 2021-12-13 LAB — CBC WITH DIFFERENTIAL/PLATELET
Abs Immature Granulocytes: 0.06 10*3/uL (ref 0.00–0.07)
Basophils Absolute: 0 10*3/uL (ref 0.0–0.1)
Basophils Relative: 0 %
Eosinophils Absolute: 0.2 10*3/uL (ref 0.0–0.5)
Eosinophils Relative: 2 %
HCT: 43.2 % (ref 36.0–46.0)
Hemoglobin: 13.8 g/dL (ref 12.0–15.0)
Immature Granulocytes: 1 %
Lymphocytes Relative: 32 %
Lymphs Abs: 3 10*3/uL (ref 0.7–4.0)
MCH: 26.4 pg (ref 26.0–34.0)
MCHC: 31.9 g/dL (ref 30.0–36.0)
MCV: 82.6 fL (ref 80.0–100.0)
Monocytes Absolute: 0.5 10*3/uL (ref 0.1–1.0)
Monocytes Relative: 5 %
Neutro Abs: 5.7 10*3/uL (ref 1.7–7.7)
Neutrophils Relative %: 60 %
Platelets: 308 10*3/uL (ref 150–400)
RBC: 5.23 MIL/uL — ABNORMAL HIGH (ref 3.87–5.11)
RDW: 14.4 % (ref 11.5–15.5)
WBC: 9.5 10*3/uL (ref 4.0–10.5)
nRBC: 0 % (ref 0.0–0.2)

## 2021-12-13 MED ORDER — POLYETHYLENE GLYCOL 3350 17 G PO PACK
17.0000 g | PACK | Freq: Every day | ORAL | Status: DC
  Administered 2021-12-13 – 2021-12-15 (×3): 17 g via ORAL
  Filled 2021-12-13 (×7): qty 1

## 2021-12-13 MED ORDER — BUSPIRONE HCL 5 MG PO TABS
5.0000 mg | ORAL_TABLET | Freq: Three times a day (TID) | ORAL | Status: DC
  Administered 2021-12-14 – 2021-12-16 (×7): 5 mg via ORAL
  Filled 2021-12-13 (×14): qty 1

## 2021-12-13 MED ORDER — PRAZOSIN HCL 2 MG PO CAPS
2.0000 mg | ORAL_CAPSULE | Freq: Every day | ORAL | Status: DC
  Administered 2021-12-13: 2 mg via ORAL
  Filled 2021-12-13 (×2): qty 1

## 2021-12-13 MED ORDER — LOPERAMIDE HCL 2 MG PO CAPS: 2.0000 mg | ORAL_CAPSULE | ORAL | Status: DC | PRN

## 2021-12-13 MED ORDER — QUETIAPINE FUMARATE ER 50 MG PO TB24
50.0000 mg | ORAL_TABLET | Freq: Every day | ORAL | Status: DC
  Administered 2021-12-13: 50 mg via ORAL
  Filled 2021-12-13 (×2): qty 1

## 2021-12-13 MED ORDER — BUSPIRONE HCL 7.5 MG PO TABS
7.5000 mg | ORAL_TABLET | Freq: Three times a day (TID) | ORAL | Status: DC
  Administered 2021-12-13: 7.5 mg via ORAL
  Filled 2021-12-13 (×5): qty 1

## 2021-12-13 MED ORDER — PNEUMOCOCCAL VAC POLYVALENT 25 MCG/0.5ML IJ INJ
0.5000 mL | INJECTION | INTRAMUSCULAR | Status: AC
  Administered 2021-12-14: 0.5 mL via INTRAMUSCULAR
  Filled 2021-12-13: qty 0.5

## 2021-12-13 MED ORDER — ONDANSETRON 4 MG PO TBDP: 4.0000 mg | ORAL_TABLET | Freq: Four times a day (QID) | ORAL | Status: DC | PRN

## 2021-12-13 MED ORDER — LORAZEPAM 1 MG PO TABS: 1.0000 mg | ORAL_TABLET | Freq: Four times a day (QID) | ORAL | Status: DC | PRN

## 2021-12-13 NOTE — Progress Notes (Signed)
°   12/13/21 1955  Psych Admission Type (Psych Patients Only)  Admission Status Voluntary  Psychosocial Assessment  Patient Complaints Anxiety  Eye Contact Fair  Facial Expression Animated;Anxious  Affect Anxious;Appropriate to circumstance  Speech Logical/coherent  Interaction Assertive  Motor Activity Slow  Appearance/Hygiene Unremarkable  Behavior Characteristics Cooperative;Appropriate to situation  Mood Pleasant  Thought Process  Coherency WDL  Content WDL  Delusions None reported or observed  Perception WDL  Hallucination None reported or observed  Judgment WDL  Confusion None  Danger to Self  Current suicidal ideation? Denies  Danger to Others  Danger to Others None reported or observed   Pt seen in her room reading. Pt denies SI, HI, AVH. Pt endorses pain 10/10 as a headache. Pt says she has had a lot of headaches lately. Pt rates anxiety 6/10 and denies depression. Pt reported constipation to her nurse on day shift. Pt given laxative. Has not had a bowel movement yet today. Pt scores 4 on CIWA d/t anxiety and headache. Pt BP wnl this evening. Pt attended group.

## 2021-12-13 NOTE — Plan of Care (Signed)
?  Problem: Clinical Measurements: ?Goal: Ability to maintain clinical measurements within normal limits will improve ?Outcome: Progressing ?Goal: Will remain free from infection ?Outcome: Progressing ?Goal: Diagnostic test results will improve ?Outcome: Progressing ?  ?

## 2021-12-13 NOTE — Group Note (Signed)
Recreation Therapy Group Note   Group Topic:Animal Assisted Therapy   Group Date: 12/13/2021 Start Time: 1430 End Time: 1515 Facilitators: Caroll Rancher, LRT,CTRS Location: [address] Dayroom   Animal-Assisted Activity (AAA) Program Checklist/Progress Note Patient Eligibility Criteria Checklist & Daily Group note for Rec Tx Intervention   AAA/T Program Assumption of Risk Form signed by Patient/ or Parent Legal Guardian YES  Patient is free of allergies or severe asthma  YES  Patient reports no fear of animals YES  Patient reports no history of cruelty to animals YES  Patient understands their participation is voluntary YES  Patient washes hands before animal contact YES  Patient washes hands after animal contact YES   Group Description: Patients provided opportunity to interact with trained and credentialed [employer] Therapy dog and the community volunteer/dog handler. Patients practiced appropriate animal interaction and were educated on dog safety outside of the hospital in common community settings. Patients were allowed to use dog toys and other items to practice commands, engage the dog in play, and/or complete routine aspects of animal care.   Education: Charity fundraiser, Health visitor, Communication & Social Skills    Affect/Mood: Appropriate   Participation Level: Engaged   Education Outcome:  Acknowledges education and In group clarification offered    Clinical Observations/Individualized Feedback:  Pt attended and participated in group.  Pt was appropriate engaging with therapy dog team and sharing stories about personal pets at home.   Plan: Continue to engage patient in RT group sessions 2-3x/week.   Caroll Rancher, Antonietta Jewel 12/13/2021 3:37 PM

## 2021-12-13 NOTE — Progress Notes (Addendum)
[facility] MD Progress Note  12/13/2021 3:49 PM Alexandra Gray  MRN: [id number]  CC: depression   Subjective: Alexandra Gray is a 31 y.o. female with PPHx of MDD, PTSD, Alcohol use disorder and Cluster B traits who was admitted to [facility] for management and treatment of MDD with SI.  [facility] Day 7   Overnight Events: No acute events overnight or behavioral issues noted in chart. Patient was compliant with scheduled meds, no agitation PRN's required, and attended group therapies appropriately. CIWA-Ar Total: 1  PRNs: Tylenol at 7am No sleep data recorded last night.   Interim History: Patient was evaluated today with attending Dr. Mason Jim. Patient was initially seen in group room, and was pleasant and cooperative with evaluation.  Patient reported feeling "agitated." She notes that she spoke with her family today who think that she is "crazy and wants attention." Pt notes her family is not supportive of her and she would likely want to spend time at a supportive friend's house after discharge rather than returning to live with parents. She reports ongoing issues with ease to feel irritable and with anxiety. Pt notes that she continues to have flashbacks and nightmares related to her PTSD, in addition to sudden awakenings. She reported sleeping about 1 hour overnight. Notes her anxiety is 7/10 and depression is 5/10 today, compared to depression and anxiety being 10/10 on admission. She reported that her appetite is stable. Patient denied medication side effects and is tolerating it well. Patient denied SI/HI/AVH, delusions, paranoia, ideas of reference, or first rank symptoms, and contracted for safety on the unit. She reports some mild constipation but denies other physical complaints.  Diagnosis: Principal Problem:   Severe episode of recurrent major depressive disorder, without psychotic features (HCC) Active Problems:   PTSD (post-traumatic stress disorder)   Cluster B  personality disorder (HCC)   Sleep apnea   Benign essential HTN   Total Time spent with patient:  I personally spent 30 minutes on the unit in direct patient care. The direct patient care time included face-to-face time with the patient, reviewing the patient's chart, communicating with other professionals, and coordinating care. Greater than 50% of this time was spent in counseling or coordinating care with the patient regarding goals of hospitalization, psycho-education, and discharge planning needs.   Past Psychiatric History: See H&P  Past Medical History:  Past Medical History:  Diagnosis Date   Cesarean delivery delivered 01/04/2019   Chlamydia    Chronic hypertension during pregnancy, antepartum 07/30/2018   BP elevated 12/21, 01/29/18, 05/11/18, 07/29/18  Arly.Keller ] Aspirin 81 mg daily after 12 weeks Current antihypertensives:  None   Baseline and surveillance labs (pulled in from [facility], refresh links as needed)  Lab Results  Component Value Date   PLT 218 07/29/2018   CREATININE 0.59 07/29/2018   AST 16 07/29/2018   ALT 15 07/29/2018              Antenatal Testing CHTN - O10.919  Group I  BP < 140/90, no preecl   Depression    doing ok now   Gestational diabetes    Pt states she does not have DMII   Gonorrhea    H/O: C-section 01/04/2019   H/O: C-section 01/04/2019   History of cesarean delivery affecting pregnancy 10/03/2015   x1 in 2016, currently plans RLTCS  Declines TOLAC. Form signed 09/20/2018    History of cesarean delivery affecting pregnancy 10/03/2015   x1 in 2016, currently plans RLTCS  Declines TOLAC. Form signed 09/20/2018    History of cesarean delivery affecting pregnancy 10/03/2015   x1 in 2016, currently plans RLTCS  Declines TOLAC. Form signed 09/20/2018    History of pregnancy induced hypertension 12/06/2016   Hx of trichomoniasis    Hypertension    sometimes is up, never on meds   Mental disorder    Ovarian cyst    PID (pelvic inflammatory disease)    Sleep apnea     Past  Surgical History:  Procedure Laterality Date   CESAREAN SECTION N/A 10/03/2015   Procedure: CESAREAN SECTION;  Surgeon: Jonnie Kind, MD;  Location: [facility];  Service: Obstetrics;  Laterality: N/A;   CESAREAN SECTION N/A 01/04/2019   Procedure: CESAREAN SECTION;  Surgeon: Chancy Milroy, MD;  Location: [facility];  Service: Obstetrics;  Laterality: N/A;   CESAREAN SECTION N/A    Phreesia 03/08/2021   KNEE SURGERY     Family History:  Family History  Problem Relation Age of Onset   Healthy Mother    Healthy Father    Healthy Brother    Cancer Neg Hx    Diabetes Neg Hx    Heart disease Neg Hx    Hypertension Neg Hx    Stroke Neg Hx    Hearing loss Neg Hx    Family Psychiatric  History: See H&P  Social History:  Social History   Substance and Sexual Activity  Alcohol Use Yes   Comment: 10 weekly     Social History   Substance and Sexual Activity  Drug Use Yes   Types: Marijuana   Comment: 1/8 weekly    Social History   Socioeconomic History   Marital status: Single    Spouse name: Not on file   Number of children: 2   Years of education: 9   Highest education level: Not on file  Occupational History   Occupation: disabled  Tobacco Use   Smoking status: Every Day    Types: Cigars   Smokeless tobacco: Never  Vaping Use   Vaping Use: Never used  Substance and Sexual Activity   Alcohol use: Yes    Comment: 10 weekly   Drug use: Yes    Types: Marijuana    Comment: 1/8 weekly   Sexual activity: Not Currently    Birth control/protection: None  Other Topics Concern   Not on file  Social History Narrative   Lives alone in a one story home.  Has one child.     On disability for depression and PTSD.     Education: 9th grade.    She is in school for criminal justice.   Social Determinants of Health   Financial Resource Strain: Not on file  Food Insecurity: Not on file  Transportation Needs: Not on file  Physical Activity: Not on file  Stress: Not  on file  Social Connections: Not on file   Appetite: Fair  Current Medications: Current Facility-Administered Medications  Medication Dose Route Frequency Provider Last Rate Last Admin   acetaminophen (TYLENOL) tablet 650 mg  650 mg Oral Q6H PRN Rankin, Shuvon B, NP   650 mg at 12/13/21 0741   alum & mag hydroxide-simeth (MAALOX/MYLANTA) 200-200-20 MG/5ML suspension 30 mL  30 mL Oral Q4H PRN Rankin, Shuvon B, NP       amLODipine (NORVASC) tablet 5 mg  5 mg Oral Daily Merrily Brittle, DO   5 mg at 12/13/21 0741   busPIRone (BUSPAR) tablet 7.5 mg  7.5 mg  Oral TID Princess Bruins, DO       desvenlafaxine (PRISTIQ) 24 hr tablet 50 mg  50 mg Oral Daily Princess Bruins, DO   50 mg at 12/13/21 0741   ibuprofen (ADVIL) tablet 400 mg  400 mg Oral Q6H PRN Bobbitt, Shalon E, NP       magnesium hydroxide (MILK OF MAGNESIA) suspension 30 mL  30 mL Oral Daily PRN Rankin, Shuvon B, NP       melatonin tablet 5 mg  5 mg Oral QHS Princess Bruins, DO   5 mg at 12/12/21 2116   OXcarbazepine (TRILEPTAL) tablet 150 mg  150 mg Oral BID Princess Bruins, DO   150 mg at 12/13/21 0740   polyethylene glycol (MIRALAX / GLYCOLAX) packet 17 g  17 g Oral Daily Princess Bruins, DO       prazosin (MINIPRESS) capsule 2 mg  2 mg Oral QHS Princess Bruins, DO       QUEtiapine (SEROQUEL XR) 24 hr tablet 50 mg  50 mg Oral QHS Princess Bruins, DO        Lab Results:  Results for orders placed or performed during the hospital encounter of 12/06/21 (from the past 48 hour(s))  CBC with Differential/Platelet     Status: Abnormal   Collection Time: 12/13/21  6:28 AM  Result Value Ref Range   WBC 9.5 4.0 - 10.5 K/uL   RBC 5.23 (H) 3.87 - 5.11 MIL/uL   Hemoglobin 13.8 12.0 - 15.0 g/dL   HCT 95.3 20.2 - 33.4 %   MCV 82.6 80.0 - 100.0 fL   MCH 26.4 26.0 - 34.0 pg   MCHC 31.9 30.0 - 36.0 g/dL   RDW 35.6 86.1 - 68.3 %   Platelets 308 150 - 400 K/uL   nRBC 0.0 0.0 - 0.2 %   Neutrophils Relative % 60 %   Neutro Abs 5.7 1.7 - 7.7 K/uL    Lymphocytes Relative 32 %   Lymphs Abs 3.0 0.7 - 4.0 K/uL   Monocytes Relative 5 %   Monocytes Absolute 0.5 0.1 - 1.0 K/uL   Eosinophils Relative 2 %   Eosinophils Absolute 0.2 0.0 - 0.5 K/uL   Basophils Relative 0 %   Basophils Absolute 0.0 0.0 - 0.1 K/uL   Immature Granulocytes 1 %   Abs Immature Granulocytes 0.06 0.00 - 0.07 K/uL    Comment: Performed at [facility] [address], [place], [place] [address]  Basic metabolic panel     Status: Abnormal   Collection Time: 12/13/21  6:28 AM  Result Value Ref Range   Sodium 136 135 - 145 mmol/L   Potassium 4.0 3.5 - 5.1 mmol/L   Chloride 102 98 - 111 mmol/L   CO2 26 22 - 32 mmol/L   Glucose, Bld 107 (H) 70 - 99 mg/dL    Comment: Glucose reference range applies only to samples taken after fasting for at least 8 hours.   BUN 16 6 - 20 mg/dL   Creatinine, Ser 1.11 0.44 - 1.00 mg/dL   Calcium 9.3 8.9 - 55.2 mg/dL   GFR, Estimated >08 >02 mL/min    Comment: (NOTE) Calculated using the CKD-EPI Creatinine Equation (2021)    Anion gap 8 5 - 15    Comment: Performed at [facility], [address] [place], [place] [address]    Blood Alcohol level:  Lab Results  Component Value Date   ETH <10 12/04/2021   ETH <10 09/21/2021  Metabolic Disorder Labs: Lab Results  Component Value Date   HGBA1C 5.3 12/08/2021   MPG 105.41 12/08/2021   MPG 105.41 11/03/2020   No results found for: PROLACTIN Lab Results  Component Value Date   CHOL 159 12/08/2021   TRIG 177 (H) 12/08/2021   HDL 34 (L) 12/08/2021   CHOLHDL 4.7 12/08/2021   VLDL 35 12/08/2021   LDLCALC 90 12/08/2021   LDLCALC 116 (H) 06/29/2021    Physical Findings: AIMS: Facial and Oral Movements Muscles of Facial Expression: None, normal Lips and Perioral Area: None, normal Jaw: None, normal Tongue: None, normal,Extremity Movements Upper (arms, wrists, hands, fingers): None, normal Lower (legs, knees, ankles, toes): None,  normal, Trunk Movements Neck, shoulders, hips: None, normal, Overall Severity Severity of abnormal movements (highest score from questions above): None, normal Incapacitation due to abnormal movements: None, normal Patient's awareness of abnormal movements (rate only patient's report): No Awareness, Dental Status Current problems with teeth and/or dentures?: No Does patient usually wear dentures?: No  CIWA:  CIWA-Ar Total: 1  Musculoskeletal: Strength & Muscle Tone: within normal limits Gait & Station: normal Patient leans: N/A  Psychiatric Specialty Exam: Presentation  General Appearance: Appropriate for Environment; Casual; Fairly Groomed  Eye Contact: Good  Speech: Clear and Coherent; Normal Rate (Spontaneous)  Speech Volume: Normal  Handedness: Right   Mood and Affect  Mood: anxious, dysphoric  Affect: constricted  Thought Process  Thought Processes: Ruminative about conversation with family but superficially goal directed and linear for most of interview  Descriptions of Associations: Intact  Orientation: Full (Time, Place and Person)  Thought Content: Ruminations about family; denies AVH, paranoia, delusions, ideas of reference or first rank symptoms  History of Schizophrenia/Schizoaffective disorder: No  Hallucinations: Hallucinations: None  Ideas of Reference: None  Suicidal Thoughts: Suicidal Thoughts: No  Homicidal Thoughts: Homicidal Thoughts: No   Sensorium  Memory: Immediate Good; Recent Good; Remote Fair  Judgment: Fair  Insight: Fair   Community education officer  Concentration: Good  Attention Span: Good  Recall: Good  Fund of Knowledge: Good  Language: Good   Psychomotor Activity  Psychomotor Activity:Psychomotor Activity: Normal  Assets  Assets:Communication Skills; Desire for Improvement; Financial Resources/Insurance; Resilience   Physical Exam: Body mass index is 57.92 kg/m. Temp:  [97.7 F (36.5 C)] 97.7 F (36.5 C)  (01/17 0606) Pulse Rate:  [71-96] 96 (01/17 0607) Resp:  [18] 18 (01/17 0606) BP: (126-145)/(80-103) 134/86 (01/17 0607) SpO2:  [99 %-100 %] 99 % (01/17 0606)  Physical Exam Constitutional:      Appearance: Normal appearance.  HENT:     Head: Normocephalic.  Pulmonary:     Effort: Pulmonary effort is normal.  Neurological:     General: No focal deficit present.     Mental Status: She is alert and oriented to person, place, and time. Mental status is at baseline.    Review of Systems  Respiratory:  Negative for shortness of breath.   Cardiovascular:  Negative for chest pain and palpitations.  Gastrointestinal:  Positive for constipation. Negative for diarrhea, nausea and vomiting.  Neurological:  Negative for dizziness and headaches.   Treatment Plan & Assessment Summary: Principal Problem:   Severe episode of recurrent major depressive disorder, without psychotic features ([place]) Active Problems:   PTSD (post-traumatic stress disorder)   Cluster B personality disorder (HCC)   Sleep apnea   Benign essential HTN   Alexandra Gray is a 31 y.o. female with MDD, PTSD, alcohol use disorder, and Cluster B personality traits who  is admitted for recurrent MDD with SI. [facility] Day 7   PLAN: MDD recurrent severe without psychotic features PTSD -Continue Oxcarbazepine 150mg  BID for irritability/mood stabilization  - Na+ 136 on 12/13/21 -Continue Desvenlafaxine 50mg  daily for depressive symptoms -Increase Prazosin to 2mg  qhs for residual PTSD-related nightmares (discussed need to watch for orthostatic BP change on medication) -Add 50mg  Seroquel qhs to help with sleep and anxiety (r/b/se/a to med discussed including risk of weight gain, TD/EPS, elevated glucose, and elevated lipids and she consents to med trial)  - Lipids WNL except for triglycerides 177 and HDL 34, A1c 5.3, QTC pending -Discontinue Melatonin with start of Seroquel - Start Buspar 5mg  tid for irritability, anxiety  (r/b/se/a to med discussed and seh consents to med trial) Received 7.5 mg once today and tolerated dose  Alcohol use disorder -CIWA protocol with Ativan 1mg  for scores >10 (recent CIWA score 1) -Brief intervention and continuing to advise alcohol cessation  Cluster B personality traits (r/o BPD) Recommend DBT or CBT at discharge, in addition to beneficial effects of above regimen.  Chronic hypertension Pressures improved but will watch as now increasing Prazosin to 2mg . -Continue Amlodipine 5mg   Contraception/reproductive health S/p Depo-Provera administration. -F/u with PCP in 3 months for repeat injection   Obstructive sleep apnea Pt with headaches, daytime somnolence, snoring, and waking up gasping for air at night. -PCP follow up with sleep study referral for CPAP  Constipation - Start Miralax daily and encourage fluid hydration  Leukocytosis- resolved - WBC 9.5 today  Hypertriglyceridemia - Will need f/u with PCP after discharge - healthier diet and weight loss encouraged  Dispo: May go to supportive friend's house before returning to home with Godmother and two children  Safety, monitoring and disposition planning: The patient was seen and evaluated on the unit.  The patient's chart was reviewed and nursing notes were reviewed.  The patient's case was discussed in multidisciplinary team meeting.  Plan and drug side effects were discussed with patient who was amendable. Social work and case management to assist with discharge planning and identification of hospital follow-up needs prior to discharge. Discharge Concerns: Need to establish a safety plan; Medication compliance and effectiveness Discharge Goals: Return home with outpatient referrals for mental health follow-up including medication management/psychotherapy Safety and Monitoring: Voluntary status at inpatient psychiatric unit for safety, stabilization and treatment Daily contact with patient to assess and  evaluate symptoms and progress in treatment and medical management Patient's case to be discussed in multi-disciplinary team meeting Observation Level: Per above Vital signs:  q12 hours Precautions: suicide   Signed: Bain, [place] 12/13/2021, 3:49 PM   Attestation for Student Documentation:   I certify that I saw and interviewed the patient together with the medical student and was present for the duration of the interview.  I reviewed the medical record.  I performed or reperformed the mental status examination of the patient as indicated.  I formulated the assessment and plan of treatment as documented with edits noted above.  Viann Fish, MD, [facility] Ponder

## 2021-12-13 NOTE — [facility] Group Notes (Signed)
[facility] Group Notes:  (Nursing/MHT/Case Management/Adjunct)  Date:  12/13/2021  Time:  9:17 PM  Type of Therapy:   wrap-up group  Participation Level:  Active  Participation Quality:  Appropriate and Attentive  Affect:  Appropriate  Cognitive:  Appropriate  Insight:  Appropriate, Good, and Improving  Engagement in Group:  Developing/Improving  Modes of Intervention:  Discussion  Summary of Progress/Problems: PT came to group in a good mood. Pt participated well and socialized with peers.   Lorita Officer 12/13/2021, 9:17 PM

## 2021-12-13 NOTE — Progress Notes (Signed)
Progress note  Pt found in bed; compliant with medication administration. Pt denies all for this writer but seems anxious and worried about discharge planning. Pt has been seen in groups interacting with peers appropriately. Pt was approached for prn constipation medication and they stated they would approach staff when they felt they needed this. Pt is pleasant. Pt denies si/hi/ah/vh and verbally agrees to approach staff if these become apparent or before harming themselves/others while at [place].  A: Pt provided support and encouragement. Pt given medication per protocol and standing orders. Q94m safety checks implemented and continued.  R: Pt safe on the unit. Will continue to monitor.

## 2021-12-14 MED ORDER — QUETIAPINE FUMARATE ER 50 MG PO TB24: 50.0000 mg | ORAL_TABLET | Freq: Every day | ORAL | Status: DC

## 2021-12-14 MED ORDER — PRAZOSIN HCL 2 MG PO CAPS
2.0000 mg | ORAL_CAPSULE | Freq: Every day | ORAL | Status: DC
  Administered 2021-12-14 – 2021-12-15 (×2): 2 mg via ORAL
  Filled 2021-12-14 (×4): qty 1

## 2021-12-14 MED ORDER — PRAZOSIN HCL 2 MG PO CAPS: 2.0000 mg | ORAL_CAPSULE | Freq: Every day | ORAL | Status: DC

## 2021-12-14 MED ORDER — QUETIAPINE FUMARATE ER 50 MG PO TB24
50.0000 mg | ORAL_TABLET | Freq: Every day | ORAL | Status: DC
  Administered 2021-12-14: 50 mg via ORAL
  Filled 2021-12-14 (×2): qty 1

## 2021-12-14 NOTE — [facility] Group Notes (Signed)
Patients were asked to read '' There's a hole in my sidewalk '' by Mabeline Caras. They were asked to identify negative behavioral patterns as it pertains to mental health and contributing factors to mental health outcomes. Pt attended group, participation was minimal but appropriate.

## 2021-12-14 NOTE — Progress Notes (Addendum)
[facility] MD Progress Note  12/14/2021 9:05 AM Alexandra Gray  MRN: [id number]  CC: depression   Subjective: Alexandra Gray is a 31 y.o. female with PPHx of MDD, PTSD, Alcohol use disorder and Cluster B traits who was admitted to [facility] for management and treatment of MDD with SI.  [facility] Day 8   Overnight Events: No acute events overnight or behavioral issues noted in chart. Patient was compliant with scheduled meds, no agitation PRN's required, and attended group therapies appropriately. CIWA-Ar Total: 4  PRNs: Advil for headache Patient slept 8hours  Interim History: Patient was evaluated today with attending Dr. Nelda Marseille.  Patient reported feeling drowsy this AM however had markedly improved sleep with no middle insomnia or nightmares. Reported primary insomnia and patient was amendable to scheduling PM meds earlier to help with those concerns. Patient continued to have anxiety however depression has improved from yesterday. Patient denied feeling angry or upset. She denied EtOH withdrawal symptoms.  She reported that her appetite is stable. Patient denied medication side effects and is tolerating it well. Patient denied SI/HI/AVH, delusions, paranoia, ideas of reference, or first rank symptoms, and contracted for safety on the unit. She reports some mild constipation but denies other physical complaints.  Diagnosis: Principal Problem:   Severe episode of recurrent major depressive disorder, without psychotic features ([place]) Active Problems:   PTSD (post-traumatic stress disorder)   Cluster B personality disorder (HCC)   Sleep apnea   Benign essential HTN  Total Time spent with patient:  I personally spent 30 minutes on the unit in direct patient care. The direct patient care time included face-to-face time with the patient, reviewing the patient's chart, communicating with other professionals, and coordinating care. Greater than 50% of this time was spent in  counseling or coordinating care with the patient regarding goals of hospitalization, psycho-education, and discharge planning needs.   Past Psychiatric History: See H&P  Past Medical History:  Past Medical History:  Diagnosis Date   Cesarean delivery delivered 01/04/2019   Chlamydia    Chronic hypertension during pregnancy, antepartum 07/30/2018   BP elevated 12/21, 01/29/18, 05/11/18, 07/29/18  Valu.Nieves ] Aspirin 81 mg daily after 12 weeks Current antihypertensives:  None   Baseline and surveillance labs (pulled in from [facility], refresh links as needed)  Lab Results  Component Value Date   PLT 218 07/29/2018   CREATININE 0.59 07/29/2018   AST 16 07/29/2018   ALT 15 07/29/2018              Antenatal Testing CHTN - O10.919  Group I  BP < 140/90, no preecl   Depression    doing ok now   Gestational diabetes    Pt states she does not have DMII   Gonorrhea    H/O: C-section 01/04/2019   H/O: C-section 01/04/2019   History of cesarean delivery affecting pregnancy 10/03/2015   x1 in 2016, currently plans RLTCS  Declines TOLAC. Form signed 09/20/2018    History of cesarean delivery affecting pregnancy 10/03/2015   x1 in 2016, currently plans RLTCS  Declines TOLAC. Form signed 09/20/2018    History of cesarean delivery affecting pregnancy 10/03/2015   x1 in 2016, currently plans RLTCS  Declines TOLAC. Form signed 09/20/2018    History of pregnancy induced hypertension 12/06/2016   Hx of trichomoniasis    Hypertension    sometimes is up, never on meds   Mental disorder    Ovarian cyst    PID (pelvic inflammatory disease)  Sleep apnea     Past Surgical History:  Procedure Laterality Date   CESAREAN SECTION N/A 10/03/2015   Procedure: CESAREAN SECTION;  Surgeon: Jonnie Kind, MD;  Location: [facility];  Service: Obstetrics;  Laterality: N/A;   CESAREAN SECTION N/A 01/04/2019   Procedure: CESAREAN SECTION;  Surgeon: Chancy Milroy, MD;  Location: [place];  Service: Obstetrics;  Laterality: N/A;    CESAREAN SECTION N/A    Phreesia 03/08/2021   KNEE SURGERY     Family History:  Family History  Problem Relation Age of Onset   Healthy Mother    Healthy Father    Healthy Brother    Cancer Neg Hx    Diabetes Neg Hx    Heart disease Neg Hx    Hypertension Neg Hx    Stroke Neg Hx    Hearing loss Neg Hx    Family Psychiatric  History: See H&P  Social History:  Social History   Substance and Sexual Activity  Alcohol Use Yes   Comment: 10 weekly     Social History   Substance and Sexual Activity  Drug Use Yes   Types: Marijuana   Comment: 1/8 weekly    Social History   Socioeconomic History   Marital status: Single    Spouse name: Not on file   Number of children: 2   Years of education: 9   Highest education level: Not on file  Occupational History   Occupation: disabled  Tobacco Use   Smoking status: Every Day    Types: Cigars   Smokeless tobacco: Never  Vaping Use   Vaping Use: Never used  Substance and Sexual Activity   Alcohol use: Yes    Comment: 10 weekly   Drug use: Yes    Types: Marijuana    Comment: 1/8 weekly   Sexual activity: Not Currently    Birth control/protection: None  Other Topics Concern   Not on file  Social History Narrative   Lives alone in a one story home.  Has one child.     On disability for depression and PTSD.     Education: 9th grade.    She is in school for criminal justice.   Social Determinants of Health   Financial Resource Strain: Not on file  Food Insecurity: Not on file  Transportation Needs: Not on file  Physical Activity: Not on file  Stress: Not on file  Social Connections: Not on file   Appetite: Fair  Current Medications: Current Facility-Administered Medications  Medication Dose Route Frequency Provider Last Rate Last Admin   acetaminophen (TYLENOL) tablet 650 mg  650 mg Oral Q6H PRN Rankin, Shuvon B, NP   650 mg at 12/13/21 0741   alum & mag hydroxide-simeth (MAALOX/MYLANTA) 200-200-20 MG/5ML  suspension 30 mL  30 mL Oral Q4H PRN Rankin, Shuvon B, NP       amLODipine (NORVASC) tablet 5 mg  5 mg Oral Daily Merrily Brittle, DO   5 mg at 12/14/21 0745   busPIRone (BUSPAR) tablet 5 mg  5 mg Oral TID Harlow Asa, MD   5 mg at 12/14/21 0747   desvenlafaxine (PRISTIQ) 24 hr tablet 50 mg  50 mg Oral Daily Merrily Brittle, DO   50 mg at 12/14/21 0746   ibuprofen (ADVIL) tablet 400 mg  400 mg Oral Q6H PRN Bobbitt, Shalon E, NP   400 mg at 12/13/21 2124   loperamide (IMODIUM) capsule 2-4 mg  2-4 mg Oral PRN Viann Fish  E, MD       LORazepam (ATIVAN) tablet 1 mg  1 mg Oral Q6H PRN Mason JimSingleton, Diquan Kassis E, MD       magnesium hydroxide (MILK OF MAGNESIA) suspension 30 mL  30 mL Oral Daily PRN Rankin, Shuvon B, NP       ondansetron (ZOFRAN-ODT) disintegrating tablet 4 mg  4 mg Oral Q6H PRN Comer LocketSingleton, Saidi Santacroce E, MD       OXcarbazepine (TRILEPTAL) tablet 150 mg  150 mg Oral BID Princess BruinsNguyen, Julie, DO   150 mg at 12/14/21 0745   pneumococcal 23 valent vaccine (PNEUMOVAX-23) injection 0.5 mL  0.5 mL Intramuscular Tomorrow-1000 Mason JimSingleton, Deziree Mokry E, MD       polyethylene glycol (MIRALAX / GLYCOLAX) packet 17 g  17 g Oral Daily Princess BruinsNguyen, Julie, DO   17 g at 12/14/21 0746   prazosin (MINIPRESS) capsule 2 mg  2 mg Oral QHS Princess BruinsNguyen, Julie, DO       QUEtiapine (SEROQUEL XR) 24 hr tablet 50 mg  50 mg Oral QHS Princess BruinsNguyen, Julie, DO        Lab Results:  Results for orders placed or performed during the hospital encounter of 12/06/21 (from the past 48 hour(s))  CBC with Differential/Platelet     Status: Abnormal   Collection Time: 12/13/21  6:28 AM  Result Value Ref Range   WBC 9.5 4.0 - 10.5 K/uL   RBC 5.23 (H) 3.87 - 5.11 MIL/uL   Hemoglobin 13.8 12.0 - 15.0 g/dL   HCT 16.143.2 09.636.0 - 04.546.0 %   MCV 82.6 80.0 - 100.0 fL   MCH 26.4 26.0 - 34.0 pg   MCHC 31.9 30.0 - 36.0 g/dL   RDW 40.914.4 81.111.5 - 91.415.5 %   Platelets 308 150 - 400 K/uL   nRBC 0.0 0.0 - 0.2 %   Neutrophils Relative % 60 %   Neutro Abs 5.7 1.7 - 7.7 K/uL   Lymphocytes  Relative 32 %   Lymphs Abs 3.0 0.7 - 4.0 K/uL   Monocytes Relative 5 %   Monocytes Absolute 0.5 0.1 - 1.0 K/uL   Eosinophils Relative 2 %   Eosinophils Absolute 0.2 0.0 - 0.5 K/uL   Basophils Relative 0 %   Basophils Absolute 0.0 0.0 - 0.1 K/uL   Immature Granulocytes 1 %   Abs Immature Granulocytes 0.06 0.00 - 0.07 K/uL    Comment: Performed at [facility], [address] [place], [place] [address]  Basic metabolic panel     Status: Abnormal   Collection Time: 12/13/21  6:28 AM  Result Value Ref Range   Sodium 136 135 - 145 mmol/L   Potassium 4.0 3.5 - 5.1 mmol/L   Chloride 102 98 - 111 mmol/L   CO2 26 22 - 32 mmol/L   Glucose, Bld 107 (H) 70 - 99 mg/dL    Comment: Glucose reference range applies only to samples taken after fasting for at least 8 hours.   BUN 16 6 - 20 mg/dL   Creatinine, Ser 6.210.69 0.44 - 1.00 mg/dL   Calcium 9.3 8.9 - 30.810.3 mg/dL   GFR, Estimated >65>60 >78>60 mL/min    Comment: (NOTE) Calculated using the CKD-EPI Creatinine Equation (2021)    Anion gap 8 5 - 15    Comment: Performed at [facility], [address] [place], [place] [address]    Blood Alcohol level:  Lab Results  Component Value Date   ETH <10 12/04/2021   ETH <10 09/21/2021  Metabolic Disorder Labs: Lab Results  Component Value Date   HGBA1C 5.3 12/08/2021   MPG 105.41 12/08/2021   MPG 105.41 11/03/2020   No results found for: PROLACTIN Lab Results  Component Value Date   CHOL 159 12/08/2021   TRIG 177 (H) 12/08/2021   HDL 34 (L) 12/08/2021   CHOLHDL 4.7 12/08/2021   VLDL 35 12/08/2021   LDLCALC 90 12/08/2021   LDLCALC 116 (H) 06/29/2021    Physical Findings: AIMS: Facial and Oral Movements Muscles of Facial Expression: None, normal Lips and Perioral Area: None, normal Jaw: None, normal Tongue: None, normal,Extremity Movements Upper (arms, wrists, hands, fingers): None, normal Lower (legs, knees, ankles, toes): None, normal, Trunk  Movements Neck, shoulders, hips: None, normal, Overall Severity Severity of abnormal movements (highest score from questions above): None, normal Incapacitation due to abnormal movements: None, normal Patient's awareness of abnormal movements (rate only patient's report): No Awareness, Dental Status Current problems with teeth and/or dentures?: No Does patient usually wear dentures?: No  CIWA:  CIWA-Ar Total: 4  Musculoskeletal: Strength & Muscle Tone: within normal limits Gait & Station: normal Patient leans: N/A  Psychiatric Specialty Exam: Presentation General Appearance: Appropriate for Environment; Casual; Fairly Groomed  Eye Contact: Good  Speech: Clear and Coherent; Normal Rate (Spontaneous)  Speech Volume: Normal  Handedness: Right   Mood and Affect  Mood: described as improved - appears less anxious  Affect: congruent, full range  Thought Process  Thought Processes: coherent, linear, goal directed  Descriptions of Associations: Intact  Orientation: Full (Time, Place and Person)  Thought Content: Denied SI/HIAVH, paranoia, delusions, ideas of reference or first rank symptoms  History of Schizophrenia/Schizoaffective disorder: No  Hallucinations: Hallucinations: None  Ideas of Reference: None  Suicidal Thoughts: Suicidal Thoughts: No  Homicidal Thoughts: Homicidal Thoughts: No   Sensorium  Memory: Immediate Good; Recent Good; Remote Fair  Judgment: Fair  Insight: Fair   Community education officer  Concentration: Good  Attention Span: Good  Recall: Good  Fund of Knowledge: Good  Language: Good   Psychomotor Activity  Psychomotor Activity:Psychomotor Activity: Normal  Assets  Assets:Communication Skills; Desire for Improvement; Financial Resources/Insurance; Resilience   Physical Exam: Body mass index is 57.92 kg/m. Temp:  [97.8 F (36.6 C)] 97.8 F (36.6 C) (01/18 0603) Pulse Rate:  [69-92] 78 (01/18 0606) BP: (117-148)/(68-88)  132/68 (01/18 0606) SpO2:  [84 %-100 %] 100 % (01/18 [id number])  Physical Exam Constitutional:      Appearance: Normal appearance.  HENT:     Head: Normocephalic.  Pulmonary:     Effort: Pulmonary effort is normal.  Neurological:     General: No focal deficit present.     Mental Status: She is alert and oriented to person, place, and time. Mental status is at baseline.    Review of Systems  Respiratory:  Negative for shortness of breath.   Cardiovascular:  Negative for chest pain and palpitations.  Gastrointestinal:  Positive for constipation. Negative for diarrhea, nausea and vomiting.  Neurological:  Negative for dizziness and headaches.   Treatment Plan & Assessment Summary: Principal Problem:   Severe episode of recurrent major depressive disorder, without psychotic features ([place]) Active Problems:   PTSD (post-traumatic stress disorder)   Cluster B personality disorder (HCC)   Sleep apnea   Benign essential HTN   Alexandra Gray is a 31 y.o. female with MDD, PTSD, alcohol use disorder, and Cluster B personality traits who is admitted for recurrent MDD with SI. [facility] Day 8   PLAN: MDD  recurrent severe without psychotic features PTSD -Continue Oxcarbazepine 150mg  BID for irritability/mood stabilization  - Na+ 136 on 12/13/21 -Continue Desvenlafaxine 50mg  daily for depressive symptoms -Continue Prazosin 2mg  qhs for residual PTSD-related nightmares  -Continue 50mg  Seroquel qhs to help with sleep and anxiety  - Lipids WNL except for triglycerides 177 and HDL 34, A1c 5.3, QTc 452 -Continue Buspar 5mg  tid for irritability, anxiety   Alcohol use disorder -CIWA protocol with Ativan 1mg  for scores >10 (recent CIWA score 4) -Brief intervention and continuing to advise alcohol cessation  Cluster B personality traits (r/o BPD) Recommend DBT or CBT at discharge, in addition to beneficial effects of above regimen.  Chronic hypertension Pressures improved but will watch as now  increasing Prazosin to 2mg . -Continue Amlodipine 5mg   Contraception/reproductive health S/p Depo-Provera administration. -F/u with PCP in 3 months for repeat injection   Obstructive sleep apnea Pt with headaches, daytime somnolence, snoring, and waking up gasping for air at night. -PCP follow up with sleep study referral for CPAP  Constipation - Continue Miralax daily and encourage fluid hydration  Leukocytosis- resolved - WBC 9.5   Hypertriglyceridemia - Will need f/u with PCP after discharge - healthier diet and weight loss encouraged  Dispo: Home to supportive friend's house before returning to home with Godmother and two children  Safety, monitoring and disposition planning: The patient was seen and evaluated on the unit.  The patient's chart was reviewed and nursing notes were reviewed.  The patient's case was discussed in multidisciplinary team meeting.  Plan and drug side effects were discussed with patient who was amendable. Social work and case management to assist with discharge planning and identification of hospital follow-up needs prior to discharge. Discharge Concerns: Need to establish a safety plan; Medication compliance and effectiveness Discharge Goals: Return home with outpatient referrals for mental health follow-up including medication management/psychotherapy Safety and Monitoring: Voluntary status at inpatient psychiatric unit for safety, stabilization and treatment Daily contact with patient to assess and evaluate symptoms and progress in treatment and medical management Patient's case to be discussed in multi-disciplinary team meeting Observation Level: Per above Vital signs:  q12 hours Precautions: suicide   Signed: Merrily Brittle, DO Psychiatry Resident, PGY-1 [facility] 12/14/2021, 6:11 PM

## 2021-12-14 NOTE — Progress Notes (Signed)
Progress note  Pt found in bed; compliant with medication administration. Pt feels like they are progressing but still seems anxious and pensive on approach. Pt has been seen in the milieu interacting with peers and animated. Pt is pleasant. Pt denies si/hi/ah/vh and verbally agrees to approach staff if these become apparent or before harming themselves/others while at [place].  A: Pt provided support and encouragement. Pt given medication per protocol and standing orders. Q35m safety checks implemented and continued.  R: Pt safe on the unit. Will continue to monitor.

## 2021-12-14 NOTE — Plan of Care (Signed)
  Problem: Activity: Goal: Risk for activity intolerance will decrease Outcome: Progressing   Problem: Nutrition: Goal: Adequate nutrition will be maintained Outcome: Progressing   Problem: Coping: Goal: Level of anxiety will decrease Outcome: Progressing   

## 2021-12-14 NOTE — [facility] Group Notes (Signed)
Pt attended group and participated in discussion. 

## 2021-12-14 NOTE — Group Note (Signed)
Recreation Therapy Group Note   Group Topic:Stress Management  Group Date: 12/14/2021 Start Time: 0935 End Time: 0957 Facilitators: Victorino Sparrow, LRT,CTRS Location: [address] Dayroom   Goal Area(s) Addresses:  Patient will actively participate in stress management techniques presented during session.  Patient will successfully identify benefit of practicing stress management post d/c.    Group Description: Guided Imagery. LRT provided education, instruction, and demonstration on practice of visualization via guided imagery. Patient was asked to participate in the technique introduced during session. LRT debriefed including topics of mindfulness, stress management and specific scenarios each patient could use these techniques. Patients were given suggestions of ways to access scripts post d/c and encouraged to explore Youtube and other apps available on smartphones, tablets, and computers.   Affect/Mood: Appropriate   Participation Level: Active   Participation Quality: Independent   Behavior: Appropriate   Speech/Thought Process: Focused   Insight: Good   Judgement: Good   Modes of Intervention: Script, Nature Sounds   Patient Response to Interventions:  Engaged   Education Outcome:  Acknowledges education and In group clarification offered    Clinical Observations/Individualized Feedback: Pt attended and participated in group.    Plan: Continue to engage patient in RT group sessions 2-3x/week.   Victorino Sparrow, LRT,CTRS 12/14/2021 11:34 AM

## 2021-12-14 NOTE — Group Note (Signed)
LCSW Group Therapy Note  Group Date: 12/14/2021 Start Time: 1300 End Time: 1400   Type of Therapy and Topic:  Group Therapy - Healthy vs Unhealthy Coping Skills  Participation Level:  Active   Description of Group The focus of this group was to determine what unhealthy coping techniques typically are used by group members and what healthy coping techniques would be helpful in coping with various problems. Patients were guided in becoming aware of the differences between healthy and unhealthy coping techniques. Patients were asked to identify 2-3 healthy coping skills they would like to learn to use more effectively.  Therapeutic Goals Patients learned that coping is what human beings do all day long to deal with various situations in their lives Patients defined and discussed healthy vs unhealthy coping techniques Patients identified their preferred coping techniques and identified whether these were healthy or unhealthy Patients determined 2-3 healthy coping skills they would like to become more familiar with and use more often. Patients provided support and ideas to each other   Summary of Patient Progress:  Patient proved open to input from peers and feedback from CSW. Patient demonstrated insight into the subject matter, was respectful of peers, and participated throughout the entire session.   Therapeutic Modalities Cognitive Behavioral Therapy Motivational Interviewing  Chrys Racer 12/14/2021  1:17 PM
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
## 2021-12-15 MED ORDER — QUETIAPINE FUMARATE 50 MG PO TABS
50.0000 mg | ORAL_TABLET | Freq: Every day | ORAL | Status: DC
  Administered 2021-12-15: 50 mg via ORAL
  Filled 2021-12-15 (×3): qty 1

## 2021-12-15 NOTE — Plan of Care (Signed)
  Problem: Education: Goal: Knowledge of General Education information will improve Description: Including pain rating scale, medication(s)/side effects and non-pharmacologic comfort measures Outcome: Progressing   Problem: Health Behavior/Discharge Planning: Goal: Ability to manage health-related needs will improve Outcome: Progressing   Problem: Clinical Measurements: Goal: Respiratory complications will improve Outcome: Progressing   

## 2021-12-15 NOTE — Progress Notes (Addendum)
[facility] MD Progress Note  12/15/2021 9:50 AM Alexandra Gray  MRN: [id number]  CC: Depression and SI   Subjective: Alexandra Gray is a 31 y.o. female with PPHx of MDD, PTSD, Alcohol use disorder, and Cluster B who was admitted to [facility] for management and treatment of MDD with SI.  [facility] Day 9   Overnight Events: No acute events overnight or behavioral issues noted in chart. Patient was compliant with scheduled meds,  no agitation PRN's required, and attended group therapies appropriately. CIWA-Ar Total: 0  PRNs: None Patient slept 7.75 hours.  Interim History: Patient was evaluated today with attending Dr. Nelda Marseille. Patient was initially seen in group room , and was pleasant and cooperative with evaluation.  Patient reported feeling "better". She notes that she slept very well last night and denied having any nightmares. She notes that her anxiety is a 3/10 today, and rates her depression as a 4/10. She has been eating well, going to groups, and practicing self-hygiene. She has made plans to discharge to her friend's house and feels she will be ready to leave tomorrow. Patient denied medication side effects and is tolerating medications well.Patient denied SI/HI/AVH, delusions, paranoia, first rank symptoms, and contracted for safety on the unit. Patient was not grossly responding to internal/external stimuli nor made any delusional statements during encounter. Otherwise, patient had no other concerns or questions and voiced no physical complaints.  Diagnosis: Principal Problem:   Severe episode of recurrent major depressive disorder, without psychotic features ([place]) Active Problems:   Cluster B personality disorder (HCC)   PTSD (post-traumatic stress disorder)   Sleep apnea   Benign essential HTN   Total Time spent with patient: I personally spent 30 minutes on the unit in direct patient care. The direct patient care time included face-to-face time with the  patient, reviewing the patient's chart, communicating with other professionals, and coordinating care. Greater than 50% of this time was spent in counseling or coordinating care with the patient regarding goals of hospitalization, psycho-education, and discharge planning needs.   Past Psychiatric History: See H&P  Past Medical History:  Past Medical History:  Diagnosis Date   Cesarean delivery delivered 01/04/2019   Chlamydia    Chronic hypertension during pregnancy, antepartum 07/30/2018   BP elevated 12/21, 01/29/18, 05/11/18, 07/29/18  Valu.Nieves ] Aspirin 81 mg daily after 12 weeks Current antihypertensives:  None   Baseline and surveillance labs (pulled in from [facility], refresh links as needed)  Lab Results  Component Value Date   PLT 218 07/29/2018   CREATININE 0.59 07/29/2018   AST 16 07/29/2018   ALT 15 07/29/2018              Antenatal Testing CHTN - O10.919  Group I  BP < 140/90, no preecl   Depression    doing ok now   Gestational diabetes    Pt states she does not have DMII   Gonorrhea    H/O: C-section 01/04/2019   H/O: C-section 01/04/2019   History of cesarean delivery affecting pregnancy 10/03/2015   x1 in 2016, currently plans RLTCS  Declines TOLAC. Form signed 09/20/2018    History of cesarean delivery affecting pregnancy 10/03/2015   x1 in 2016, currently plans RLTCS  Declines TOLAC. Form signed 09/20/2018    History of cesarean delivery affecting pregnancy 10/03/2015   x1 in 2016, currently plans RLTCS  Declines TOLAC. Form signed 09/20/2018    History of pregnancy induced hypertension 12/06/2016   Hx of trichomoniasis  Hypertension    sometimes is up, never on meds   Mental disorder    Ovarian cyst    PID (pelvic inflammatory disease)    Sleep apnea     Past Surgical History:  Procedure Laterality Date   CESAREAN SECTION N/A 10/03/2015   Procedure: CESAREAN SECTION;  Surgeon: Jonnie Kind, MD;  Location: [facility];  Service: Obstetrics;  Laterality: N/A;   CESAREAN SECTION N/A 01/04/2019    Procedure: CESAREAN SECTION;  Surgeon: Chancy Milroy, MD;  Location: [place];  Service: Obstetrics;  Laterality: N/A;   CESAREAN SECTION N/A    Phreesia 03/08/2021   KNEE SURGERY     Family History:  Family History  Problem Relation Age of Onset   Healthy Mother    Healthy Father    Healthy Brother    Cancer Neg Hx    Diabetes Neg Hx    Heart disease Neg Hx    Hypertension Neg Hx    Stroke Neg Hx    Hearing loss Neg Hx    Family Psychiatric History: See H&P  Social History:  Social History   Substance and Sexual Activity  Alcohol Use Yes   Comment: 10 weekly     Social History   Substance and Sexual Activity  Drug Use Yes   Types: Marijuana   Comment: 1/8 weekly    Social History   Socioeconomic History   Marital status: Single    Spouse name: Not on file   Number of children: 2   Years of education: 9   Highest education level: Not on file  Occupational History   Occupation: disabled  Tobacco Use   Smoking status: Every Day    Types: Cigars   Smokeless tobacco: Never  Vaping Use   Vaping Use: Never used  Substance and Sexual Activity   Alcohol use: Yes    Comment: 10 weekly   Drug use: Yes    Types: Marijuana    Comment: 1/8 weekly   Sexual activity: Not Currently    Birth control/protection: None  Other Topics Concern   Not on file  Social History Narrative   Lives alone in a one story home.  Has one child.     On disability for depression and PTSD.     Education: 9th grade.    She is in school for criminal justice.   Social Determinants of Health   Financial Resource Strain: Not on file  Food Insecurity: Not on file  Transportation Needs: Not on file  Physical Activity: Not on file  Stress: Not on file  Social Connections: Not on file    Appetite: Fair  Current Medications: Current Facility-Administered Medications  Medication Dose Route Frequency Provider Last Rate Last Admin   acetaminophen (TYLENOL) tablet 650 mg   650 mg Oral Q6H PRN Rankin, Shuvon B, NP   650 mg at 12/13/21 0741   alum & mag hydroxide-simeth (MAALOX/MYLANTA) 200-200-20 MG/5ML suspension 30 mL  30 mL Oral Q4H PRN Rankin, Shuvon B, NP       amLODipine (NORVASC) tablet 5 mg  5 mg Oral Daily Merrily Brittle, DO   5 mg at 12/15/21 0742   busPIRone (BUSPAR) tablet 5 mg  5 mg Oral TID Harlow Asa, MD   5 mg at 12/15/21 0742   desvenlafaxine (PRISTIQ) 24 hr tablet 50 mg  50 mg Oral Daily Merrily Brittle, DO   50 mg at 12/15/21 0742   ibuprofen (ADVIL) tablet 400 mg  400  mg Oral Q6H PRN Bobbitt, Shalon E, NP   400 mg at 12/13/21 2124   loperamide (IMODIUM) capsule 2-4 mg  2-4 mg Oral PRN Harlow Asa, MD       LORazepam (ATIVAN) tablet 1 mg  1 mg Oral Q6H PRN Nelda Marseille, Balian Schaller E, MD       magnesium hydroxide (MILK OF MAGNESIA) suspension 30 mL  30 mL Oral Daily PRN Rankin, Shuvon B, NP       ondansetron (ZOFRAN-ODT) disintegrating tablet 4 mg  4 mg Oral Q6H PRN Nelda Marseille, Quantavious Eggert E, MD       OXcarbazepine (TRILEPTAL) tablet 150 mg  150 mg Oral BID Merrily Brittle, DO   150 mg at 12/15/21 0742   polyethylene glycol (MIRALAX / GLYCOLAX) packet 17 g  17 g Oral Daily Merrily Brittle, DO   17 g at 12/15/21 [id number]   prazosin (MINIPRESS) capsule 2 mg  2 mg Oral QHS Merrily Brittle, DO   2 mg at 12/14/21 2136   QUEtiapine (SEROQUEL XR) 24 hr tablet 50 mg  50 mg Oral QHS Merrily Brittle, DO   50 mg at 12/14/21 2135    Lab Results:  No results found for this or any previous visit (from the past 48 hour(s)).  Blood Alcohol level:  Lab Results  Component Value Date   ETH <10 12/04/2021   ETH <10 [id number]    Metabolic Disorder Labs: Lab Results  Component Value Date   HGBA1C 5.3 12/08/2021   MPG 105.41 12/08/2021   MPG 105.41 11/03/2020   No results found for: PROLACTIN Lab Results  Component Value Date   CHOL 159 12/08/2021   TRIG 177 (H) 12/08/2021   HDL 34 (L) 12/08/2021   CHOLHDL 4.7 12/08/2021   VLDL 35 12/08/2021   LDLCALC 90 12/08/2021    LDLCALC 116 (H) 06/29/2021    Physical Findings: AIMS: Facial and Oral Movements Muscles of Facial Expression: None, normal Lips and Perioral Area: None, normal Jaw: None, normal Tongue: None, normal,Extremity Movements Upper (arms, wrists, hands, fingers): None, normal Lower (legs, knees, ankles, toes): None, normal, Trunk Movements Neck, shoulders, hips: None, normal, Overall Severity Severity of abnormal movements (highest score from questions above): None, normal Incapacitation due to abnormal movements: None, normal Patient's awareness of abnormal movements (rate only patient's report): No Awareness, Dental Status Current problems with teeth and/or dentures?: No Does patient usually wear dentures?: No  CIWA:  CIWA-Ar Total: 0     Musculoskeletal: Strength & Muscle Tone: within normal limits Gait & Station: normal Patient leans: N/A  Psychiatric Specialty Exam: Presentation  General Appearance: Appropriate for Environment; Casual  Eye Contact: Good  Speech: Clear and Coherent; Normal Rate  Speech Volume: Normal  Handedness: Right   Mood and Affect  Mood: described as improving - appears more euthymic  Affect: less constricted and less anxious appearing   Thought Process  Thought Processes: linear, goal directed  Descriptions of Associations: Intact  Orientation: Full (Time, Place and Person)  Thought Content: Denies AVH, paranoia, delusions, SI or HI   History of Schizophrenia/Schizoaffective disorder: No  Hallucinations: Hallucinations: None  Ideas of Reference: None  Suicidal Thoughts: Suicidal Thoughts: No  Homicidal Thoughts: Homicidal Thoughts: No   Sensorium  Memory: Immediate Good; Recent Good; Remote Good  Judgment: Fair  Insight: Fair   Community education officer  Concentration: Good  Attention Span: Good  Recall: Good  Fund of Knowledge: Fair; Good  Language: Good   Psychomotor Activity  Psychomotor Activity:Psychomotor  Activity: Normal  Assets  Assets:Communication Skills; Desire for Improvement; Housing; Social Support; Vocational/Educational   Sleep  Sleep: 7.75 hours   Physical Exam: Body mass index is 57.92 kg/m. Temp:  [98.7 F (37.1 C)] 98.7 F (37.1 C) (01/19 0616) Pulse Rate:  [80-102] 102 (01/19 0618) Resp:  [20] 20 (01/19 0618) BP: (103-135)/(60-96) 130/60 (01/19 0618) SpO2:  [75 %-100 %] 100 % (01/19 0616)  Physical Exam Constitutional:      Appearance: Normal appearance.  HENT:     Head: Normocephalic.  Pulmonary:     Effort: Pulmonary effort is normal.  Neurological:     General: No focal deficit present.     Mental Status: She is alert.    Review of Systems  Respiratory:  Negative for shortness of breath.   Cardiovascular:  Negative for chest pain.  Gastrointestinal:  Negative for diarrhea, nausea and vomiting.  Neurological:  Negative for dizziness.  Psychiatric/Behavioral:  Positive for depression (Improved from yesterrday). Negative for hallucinations and suicidal ideas. The patient is nervous/anxious (Improved from yesterday). The patient does not have insomnia.    Treatment Assessment & Plan Summary: Principal Problem:   Severe episode of recurrent major depressive disorder, without psychotic features ([place]) Active Problems:   Cluster B personality disorder ([place])   PTSD (post-traumatic stress disorder)   Sleep apnea   Benign essential HTN   Alexandra Gray is a 31 y.o. female with MDD, PTSD, alcohol use disorder, and [place] B personality disorder who is admitted for recurrent MDD with SI. Alexandra Gray's anxiety and depression have continued to improve each day. No longer having nightmares. Stable on medications. She endorses plans for new coping strategies, continuing with MWF outpatient therapy, and staying with a friend for appropriate support during her transition out of the hospital.  [facility] Day 9   PLAN: Recurrent, severe MDD without psychotic  features PTSD -Continue Oxcarbazepine 150mg  BID for irritability and mood stabilization -Continue Desvenlafaxine 50mg  daily for depressive symptoms -Continue Prazosin 2mg  qhs for PTSD-related nightmares -Switch XL to IR 50mg  seroquel qhs for AM drowsiness.( Lipids WNL except for triglycerides 177 and HDL 34, A1C at 5.3, QTC 475ms). -Continue Buspar 5mg  TID for irritability and anxiety - SW to see if she can get into PHP/IOP at discharge  Alcohol use disorder -CIWA protocol with Ativan 1mg  for scores >10 (recent score 0) -Brief intervention and continuing to advise alcohol cessation  Cluster B personality traits (r/o BPD) -Recommend DBT or CBT at discharge, in addition to beneficial effects of above regimen.  Chronic hypertension -Pressures improved and stable since adding on Prazosin 2mg  for nightmares, as noted above -Continue Amlodipine 5mg .   Contraceptive/reproductive health -S/p Depo-Provera administration -F/u with PCP in 3 months for repeat injection  Obstructive sleep apnea Pt with headaches, daytime somnolence, snoring and waking up gasping for air at night. -PCP f/u w/ sleep study referral for CPAP  Constipation -Continue Miralax daily and encouraged fluid hydration  Hypertriglyceridemia  -Will need f/u w/ PCP after discharge -Recommended healthier diet and appropriate weight loss  Dispo: Nolene Ebbs, MD Plan to discharge tomorrow Discharge to supportive friend's house before returning to home with Godmother/adoptive mother and two children, but will verify with friend for safe discharge Pt with PCP appointment for 2/4 Working with SW to see if pt could qualify for a PHP   Safety, monitoring and disposition planning: The patient was seen and evaluated on the unit.  The patient's chart was reviewed and nursing notes were reviewed.  The patient's case was discussed  in multidisciplinary team meeting.  Plan and drug side effects were discussed with patient who  was amendable. Social work and case management to assist with discharge planning and identification of hospital follow-up needs prior to discharge. Discharge Concerns: Need to establish a safety plan; Medication compliance and effectiveness Discharge Goals: Return home with outpatient referrals for mental health follow-up including medication management/psychotherapy Safety and Monitoring: Voluntary status at inpatient psychiatric unit for safety, stabilization and treatment Daily contact with patient to assess and evaluate symptoms and progress in treatment and medical management Patient's case to be discussed in multi-disciplinary team meeting Observation Level: Per above Vital signs:  q12 hours Precautions: Per chart  Signed: [place] 12/15/2021, 9:50 AM   Attestation for Student Documentation:   I personally was present and performed or re-performed the history, physical exam and medical decision-making activities of this service and have verified that the service and findings are accurately documented in the students note with edits made above.  Viann Fish, MD, Alda Ponder

## 2021-12-15 NOTE — Progress Notes (Signed)
°   12/15/21 0658  Sleep  Number of Hours 7.75

## 2021-12-15 NOTE — Plan of Care (Signed)
°  Problem: Nutrition: Goal: Adequate nutrition will be maintained Outcome: Progressing   Problem: Coping: Goal: Level of anxiety will decrease Outcome: Progressing   Problem: Coping: Goal: Coping ability will improve Outcome: Progressing   Problem: Medication: Goal: Compliance with prescribed medication regimen will improve Outcome: Progressing

## 2021-12-15 NOTE — Progress Notes (Signed)
The patient rated her day as a 10 out of 10 since she anticipates being discharged tomorrow. The patient needs to have access to her cell phone in order to reach her friend.

## 2021-12-15 NOTE — Progress Notes (Signed)
Was informed by Milana Na that the patient was declined for [facility] PHP services, due to substance use.

## 2021-12-15 NOTE — Progress Notes (Signed)
Progress note    12/15/21 0742  Psych Admission Type (Psych Patients Only)  Admission Status Voluntary  Psychosocial Assessment  Patient Complaints Anxiety  Eye Contact Fair  Facial Expression Animated;Anxious  Affect Anxious  Speech Logical/coherent  Interaction Assertive  Motor Activity Slow  Appearance/Hygiene Unremarkable  Behavior Characteristics Cooperative;Appropriate to situation;Anxious  Mood Anxious;Pleasant  Thought Process  Coherency Concrete thinking  Content WDL  Delusions None reported or observed  Perception WDL  Hallucination None reported or observed  Judgment WDL  Confusion None  Danger to Self  Current suicidal ideation? Denies  Danger to Others  Danger to Others None reported or observed

## 2021-12-15 NOTE — Progress Notes (Signed)
° ° °   12/15/21 2150  Psych Admission Type (Psych Patients Only)  Admission Status Voluntary  Psychosocial Assessment  Patient Complaints None  Eye Contact Fair  Facial Expression Animated;Anxious  Affect Anxious  Speech Logical/coherent  Interaction Assertive  Motor Activity Slow  Appearance/Hygiene Unremarkable  Behavior Characteristics Cooperative;Appropriate to situation  Mood Pleasant;Anxious  Thought Process  Coherency Concrete thinking  Content WDL  Delusions None reported or observed  Perception WDL  Hallucination None reported or observed  Judgment WDL  Confusion None  Danger to Self  Current suicidal ideation? Denies  Danger to Others  Danger to Others None reported or observed

## 2021-12-15 NOTE — Progress Notes (Signed)
°   12/14/21 2244  Psych Admission Type (Psych Patients Only)  Admission Status Voluntary  Psychosocial Assessment  Patient Complaints Isolation  Eye Contact Fair  Facial Expression Animated;Anxious  Affect Anxious;Appropriate to circumstance  Speech Logical/coherent  Interaction Assertive  Motor Activity Slow  Appearance/Hygiene Unremarkable  Behavior Characteristics Appropriate to situation  Mood Depressed  Thought Process  Coherency WDL  Content WDL  Delusions None reported or observed  Perception WDL  Hallucination None reported or observed  Judgment WDL  Confusion None  Danger to Self  Current suicidal ideation? Denies  Danger to Others  Danger to Others None reported or observed

## 2021-12-16 ENCOUNTER — Encounter (HOSPITAL_COMMUNITY): Payer: Self-pay

## 2021-12-16 MED ORDER — DESVENLAFAXINE SUCCINATE ER 50 MG PO TB24: 50.0000 mg | ORAL_TABLET | Freq: Every day | ORAL | 0 refills | Status: DC

## 2021-12-16 MED ORDER — AMLODIPINE BESYLATE 5 MG PO TABS: 5.0000 mg | ORAL_TABLET | Freq: Every day | ORAL | 0 refills | Status: DC

## 2021-12-16 MED ORDER — BUSPIRONE HCL 5 MG PO TABS: 5.0000 mg | ORAL_TABLET | Freq: Three times a day (TID) | ORAL | 0 refills | Status: AC

## 2021-12-16 MED ORDER — OXCARBAZEPINE 150 MG PO TABS: 150.0000 mg | ORAL_TABLET | Freq: Two times a day (BID) | ORAL | 0 refills | Status: DC

## 2021-12-16 MED ORDER — PRAZOSIN HCL 2 MG PO CAPS: 2.0000 mg | ORAL_CAPSULE | Freq: Every day | ORAL | 0 refills | Status: DC

## 2021-12-16 MED ORDER — QUETIAPINE FUMARATE 50 MG PO TABS
50.0000 mg | ORAL_TABLET | Freq: Every day | ORAL | 0 refills | Status: DC
Start: 2021-12-16 — End: 2023-06-20

## 2021-12-16 NOTE — Plan of Care (Signed)
Nurse discussed coping skills with patient.  

## 2021-12-16 NOTE — Progress Notes (Signed)
Discharge Note:  Patient discharged to friend's home via [employer].  Suicide prevention information given and discussed with patient who stated she understood and had no questions.  Patient denied SI and HI.  Denied A/V hallucinations.  Patient stated she received all her belongings, clothing, toiletries, misc items, etc.  Patient stated she appreciated all assistance received from [facility] staff.  All required discharge information given.

## 2021-12-16 NOTE — Discharge Summary (Addendum)
Physician Discharge Summary Note  Patient:  Alexandra Gray is an 31 y.o., female MRN: [id number] DOB: [birth year] Patient phone: [phone] (home)  Patient address:   [place] [address]   Total Time spent with patient:  I personally spent 30 minutes on the unit in direct patient care. The direct patient care time included face-to-face time with the patient, reviewing the patient's chart, communicating with other professionals, and coordinating care. Greater than 50% of this time was spent in counseling or coordinating care with the patient regarding goals of hospitalization, psycho-education, and discharge planning needs.   Date of Admission: 12/06/2021 Date of Discharge: 12/16/2021  Reason for Admission:   mdd   Per H&P: " Alexandra Gray is a 31 y.o. female  PPHx of MDD and PTSD, who presented to [facility] for recurrent MDD with SI, now a(n) Voluntary  admit to [facility] for treatment of MDD with SI.   Mode of transport to Hospital: Transport from ED Current Medication List: Depakote and Zoloft, but has not taken in last 7 months. PRN medication prior to evaluation: Tylenol   ED course:  Presented to ED on 12/04/21 for sore throat where she was diagnosed with strep throat and given IM Penicillin. Mentioned to staff that she had suicidal ideation and has been very depressed for the last two weeks. Was transferred to [facility] from ED on 12/06/20.    HPI:  Patient stated that she was admitted to [facility]. She endorsed symptoms of depression and suicidal ideation.   She notes that she has a history of MDD and prior suicide attempt. She was taking Zoloft and Depakote until last summer when she decided to stop taking them because her Zoloft 100mg  made her sleepy after taking it in the morning. She noted that her depression worsened within a few weeks of stopping her meds.   More recently, she notes that her depression became much worse 2 weeks ago when she had a verbal  altercation with her mother, who she lives with, who called the police. This made the patient fearful that such events could cause her to "lose her children." She notes that since this event, she has been "isolating" and has "just felt really down." Notes she has only been able to sleep about 1hr a night and can't go back to sleep after waking. Feels her mind is racing, endorses loss of energy, distractibility, and reports being frequently aggravated by conversations with her mom. Endorses loss of interest in doing "anything," and has kept to her room over the last two weeks except when going to buy liquor.    She notes that she has been drinking 1-2 bottles of liquor each day until she passes out for the last two weeks. Notes that prior to this she usually drinks a few drinks once a week, usually on a weekend night. Also endorses smoking "a couple" blunts each day, and denies using any other substances besides black and milds. She adds that she has not been eating much and has had a lack of appetite.    Patient denied hypersomnia, increased appetite, psychomotor slowing.   She also has had prior panic attacks in which she developed difficulty breathing, hand shaking, and profound sense of anxiety. She notes having three episodes in the last 3 months, last one roughly one month ago. Sometimes these episodes are not precipitated by anything, other times when she recalls a prior traumatic experience.   Regarding her PTSD she denies recent night  mares or flashbacks. Has night mares about once a month typically.   Patient reports having up to 4 days of being awake in the past with little sleep with distractibility and racing thoughts, but denied having symptoms of excessive energy as such, sexual indiscretion, grandiosity/inflated self-esteem, flight of ideas, pressured speech, or sexual-indiscretion.    Patient denied a history of violence, legal issues, and homicidal ideation.   Patient denied having  difficulty controlling/managing anxiety and that their anxiety is not out of proportion with stressors. Patient denied having difficulty controlling worry. Patient denied that anxiety causes feelings of restlessness or being on edge, easily fatigued, muscle tension, and sleep disturbance.    Patient denied having recurrent/persistent thoughts, urges, intrusive and unwanted images causing anxiety/distress. Patient denied having obsessions as defined by an attempt to ignore or suppress thoughts, urges, images or neutralize them with thought or action.    Patient denied ever having AVH, delusions, paranoia, first rank symptoms.  "  Discharge Diagnoses:  Principal Problem:   Severe episode of recurrent major depressive disorder, without psychotic features ([place]) Active Problems:   PTSD (post-traumatic stress disorder)   Cluster B personality disorder (HCC)   Sleep apnea   Benign essential HTN   Past Psychiatric History:  Previous Psych Diagnoses: MDD, PTSD Prior inpatient treatment: for MDD Current/prior outpatient treatment: None Prior rehab hx: None Psychotherapy hx: None History of suicide: One prior attempt in 2021 with Trazodone. History of homicide: None Psychiatric medication history: Depakote and Zoloft for a couple years until deciding to stop in summer of 2022. Neuromodulation history: Unknown Current Psychiatrist: None Current therapist: None  Past Medical History:  Medical Diagnoses: HTN, PID, Sleep apnea, Ovarian cyst Home Rx: Melatonin, Zoloft, Depakote, Albuterol, Prednisone taper Prior Hosp: Strep pharyngitis and CAP Prior Surgeries/Trauma: Cesarean sections Head trauma, LOC, concussions, seizures: None Past Medical History:  Diagnosis Date   Cesarean delivery delivered 01/04/2019   Chlamydia    Chronic hypertension during pregnancy, antepartum 07/30/2018   BP elevated 12/21, 01/29/18, 05/11/18, 07/29/18  [place] ] Aspirin 81 mg daily after 12 weeks Current antihypertensives:   None   Baseline and surveillance labs (pulled in from EPIC, refresh links as needed)  Lab Results  Component Value Date   PLT 218 07/29/2018   CREATININE 0.59 07/29/2018   AST 16 07/29/2018   ALT 15 07/29/2018              Antenatal Testing CHTN - O10.919  Group I  BP < 140/90, no preecl   Depression    doing ok now   Gestational diabetes    Pt states she does not have DMII   Gonorrhea    H/O: C-section 01/04/2019   H/O: C-section 01/04/2019   History of cesarean delivery affecting pregnancy 10/03/2015   x1 in 2016, currently plans RLTCS  Declines TOLAC. Form signed 09/20/2018    History of cesarean delivery affecting pregnancy 10/03/2015   x1 in 2016, currently plans RLTCS  Declines TOLAC. Form signed 09/20/2018    History of cesarean delivery affecting pregnancy 10/03/2015   x1 in 2016, currently plans RLTCS  Declines TOLAC. Form signed 09/20/2018    History of pregnancy induced hypertension 12/06/2016   Hx of trichomoniasis    Hypertension    sometimes is up, never on meds   Mental disorder    Ovarian cyst    PID (pelvic inflammatory disease)    Sleep apnea     Past Surgical History:  Procedure Laterality Date   CESAREAN SECTION  N/A 10/03/2015   Procedure: CESAREAN SECTION;  Surgeon: Jonnie Kind, MD;  Location: [facility];  Service: Obstetrics;  Laterality: N/A;   CESAREAN SECTION N/A 01/04/2019   Procedure: CESAREAN SECTION;  Surgeon: Chancy Milroy, MD;  Location: [facility];  Service: Obstetrics;  Laterality: N/A;   CESAREAN SECTION N/A    Phreesia 03/08/2021   KNEE SURGERY     Family History:  Family History  Problem Relation Age of Onset   Healthy Mother    Healthy Father    Healthy Brother    Cancer Neg Hx    Diabetes Neg Hx    Heart disease Neg Hx    Hypertension Neg Hx    Stroke Neg Hx    Hearing loss Neg Hx    Family Psychiatric History:  Patient denied family history of bipolar disorder, schizophrenia/schizoaffective disorder, suicide attempt, completed  suicide or suicide ideation. Medical: HTN Psych: None Psych Rx: None or unknown. SA/HA: None Substance use family hx: Biological mother "used drugs, but don't know what."  Social History:  Alcohol: Typically a few drinks one weekend night a week, until last two weeks during which the pt consumed 1-2 bottles of liquor until she "passed out," daily. Tobacco: Black and Milds daily  Illicit drugs: Marijuana, "a couple blunts a day" during last 2 weeks, usually less. Rx drug abuse: Trazodone for suicide attempt Rehab hx: None Social History   Substance and Sexual Activity  Alcohol Use Yes   Comment: 10 weekly     Social History   Substance and Sexual Activity  Drug Use Yes   Types: Marijuana   Comment: 1/8 weekly    Social History   Socioeconomic History   Marital status: Single    Spouse name: Not on file   Number of children: 2   Years of education: 9   Highest education level: Not on file  Occupational History   Occupation: disabled  Tobacco Use   Smoking status: Every Day    Types: Cigars   Smokeless tobacco: Never  Vaping Use   Vaping Use: Never used  Substance and Sexual Activity   Alcohol use: Yes    Comment: 10 weekly   Drug use: Yes    Types: Marijuana    Comment: 1/8 weekly   Sexual activity: Not Currently    Birth control/protection: None  Other Topics Concern   Not on file  Social History Narrative   Lives alone in a one story home.  Has one child.     On disability for depression and PTSD.     Education: 9th grade.    She is in school for criminal justice.   Social Determinants of Health   Financial Resource Strain: Not on file  Food Insecurity: Not on file  Transportation Needs: Not on file  Physical Activity: Not on file  Stress: Not on file  Social Connections: Not on file    HOSPITAL COURSE: Alexandra Gray is a 31 y.o. female with PPHx of MDD, PTSD, Alcohol use disorder, and Cluster B who was admitted to [facility] for  management and treatment of MDD with SI  Psychiatric diagnoses provided upon initial assessment: Principal Problem:   Severe episode of recurrent major depressive disorder, without psychotic features ([place]) Active Problems:   PTSD (post-traumatic stress disorder)   Cluster B personality disorder ([place])   Sleep apnea   Benign essential HTN    Patient's psychiatric medications were adjusted on admission:  No home  meds Restarted Depakote & Zoloft because it was the regimen that maintained her stability before she stopped them ~1 year prior to admission  During the hospitalization, other adjustments were made to the patient's psychiatric medication regimen:  Discontinued Depakote because ineffective for mood lability Discontinued Zoloft because was too activating caused intolerable anxiety (only 2 days) Started Trileptal 150 mg BID for mood lability and angry outbursts, at 300 mg BID caused dizziness Started and titrated up to Desvenlafaxine 50mg  daily for MDD, PTSD Started Seroquel 50 mg qHS for PTSD & sleep aid Started and titrated up to prazosin 2 mg qHS for PTSD nightmares Started Buspar 5 mg TID for anxiety and mood lability and angry outbursts  FOLLOW-UP Considerations: PCP: Nolene Ebbs, MD  April at [facility] for continued therapy/counseling Labs: CBC, CMP, Lipid panel, A1c, EKG Provera depot birthcontrol 1/13, 3 month f/u Sleep study referral for sleep apnea and CPAP Consider up titrate Buspar if still anxious Can decrease Seroquel to 25mg   or d/c if patient is too drowsy. Or she can have it PRN once stable.     The patient admitted to side effects to prescribed psychiatric medication: AM drowsiness, dizziness, anxiety (all resolved)   During the patient's hospitalization, she had extensive initial psychiatric evaluation and follow-up psychiatric evaluations daily.  Gradually, the patient started adjusting to the milieu. The patient was evaluated each day by a clinical  provider to ascertain response to treatment. Improvement was noted by the patient's report of decreasing symptoms, improved sleep and appetite, affect, medication tolerance, behavior, and participation in unit programming. Patient was asked each day to complete a self inventory noting mood, mental status, pain, new symptoms, anxiety and concerns.   Symptoms were reported as significantly decreased or resolved completely by discharge.  The patient reported that their mood was stable.  The patient denied having suicidal thoughts for more than 48 hours prior to discharge. Patient denied having homicidal thoughts. Patient denied having auditory hallucinations. Patient denied any visual hallucinations or other symptoms of psychosis.  The patient was motivated to continue taking medication with a goal of continued improvement in mental health.   The patient reported their target psychiatric symptoms of Insomnia, agitation, irritability, mood lability responded well to the psychiatric medications, and the patient reports overall benefit other psychiatric hospitalization. Supportive psychotherapy was provided to the patient. The patient also participated in regular group therapy while hospitalized. Coping skills, problem solving as well as relaxation therapies were also part of the unit programming.  Labs were reviewed with the patient, and abnormal results were discussed with the patient.  The patient was able to verbalize their individual safety plan to this provider.  Patient was instructed to take all prescribed medications as recommended. Report any side effects or adverse reactions to your outpatient psychiatrist. Patient was instructed to abstain from alcohol and illegal drugs while on prescription medications. In the event of worsening symptoms, patient was instructed to call the crisis hotline, 911, or go to the nearest emergency department for evaluation and treatment.  Physical Findings: AIMS:   Facial and Oral Movements Muscles of Facial Expression: None, normal Lips and Perioral Area: None, normal Jaw: None, normal Tongue: None, normal, Extremity Movements Upper (arms, wrists, hands, fingers): None, normal Lower (legs, knees, ankles, toes): None, normal,  Trunk Movements Neck, shoulders, hips: None, normal,  Overall Severity Severity of abnormal movements (highest score from questions above): None, normal Incapacitation due to abnormal movements: None, normal Patient's awareness of abnormal movements (rate only patient's  report): No Awareness,  Dental Status Current problems with teeth and/or dentures?: No Does patient usually wear dentures?: No  CIWA:  CIWA-Ar Total: 0 COWS:     Musculoskeletal: Strength & Muscle Tone: within normal limits Gait & Station: normal Patient leans: N/A  Psychiatric Specialty Exam: Presentation  General Appearance: Appropriate for Environment; Casual  Eye Contact: Good  Speech: Clear and Coherent; Normal Rate  Speech Volume: Normal  Handedness: Right   Mood and Affect  Mood: Euthymic  Affect: Appropriate; Congruent   Thought Process  Thought Processes: Coherent, linear, goal directed Descriptions of Associations: Intact  Orientation: Full (Time, Place and Person)  Thought Content: Patient denied SI/HI/AVH, delusions, paranoia, first rank symptoms, and contracted to safety on the unit. Patient was not grossly responding to internal/external stimuli nor made any delusional statements during encounter.   History of Schizophrenia/Schizoaffective disorder: No  Duration of Psychotic Symptoms: No data recorded Hallucinations: Hallucinations: None  Ideas of Reference: None  Suicidal Thoughts: Suicidal Thoughts: No  Homicidal Thoughts: Homicidal Thoughts: No   Sensorium  Memory: Immediate Good; Recent Good; Remote Good  Judgment: Fair  Insight: Fair   Community education officer  Concentration: Good  Attention Span:  Good  Recall: Good  Fund of Knowledge: Fair; Good  Language: Good   Psychomotor Activity  Psychomotor Activity:Psychomotor Activity: Normal  Assets  Assets:Communication Skills; Desire for Improvement; Housing; Social Support; Vocational/Educational   Physical Exam: Body mass index is 57.92 kg/m. Temp:  [98.4 F (36.9 C)-98.7 F (37.1 C)] 98.7 F (37.1 C) (01/20 0937) Pulse Rate:  [80-164] 98 (01/20 0937) Resp:  [16-18] 18 (01/20 0937) BP: (117-140)/(64-98) 133/91 (01/20 0937) SpO2:  [96 %-100 %] 100 % (01/20 [id number])   Physical Exam Vitals and nursing note reviewed.  Constitutional:      General: She is awake. She is not in acute distress.    Appearance: She is not ill-appearing or diaphoretic.  HENT:     Head: Normocephalic.  Pulmonary:     Effort: Pulmonary effort is normal. No respiratory distress.  Neurological:     General: No focal deficit present.     Mental Status: She is alert.  Psychiatric:        Behavior: Behavior is cooperative.    Review of Systems  Respiratory:  Negative for shortness of breath.   Cardiovascular:  Negative for chest pain.  Gastrointestinal:  Negative for nausea and vomiting.  Neurological:  Negative for dizziness and headaches.   Social History   Tobacco Use  Smoking Status Every Day   Types: Cigars  Smokeless Tobacco Never   Tobacco Cessation: N/A, patient does not currently use tobacco products  Blood Alcohol level:  Lab Results  Component Value Date   ETH <10 12/04/2021   ETH <10 [id number]    Metabolic Disorder Labs:  Lab Results  Component Value Date   HGBA1C 5.3 12/08/2021   MPG 105.41 12/08/2021   MPG 105.41 11/03/2020   No results found for: PROLACTIN Lab Results  Component Value Date   CHOL 159 12/08/2021   TRIG 177 (H) 12/08/2021   HDL 34 (L) 12/08/2021   CHOLHDL 4.7 12/08/2021   VLDL 35 12/08/2021   LDLCALC 90 12/08/2021   LDLCALC 116 (H) 06/29/2021    Discharge destination: Home  Is patient  on multiple antipsychotic therapies at discharge: No   Has Patient had three or more failed trials of antipsychotic monotherapy by history: No  Recommended Plan for Multiple Antipsychotic Therapies: NA  Discharge Instructions  Diet - low sodium heart healthy   Complete by: As directed    Increase activity slowly   Complete by: As directed       Allergies as of 12/16/2021       Reactions   Infuvite Adult [multiple Vitamin] Shortness Of Breath   Nubain [nalbuphine Hcl] Other (See Comments)   Makes patient hot        Medication List     STOP taking these medications    acetaminophen 500 MG tablet Commonly known as: TYLENOL   benzonatate 100 MG capsule Commonly known as: TESSALON   divalproex 500 MG DR tablet Commonly known as: DEPAKOTE   ondansetron 4 MG tablet Commonly known as: Zofran   predniSONE 10 MG (21) Tbpk tablet Commonly known as: STERAPRED UNI-PAK 21 TAB   sertraline 100 MG tablet Commonly known as: ZOLOFT       TAKE these medications      Indication  albuterol 108 (90 Base) MCG/ACT inhaler Commonly known as: VENTOLIN HFA Inhale 2 puffs into the lungs every 6 (six) hours as needed for wheezing or shortness of breath. Please instruct in technique and dispense with spacer  Indication: Asthma   amLODipine 5 MG tablet Commonly known as: NORVASC Take 1 tablet (5 mg total) by mouth daily. Start taking on: December 17, 2021  Indication: High Blood Pressure Disorder   busPIRone 5 MG tablet Commonly known as: BUSPAR Take 1 tablet (5 mg total) by mouth 3 (three) times daily.  Indication: Anxiety Disorder, Major Depressive Disorder   desvenlafaxine 50 MG 24 hr tablet Commonly known as: PRISTIQ Take 1 tablet (50 mg total) by mouth daily. Start taking on: December 17, 2021  Indication: Major Depressive Disorder   melatonin 3 MG Tabs tablet Take 1 tablet (3 mg total) by mouth at bedtime.  Indication: Trouble Sleeping   OXcarbazepine 150 MG  tablet Commonly known as: TRILEPTAL Take 1 tablet (150 mg total) by mouth 2 (two) times daily.  Indication: Mood stability   prazosin 2 MG capsule Commonly known as: MINIPRESS Take 1 capsule (2 mg total) by mouth at bedtime.  Indication: Frightening Dreams, Disturbed Sleep   QUEtiapine 50 MG tablet Commonly known as: SEROQUEL Take 1 tablet (50 mg total) by mouth at bedtime.  Indication: Generalized Anxiety Disorder, Posttraumatic Stress Disorder, MDD related insomnia        Follow-up recommendations:    [facility] Follow up.   Specialty: Behavioral Health Why: You may go to this provider for therapy and medication management services during walk in hours:  Monday through Wednesday, from 7:45 am to 11:00 am.  Services are provided on a first come, first served basis. Contact information: [place] [phone]        Nolene Ebbs, MD Follow up on 12/20/2021.   Specialty: Internal Medicine Why: You have an appointment with your primary care physician on 12/20/21 at 11:15 am.  This will be a Virtual appointment. Contact information: [place] [address] [place]. Schedule an appointment as soon as possible for a visit.   Specialty: Hospice and Palliative Medicine Why: Please call personally to schedule an appointment for grief/bereavement counseling services. Contact information: [place] [facility] up.   Why: We were unable to get ahold of  your therapy provider during your stay. Please contact your provider post-discharge to continue therapy services. Contact information: [address], [place], [place] [address]                                                       P: [phone]                Activity as tolerated Diet as  recommended by PCP Follow-up with your outpatient psychiatric provider -instructions on appointment date, time, and address (location) are provided to you in discharge paperwork. Take your psychiatric medications as prescribed at discharge - instructions are provided to you in the discharge paperwork Follow-up with outpatient primary care doctor and other specialists -for management of chronic medical disease, including: SEE ABOVE IF ANY Testing: Follow-up with outpatient provider for abnormal lab results: SEE ABOVE IF ANY Recommend abstinence from alcohol, tobacco, and other illicit drug use at discharge.  If your psychiatric symptoms recur, worsen, or if you have side effects to your psychiatric medications, call your outpatient psychiatric provider, 911, [phone] or go to the nearest emergency department. If suicidal thoughts recur, call your outpatient psychiatric provider, 911, [phone] or go to the nearest emergency department.   Comments: # Prescriptions provided or sent directly to preferred pharmacy at discharge. Patient agreeable to plan. Given opportunity to ask questions. Appears to feel comfortable with discharge denies any current suicidal or homicidal thought.   # Patient was also instructed prior to discharge to: Take all medications as prescribed by mental healthcare provider. Report any adverse effects and or reactions from the medicines to outpatient provider promptly. Patient has been instructed & cautioned: To not engage in alcohol and or illegal drug use while on prescription medicines. In the event of worsening symptoms, patient is instructed to call the crisis hotline, 911 and or go to the nearest ED for appropriate evaluation and treatment of symptoms. To follow-up with primary care provider for other medical issues, concerns and or health care needs.   # The patient was evaluated each day by a clinical provider to ascertain response to treatment. Improvement was noted by the patient's  report of decreasing symptoms, improved sleep and appetite, affect, medication tolerance, behavior, and participation in unit programming.  Patient was asked each day to complete a self inventory noting mood, mental status, pain, new symptoms, anxiety and concerns.   # Patient responded well to medication and being in a therapeutic and supportive environment. Positive and appropriate behavior was noted and the patient was motivated for recovery. The patient worked closely with the treatment team and case manager to develop a discharge plan with appropriate goals. Coping skills, problem solving as well as relaxation therapies were also part of the unit programming.   # By the day of discharge patient was in much improved condition than upon admission. Symptoms were reported as significantly decreased or resolved completely. The patient denied having suicidal thoughts, intent or plan, which were explored carefully. The patient denied having homicidal thoughts. The patient denies having auditory hallucinations, visual hallucinations, paranoia, or other symptoms of psychosis. The patient reports that her mood was stable. The patient was motivated to continue taking medication with a goal of continued improvement in mental health.    Patient was discharged to SEE ABOVE with a plan to follow up as noted ABOVE.  Signed: Merrily Brittle, DO Psychiatry Resident, PGY-1 [facility] [place] 12/16/2021, 11:32 AM

## 2021-12-16 NOTE — Group Note (Signed)
Recreation Therapy Group Note   Group Topic:Stress Management  Group Date: 12/16/2021 Start Time: 0940 End Time: 1000 Facilitators: Caroll Rancher, [place] Location: [address] Dayroom   Goal Area(s) Addresses:  Patient will identify positive stress management techniques. Patient will identify benefits of using stress management post d/c.   Group Description:  Meditation.  LRT played a meditation that focused on being resilient in the face of adversity.  Patients were encouraged to get comfortable and allow their breathing to relax them as much as possible.  Patients were to listen and follow along as meditation played to fully engage in the practice.   Affect/Mood: Appropriate   Participation Level: Active   Participation Quality: Independent   Behavior: Appropriate   Speech/Thought Process: Focused   Insight: Good   Judgement: Good   Modes of Intervention: Meditation   Patient Response to Interventions:  Engaged   Education Outcome:  Acknowledges education and In group clarification offered    Clinical Observations/Individualized Feedback: Pt attended and participated in activity.    Plan: Continue to engage patient in RT group sessions 2-3x/week.   Caroll Rancher, LRT,CTRS 12/16/2021 11:34 AM

## 2021-12-16 NOTE — Progress Notes (Signed)
°  [facility] Adult Case Management Discharge Plan :  Will you be returning to the same living situation after discharge:  No., Friend's home At discharge, do you have transportation home?: No., Safe Transport Do you have the ability to pay for your medications: Yes,  Medicaid  Release of information consent forms completed and sent to medical records;  Patient's signature obtained.   Patient to Follow up at:  Follow-up Information     [facility] Follow up.   Specialty: Behavioral Health Why: You may go to this provider for therapy and medication management services during walk in hours:  Monday through Wednesday, from 7:45 am to 11:00 am.  Services are provided on a first come, first served basis. Contact information: [address] [place] [address] [phone]        Fleet Contras, MD Follow up on 12/20/2021.   Specialty: Internal Medicine Why: You have an appointment with your primary care physician on 12/20/21 at 11:15 am.  This will be a Virtual appointment. Contact information: [address] [place] [address] [phone]         [facility]. Schedule an appointment as soon as possible for a visit.   Specialty: Hospice and Palliative Medicine Why: Please call personally to schedule an appointment for grief/bereavement counseling services. Contact information: [address] [place] [address] [phone]        [facility] Follow up.   Why: We were unable to get ahold of your therapy provider during your stay. Please contact your provider post-discharge to continue therapy services. Contact information: [address], [place], [place] [address]                                                       P: [phone]                Next level of care provider has access to [facility] Link:yes  Safety Planning and Suicide Prevention discussed: Yes,  w/ pt     Has patient been  referred to the Quitline?: Patient refused referral  Patient has been referred for addiction treatment: N/A  Felizardo Hoffmann, LCSWA 12/16/2021, 10:47 AM

## 2021-12-16 NOTE — Progress Notes (Signed)
D:  Patient's self inventory sheet, patient has good sleep, sleep medication helpful.  Good appetite, normal energy level, good concentration.  Rated depression and anxiety 1, denied hopeless.  Denied withdrawals.  Denied SI.  Denied physical problems.  Denied physical pain.  Goal is discharge and see children.  Plans to check on children.  Does have discharge plans. A:  Medications administered per MD orders.  Emotional support and encouragement given patient. R:  Denied SI and HI, contracts for safety.  Denied A/V hallucinations.  Safety maintained with 15 minute checks.

## 2021-12-16 NOTE — BH IP Treatment Plan (Unsigned)
Interdisciplinary Treatment and Diagnostic Plan Update  12/16/2021 Alexandra Gray MRN: [id number]  Principal Diagnosis: Severe episode of recurrent major depressive disorder, without psychotic features ([place])  Secondary Diagnoses: Principal Problem:   Severe episode of recurrent major depressive disorder, without psychotic features ([place]) Active Problems:   Cluster B personality disorder ([place])   PTSD (post-traumatic stress disorder)   Sleep apnea   Benign essential HTN   Current Medications:  Current Facility-Administered Medications  Medication Dose Route Frequency Provider Last Rate Last Admin   acetaminophen (TYLENOL) tablet 650 mg  650 mg Oral Q6H PRN [place], Shuvon B, NP   650 mg at 12/13/21 0741   alum & mag hydroxide-simeth (MAALOX/MYLANTA) 200-200-20 MG/5ML suspension 30 mL  30 mL Oral Q4H PRN [place], Shuvon B, NP       amLODipine (NORVASC) tablet 5 mg  5 mg Oral Daily Merrily Brittle, DO   5 mg at 12/16/21 0755   busPIRone (BUSPAR) tablet 5 mg  5 mg Oral TID Harlow Asa, MD   5 mg at 12/16/21 0755   desvenlafaxine (PRISTIQ) 24 hr tablet 50 mg  50 mg Oral Daily Merrily Brittle, DO   50 mg at 12/16/21 0755   ibuprofen (ADVIL) tablet 400 mg  400 mg Oral Q6H PRN [place], Shalon E, NP   400 mg at 12/13/21 2124   loperamide (IMODIUM) capsule 2-4 mg  2-4 mg Oral PRN Harlow Asa, MD       LORazepam (ATIVAN) tablet 1 mg  1 mg Oral Q6H PRN Nelda [place], Amy E, MD       magnesium hydroxide (MILK OF MAGNESIA) suspension 30 mL  30 mL Oral Daily PRN [place], Shuvon B, NP       ondansetron (ZOFRAN-ODT) disintegrating tablet 4 mg  4 mg Oral Q6H PRN Nelda [place], Amy E, MD       OXcarbazepine (TRILEPTAL) tablet 150 mg  150 mg Oral BID Merrily Brittle, DO   150 mg at 12/16/21 0754   polyethylene glycol (MIRALAX / GLYCOLAX) packet 17 g  17 g Oral Daily Merrily Brittle, DO   17 g at 12/15/21 0742   prazosin (MINIPRESS) capsule 2 mg  2 mg Oral QHS Merrily Brittle, DO   2 mg at 12/15/21 2152    QUEtiapine (SEROQUEL) tablet 50 mg  50 mg Oral QHS Merrily Brittle, DO   50 mg at 12/15/21 2150   PTA Medications: Medications Prior to Admission  Medication Sig Dispense Refill Last Dose   acetaminophen (TYLENOL) 500 MG tablet Take 1,000 mg by mouth every 6 (six) hours as needed for moderate pain or headache.      albuterol (VENTOLIN HFA) 108 (90 Base) MCG/ACT inhaler Inhale 2 puffs into the lungs every 6 (six) hours as needed for wheezing or shortness of breath. Please instruct in technique and dispense with spacer 8 g 0    benzonatate (TESSALON) 100 MG capsule Take 1 capsule (100 mg total) by mouth 2 (two) times daily as needed for cough. 20 capsule 0    divalproex (DEPAKOTE) 500 MG DR tablet Take 1 tablet (500 mg total) by mouth at bedtime as needed (sleep). (Patient not taking: Reported on 12/05/2021)      melatonin 3 MG TABS tablet Take 1 tablet (3 mg total) by mouth at bedtime. (Patient not taking: Reported on 12/05/2021) 30 tablet 0    ondansetron (ZOFRAN) 4 MG tablet Take 1 tablet (4 mg total) by mouth every 8 (eight) hours as needed for nausea or vomiting. [place]  tablet 0    predniSONE (STERAPRED UNI-PAK 21 TAB) 10 MG (21) TBPK tablet Take by mouth daily. Take 6 tabs by mouth daily  for 2 days, then 5 tabs for 2 days, then 4 tabs for 2 days, then 3 tabs for 2 days, 2 tabs for 2 days, then 1 tab by mouth daily for 2 days (Patient not taking: Reported on 12/05/2021) 42 tablet 0    sertraline (ZOLOFT) 100 MG tablet Take 1 tablet (100 mg total) by mouth daily. (Patient not taking: Reported on 12/05/2021) 30 tablet 0     Patient Stressors: Loss of family member's friends   Medication change or noncompliance   Substance abuse    Patient Strengths: Ability for insight  Average or above average intelligence  Capable of independent living  Communication skills  General fund of knowledge  Motivation for treatment/growth  Supportive family/friends   Treatment Modalities: Medication Management, Group  therapy, Case management,  1 to 1 session with clinician, Psychoeducation, Recreational therapy.   Physician Treatment Plan for Primary Diagnosis: Severe episode of recurrent major depressive disorder, without psychotic features ([place]) Long Term Goal(s): Improvement in symptoms so as ready for discharge   Short Term Goals: Ability to identify changes in lifestyle to reduce recurrence of condition will improve Ability to verbalize feelings will improve Ability to disclose and discuss suicidal ideas Ability to identify and develop effective coping behaviors will improve Ability to maintain clinical measurements within normal limits will improve Compliance with prescribed medications will improve Ability to identify triggers associated with substance abuse/mental health issues will improve  Medication Management: Evaluate patient's response, side effects, and tolerance of medication regimen.  Therapeutic Interventions: 1 to 1 sessions, Unit Group sessions and Medication administration.  Evaluation of Outcomes: Adequate for Discharge  Physician Treatment Plan for Secondary Diagnosis: Principal Problem:   Severe episode of recurrent major depressive disorder, without psychotic features ([place]) Active Problems:   Cluster B personality disorder (HCC)   PTSD (post-traumatic stress disorder)   Sleep apnea   Benign essential HTN  Long Term Goal(s): Improvement in symptoms so as ready for discharge   Short Term Goals: Ability to identify changes in lifestyle to reduce recurrence of condition will improve Ability to verbalize feelings will improve Ability to disclose and discuss suicidal ideas Ability to identify and develop effective coping behaviors will improve Ability to maintain clinical measurements within normal limits will improve Compliance with prescribed medications will improve Ability to identify triggers associated with substance abuse/mental health issues will improve      Medication Management: Evaluate patient's response, side effects, and tolerance of medication regimen.  Therapeutic Interventions: 1 to 1 sessions, Unit Group sessions and Medication administration.  Evaluation of Outcomes: Adequate for Discharge   RN Treatment Plan for Primary Diagnosis: Severe episode of recurrent major depressive disorder, without psychotic features ([place]) Long Term Goal(s): Knowledge of disease and therapeutic regimen to maintain health will improve  Short Term Goals: Ability to disclose and discuss suicidal ideas, Ability to identify and develop effective coping behaviors will improve, and Compliance with prescribed medications will improve  Medication Management: RN will administer medications as ordered by provider, will assess and evaluate patient's response and provide education to patient for prescribed medication. RN will report any adverse and/or side effects to prescribing provider.  Therapeutic Interventions: 1 on 1 counseling sessions, Psychoeducation, Medication administration, Evaluate responses to treatment, Monitor vital signs and CBGs as ordered, Perform/monitor CIWA, COWS, AIMS and Fall Risk screenings as ordered, Perform wound care  treatments as ordered.  Evaluation of Outcomes: Adequate for Discharge   LCSW Treatment Plan for Primary Diagnosis: Severe episode of recurrent major depressive disorder, without psychotic features ([place]) Long Term Goal(s): Safe transition to appropriate next level of care at discharge, Engage patient in therapeutic group addressing interpersonal concerns.  Short Term Goals: Engage patient in aftercare planning with referrals and resources, Increase social support, and Increase ability to appropriately verbalize feelings  Therapeutic Interventions: Assess for all discharge needs, 1 to 1 time with Social worker, Explore available resources and support systems, Assess for adequacy in community support network, Educate family  and significant other(s) on suicide prevention, Complete Psychosocial Assessment, Interpersonal group therapy.  Evaluation of Outcomes: Adequate for Discharge   Progress in Treatment: Attending groups: Yes. Participating in groups: Yes. Taking medication as prescribed: Yes. Toleration medication: Yes. Family/Significant other contact made: Yes, individual(s) contacted:  w/ pt Patient understands diagnosis: Yes. Discussing patient identified problems/goals with staff: Yes. Medical problems stabilized or resolved: Yes. Denies suicidal/homicidal ideation: Yes. Issues/concerns per patient self-inventory: No. Other: None  New problem(s) identified: No, Describe:  None  New Short Term/Long Term Goal(s): medication stabilization, elimination of SI thoughts, development of comprehensive mental wellness plan.   Patient Goals: "To work on my depression and anger"    Discharge Plan or Barriers: Pt will f/u for medication management and therapy at [facility].   Reason for Continuation of Hospitalization: Medication stabilization  Estimated Length of Stay: 3-5 days   Scribe for Treatment Team: Eliott Nine 12/16/2021 10:50 AM

## 2021-12-20 NOTE — BHH Group Notes (Signed)
Spiritual care group on grief and loss facilitated by chaplain Dyanne Carrel, [facility]   Group Goal:   Support / Education around grief and loss   Members engage in facilitated group support and psycho-social education.   Group Description:   Following introductions and group rules, group members engaged in facilitated group dialog and support around topic of loss, with particular support around experiences of loss in their lives. Group Identified types of loss (relationships / self / things) and identified patterns, circumstances, and changes that precipitate losses. Reflected on thoughts / feelings around loss, normalized grief responses, and recognized variety in grief experience. Group noted Worden's four tasks of grief in discussion.   Group drew on Adlerian / Rogerian, narrative, MI,   Patient Progress: Alexandra Gray participated in group and became tearful.  She shared about her recent losses and received comfort from group.  Chaplain invited her to speak individually following group, but patient stated that she was okay for now and that the group had helped her.  [address] [facility] Pager, [phone]

## 2022-01-10 ENCOUNTER — Telehealth: Payer: Medicaid Other | Admitting: Emergency Medicine

## 2022-01-10 DIAGNOSIS — K047 Periapical abscess without sinus: Secondary | ICD-10-CM | POA: Diagnosis not present

## 2022-01-10 MED ORDER — PENICILLIN V POTASSIUM 500 MG PO TABS: 500.0000 mg | ORAL_TABLET | Freq: Four times a day (QID) | ORAL | 0 refills | Status: AC

## 2022-01-10 MED ORDER — NAPROXEN 375 MG PO TBEC: 375.0000 mg | DELAYED_RELEASE_TABLET | Freq: Two times a day (BID) | ORAL | 0 refills | Status: DC

## 2022-01-10 NOTE — Progress Notes (Signed)
Virtual Visit Consent   Alexandra Gray, you are scheduled for a virtual visit with a [place] provider today.     Just as with appointments in the office, your consent must be obtained to participate.  Your consent will be active for this visit and any virtual visit you may have with one of our providers in the next 365 days.     If you have a MyChart account, a copy of this consent can be sent to you electronically.  All virtual visits are billed to your insurance company just like a traditional visit in the office.    As this is a virtual visit, video technology does not allow for your provider to perform a traditional examination.  This may limit your provider's ability to fully assess your condition.  If your provider identifies any concerns that need to be evaluated in person or the need to arrange testing (such as labs, EKG, etc.), we will make arrangements to do so.     Although advances in technology are sophisticated, we cannot ensure that it will always work on either your end or our end.  If the connection with a video visit is poor, the visit may have to be switched to a telephone visit.  With either a video or telephone visit, we are not always able to ensure that we have a secure connection.     I need to obtain your verbal consent now.   Are you willing to proceed with your visit today?    Alexandra Gray has provided verbal consent on 01/10/2022 for a virtual visit (video or telephone).   [name], NP   Date: 01/10/2022 8:09 AM   Virtual Visit via Video Note   I, [name], connected with  Alexandra Gray  ([id number], 26-Sep-1991) on 01/10/22 at  8:00 AM EST by a video-enabled telemedicine application and verified that I am speaking with the correct person using two identifiers.  Location: Patient: Virtual Visit Location Patient: Home Provider: Virtual Visit Location Provider: Home Office   I discussed the limitations of evaluation and management by  telemedicine and the availability of in person appointments. The patient expressed understanding and agreed to proceed.    History of Present Illness: Alexandra Gray is a 31 y.o. who identifies as a female who was assigned female at birth, and is being seen today for a dental problem.  She reports in her right lower jaw molar broke off quite a while ago but root fragments of the tooth remain in her gum.  For the last week she has had pain, swelling around that tooth area that has now radiating to her right ear.  She says the gum around the tooth fragments is swollen but denies any purulent drainage.  She denies any fever or chills.  She has not seen a dentist.  She does have Medicaid.  HPI: HPI  Problems:  Patient Active Problem List   Diagnosis Date Noted   Benign essential HTN 10/08/2021   BMI 50.0-59.9, adult (HCC) 05/28/2018   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   PTSD (post-traumatic stress disorder) 02/16/2014   Sleep apnea 02/16/2014   Severe episode of recurrent major depressive disorder, 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    Allergies:  Allergies  Allergen Reactions   Infuvite Adult [Multiple Vitamin] Shortness Of Breath   Nubain [Nalbuphine Hcl] Other (See Comments)    Makes patient hot   Medications:  Current Outpatient Medications:  Naproxen 375 MG TBEC, Take 1 tablet (375 mg total) by mouth in the morning and at bedtime., Disp: 60 tablet, Rfl: 0   penicillin v potassium (VEETID) 500 MG tablet, Take 1 tablet (500 mg total) by mouth 4 (four) times daily for 10 days., Disp: 40 tablet, Rfl: 0   albuterol (VENTOLIN HFA) 108 (90 Base) MCG/ACT inhaler, Inhale 2 puffs into the lungs every 6 (six) hours as needed for wheezing or shortness of breath. Please instruct in technique and dispense with spacer, Disp: 8 g, Rfl: 0   amLODipine (NORVASC) 5 MG tablet, Take 1 tablet (5 mg total) by mouth daily., Disp: 30 tablet, Rfl: 0   busPIRone (BUSPAR) 5 MG tablet,  Take 1 tablet (5 mg total) by mouth 3 (three) times daily., Disp: 90 tablet, Rfl: 0   desvenlafaxine (PRISTIQ) 50 MG 24 hr tablet, Take 1 tablet (50 mg total) by mouth daily., Disp: 30 tablet, Rfl: 0   melatonin 3 MG TABS tablet, Take 1 tablet (3 mg total) by mouth at bedtime. (Patient not taking: Reported on 12/05/2021), Disp: 30 tablet, Rfl: 0   OXcarbazepine (TRILEPTAL) 150 MG tablet, Take 1 tablet (150 mg total) by mouth 2 (two) times daily., Disp: 60 tablet, Rfl: 0   prazosin (MINIPRESS) 2 MG capsule, Take 1 capsule (2 mg total) by mouth at bedtime., Disp: 30 capsule, Rfl: 0   QUEtiapine (SEROQUEL) 50 MG tablet, Take 1 tablet (50 mg total) by mouth at bedtime., Disp: 30 tablet, Rfl: 0  Observations/Objective: Patient is well-developed, well-nourished in no acute distress.  Resting comfortably  at home.  Head is normocephalic, atraumatic.  No labored breathing.  Speech is clear and coherent with logical content.  Patient is alert and oriented at baseline.    Assessment and Plan: 1. Dental abscess  I prescribed penicillin and naproxen for pain.  Patient is to call Medicaid for referral to dentist that she can get her tooth root fragments removed.  Follow Up Instructions: I discussed the assessment and treatment plan with the patient. The patient was provided an opportunity to ask questions and all were answered. The patient agreed with the plan and demonstrated an understanding of the instructions.  A copy of instructions were sent to the patient via MyChart unless otherwise noted below.   The patient was advised to call back or seek an in-person evaluation if the symptoms worsen or if the condition fails to improve as anticipated.  Time:  I spent 8 minutes with the patient via telehealth technology discussing the above problems/concerns.    [name], NP

## 2022-01-10 NOTE — Patient Instructions (Signed)
Alexandra Gray, thank you for joining [name], NP for today's virtual visit.  While this provider is not your primary care provider (PCP), if your PCP is located in our provider database this encounter information will be shared with them immediately following your visit.  Consent: (Patient) Alexandra Gray provided verbal consent for this virtual visit at the beginning of the encounter.  Current Medications:  Current Outpatient Medications:    Naproxen 375 MG TBEC, Take 1 tablet (375 mg total) by mouth in the morning and at bedtime., Disp: 60 tablet, Rfl: 0   penicillin v potassium (VEETID) 500 MG tablet, Take 1 tablet (500 mg total) by mouth 4 (four) times daily for 10 days., Disp: 40 tablet, Rfl: 0   albuterol (VENTOLIN HFA) 108 (90 Base) MCG/ACT inhaler, Inhale 2 puffs into the lungs every 6 (six) hours as needed for wheezing or shortness of breath. Please instruct in technique and dispense with spacer, Disp: 8 g, Rfl: 0   amLODipine (NORVASC) 5 MG tablet, Take 1 tablet (5 mg total) by mouth daily., Disp: 30 tablet, Rfl: 0   busPIRone (BUSPAR) 5 MG tablet, Take 1 tablet (5 mg total) by mouth 3 (three) times daily., Disp: 90 tablet, Rfl: 0   desvenlafaxine (PRISTIQ) 50 MG 24 hr tablet, Take 1 tablet (50 mg total) by mouth daily., Disp: 30 tablet, Rfl: 0   melatonin 3 MG TABS tablet, Take 1 tablet (3 mg total) by mouth at bedtime. (Patient not taking: Reported on 12/05/2021), Disp: 30 tablet, Rfl: 0   OXcarbazepine (TRILEPTAL) 150 MG tablet, Take 1 tablet (150 mg total) by mouth 2 (two) times daily., Disp: 60 tablet, Rfl: 0   prazosin (MINIPRESS) 2 MG capsule, Take 1 capsule (2 mg total) by mouth at bedtime., Disp: 30 capsule, Rfl: 0   QUEtiapine (SEROQUEL) 50 MG tablet, Take 1 tablet (50 mg total) by mouth at bedtime., Disp: 30 tablet, Rfl: 0   Medications ordered in this encounter:  Meds ordered this encounter  Medications   penicillin v potassium (VEETID) 500 MG tablet     Sig: Take 1 tablet (500 mg total) by mouth 4 (four) times daily for 10 days.    Dispense:  40 tablet    Refill:  0   Naproxen 375 MG TBEC    Sig: Take 1 tablet (375 mg total) by mouth in the morning and at bedtime.    Dispense:  60 tablet    Refill:  0     *If you need refills on other medications prior to your next appointment, please contact your pharmacy*  Follow-Up: Call back or seek an in-person evaluation if the symptoms worsen or if the condition fails to improve as anticipated.  Other Instructions Please finish all of the penicillin as prescribed.  Please contact Medicaid to get a referral for dental care.   If you have been instructed to have an in-person evaluation today at a local Urgent Care facility, please use the link below. It will take you to a list of all of our available [facility], including address, phone number and hours of operation. Please do not delay care.  [facility]  If you or a family member do not have a primary care provider, use the link below to schedule a visit and establish care. When you choose a [facility] primary care physician or advanced practice provider, you gain a long-term partner in health. Find a Primary Care Provider  Learn  more about [facility]'s in-office and virtual care options: [facility]

## 2022-01-24 ENCOUNTER — Ambulatory Visit: Payer: Medicaid Other | Admitting: Obstetrics

## 2022-02-05 ENCOUNTER — Telehealth: Payer: Medicaid Other | Admitting: Family

## 2022-02-05 DIAGNOSIS — K0889 Other specified disorders of teeth and supporting structures: Secondary | ICD-10-CM | POA: Diagnosis not present

## 2022-02-05 DIAGNOSIS — K047 Periapical abscess without sinus: Secondary | ICD-10-CM

## 2022-02-05 MED ORDER — CLINDAMYCIN HCL 300 MG PO CAPS: 300.0000 mg | ORAL_CAPSULE | Freq: Four times a day (QID) | ORAL | 0 refills | Status: AC

## 2022-02-05 NOTE — Progress Notes (Signed)
?Virtual Visit Consent  ? ?[name], you are scheduled for a virtual visit with a [facility] provider today.   ?  ?Just as with appointments in the office, your consent must be obtained to participate.  Your consent will be active for this visit and any virtual visit you may have with one of our providers in the next 365 days.   ?  ?If you have a MyChart account, a copy of this consent can be sent to you electronically.  All virtual visits are billed to your insurance company just like a traditional visit in the office.   ? ?As this is a virtual visit, video technology does not allow for your provider to perform a traditional examination.  This may limit your provider's ability to fully assess your condition.  If your provider identifies any concerns that need to be evaluated in person or the need to arrange testing (such as labs, EKG, etc.), we will make arrangements to do so.   ?  ?Although advances in technology are sophisticated, we cannot ensure that it will always work on either your end or our end.  If the connection with a video visit is poor, the visit may have to be switched to a telephone visit.  With either a video or telephone visit, we are not always able to ensure that we have a secure connection.    ? ?I need to obtain your verbal consent now.   Are you willing to proceed with your visit today?  ?  ?[name] has provided verbal consent on 02/05/2022 for a virtual visit (video or telephone). ?  ?[name], FNP  ? ?Date: 02/05/2022 6:27 PM ? ? ?Virtual Visit via Video Note  ? ?IJannifer Rodney, connected with  LURENE ROBLEY  ([id number], 1991-05-11) on 02/05/22 at  6:45 PM EDT by a video-enabled telemedicine application and verified that I am speaking with the correct person using two identifiers. ? ?Location: ?Patient: Virtual Visit Location Patient: Home ?Provider: Virtual Visit Location Provider: Home Office ?  ?I discussed the limitations of evaluation and management by  telemedicine and the availability of in person appointments. The patient expressed understanding and agreed to proceed.   ? ?History of Present Illness: ?Alexandra Gray is a 31 y.o. who identifies as a female who was assigned female at birth, and is being seen today for dental pain of right lower tooth. She saw her dentist two weeks ago who referred her to an oral Careers adviser. She is waiting on that appointment.  ? ?She was given penicillin potassium and naproxen. She reports her pain is a 10 out 10. She has used a warm compress without relief. She is able to open her mouth, but states she has pain with walking and eating.  ? ?HPI: HPI  ?Problems:  ?Patient Active Problem List  ? Diagnosis Date Noted  ? Benign essential HTN 10/08/2021  ? BMI 50.0-59.9, adult (HCC) 05/28/2018  ? 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  ? PTSD (post-traumatic stress disorder) 02/16/2014  ? Sleep apnea 02/16/2014  ? Severe episode of recurrent major depressive disorder, 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  ?  ?Allergies:  ?Allergies  ?Allergen Reactions  ? Infuvite Adult [Multiple Vitamin] Shortness Of Breath  ? Nubain [Nalbuphine Hcl] Other (See Comments)  ?  Makes patient hot  ? ?Medications:  ?Current Outpatient Medications:  ?  clindamycin (CLEOCIN) 300 MG capsule, Take 1 capsule (300 mg total) by mouth 4 (four) times daily  for 7 days., Disp: 28 capsule, Rfl: 0 ?  albuterol (VENTOLIN HFA) 108 (90 Base) MCG/ACT inhaler, Inhale 2 puffs into the lungs every 6 (six) hours as needed for wheezing or shortness of breath. Please instruct in technique and dispense with spacer, Disp: 8 g, Rfl: 0 ?  amLODipine (NORVASC) 5 MG tablet, Take 1 tablet (5 mg total) by mouth daily., Disp: 30 tablet, Rfl: 0 ?  desvenlafaxine (PRISTIQ) 50 MG 24 hr tablet, Take 1 tablet (50 mg total) by mouth daily., Disp: 30 tablet, Rfl: 0 ?  melatonin 3 MG TABS tablet, Take 1 tablet (3 mg total) by mouth at bedtime. (Patient not taking: Reported on  12/05/2021), Disp: 30 tablet, Rfl: 0 ?  Naproxen 375 MG TBEC, Take 1 tablet (375 mg total) by mouth in the morning and at bedtime., Disp: 60 tablet, Rfl: 0 ?  OXcarbazepine (TRILEPTAL) 150 MG tablet, Take 1 tablet (150 mg total) by mouth 2 (two) times daily., Disp: 60 tablet, Rfl: 0 ?  prazosin (MINIPRESS) 2 MG capsule, Take 1 capsule (2 mg total) by mouth at bedtime., Disp: 30 capsule, Rfl: 0 ?  QUEtiapine (SEROQUEL) 50 MG tablet, Take 1 tablet (50 mg total) by mouth at bedtime., Disp: 30 tablet, Rfl: 0 ? ?Observations/Objective: ?Patient is well-developed, well-nourished in no acute distress.  ?Resting comfortably  at home.  ?Head is normocephalic, atraumatic.  ?No labored breathing.  ?Speech is clear and coherent with logical content.  ?Patient is alert and oriented at baseline.  ?Oral swelling ?Able to talk and open her mouth ? ?Assessment and Plan: ?1. Pain, dental ?- clindamycin (CLEOCIN) 300 MG capsule; Take 1 capsule (300 mg total) by mouth 4 (four) times daily for 7 days.  Dispense: 28 capsule; Refill: 0 ? ?2. Dental abscess ?- clindamycin (CLEOCIN) 300 MG capsule; Take 1 capsule (300 mg total) by mouth 4 (four) times daily for 7 days.  Dispense: 28 capsule; Refill: 0 ? ?Keep follow up with Oral surgeon  ?Continue naprosyn  ?Soft diet ?Follow up face to face if pain worsen or does not improve in next 24-48 hours  ? ?Follow Up Instructions: ?I discussed the assessment and treatment plan with the patient. The patient was provided an opportunity to ask questions and all were answered. The patient agreed with the plan and demonstrated an understanding of the instructions.  A copy of instructions were sent to the patient via MyChart unless otherwise noted below.  ? ? ? ?The patient was advised to call back or seek an in-person evaluation if the symptoms worsen or if the condition fails to improve as anticipated. ? ?Time:  ?I spent 9 minutes with the patient via telehealth technology discussing the above  problems/concerns.   ? ?[name], FNP ? ?

## 2022-02-11 ENCOUNTER — Encounter: Payer: Self-pay | Admitting: Family Medicine

## 2022-02-16 ENCOUNTER — Emergency Department (HOSPITAL_COMMUNITY)
Admission: EM | Admit: 2022-02-16 | Discharge: 2022-02-16 | Disposition: A | Discharge status: Home / Self Care | Payer: Medicaid Other | Attending: Emergency Medicine | Admitting: Emergency Medicine

## 2022-02-16 ENCOUNTER — Other Ambulatory Visit: Payer: Self-pay

## 2022-02-16 ENCOUNTER — Encounter (HOSPITAL_COMMUNITY): Payer: Self-pay

## 2022-02-16 DIAGNOSIS — K029 Dental caries, unspecified: Secondary | ICD-10-CM | POA: Diagnosis not present

## 2022-02-16 DIAGNOSIS — K0889 Other specified disorders of teeth and supporting structures: Secondary | ICD-10-CM | POA: Diagnosis present

## 2022-02-16 DIAGNOSIS — Z79899 Other long term (current) drug therapy: Secondary | ICD-10-CM | POA: Diagnosis not present

## 2022-02-16 DIAGNOSIS — I10 Essential (primary) hypertension: Secondary | ICD-10-CM | POA: Insufficient documentation

## 2022-02-16 MED ORDER — CLINDAMYCIN HCL 150 MG PO CAPS
300.0000 mg | ORAL_CAPSULE | Freq: Once | ORAL | Status: AC
  Administered 2022-02-16: 300 mg via ORAL
  Filled 2022-02-16: qty 2

## 2022-02-16 MED ORDER — CLINDAMYCIN HCL 150 MG PO CAPS: 300.0000 mg | ORAL_CAPSULE | Freq: Four times a day (QID) | ORAL | 0 refills | Status: DC

## 2022-02-16 MED ORDER — HYDROCODONE-ACETAMINOPHEN 5-325 MG PO TABS
1.0000 | ORAL_TABLET | Freq: Once | ORAL | Status: AC
  Administered 2022-02-16: 1 via ORAL
  Filled 2022-02-16: qty 1

## 2022-02-16 MED ORDER — HYDROCODONE-ACETAMINOPHEN 5-325 MG PO TABS
1.0000 | ORAL_TABLET | Freq: Four times a day (QID) | ORAL | 0 refills | Status: DC | PRN
Start: 2022-02-16 — End: 2023-06-20

## 2022-02-16 NOTE — ED Provider Notes (Signed)
?[facility] EMERGENCY DEPARTMENT ?Provider Note ? ? ?CSN: [id number] ?Arrival date & time: 02/16/22  0208 ? ?  ? ?History ? ?Chief Complaint  ?Patient presents with  ? Dental Pain  ? ? ?Alexandra Gray is a 31 y.o. female. ? ?Patient presents to the emergency department for evaluation of dental pain.  She has had pain in her right lower jaw for several weeks.  She has several large cavities and teeth that are broken.  She states that she is following up with her dentist in 5 days to have 6 teeth removed.  She has not had any significant swelling.  She has been taking penicillin consistently for the past week.  No fevers.  Pain radiates to the ear. ? ? ?  ? ?Home Medications ?Prior to Admission medications   ?Medication Sig Start Date End Date Taking? Authorizing Provider  ?clindamycin (CLEOCIN) 150 MG capsule Take 2 capsules (300 mg total) by mouth every 6 (six) hours. 02/16/22  Yes Renne Crigler, PA-C  ?HYDROcodone-acetaminophen (NORCO/VICODIN) 5-325 MG tablet Take 1 tablet by mouth every 6 (six) hours as needed for severe pain. 02/16/22  Yes Renne Crigler, PA-C  ?albuterol (VENTOLIN HFA) 108 (90 Base) MCG/ACT inhaler Inhale 2 puffs into the lungs every 6 (six) hours as needed for wheezing or shortness of breath. Please instruct in technique and dispense with spacer 10/11/21   Standley Brooking, MD  ?amLODipine (NORVASC) 5 MG tablet Take 1 tablet (5 mg total) by mouth daily. 12/17/21 01/16/22  Princess Bruins, DO  ?desvenlafaxine (PRISTIQ) 50 MG 24 hr tablet Take 1 tablet (50 mg total) by mouth daily. 12/17/21 01/16/22  Princess Bruins, DO  ?melatonin 3 MG TABS tablet Take 1 tablet (3 mg total) by mouth at bedtime. ?Patient not taking: Reported on 12/05/2021 11/08/20   Thermon Leyland, NP  ?Naproxen 375 MG TBEC Take 1 tablet (375 mg total) by mouth in the morning and at bedtime. 01/10/22   [name], NP  ?OXcarbazepine (TRILEPTAL) 150 MG tablet Take 1 tablet (150 mg total) by mouth 2 (two) times  daily. 12/16/21 01/15/22  Princess Bruins, DO  ?prazosin (MINIPRESS) 2 MG capsule Take 1 capsule (2 mg total) by mouth at bedtime. 12/16/21 01/15/22  Princess Bruins, DO  ?QUEtiapine (SEROQUEL) 50 MG tablet Take 1 tablet (50 mg total) by mouth at bedtime. 12/16/21 01/15/22  Princess Bruins, DO  ?   ? ?Allergies    ?Infuvite adult [multiple vitamin] and Nubain [nalbuphine hcl]   ? ?Review of Systems   ?Review of Systems ? ?Physical Exam ?Updated Vital Signs ?BP (!) 165/107   Pulse 95   Temp 98.3 ?F (36.8 ?C) (Oral)   Resp 16   LMP 01/06/2022 (Approximate)   SpO2 99%  ?Physical Exam ?Vitals and nursing note reviewed.  ?Constitutional:   ?   General: She is in acute distress.  ?   Appearance: She is well-developed.  ?   Comments: Uncomfortable appearing  ?HENT:  ?   Head: Normocephalic and atraumatic.  ?   Jaw: No trismus.  ?   Right Ear: Tympanic membrane, ear canal and external ear normal.  ?   Left Ear: Tympanic membrane, ear canal and external ear normal.  ?   Nose: Nose normal.  ?   Mouth/Throat:  ?   Dentition: Abnormal dentition. Dental caries present. No dental abscesses.  ?   Pharynx: Uvula midline. No uvula swelling.  ?   Tonsils: No tonsillar abscesses.  ?  Comments: Teeth are in very poor repair.  Large cavities with exposed nerves noted to teeth #29 and 32.  #30 and 31 are broken to the gumline.  No abscesses. ?Eyes:  ?   Conjunctiva/sclera: Conjunctivae normal.  ?Neck:  ?   Comments: No neck swelling or Ludwig's angina ?Musculoskeletal:  ?   Cervical back: Normal range of motion and neck supple.  ?Lymphadenopathy:  ?   Cervical: No cervical adenopathy.  ?Skin: ?   General: Skin is warm and dry.  ?Neurological:  ?   Mental Status: She is alert.  ? ? ?ED Results / Procedures / Treatments   ?Labs ?(all labs ordered are listed, but only abnormal results are displayed) ?Labs Reviewed - No data to display ? ?EKG ?None ? ?Radiology ?No results found. ? ?Procedures ?Procedures  ? ? ?Medications Ordered in  ED ?Medications  ?HYDROcodone-acetaminophen (NORCO/VICODIN) 5-325 MG per tablet 1 tablet (has no administration in time range)  ?clindamycin (CLEOCIN) capsule 300 mg (has no administration in time range)  ? ? ?ED Course/ Medical Decision Making/ A&P ?  ? ?Patient seen and examined. History obtained directly from patient.  ? ?Medications/Fluids: Ordered: 1 dose of hydrocodone and clindamycin.  ? ?Most recent vital signs reviewed and are as follows: ?BP (!) 165/107   Pulse 95   Temp 98.3 ?F (36.8 ?C) (Oral)   Resp 16   LMP 01/06/2022 (Approximate)   SpO2 99%  ? ?Initial impression: Dental pain due to cavities, no obvious abscess ? ?Plan: Change antibiotic from penicillin to clindamycin.  Advised use of probiotics while taking this medication.  #4 tablets Vicodin ordered for pain control.  Patient looks uncomfortable.  Substance reporting database reviewed. ? ?Patient counseled on use of narcotic pain medications. Counseled not to combine these medications with others containing tylenol. Urged not to drink alcohol, drive, or perform any other activities that requires focus while taking these medications. The patient verbalizes understanding and agrees with the plan. ? ?Patient counseled to take prescribed medications as directed, return with worsening facial or neck swelling, and to follow-up with their dentist as soon as possible.  ? ? ?                        ?Medical Decision Making ?Risk ?Prescription drug management. ? ? ?Patient presents for dental pain. They do not have a fever and do not appear septic. Exam unconcerning for Ludwig's angina or other deep tissue infection in neck and I do not feel that advanced imaging is indicated at this time. Low suspicion for PTA, RPA, epiglottis based on exam.  ? ?Patient will be treated for dental infection with antibiotic. Encouraged tylenol/NSAIDs as prescribed or as directed on the packaging for pain.  Small amount of Vicodin, #4 tablets, given for control of pain  control at home.  No previous prescriptions in database.  Encouraged follow-up with a dentist for definitive and long-term management.  ? ? ? ? ? ? ? ? ?Final Clinical Impression(s) / ED Diagnoses ?Final diagnoses:  ?Pain due to dental caries  ? ? ?Rx / DC Orders ?ED Discharge Orders   ? ?      Ordered  ?  clindamycin (CLEOCIN) 150 MG capsule  Every 6 hours       ? 02/16/22 0234  ?  HYDROcodone-acetaminophen (NORCO/VICODIN) 5-325 MG tablet  Every 6 hours PRN       ? 02/16/22 0234  ? ?  ?  ? ?  ? ? ?  ?  Renne CriglerGeiple, Coy Vandoren, PA-C ?02/16/22 [id number] ? ?  ?Gilda CreasePollina, Christopher J, MD ?02/16/22 [phone] ? ?

## 2022-02-16 NOTE — ED Triage Notes (Signed)
Pt reports bottom right dental pain x 2 weeks; unrelieved with abx and naproxen. ?

## 2022-02-16 NOTE — Discharge Instructions (Signed)
Please read and follow all provided instructions. ? ?Your diagnoses today include:  ?1. Pain due to dental caries   ? ?The exam and treatment you received today has been provided on an emergency basis only. This is not a substitute for complete medical or dental care. ? ?Tests performed today include: ?Vital signs. See below for your results today.  ? ?Medications prescribed:  ?Vicodin (hydrocodone/acetaminophen) - narcotic pain medication ? ?DO NOT drive or perform any activities that require you to be awake and alert because this medicine can make you drowsy. BE VERY CAREFUL not to take multiple medicines containing Tylenol (also called acetaminophen). Doing so can lead to an overdose which can damage your liver and cause liver failure and possibly death. ? ?Clindamycin - antibiotic ? ?You have been prescribed an antibiotic medicine: take the entire course of medicine even if you are feeling better. Stopping early can cause the antibiotic not to work. ? ?Take any prescribed medications only as directed. ? ?Home care instructions:  ?Follow any educational materials contained in this packet. ? ?Follow-up instructions: ?Please follow-up with your dentist as planned for further evaluation of your symptoms.  ? ?Return instructions:  ?Please return to the Emergency Department if you experience worsening symptoms. ?Please return if you develop a fever, you develop more swelling in your face or neck, you have trouble breathing or swallowing food. ?Please return if you have any other emergent concerns. ? ?Additional Information: ? ?Your vital signs today were: ?BP (!) 165/107   Pulse 95   Temp 98.3 ?F (36.8 ?C) (Oral)   Resp 16   LMP 01/06/2022 (Approximate)   SpO2 99%  ?If your blood pressure (BP) was elevated above 135/85 this visit, please have this repeated by your doctor within one month. ?-------------- ? ?

## 2022-02-16 NOTE — ED Notes (Signed)
Dc instructions given and pt verbalized understanding. Pt called a taxi for transportation after pain medication given prior to be discharge from the ED. Pt oriented she will not be able to drive while she is taking narcotics for pain, pt verbalized understanding. ?

## 2022-03-04 ENCOUNTER — Telehealth: Payer: Medicaid Other | Admitting: Nurse Practitioner

## 2022-03-04 DIAGNOSIS — J069 Acute upper respiratory infection, unspecified: Secondary | ICD-10-CM | POA: Diagnosis not present

## 2022-03-04 MED ORDER — PREDNISONE 20 MG PO TABS: 20.0000 mg | ORAL_TABLET | Freq: Every day | ORAL | 0 refills | Status: AC

## 2022-03-04 MED ORDER — ALBUTEROL SULFATE HFA 108 (90 BASE) MCG/ACT IN AERS: 2.0000 | INHALATION_SPRAY | Freq: Four times a day (QID) | RESPIRATORY_TRACT | 0 refills | Status: DC | PRN

## 2022-03-04 NOTE — Patient Instructions (Signed)
?[name], thank you for joining Claiborne Rigg, NP for today's virtual visit.  While this provider is not your primary care provider (PCP), if your PCP is located in our provider database this encounter information will be shared with them immediately following your visit. ? ?Consent: ?(Patient) Alexandra Gray provided verbal consent for this virtual visit at the beginning of the encounter. ? ?Current Medications: ? ?Current Outpatient Medications:  ?  predniSONE (DELTASONE) 20 MG tablet, Take 1 tablet (20 mg total) by mouth daily with breakfast for 5 days., Disp: 5 tablet, Rfl: 0 ?  albuterol (VENTOLIN HFA) 108 (90 Base) MCG/ACT inhaler, Inhale 2 puffs into the lungs every 6 (six) hours as needed for wheezing or shortness of breath. Please instruct in technique and dispense with spacer, Disp: 18 g, Rfl: 0 ?  amLODipine (NORVASC) 5 MG tablet, Take 1 tablet (5 mg total) by mouth daily., Disp: 30 tablet, Rfl: 0 ?  clindamycin (CLEOCIN) 150 MG capsule, Take 2 capsules (300 mg total) by mouth every 6 (six) hours., Disp: 56 capsule, Rfl: 0 ?  desvenlafaxine (PRISTIQ) 50 MG 24 hr tablet, Take 1 tablet (50 mg total) by mouth daily., Disp: 30 tablet, Rfl: 0 ?  HYDROcodone-acetaminophen (NORCO/VICODIN) 5-325 MG tablet, Take 1 tablet by mouth every 6 (six) hours as needed for severe pain., Disp: 4 tablet, Rfl: 0 ?  melatonin 3 MG TABS tablet, Take 1 tablet (3 mg total) by mouth at bedtime. (Patient not taking: Reported on 12/05/2021), Disp: 30 tablet, Rfl: 0 ?  Naproxen 375 MG TBEC, Take 1 tablet (375 mg total) by mouth in the morning and at bedtime., Disp: 60 tablet, Rfl: 0 ?  OXcarbazepine (TRILEPTAL) 150 MG tablet, Take 1 tablet (150 mg total) by mouth 2 (two) times daily., Disp: 60 tablet, Rfl: 0 ?  prazosin (MINIPRESS) 2 MG capsule, Take 1 capsule (2 mg total) by mouth at bedtime., Disp: 30 capsule, Rfl: 0 ?  QUEtiapine (SEROQUEL) 50 MG tablet, Take 1 tablet (50 mg total) by mouth at bedtime., Disp: 30  tablet, Rfl: 0  ? ?Medications ordered in this encounter:  ?Meds ordered this encounter  ?Medications  ? predniSONE (DELTASONE) 20 MG tablet  ?  Sig: Take 1 tablet (20 mg total) by mouth daily with breakfast for 5 days.  ?  Dispense:  5 tablet  ?  Refill:  0  ?  Order Specific Question:   Supervising Provider  ?  Answer:   Eber Hong [[id number]]  ? albuterol (VENTOLIN HFA) 108 (90 Base) MCG/ACT inhaler  ?  Sig: Inhale 2 puffs into the lungs every 6 (six) hours as needed for wheezing or shortness of breath. Please instruct in technique and dispense with spacer  ?  Dispense:  18 g  ?  Refill:  0  ?  Order Specific Question:   Supervising Provider  ?  Answer:   Eber Hong [[id number]]  ?  ? ?*If you need refills on other medications prior to your next appointment, please contact your pharmacy* ? ?Follow-Up: ?Call back or seek an in-person evaluation if the symptoms worsen or if the condition fails to improve as anticipated. ? ?Other Instructions ?INSTRUCTIONS: use a humidifier for nasal congestion ?Drink plenty of fluids, rest and wash hands frequently to avoid the spread of infection ?Alternate tylenol and Motrin for relief of fever  ?Follow up with PCP for evaluation of asthma or restrictive lung disease or hypoventilation 2/2 morbid obesity ? ? ?If you have been instructed  to have an in-person evaluation today at a local Urgent Care facility, please use the link below. It will take you to a list of all of our available [facility], including address, phone number and hours of operation. Please do not delay care.  ?[facility] ? ?If you or a family member do not have a primary care provider, use the link below to schedule a visit and establish care. When you choose a [place] primary care physician or advanced practice provider, you gain a long-term partner in health. ?Find a Primary Care Provider ? ?Learn more about [facility]'s in-office and virtual care options: ?[facility] - Get Care Now   ?

## 2022-03-04 NOTE — Progress Notes (Signed)
?Virtual Visit Consent  ? ?[name], you are scheduled for a virtual visit with a [facility] provider today.   ?  ?Just as with appointments in the office, your consent must be obtained to participate.  Your consent will be active for this visit and any virtual visit you may have with one of our providers in the next 365 days.   ?  ?If you have a MyChart account, a copy of this consent can be sent to you electronically.  All virtual visits are billed to your insurance company just like a traditional visit in the office.   ? ?As this is a virtual visit, video technology does not allow for your provider to perform a traditional examination.  This may limit your provider's ability to fully assess your condition.  If your provider identifies any concerns that need to be evaluated in person or the need to arrange testing (such as labs, EKG, etc.), we will make arrangements to do so.   ?  ?Although advances in technology are sophisticated, we cannot ensure that it will always work on either your end or our end.  If the connection with a video visit is poor, the visit may have to be switched to a telephone visit.  With either a video or telephone visit, we are not always able to ensure that we have a secure connection.    ? ?I need to obtain your verbal consent now.   Are you willing to proceed with your visit today?  ?  ?[name] has provided verbal consent on 03/04/2022 for a virtual visit (video or telephone). ?  ?Claiborne Rigg, NP  ? ?Date: 03/04/2022 3:46 PM ? ? ?Virtual Visit via Video Note  ? ?IClaiborne Rigg, connected with  Alexandra Gray  ([id number], 16-Jun-1991) on 03/04/22 at  3:30 PM EDT by a video-enabled telemedicine application and verified that I am speaking with the correct person using two identifiers. ? ?Location: ?Patient: Virtual Visit Location Patient: Home ?Provider: Virtual Visit Location Provider: Home Office ?  ?I discussed the limitations of evaluation and management by  telemedicine and the availability of in person appointments. The patient expressed understanding and agreed to proceed.   ? ?History of Present Illness: ?Alexandra Gray is a 31 y.o. who identifies as a female who was assigned female at birth, and is being seen today for Bronchitis. ? ?Notes 3 day onset of dry cough, chest congestion, lungs hurt when she coughs, shortness of breath. Denies fever, chest pain, productive cough of purulent sputum. Cough is made worse by cold air or changes in weather. She does not have a history of asthma or COPD.  ? ? ?Problems:  ?Patient Active Problem List  ? Diagnosis Date Noted  ? Benign essential HTN 10/08/2021  ? BMI 50.0-59.9, adult (HCC) 05/28/2018  ? 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  ? PTSD (post-traumatic stress disorder) 02/16/2014  ? Sleep apnea 02/16/2014  ? Severe episode of recurrent major depressive disorder, 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  ?  ?Allergies:  ?Allergies  ?Allergen Reactions  ? Infuvite Adult [Multiple Vitamin] Shortness Of Breath  ? Nubain [Nalbuphine Hcl] Other (See Comments)  ?  Makes patient hot  ? ?Medications:  ?Current Outpatient Medications:  ?  albuterol (VENTOLIN HFA) 108 (90 Base) MCG/ACT inhaler, Inhale 2 puffs into the lungs every 6 (six) hours as needed for wheezing or shortness of breath. Please instruct in technique and dispense with spacer, Disp: 8  g, Rfl: 0 ?  amLODipine (NORVASC) 5 MG tablet, Take 1 tablet (5 mg total) by mouth daily., Disp: 30 tablet, Rfl: 0 ?  clindamycin (CLEOCIN) 150 MG capsule, Take 2 capsules (300 mg total) by mouth every 6 (six) hours., Disp: 56 capsule, Rfl: 0 ?  desvenlafaxine (PRISTIQ) 50 MG 24 hr tablet, Take 1 tablet (50 mg total) by mouth daily., Disp: 30 tablet, Rfl: 0 ?  HYDROcodone-acetaminophen (NORCO/VICODIN) 5-325 MG tablet, Take 1 tablet by mouth every 6 (six) hours as needed for severe pain., Disp: 4 tablet, Rfl: 0 ?  melatonin 3 MG TABS tablet, Take 1 tablet (3 mg  total) by mouth at bedtime. (Patient not taking: Reported on 12/05/2021), Disp: 30 tablet, Rfl: 0 ?  Naproxen 375 MG TBEC, Take 1 tablet (375 mg total) by mouth in the morning and at bedtime., Disp: 60 tablet, Rfl: 0 ?  OXcarbazepine (TRILEPTAL) 150 MG tablet, Take 1 tablet (150 mg total) by mouth 2 (two) times daily., Disp: 60 tablet, Rfl: 0 ?  prazosin (MINIPRESS) 2 MG capsule, Take 1 capsule (2 mg total) by mouth at bedtime., Disp: 30 capsule, Rfl: 0 ?  QUEtiapine (SEROQUEL) 50 MG tablet, Take 1 tablet (50 mg total) by mouth at bedtime., Disp: 30 tablet, Rfl: 0 ? ?Observations/Objective: ?Patient is well-developed, well-nourished in no acute distress.  ?Resting comfortably  at home.  ?Head is normocephalic, atraumatic.  ?No labored breathing.  ?Speech is clear and coherent with logical content.  ?Patient is alert and oriented at baseline.  ? ? ?Assessment and Plan: ?1. Viral URI with cough ?INSTRUCTIONS: use a humidifier for nasal congestion ?Drink plenty of fluids, rest and wash hands frequently to avoid the spread of infection ?Alternate tylenol and Motrin for relief of fever  ?Follow up with PCP for evaluation of asthma or restrictive lung disease or hypoventilation 2/2 morbid obesity ? ?Follow Up Instructions: ?I discussed the assessment and treatment plan with the patient. The patient was provided an opportunity to ask questions and all were answered. The patient agreed with the plan and demonstrated an understanding of the instructions.  A copy of instructions were sent to the patient via MyChart unless otherwise noted below.  ? ?The patient was advised to call back or seek an in-person evaluation if the symptoms worsen or if the condition fails to improve as anticipated. ? ?Time:  ?I spent 11 minutes with the patient via telehealth technology discussing the above problems/concerns.   ? ?Claiborne Rigg, NP  ?

## 2022-04-10 ENCOUNTER — Emergency Department (HOSPITAL_COMMUNITY): Payer: Medicaid Other

## 2022-04-10 ENCOUNTER — Emergency Department (HOSPITAL_COMMUNITY)
Admission: EM | Admit: 2022-04-10 | Discharge: 2022-04-11 | Disposition: A | Discharge status: Home / Self Care | Payer: Medicaid Other | Attending: Emergency Medicine | Admitting: Emergency Medicine

## 2022-04-10 ENCOUNTER — Other Ambulatory Visit: Payer: Self-pay

## 2022-04-10 ENCOUNTER — Encounter (HOSPITAL_COMMUNITY): Payer: Self-pay | Admitting: Emergency Medicine

## 2022-04-10 DIAGNOSIS — R531 Weakness: Secondary | ICD-10-CM | POA: Insufficient documentation

## 2022-04-10 DIAGNOSIS — M5410 Radiculopathy, site unspecified: Secondary | ICD-10-CM

## 2022-04-10 DIAGNOSIS — I10 Essential (primary) hypertension: Secondary | ICD-10-CM | POA: Diagnosis not present

## 2022-04-10 DIAGNOSIS — H538 Other visual disturbances: Secondary | ICD-10-CM | POA: Insufficient documentation

## 2022-04-10 DIAGNOSIS — M79622 Pain in left upper arm: Secondary | ICD-10-CM | POA: Insufficient documentation

## 2022-04-10 DIAGNOSIS — Z20822 Contact with and (suspected) exposure to covid-19: Secondary | ICD-10-CM | POA: Insufficient documentation

## 2022-04-10 DIAGNOSIS — R0789 Other chest pain: Secondary | ICD-10-CM | POA: Diagnosis not present

## 2022-04-10 DIAGNOSIS — D72829 Elevated white blood cell count, unspecified: Secondary | ICD-10-CM | POA: Diagnosis not present

## 2022-04-10 DIAGNOSIS — M542 Cervicalgia: Secondary | ICD-10-CM | POA: Diagnosis not present

## 2022-04-10 DIAGNOSIS — M792 Neuralgia and neuritis, unspecified: Secondary | ICD-10-CM

## 2022-04-10 DIAGNOSIS — Z79899 Other long term (current) drug therapy: Secondary | ICD-10-CM | POA: Insufficient documentation

## 2022-04-10 DIAGNOSIS — R200 Anesthesia of skin: Secondary | ICD-10-CM | POA: Diagnosis present

## 2022-04-10 LAB — CBC WITH DIFFERENTIAL/PLATELET
Abs Immature Granulocytes: 0.05 10*3/uL (ref 0.00–0.07)
Basophils Absolute: 0 10*3/uL (ref 0.0–0.1)
Basophils Relative: 0 %
Eosinophils Absolute: 0.2 10*3/uL (ref 0.0–0.5)
Eosinophils Relative: 1 %
HCT: 41.3 % (ref 36.0–46.0)
Hemoglobin: 12.8 g/dL (ref 12.0–15.0)
Immature Granulocytes: 1 %
Lymphocytes Relative: 38 %
Lymphs Abs: 4.1 10*3/uL — ABNORMAL HIGH (ref 0.7–4.0)
MCH: 26.3 pg (ref 26.0–34.0)
MCHC: 31 g/dL (ref 30.0–36.0)
MCV: 85 fL (ref 80.0–100.0)
Monocytes Absolute: 0.7 10*3/uL (ref 0.1–1.0)
Monocytes Relative: 7 %
Neutro Abs: 5.7 10*3/uL (ref 1.7–7.7)
Neutrophils Relative %: 53 %
Platelets: 308 10*3/uL (ref 150–400)
RBC: 4.86 MIL/uL (ref 3.87–5.11)
RDW: 14.1 % (ref 11.5–15.5)
WBC: 10.8 10*3/uL — ABNORMAL HIGH (ref 4.0–10.5)
nRBC: 0 % (ref 0.0–0.2)

## 2022-04-10 LAB — I-STAT BETA HCG BLOOD, ED (MC, WL, AP ONLY): I-stat hCG, quantitative: <5 m[IU]/mL (ref <5)

## 2022-04-10 NOTE — ED Provider Triage Note (Signed)
Emergency Medicine Provider Triage Evaluation Note ? ?[name] , a 31 y.o. female  was evaluated in triage.  Pt complains of pain and numbness in left arm x3 days.  Starts at the neck and radiates down left arm.  States hand feels "tingly".  But she is able to use it.  Pain worse with lifting the arm above the head.  She denies any focal weakness.  Also reports chest pain. ? ?Review of Systems  ?Positive: Chest pain, arm pain/numbness ?Negative: fever ? ?Physical Exam  ?BP (!) 164/104 (BP Location: Right Arm)   Pulse 98   Temp 98.9 ?F (37.2 ?C) (Oral)   Resp 20   SpO2 97%  ? ?Gen:   Awake, no distress   ?Resp:  Normal effort  ?MSK:   Moves extremities without difficulty  ?Other:  Pain elicited with attempted ROM at left shoulder; able to flex at elbow, wrist, fingers, radial pulse intact, able to hold papers/phone with left hand during triage ? ?Medical Decision Making  ?Medically screening exam initiated at 10:51 PM.  Appropriate orders placed.  JAYSIE BENTHALL was informed that the remainder of the evaluation will be completed by another provider, this initial triage assessment does not replace that evaluation, and the importance of remaining in the ED until their evaluation is complete. ? ?Pain and numbness to left arm x3 days.  No focal deficits noted in triage.  Seems to be more MSK related that acute CVA/TIA.  Also having chest pain.  Will check labs, CXR, EKG. ?  ?Garlon Hatchet, PA-C ?04/10/22 2303 ? ?

## 2022-04-10 NOTE — ED Triage Notes (Signed)
Pt reported to ED with c/o pain, numbness and swelling to left arm x3 days. Denies nausea/vomiting. Pt states she has also had increased weakness in hand. Denies any injury to arm.  ?

## 2022-04-11 ENCOUNTER — Emergency Department (HOSPITAL_COMMUNITY): Payer: Medicaid Other

## 2022-04-11 LAB — BASIC METABOLIC PANEL
Anion gap: 8 (ref 5–15)
BUN: 10 mg/dL (ref 6–20)
CO2: 25 mmol/L (ref 22–32)
Calcium: 9.2 mg/dL (ref 8.9–10.3)
Chloride: 106 mmol/L (ref 98–111)
Creatinine, Ser: 0.76 mg/dL (ref 0.44–1.00)
GFR, Estimated: >60 mL/min (ref >60)
Glucose, Bld: 114 mg/dL — ABNORMAL HIGH (ref 70–99)
Potassium: 3.7 mmol/L (ref 3.5–5.1)
Sodium: 139 mmol/L (ref 135–145)

## 2022-04-11 LAB — RESP PANEL BY RT-PCR (FLU A&B, COVID) ARPGX2
Influenza A by PCR: NEGATIVE
Influenza B by PCR: NEGATIVE
SARS Coronavirus 2 by RT PCR: NEGATIVE

## 2022-04-11 LAB — TROPONIN I (HIGH SENSITIVITY)
Troponin I (High Sensitivity): 3 ng/L (ref <18)
Troponin I (High Sensitivity): <2 ng/L (ref <18)

## 2022-04-11 LAB — TSH: TSH: 2.309 u[IU]/mL (ref 0.350–4.500)

## 2022-04-11 IMAGING — MR MR HEAD WO/W CM
5 of 9 series · 19 of 48 positions shown · IV contrast (gadavist)
Comparison: CT head [DATE].

CLINICAL DATA: Neuro deficit, acute, stroke suspected

EXAM:
MRI HEAD WITHOUT AND WITH CONTRAST
TECHNIQUE: Multiplanar, multiecho pulse sequences of the brain and surrounding
structures were obtained without and with intravenous contrast.
CONTRAST:  10mL GADAVIST GADOBUTROL 1 MMOL/ML IV SOLN

[Series 11: T2 · sagittal · 3.0mm · 0.43mm/px · 4 of 16 slices shown (1 of 3)]
[im 1/16]
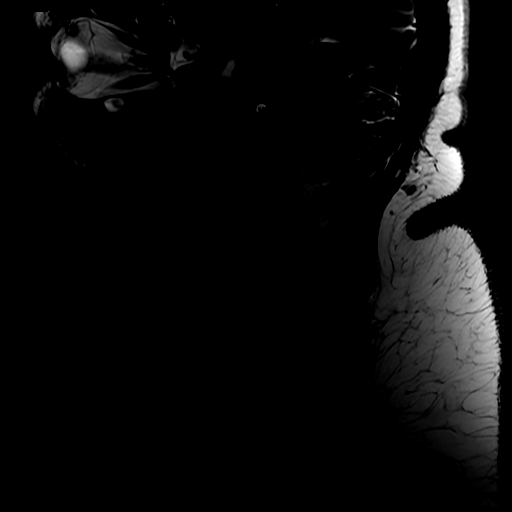
[im 6/16]
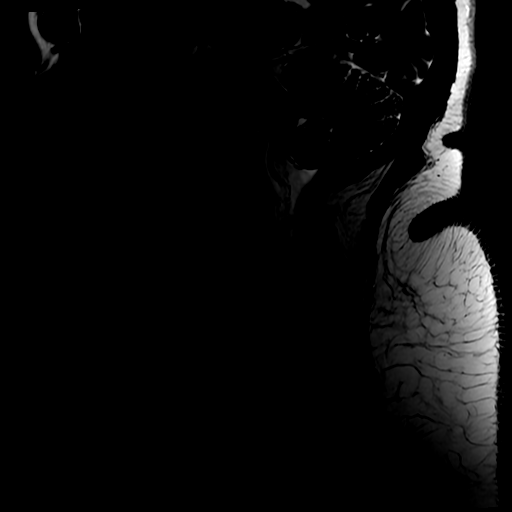
[im 11/16]
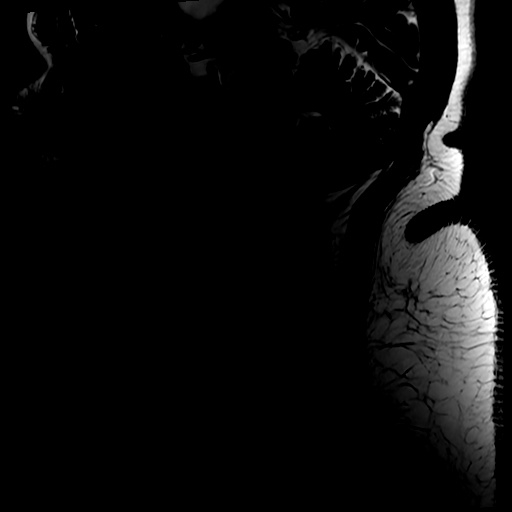
[im 16/16]
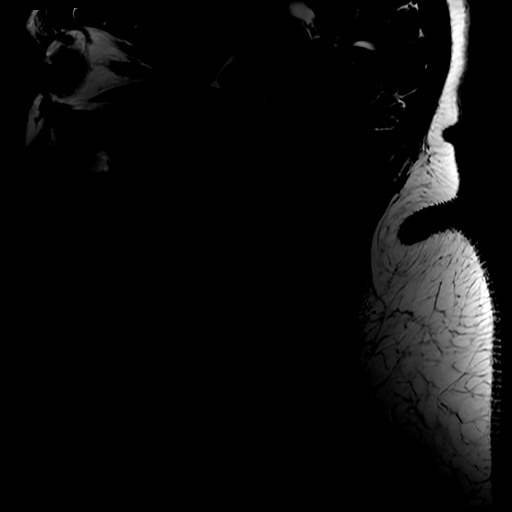

[Series 12: FLAIR · sagittal · 3.0mm · 0.43mm/px · 3 of 16 slices shown]
[im 1/16]
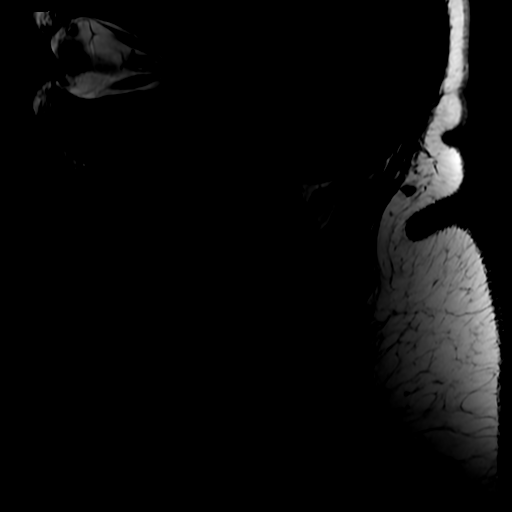
[im 8/16]
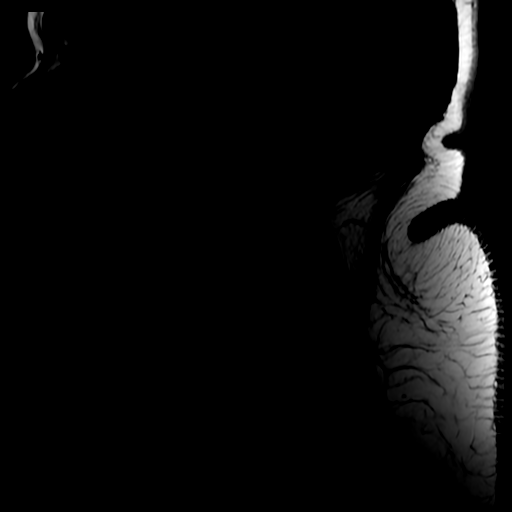
[im 16/16]
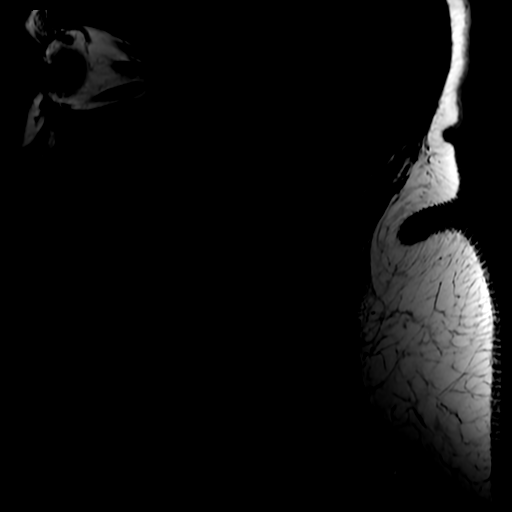

[Series 15: T2 · axial · 3.0mm · 0.35mm/px · z∈[-221,-141]mm · 5 of 26 slices shown (2 of 3)]
[im 1/26]
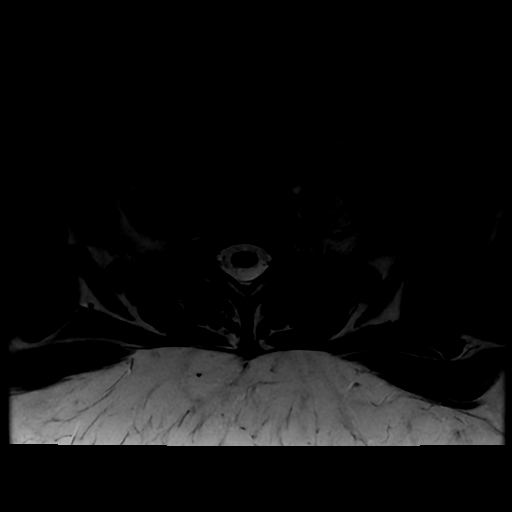
[im 7/26]
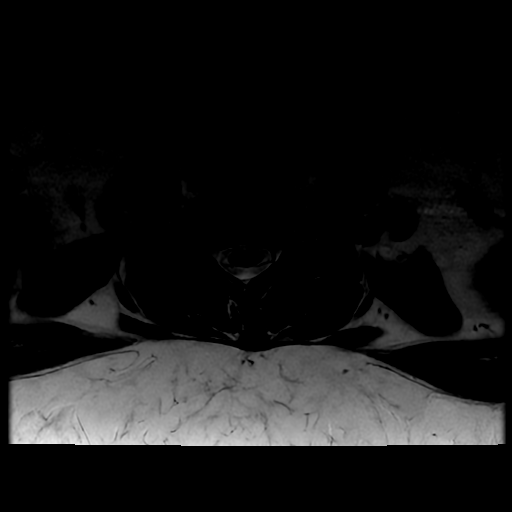
[im 13/26]
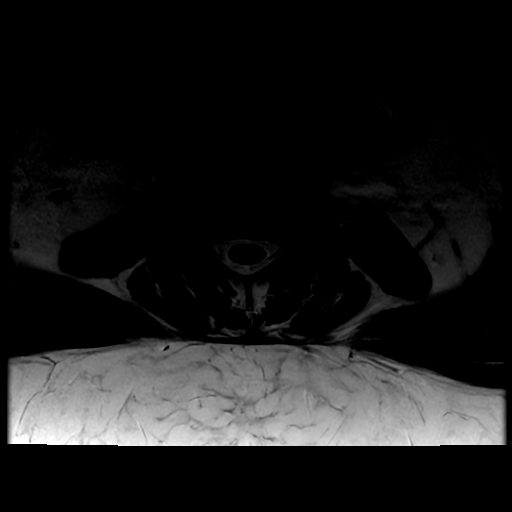
[im 19/26]
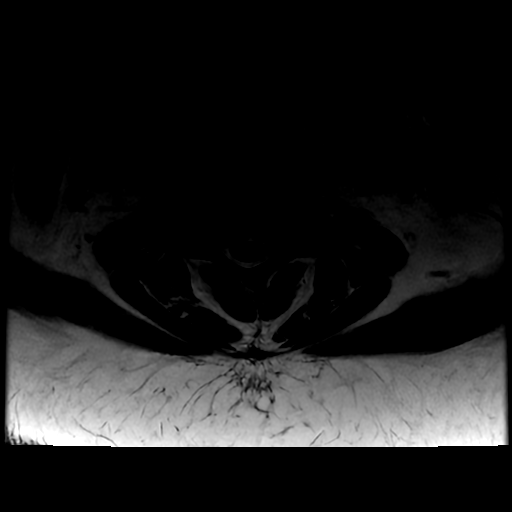
[im 26/26]
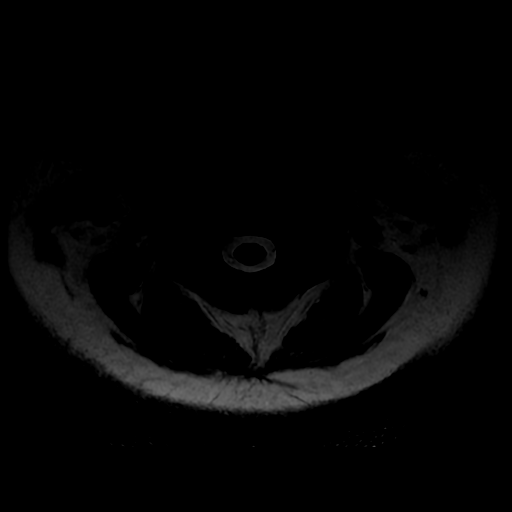

[Series 16: T1 · axial · non-contrast · 3.0mm · 0.35mm/px · z∈[-221,-202]mm · 2 of 26 slices shown]
[im 1/26]
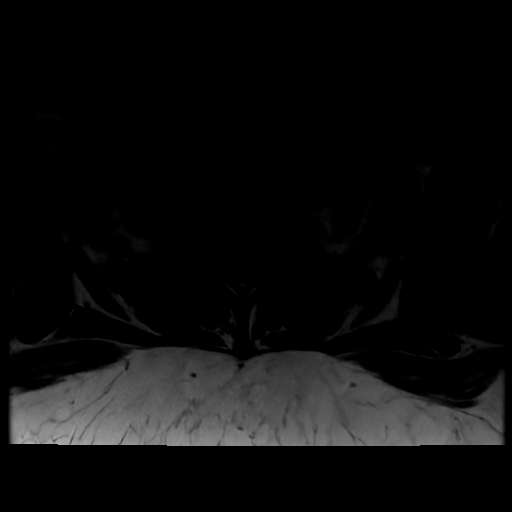
[im 7/26]
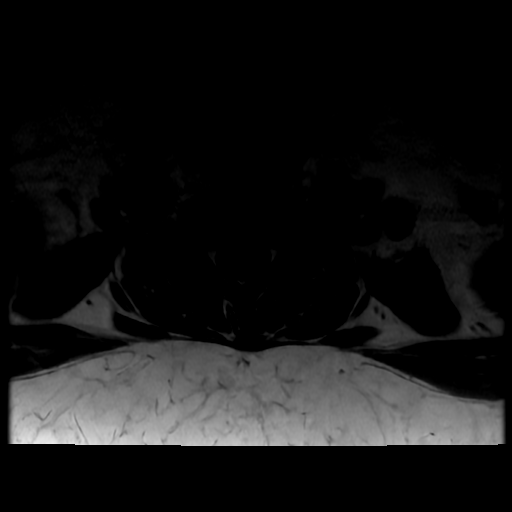

[Series 17: T2 · axial · 3.0mm · 0.35mm/px · z∈[-221,-141]mm · 5 of 26 slices shown (3 of 3)]
[im 1/26]
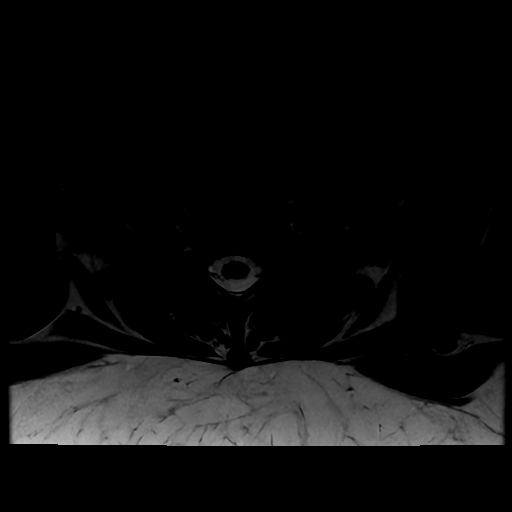
[im 7/26]
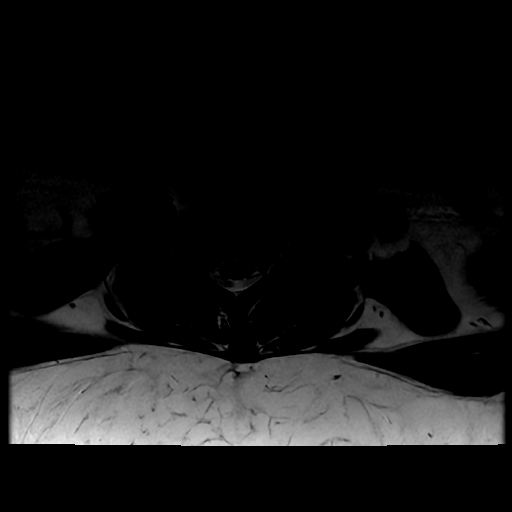
[im 13/26]
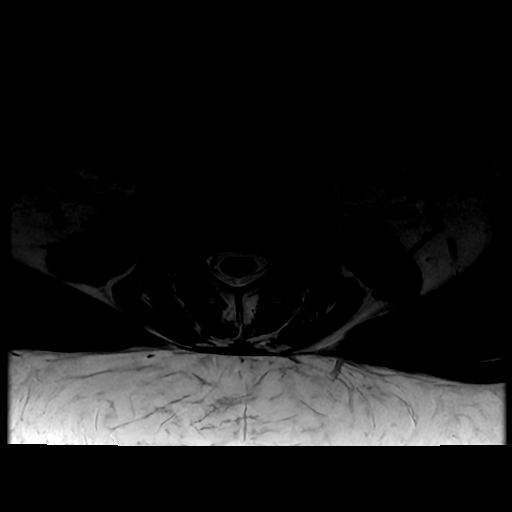
[im 19/26]
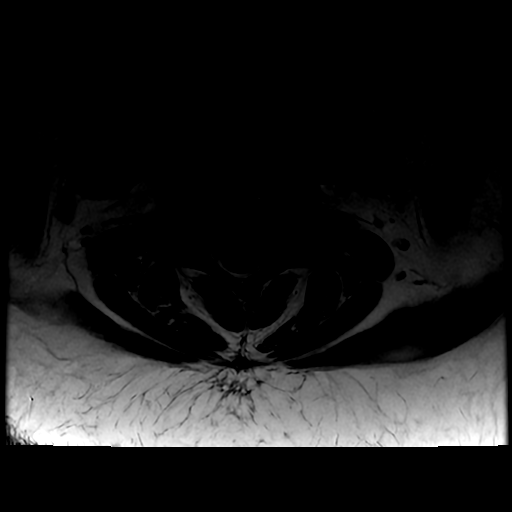
[im 26/26]
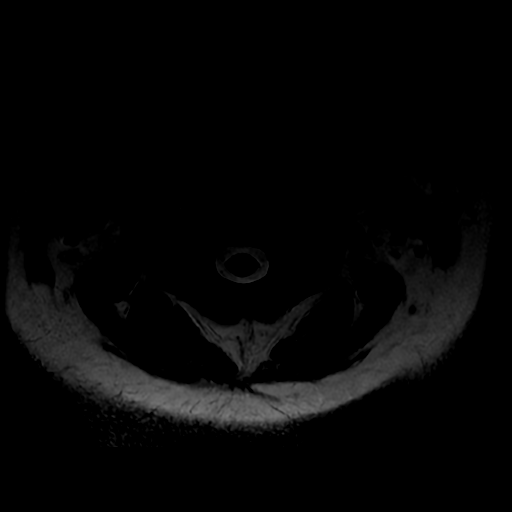

[19 of 48 positions shown; findings below may reference images not displayed]

FINDINGS: Brain: No acute infarction, hemorrhage, hydrocephalus, extra-axial
collection or mass lesion. No pathologic enhancement.

Vascular: Major arterial flow voids are maintained at the skull
base.

Skull and upper cervical spine: Normal marrow signal.

Sinuses/Orbits: Bilateral maxillary sinus retention cyst. Otherwise,
clear sinuses. No acute orbital findings.

Other: No mastoid effusions.
IMPRESSION: No evidence of acute intracranial abnormality.

## 2022-04-11 IMAGING — MR MR HEAD WO/W CM
8 of 17 series · 23 of 48 positions shown · IV contrast (gadavist)
Comparison: CT head [DATE].

CLINICAL DATA: Neuro deficit, acute, stroke suspected

EXAM:
MRI HEAD WITHOUT AND WITH CONTRAST
TECHNIQUE: Multiplanar, multiecho pulse sequences of the brain and surrounding
structures were obtained without and with intravenous contrast.
CONTRAST:  10mL GADAVIST GADOBUTROL 1 MMOL/ML IV SOLN

[Series 2: DWI · axial · 3.0mm · 0.94mm/px · z∈[-114,+19]mm · 6 of 92 slices shown (1 of 2)]
[im 1/92]
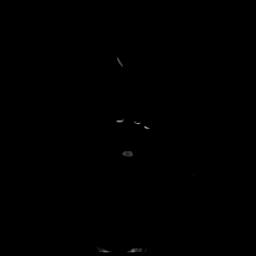
[im 19/92]
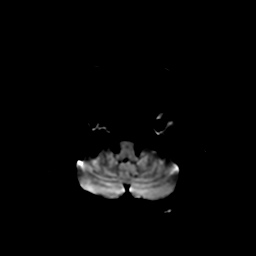
[im 37/92]
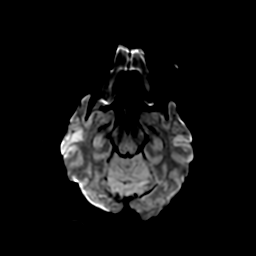
[im 55/92]
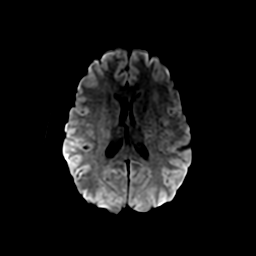
[im 73/92]
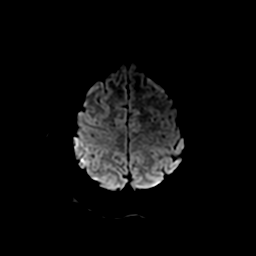
[im 92/92]
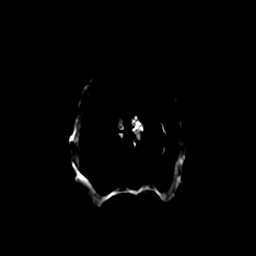

[Series 3: DWI · coronal · 4.0mm · 0.94mm/px · 5 of 69 slices shown (2 of 2)]
[im 1/69]
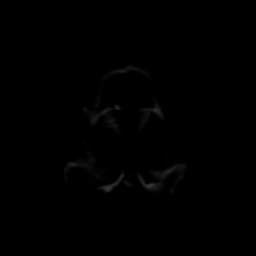
[im 18/69]
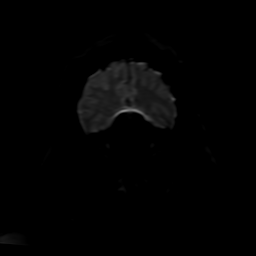
[im 35/69]
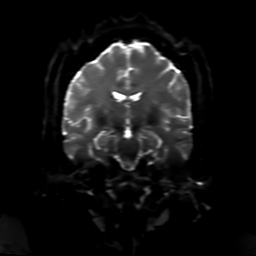
[im 52/69]
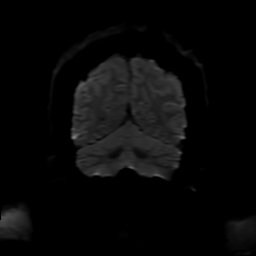
[im 69/69]
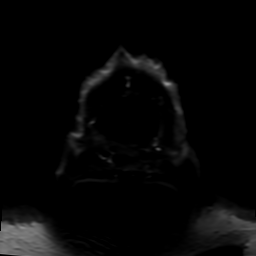

[Series 4: FLAIR · sagittal · 5.0mm · 0.23mm/px · 2 of 23 slices shown (1 of 3)]
[im 1/23]
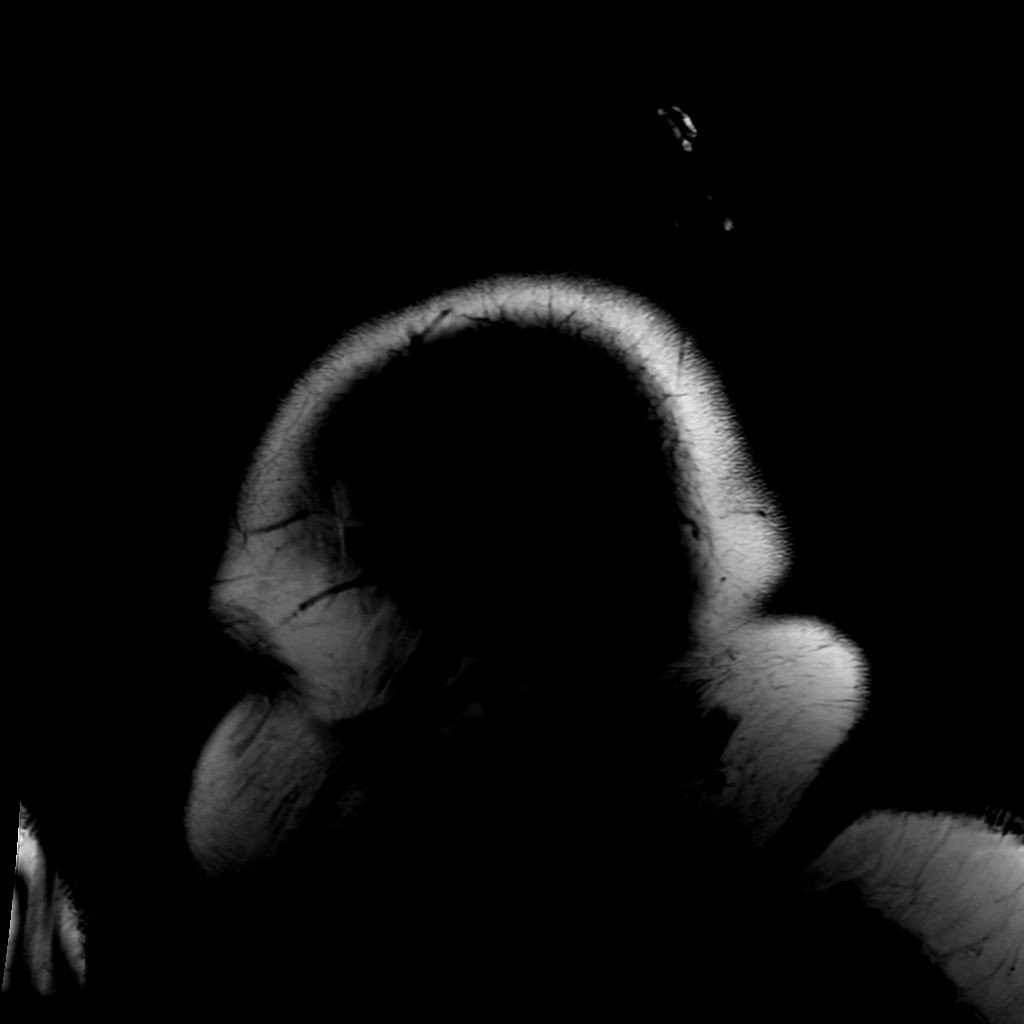
[im 23/23]
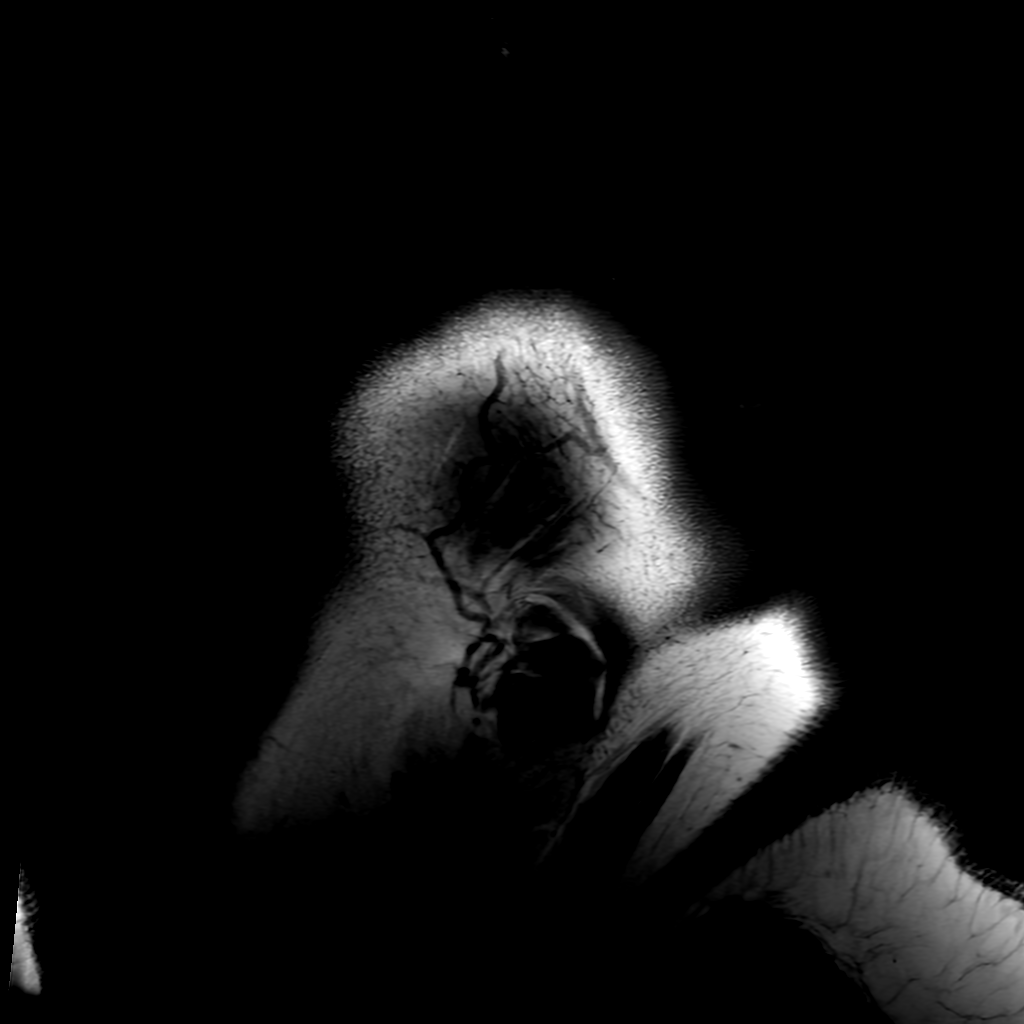

[Series 6: FLAIR · axial · 4.0mm · 0.45mm/px · z∈[-114,+20]mm · 2 of 32 slices shown (2 of 3)]
[im 1/32]
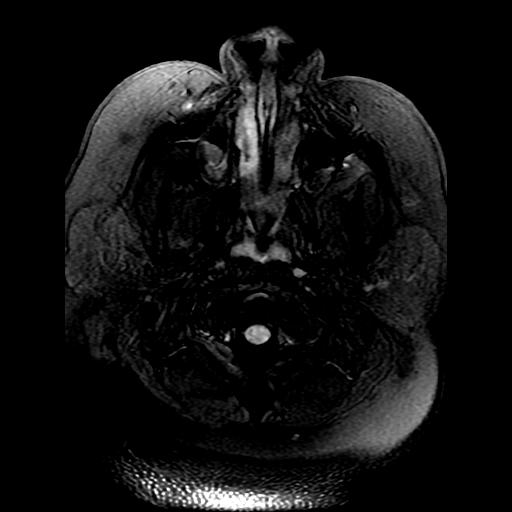
[im 32/32]
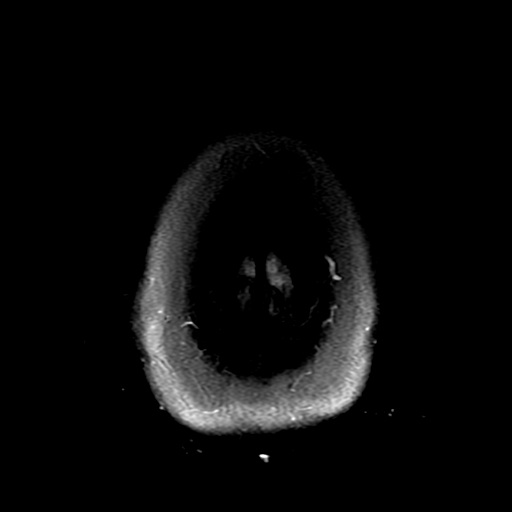

[Series 12: FLAIR · sagittal · 3.0mm · 0.43mm/px · 1 of 16 slices shown (3 of 3)]
[im 1/16]
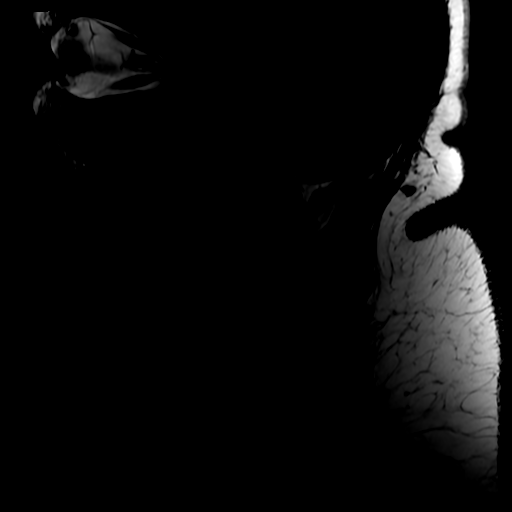

[Series 19: T1 post-contrast · axial · 3.0mm · 0.35mm/px · z∈[-221,-141]mm · 2 of 26 slices shown]
[im 1/26]
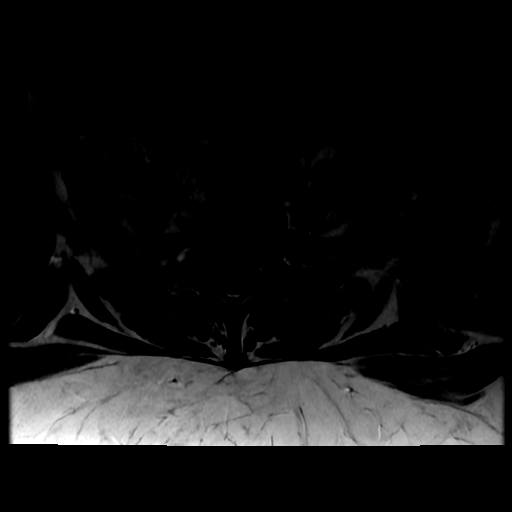
[im 26/26]
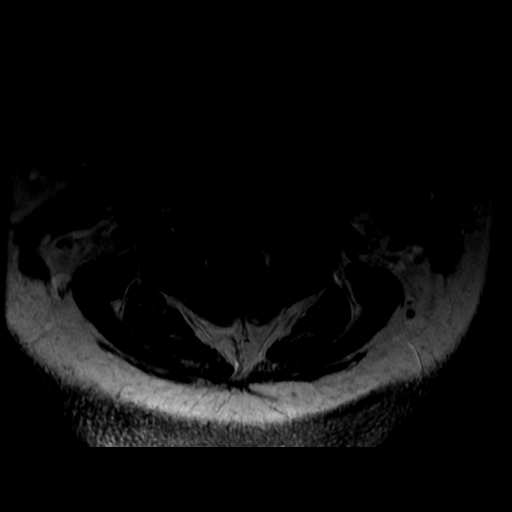

[Series 250: ADC · axial · 3.0mm · 0.94mm/px · z∈[-114,+19]mm · 3 of 46 slices shown (1 of 2)]
[im 1/46]
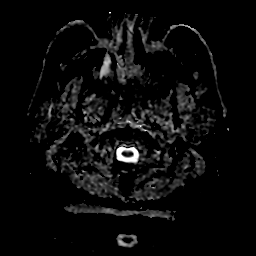
[im 23/46]
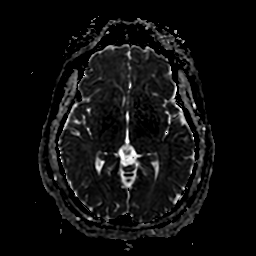
[im 46/46]
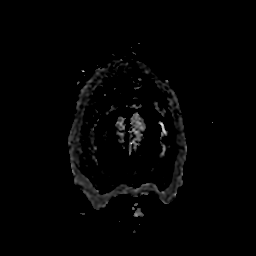

[Series 350: ADC · coronal · 4.0mm · 0.94mm/px · 2 of 35 slices shown (2 of 2)]
[im 1/35]
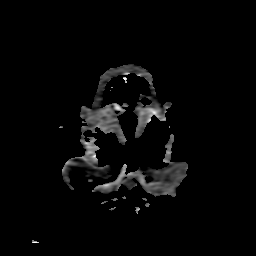
[im 35/35]
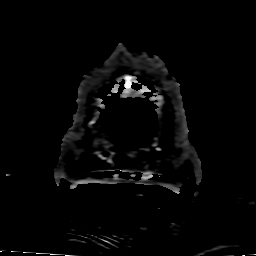

[23 of 48 positions shown; findings below may reference images not displayed]

FINDINGS: Brain: No acute infarction, hemorrhage, hydrocephalus, extra-axial
collection or mass lesion. No pathologic enhancement.

Vascular: Major arterial flow voids are maintained at the skull
base.

Skull and upper cervical spine: Normal marrow signal.

Sinuses/Orbits: Bilateral maxillary sinus retention cyst. Otherwise,
clear sinuses. No acute orbital findings.

Other: No mastoid effusions.
IMPRESSION: No evidence of acute intracranial abnormality.

## 2022-04-11 MED ORDER — SODIUM CHLORIDE 0.9 % IV BOLUS
1000.0000 mL | Freq: Once | INTRAVENOUS | Status: AC
  Administered 2022-04-11: 1000 mL via INTRAVENOUS

## 2022-04-11 MED ORDER — LIDOCAINE 5 % EX PTCH
1.0000 | MEDICATED_PATCH | CUTANEOUS | Status: DC
  Administered 2022-04-11: 1 via TRANSDERMAL
  Filled 2022-04-11: qty 1

## 2022-04-11 MED ORDER — FENTANYL CITRATE PF 50 MCG/ML IJ SOSY
50.0000 ug | PREFILLED_SYRINGE | Freq: Once | INTRAMUSCULAR | Status: AC
  Administered 2022-04-11: 50 ug via INTRAVENOUS
  Filled 2022-04-11: qty 1

## 2022-04-11 MED ORDER — PREDNISONE 10 MG (21) PO TBPK: ORAL_TABLET | Freq: Every day | ORAL | 0 refills | Status: DC

## 2022-04-11 MED ORDER — GADOBUTROL 1 MMOL/ML IV SOLN
10.0000 mL | Freq: Once | INTRAVENOUS | Status: AC | PRN
  Administered 2022-04-11: 10 mL via INTRAVENOUS

## 2022-04-11 MED ORDER — GABAPENTIN 100 MG PO CAPS: 100.0000 mg | ORAL_CAPSULE | Freq: Three times a day (TID) | ORAL | 0 refills | Status: DC

## 2022-04-11 NOTE — ED Provider Notes (Signed)
?[facility] ?Provider Note ? ? ?CSN: [id number] ?Arrival date & time: 04/10/22  2246 ? ?  ? ?History ? ?Chief Complaint  ?Patient presents with  ? Arm Pain  ? ? ?[name] is a 31 y.o. female with a history of hypertension, PTSD, and sleep apnea who presents to the emergency department with complaints of left upper and lower extremity numbness as well as generalized weakness.  Patient reports that about a week ago she started feeling generally weak and shaky in her legs with some left lower extremity numbness.  Over the past 3 days she has developed left upper extremity numbness and pain as well as pain in the chest.  Her numbness and pain in the left upper extremity in the chest is worse with movement.  No alleviating factors.  She reports some mild blurry vision at times.  She feels like her left upper and lower extremity are weaker than the right side in the setting of her generalized weakness. ?She denies diplopia, loss of vision, dizziness, syncope, dyspnea, hemoptysis, or abdominal pain.  Denies history of similar.  Denies drug use.  Denies incontinence or saddle anesthesia. ? ?HPI ? ?  ? ?Home Medications ?Prior to Admission medications   ?Medication Sig Start Date End Date Taking? Authorizing Provider  ?albuterol (VENTOLIN HFA) 108 (90 Base) MCG/ACT inhaler Inhale 2 puffs into the lungs every 6 (six) hours as needed for wheezing or shortness of breath. Please instruct in technique and dispense with spacer 03/04/22  Yes Claiborne RiggFleming, Zelda W, NP  ?amLODipine (NORVASC) 5 MG tablet Take 1 tablet (5 mg total) by mouth daily. 12/17/21 04/11/22 Yes Princess BruinsNguyen, Julie, DO  ?desvenlafaxine (PRISTIQ) 50 MG 24 hr tablet Take 1 tablet (50 mg total) by mouth daily. 12/17/21 04/11/22 Yes Princess BruinsNguyen, Julie, DO  ?HYDROcodone-acetaminophen (NORCO/VICODIN) 5-325 MG tablet Take 1 tablet by mouth every 6 (six) hours as needed for severe pain. 02/16/22  Yes Renne CriglerGeiple, Joshua, PA-C  ?Naproxen 375 MG TBEC  Take 1 tablet (375 mg total) by mouth in the morning and at bedtime. 01/10/22  Yes Cathlyn ParsonsKabbe, Angela M, NP  ?OXcarbazepine (TRILEPTAL) 150 MG tablet Take 1 tablet (150 mg total) by mouth 2 (two) times daily. 12/16/21 04/11/22 Yes Princess BruinsNguyen, Julie, DO  ?prazosin (MINIPRESS) 2 MG capsule Take 1 capsule (2 mg total) by mouth at bedtime. 12/16/21 04/11/22 Yes Princess BruinsNguyen, Julie, DO  ?QUEtiapine (SEROQUEL) 50 MG tablet Take 1 tablet (50 mg total) by mouth at bedtime. 12/16/21 04/11/22 Yes Princess BruinsNguyen, Julie, DO  ?clindamycin (CLEOCIN) 150 MG capsule Take 2 capsules (300 mg total) by mouth every 6 (six) hours. ?Patient not taking: Reported on 04/11/2022 02/16/22   Renne CriglerGeiple, Joshua, PA-C  ?melatonin 3 MG TABS tablet Take 1 tablet (3 mg total) by mouth at bedtime. ?Patient not taking: Reported on 12/05/2021 11/08/20   Thermon Leylandavis, Laura A, NP  ?   ? ?Allergies    ?Infuvite adult [multiple vitamin] and Nubain [nalbuphine hcl]   ? ?Review of Systems   ?Review of Systems  ?Constitutional:  Negative for chills and fever.  ?Respiratory:  Negative for shortness of breath.   ?Cardiovascular:  Positive for chest pain.  ?Gastrointestinal:  Negative for abdominal pain and vomiting.  ?Genitourinary:  Negative for dysuria and hematuria.  ?Musculoskeletal:  Positive for arthralgias and myalgias.  ?Neurological:  Positive for weakness and numbness.  ?All other systems reviewed and are negative. ? ?Physical Exam ?Updated Vital Signs ?BP (!) 118/93   Pulse 72   Temp  98.2 ?F (36.8 ?C) (Oral)   Resp 19   SpO2 100%  ?Physical Exam ?Vitals and nursing note reviewed.  ?Constitutional:   ?   General: She is not in acute distress. ?   Appearance: She is well-developed. She is not toxic-appearing.  ?HENT:  ?   Head: Normocephalic and atraumatic.  ?Eyes:  ?   General:     ?   Right eye: No discharge.     ?   Left eye: No discharge.  ?   Conjunctiva/sclera: Conjunctivae normal.  ?Neck:  ?   Comments: No midline spinal tenderness. ?Cardiovascular:  ?   Rate and Rhythm: Normal rate  and regular rhythm.  ?   Comments: 2+ symmetric radial and DP pulses bilaterally. ?Pulmonary:  ?   Effort: No respiratory distress.  ?   Breath sounds: Normal breath sounds. No wheezing or rales.  ?Chest:  ?   Chest wall: Tenderness (Left anterior chest wall.) present.  ?Abdominal:  ?   General: There is no distension.  ?   Palpations: Abdomen is soft.  ?   Tenderness: There is no abdominal tenderness.  ?Musculoskeletal:  ?   Cervical back: Neck supple. Tenderness (Left cervical paraspinal muscle tenderness.) present.  ?   Comments: Upper extremities: No obvious deformities.  Patient has intact active range of motion throughout.  Mild tenderness throughout the left upper extremity.  Compartments are soft. ?Back: No midline tenderness ?Lower extremities: No obvious deformities.  No focal tenderness to palpation.  ?Skin: ?   General: Skin is warm and dry.  ?Neurological:  ?   Mental Status: She is alert.  ?   Comments: Clear speech.  No facial droop.  Patient report subjective decrease sensation to the left upper and lower extremity compared to the right.  She is having some difficulty discriminating sharp/dull touch.  Patient has 5 out of 5 strength throughout muscle groups of the right upper extremity with 4 out of 5 strength in muscle groups of the left upper extremity.  Left lower extremity plantar/dorsiflexion weaker compared to the right lower extremity, is able to do so against gravity.  ?Psychiatric:     ?   Behavior: Behavior normal.  ? ? ?ED Results / Procedures / Treatments   ?Labs ?(all labs ordered are listed, but only abnormal results are displayed) ?Labs Reviewed  ?CBC WITH DIFFERENTIAL/PLATELET - Abnormal; Notable for the following components:  ?    Result Value  ? WBC 10.8 (*)   ? Lymphs Abs 4.1 (*)   ? All other components within normal limits  ?BASIC METABOLIC PANEL - Abnormal; Notable for the following components:  ? Glucose, Bld 114 (*)   ? All other components within normal limits  ?I-STAT BETA  HCG BLOOD, ED ([facility], [facility], [facility] ONLY)  ?TROPONIN I (HIGH SENSITIVITY)  ?TROPONIN I (HIGH SENSITIVITY)  ? ? ?EKG ?None ? ?Radiology ?DG Chest 2 View ? ?Result Date: 04/10/2022 ?CLINICAL DATA:  Chest pain. EXAM: CHEST - 2 VIEW COMPARISON:  Chest x-ray 12/04/2021. FINDINGS: The heart size and mediastinal contours are within normal limits. Both lungs are clear. The visualized skeletal structures are unremarkable. IMPRESSION: No active cardiopulmonary disease. Electronically Signed   By: Darliss Cheney M.D.   On: 04/10/2022 23:17   ? ?Procedures ?Procedures  ? ? ?Medications Ordered in ED ?Medications  ?sodium chloride 0.9 % bolus 1,000 mL (has no administration in time range)  ?lidocaine (LIDODERM) 5 % 1 patch (has no administration in time range)  ?fentaNYL (  SUBLIMAZE) injection 50 mcg (has no administration in time range)  ? ? ?ED Course/ Medical Decision Making/ A&P ?  ?                        ?Medical Decision Making ? ?Patient presents to the emergency department with left upper and lower extremity numbness with generalized weakness which is more prominent to the left limbs.  She also mentioned some left-sided chest pain as well as pain throughout the left upper extremity.  She is nontoxic, resting comfortably, vitals without significant abnormality. ? ?Chart/nursing note reviewed for additional history. ? ?EKG: No STEMI ? ?I viewed and interpreted labs including CBC, BMP, pregnancy test, and troponins: Fairly unremarkable, mild leukocytosis felt to be nonspecific.  No critical electrolyte derangement or anemia. ? ?Chest x-ray ordered in triage, I viewed and interpreted, no active cardiopulmonary disease. ? ?On exam patient with some weakness throughout the left upper and lower extremity with reported decreased sensation compared to the right upper and lower extremity.  Considering muscle spasm w/ cervical radiculopathy, cord compression, as well as multiple sclerosis and CVA.  Patient has had symptoms for greater than 24  hours therefore she is not a code stroke.  We will treat supportively and obtain MRI brain and cervical spine with and without contrast, discussed with attending Dr. Wilkie Aye who is in agreement.  Patient als

## 2022-04-11 NOTE — Discharge Instructions (Addendum)
You have been evaluated for your left arm left leg numbness weakness and pain.  This is likely due to a pinched nerve however it is important for you to follow-up closely with neurology for outpatient evaluation.  Fortunately MRI of your brain and your neck did not show any concerning finding.  Take prednisone, and gabapentin as prescribed for symptom control.  Return if you have any concern. ?

## 2022-04-11 NOTE — ED Notes (Signed)
PT to MRI

## 2022-04-11 NOTE — ED Provider Notes (Signed)
?  Physical Exam  ?BP 98/66   Pulse 67   Temp 98.2 ?F (36.8 ?C) (Oral)   Resp 18   SpO2 98%  ? ? ?Patient was a handoff to me from the overnight shift. ? ?History gathered, she is complaining of 3-day history of left upper extremity numbness and weakness, and left lower extremity weakness/numbness for 1 week.  An MR scan of her C-spine and brain were pending upon shift handoff earlier this morning, and results are still pending upon shift change this afternoon.  Further assessment and care handed off to Fayrene Helper, PA-C.  ?  ?Peter Garter, [place] ?04/11/22 1637 ? ?  ?Tegeler, Canary Brim, MD ?04/12/22 1054 ? ?

## 2022-04-11 NOTE — ED Notes (Signed)
This rn attempted iv access x2. Unsuccessful. Legrand Como, rn at bedside attempting [place] IV ?

## 2022-04-11 NOTE — ED Provider Notes (Signed)
?Received signout from previous provider, please see his note for complete H&P.  This is a 31 year old female presenting complaining of left upper extremity numbness and weakness ongoing for 3 days as well as left lower extremity weakness numbness and pain ongoing for about a week.  Given patient presenting complaint, MRI of the brain and cervical spine was obtained and I have independently viewed interpreted MRI results as well as labs and agrees with radiologist interpretation.  Fortunately MRI did not reveal any acute finding, labs are reassuring, no electrolyte imbalance.  I discussed this finding with patient, I will provide patient with a Medrol Dosepak, gabapentin, and recommend outpatient follow-up with neurology for further work-up.  Patient voiced understanding and agrees with plan ? ?Blood pressure 130/85, pulse 87, temperature 98.2 ?F (36.8 ?C), temperature source Oral, resp. rate 18, SpO2 97 %. s ? ?Results for orders placed or performed during the hospital encounter of 04/10/22  ?Resp Panel by RT-PCR (Flu A&B, Covid) Nasopharyngeal Swab  ? Specimen: Nasopharyngeal Swab; Nasopharyngeal(NP) swabs in vial transport medium  ?Result Value Ref Range  ? SARS Coronavirus 2 by RT PCR NEGATIVE NEGATIVE  ? Influenza A by PCR NEGATIVE NEGATIVE  ? Influenza B by PCR NEGATIVE NEGATIVE  ?CBC with Differential  ?Result Value Ref Range  ? WBC 10.8 (H) 4.0 - 10.5 K/uL  ? RBC 4.86 3.87 - 5.11 MIL/uL  ? Hemoglobin 12.8 12.0 - 15.0 g/dL  ? HCT 41.3 36.0 - 46.0 %  ? MCV 85.0 80.0 - 100.0 fL  ? MCH 26.3 26.0 - 34.0 pg  ? MCHC 31.0 30.0 - 36.0 g/dL  ? RDW 14.1 11.5 - 15.5 %  ? Platelets 308 150 - 400 K/uL  ? nRBC 0.0 0.0 - 0.2 %  ? Neutrophils Relative % 53 %  ? Neutro Abs 5.7 1.7 - 7.7 K/uL  ? Lymphocytes Relative 38 %  ? Lymphs Abs 4.1 (H) 0.7 - 4.0 K/uL  ? Monocytes Relative 7 %  ? Monocytes Absolute 0.7 0.1 - 1.0 K/uL  ? Eosinophils Relative 1 %  ? Eosinophils Absolute 0.2 0.0 - 0.5 K/uL  ? Basophils Relative 0 %  ?  Basophils Absolute 0.0 0.0 - 0.1 K/uL  ? Immature Granulocytes 1 %  ? Abs Immature Granulocytes 0.05 0.00 - 0.07 K/uL  ?Basic metabolic panel  ?Result Value Ref Range  ? Sodium 139 135 - 145 mmol/L  ? Potassium 3.7 3.5 - 5.1 mmol/L  ? Chloride 106 98 - 111 mmol/L  ? CO2 25 22 - 32 mmol/L  ? Glucose, Bld 114 (H) 70 - 99 mg/dL  ? BUN 10 6 - 20 mg/dL  ? Creatinine, Ser 0.76 0.44 - 1.00 mg/dL  ? Calcium 9.2 8.9 - 10.3 mg/dL  ? GFR, Estimated >60 >60 mL/min  ? Anion gap 8 5 - 15  ?TSH  ?Result Value Ref Range  ? TSH 2.309 0.350 - 4.500 uIU/mL  ?I-Stat Beta hCG blood, ED ([facility], [facility], [facility] only)  ?Result Value Ref Range  ? I-stat hCG, quantitative <5.0 <5 mIU/mL  ? Comment 3          ?Troponin I (High Sensitivity)  ?Result Value Ref Range  ? Troponin I (High Sensitivity) 3 <18 ng/L  ?Troponin I (High Sensitivity)  ?Result Value Ref Range  ? Troponin I (High Sensitivity) <2 <18 ng/L  ? ?DG Chest 2 View ? ?Result Date: 04/10/2022 ?CLINICAL DATA:  Chest pain. EXAM: CHEST - 2 VIEW COMPARISON:  Chest x-ray 12/04/2021. FINDINGS:  The heart size and mediastinal contours are within normal limits. Both lungs are clear. The visualized skeletal structures are unremarkable. IMPRESSION: No active cardiopulmonary disease. Electronically Signed   By: Ronney Asters M.D.   On: 04/10/2022 23:17  ? ?MR Brain W and Wo Contrast ? ?Result Date: 04/11/2022 ?CLINICAL DATA:  Neuro deficit, acute, stroke suspected EXAM: MRI HEAD WITHOUT AND WITH CONTRAST TECHNIQUE: Multiplanar, multiecho pulse sequences of the brain and surrounding structures were obtained without and with intravenous contrast. CONTRAST:  75mL GADAVIST GADOBUTROL 1 MMOL/ML IV SOLN COMPARISON:  CT head 06/17/2017. FINDINGS: Brain: No acute infarction, hemorrhage, hydrocephalus, extra-axial collection or mass lesion. No pathologic enhancement. Vascular: Major arterial flow voids are maintained at the skull base. Skull and upper cervical spine: Normal marrow signal. Sinuses/Orbits: Bilateral  maxillary sinus retention cyst. Otherwise, clear sinuses. No acute orbital findings. Other: No mastoid effusions. IMPRESSION: No evidence of acute intracranial abnormality. Electronically Signed   By: Margaretha Sheffield M.D.   On: 04/11/2022 16:19  ? ?MR Cervical Spine W or Wo Contrast ? ?Result Date: 04/11/2022 ?CLINICAL DATA:  Myelopathy, acute, cervical spine EXAM: MRI CERVICAL SPINE WITHOUT AND WITH CONTRAST TECHNIQUE: Multiplanar and multiecho pulse sequences of the cervical spine, to include the craniocervical junction and cervicothoracic junction, were obtained without and with intravenous contrast. CONTRAST:  68mL GADAVIST GADOBUTROL 1 MMOL/ML IV SOLN COMPARISON:  None Available. FINDINGS: Alignment: Straightening of the normal cervical lordosis. No substantial sagittal subluxation. Vertebrae: Vertebral body heights are maintained. No focal marrow edema to suggest acute fracture or discitis/osteomyelitis. No suspicious bone lesions Cord: Normal cord signal.  No abnormal cord enhancement. Posterior Fossa, vertebral arteries, paraspinal tissues: Visualized vertebral artery flow voids are maintained skull base. Posterior fossa assessed on same day MRI head. Disc levels: No significant disc protrusion, foraminal stenosis, or canal stenosis. IMPRESSION: 1. No significant stenosis. 2. No abnormal cord signal or enhancement. Electronically Signed   By: Margaretha Sheffield M.D.   On: 04/11/2022 16:17   ? ? ?  ?Domenic Moras, PA-C ?04/11/22 1708 ? ?  ?Jeanell Sparrow, DO ?04/12/22 0018 ? ?

## 2022-04-12 ENCOUNTER — Encounter: Payer: Self-pay | Admitting: Neurology

## 2022-04-13 ENCOUNTER — Ambulatory Visit: Payer: Self-pay | Admitting: *Deleted

## 2022-04-13 ENCOUNTER — Telehealth: Payer: Medicaid Other | Admitting: Family Medicine

## 2022-04-13 DIAGNOSIS — R079 Chest pain, unspecified: Secondary | ICD-10-CM

## 2022-04-13 NOTE — Telephone Encounter (Signed)
Reason for Disposition  Pain also in shoulder(s) or arm(s) or jaw (Exception: pain is clearly made worse by movement)  Answer Assessment - Initial Assessment Questions 1. LOCATION: "Where does it hurt?"       Left side of chest/arm swelling 2. RADIATION: "Does the pain go anywhere else?" (e.g., into neck, jaw, arms, back)     Numbness and swelling on left side 3. ONSET: "When did the chest pain begin?" (Minutes, hours or days)      2 weeks ago- weakness started 4. PATTERN "Does the pain come and go, or has it been constant since it started?"  "Does it get worse with exertion?"      constant 5. DURATION: "How long does it last" (e.g., seconds, minutes, hours)     Monday was at ED 6. SEVERITY: "How bad is the pain?"  (e.g., Scale 1-10; mild, moderate, or severe)    - MILD (1-3): doesn't interfere with normal activities     - MODERATE (4-7): interferes with normal activities or awakens from sleep    - SEVERE (8-10): excruciating pain, unable to do any normal activities       *No Answer* 7. CARDIAC RISK FACTORS: "Do you have any history of heart problems or risk factors for heart disease?" (e.g., angina, prior heart attack; diabetes, high blood pressure, high cholesterol, smoker, or strong family history of heart disease)     *No Answer* 8. PULMONARY RISK FACTORS: "Do you have any history of lung disease?"  (e.g., blood clots in lung, asthma, emphysema, birth control pills)     *No Answer* 9. CAUSE: "What do you think is causing the chest pain?"     Possible pinched nerve 10. OTHER SYMPTOMS: "Do you have any other symptoms?" (e.g., dizziness, nausea, vomiting, sweating, fever, difficulty breathing, cough)       *No Answer* 11. PREGNANCY: "Is there any chance you are pregnant?" "When was your last menstrual period?"       *No Answer*  Protocols used: Chest Pain-A-AH

## 2022-04-13 NOTE — Progress Notes (Signed)
[place]   Needs in person eval with worsening chest wall swelling, arm numbness and pain.  Patient acknowledged agreement and understanding of the plan.

## 2022-04-13 NOTE — Patient Instructions (Addendum)
Please go to the nearest ED for in person evaluation of your worsening symptoms.  I hope you feel better soon.

## 2022-04-13 NOTE — Telephone Encounter (Signed)
  Chief Complaint: chest pain, swelling on left side of body Symptoms: chest pain, swelling Frequency: was seen at ED - Monday- patient reports she is no better Pertinent Negatives: Patient denies   Disposition: [x] ED /[] Urgent Care (no appt availability in office) / [] Appointment(In office/virtual)/ []  [facility]/ [] Home Care/ [] Refused Recommended Disposition /[] [facility]/ []  Follow-up with PCP Additional Notes: Patient is calling very upset that she is still having symptoms- due to severity of the complaint- patient advise to go to ED

## 2022-04-26 ENCOUNTER — Ambulatory Visit: Payer: Medicaid Other | Admitting: Obstetrics

## 2022-06-17 ENCOUNTER — Telehealth: Payer: Medicaid Other | Admitting: Nurse Practitioner

## 2022-06-17 DIAGNOSIS — R0602 Shortness of breath: Secondary | ICD-10-CM

## 2022-06-17 DIAGNOSIS — R52 Pain, unspecified: Secondary | ICD-10-CM

## 2022-06-17 DIAGNOSIS — R059 Cough, unspecified: Secondary | ICD-10-CM | POA: Diagnosis not present

## 2022-06-17 MED ORDER — ALBUTEROL SULFATE HFA 108 (90 BASE) MCG/ACT IN AERS: 2.0000 | INHALATION_SPRAY | Freq: Four times a day (QID) | RESPIRATORY_TRACT | 0 refills | Status: DC | PRN

## 2022-06-17 MED ORDER — PREDNISONE 10 MG (21) PO TBPK: ORAL_TABLET | Freq: Every day | ORAL | 0 refills | Status: DC

## 2022-06-17 NOTE — Progress Notes (Signed)
Virtual Visit Consent   Alexandra Gray, you are scheduled for a virtual visit with Mary-Margaret Daphine Deutscher, FNP, a [facility] provider, today.     Just as with appointments in the office, your consent must be obtained to participate.  Your consent will be active for this visit and any virtual visit you may have with one of our providers in the next 365 days.     If you have a MyChart account, a copy of this consent can be sent to you electronically.  All virtual visits are billed to your insurance company just like a traditional visit in the office.    As this is a virtual visit, video technology does not allow for your provider to perform a traditional examination.  This may limit your provider's ability to fully assess your condition.  If your provider identifies any concerns that need to be evaluated in person or the need to arrange testing (such as labs, EKG, etc.), we will make arrangements to do so.     Although advances in technology are sophisticated, we cannot ensure that it will always work on either your end or our end.  If the connection with a video visit is poor, the visit may have to be switched to a telephone visit.  With either a video or telephone visit, we are not always able to ensure that we have a secure connection.     I need to obtain your verbal consent now.   Are you willing to proceed with your visit today? YES   Alexandra Gray has provided verbal consent on 06/17/2022 for a virtual visit (video or telephone).   Mary-Margaret Daphine Deutscher, FNP   Date: 06/17/2022 10:14 AM   Virtual Visit via Video Note   I, Mary-Margaret Daphine Deutscher, connected with Alexandra Gray ([id number], 1991/10/24) on 06/17/22 at 10:30 AM EDT by a video-enabled telemedicine application and verified that I am speaking with the correct person using two identifiers.  Location: Patient: Virtual Visit Location Patient: Home Provider: Virtual Visit Location Provider: Mobile   I discussed the  limitations of evaluation and management by telemedicine and the availability of in person appointments. The patient expressed understanding and agreed to proceed.    History of Present Illness: Alexandra Gray is a 31 y.o. who identifies as a female who was assigned female at birth, and is being seen today for chest pain.  HPI: Patient says she has episodes where she becomes SOB and lungs feel tight. She has been put on inhalersin th epast. She was told not to go outside in heat. She is only on albuterol and uses it 2x a day for the lat 4 months. She says she gets sob going up and down steps.  URI  The current episode started in the past 7 days. The problem has been waxing and waning. There has been no fever. Associated symptoms include chest pain ("lungs hurt") and coughing. Pertinent negatives include no sinus pain or sneezing. She has tried inhaler use for the symptoms. The treatment provided mild relief.    Review of Systems  HENT:  Negative for sinus pain and sneezing.   Respiratory:  Positive for cough.   Cardiovascular:  Positive for chest pain ("lungs hurt").    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 Please instruct in technique and dispense with spacer, Disp: 18 g, Rfl: 0   amLODipine  (NORVASC) 5 MG tablet, Take 1 tablet (5 mg total) by mouth daily., Disp: 30 tablet, Rfl: 0   clindamycin (CLEOCIN) 150 MG capsule, Take 2 capsules (300 mg total) by mouth every 6 (six) hours. (Patient not taking: Reported on 04/11/2022), Disp: 56 capsule, Rfl: 0   desvenlafaxine (PRISTIQ) 50 MG 24 hr tablet, Take 1 tablet (50 mg total) by mouth daily., Disp: 30 tablet, Rfl: 0   gabapentin (NEURONTIN) 100 MG capsule, Take 1 capsule (100 mg total) by mouth 3 (three) times daily., Disp: 20 capsule, Rfl: 0   HYDROcodone-acetaminophen (NORCO/VICODIN) 5-325 MG tablet, Take 1 tablet by mouth every 6 (six) hours as needed for severe pain., Disp: 4 tablet, Rfl: 0   melatonin 3 MG TABS tablet, Take 1 tablet (3 mg total) by mouth at bedtime. (Patient not taking: Reported on 12/05/2021), Disp: 30 tablet, Rfl: 0   Naproxen 375 MG TBEC, Take 1 tablet (375 mg total) by mouth in the morning and at bedtime., Disp: 60 tablet, Rfl: 0   OXcarbazepine (TRILEPTAL) 150 MG tablet, Take 1 tablet (150 mg total) by mouth 2 (two) times daily., Disp: 60 tablet, Rfl: 0   prazosin (MINIPRESS) 2 MG capsule, Take 1 capsule (2 mg total) by mouth at bedtime., Disp: 30 capsule, Rfl: 0   predniSONE (STERAPRED UNI-PAK 21 TAB) 10 MG (21) TBPK tablet, Take by mouth daily. Take 6 tabs by mouth daily  for 2 days, then 5 tabs for 2 days, then 4 tabs for 2 days, then 3 tabs for 2 days, 2 tabs for 2 days, then 1 tab by mouth daily for 2 days, Disp: 42 tablet, Rfl: 0   QUEtiapine (SEROQUEL) 50 MG tablet, Take 1 tablet (50 mg total) by mouth at bedtime., Disp: 30 tablet, Rfl: 0  Observations/Objective: Patient is well-developed, well-nourished in no acute distress.  Resting comfortably  at home.  Head is normocephalic, atraumatic.  No labored breathing.  Speech is clear and coherent with logical content.  Patient is alert and oriented at baseline.  Raspy voice Seems sob with speaking  Assessment and Plan:  [name] in today with  chief complaint of Chest Pain and URI (/)   1. SOB (shortness of breath) on exertion 2. Pain aggravated by coughing and deep breathing Needs to see PCP ASAP in order to get inhalers for maintenance so that she is not needing albuterol all the time. Avoid excessive heat TO ED if cannot catch her breath  Meds ordered this encounter  Medications   predniSONE (STERAPRED UNI-PAK 21 TAB) 10 MG (21) TBPK tablet    Sig: Take by mouth daily. Take 6 tabs by mouth daily  for 2 days, then 5 tabs for 2 days, then 4 tabs for 2 days, then 3 tabs for 2 days, 2 tabs for 2 days, then 1 tab by mouth daily for 2 days    Dispense:  42  tablet    Refill:  0    Order Specific Question:   Supervising Provider    Answer:   Hyacinth Meeker, BRIAN [[id number]]   albuterol (VENTOLIN HFA) 108 (90 Base) MCG/ACT inhaler    Sig: Inhale 2 puffs into the lungs every 6 (six) hours as needed for wheezing or shortness of breath. Please instruct in technique and dispense with spacer    Dispense:  18 g    Refill:  0    Order Specific Question:   Supervising Provider    Answer:   Eber Hong [[id number]]       Follow Up Instructions: I discussed the assessment and treatment plan with the patient. The patient was provided an opportunity to ask questions and all were answered. The patient agreed with the plan and demonstrated an understanding of the instructions.  A copy of instructions were sent to the patient via MyChart.  The patient was advised to call back or seek an in-person evaluation if the symptoms worsen or if the condition fails to improve as anticipated.  Time:  I spent 8 minutes with the patient via telehealth technology discussing the above problems/concerns.    Mary-Margaret Daphine Deutscher, FNP

## 2022-07-11 ENCOUNTER — Telehealth: Payer: Medicaid Other | Admitting: Physician Assistant

## 2022-07-11 DIAGNOSIS — J069 Acute upper respiratory infection, unspecified: Secondary | ICD-10-CM

## 2022-07-11 MED ORDER — BENZONATATE 100 MG PO CAPS: 100.0000 mg | ORAL_CAPSULE | Freq: Three times a day (TID) | ORAL | 0 refills | Status: AC

## 2022-07-11 MED ORDER — PHENOL 1.4 % MT LIQD: 1.0000 | OROMUCOSAL | 0 refills | Status: DC | PRN

## 2022-07-11 NOTE — Patient Instructions (Signed)
Alexandra Gray, thank you for joining Karrie Meres, PA-C for today's virtual visit.  While this provider is not your primary care provider (PCP), if your PCP is located in our provider database this encounter information will be shared with them immediately following your visit.  Consent: (Patient) Alexandra Gray provided verbal consent for this virtual visit at the beginning of the encounter.  Current Medications:  Current Outpatient Medications:    albuterol (VENTOLIN HFA) 108 (90 Base) MCG/ACT inhaler, Inhale 2 puffs into the lungs every 6 (six) hours as needed for wheezing or shortness of breath. Please instruct in technique and dispense with spacer, Disp: 18 g, Rfl: 0   amLODipine (NORVASC) 5 MG tablet, Take 1 tablet (5 mg total) by mouth daily., Disp: 30 tablet, Rfl: 0   clindamycin (CLEOCIN) 150 MG capsule, Take 2 capsules (300 mg total) by mouth every 6 (six) hours. (Patient not taking: Reported on 04/11/2022), Disp: 56 capsule, Rfl: 0   desvenlafaxine (PRISTIQ) 50 MG 24 hr tablet, Take 1 tablet (50 mg total) by mouth daily., Disp: 30 tablet, Rfl: 0   gabapentin (NEURONTIN) 100 MG capsule, Take 1 capsule (100 mg total) by mouth 3 (three) times daily., Disp: 20 capsule, Rfl: 0   HYDROcodone-acetaminophen (NORCO/VICODIN) 5-325 MG tablet, Take 1 tablet by mouth every 6 (six) hours as needed for severe pain., Disp: 4 tablet, Rfl: 0   melatonin 3 MG TABS tablet, Take 1 tablet (3 mg total) by mouth at bedtime. (Patient not taking: Reported on 12/05/2021), Disp: 30 tablet, Rfl: 0   Naproxen 375 MG TBEC, Take 1 tablet (375 mg total) by mouth in the morning and at bedtime., Disp: 60 tablet, Rfl: 0   OXcarbazepine (TRILEPTAL) 150 MG tablet, Take 1 tablet (150 mg total) by mouth 2 (two) times daily., Disp: 60 tablet, Rfl: 0   prazosin (MINIPRESS) 2 MG capsule, Take 1 capsule (2 mg total) by mouth at bedtime., Disp: 30 capsule, Rfl: 0   predniSONE (STERAPRED UNI-PAK 21 TAB) 10 MG (21) TBPK  tablet, Take by mouth daily. Take 6 tabs by mouth daily  for 2 days, then 5 tabs for 2 days, then 4 tabs for 2 days, then 3 tabs for 2 days, 2 tabs for 2 days, then 1 tab by mouth daily for 2 days, Disp: 42 tablet, Rfl: 0   QUEtiapine (SEROQUEL) 50 MG tablet, Take 1 tablet (50 mg total) by mouth at bedtime., Disp: 30 tablet, Rfl: 0   Medications ordered in this encounter:  No orders of the defined types were placed in this encounter.    *If you need refills on other medications prior to your next appointment, please contact your pharmacy*  Follow-Up: Call back or seek an in-person evaluation if the symptoms worsen or if the condition fails to improve as anticipated.  Other Instructions If you were given a prescription, please take the prescription as you were instructed and follow the directions given on the discharge paperwork.   Over the next several days you should rest as much as possible, and drink more fluids than usual. Liquids will help thin and loosen mucus so you can cough it up. Liquids will also help prevent dehydration. Using a cool mist humidifier or a vaporizer to increase air moisture in your home can also make it easier for you to breathe and help decrease your cough.  To help soothe a sore throat gargle with warm salt water.  Make salt water by dissolving  teaspoon salt in  1 cup warm water. You may also use throat lozenges and over the counter sore throat spray.  Please follow up with your primary care provider within 5-7 days for re-evaluation of your symptoms.  Please seek evaluation at urgent care of the emergency department for any persistent fevers, worsening sore throat/hoarse voice, inability to swallow, persistent vomiting, chest pain, shortness of breath, coughing up blood, or any new or worsening symptoms.    If you have been instructed to have an in-person evaluation today at a local Urgent Care facility, please use the link below. It will take you to a list of  all of our available [facility], including address, phone number and hours of operation. Please do not delay care.  [facility]  If you or a family member do not have a primary care provider, use the link below to schedule a visit and establish care. When you choose a [facility] primary care physician or advanced practice provider, you gain a long-term partner in health. Find a Primary Care Provider  Learn more about [facility] in-office and virtual care options:  - Get Care Now

## 2022-07-11 NOTE — Progress Notes (Signed)
[name], Alexandra Gray are scheduled for a virtual visit with your provider today.    Just as we do with appointments in the office, we must obtain your consent to participate.  Your consent will be active for this visit and any virtual visit you may have with one of our providers in the next 365 days.    If you have a MyChart account, I can also send a copy of this consent to you electronically.  All virtual visits are billed to your insurance company just like a traditional visit in the office.  As this is a virtual visit, video technology does not allow for your provider to perform a traditional examination.  This may limit your provider's ability to fully assess your condition.  If your provider identifies any concerns that need to be evaluated in person or the need to arrange testing such as labs, EKG, etc, we will make arrangements to do so.    Although advances in technology are sophisticated, we cannot ensure that it will always work on either your end or our end.  If the connection with a video visit is poor, we may have to switch to a telephone visit.  With either a video or telephone visit, we are not always able to ensure that we have a secure connection.   I need to obtain your verbal consent now.   Are you willing to proceed with your visit today?   Alexandra Gray has provided verbal consent on 07/11/2022 for a virtual visit (video or telephone).   Karrie Meres, [place] 07/11/2022  6:19 PM   Date:  07/11/2022   ID:  Alexandra Gray, DOB 1991-06-15, MRN [id number]  Patient Location: Home Provider Location: Home Office   Participants: Patient and Provider for Visit and Wrap up  Method of visit: Video  Location of Patient: Home Location of Provider: Home Office Consent was obtain for visit over the video. Services rendered by provider: Visit was performed via video  A video enabled telemedicine application was used and I verified that I am speaking with the correct person  using two identifiers.  PCP:  Fleet Contras, MD   Chief Complaint:  Glenford Peers  History of Present Illness:    Alexandra Gray is a 31 y.o. female with history as stated below. Presents video telehealth for an acute care visit  Pt is c/o a dry cough that started 3 days ago. She reports a burning sore throat and sweats. Reports chest discomfort with coughing only. She has tried benadryl and hot tea without relief.  Denies rhinorrhea, congestion, nv, sob. Denies sick contacts.   Covid test is negative  Past Medical, Surgical, Social History, Allergies, and Medications have been Reviewed.  Past Medical History:  Diagnosis Date   Cesarean delivery delivered 01/04/2019   Chlamydia    Chronic hypertension during pregnancy, antepartum 07/30/2018   BP elevated 12/21, 01/29/18, 05/11/18, 07/29/18  Arly.Keller ] Aspirin 81 mg daily after 12 weeks Current antihypertensives:  None   Baseline and surveillance labs (pulled in from [facility], refresh links as needed)  Lab Results  Component Value Date   PLT 218 07/29/2018   CREATININE 0.59 07/29/2018   AST 16 07/29/2018   ALT 15 07/29/2018              Antenatal Testing CHTN - O10.919  Group I  BP < 140/90, no preecl   Depression    doing ok now   Gestational diabetes    Pt states she  does not have DMII   Gonorrhea    H/O: C-section 01/04/2019   H/O: C-section 01/04/2019   History of cesarean delivery affecting pregnancy 10/03/2015   x1 in 2016, currently plans RLTCS  Declines TOLAC. Form signed 09/20/2018    History of cesarean delivery affecting pregnancy 10/03/2015   x1 in 2016, currently plans RLTCS  Declines TOLAC. Form signed 09/20/2018    History of cesarean delivery affecting pregnancy 10/03/2015   x1 in 2016, currently plans RLTCS  Declines TOLAC. Form signed 09/20/2018    History of pregnancy induced hypertension 12/06/2016   Hx of trichomoniasis    Hypertension    sometimes is up, never on meds   Mental disorder    Ovarian cyst    PID (pelvic inflammatory  disease)    Sleep apnea     Current Meds  Medication Sig   benzonatate (TESSALON) 100 MG capsule Take 1 capsule (100 mg total) by mouth every 8 (eight) hours for 5 days.   phenol (CHLORASEPTIC) 1.4 % LIQD Use as directed 1 spray in the mouth or throat as needed for throat irritation / pain.     Allergies:   Infuvite adult [multiple vitamin] and Nubain [nalbuphine hcl]   ROS See HPI for history of present illness.  Physical Exam Pulmonary:     Comments: Speaking in full sentences            MDM: sxs consistent with viral uri. Symptomatic meds given  There are no diagnoses linked to this encounter.   Time:   Today, I have spent 5 minutes with the patient with telehealth technology discussing the above problems, reviewing the chart, previous notes, medications and orders.    Tests Ordered: No orders of the defined types were placed in this encounter.   Medication Changes: Meds ordered this encounter  Medications   benzonatate (TESSALON) 100 MG capsule    Sig: Take 1 capsule (100 mg total) by mouth every 8 (eight) hours for 5 days.    Dispense:  15 capsule    Refill:  0    Order Specific Question:   Supervising Provider    Answer:   MILLER, BRIAN [[id number]]   phenol (CHLORASEPTIC) 1.4 % LIQD    Sig: Use as directed 1 spray in the mouth or throat as needed for throat irritation / pain.    Dispense:  177 mL    Refill:  0    Order Specific Question:   Supervising Provider    Answer:   Eber Hong [[id number]]     Disposition:  Follow up  Signed, Karrie Meres, PA-C  07/11/2022 6:19 PM

## 2022-07-24 ENCOUNTER — Ambulatory Visit: Payer: Medicaid Other | Admitting: Neurology

## 2022-07-24 ENCOUNTER — Encounter: Payer: Self-pay | Admitting: Neurology

## 2022-07-24 DIAGNOSIS — Z029 Encounter for administrative examinations, unspecified: Secondary | ICD-10-CM

## 2022-08-27 ENCOUNTER — Emergency Department (HOSPITAL_COMMUNITY): Payer: Medicaid Other

## 2022-08-27 ENCOUNTER — Emergency Department (HOSPITAL_COMMUNITY)
Admission: EM | Admit: 2022-08-27 | Discharge: 2022-08-28 | Discharge status: Against Medical Advice | Payer: Medicaid Other | Attending: Student | Admitting: Student

## 2022-08-27 ENCOUNTER — Telehealth: Payer: Medicaid Other | Admitting: Family

## 2022-08-27 DIAGNOSIS — R42 Dizziness and giddiness: Secondary | ICD-10-CM

## 2022-08-27 DIAGNOSIS — R110 Nausea: Secondary | ICD-10-CM | POA: Diagnosis not present

## 2022-08-27 DIAGNOSIS — R519 Headache, unspecified: Secondary | ICD-10-CM | POA: Insufficient documentation

## 2022-08-27 DIAGNOSIS — H538 Other visual disturbances: Secondary | ICD-10-CM

## 2022-08-27 DIAGNOSIS — R29898 Other symptoms and signs involving the musculoskeletal system: Secondary | ICD-10-CM

## 2022-08-27 DIAGNOSIS — Z5321 Procedure and treatment not carried out due to patient leaving prior to being seen by health care provider: Secondary | ICD-10-CM | POA: Insufficient documentation

## 2022-08-27 LAB — CBC WITH DIFFERENTIAL/PLATELET
Abs Immature Granulocytes: 0.05 10*3/uL (ref 0.00–0.07)
Basophils Absolute: 0 10*3/uL (ref 0.0–0.1)
Basophils Relative: 0 %
Eosinophils Absolute: 0.2 10*3/uL (ref 0.0–0.5)
Eosinophils Relative: 2 %
HCT: 44.4 % (ref 36.0–46.0)
Hemoglobin: 14.3 g/dL (ref 12.0–15.0)
Immature Granulocytes: 1 %
Lymphocytes Relative: 29 %
Lymphs Abs: 3 10*3/uL (ref 0.7–4.0)
MCH: 27 pg (ref 26.0–34.0)
MCHC: 32.2 g/dL (ref 30.0–36.0)
MCV: 83.9 fL (ref 80.0–100.0)
Monocytes Absolute: 0.5 10*3/uL (ref 0.1–1.0)
Monocytes Relative: 5 %
Neutro Abs: 6.4 10*3/uL (ref 1.7–7.7)
Neutrophils Relative %: 63 %
Platelets: 339 10*3/uL (ref 150–400)
RBC: 5.29 MIL/uL — ABNORMAL HIGH (ref 3.87–5.11)
RDW: 14.2 % (ref 11.5–15.5)
WBC: 10.1 10*3/uL (ref 4.0–10.5)
nRBC: 0 % (ref 0.0–0.2)

## 2022-08-27 LAB — URINALYSIS, ROUTINE W REFLEX MICROSCOPIC
Bilirubin Urine: NEGATIVE
Glucose, UA: NEGATIVE mg/dL
Hgb urine dipstick: NEGATIVE
Ketones, ur: NEGATIVE mg/dL
Leukocytes,Ua: NEGATIVE
Nitrite: NEGATIVE
Protein, ur: NEGATIVE mg/dL
Specific Gravity, Urine: 1 — ABNORMAL LOW (ref 1.005–1.030)
pH: 7 (ref 5.0–8.0)

## 2022-08-27 LAB — I-STAT BETA HCG BLOOD, ED (MC, WL, AP ONLY): I-stat hCG, quantitative: <5 m[IU]/mL (ref <5)

## 2022-08-27 LAB — BASIC METABOLIC PANEL
Anion gap: 12 (ref 5–15)
BUN: 13 mg/dL (ref 6–20)
CO2: 23 mmol/L (ref 22–32)
Calcium: 9.2 mg/dL (ref 8.9–10.3)
Chloride: 102 mmol/L (ref 98–111)
Creatinine, Ser: 0.82 mg/dL (ref 0.44–1.00)
GFR, Estimated: >60 mL/min (ref >60)
Glucose, Bld: 98 mg/dL (ref 70–99)
Potassium: 3.9 mmol/L (ref 3.5–5.1)
Sodium: 137 mmol/L (ref 135–145)

## 2022-08-27 NOTE — ED Triage Notes (Signed)
Patient complains of dizziness, nausea and headache that started a few days ago. Patient is alert, oriented, and in no apparent distress at this time.

## 2022-08-27 NOTE — ED Provider Triage Note (Signed)
Emergency Medicine Provider Triage Evaluation Note  Alexandra Gray , a 31 y.o. female  was evaluated in triage.  Pt complains of headache and dizziness which began a few days ago.  Patient states the headache is severe.  She describes the headache as feeling her tears all over her head.  She states that she feels the room is spinning around her but also endorses feeling off balance.  The patient endorses nausea but denies vomiting, abdominal pain, shortness of breath at this time.  She denies any injury or trauma to her head  Review of Systems  Positive: As above Negative: As above  Physical Exam  BP (!) 154/99 (BP Location: Right Arm)   Pulse 94   Temp 99.8 F (37.7 C) (Oral)   Resp 18   SpO2 98%  Gen:   Awake, no distress   Resp:  Normal effort  MSK:   Moves extremities without difficulty  Other:    Medical Decision Making  Medically screening exam initiated at 6:10 PM.  Appropriate orders placed.  NOELLA KIPNIS was informed that the remainder of the evaluation will be completed by another provider, this initial triage assessment does not replace that evaluation, and the importance of remaining in the ED until their evaluation is complete.     Dorothyann Peng, PA-C 08/27/22 1810

## 2022-08-27 NOTE — Progress Notes (Signed)
Virtual Visit Consent   [name], you are scheduled for a virtual visit with a [place] provider today. Just as with appointments in the office, your consent must be obtained to participate. Your consent will be active for this visit and any virtual visit you may have with one of our providers in the next 365 days. If you have a MyChart account, a copy of this consent can be sent to you electronically.  As this is a virtual visit, video technology does not allow for your provider to perform a traditional examination. This may limit your provider's ability to fully assess your condition. If your provider identifies any concerns that need to be evaluated in person or the need to arrange testing (such as labs, EKG, etc.), we will make arrangements to do so. Although advances in technology are sophisticated, we cannot ensure that it will always work on either your end or our end. If the connection with a video visit is poor, the visit may have to be switched to a telephone visit. With either a video or telephone visit, we are not always able to ensure that we have a secure connection.  By engaging in this virtual visit, you consent to the provision of healthcare and authorize for your insurance to be billed (if applicable) for the services provided during this visit. Depending on your insurance coverage, you may receive a charge related to this service.  I need to obtain your verbal consent now. Are you willing to proceed with your visit today? Alexandra Gray has provided verbal consent on 08/27/2022 for a virtual visit (video or telephone). [name], FNP  Date: 08/27/2022 3:24 PM  Virtual Visit via Video Note   I, [name], connected with  Alexandra Gray  ([id number], [birth year]) on 08/27/22 at  3:30 PM EDT by a video-enabled telemedicine application and verified that I am speaking with the correct person using two identifiers.  Location: Patient: Virtual Visit Location  Patient: Home Provider: Virtual Visit Location Provider: Home Office   I discussed the limitations of evaluation and management by telemedicine and the availability of in person appointments. The patient expressed understanding and agreed to proceed.    History of Present Illness: Alexandra Gray is a 31 y.o. who identifies as a female who was assigned female at birth, and is being seen today for dizziness and lightheadedness with standing since Thursday. She reports blurred vision, leg weakness, "room spinning", headaches, N&V.    HPI: Dizziness This is a new problem. The current episode started in the past 7 days. The problem occurs intermittently. The problem has been waxing and waning. Associated symptoms include vertigo. Pertinent negatives include no chills or congestion. The symptoms are aggravated by bending and twisting.    Problems:  Patient Active Problem List   Diagnosis Date Noted   Benign essential HTN 10/08/2021   BMI 50.0-59.9, adult (HCC) 05/28/2018   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   PTSD (post-traumatic stress disorder) 02/16/2014   Sleep apnea 02/16/2014   Severe episode of recurrent major depressive disorder, 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    Allergies:  Allergies  Allergen Reactions   Infuvite Adult [Multiple Vitamin] Shortness Of Breath   Nubain [Nalbuphine Hcl] Other (See Comments)    Makes patient hot   Medications:  Current Outpatient Medications:    albuterol (VENTOLIN HFA) 108 (90 Base) MCG/ACT inhaler, Inhale 2 puffs into the lungs every 6 (six) hours as needed for wheezing or shortness of  breath. Please instruct in technique and dispense with spacer, Disp: 18 g, Rfl: 0   amLODipine (NORVASC) 5 MG tablet, Take 1 tablet (5 mg total) by mouth daily., Disp: 30 tablet, Rfl: 0   desvenlafaxine (PRISTIQ) 50 MG 24 hr tablet, Take 1 tablet (50 mg total) by mouth daily., Disp: 30 tablet, Rfl: 0   gabapentin (NEURONTIN) 100 MG  capsule, Take 1 capsule (100 mg total) by mouth 3 (three) times daily., Disp: 20 capsule, Rfl: 0   HYDROcodone-acetaminophen (NORCO/VICODIN) 5-325 MG tablet, Take 1 tablet by mouth every 6 (six) hours as needed for severe pain., Disp: 4 tablet, Rfl: 0   Naproxen 375 MG TBEC, Take 1 tablet (375 mg total) by mouth in the morning and at bedtime., Disp: 60 tablet, Rfl: 0   OXcarbazepine (TRILEPTAL) 150 MG tablet, Take 1 tablet (150 mg total) by mouth 2 (two) times daily., Disp: 60 tablet, Rfl: 0   phenol (CHLORASEPTIC) 1.4 % LIQD, Use as directed 1 spray in the mouth or throat as needed for throat irritation / pain., Disp: 177 mL, Rfl: 0   prazosin (MINIPRESS) 2 MG capsule, Take 1 capsule (2 mg total) by mouth at bedtime., Disp: 30 capsule, Rfl: 0   QUEtiapine (SEROQUEL) 50 MG tablet, Take 1 tablet (50 mg total) by mouth at bedtime., Disp: 30 tablet, Rfl: 0  Observations/Objective: Patient is well-developed, well-nourished in no acute distress.  Resting comfortably  at home.  Head is normocephalic, atraumatic.  No labored breathing.  Speech is clear and coherent with logical content.  Patient is alert and oriented at baseline.    Assessment and Plan: 1. Dizziness  2. Weakness of lower extremity, unspecified laterality  3. Blurred vision  4. Vertigo  Given symptoms, she needs to be seen in person and evaluate to rule out a more serious problems.   Follow Up Instructions: I discussed the assessment and treatment plan with the patient. The patient was provided an opportunity to ask questions and all were answered. The patient agreed with the plan and demonstrated an understanding of the instructions.  A copy of instructions were sent to the patient via MyChart unless otherwise noted below.     The patient was advised to call back or seek an in-person evaluation if the symptoms worsen or if the condition fails to improve as anticipated.  Time:  I spent 8 minutes with the patient via  telehealth technology discussing the above problems/concerns.    [name], FNP

## 2022-08-28 ENCOUNTER — Other Ambulatory Visit: Payer: Self-pay

## 2022-08-28 ENCOUNTER — Emergency Department (HOSPITAL_BASED_OUTPATIENT_CLINIC_OR_DEPARTMENT_OTHER)
Admission: EM | Admit: 2022-08-28 | Discharge: 2022-08-28 | Disposition: A | Discharge status: Home / Self Care | Payer: Medicaid Other | Source: Home / Self Care | Attending: Emergency Medicine | Admitting: Emergency Medicine

## 2022-08-28 ENCOUNTER — Encounter (HOSPITAL_BASED_OUTPATIENT_CLINIC_OR_DEPARTMENT_OTHER): Payer: Self-pay | Admitting: Emergency Medicine

## 2022-08-28 DIAGNOSIS — R42 Dizziness and giddiness: Secondary | ICD-10-CM | POA: Insufficient documentation

## 2022-08-28 MED ORDER — MECLIZINE HCL 25 MG PO TABS: 25.0000 mg | ORAL_TABLET | Freq: Three times a day (TID) | ORAL | 0 refills | Status: DC | PRN

## 2022-08-28 MED ORDER — MECLIZINE HCL 25 MG PO TABS
25.0000 mg | ORAL_TABLET | Freq: Once | ORAL | Status: AC
  Administered 2022-08-28: 25 mg via ORAL
  Filled 2022-08-28: qty 1

## 2022-08-28 NOTE — ED Triage Notes (Signed)
Reports dizziness with nausea and headache for the last five days.  Taking tylenol with no relief.
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
## 2022-08-28 NOTE — ED Notes (Signed)
Pt ambulated well to room from triage, no dizziness noted

## 2022-09-04 ENCOUNTER — Ambulatory Visit: Payer: Medicaid Other | Admitting: Neurology

## 2022-09-27 ENCOUNTER — Emergency Department (HOSPITAL_COMMUNITY): Payer: Medicaid Other

## 2022-09-27 ENCOUNTER — Emergency Department (HOSPITAL_COMMUNITY)
Admission: EM | Admit: 2022-09-27 | Discharge: 2022-09-27 | Disposition: A | Discharge status: Home / Self Care | Payer: Medicaid Other | Attending: Emergency Medicine | Admitting: Emergency Medicine

## 2022-09-27 ENCOUNTER — Encounter (HOSPITAL_COMMUNITY): Payer: Self-pay | Admitting: Emergency Medicine

## 2022-09-27 DIAGNOSIS — S93402A Sprain of unspecified ligament of left ankle, initial encounter: Secondary | ICD-10-CM | POA: Insufficient documentation

## 2022-09-27 DIAGNOSIS — S8392XA Sprain of unspecified site of left knee, initial encounter: Secondary | ICD-10-CM | POA: Diagnosis not present

## 2022-09-27 DIAGNOSIS — W010XXA Fall on same level from slipping, tripping and stumbling without subsequent striking against object, initial encounter: Secondary | ICD-10-CM | POA: Insufficient documentation

## 2022-09-27 DIAGNOSIS — M25562 Pain in left knee: Secondary | ICD-10-CM | POA: Diagnosis present

## 2022-09-27 DIAGNOSIS — W19XXXA Unspecified fall, initial encounter: Secondary | ICD-10-CM

## 2022-09-27 MED ORDER — DICLOFENAC SODIUM 75 MG PO TBEC: 75.0000 mg | DELAYED_RELEASE_TABLET | Freq: Two times a day (BID) | ORAL | 0 refills | Status: DC

## 2022-09-27 NOTE — Discharge Instructions (Addendum)
Return if any problems. Schedule to see  the Orthopaedist for recheck.  

## 2022-09-27 NOTE — ED Provider Triage Note (Signed)
Emergency Medicine Provider Triage Evaluation Note  Alexandra Gray , a 31 y.o. female  was evaluated in triage.  Pt complains of left knee pain, left ankle pain, left foot pain.  Patient reports that 2 weeks ago she tripped on a step.  Patient denies hitting her head or losing consciousness.  The patient states that ever since this time she has been having pain in her left knee, left ankle.  Patient has intact range of motion to ankle however does have decreased range of motion to left knee secondary to pain.  No deformity.  Review of Systems  Positive:  Negative:   Physical Exam  BP (!) 154/102 (BP Location: Right Arm)   Pulse 95   Temp 99.4 F (37.4 C)   Resp 16   LMP 07/26/2022   SpO2 97%  Gen:   Awake, no distress   Resp:  Normal effort  MSK:   Moves extremities without difficulty  Other:    Medical Decision Making  Medically screening exam initiated at 1:52 PM.  Appropriate orders placed.  Alexandra Gray was informed that the remainder of the evaluation will be completed by another provider, this initial triage assessment does not replace that evaluation, and the importance of remaining in the ED until their evaluation is complete.     Azucena Cecil, PA-C 09/27/22 1352

## 2022-09-27 NOTE — ED Triage Notes (Signed)
Patient here with complaint of left foot and ankle pain that started two weeks ago when she tripped on a step. Patient is alert, oriented, and in no apparent distress at this time.

## 2022-09-27 NOTE — ED Provider Notes (Signed)
[facility] EMERGENCY DEPARTMENT Provider Note   CSN: [id number] Arrival date & time: 09/27/22  1255     History  Chief Complaint  Patient presents with   Alexandra Gray    Alexandra Gray is a 31 y.o. female.  Pt reports she tripped and fell 2 weeks ago.  Pt reports she has had pain in her left knee, left foot and left ankle since.  Pt reports difficulty walking.    The history is provided by the patient. No language interpreter was used.  Fall This is a new problem. The problem occurs constantly.       Home Medications Prior to Admission medications   Medication Sig Start Date End Date Taking? Authorizing Provider  diclofenac (VOLTAREN) 75 MG EC tablet Take 1 tablet (75 mg total) by mouth 2 (two) times daily. 09/27/22  Yes Cheron Schaumann K, PA-C  albuterol (VENTOLIN HFA) 108 (90 Base) MCG/ACT inhaler Inhale 2 puffs into the lungs every 6 (six) hours as needed for wheezing or shortness of breath. Please instruct in technique and dispense with spacer 06/17/22   Daphine Deutscher, Mary-Margaret, FNP  amLODipine (NORVASC) 5 MG tablet Take 1 tablet (5 mg total) by mouth daily. 12/17/21 04/11/22  Princess Bruins, DO  desvenlafaxine (PRISTIQ) 50 MG 24 hr tablet Take 1 tablet (50 mg total) by mouth daily. 12/17/21 04/11/22  Princess Bruins, DO  gabapentin (NEURONTIN) 100 MG capsule Take 1 capsule (100 mg total) by mouth 3 (three) times daily. 04/11/22   Fayrene Helper, PA-C  HYDROcodone-acetaminophen (NORCO/VICODIN) 5-325 MG tablet Take 1 tablet by mouth every 6 (six) hours as needed for severe pain. 02/16/22   Renne Crigler, PA-C  meclizine (ANTIVERT) 25 MG tablet Take 1 tablet (25 mg total) by mouth 3 (three) times daily as needed for dizziness. 08/28/22   Pollyann Savoy, MD  Naproxen 375 MG TBEC Take 1 tablet (375 mg total) by mouth in the morning and at bedtime. 01/10/22   [name], NP  OXcarbazepine (TRILEPTAL) 150 MG tablet Take 1 tablet (150 mg total) by mouth 2 (two) times daily.  12/16/21 04/11/22  Princess Bruins, DO  phenol (CHLORASEPTIC) 1.4 % LIQD Use as directed 1 spray in the mouth or throat as needed for throat irritation / pain. 07/11/22   Couture, Cortni S, PA-C  prazosin (MINIPRESS) 2 MG capsule Take 1 capsule (2 mg total) by mouth at bedtime. 12/16/21 04/11/22  Princess Bruins, DO  QUEtiapine (SEROQUEL) 50 MG tablet Take 1 tablet (50 mg total) by mouth at bedtime. 12/16/21 04/11/22  Princess Bruins, DO      Allergies    Infuvite adult [multiple vitamin] and Nubain [nalbuphine hcl]    Review of Systems   Review of Systems  Physical Exam Updated Vital Signs BP 125/83 (BP Location: Right Arm)   Pulse 86   Temp 99 F (37.2 C) (Oral)   Resp 16   LMP 07/26/2022   SpO2 100%  Physical Exam Vitals and nursing note reviewed.  Constitutional:      Appearance: She is well-developed.  HENT:     Head: Normocephalic.  Cardiovascular:     Rate and Rhythm: Normal rate.  Pulmonary:     Effort: Pulmonary effort is normal.  Abdominal:     General: There is no distension.  Musculoskeletal:        General: Swelling and tenderness present. Normal range of motion.     Cervical back: Normal range of motion.     Comments: Tender left  foot and ankle,  pain with movement,  pain with movement left knee   Skin:    General: Skin is warm.  Neurological:     General: No focal deficit present.     Mental Status: She is alert and oriented to person, place, and time.     ED Results / Procedures / Treatments   Labs (all labs ordered are listed, but only abnormal results are displayed) Labs Reviewed - No data to display  EKG None  Radiology DG Ankle Complete Left  Result Date: 09/27/2022 CLINICAL DATA:  Fall, pain EXAM: LEFT ANKLE COMPLETE - 3+ VIEW COMPARISON:  09/27/2022 FINDINGS: There is no evidence of fracture, dislocation, or joint effusion. There is no evidence of arthropathy or other focal bone abnormality. Soft tissues are unremarkable. IMPRESSION: No acute  abnormality Electronically Signed   By: Jerilynn Mages.  Shick M.D.   On: 09/27/2022 14:38   DG Foot Complete Left  Result Date: 09/27/2022 CLINICAL DATA:  Fall, persistent pain EXAM: LEFT FOOT - COMPLETE 3+ VIEW COMPARISON:  05/08/2020 FINDINGS: There is no evidence of fracture or dislocation. There is no evidence of arthropathy or other focal bone abnormality. Soft tissues are unremarkable. IMPRESSION: No acute abnormality Electronically Signed   By: Jerilynn Mages.  Shick M.D.   On: 09/27/2022 14:37   DG Knee Complete 4 Views Left  Result Date: 09/27/2022 CLINICAL DATA:  Fall, pain EXAM: LEFT KNEE - COMPLETE 4+ VIEW COMPARISON:  05/08/2020 FINDINGS: Normal alignment without acute osseous finding, fracture, or effusion. Minor medial compartment degenerative change with bony spurring. Obese body habitus. IMPRESSION: No acute abnormality.  Stable exam. Electronically Signed   By: Jerilynn Mages.  Shick M.D.   On: 09/27/2022 14:36    Procedures Procedures    Medications Ordered in ED Medications - No data to display  ED Course/ Medical Decision Making/ A&P                           Medical Decision Making Pt reports she fell 2 weeks ago.    Amount and/or Complexity of Data Reviewed Labs: ordered. Decision-making details documented in ED Course. Radiology: ordered and independent interpretation performed.    Details: Xray left  foot, ankle and knee  no fracture    Risk Prescription drug management. Risk Details: Pt given crutches and placed in an ace wrap.  Pt advised to follow up with ENT for recheck            Final Clinical Impression(s) / ED Diagnoses Final diagnoses:  Fall, initial encounter  Sprain of left ankle, unspecified ligament, initial encounter  Sprain of left knee, unspecified ligament, initial encounter    Rx / DC Orders ED Discharge Orders          Ordered    diclofenac (VOLTAREN) 75 MG EC tablet  2 times daily        09/27/22 1550           An After Visit Summary was printed and  given to the patient.     Fransico Meadow, [place] 09/27/22 1731    Godfrey Pick, MD 09/28/22 [phone]

## 2022-09-27 NOTE — ED Notes (Signed)
Patient in xray 

## 2022-09-27 NOTE — ED Notes (Signed)
Pt left knee and ankle wrapped with an ACE wrap at this time. Pt Crutches adjusted and pt demonstrated appropriate use.

## 2022-10-04 ENCOUNTER — Ambulatory Visit: Payer: Self-pay | Admitting: Psychiatry

## 2022-10-04 ENCOUNTER — Encounter: Payer: Self-pay | Admitting: Psychiatry
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
## 2022-12-10 ENCOUNTER — Emergency Department (HOSPITAL_COMMUNITY): Payer: Medicaid Other

## 2022-12-10 ENCOUNTER — Other Ambulatory Visit: Payer: Self-pay

## 2022-12-10 ENCOUNTER — Emergency Department (HOSPITAL_COMMUNITY)
Admission: EM | Admit: 2022-12-10 | Discharge: 2022-12-10 | Disposition: A | Discharge status: Home / Self Care | Payer: Medicaid Other | Attending: Emergency Medicine | Admitting: Emergency Medicine

## 2022-12-10 DIAGNOSIS — S0990XA Unspecified injury of head, initial encounter: Secondary | ICD-10-CM | POA: Diagnosis not present

## 2022-12-10 DIAGNOSIS — S0591XA Unspecified injury of right eye and orbit, initial encounter: Secondary | ICD-10-CM | POA: Diagnosis present

## 2022-12-10 DIAGNOSIS — S02831A Fracture of medial orbital wall, right side, initial encounter for closed fracture: Secondary | ICD-10-CM | POA: Insufficient documentation

## 2022-12-10 DIAGNOSIS — I10 Essential (primary) hypertension: Secondary | ICD-10-CM | POA: Diagnosis not present

## 2022-12-10 DIAGNOSIS — M79604 Pain in right leg: Secondary | ICD-10-CM | POA: Insufficient documentation

## 2022-12-10 LAB — COMPREHENSIVE METABOLIC PANEL
ALT: 22 U/L (ref 0–44)
AST: 19 U/L (ref 15–41)
Albumin: 3.7 g/dL (ref 3.5–5.0)
Alkaline Phosphatase: 86 U/L (ref 38–126)
Anion gap: 9 (ref 5–15)
BUN: 10 mg/dL (ref 6–20)
CO2: 26 mmol/L (ref 22–32)
Calcium: 8.9 mg/dL (ref 8.9–10.3)
Chloride: 102 mmol/L (ref 98–111)
Creatinine, Ser: 0.77 mg/dL (ref 0.44–1.00)
GFR, Estimated: >60 mL/min (ref >60)
Glucose, Bld: 105 mg/dL — ABNORMAL HIGH (ref 70–99)
Potassium: 3.4 mmol/L — ABNORMAL LOW (ref 3.5–5.1)
Sodium: 137 mmol/L (ref 135–145)
Total Bilirubin: 0.7 mg/dL (ref 0.3–1.2)
Total Protein: 7.6 g/dL (ref 6.5–8.1)

## 2022-12-10 LAB — PREGNANCY, URINE: Preg Test, Ur: NEGATIVE

## 2022-12-10 LAB — CBC
HCT: 48.1 % — ABNORMAL HIGH (ref 36.0–46.0)
Hemoglobin: 15.3 g/dL — ABNORMAL HIGH (ref 12.0–15.0)
MCH: 25.8 pg — ABNORMAL LOW (ref 26.0–34.0)
MCHC: 31.8 g/dL (ref 30.0–36.0)
MCV: 81.1 fL (ref 80.0–100.0)
Platelets: 315 10*3/uL (ref 150–400)
RBC: 5.93 MIL/uL — ABNORMAL HIGH (ref 3.87–5.11)
RDW: 14.4 % (ref 11.5–15.5)
WBC: 12.1 10*3/uL — ABNORMAL HIGH (ref 4.0–10.5)
nRBC: 0 % (ref 0.0–0.2)

## 2022-12-10 LAB — CK: Total CK: 154 U/L (ref 38–234)

## 2022-12-10 MED ORDER — TETRACAINE HCL 0.5 % OP SOLN
1.0000 [drp] | Freq: Once | OPHTHALMIC | Status: AC
  Administered 2022-12-10: 1 [drp] via OPHTHALMIC
  Filled 2022-12-10: qty 4

## 2022-12-10 MED ORDER — OXYCODONE HCL 5 MG PO TABS: 5.0000 mg | ORAL_TABLET | Freq: Four times a day (QID) | ORAL | 0 refills | Status: DC | PRN

## 2022-12-10 MED ORDER — LACTATED RINGERS IV BOLUS: 1000.0000 mL | Freq: Once | INTRAVENOUS | Status: DC

## 2022-12-10 MED ORDER — FENTANYL CITRATE PF 50 MCG/ML IJ SOSY
50.0000 ug | PREFILLED_SYRINGE | Freq: Once | INTRAMUSCULAR | Status: DC
  Filled 2022-12-10: qty 1

## 2022-12-10 MED ORDER — NAPROXEN 500 MG PO TABS: 500.0000 mg | ORAL_TABLET | Freq: Two times a day (BID) | ORAL | 0 refills | Status: DC

## 2022-12-10 MED ORDER — KETOROLAC TROMETHAMINE 15 MG/ML IJ SOLN
15.0000 mg | Freq: Once | INTRAMUSCULAR | Status: AC
  Administered 2022-12-10: 15 mg via INTRAMUSCULAR

## 2022-12-10 MED ORDER — HOMATROPINE HBR 5 % OP SOLN: 1.0000 [drp] | Freq: Three times a day (TID) | OPHTHALMIC | 0 refills | Status: DC | PRN

## 2022-12-10 MED ORDER — KETOROLAC TROMETHAMINE 15 MG/ML IJ SOLN
15.0000 mg | Freq: Once | INTRAMUSCULAR | Status: DC
  Filled 2022-12-10: qty 1

## 2022-12-10 MED ORDER — FENTANYL CITRATE PF 50 MCG/ML IJ SOSY
50.0000 ug | PREFILLED_SYRINGE | Freq: Once | INTRAMUSCULAR | Status: AC
  Administered 2022-12-10: 50 ug via INTRAMUSCULAR

## 2022-12-10 MED ORDER — OXYMETAZOLINE HCL 0.05 % NA SOLN: 1.0000 | Freq: Two times a day (BID) | NASAL | 0 refills | Status: DC | PRN

## 2022-12-10 MED ORDER — FLUORESCEIN SODIUM 1 MG OP STRP
1.0000 | ORAL_STRIP | Freq: Once | OPHTHALMIC | Status: AC
  Administered 2022-12-10: 1 via OPHTHALMIC
  Filled 2022-12-10: qty 1
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
## 2022-12-10 NOTE — Progress Notes (Signed)
Orthopedic Tech Progress Note Patient Details:  Alexandra Gray 1990/12/28 [id number]  Ortho Devices Type of Ortho Device: Crutches Ortho Device/Splint Interventions: Ordered, Application, Adjustment  The patient already knew how to use crutches. They got up and walked for me. Post Interventions Patient Tolerated: Well Instructions Provided: Care of device, Adjustment of device  Karolee Stamps 12/10/2022, 11:50 PM
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
## 2022-12-22 ENCOUNTER — Telehealth: Payer: Medicaid Other | Admitting: Physician Assistant

## 2022-12-22 DIAGNOSIS — H538 Other visual disturbances: Secondary | ICD-10-CM

## 2022-12-23 NOTE — Progress Notes (Signed)
Encouraged to follow up with opthalmology as soon as possible.  No evisit charged

## 2023-01-06 ENCOUNTER — Telehealth: Payer: Medicaid Other | Admitting: Nurse Practitioner

## 2023-01-06 DIAGNOSIS — K0889 Other specified disorders of teeth and supporting structures: Secondary | ICD-10-CM

## 2023-01-07 MED ORDER — PENICILLIN V POTASSIUM 500 MG PO TABS: 500.0000 mg | ORAL_TABLET | Freq: Three times a day (TID) | ORAL | 0 refills | Status: AC

## 2023-01-07 MED ORDER — NAPROXEN 500 MG PO TABS: 500.0000 mg | ORAL_TABLET | Freq: Two times a day (BID) | ORAL | 0 refills | Status: DC

## 2023-01-07 NOTE — Progress Notes (Signed)
I have spent 5 minutes in review of e-visit questionnaire, review and updating patient chart, medical decision making and response to patient.  ° °Giulliana Mcroberts W Khalee Mazo, NP ° °  °

## 2023-01-07 NOTE — Progress Notes (Signed)

## 2023-01-09 ENCOUNTER — Telehealth: Payer: Medicaid Other | Admitting: Nurse Practitioner

## 2023-01-09 DIAGNOSIS — J40 Bronchitis, not specified as acute or chronic: Secondary | ICD-10-CM

## 2023-01-09 MED ORDER — BENZONATATE 100 MG PO CAPS: 100.0000 mg | ORAL_CAPSULE | Freq: Three times a day (TID) | ORAL | 0 refills | Status: DC | PRN

## 2023-01-09 MED ORDER — ALBUTEROL SULFATE HFA 108 (90 BASE) MCG/ACT IN AERS: 2.0000 | INHALATION_SPRAY | Freq: Four times a day (QID) | RESPIRATORY_TRACT | 0 refills | Status: DC | PRN

## 2023-01-09 MED ORDER — AZITHROMYCIN 250 MG PO TABS: ORAL_TABLET | ORAL | 0 refills | Status: AC

## 2023-01-09 NOTE — Progress Notes (Signed)
We are sorry that you are not feeling well.  Here is how we plan to help!  Based on your presentation I believe you most likely have A cough due to bacteria.  When patients have a fever and a productive cough with a change in color or increased sputum production, we are concerned about bacterial bronchitis.  If left untreated it can progress to pneumonia.  If your symptoms do not improve with your treatment plan it is important that you contact your provider.   I have prescribed Azithromyin 250 mg: two tablets now and then one tablet daily for 4 additonal days    In addition you may use A prescription cough medication called Tessalon Perles 172m. You may take 1-2 capsules every 8 hours as needed for your cough.  We will also refill your Albuterol inhaler to assure you have enough to support you through this illness. Continue to use this up to every 4-6 hours as needed.   From your responses in the eVisit questionnaire you describe inflammation in the upper respiratory tract which is causing a significant cough.  This is commonly called Bronchitis and has four common causes:   Allergies Viral Infections Acid Reflux Bacterial Infection Allergies, viruses and acid reflux are treated by controlling symptoms or eliminating the cause. An example might be a cough caused by taking certain blood pressure medications. You stop the cough by changing the medication. Another example might be a cough caused by acid reflux. Controlling the reflux helps control the cough.  USE OF BRONCHODILATOR ("RESCUE") INHALERS: There is a risk from using your bronchodilator too frequently.  The risk is that over-reliance on a medication which only relaxes the muscles surrounding the breathing tubes can reduce the effectiveness of medications prescribed to reduce swelling and congestion of the tubes themselves.  Although you feel brief relief from the bronchodilator inhaler, your asthma may actually be worsening with the tubes  becoming more swollen and filled with mucus.  This can delay other crucial treatments, such as oral steroid medications. If you need to use a bronchodilator inhaler daily, several times per day, you should discuss this with your provider.  There are probably better treatments that could be used to keep your asthma under control.     HOME CARE Only take medications as instructed by your medical team. Complete the entire course of an antibiotic. Drink plenty of fluids and get plenty of rest. Avoid close contacts especially the very young and the elderly Cover your mouth if you cough or cough into your sleeve. Always remember to wash your hands A steam or ultrasonic humidifier can help congestion.   GET HELP RIGHT AWAY IF: You develop worsening fever. You become short of breath You cough up blood. Your symptoms persist after you have completed your treatment plan MAKE SURE YOU  Understand these instructions. Will watch your condition. Will get help right away if you are not doing well or get worse.    Thank you for choosing an e-visit.  Your e-visit answers were reviewed by a board certified advanced clinical practitioner to complete your personal care plan. Depending upon the condition, your plan could have included both over the counter or prescription medications.  Please review your pharmacy choice. Make sure the pharmacy is open so you can pick up prescription now. If there is a problem, you may contact your provider through [employer] have the prescription routed to another pharmacy.  Your safety is important to [place] If you have  drug allergies check your prescription carefully.   For the next 24 hours you can use MyChart to ask questions about today's visit, request a non-urgent call back, or ask for a work or school excuse. You will get an email in the next two days asking about your experience. I hope that your e-visit has been valuable and will speed your recovery.    Meds ordered this encounter  Medications   azithromycin (ZITHROMAX) 250 MG tablet    Sig: Take 2 tablets on day 1, then 1 tablet daily on days 2 through 5    Dispense:  6 tablet    Refill:  0   benzonatate (TESSALON) 100 MG capsule    Sig: Take 1 capsule (100 mg total) by mouth 3 (three) times daily as needed.    Dispense:  30 capsule    Refill:  0   albuterol (VENTOLIN HFA) 108 (90 Base) MCG/ACT inhaler    Sig: Inhale 2 puffs into the lungs every 6 (six) hours as needed for wheezing or shortness of breath.    Dispense:  8 g    Refill:  0    I spent approximately 5 minutes reviewing the patient's history, current symptoms and coordinating their care today.

## 2023-02-13 ENCOUNTER — Telehealth: Payer: Medicaid Other | Admitting: Physician Assistant

## 2023-02-13 DIAGNOSIS — M542 Cervicalgia: Secondary | ICD-10-CM

## 2023-02-13 DIAGNOSIS — M62838 Other muscle spasm: Secondary | ICD-10-CM | POA: Diagnosis not present

## 2023-02-13 MED ORDER — TIZANIDINE HCL 2 MG PO TABS: 2.0000 mg | ORAL_TABLET | Freq: Three times a day (TID) | ORAL | 0 refills | Status: DC | PRN

## 2023-02-13 MED ORDER — PREDNISONE 10 MG (21) PO TBPK: ORAL_TABLET | ORAL | 0 refills | Status: DC

## 2023-02-13 NOTE — Patient Instructions (Signed)
Alexandra Gray, thank you for joining Leeanne Rio, PA-C for today's virtual visit.  While this provider is not your primary care provider (PCP), if your PCP is located in our provider database this encounter information will be shared with them immediately following your visit.   [facility] account gives you access to today's visit and all your visits, tests, and labs performed at [facility] " click here if you don't have a [facility] account or go to [email or web address]  Consent: (Patient) Alexandra Gray provided verbal consent for this virtual visit at the beginning of the encounter.  Current Medications:  Current Outpatient Medications:    albuterol (VENTOLIN HFA) 108 (90 Base) MCG/ACT inhaler, Inhale 2 puffs into the lungs every 6 (six) hours as needed for wheezing or shortness of breath. Please instruct in technique and dispense with spacer, Disp: 18 g, Rfl: 0   albuterol (VENTOLIN HFA) 108 (90 Base) MCG/ACT inhaler, Inhale 2 puffs into the lungs every 6 (six) hours as needed for wheezing or shortness of breath., Disp: 8 g, Rfl: 0   amLODipine (NORVASC) 5 MG tablet, Take 1 tablet (5 mg total) by mouth daily., Disp: 30 tablet, Rfl: 0   benzonatate (TESSALON) 100 MG capsule, Take 1 capsule (100 mg total) by mouth 3 (three) times daily as needed., Disp: 30 capsule, Rfl: 0   desvenlafaxine (PRISTIQ) 50 MG 24 hr tablet, Take 1 tablet (50 mg total) by mouth daily., Disp: 30 tablet, Rfl: 0   gabapentin (NEURONTIN) 100 MG capsule, Take 1 capsule (100 mg total) by mouth 3 (three) times daily., Disp: 20 capsule, Rfl: 0   homatropine 5 % ophthalmic solution, Place 1 drop into the right eye 3 (three) times daily as needed., Disp: 5 mL, Rfl: 0   HYDROcodone-acetaminophen (NORCO/VICODIN) 5-325 MG tablet, Take 1 tablet by mouth every 6 (six) hours as needed for severe pain., Disp: 4 tablet, Rfl: 0   meclizine (ANTIVERT) 25 MG tablet, Take 1 tablet (25  mg total) by mouth 3 (three) times daily as needed for dizziness., Disp: 30 tablet, Rfl: 0   naproxen (NAPROSYN) 500 MG tablet, Take 1 tablet (500 mg total) by mouth 2 (two) times daily., Disp: 30 tablet, Rfl: 0   OXcarbazepine (TRILEPTAL) 150 MG tablet, Take 1 tablet (150 mg total) by mouth 2 (two) times daily., Disp: 60 tablet, Rfl: 0   oxyCODONE (ROXICODONE) 5 MG immediate release tablet, Take 1 tablet (5 mg total) by mouth every 6 (six) hours as needed for severe pain or breakthrough pain., Disp: 30 tablet, Rfl: 0   oxymetazoline (AFRIN NASAL SPRAY) 0.05 % nasal spray, Place 1 spray into both nostrils 2 (two) times daily as needed for congestion (for nosebleeds)., Disp: 30 mL, Rfl: 0   phenol (CHLORASEPTIC) 1.4 % LIQD, Use as directed 1 spray in the mouth or throat as needed for throat irritation / pain., Disp: 177 mL, Rfl: 0   prazosin (MINIPRESS) 2 MG capsule, Take 1 capsule (2 mg total) by mouth at bedtime., Disp: 30 capsule, Rfl: 0   QUEtiapine (SEROQUEL) 50 MG tablet, Take 1 tablet (50 mg total) by mouth at bedtime., Disp: 30 tablet, Rfl: 0   Medications ordered in this encounter:  No orders of the defined types were placed in this encounter.    *If you need refills on other medications prior to your next appointment, please contact your pharmacy*  Follow-Up: Call back or seek an in-person evaluation if the symptoms worsen or  if the condition fails to improve as anticipated.  [place] [phone]  Other Instructions Please alternate ice and heat to the neck as discussed. Okay to utilize over-the-counter Tylenol. Take the prednisone as directed. Use the muscle relaxant (tizanidine) as directed when needed.  Be aware this may make you sleepy. Try to avoid any heavy lifting or overhead lifting. Symptoms should start to substantially improve over the next 24 hours. If not, or if anything worsens, you need to be evaluated in person ASAP.  Please do not delay  care.   If you have been instructed to have an in-person evaluation today at a local Urgent Care facility, please use the link below. It will take you to a list of all of our available [facility], including address, phone number and hours of operation. Please do not delay care.  [facility]  If you or a family member do not have a primary care provider, use the link below to schedule a visit and establish care. When you choose a [facility] primary care physician or advanced practice provider, you gain a long-term partner in health. Find a Primary Care Provider  Learn more about [facility]'s in-office and virtual care options: [facility]

## 2023-02-13 NOTE — Progress Notes (Signed)
Virtual Visit Consent   Alexandra Gray, you are scheduled for a virtual visit with a [facility] provider today. Just as with appointments in the office, your consent must be obtained to participate. Your consent will be active for this visit and any virtual visit you may have with one of our providers in the next 365 days. If you have a MyChart account, a copy of this consent can be sent to you electronically.  As this is a virtual visit, video technology does not allow for your provider to perform a traditional examination. This may limit your provider's ability to fully assess your condition. If your provider identifies any concerns that need to be evaluated in person or the need to arrange testing (such as labs, EKG, etc.), we will make arrangements to do so. Although advances in technology are sophisticated, we cannot ensure that it will always work on either your end or our end. If the connection with a video visit is poor, the visit may have to be switched to a telephone visit. With either a video or telephone visit, we are not always able to ensure that we have a secure connection.  By engaging in this virtual visit, you consent to the provision of healthcare and authorize for your insurance to be billed (if applicable) for the services provided during this visit. Depending on your insurance coverage, you may receive a charge related to this service.  I need to obtain your verbal consent now. Are you willing to proceed with your visit today? Alexandra Gray has provided verbal consent on 02/13/2023 for a virtual visit (video or telephone). Leeanne Rio, [place]  Date: 02/13/2023 4:44 PM  Virtual Visit via Video Note   I, Leeanne Rio, connected with  Alexandra Gray  ([id number], 1990-12-11) on 02/13/23 at  4:30 PM EDT by a video-enabled telemedicine application and verified that I am speaking with the correct person using two identifiers.  Location: Patient: Virtual Visit  Location Patient: Home Provider: Virtual Visit Location Provider: Home Office   I discussed the limitations of evaluation and management by telemedicine and the availability of in person appointments. The patient expressed understanding and agreed to proceed.    History of Present Illness: Alexandra Gray is a 32 y.o. who identifies as a female who was assigned female at birth, and is being seen today for couple days of progressively worsening tension, tightness and discomfort in her left neck, mainly side and posteriorly.  Denies fever or chills.  Denies any pain with range of motion of her jaw.  Notes a knot has formed it feels very tender and tight.  Is not warm to touch.  Notes pain with range of motion, associated with some shoulder tension/tightness and mild headache that wraps around her head.  Denies any known trauma or injury.  Denies sleeping wrong.  Does have history of dental infections but denies any pain or swelling of her left upper teeth or gums.  Denies any sinus tenderness.  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[place]) 02/16/2014   PTSD (post-traumatic stress disorder) 02/16/2014   Sleep apnea 02/16/2014   Severe episode of recurrent major depressive disorder, without psychotic features ([place]) 08/04/2013    Allergies:  Allergies  Allergen Reactions   Infuvite Adult [Multiple Vitamin] Shortness Of Breath   Nubain [Nalbuphine Hcl]  Other (See Comments)    Makes patient hot   Medications:  Current Outpatient Medications:    predniSONE (STERAPRED UNI-PAK 21 TAB) 10 MG (21) TBPK tablet, Take following package directions, Disp: 21 tablet, Rfl: 0   tiZANidine (ZANAFLEX) 2 MG tablet, Take 1 tablet (2 mg total) by mouth every 8 (eight) hours as needed for muscle spasms., Disp: 15 tablet, Rfl: 0   albuterol (VENTOLIN HFA) 108 (90 Base)  MCG/ACT inhaler, Inhale 2 puffs into the lungs every 6 (six) hours as needed for wheezing or shortness of breath. Please instruct in technique and dispense with spacer, Disp: 18 g, Rfl: 0   albuterol (VENTOLIN HFA) 108 (90 Base) MCG/ACT inhaler, Inhale 2 puffs into the lungs every 6 (six) hours as needed for wheezing or shortness of breath., Disp: 8 g, Rfl: 0   amLODipine (NORVASC) 5 MG tablet, Take 1 tablet (5 mg total) by mouth daily., Disp: 30 tablet, Rfl: 0   benzonatate (TESSALON) 100 MG capsule, Take 1 capsule (100 mg total) by mouth 3 (three) times daily as needed., Disp: 30 capsule, Rfl: 0   desvenlafaxine (PRISTIQ) 50 MG 24 hr tablet, Take 1 tablet (50 mg total) by mouth daily., Disp: 30 tablet, Rfl: 0   gabapentin (NEURONTIN) 100 MG capsule, Take 1 capsule (100 mg total) by mouth 3 (three) times daily., Disp: 20 capsule, Rfl: 0   homatropine 5 % ophthalmic solution, Place 1 drop into the right eye 3 (three) times daily as needed., Disp: 5 mL, Rfl: 0   HYDROcodone-acetaminophen (NORCO/VICODIN) 5-325 MG tablet, Take 1 tablet by mouth every 6 (six) hours as needed for severe pain., Disp: 4 tablet, Rfl: 0   meclizine (ANTIVERT) 25 MG tablet, Take 1 tablet (25 mg total) by mouth 3 (three) times daily as needed for dizziness., Disp: 30 tablet, Rfl: 0   OXcarbazepine (TRILEPTAL) 150 MG tablet, Take 1 tablet (150 mg total) by mouth 2 (two) times daily., Disp: 60 tablet, Rfl: 0   oxyCODONE (ROXICODONE) 5 MG immediate release tablet, Take 1 tablet (5 mg total) by mouth every 6 (six) hours as needed for severe pain or breakthrough pain., Disp: 30 tablet, Rfl: 0   oxymetazoline (AFRIN NASAL SPRAY) 0.05 % nasal spray, Place 1 spray into both nostrils 2 (two) times daily as needed for congestion (for nosebleeds)., Disp: 30 mL, Rfl: 0   phenol (CHLORASEPTIC) 1.4 % LIQD, Use as directed 1 spray in the mouth or throat as needed for throat irritation / pain., Disp: 177 mL, Rfl: 0   prazosin (MINIPRESS) 2 MG  capsule, Take 1 capsule (2 mg total) by mouth at bedtime., Disp: 30 capsule, Rfl: 0   QUEtiapine (SEROQUEL) 50 MG tablet, Take 1 tablet (50 mg total) by mouth at bedtime., Disp: 30 tablet, Rfl: 0  Observations/Objective: Patient is well-developed, well-nourished in no acute distress.  Resting comfortably  at home.  Head is normocephalic, atraumatic.  No visible facial swelling. No labored breathing. Speech is clear and coherent with logical content.  Patient is alert and oriented at baseline.  Pain with lateral bending of neck, mainly felt overlying trapezius, but also some pain in distribution of sternocleidomastoid muscle on the left side.  Similar pain with rotation of neck, but maintains full range of motion  Assessment and Plan: 1. Muscle spasm - tiZANidine (ZANAFLEX) 2 MG tablet; Take 1 tablet (2 mg total) by mouth every 8 (eight) hours as needed for muscle spasms.  Dispense: 15 tablet; Refill: 0 - predniSONE (STERAPRED UNI-PAK  21 TAB) 10 MG (21) TBPK tablet; Take following package directions  Dispense: 21 tablet; Refill: 0  2. Neck pain  Concern for substantial muscle tension and spasm causing knotting and contributing to tension headache.  Range of motion is preserved but with some pain and tightness.  Supportive measures and OTC medications reviewed with patient.  Will start low-dose tizanidine and a short course of steroid to try to reduce some of the inflammation in the musculature.  Strict in person follow-up precautions reviewed.  She is aware that if anything worsens despite treatment, she needs to seek an in person evaluation ASAP.  Follow Up Instructions: I discussed the assessment and treatment plan with the patient. The patient was provided an opportunity to ask questions and all were answered. The patient agreed with the plan and demonstrated an understanding of the instructions.  A copy of instructions were sent to the patient via MyChart unless otherwise noted below.    The patient was advised to call back or seek an in-person evaluation if the symptoms worsen or if the condition fails to improve as anticipated.  Time:  I spent 12 minutes with the patient via telehealth technology discussing the above problems/concerns.    Leeanne Rio, PA-C

## 2023-03-12 ENCOUNTER — Telehealth: Payer: Medicaid Other | Admitting: Physician Assistant

## 2023-03-12 DIAGNOSIS — L0292 Furuncle, unspecified: Secondary | ICD-10-CM | POA: Diagnosis not present

## 2023-03-12 MED ORDER — DOXYCYCLINE HYCLATE 100 MG PO TABS: 100.0000 mg | ORAL_TABLET | Freq: Two times a day (BID) | ORAL | 0 refills | Status: DC

## 2023-03-12 NOTE — Progress Notes (Signed)
E-Visit for Vaginal Symptoms  We are sorry that you are not feeling well. Here is how we plan to help! Based on what you shared with me it looks like you have a boil of the labia versus a Bartholin Cyst that is infected.  Your treatment plan is I have prescribed Doxycycline 100mg  Take 1 tablet twice daily for 7 days.  You can also do Epsom Salt soaks. Place 1-2 cups of Epsom Salt in warm bath water and sit for 15-30 minutes. You can also get a Sitz bath kit from a local pharmacy if you do not have access to a bathtub or do not feel comfortable getting in and out on your own.  Be sure to take all of the medication as directed. Stop taking any medication if you develop a rash, tongue swelling or shortness of breath. Mothers who are breast feeding should consider pumping and discarding their breast milk while on these antibiotics. However, there is no consensus that infant exposure at these doses would be harmful.  Remember that medication creams can weaken latex condoms.   HOME CARE:  Good hygiene may prevent some types of vaginosis from recurring and may relieve some symptoms:  Avoid baths, hot tubs and whirlpool spas. Rinse soap from your outer genital area after a shower, and dry the area well to prevent irritation. Don't use scented or harsh soaps, such as those with deodorant or antibacterial action. Avoid irritants. These include scented tampons and pads. Wipe from front to back after using the toilet. Doing so avoids spreading fecal bacteria to your vagina.  Other things that may help prevent vaginosis include:  Don't douche. Your vagina doesn't require cleansing other than normal bathing. Repetitive douching disrupts the normal organisms that reside in the vagina and can actually increase your risk of vaginal infection. Douching won't clear up a vaginal infection. Use a latex condom. Both female and female latex condoms may help you avoid infections spread by sexual contact. Wear cotton  underwear. Also wear pantyhose with a cotton crotch. If you feel comfortable without it, skip wearing underwear to bed. Yeast thrives in [employer] Your symptoms should improve in the next day or two.  GET HELP RIGHT AWAY IF:  You have pain in your lower abdomen ( pelvic area or over your ovaries) You develop nausea or vomiting You develop a fever Your discharge changes or worsens You have persistent pain with intercourse You develop shortness of breath, a rapid pulse, or you faint.  These symptoms could be signs of problems or infections that need to be evaluated by a medical provider now.  MAKE SURE YOU   Understand these instructions. Will watch your condition. Will get help right away if you are not doing well or get worse.  Thank you for choosing an e-visit.  Your e-visit answers were reviewed by a board certified advanced clinical practitioner to complete your personal care plan. Depending upon the condition, your plan could have included both over the counter or prescription medications.  Please review your pharmacy choice. Make sure the pharmacy is open so you can pick up prescription now. If there is a problem, you may contact your provider through [employer] and have the prescription routed to another pharmacy.  Your safety is important to [place]. If you have drug allergies check your prescription carefully.   For the next 24 hours you can use MyChart to ask questions about today's visit, request a non-urgent call back, or ask for a work or  school excuse. You will get an email in the next two days asking about your experience. I hope that your e-visit has been valuable and will speed your recovery.  I have spent 5 minutes in review of e-visit questionnaire, review and updating patient chart, medical decision making and response to patient.   [name], PA-C

## 2023-03-22 ENCOUNTER — Telehealth: Payer: Medicaid Other | Admitting: Family Medicine

## 2023-03-22 DIAGNOSIS — N926 Irregular menstruation, unspecified: Secondary | ICD-10-CM

## 2023-03-22 NOTE — Progress Notes (Signed)
Irregular menses needs in person eval

## 2023-03-26 ENCOUNTER — Ambulatory Visit: Payer: Medicaid Other | Admitting: Obstetrics and Gynecology

## 2023-03-26 ENCOUNTER — Telehealth: Payer: Medicaid Other | Admitting: Nurse Practitioner

## 2023-03-26 DIAGNOSIS — N939 Abnormal uterine and vaginal bleeding, unspecified: Secondary | ICD-10-CM

## 2023-03-26 NOTE — Progress Notes (Signed)
Virtual Visit Consent   Alexandra Gray, you are scheduled for a virtual visit with a [facility] provider today. Just as with appointments in the office, your consent must be obtained to participate. Your consent will be active for this visit and any virtual visit you may have with one of our providers in the next 365 days. If you have a MyChart account, a copy of this consent can be sent to you electronically.  As this is a virtual visit, video technology does not allow for your provider to perform a traditional examination. This may limit your provider's ability to fully assess your condition. If your provider identifies any concerns that need to be evaluated in person or the need to arrange testing (such as labs, EKG, etc.), we will make arrangements to do so. Although advances in technology are sophisticated, we cannot ensure that it will always work on either your end or our end. If the connection with a video visit is poor, the visit may have to be switched to a telephone visit. With either a video or telephone visit, we are not always able to ensure that we have a secure connection.  By engaging in this virtual visit, you consent to the provision of healthcare and authorize for your insurance to be billed (if applicable) for the services provided during this visit. Depending on your insurance coverage, you may receive a charge related to this service.  I need to obtain your verbal consent now. Are you willing to proceed with your visit today? Alexandra Gray has provided verbal consent on 03/26/2023 for a virtual visit (video or telephone). Viviano Simas, FNP  Date: 03/26/2023 4:17 PM  Virtual Visit via Video Note   I, Viviano Simas, connected with  Alexandra Gray  ([id number], 16-Feb-1991) on 03/26/23 at  4:30 PM EDT by a video-enabled telemedicine application and verified that I am speaking with the correct person using two identifiers.  Location: Patient: Virtual Visit Location  Patient: Home Provider: Virtual Visit Location Provider: Home Office   I discussed the limitations of evaluation and management by telemedicine and the availability of in person appointments. The patient expressed understanding and agreed to proceed.    History of Present Illness: Alexandra Gray is a 32 y.o. who identifies as a female who was assigned female at birth, and is being seen today for on and off spotting vaginally.   She notices pink discharge when she goes to the bathroom and wipes  This has been happening for the past week   Last menses was 01/30/23 She does not use birth control   She is sexually active most recent sexual encounter was 4 weeks   She has not taken a pregnancy test    Problems:  Patient Active Problem List   Diagnosis Date Noted   Benign essential HTN 10/08/2021   BMI 50.0-59.9, adult (HCC) 05/28/2018   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   PTSD (post-traumatic stress disorder) 02/16/2014   Sleep apnea 02/16/2014   Severe episode of recurrent major depressive disorder, 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    Allergies:  Allergies  Allergen Reactions   Infuvite Adult [Multiple Vitamin] Shortness Of Breath   Nubain [Nalbuphine Hcl] Other (See Comments)    Makes patient hot   Medications:  Current Outpatient Medications:    albuterol (VENTOLIN HFA) 108 (90 Base) MCG/ACT inhaler, Inhale 2 puffs into the lungs every 6 (six) hours as needed for wheezing or shortness of breath. Please instruct in  technique and dispense with spacer, Disp: 18 g, Rfl: 0   albuterol (VENTOLIN HFA) 108 (90 Base) MCG/ACT inhaler, Inhale 2 puffs into the lungs every 6 (six) hours as needed for wheezing or shortness of breath., Disp: 8 g, Rfl: 0   amLODipine (NORVASC) 5 MG tablet, Take 1 tablet (5 mg total) by mouth daily., Disp: 30 tablet, Rfl: 0   benzonatate (TESSALON) 100 MG capsule, Take 1 capsule (100 mg total) by mouth 3 (three) times daily as  needed., Disp: 30 capsule, Rfl: 0   desvenlafaxine (PRISTIQ) 50 MG 24 hr tablet, Take 1 tablet (50 mg total) by mouth daily., Disp: 30 tablet, Rfl: 0   doxycycline (VIBRA-TABS) 100 MG tablet, Take 1 tablet (100 mg total) by mouth 2 (two) times daily., Disp: 14 tablet, Rfl: 0   gabapentin (NEURONTIN) 100 MG capsule, Take 1 capsule (100 mg total) by mouth 3 (three) times daily., Disp: 20 capsule, Rfl: 0   homatropine 5 % ophthalmic solution, Place 1 drop into the right eye 3 (three) times daily as needed., Disp: 5 mL, Rfl: 0   HYDROcodone-acetaminophen (NORCO/VICODIN) 5-325 MG tablet, Take 1 tablet by mouth every 6 (six) hours as needed for severe pain., Disp: 4 tablet, Rfl: 0   meclizine (ANTIVERT) 25 MG tablet, Take 1 tablet (25 mg total) by mouth 3 (three) times daily as needed for dizziness., Disp: 30 tablet, Rfl: 0   OXcarbazepine (TRILEPTAL) 150 MG tablet, Take 1 tablet (150 mg total) by mouth 2 (two) times daily., Disp: 60 tablet, Rfl: 0   oxyCODONE (ROXICODONE) 5 MG immediate release tablet, Take 1 tablet (5 mg total) by mouth every 6 (six) hours as needed for severe pain or breakthrough pain., Disp: 30 tablet, Rfl: 0   oxymetazoline (AFRIN NASAL SPRAY) 0.05 % nasal spray, Place 1 spray into both nostrils 2 (two) times daily as needed for congestion (for nosebleeds)., Disp: 30 mL, Rfl: 0   phenol (CHLORASEPTIC) 1.4 % LIQD, Use as directed 1 spray in the mouth or throat as needed for throat irritation / pain., Disp: 177 mL, Rfl: 0   prazosin (MINIPRESS) 2 MG capsule, Take 1 capsule (2 mg total) by mouth at bedtime., Disp: 30 capsule, Rfl: 0   predniSONE (STERAPRED UNI-PAK 21 TAB) 10 MG (21) TBPK tablet, Take following package directions, Disp: 21 tablet, Rfl: 0   QUEtiapine (SEROQUEL) 50 MG tablet, Take 1 tablet (50 mg total) by mouth at bedtime., Disp: 30 tablet, Rfl: 0   tiZANidine (ZANAFLEX) 2 MG tablet, Take 1 tablet (2 mg total) by mouth every 8 (eight) hours as needed for muscle spasms.,  Disp: 15 tablet, Rfl: 0  Observations/Objective: Patient is well-developed, well-nourished in no acute distress.  Resting comfortably  at home.  Head is normocephalic, atraumatic.  No labored breathing.  Speech is clear and coherent with logical content.  Patient is alert and oriented at baseline.    Assessment and Plan: 1. Vaginal spotting  Advised follow up with OBGYN for testing  May start with home pregnancy test- should also has STI workup   Patient is agreeable to plan and is established with an OBGYN       Follow Up Instructions: I discussed the assessment and treatment plan with the patient. The patient was provided an opportunity to ask questions and all were answered. The patient agreed with the plan and demonstrated an understanding of the instructions.  A copy of instructions were sent to the patient via MyChart unless otherwise noted below.  The patient was advised to call back or seek an in-person evaluation if the symptoms worsen or if the condition fails to improve as anticipated.  Time:  I spent 8 minutes with the patient via telehealth technology discussing the above problems/concerns.    Viviano Simas, FNP

## 2023-05-21 ENCOUNTER — Ambulatory Visit: Payer: 59 | Admitting: Obstetrics and Gynecology

## 2023-06-13 DIAGNOSIS — Z30017 Encounter for initial prescription of implantable subdermal contraceptive: Secondary | ICD-10-CM | POA: Diagnosis not present

## 2023-06-13 DIAGNOSIS — Z3202 Encounter for pregnancy test, result negative: Secondary | ICD-10-CM | POA: Diagnosis not present

## 2023-06-13 DIAGNOSIS — Z114 Encounter for screening for human immunodeficiency virus [HIV]: Secondary | ICD-10-CM | POA: Diagnosis not present

## 2023-06-13 DIAGNOSIS — Z113 Encounter for screening for infections with a predominantly sexual mode of transmission: Secondary | ICD-10-CM | POA: Diagnosis not present

## 2023-06-20 ENCOUNTER — Other Ambulatory Visit: Payer: Self-pay

## 2023-06-20 ENCOUNTER — Emergency Department (HOSPITAL_COMMUNITY): Payer: 59

## 2023-06-20 ENCOUNTER — Encounter (HOSPITAL_COMMUNITY): Payer: Self-pay | Admitting: *Deleted

## 2023-06-20 ENCOUNTER — Inpatient Hospital Stay (HOSPITAL_COMMUNITY)
Admission: EM | Admit: 2023-06-20 | Discharge: 2023-06-24 | DRG: 418 | Disposition: A | Discharge status: Home / Self Care | Payer: 59 | Attending: Internal Medicine | Admitting: Internal Medicine

## 2023-06-20 DIAGNOSIS — Z6841 Body Mass Index (BMI) 40.0 and over, adult: Secondary | ICD-10-CM

## 2023-06-20 DIAGNOSIS — K851 Biliary acute pancreatitis without necrosis or infection: Principal | ICD-10-CM | POA: Diagnosis present

## 2023-06-20 DIAGNOSIS — D696 Thrombocytopenia, unspecified: Secondary | ICD-10-CM | POA: Diagnosis present

## 2023-06-20 DIAGNOSIS — E876 Hypokalemia: Secondary | ICD-10-CM | POA: Diagnosis present

## 2023-06-20 DIAGNOSIS — A599 Trichomoniasis, unspecified: Secondary | ICD-10-CM | POA: Diagnosis not present

## 2023-06-20 DIAGNOSIS — R6510 Systemic inflammatory response syndrome (SIRS) of non-infectious origin without acute organ dysfunction: Secondary | ICD-10-CM | POA: Diagnosis not present

## 2023-06-20 DIAGNOSIS — Z79899 Other long term (current) drug therapy: Secondary | ICD-10-CM

## 2023-06-20 DIAGNOSIS — G8918 Other acute postprocedural pain: Secondary | ICD-10-CM | POA: Diagnosis not present

## 2023-06-20 DIAGNOSIS — K838 Other specified diseases of biliary tract: Secondary | ICD-10-CM | POA: Diagnosis not present

## 2023-06-20 DIAGNOSIS — R7401 Elevation of levels of liver transaminase levels: Secondary | ICD-10-CM | POA: Diagnosis not present

## 2023-06-20 DIAGNOSIS — K828 Other specified diseases of gallbladder: Secondary | ICD-10-CM | POA: Diagnosis not present

## 2023-06-20 DIAGNOSIS — K859 Acute pancreatitis without necrosis or infection, unspecified: Principal | ICD-10-CM

## 2023-06-20 DIAGNOSIS — G4730 Sleep apnea, unspecified: Secondary | ICD-10-CM | POA: Diagnosis present

## 2023-06-20 DIAGNOSIS — K8042 Calculus of bile duct with acute cholecystitis without obstruction: Secondary | ICD-10-CM | POA: Diagnosis not present

## 2023-06-20 DIAGNOSIS — K810 Acute cholecystitis: Secondary | ICD-10-CM | POA: Insufficient documentation

## 2023-06-20 DIAGNOSIS — K8000 Calculus of gallbladder with acute cholecystitis without obstruction: Secondary | ICD-10-CM | POA: Diagnosis not present

## 2023-06-20 DIAGNOSIS — Z98891 History of uterine scar from previous surgery: Secondary | ICD-10-CM

## 2023-06-20 DIAGNOSIS — K8050 Calculus of bile duct without cholangitis or cholecystitis without obstruction: Secondary | ICD-10-CM

## 2023-06-20 DIAGNOSIS — Z8679 Personal history of other diseases of the circulatory system: Secondary | ICD-10-CM

## 2023-06-20 DIAGNOSIS — R112 Nausea with vomiting, unspecified: Secondary | ICD-10-CM | POA: Diagnosis present

## 2023-06-20 DIAGNOSIS — F17290 Nicotine dependence, other tobacco product, uncomplicated: Secondary | ICD-10-CM | POA: Diagnosis present

## 2023-06-20 DIAGNOSIS — K8591 Acute pancreatitis with uninfected necrosis, unspecified: Secondary | ICD-10-CM | POA: Diagnosis not present

## 2023-06-20 DIAGNOSIS — Z888 Allergy status to other drugs, medicaments and biological substances status: Secondary | ICD-10-CM | POA: Diagnosis not present

## 2023-06-20 DIAGNOSIS — R1013 Epigastric pain: Secondary | ICD-10-CM | POA: Diagnosis present

## 2023-06-20 DIAGNOSIS — K8020 Calculus of gallbladder without cholecystitis without obstruction: Secondary | ICD-10-CM | POA: Diagnosis not present

## 2023-06-20 DIAGNOSIS — I10 Essential (primary) hypertension: Secondary | ICD-10-CM | POA: Diagnosis not present

## 2023-06-20 DIAGNOSIS — R1011 Right upper quadrant pain: Secondary | ICD-10-CM | POA: Diagnosis not present

## 2023-06-20 DIAGNOSIS — A749 Chlamydial infection, unspecified: Secondary | ICD-10-CM | POA: Diagnosis present

## 2023-06-20 DIAGNOSIS — K760 Fatty (change of) liver, not elsewhere classified: Secondary | ICD-10-CM | POA: Diagnosis present

## 2023-06-20 DIAGNOSIS — K9186 Retained cholelithiasis following cholecystectomy: Secondary | ICD-10-CM | POA: Diagnosis not present

## 2023-06-20 DIAGNOSIS — J449 Chronic obstructive pulmonary disease, unspecified: Secondary | ICD-10-CM | POA: Diagnosis not present

## 2023-06-20 DIAGNOSIS — K8062 Calculus of gallbladder and bile duct with acute cholecystitis without obstruction: Secondary | ICD-10-CM | POA: Diagnosis not present

## 2023-06-20 LAB — URINALYSIS, ROUTINE W REFLEX MICROSCOPIC
Bilirubin Urine: NEGATIVE
Glucose, UA: NEGATIVE mg/dL
Hgb urine dipstick: NEGATIVE
Ketones, ur: NEGATIVE mg/dL
Nitrite: NEGATIVE
Protein, ur: NEGATIVE mg/dL
Specific Gravity, Urine: 1.011 (ref 1.005–1.030)
pH: 5 (ref 5.0–8.0)

## 2023-06-20 LAB — LIPASE, BLOOD: Lipase: 1763 U/L — ABNORMAL HIGH (ref 11–51)

## 2023-06-20 LAB — COMPREHENSIVE METABOLIC PANEL
ALT: 459 U/L — ABNORMAL HIGH (ref 0–44)
AST: 369 U/L — ABNORMAL HIGH (ref 15–41)
Albumin: 3.7 g/dL (ref 3.5–5.0)
Alkaline Phosphatase: 293 U/L — ABNORMAL HIGH (ref 38–126)
Anion gap: 12 (ref 5–15)
BUN: 5 mg/dL — ABNORMAL LOW (ref 6–20)
CO2: 22 mmol/L (ref 22–32)
Calcium: 8.9 mg/dL (ref 8.9–10.3)
Chloride: 103 mmol/L (ref 98–111)
Creatinine, Ser: 0.8 mg/dL (ref 0.44–1.00)
GFR, Estimated: >60 mL/min (ref >60)
Glucose, Bld: 132 mg/dL — ABNORMAL HIGH (ref 70–99)
Potassium: 3.4 mmol/L — ABNORMAL LOW (ref 3.5–5.1)
Sodium: 137 mmol/L (ref 135–145)
Total Bilirubin: 3.2 mg/dL — ABNORMAL HIGH (ref 0.3–1.2)
Total Protein: 7.8 g/dL (ref 6.5–8.1)

## 2023-06-20 LAB — CBC
HCT: 45.2 % (ref 36.0–46.0)
Hemoglobin: 14.7 g/dL (ref 12.0–15.0)
MCH: 27.7 pg (ref 26.0–34.0)
MCHC: 32.5 g/dL (ref 30.0–36.0)
MCV: 85.3 fL (ref 80.0–100.0)
Platelets: 326 10*3/uL (ref 150–400)
RBC: 5.3 MIL/uL — ABNORMAL HIGH (ref 3.87–5.11)
RDW: 14.9 % (ref 11.5–15.5)
WBC: 12.2 10*3/uL — ABNORMAL HIGH (ref 4.0–10.5)
nRBC: 0 % (ref 0.0–0.2)

## 2023-06-20 LAB — HCG, SERUM, QUALITATIVE: Preg, Serum: NEGATIVE

## 2023-06-20 MED ORDER — SODIUM CHLORIDE 0.9 % IV BOLUS
500.0000 mL | Freq: Once | INTRAVENOUS | Status: AC
  Administered 2023-06-20: 500 mL via INTRAVENOUS

## 2023-06-20 MED ORDER — HYDROMORPHONE HCL 1 MG/ML IJ SOLN
0.5000 mg | Freq: Once | INTRAMUSCULAR | Status: AC
  Administered 2023-06-20: 0.5 mg via INTRAVENOUS
  Filled 2023-06-20: qty 1

## 2023-06-20 MED ORDER — ONDANSETRON HCL 4 MG/2ML IJ SOLN
4.0000 mg | Freq: Once | INTRAMUSCULAR | Status: AC
  Administered 2023-06-20: 4 mg via INTRAVENOUS
  Filled 2023-06-20: qty 2

## 2023-06-20 NOTE — ED Triage Notes (Signed)
The pt is c/o upper abd pain with lt sided radiation for 4 days with nausea vomiting and diarrhea  lmp birthcontrol

## 2023-06-20 NOTE — ED Provider Notes (Signed)
EMERGENCY DEPARTMENT AT [facility] Provider Note   CSN: [id number] Arrival date & time: 06/20/23  1807     History  Chief Complaint  Patient presents with   Abdominal Pain    Alexandra Gray is a 32 y.o. female.   Abdominal Pain Patient upper abdominal pain.  Has had for the last 4 days.  Nausea vomiting and some loose stools.  Has not really been able to eat.  Has had some upper quadrant pain previously but has resolved.  No fevers.  No diarrhea.  Denies pregnancy.  Now feels that the pain goes to the back also.    Past Medical History:  Diagnosis Date   Cesarean delivery delivered 01/04/2019   Chlamydia    Chronic hypertension during pregnancy, antepartum 07/30/2018   BP elevated 12/21, 01/29/18, 05/11/18, 07/29/18  Arly.Keller ] Aspirin 81 mg daily after 12 weeks Current antihypertensives:  None   Baseline and surveillance labs (pulled in from [facility], refresh links as needed)  Lab Results  Component Value Date   PLT 218 07/29/2018   CREATININE 0.59 07/29/2018   AST 16 07/29/2018   ALT 15 07/29/2018              Antenatal Testing CHTN - O10.919  Group I  BP < 140/90, no preecl   Depression    doing ok now   Gonorrhea    H/O: C-section 01/04/2019   H/O: C-section 01/04/2019   History of cesarean delivery affecting pregnancy 10/03/2015   x1 in 2016, currently plans RLTCS  Declines TOLAC. Form signed 09/20/2018    History of cesarean delivery affecting pregnancy 10/03/2015   x1 in 2016, currently plans RLTCS  Declines TOLAC. Form signed 09/20/2018    History of cesarean delivery affecting pregnancy 10/03/2015   x1 in 2016, currently plans RLTCS  Declines TOLAC. Form signed 09/20/2018    History of pregnancy induced hypertension 12/06/2016   Hx of trichomoniasis    Hypertension    sometimes is up, never on meds   Mental disorder    Ovarian cyst    PID (pelvic inflammatory disease)    Sleep apnea     Home Medications Prior to Admission medications   Medication  Sig Start Date End Date Taking? Authorizing Provider  albuterol (VENTOLIN HFA) 108 (90 Base) MCG/ACT inhaler Inhale 2 puffs into the lungs every 6 (six) hours as needed for wheezing or shortness of breath. Please instruct in technique and dispense with spacer 06/17/22   Daphine Deutscher, Mary-Margaret, FNP  albuterol (VENTOLIN HFA) 108 (90 Base) MCG/ACT inhaler Inhale 2 puffs into the lungs every 6 (six) hours as needed for wheezing or shortness of breath. 01/09/23   Viviano Simas, FNP  amLODipine (NORVASC) 5 MG tablet Take 1 tablet (5 mg total) by mouth daily. 12/17/21 04/11/22  Princess Bruins, DO  benzonatate (TESSALON) 100 MG capsule Take 1 capsule (100 mg total) by mouth 3 (three) times daily as needed. 01/09/23   Viviano Simas, FNP  desvenlafaxine (PRISTIQ) 50 MG 24 hr tablet Take 1 tablet (50 mg total) by mouth daily. 12/17/21 04/11/22  Princess Bruins, DO  doxycycline (VIBRA-TABS) 100 MG tablet Take 1 tablet (100 mg total) by mouth 2 (two) times daily. 03/12/23   [name], PA-C  gabapentin (NEURONTIN) 100 MG capsule Take 1 capsule (100 mg total) by mouth 3 (three) times daily. 04/11/22   Fayrene Helper, PA-C  homatropine 5 % ophthalmic solution Place 1 drop into the right eye 3 (three) times  daily as needed. 12/10/22   Gust Brooms, MD  HYDROcodone-acetaminophen (NORCO/VICODIN) 5-325 MG tablet Take 1 tablet by mouth every 6 (six) hours as needed for severe pain. 02/16/22   Renne Crigler, PA-C  meclizine (ANTIVERT) 25 MG tablet Take 1 tablet (25 mg total) by mouth 3 (three) times daily as needed for dizziness. 08/28/22   Pollyann Savoy, MD  OXcarbazepine (TRILEPTAL) 150 MG tablet Take 1 tablet (150 mg total) by mouth 2 (two) times daily. 12/16/21 04/11/22  Princess Bruins, DO  oxyCODONE (ROXICODONE) 5 MG immediate release tablet Take 1 tablet (5 mg total) by mouth every 6 (six) hours as needed for severe pain or breakthrough pain. 12/10/22   Gust Brooms, MD  oxymetazoline (AFRIN NASAL SPRAY) 0.05 % nasal spray  Place 1 spray into both nostrils 2 (two) times daily as needed for congestion (for nosebleeds). 12/10/22   Gust Brooms, MD  phenol (CHLORASEPTIC) 1.4 % LIQD Use as directed 1 spray in the mouth or throat as needed for throat irritation / pain. 07/11/22   Couture, Cortni S, PA-C  prazosin (MINIPRESS) 2 MG capsule Take 1 capsule (2 mg total) by mouth at bedtime. 12/16/21 04/11/22  Princess Bruins, DO  predniSONE (STERAPRED UNI-PAK 21 TAB) 10 MG (21) TBPK tablet Take following package directions 02/13/23   Waldon Merl, PA-C  QUEtiapine (SEROQUEL) 50 MG tablet Take 1 tablet (50 mg total) by mouth at bedtime. 12/16/21 04/11/22  Princess Bruins, DO  tiZANidine (ZANAFLEX) 2 MG tablet Take 1 tablet (2 mg total) by mouth every 8 (eight) hours as needed for muscle spasms. 02/13/23   Waldon Merl, PA-C      Allergies    Infuvite adult [multiple vitamin] and Nubain [nalbuphine hcl]    Review of Systems   Review of Systems  Gastrointestinal:  Positive for abdominal pain.    Physical Exam Updated Vital Signs BP 133/76 (BP Location: Right Wrist)   Pulse 81   Temp 98.6 F (37 C) (Oral)   Resp (!) 21   Ht 5' (1.524 m)   Wt (!) 158.8 kg   SpO2 100%   BMI 68.37 kg/m  Physical Exam Vitals and nursing note reviewed.  Constitutional:      Appearance: She is obese.  Cardiovascular:     Rate and Rhythm: Regular rhythm.  Abdominal:     Tenderness: There is abdominal tenderness.     Hernia: No hernia is present.  Neurological:     Mental Status: She is alert.     ED Results / Procedures / Treatments   Labs (all labs ordered are listed, but only abnormal results are displayed) Labs Reviewed  LIPASE, BLOOD - Abnormal; Notable for the following components:      Result Value   Lipase 1,763 (*)    All other components within normal limits  COMPREHENSIVE METABOLIC PANEL - Abnormal; Notable for the following components:   Potassium 3.4 (*)    Glucose, Bld 132 (*)    BUN 5 (*)    AST 369 (*)     ALT 459 (*)    Alkaline Phosphatase 293 (*)    Total Bilirubin 3.2 (*)    All other components within normal limits  CBC - Abnormal; Notable for the following components:   WBC 12.2 (*)    RBC 5.30 (*)    All other components within normal limits  URINALYSIS, ROUTINE W REFLEX MICROSCOPIC - Abnormal; Notable for the following components:   Color, Urine AMBER (*)  Leukocytes,Ua TRACE (*)    Bacteria, UA RARE (*)    All other components within normal limits  HCG, SERUM, QUALITATIVE    EKG None  Radiology US Abdomen Limited RUQ (LIVER/GB)  Result Date: 06/20/2023 CLINICAL DATA:  Right upper quadrant abdominal pain. EXAM: ULTRASOUND ABDOMEN LIMITED RIGHT UPPER QUADRANT COMPARISON:  Ultrasound dated 06/07/2019. FINDINGS: Gallbladder: Multiple gallstones. No gallbladder wall thickening or pericholecystic fluid. Negative sonographic Murphy's sign. Common bile duct: Diameter: 7 mm Liver: There is diffuse increased liver echogenicity most commonly seen in the setting of fatty infiltration. Superimposed inflammation or fibrosis is not excluded. Clinical correlation is recommended. A 1.9 cm echogenic structure in the porta hepatis likely represents periportal fat or tissue interface. Portal vein is patent on color Doppler imaging with normal direction of blood flow towards the liver. Other: None. IMPRESSION: 1. Cholelithiasis without sonographic evidence of acute cholecystitis. 2. Fatty liver. Electronically Signed   By: Elgie Collard M.D.   On: 06/20/2023 22:21    Procedures Procedures    Medications Ordered in ED Medications  HYDROmorphone (DILAUDID) injection 0.5 mg (has no administration in time range)  ondansetron (ZOFRAN) injection 4 mg (4 mg Intravenous Given 06/20/23 2136)  sodium chloride 0.9 % bolus 500 mL (0 mLs Intravenous Stopped 06/20/23 2240)    ED Course/ Medical Decision Making/ A&P                             Medical Decision Making Amount and/or Complexity of Data  Reviewed Labs: ordered. Radiology: ordered.  Risk Prescription drug management. Decision regarding hospitalization.   Patient with upper abdominal pain.  Differential diagnosis includes most likely biliary colic, cholecystitis potentially pancreatitis or gastritis.  LFTs are elevated as is lipase.  Ultrasound done and does show gallstones without likely cholecystitis.  However common bile duct does appear to be dilated.  Potentially could have distal stone.  Will require admission to internal medicine.  Will discuss with hospitalist.  Likely require MR CP.  Will give pain medicine and fluids at this time.        Final Clinical Impression(s) / ED Diagnoses Final diagnoses:  Acute pancreatitis, unspecified complication status, unspecified pancreatitis type    Rx / DC Orders ED Discharge Orders     None         Benjiman Core, MD 06/20/23 2334

## 2023-06-21 ENCOUNTER — Inpatient Hospital Stay (HOSPITAL_COMMUNITY): Payer: 59

## 2023-06-21 ENCOUNTER — Encounter (HOSPITAL_COMMUNITY): Payer: Self-pay | Admitting: Internal Medicine

## 2023-06-21 DIAGNOSIS — R1013 Epigastric pain: Secondary | ICD-10-CM | POA: Diagnosis present

## 2023-06-21 DIAGNOSIS — K8050 Calculus of bile duct without cholangitis or cholecystitis without obstruction: Secondary | ICD-10-CM

## 2023-06-21 DIAGNOSIS — R6510 Systemic inflammatory response syndrome (SIRS) of non-infectious origin without acute organ dysfunction: Secondary | ICD-10-CM | POA: Diagnosis present

## 2023-06-21 DIAGNOSIS — K851 Biliary acute pancreatitis without necrosis or infection: Secondary | ICD-10-CM | POA: Diagnosis not present

## 2023-06-21 DIAGNOSIS — K859 Acute pancreatitis without necrosis or infection, unspecified: Secondary | ICD-10-CM

## 2023-06-21 DIAGNOSIS — E876 Hypokalemia: Secondary | ICD-10-CM | POA: Diagnosis present

## 2023-06-21 DIAGNOSIS — R7401 Elevation of levels of liver transaminase levels: Secondary | ICD-10-CM | POA: Diagnosis present

## 2023-06-21 DIAGNOSIS — R112 Nausea with vomiting, unspecified: Secondary | ICD-10-CM | POA: Diagnosis present

## 2023-06-21 LAB — PROTIME-INR
INR: 1 (ref 0.8–1.2)
Prothrombin Time: 13.6 seconds (ref 11.4–15.2)

## 2023-06-21 LAB — TRIGLYCERIDES: Triglycerides: 54 mg/dL (ref <150)

## 2023-06-21 LAB — CBC WITH DIFFERENTIAL/PLATELET
Abs Immature Granulocytes: 0.04 10*3/uL (ref 0.00–0.07)
Basophils Absolute: 0 10*3/uL (ref 0.0–0.1)
Basophils Relative: 0 %
Eosinophils Absolute: 0.1 10*3/uL (ref 0.0–0.5)
Eosinophils Relative: 1 %
HCT: 40.4 % (ref 36.0–46.0)
Hemoglobin: 12.9 g/dL (ref 12.0–15.0)
Immature Granulocytes: 1 %
Lymphocytes Relative: 25 %
Lymphs Abs: 2.1 10*3/uL (ref 0.7–4.0)
MCH: 27 pg (ref 26.0–34.0)
MCHC: 31.9 g/dL (ref 30.0–36.0)
MCV: 84.7 fL (ref 80.0–100.0)
Monocytes Absolute: 0.5 10*3/uL (ref 0.1–1.0)
Monocytes Relative: 6 %
Neutro Abs: 5.7 10*3/uL (ref 1.7–7.7)
Neutrophils Relative %: 67 %
Platelets: 251 10*3/uL (ref 150–400)
RBC: 4.77 MIL/uL (ref 3.87–5.11)
RDW: 14.8 % (ref 11.5–15.5)
WBC: 8.5 10*3/uL (ref 4.0–10.5)
nRBC: 0 % (ref 0.0–0.2)

## 2023-06-21 LAB — RAPID URINE DRUG SCREEN, HOSP PERFORMED
Amphetamines: NOT DETECTED
Barbiturates: NOT DETECTED
Benzodiazepines: NOT DETECTED
Cocaine: NOT DETECTED
Opiates: POSITIVE — AB
Tetrahydrocannabinol: POSITIVE — AB

## 2023-06-21 LAB — COMPREHENSIVE METABOLIC PANEL
ALT: 338 U/L — ABNORMAL HIGH (ref 0–44)
AST: 210 U/L — ABNORMAL HIGH (ref 15–41)
Albumin: 3 g/dL — ABNORMAL LOW (ref 3.5–5.0)
Alkaline Phosphatase: 243 U/L — ABNORMAL HIGH (ref 38–126)
Anion gap: 16 — ABNORMAL HIGH (ref 5–15)
BUN: 6 mg/dL (ref 6–20)
CO2: 21 mmol/L — ABNORMAL LOW (ref 22–32)
Calcium: 8.6 mg/dL — ABNORMAL LOW (ref 8.9–10.3)
Chloride: 99 mmol/L (ref 98–111)
Creatinine, Ser: 0.68 mg/dL (ref 0.44–1.00)
GFR, Estimated: >60 mL/min (ref >60)
Glucose, Bld: 102 mg/dL — ABNORMAL HIGH (ref 70–99)
Potassium: 3.3 mmol/L — ABNORMAL LOW (ref 3.5–5.1)
Sodium: 136 mmol/L (ref 135–145)
Total Bilirubin: 4.1 mg/dL — ABNORMAL HIGH (ref 0.3–1.2)
Total Protein: 6.4 g/dL — ABNORMAL LOW (ref 6.5–8.1)

## 2023-06-21 LAB — BILIRUBIN, DIRECT: Bilirubin, Direct: 2.4 mg/dL — ABNORMAL HIGH (ref 0.0–0.2)

## 2023-06-21 LAB — MAGNESIUM: Magnesium: 2 mg/dL (ref 1.7–2.4)

## 2023-06-21 LAB — ETHANOL: Alcohol, Ethyl (B): <10 mg/dL (ref <10)

## 2023-06-21 LAB — LIPASE, BLOOD: Lipase: 391 U/L — ABNORMAL HIGH (ref 11–51)

## 2023-06-21 LAB — GAMMA GT: GGT: 587 U/L — ABNORMAL HIGH (ref 7–50)

## 2023-06-21 MED ORDER — LACTATED RINGERS IV SOLN: INTRAVENOUS | Status: AC

## 2023-06-21 MED ORDER — POTASSIUM CHLORIDE 10 MEQ/100ML IV SOLN
10.0000 meq | INTRAVENOUS | Status: AC
  Administered 2023-06-21 (×2): 10 meq via INTRAVENOUS
  Filled 2023-06-21 (×2): qty 100

## 2023-06-21 MED ORDER — GADOBUTROL 1 MMOL/ML IV SOLN
10.0000 mL | Freq: Once | INTRAVENOUS | Status: AC | PRN
  Administered 2023-06-21: 9 mL via INTRAVENOUS

## 2023-06-21 MED ORDER — HYDROMORPHONE HCL 1 MG/ML IJ SOLN
0.5000 mg | INTRAMUSCULAR | Status: DC | PRN
  Administered 2023-06-21 – 2023-06-24 (×3): 0.5 mg via INTRAVENOUS
  Filled 2023-06-21 (×5): qty 0.5

## 2023-06-21 MED ORDER — ONDANSETRON HCL 4 MG/2ML IJ SOLN: 4.0000 mg | Freq: Four times a day (QID) | INTRAMUSCULAR | Status: DC | PRN

## 2023-06-21 MED ORDER — POTASSIUM CHLORIDE 10 MEQ/100ML IV SOLN
10.0000 meq | INTRAVENOUS | Status: AC
  Administered 2023-06-21 (×3): 10 meq via INTRAVENOUS
  Filled 2023-06-21 (×3): qty 100

## 2023-06-21 MED ORDER — NALOXONE HCL 0.4 MG/ML IJ SOLN: 0.4000 mg | INTRAMUSCULAR | Status: DC | PRN

## 2023-06-21 MED ORDER — ACETAMINOPHEN 650 MG RE SUPP: 650.0000 mg | Freq: Four times a day (QID) | RECTAL | Status: DC | PRN

## 2023-06-21 MED ORDER — ACETAMINOPHEN 325 MG PO TABS
650.0000 mg | ORAL_TABLET | Freq: Four times a day (QID) | ORAL | Status: DC | PRN
  Administered 2023-06-22 – 2023-06-24 (×2): 650 mg via ORAL
  Filled 2023-06-21 (×2): qty 2

## 2023-06-21 NOTE — H&P (Signed)
History and Physical      Alexandra Gray ZOX:[id number] DOB: [birth year] DOA: 06/20/2023; DOS: 06/21/2023  PCP: Fleet Contras, MD  Patient coming from: home   I have personally briefly reviewed patient's old medical records in [facility]  Chief Complaint: Abdominal pain  HPI: Alexandra Gray is a 32 y.o. female with medical history significant for essential hypertension who is admitted to [facility] on 06/20/2023 with potential acute biliary pancreatitis after presenting from home to [facility] ED complaining of abdominal pain.   The patient reports 2 to 3 days of progressive sharp nonradiating epigastric discomfort, worse with palpation over this area.  This is associated with intermittent nausea resulting in at least 2-3 episodes of nonbloody, nonbilious emesis over that timeframe, with most recent such episode occurring just prior to presenting to [facility] emergency department today.  In this context, she conveys decline in oral intake over the last 2 to 3 days,.  Not associate with any subjective fever, chills, rigors, or generalized myalgias.  No recent jaundice, dysuria, or gross hematuria.  She notes 1-2 episodes of loose stool over that timeframe, in the absence of any melena or hematochezia.  No recent chest pain, shortness of breath, palpitations, diaphoresis, dizziness, presyncope, or syncope.  Denies any routine or recent alcohol consumption.  Per chart review, most recent prior liver enzymes were checked on 12/10/2022, and were notable for the following: Alkaline phosphatase 86, total bilirubin 0.7, AST 19, ALT 22.    ED Course:  Vital signs in the ED were notable for the following: Afebrile; heart rate 70-95; systolic blood pressures in the range of 130s to 150s; respiratory rate 15-24, oxygen saturation 97 to 100% on room air.  Labs were notable for the following: CMP notable for the following: Potassium 3.4, bicarbonate 22, creatinine 0.80 compared to 0.77 on 12/11/2022,  glucose 132, calcium 8.9, albumin 3.7, alkaline phosphatase 293, total bilirubin 3.2, AST 369, ALT 459.  Lipase 1763.  CBC notable for white blood cell count 12,200.  Qualitative serum hCG negative.  Urinalysis showed no white blood cells and was nitrate negative.  Imaging in the ED, per corresponding formal radiology read, was notable for the following: Right upper quadrant ultrasound showed cholelithiasis without evidence of acute cholecystitis, including no evidence of gallbladder wall thickening, and or any evidence of pericholecystic fluid, will demonstrating no overt evidence of choledocholithiasis, and with the scan showing common bile duct measuring 7 mm.  While in the ED, the following were administered: Zofran 4 mg IV x 1, Dilaudid 0.5 mg IV x 1, normal saline x 500 cc bolus.  Subsequently, the patient was admitted for further evaluation and management of suspected presenting acute pancreatitis, with concern for biliary component.     Review of Systems: As per HPI otherwise 10 point review of systems negative.   Past Medical History:  Diagnosis Date   Cesarean delivery delivered 01/04/2019   Chlamydia    Chronic hypertension during pregnancy, antepartum 07/30/2018   BP elevated 12/21, 01/29/18, 05/11/18, 07/29/18  Arly.Keller ] Aspirin 81 mg daily after 12 weeks Current antihypertensives:  None   Baseline and surveillance labs (pulled in from [facility], refresh links as needed)  Lab Results  Component Value Date   PLT 218 07/29/2018   CREATININE 0.59 07/29/2018   AST 16 07/29/2018   ALT 15 07/29/2018              Antenatal Testing CHTN - O10.919  Group I  BP < 140/90, no  preecl   Depression    doing ok now   Gonorrhea    H/O: C-section 01/04/2019   H/O: C-section 01/04/2019   History of cesarean delivery affecting pregnancy 10/03/2015   x1 in 2016, currently plans RLTCS  Declines TOLAC. Form signed 09/20/2018    History of cesarean delivery affecting pregnancy 10/03/2015   x1 in 2016, currently  plans RLTCS  Declines TOLAC. Form signed 09/20/2018    History of cesarean delivery affecting pregnancy 10/03/2015   x1 in 2016, currently plans RLTCS  Declines TOLAC. Form signed 09/20/2018    History of pregnancy induced hypertension 12/06/2016   Hx of trichomoniasis    Hypertension    sometimes is up, never on meds   Mental disorder    Ovarian cyst    PID (pelvic inflammatory disease)    Sleep apnea     Past Surgical History:  Procedure Laterality Date   CESAREAN SECTION N/A 10/03/2015   Procedure: CESAREAN SECTION;  Surgeon: Tilda Burrow, MD;  Location: [facility];  Service: Obstetrics;  Laterality: N/A;   CESAREAN SECTION N/A 01/04/2019   Procedure: CESAREAN SECTION;  Surgeon: Hermina Staggers, MD;  Location: [facility] BIRTHING SUITES;  Service: Obstetrics;  Laterality: N/A;   CESAREAN SECTION N/A    Phreesia 03/08/2021   KNEE SURGERY      Social History:  reports that she has been smoking cigars. She has never used smokeless tobacco. She reports current alcohol use. She reports current drug use. Drug: Marijuana.   Allergies  Allergen Reactions   Infuvite Adult [Multiple Vitamin] Shortness Of Breath   Nubain [Nalbuphine Hcl] Other (See Comments)    Makes patient hot    Family History  Problem Relation Age of Onset   Healthy Mother    Healthy Father    Healthy Brother    Cancer Neg Hx    Diabetes Neg Hx    Heart disease Neg Hx    Hypertension Neg Hx    Stroke Neg Hx    Hearing loss Neg Hx     Family history reviewed and not pertinent    Prior to Admission medications   Medication Sig Start Date End Date Taking? Authorizing Provider  albuterol (VENTOLIN HFA) 108 (90 Base) MCG/ACT inhaler Inhale 2 puffs into the lungs every 6 (six) hours as needed for wheezing or shortness of breath. 01/09/23  Yes Viviano Simas, FNP  doxycycline (ADOXA) 100 MG tablet Take 100 mg by mouth 2 (two) times daily.   Yes [provider]  metroNIDAZOLE (FLAGYL) 500 MG tablet Take 500 mg  by mouth 2 (two) times daily.   Yes [provider]     Objective    Physical Exam: Vitals:   06/20/23 2111 06/20/23 2114 06/20/23 2115 06/21/23 0000  BP:  133/76 133/76   Pulse: 88 81 79 70  Resp:  (!) 21 (!) 25 11  Temp:  98.6 F (37 C)    TempSrc:  Oral    SpO2: 98% 100% 100% 94%  Weight:      Height:        General: appears to be stated age; alert, oriented Skin: warm, dry, no rash Head:  AT/[place] Mouth:  Oral mucosa membranes appear dry, normal dentition Neck: supple; trachea midline Heart:  RRR; did not appreciate any M/R/G Lungs: CTAB, did not appreciate any wheezes, rales, or rhonchi Abdomen: + BS; soft, ND, mild tenderness over the epigastrium, in the absence of any associated guarding, rigidity, or rebound tenderness Vascular: 2+  pedal pulses b/l; 2+ radial pulses b/l Extremities: no peripheral edema, no muscle wasting Neuro: strength and sensation intact in upper and lower extremities b/l      Labs on Admission: I have personally reviewed following labs and imaging studies  CBC: Recent Labs  Lab 06/20/23 1819  WBC 12.2*  HGB 14.7  HCT 45.2  MCV 85.3  PLT 326   Basic Metabolic Panel: Recent Labs  Lab 06/20/23 1819  NA 137  K 3.4*  CL 103  CO2 22  GLUCOSE 132*  BUN 5*  CREATININE 0.80  CALCIUM 8.9   GFR: Estimated Creatinine Clearance: 144.7 mL/min (by C-G formula based on SCr of 0.8 mg/dL). Liver Function Tests: Recent Labs  Lab 06/20/23 1819  AST 369*  ALT 459*  ALKPHOS 293*  BILITOT 3.2*  PROT 7.8  ALBUMIN 3.7   Recent Labs  Lab 06/20/23 1819  LIPASE 1,763*   No results for input(s): "AMMONIA" in the last 168 hours. Coagulation Profile: No results for input(s): "INR", "PROTIME" in the last 168 hours. Cardiac Enzymes: No results for input(s): "CKTOTAL", "CKMB", "CKMBINDEX", "TROPONINI" in the last 168 hours. BNP (last 3 results) No results for input(s): "PROBNP" in the last 8760 hours. HbA1C: No results for  input(s): "HGBA1C" in the last 72 hours. CBG: No results for input(s): "GLUCAP" in the last 168 hours. Lipid Profile: No results for input(s): "CHOL", "HDL", "LDLCALC", "TRIG", "CHOLHDL", "LDLDIRECT" in the last 72 hours. Thyroid Function Tests: No results for input(s): "TSH", "T4TOTAL", "FREET4", "T3FREE", "THYROIDAB" in the last 72 hours. Anemia Panel: No results for input(s): "VITAMINB12", "FOLATE", "FERRITIN", "TIBC", "IRON", "RETICCTPCT" in the last 72 hours. Urine analysis:    Component Value Date/Time   COLORURINE AMBER (A) 06/20/2023 1825   APPEARANCEUR CLEAR 06/20/2023 1825   LABSPEC 1.011 06/20/2023 1825   PHURINE 5.0 06/20/2023 1825   GLUCOSEU NEGATIVE 06/20/2023 1825   HGBUR NEGATIVE 06/20/2023 1825   BILIRUBINUR NEGATIVE 06/20/2023 1825   KETONESUR NEGATIVE 06/20/2023 1825   PROTEINUR NEGATIVE 06/20/2023 1825   UROBILINOGEN 1.0 12/26/2018 0833   NITRITE NEGATIVE 06/20/2023 1825   LEUKOCYTESUR TRACE (A) 06/20/2023 1825    Radiological Exams on Admission: US Abdomen Limited RUQ (LIVER/GB)  Result Date: 06/20/2023 CLINICAL DATA:  Right upper quadrant abdominal pain. EXAM: ULTRASOUND ABDOMEN LIMITED RIGHT UPPER QUADRANT COMPARISON:  Ultrasound dated 06/07/2019. FINDINGS: Gallbladder: Multiple gallstones. No gallbladder wall thickening or pericholecystic fluid. Negative sonographic Murphy's sign. Common bile duct: Diameter: 7 mm Liver: There is diffuse increased liver echogenicity most commonly seen in the setting of fatty infiltration. Superimposed inflammation or fibrosis is not excluded. Clinical correlation is recommended. A 1.9 cm echogenic structure in the porta hepatis likely represents periportal fat or tissue interface. Portal vein is patent on color Doppler imaging with normal direction of blood flow towards the liver. Other: None. IMPRESSION: 1. Cholelithiasis without sonographic evidence of acute cholecystitis. 2. Fatty liver. Electronically Signed   By: Elgie Collard M.D.   On: 06/20/2023 22:21      Assessment/Plan   Principal Problem:   Acute pancreatitis Active Problems:   History of essential hypertension   Nausea & vomiting   Epigastric pain   SIRS (systemic inflammatory response syndrome) (HCC)   Hypokalemia   Transaminitis      #) Acute pancreatitis: Suspected on the basis of presenting to 3 days of progressive epigastric discomfort, associated with nausea/vomiting, elevated lipase to 1763.  Was concerned for acute biliary pancreatitis given presenting acute transaminitis with cholestatic pattern, as quantified  above.  However, right upper quadrant ultrasound performed this evening shows no evidence of choledocholithiasis or dilation of the common bile duct, will also showing no evidence of acute cholecystitis.  There was however evidence of cholelithiasis, raising the possibility of a recently passed stone.  Will further evaluate with MRCP.  I have also contacted on-call [facility], Dr. Marina Goodell, requesting consultation in the AM (7/25).  Of note, no clinical evidence to suggest ascending cholangitis, including no evidence of fever, jaundice.  She appears hemodynamically stable, and at baseline mental status.  Denies any history of routine or recent alcohol consumption.  Nonelevated serum calcium level is noted per presenting labs.  Will also check triglyceride level.  On the line: NPO.  Lactated Ringer's at 150 cc/h.  Prn IV Dilaudid.  Prn IV Zofran.  Repeat CMP, CBC in the morning.  Check direct bilirubin, GGT.  MRCP ordered.  GI consult in the morning, as above.  Repeat lipase in the morning.  Check serum ethanol level, urinary drug screen.                 #) SIRS criteria present: Presentation associated with mild leukocytosis, mild tachycardia and tachypnea.  However, in the absence of e/o underlying infection at this time, including, urinalysis that is inconsistent with UTI, criteria for sepsis not currently met.  suspect  non-infectious factors contributing to these SIRS findings, including reactive component in the setting of suspected acute pancreatitis, without corresponding evidence of acute cholecystitis, as further detailed above.  Additionally, no evidence of acute respiratory symptoms.  Patient appears hemodynamically stable at this time.  Consequently, will refrain from initiation of IV antibiotics at this time.   Plan: Repeat CBC with diff in the AM.  Monitor strict I's and O's and daily weights.  Monitor on telemetry. Refraining from IV abx for now, as above.  Further evaluation management of suspected presenting acute pancreatitis, including MRCP, as further detailed above.  Continuous IV fluids, as above.                    #) Hypokalemia: presenting potassium level noted to be 3.4.  This is in the setting of 2 3 days of increased GI losses in the form of nausea/vomiting, concomitant with decline in oral intake over that timeframe.  Plan: monitor on tele. KCl 20 meq IV over 2 hours. Add-on serum mag level. CMP, mag level in the AM.  Lactated Ringer's, as above.                #) History of essential hypertension: Documented history of such, which appears to be managed via lifestyle modifications, in the absence of any antihypertensive medications as an outpatient.  Presenting systolic blood pressures in the 130s to 150s.  Plan: Close monitoring of ensuing blood pressure via routine vital signs.         DVT prophylaxis: SCD's   Code Status: Full code Family Communication: none Disposition Plan: Per Rounding Team Consults called: I have contacted on-call [facility], Dr. Marina Goodell, requesting consultation in the AM (7/25).  ;  Admission status: inpatient     I SPENT GREATER THAN 75  MINUTES IN CLINICAL CARE TIME/MEDICAL DECISION-MAKING IN COMPLETING THIS ADMISSION.      Chaney Born Basma Buchner DO [facility]  From 7PM - 7AM   06/21/2023, 12:13 AM

## 2023-06-21 NOTE — Progress Notes (Signed)
Rcvd pt to unit A&Ox4, VSS. Reporting some pain in abdomen radiating to back relieved with prn pain medications and repositioning. Pt denies SOB but reporting occasional dizziness if sitting up too quickly. Two nurse skin assessment completed. Pt on continuous IV fluids. Remains NPO.   06/21/23 0107  Vitals  Temp 97.9 F (36.6 C)  Temp Source Oral  BP 115/68  MAP (mmHg) 82  BP Location Right Arm  BP Method Automatic  Patient Position (if appropriate) Lying  Pulse Rate 69  Pulse Rate Source Monitor  Resp 15  Level of Consciousness  Level of Consciousness Alert  MEWS COLOR  MEWS Score Color Green  Oxygen Therapy  SpO2 100 %  O2 Device Room Air

## 2023-06-21 NOTE — ED Notes (Signed)
ED TO INPATIENT HANDOFF REPORT  ED Nurse Name and Phone #: Scheryl Marten RN  S Name/Age/Gender Alexandra Gray 32 y.o. female Room/Bed: 026C/026C  Code Status   Code Status: Prior  Home/SNF/Other Home Patient oriented to: self, place, time, and situation Is this baseline? Yes   Triage Complete: Triage complete  Chief Complaint Acute pancreatitis [K85.90]  Triage Note The pt is c/o upper abd pain with lt sided radiation for 4 days with nausea vomiting and diarrhea  lmp birthcontrol   Allergies Allergies  Allergen Reactions   Infuvite Adult [Multiple Vitamin] Shortness Of Breath   Nubain [Nalbuphine Hcl] Other (See Comments)    Makes patient hot    Level of Care/Admitting Diagnosis ED Disposition     ED Disposition  Admit   Condition  --   Comment  Hospital Area: [facility] [[id number]]  Level of Care: Telemetry Medical [104]  May admit patient to [facility] or [facility] if equivalent level of care is available:: No  Covid Evaluation: Asymptomatic - no recent exposure (last 10 days) testing not required  Diagnosis: Acute pancreatitis [577.0.ICD-9-CM]  Admitting Physician: Angie Fava [[id number]]  Attending Physician: Angie Fava [[id number]]  Certification:: I certify this patient will need inpatient services for at least 2 midnights  Estimated Length of Stay: 2          B Medical/Surgery History Past Medical History:  Diagnosis Date   Cesarean delivery delivered 01/04/2019   Chlamydia    Chronic hypertension during pregnancy, antepartum 07/30/2018   BP elevated 12/21, 01/29/18, 05/11/18, 07/29/18  Arly.Keller ] Aspirin 81 mg daily after 12 weeks Current antihypertensives:  None   Baseline and surveillance labs (pulled in from [facility], refresh links as needed)  Lab Results  Component Value Date   PLT 218 07/29/2018   CREATININE 0.59 07/29/2018   AST 16 07/29/2018   ALT 15 07/29/2018              Antenatal Testing CHTN - O10.919  Group I  BP <  140/90, no preecl   Depression    doing ok now   Gonorrhea    H/O: C-section 01/04/2019   H/O: C-section 01/04/2019   History of cesarean delivery affecting pregnancy 10/03/2015   x1 in 2016, currently plans RLTCS  Declines TOLAC. Form signed 09/20/2018    History of cesarean delivery affecting pregnancy 10/03/2015   x1 in 2016, currently plans RLTCS  Declines TOLAC. Form signed 09/20/2018    History of cesarean delivery affecting pregnancy 10/03/2015   x1 in 2016, currently plans RLTCS  Declines TOLAC. Form signed 09/20/2018    History of pregnancy induced hypertension 12/06/2016   Hx of trichomoniasis    Hypertension    sometimes is up, never on meds   Mental disorder    Ovarian cyst    PID (pelvic inflammatory disease)    Sleep apnea    Past Surgical History:  Procedure Laterality Date   CESAREAN SECTION N/A 10/03/2015   Procedure: CESAREAN SECTION;  Surgeon: Tilda Burrow, MD;  Location: [facility];  Service: Obstetrics;  Laterality: N/A;   CESAREAN SECTION N/A 01/04/2019   Procedure: CESAREAN SECTION;  Surgeon: Hermina Staggers, MD;  Location: [facility] BIRTHING SUITES;  Service: Obstetrics;  Laterality: N/A;   CESAREAN SECTION N/A    Phreesia 03/08/2021   KNEE SURGERY       A IV Location/Drains/Wounds Patient Lines/Drains/Airways Status     Active Line/Drains/Airways     Name Placement  date Placement time Site Days   Peripheral IV 06/20/23 20 G 1" Left Antecubital 06/20/23  2136  Antecubital  1            Intake/Output Last 24 hours No intake or output data in the 24 hours ending 06/21/23 0006  Labs/Imaging Results for orders placed or performed during the hospital encounter of 06/20/23 (from the past 48 hour(s))  Lipase, blood     Status: Abnormal   Collection Time: 06/20/23  6:19 PM  Result Value Ref Range   Lipase 1,763 (H) 11 - 51 U/L    Comment: RESULTS CONFIRMED BY MANUAL DILUTION Performed at [facility], [address] [place], [place] [address]    Comprehensive metabolic panel     Status: Abnormal   Collection Time: 06/20/23  6:19 PM  Result Value Ref Range   Sodium 137 135 - 145 mmol/L   Potassium 3.4 (L) 3.5 - 5.1 mmol/L   Chloride 103 98 - 111 mmol/L   CO2 22 22 - 32 mmol/L   Glucose, Bld 132 (H) 70 - 99 mg/dL    Comment: Glucose reference range applies only to samples taken after fasting for at least 8 hours.   BUN 5 (L) 6 - 20 mg/dL   Creatinine, Ser 2.13 0.44 - 1.00 mg/dL   Calcium 8.9 8.9 - 08.6 mg/dL   Total Protein 7.8 6.5 - 8.1 g/dL   Albumin 3.7 3.5 - 5.0 g/dL   AST 578 (H) 15 - 41 U/L   ALT 459 (H) 0 - 44 U/L   Alkaline Phosphatase 293 (H) 38 - 126 U/L   Total Bilirubin 3.2 (H) 0.3 - 1.2 mg/dL   GFR, Estimated >46 >96 mL/min    Comment: (NOTE) Calculated using the CKD-EPI Creatinine Equation (2021)    Anion gap 12 5 - 15    Comment: Performed at [facility], [address] [place], [place] [address]  CBC     Status: Abnormal   Collection Time: 06/20/23  6:19 PM  Result Value Ref Range   WBC 12.2 (H) 4.0 - 10.5 K/uL   RBC 5.30 (H) 3.87 - 5.11 MIL/uL   Hemoglobin 14.7 12.0 - 15.0 g/dL   HCT 41.3 24.4 - 01.0 %   MCV 85.3 80.0 - 100.0 fL   MCH 27.7 26.0 - 34.0 pg   MCHC 32.5 30.0 - 36.0 g/dL   RDW 27.2 53.6 - 64.4 %   Platelets 326 150 - 400 K/uL   nRBC 0.0 0.0 - 0.2 %    Comment: Performed at [facility], [address], [place], [place] [address]  Urinalysis, Routine w reflex microscopic -Urine, Clean Catch     Status: Abnormal   Collection Time: 06/20/23  6:25 PM  Result Value Ref Range   Color, Urine AMBER (A) YELLOW    Comment: BIOCHEMICALS MAY BE AFFECTED BY COLOR   APPearance CLEAR CLEAR   Specific Gravity, Urine 1.011 1.005 - 1.030   pH 5.0 5.0 - 8.0   Glucose, UA NEGATIVE NEGATIVE mg/dL   Hgb urine dipstick NEGATIVE NEGATIVE   Bilirubin Urine NEGATIVE NEGATIVE   Ketones, ur NEGATIVE NEGATIVE mg/dL   Protein, ur NEGATIVE NEGATIVE mg/dL   Nitrite NEGATIVE NEGATIVE    Leukocytes,Ua TRACE (A) NEGATIVE   RBC / HPF 0-5 0 - 5 RBC/hpf   WBC, UA 0-5 0 - 5 WBC/hpf   Bacteria, UA RARE (A) NONE SEEN   Squamous Epithelial / HPF 0-5  0 - 5 /HPF   Mucus PRESENT     Comment: Performed at [facility], [address], [place], [place] [address]  hCG, serum, qualitative     Status: None   Collection Time: 06/20/23  9:54 PM  Result Value Ref Range   Preg, Serum NEGATIVE NEGATIVE    Comment:        THE SENSITIVITY OF THIS METHODOLOGY IS >10 mIU/mL. Performed at [facility], [address] [place], [place] [address]    US Abdomen Limited RUQ (LIVER/GB)  Result Date: 06/20/2023 CLINICAL DATA:  Right upper quadrant abdominal pain. EXAM: ULTRASOUND ABDOMEN LIMITED RIGHT UPPER QUADRANT COMPARISON:  Ultrasound dated 06/07/2019. FINDINGS: Gallbladder: Multiple gallstones. No gallbladder wall thickening or pericholecystic fluid. Negative sonographic Murphy's sign. Common bile duct: Diameter: 7 mm Liver: There is diffuse increased liver echogenicity most commonly seen in the setting of fatty infiltration. Superimposed inflammation or fibrosis is not excluded. Clinical correlation is recommended. A 1.9 cm echogenic structure in the porta hepatis likely represents periportal fat or tissue interface. Portal vein is patent on color Doppler imaging with normal direction of blood flow towards the liver. Other: None. IMPRESSION: 1. Cholelithiasis without sonographic evidence of acute cholecystitis. 2. Fatty liver. Electronically Signed   By: Elgie Collard M.D.   On: 06/20/2023 22:21    Pending Labs Unresulted Labs (From admission, onward)    None       Vitals/Pain Today's Vitals   06/20/23 2110 06/20/23 2111 06/20/23 2114 06/20/23 2152  BP:   133/76   Pulse: 95 88 81   Resp:   (!) 21   Temp:   98.6 F (37 C)   TempSrc:   Oral   SpO2: 97% 98% 100%   Weight:      Height:      PainSc:    5     Isolation Precautions No active  isolations  Medications Medications  ondansetron (ZOFRAN) injection 4 mg (4 mg Intravenous Given 06/20/23 2136)  sodium chloride 0.9 % bolus 500 mL (0 mLs Intravenous Stopped 06/20/23 2240)  HYDROmorphone (DILAUDID) injection 0.5 mg (0.5 mg Intravenous Given 06/20/23 2255)    Mobility walks with person assist     Focused Assessments GI/GU   R Recommendations: See Admitting Provider Note  Report given to:   Additional Notes: n/a

## 2023-06-21 NOTE — Plan of Care (Signed)
  Problem: Education: Goal: Knowledge of General Education information will improve Description: Including pain rating scale, medication(s)/side effects and non-pharmacologic comfort measures Outcome: Progressing   Problem: Clinical Measurements: Goal: Ability to maintain clinical measurements within normal limits will improve Outcome: Progressing Goal: Diagnostic test results will improve Outcome: Progressing Goal: Cardiovascular complication will be avoided Outcome: Progressing   Problem: Nutrition: Goal: Adequate nutrition will be maintained Outcome: Progressing   Problem: Coping: Goal: Level of anxiety will decrease Outcome: Progressing   Problem: Elimination: Goal: Will not experience complications related to bowel motility Outcome: Progressing   Problem: Pain Managment: Goal: General experience of comfort will improve Outcome: Progressing   Problem: Safety: Goal: Ability to remain free from injury will improve Outcome: Progressing   Problem: Skin Integrity: Goal: Risk for impaired skin integrity will decrease Outcome: Progressing

## 2023-06-21 NOTE — Consult Note (Signed)
Consultation  Referring Provider:  [facility]  Primary Care Physician:  Fleet Contras, MD Primary Gastroenterologist:  Gentry Fitz       Reason for Consultation: Gallstone pancreatitis     LOS: 1 day          HPI:   Alexandra Gray is a 32 y.o. female with past medical history significant for hypertension presents for evaluation of gallstone pancreatitis.  Patient states for the last 2 to 3 days she has had progressive, constant, sharp epigastric discomfort that radiates through her back.  She has also had associated nausea and vomiting nonbloody bilious emesis.  She states she has had this epigastric pain intermittent over the last few months, had not improved after stopping fatty foods.  Just became more persistent over the last 2 to 3 days.  She does note she has had dark urine for the last 2 weeks which she initially attributed to being dehydrated but since it was persistent she was concerned.  Minimal tobacco use (black and milds), social alcohol use, occasional NSAID use.  No previous episodes of pancreatitis.  Pertinent lab values No leukocytosis AST 210/ALT 338/alk phos 243, improving T. bili 4.1 (3.2) Direct bilirubin 2.4 GGT 587 Triglycerides 54 Initial lipase 1763 Abdominal ultrasound showed cholelithiasis and fatty liver MRCP showed multiple tiny gallstones.  Diffuse mild bladder wall thickening without inflammatory changes possibly secondary to pancreatitis.  Mild diffuse biliary ductal dilation with common bile duct measuring 8 mm.  Meniscus sign at junction of distal common bile duct and ampulla, impacted distal CBD stone cannot be excluded.  Diffuse pancreatic edema without necrosis or fluid collection.  Vital signs are stable without fevers  Past Medical History:  Diagnosis Date   Cesarean delivery delivered 01/04/2019   Chlamydia    Chronic hypertension during pregnancy, antepartum 07/30/2018   BP elevated 12/21, 01/29/18, 05/11/18, 07/29/18  Arly.Keller ] Aspirin 81 mg  daily after 12 weeks Current antihypertensives:  None   Baseline and surveillance labs (pulled in from [facility], refresh links as needed)  Lab Results  Component Value Date   PLT 218 07/29/2018   CREATININE 0.59 07/29/2018   AST 16 07/29/2018   ALT 15 07/29/2018              Antenatal Testing CHTN - O10.919  Group I  BP < 140/90, no preecl   Depression    doing ok now   Gonorrhea    H/O: C-section 01/04/2019   H/O: C-section 01/04/2019   History of cesarean delivery affecting pregnancy 10/03/2015   x1 in 2016, currently plans RLTCS  Declines TOLAC. Form signed 09/20/2018    History of cesarean delivery affecting pregnancy 10/03/2015   x1 in 2016, currently plans RLTCS  Declines TOLAC. Form signed 09/20/2018    History of cesarean delivery affecting pregnancy 10/03/2015   x1 in 2016, currently plans RLTCS  Declines TOLAC. Form signed 09/20/2018    History of pregnancy induced hypertension 12/06/2016   Hx of trichomoniasis    Hypertension    sometimes is up, never on meds   Mental disorder    Ovarian cyst    PID (pelvic inflammatory disease)    Sleep apnea     Surgical History:  She  has a past surgical history that includes Knee surgery; Cesarean section (N/A, 10/03/2015); Cesarean section (N/A, 01/04/2019); and Cesarean section (N/A). Family History:  Her family history includes Healthy in her brother, father, and mother. Social History:   reports that she has  been smoking cigars. She has never used smokeless tobacco. She reports current alcohol use. She reports current drug use. Drug: Marijuana.  Prior to Admission medications   Medication Sig Start Date End Date Taking? Authorizing Provider  albuterol (VENTOLIN HFA) 108 (90 Base) MCG/ACT inhaler Inhale 2 puffs into the lungs every 6 (six) hours as needed for wheezing or shortness of breath. 01/09/23  Yes Viviano Simas, FNP  doxycycline (ADOXA) 100 MG tablet Take 100 mg by mouth 2 (two) times daily.   Yes [provider]   metroNIDAZOLE (FLAGYL) 500 MG tablet Take 500 mg by mouth 2 (two) times daily.   Yes [provider]    Current Facility-Administered Medications  Medication Dose Route Frequency Provider Last Rate Last Admin   acetaminophen (TYLENOL) tablet 650 mg  650 mg Oral Q6H PRN Howerter, Justin B, DO       Or   acetaminophen (TYLENOL) suppository 650 mg  650 mg Rectal Q6H PRN Howerter, Justin B, DO       HYDROmorphone (DILAUDID) injection 0.5 mg  0.5 mg Intravenous Q2H PRN Howerter, Justin B, DO   0.5 mg at 06/21/23 0118   naloxone (NARCAN) injection 0.4 mg  0.4 mg Intravenous PRN Howerter, Justin B, DO       ondansetron (ZOFRAN) injection 4 mg  4 mg Intravenous Q6H PRN Howerter, Justin B, DO       potassium chloride 10 mEq in 100 mL IVPB  10 mEq Intravenous Q1 Hr x 3 Khatri, Pardeep, MD 100 mL/hr at 06/21/23 1202 10 mEq at 06/21/23 1202    Allergies as of 06/20/2023 - Review Complete 06/20/2023  Allergen Reaction Noted   Infuvite adult [multiple vitamin] Shortness Of Breath 06/09/2020   Nubain [nalbuphine hcl] Other (See Comments) 11/16/2017    Review of Systems  Constitutional:  Negative for chills, fever and weight loss.  HENT:  Negative for hearing loss and tinnitus.   Eyes:  Negative for blurred vision and double vision.  Respiratory:  Negative for cough and hemoptysis.   Cardiovascular:  Negative for chest pain and orthopnea.  Gastrointestinal:  Positive for abdominal pain, nausea and vomiting. Negative for blood in stool, constipation, diarrhea, heartburn and melena.  Genitourinary:  Negative for dysuria and urgency.  Musculoskeletal:  Negative for myalgias and neck pain.  Skin:  Negative for itching and rash.  Neurological:  Negative for seizures and loss of consciousness.  Psychiatric/Behavioral:  Negative for depression and suicidal ideas.        Physical Exam:  Vital signs in last 24 hours: Temp:  [97.9 F (36.6 C)-98.8 F (37.1 C)] 98.8 F (37.1 C) (07/25  0751) Pulse Rate:  [62-95] 65 (07/25 0751) Resp:  [11-25] 17 (07/25 0751) BP: (97-155)/(63-109) 105/65 (07/25 0751) SpO2:  [94 %-100 %] 100 % (07/25 0751) Weight:  [158.8 kg] 158.8 kg (07/24 1814) Last BM Date : 06/19/23 Last BM recorded by nurses in past 5 days No data recorded  Physical Exam Constitutional:      Appearance: She is obese.  HENT:     Head: Normocephalic and atraumatic.     Nose: Nose normal. No congestion.     Mouth/Throat:     Mouth: Mucous membranes are moist.     Pharynx: Oropharynx is clear.  Eyes:     General: Scleral icterus present.     Extraocular Movements: Extraocular movements intact.  Cardiovascular:     Rate and Rhythm: Normal rate and regular rhythm.  Pulmonary:     Effort:  Pulmonary effort is normal. No respiratory distress.  Abdominal:     General: Bowel sounds are normal. There is no distension.     Palpations: Abdomen is soft. There is no mass.     Tenderness: There is abdominal tenderness (Epigastric).     Hernia: No hernia is present.  Musculoskeletal:        General: No swelling. Normal range of motion.     Cervical back: Normal range of motion and neck supple.  Skin:    General: Skin is warm and dry.     Coloration: Skin is not jaundiced.  Neurological:     General: No focal deficit present.     Mental Status: She is alert and oriented to person, place, and time.  Psychiatric:        Mood and Affect: Mood normal.        Behavior: Behavior normal.        Thought Content: Thought content normal.        Judgment: Judgment normal.      LAB RESULTS: Recent Labs    06/20/23 1819 06/21/23 0310  WBC 12.2* 8.5  HGB 14.7 12.9  HCT 45.2 40.4  PLT 326 251   BMET Recent Labs    06/20/23 1819 06/21/23 0310  NA 137 136  K 3.4* 3.3*  CL 103 99  CO2 22 21*  GLUCOSE 132* 102*  BUN 5* 6  CREATININE 0.80 0.68  CALCIUM 8.9 8.6*   LFT Recent Labs    06/21/23 0310  PROT 6.4*  ALBUMIN 3.0*  AST 210*  ALT 338*  ALKPHOS 243*   BILITOT 4.1*  BILIDIR 2.4*   PT/INR Recent Labs    06/21/23 0817  LABPROT 13.6  INR 1.0    STUDIES: MR ABDOMEN MRCP W WO CONTAST  Result Date: 06/21/2023 CLINICAL DATA:  Epigastric abdominal pain and vomiting for 3 days. Tenderness to palpation. EXAM: MRI ABDOMEN WITHOUT AND WITH CONTRAST (INCLUDING MRCP) TECHNIQUE: Multiplanar multisequence MR imaging of the abdomen was performed both before and after the administration of intravenous contrast. Heavily T2-weighted images of the biliary and pancreatic ducts were obtained, and three-dimensional MRCP images were rendered by post processing. CONTRAST:  9mL GADAVIST GADOBUTROL 1 MMOL/ML IV SOLN COMPARISON:  None Available. FINDINGS: Lower chest: No acute findings. Hepatobiliary: No hepatic masses identified. Multiple tiny gallstones are noted. Gallbladder is nondistended, however mild diffuse gallbladder wall thickening is seen, without pericholecystic inflammatory changes. This is nonspecific and may be secondary to pancreatitis. Image degradation by motion artifact noted. Mild diffuse biliary ductal dilatation is seen, with common bile duct measuring up to 8 mm in diameter. A meniscus sign is seen at the junction of the distal common bile duct and ampulla, and an impacted distal common bile duct calculus cannot be excluded. No other biliary calculi identified. Pancreas: Diffuse pancreatic edema is seen, consistent with acute pancreatitis. No evidence of pancreatic mass or necrosis. No evidence of pancreatic ductal dilatation or pancreas divisum. No evidence of peripancreatic fluid collections. Spleen:  Within normal limits in size and appearance. Adrenals/Urinary Tract: No suspicious masses identified. No evidence of hydronephrosis. Stomach/Bowel: Unremarkable. Vascular/Lymphatic: No pathologically enlarged lymph nodes identified. No acute vascular findings. Other:  None. Musculoskeletal:  No suspicious bone lesions identified. IMPRESSION: Image  degradation by motion artifact noted. Mild acute pancreatitis. No evidence of pancreatic necrosis or pseudocysts. Cholelithiasis. Mild diffuse gallbladder wall thickening is nonspecific, and may be secondary to pancreatitis. Consider nuclear medicine HIDA scan if clinically warranted to exclude  cholecystitis. Mild diffuse biliary ductal dilatation, with possible impacted distal common bile duct calculus. Recommend correlation with liver function tests, and consider ERCP. Electronically Signed   By: Danae Orleans M.D.   On: 06/21/2023 11:09   MR 3D Recon At Scanner  Result Date: 06/21/2023 CLINICAL DATA:  Epigastric abdominal pain and vomiting for 3 days. Tenderness to palpation. EXAM: MRI ABDOMEN WITHOUT AND WITH CONTRAST (INCLUDING MRCP) TECHNIQUE: Multiplanar multisequence MR imaging of the abdomen was performed both before and after the administration of intravenous contrast. Heavily T2-weighted images of the biliary and pancreatic ducts were obtained, and three-dimensional MRCP images were rendered by post processing. CONTRAST:  9mL GADAVIST GADOBUTROL 1 MMOL/ML IV SOLN COMPARISON:  None Available. FINDINGS: Lower chest: No acute findings. Hepatobiliary: No hepatic masses identified. Multiple tiny gallstones are noted. Gallbladder is nondistended, however mild diffuse gallbladder wall thickening is seen, without pericholecystic inflammatory changes. This is nonspecific and may be secondary to pancreatitis. Image degradation by motion artifact noted. Mild diffuse biliary ductal dilatation is seen, with common bile duct measuring up to 8 mm in diameter. A meniscus sign is seen at the junction of the distal common bile duct and ampulla, and an impacted distal common bile duct calculus cannot be excluded. No other biliary calculi identified. Pancreas: Diffuse pancreatic edema is seen, consistent with acute pancreatitis. No evidence of pancreatic mass or necrosis. No evidence of pancreatic ductal dilatation or  pancreas divisum. No evidence of peripancreatic fluid collections. Spleen:  Within normal limits in size and appearance. Adrenals/Urinary Tract: No suspicious masses identified. No evidence of hydronephrosis. Stomach/Bowel: Unremarkable. Vascular/Lymphatic: No pathologically enlarged lymph nodes identified. No acute vascular findings. Other:  None. Musculoskeletal:  No suspicious bone lesions identified. IMPRESSION: Image degradation by motion artifact noted. Mild acute pancreatitis. No evidence of pancreatic necrosis or pseudocysts. Cholelithiasis. Mild diffuse gallbladder wall thickening is nonspecific, and may be secondary to pancreatitis. Consider nuclear medicine HIDA scan if clinically warranted to exclude cholecystitis. Mild diffuse biliary ductal dilatation, with possible impacted distal common bile duct calculus. Recommend correlation with liver function tests, and consider ERCP. Electronically Signed   By: Danae Orleans M.D.   On: 06/21/2023 11:09   US Abdomen Limited RUQ (LIVER/GB)  Result Date: 06/20/2023 CLINICAL DATA:  Right upper quadrant abdominal pain. EXAM: ULTRASOUND ABDOMEN LIMITED RIGHT UPPER QUADRANT COMPARISON:  Ultrasound dated 06/07/2019. FINDINGS: Gallbladder: Multiple gallstones. No gallbladder wall thickening or pericholecystic fluid. Negative sonographic Murphy's sign. Common bile duct: Diameter: 7 mm Liver: There is diffuse increased liver echogenicity most commonly seen in the setting of fatty infiltration. Superimposed inflammation or fibrosis is not excluded. Clinical correlation is recommended. A 1.9 cm echogenic structure in the porta hepatis likely represents periportal fat or tissue interface. Portal vein is patent on color Doppler imaging with normal direction of blood flow towards the liver. Other: None. IMPRESSION: 1. Cholelithiasis without sonographic evidence of acute cholecystitis. 2. Fatty liver. Electronically Signed   By: Elgie Collard M.D.   On: 06/20/2023 22:21       Impression    32 year old female with history of hypertension presented with nausea, vomiting, epigastric pain.  MRCP shows gallstone pancreatitis with possible impacted CBD stone.  Obstructive LFTs.  Initial lipase 1736, normal triglycerides, no leukocytosis.  No previous episodes of pancreatitis  Acute pancreatitis likely be secondary to gallstones With double lithiasis No leukocytosis AST 210/ALT 338/alk phos 243, improving T. bili 4.1 (3.2) Direct bilirubin 2.4 GGT 587 Triglycerides 54 Initial lipase 1763 Abdominal ultrasound showed cholelithiasis and fatty  liver MRCP showed multiple tiny gallstones.  Diffuse mild bladder wall thickening without inflammatory changes possibly secondary to pancreatitis.  Mild diffuse biliary ductal dilation with common bile duct measuring 8 mm.  Meniscus sign at junction of distal common bile duct and ampulla, impacted distal CBD stone cannot be excluded.  Diffuse pancreatic edema without necrosis or fluid collection.   Plan   -Fluid replacement lactated ringers 125-150cc/hr (3 cc/kg/hr) can decrease after 12-24 hours to 100-125cc/hr. -Watch for hypervolemia -Pain control per the inpatient medical team. -Encourage early ambulation -Trend CBC, CMP, lipase. -Due to current pancreatitis, would hold off on ERCP until pancreatitis resolves/improves.  Will trend LFTs and provide supportive care.  Low suspicion for cholangitis without fevers or leukocytosis  Thank you for your kind consultation, we will continue to follow.  Jezabella Schriever Leanna Sato  06/21/2023, 12:36 PM

## 2023-06-21 NOTE — Progress Notes (Addendum)
PROGRESS NOTE    Alexandra Gray  GNF:[id number] DOB: 22-Dec-1990 DOA: 06/20/2023 PCP: Fleet Contras, MD   Brief Narrative:  This 32 years old female with PMH significant for essential hypertension presented in the ED with complaints of abdominal pain associated with nausea and vomiting radiating towards her back.  She is found to have acute biliary pancreatitis on CT scan along with elevated lipase level.  Significant labs in the ED: Lipase 1763, AST 369 ALT 459, total bilirubin 3.2,  WBC 12.2.  UA negative.  Right upper quadrant ultrasound showed cholelithiasis without evidence of acute cholecystitis, no evidence of Choledocholithiasis.  Patient is admitted for further evaluation. She is started on IV fluid, bowel rest. Gastroenterology is consulted.  Assessment & Plan:   Principal Problem:   Acute pancreatitis Active Problems:   History of essential hypertension   Nausea & vomiting   Epigastric pain   SIRS (systemic inflammatory response syndrome) (HCC)   Hypokalemia   Transaminitis   Acute pancreatitis: Patient presented with 3-day history of progressive abdominal pain associated with nausea and vomiting radiating towards the back.   Serum lipase 1763, AST 369, ALT 459, total bilirubin 2.9, WBC 12.2.   Right upper quadrant ultrasound showed no evidence of choledocholithiasis or evidence of acute cholecystitis. Continue IV fluid resuscitation, LR 125 cc/hr. Continue IV pain medications. Continue IV Zofran and Phenergan for nausea and vomiting N.p.o. and monitor clinical status. MRCP  showed multiple tiny gallstones  GI consulted.  Hypokalemia: Replaced.  Continue to monitor   History of essential hypertension: Patient not on any antihypertensive medication. Monitor BP and consider antihypertensive.    DVT prophylaxis: Lovenox Code Status: Full code Family Communication: No family at bed side. Disposition Plan:    Status is: Inpatient Remains inpatient appropriate  because: Admitted for acute pancreatitis    Consultants:  Gastroenterology  Procedures: MRCP  Antimicrobials:  Anti-infectives (From admission, onward)    None       Subjective: Patient was seen and examined at bedside.  Overnight events noted.   Patient still reports having abdominal pain, associated with Nausea and vomiting.  Objective: Vitals:   06/21/23 0000 06/21/23 0107 06/21/23 0402 06/21/23 0751  BP:  115/68 97/63 105/65  Pulse: 70 69 62 65  Resp: 11 15 16 17   Temp:  97.9 F (36.6 C) 98.2 F (36.8 C) 98.8 F (37.1 C)  TempSrc:  Oral Oral Oral  SpO2: 94% 100% 100% 100%  Weight:      Height:        Intake/Output Summary (Last 24 hours) at 06/21/2023 1453 Last data filed at 06/21/2023 0700 Gross per 24 hour  Intake 938.76 ml  Output 250 ml  Net 688.76 ml   Filed Weights   06/20/23 1814  Weight: (!) 158.8 kg    Examination:  General exam: Appears calm and comfortable, not in any acute distress. Respiratory system: CTA bilaterally. Respiratory effort normal.  RR 15 Cardiovascular system: S1 & S2 heard, regular rate and rhythm, no murmur.  No pedal edema. Gastrointestinal system: Abdomen is soft, mildly tender, nondistended, bowel sounds present Central nervous system: Alert and oriented x 3. No focal neurological deficits. Extremities: Symmetric 5 x 5 power. Skin: No rashes, lesions or ulcers Psychiatry: Judgement and insight appear normal. Mood & affect appropriate.     Data Reviewed: I have personally reviewed following labs and imaging studies  CBC: Recent Labs  Lab 06/20/23 1819 06/21/23 0310  WBC 12.2* 8.5  NEUTROABS  --  5.7  HGB 14.7 12.9  HCT 45.2 40.4  MCV 85.3 84.7  PLT 326 251   Basic Metabolic Panel: Recent Labs  Lab 06/20/23 1819 06/21/23 0310  NA 137 136  K 3.4* 3.3*  CL 103 99  CO2 22 21*  GLUCOSE 132* 102*  BUN 5* 6  CREATININE 0.80 0.68  CALCIUM 8.9 8.6*  MG  --  2.0   GFR: Estimated Creatinine Clearance:  144.7 mL/min (by C-G formula based on SCr of 0.68 mg/dL). Liver Function Tests: Recent Labs  Lab 06/20/23 1819 06/21/23 0310  AST 369* 210*  ALT 459* 338*  ALKPHOS 293* 243*  BILITOT 3.2* 4.1*  PROT 7.8 6.4*  ALBUMIN 3.7 3.0*   Recent Labs  Lab 06/20/23 1819 06/21/23 0817  LIPASE 1,763* 391*   No results for input(s): "AMMONIA" in the last 168 hours. Coagulation Profile: Recent Labs  Lab 06/21/23 0817  INR 1.0   Cardiac Enzymes: No results for input(s): "CKTOTAL", "CKMB", "CKMBINDEX", "TROPONINI" in the last 168 hours. BNP (last 3 results) No results for input(s): "PROBNP" in the last 8760 hours. HbA1C: No results for input(s): "HGBA1C" in the last 72 hours. CBG: No results for input(s): "GLUCAP" in the last 168 hours. Lipid Profile: Recent Labs    06/21/23 0338  TRIG 54   Thyroid Function Tests: No results for input(s): "TSH", "T4TOTAL", "FREET4", "T3FREE", "THYROIDAB" in the last 72 hours. Anemia Panel: No results for input(s): "VITAMINB12", "FOLATE", "FERRITIN", "TIBC", "IRON", "RETICCTPCT" in the last 72 hours. Sepsis Labs: No results for input(s): "PROCALCITON", "LATICACIDVEN" in the last 168 hours.  No results found for this or any previous visit (from the past 240 hour(s)).   Radiology Studies: MR ABDOMEN MRCP W WO CONTAST  Result Date: 06/21/2023 CLINICAL DATA:  Epigastric abdominal pain and vomiting for 3 days. Tenderness to palpation. EXAM: MRI ABDOMEN WITHOUT AND WITH CONTRAST (INCLUDING MRCP) TECHNIQUE: Multiplanar multisequence MR imaging of the abdomen was performed both before and after the administration of intravenous contrast. Heavily T2-weighted images of the biliary and pancreatic ducts were obtained, and three-dimensional MRCP images were rendered by post processing. CONTRAST:  9mL GADAVIST GADOBUTROL 1 MMOL/ML IV SOLN COMPARISON:  None Available. FINDINGS: Lower chest: No acute findings. Hepatobiliary: No hepatic masses identified. Multiple  tiny gallstones are noted. Gallbladder is nondistended, however mild diffuse gallbladder wall thickening is seen, without pericholecystic inflammatory changes. This is nonspecific and may be secondary to pancreatitis. Image degradation by motion artifact noted. Mild diffuse biliary ductal dilatation is seen, with common bile duct measuring up to 8 mm in diameter. A meniscus sign is seen at the junction of the distal common bile duct and ampulla, and an impacted distal common bile duct calculus cannot be excluded. No other biliary calculi identified. Pancreas: Diffuse pancreatic edema is seen, consistent with acute pancreatitis. No evidence of pancreatic mass or necrosis. No evidence of pancreatic ductal dilatation or pancreas divisum. No evidence of peripancreatic fluid collections. Spleen:  Within normal limits in size and appearance. Adrenals/Urinary Tract: No suspicious masses identified. No evidence of hydronephrosis. Stomach/Bowel: Unremarkable. Vascular/Lymphatic: No pathologically enlarged lymph nodes identified. No acute vascular findings. Other:  None. Musculoskeletal:  No suspicious bone lesions identified. IMPRESSION: Image degradation by motion artifact noted. Mild acute pancreatitis. No evidence of pancreatic necrosis or pseudocysts. Cholelithiasis. Mild diffuse gallbladder wall thickening is nonspecific, and may be secondary to pancreatitis. Consider nuclear medicine HIDA scan if clinically warranted to exclude cholecystitis. Mild diffuse biliary ductal dilatation, with possible impacted distal common bile duct calculus.  Recommend correlation with liver function tests, and consider ERCP. Electronically Signed   By: Danae Orleans M.D.   On: 06/21/2023 11:09   MR 3D Recon At Scanner  Result Date: 06/21/2023 CLINICAL DATA:  Epigastric abdominal pain and vomiting for 3 days. Tenderness to palpation. EXAM: MRI ABDOMEN WITHOUT AND WITH CONTRAST (INCLUDING MRCP) TECHNIQUE: Multiplanar multisequence MR  imaging of the abdomen was performed both before and after the administration of intravenous contrast. Heavily T2-weighted images of the biliary and pancreatic ducts were obtained, and three-dimensional MRCP images were rendered by post processing. CONTRAST:  9mL GADAVIST GADOBUTROL 1 MMOL/ML IV SOLN COMPARISON:  None Available. FINDINGS: Lower chest: No acute findings. Hepatobiliary: No hepatic masses identified. Multiple tiny gallstones are noted. Gallbladder is nondistended, however mild diffuse gallbladder wall thickening is seen, without pericholecystic inflammatory changes. This is nonspecific and may be secondary to pancreatitis. Image degradation by motion artifact noted. Mild diffuse biliary ductal dilatation is seen, with common bile duct measuring up to 8 mm in diameter. A meniscus sign is seen at the junction of the distal common bile duct and ampulla, and an impacted distal common bile duct calculus cannot be excluded. No other biliary calculi identified. Pancreas: Diffuse pancreatic edema is seen, consistent with acute pancreatitis. No evidence of pancreatic mass or necrosis. No evidence of pancreatic ductal dilatation or pancreas divisum. No evidence of peripancreatic fluid collections. Spleen:  Within normal limits in size and appearance. Adrenals/Urinary Tract: No suspicious masses identified. No evidence of hydronephrosis. Stomach/Bowel: Unremarkable. Vascular/Lymphatic: No pathologically enlarged lymph nodes identified. No acute vascular findings. Other:  None. Musculoskeletal:  No suspicious bone lesions identified. IMPRESSION: Image degradation by motion artifact noted. Mild acute pancreatitis. No evidence of pancreatic necrosis or pseudocysts. Cholelithiasis. Mild diffuse gallbladder wall thickening is nonspecific, and may be secondary to pancreatitis. Consider nuclear medicine HIDA scan if clinically warranted to exclude cholecystitis. Mild diffuse biliary ductal dilatation, with possible  impacted distal common bile duct calculus. Recommend correlation with liver function tests, and consider ERCP. Electronically Signed   By: Danae Orleans M.D.   On: 06/21/2023 11:09   US Abdomen Limited RUQ (LIVER/GB)  Result Date: 06/20/2023 CLINICAL DATA:  Right upper quadrant abdominal pain. EXAM: ULTRASOUND ABDOMEN LIMITED RIGHT UPPER QUADRANT COMPARISON:  Ultrasound dated 06/07/2019. FINDINGS: Gallbladder: Multiple gallstones. No gallbladder wall thickening or pericholecystic fluid. Negative sonographic Murphy's sign. Common bile duct: Diameter: 7 mm Liver: There is diffuse increased liver echogenicity most commonly seen in the setting of fatty infiltration. Superimposed inflammation or fibrosis is not excluded. Clinical correlation is recommended. A 1.9 cm echogenic structure in the porta hepatis likely represents periportal fat or tissue interface. Portal vein is patent on color Doppler imaging with normal direction of blood flow towards the liver. Other: None. IMPRESSION: 1. Cholelithiasis without sonographic evidence of acute cholecystitis. 2. Fatty liver. Electronically Signed   By: Elgie Collard M.D.   On: 06/20/2023 22:21    Scheduled Meds: Continuous Infusions:     LOS: 1 day    Time spent: 50 mins    Willeen Niece, MD [facility]   If 7PM-7AM, please contact night-coverage

## 2023-06-22 DIAGNOSIS — K8050 Calculus of bile duct without cholangitis or cholecystitis without obstruction: Secondary | ICD-10-CM

## 2023-06-22 DIAGNOSIS — K851 Biliary acute pancreatitis without necrosis or infection: Secondary | ICD-10-CM

## 2023-06-22 MED ORDER — SODIUM CHLORIDE 0.9 % IV SOLN: INTRAVENOUS | Status: DC

## 2023-06-22 MED ORDER — INDOCYANINE GREEN 25 MG IV SOLR
1.2500 mg | Freq: Once | INTRAVENOUS | Status: AC
  Administered 2023-06-23: 1.25 mg via INTRAVENOUS
  Filled 2023-06-22: qty 10

## 2023-06-22 MED ORDER — ENOXAPARIN SODIUM 40 MG/0.4ML IJ SOSY
40.0000 mg | PREFILLED_SYRINGE | INTRAMUSCULAR | Status: DC
  Administered 2023-06-23: 40 mg via SUBCUTANEOUS
  Filled 2023-06-22: qty 0.4

## 2023-06-22 NOTE — Progress Notes (Signed)
PROGRESS NOTE    Alexandra Gray  ONG:[id number] DOB: [birth year] DOA: 06/20/2023 PCP: Fleet Contras, MD   Brief Narrative:  This 32 years old female with PMH significant for essential hypertension presented in the ED with complaints of abdominal pain associated with nausea and vomiting radiating towards her back. She is found to have acute biliary pancreatitis on CT scan along with elevated lipase level.  Significant labs in the ED: Lipase 1763, AST 369 ALT 459, total bilirubin 3.2,  WBC 12.2.  UA negative.  Right upper quadrant ultrasound showed cholelithiasis without evidence of acute cholecystitis, no evidence of Choledocholithiasis.  Patient is admitted for further evaluation. She is started on IV fluid, bowel rest. Gastroenterology is consulted.  Assessment & Plan:   Principal Problem:   Acute pancreatitis Active Problems:   History of essential hypertension   Nausea & vomiting   Epigastric pain   SIRS (systemic inflammatory response syndrome) (HCC)   Hypokalemia   Transaminitis   Acute pancreatitis: Patient presented with 3-day history of progressive abdominal pain associated with nausea and vomiting radiating towards the back.   Serum lipase 1763, AST 369, ALT 459, total bilirubin 2.9, WBC 12.2.   Right upper quadrant ultrasound showed multiple gall stones , no evidence of choledocholithiasis or evidence of acute cholecystitis. Continue IV fluid resuscitation, LR 125 cc/hr. Continue IV pain medications. Continue IV Zofran and Phenergan for nausea and vomiting Started on clear liquid diet,  advance to full liquids. MRCP showed multiple tiny gallstones,   GI consulted. Improvement in LFTs and clinical symptoms.  It seems like impacted CBD stone has passed and may be patient may not need ERCP.  Multiple gallstones: General surgery consulted to evaluate for possible cholecystectomy.  Elevated liver enzymes: Likely due to above.  LFTs are trending  down.  Hypokalemia: Replaced.  Continue to monitor  History of essential hypertension: Patient not on any antihypertensive medication. Monitor BP and consider antihypertensive.   DVT prophylaxis: Lovenox Code Status: Full code Family Communication: No family at bed side. Disposition Plan:    Status is: Inpatient Remains inpatient appropriate because: Admitted for acute pancreatitis.    Consultants:  Gastroenterology  Procedures: MRCP  Antimicrobials:  Anti-infectives (From admission, onward)    None       Subjective: Patient was seen and examined at bedside.  Overnight events noted.   Patient reports doing much better. She reports nausea and vomiting has improved.   She wants to remove the gallstones.  Objective: Vitals:   06/21/23 2000 06/22/23 0406 06/22/23 0406 06/22/23 0722  BP: 100/60  130/88 122/65  Pulse: 73  67 70  Resp: 19 18 19 16   Temp: 98.3 F (36.8 C)  98.4 F (36.9 C) 98.6 F (37 C)  TempSrc: Oral  Oral Oral  SpO2: 100%  100% 100%  Weight:   (!) 143.4 kg   Height:        Intake/Output Summary (Last 24 hours) at 06/22/2023 1450 Last data filed at 06/21/2023 2000 Gross per 24 hour  Intake 240 ml  Output --  Net 240 ml   Filed Weights   06/20/23 1814 06/22/23 0406  Weight: (!) 158.8 kg (!) 143.4 kg    Examination:  General exam: Appears calm and comfortable, not in any acute distress. Respiratory system: CTA bilaterally. Respiratory effort normal.  RR 15 Cardiovascular system: S1 & S2 heard, regular rate and rhythm, no murmur.  No pedal edema. Gastrointestinal system: Abdomen is soft, mildly tender, nondistended, bowel sounds present Central  nervous system: Alert and oriented x 3. No focal neurological deficits. Extremities: Symmetric 5 x 5 power. Skin: No rashes, lesions or ulcers Psychiatry: Judgement and insight appear normal. Mood & affect appropriate.     Data Reviewed: I have personally reviewed following labs and imaging  studies  CBC: Recent Labs  Lab 06/20/23 1819 06/21/23 0310 06/22/23 0000  WBC 12.2* 8.5 9.2  NEUTROABS  --  5.7  --   HGB 14.7 12.9 13.1  HCT 45.2 40.4 41.1  MCV 85.3 84.7 84.2  PLT 326 251 258   Basic Metabolic Panel: Recent Labs  Lab 06/20/23 1819 06/21/23 0310 06/22/23 0000  NA 137 136 135  K 3.4* 3.3* 3.4*  CL 103 99 101  CO2 22 21* 24  GLUCOSE 132* 102* 79  BUN 5* 6 7  CREATININE 0.80 0.68 0.69  CALCIUM 8.9 8.6* 8.6*  MG  --  2.0 2.0  PHOS  --   --  3.4   GFR: Estimated Creatinine Clearance: 135 mL/min (by C-G formula based on SCr of 0.69 mg/dL). Liver Function Tests: Recent Labs  Lab 06/20/23 1819 06/21/23 0310 06/22/23 0000  AST 369* 210* 79*  ALT 459* 338* 237*  ALKPHOS 293* 243* 231*  BILITOT 3.2* 4.1* 1.4*  PROT 7.8 6.4* 6.9  ALBUMIN 3.7 3.0* 3.2*   Recent Labs  Lab 06/20/23 1819 06/21/23 0817  LIPASE 1,763* 391*   No results for input(s): "AMMONIA" in the last 168 hours. Coagulation Profile: Recent Labs  Lab 06/21/23 0817  INR 1.0   Cardiac Enzymes: No results for input(s): "CKTOTAL", "CKMB", "CKMBINDEX", "TROPONINI" in the last 168 hours. BNP (last 3 results) No results for input(s): "PROBNP" in the last 8760 hours. HbA1C: No results for input(s): "HGBA1C" in the last 72 hours. CBG: No results for input(s): "GLUCAP" in the last 168 hours. Lipid Profile: Recent Labs    06/21/23 0338  TRIG 54   Thyroid Function Tests: No results for input(s): "TSH", "T4TOTAL", "FREET4", "T3FREE", "THYROIDAB" in the last 72 hours. Anemia Panel: No results for input(s): "VITAMINB12", "FOLATE", "FERRITIN", "TIBC", "IRON", "RETICCTPCT" in the last 72 hours. Sepsis Labs: No results for input(s): "PROCALCITON", "LATICACIDVEN" in the last 168 hours.  No results found for this or any previous visit (from the past 240 hour(s)).   Radiology Studies: MR ABDOMEN MRCP W WO CONTAST  Result Date: 06/21/2023 CLINICAL DATA:  Epigastric abdominal pain and  vomiting for 3 days. Tenderness to palpation. EXAM: MRI ABDOMEN WITHOUT AND WITH CONTRAST (INCLUDING MRCP) TECHNIQUE: Multiplanar multisequence MR imaging of the abdomen was performed both before and after the administration of intravenous contrast. Heavily T2-weighted images of the biliary and pancreatic ducts were obtained, and three-dimensional MRCP images were rendered by post processing. CONTRAST:  9mL GADAVIST GADOBUTROL 1 MMOL/ML IV SOLN COMPARISON:  None Available. FINDINGS: Lower chest: No acute findings. Hepatobiliary: No hepatic masses identified. Multiple tiny gallstones are noted. Gallbladder is nondistended, however mild diffuse gallbladder wall thickening is seen, without pericholecystic inflammatory changes. This is nonspecific and may be secondary to pancreatitis. Image degradation by motion artifact noted. Mild diffuse biliary ductal dilatation is seen, with common bile duct measuring up to 8 mm in diameter. A meniscus sign is seen at the junction of the distal common bile duct and ampulla, and an impacted distal common bile duct calculus cannot be excluded. No other biliary calculi identified. Pancreas: Diffuse pancreatic edema is seen, consistent with acute pancreatitis. No evidence of pancreatic mass or necrosis. No evidence of pancreatic ductal  dilatation or pancreas divisum. No evidence of peripancreatic fluid collections. Spleen:  Within normal limits in size and appearance. Adrenals/Urinary Tract: No suspicious masses identified. No evidence of hydronephrosis. Stomach/Bowel: Unremarkable. Vascular/Lymphatic: No pathologically enlarged lymph nodes identified. No acute vascular findings. Other:  None. Musculoskeletal:  No suspicious bone lesions identified. IMPRESSION: Image degradation by motion artifact noted. Mild acute pancreatitis. No evidence of pancreatic necrosis or pseudocysts. Cholelithiasis. Mild diffuse gallbladder wall thickening is nonspecific, and may be secondary to  pancreatitis. Consider nuclear medicine HIDA scan if clinically warranted to exclude cholecystitis. Mild diffuse biliary ductal dilatation, with possible impacted distal common bile duct calculus. Recommend correlation with liver function tests, and consider ERCP. Electronically Signed   By: Danae Orleans M.D.   On: 06/21/2023 11:09   MR 3D Recon At Scanner  Result Date: 06/21/2023 CLINICAL DATA:  Epigastric abdominal pain and vomiting for 3 days. Tenderness to palpation. EXAM: MRI ABDOMEN WITHOUT AND WITH CONTRAST (INCLUDING MRCP) TECHNIQUE: Multiplanar multisequence MR imaging of the abdomen was performed both before and after the administration of intravenous contrast. Heavily T2-weighted images of the biliary and pancreatic ducts were obtained, and three-dimensional MRCP images were rendered by post processing. CONTRAST:  9mL GADAVIST GADOBUTROL 1 MMOL/ML IV SOLN COMPARISON:  None Available. FINDINGS: Lower chest: No acute findings. Hepatobiliary: No hepatic masses identified. Multiple tiny gallstones are noted. Gallbladder is nondistended, however mild diffuse gallbladder wall thickening is seen, without pericholecystic inflammatory changes. This is nonspecific and may be secondary to pancreatitis. Image degradation by motion artifact noted. Mild diffuse biliary ductal dilatation is seen, with common bile duct measuring up to 8 mm in diameter. A meniscus sign is seen at the junction of the distal common bile duct and ampulla, and an impacted distal common bile duct calculus cannot be excluded. No other biliary calculi identified. Pancreas: Diffuse pancreatic edema is seen, consistent with acute pancreatitis. No evidence of pancreatic mass or necrosis. No evidence of pancreatic ductal dilatation or pancreas divisum. No evidence of peripancreatic fluid collections. Spleen:  Within normal limits in size and appearance. Adrenals/Urinary Tract: No suspicious masses identified. No evidence of hydronephrosis.  Stomach/Bowel: Unremarkable. Vascular/Lymphatic: No pathologically enlarged lymph nodes identified. No acute vascular findings. Other:  None. Musculoskeletal:  No suspicious bone lesions identified. IMPRESSION: Image degradation by motion artifact noted. Mild acute pancreatitis. No evidence of pancreatic necrosis or pseudocysts. Cholelithiasis. Mild diffuse gallbladder wall thickening is nonspecific, and may be secondary to pancreatitis. Consider nuclear medicine HIDA scan if clinically warranted to exclude cholecystitis. Mild diffuse biliary ductal dilatation, with possible impacted distal common bile duct calculus. Recommend correlation with liver function tests, and consider ERCP. Electronically Signed   By: Danae Orleans M.D.   On: 06/21/2023 11:09   US Abdomen Limited RUQ (LIVER/GB)  Result Date: 06/20/2023 CLINICAL DATA:  Right upper quadrant abdominal pain. EXAM: ULTRASOUND ABDOMEN LIMITED RIGHT UPPER QUADRANT COMPARISON:  Ultrasound dated 06/07/2019. FINDINGS: Gallbladder: Multiple gallstones. No gallbladder wall thickening or pericholecystic fluid. Negative sonographic Murphy's sign. Common bile duct: Diameter: 7 mm Liver: There is diffuse increased liver echogenicity most commonly seen in the setting of fatty infiltration. Superimposed inflammation or fibrosis is not excluded. Clinical correlation is recommended. A 1.9 cm echogenic structure in the porta hepatis likely represents periportal fat or tissue interface. Portal vein is patent on color Doppler imaging with normal direction of blood flow towards the liver. Other: None. IMPRESSION: 1. Cholelithiasis without sonographic evidence of acute cholecystitis. 2. Fatty liver. Electronically Signed   By: Elgie Collard  M.D.   On: 06/20/2023 22:21    Scheduled Meds: Continuous Infusions:     LOS: 2 days    Time spent: 35 mins    Willeen Niece, MD [facility]   If 7PM-7AM, please contact night-coverage

## 2023-06-22 NOTE — Plan of Care (Signed)
  Problem: Education: °Goal: Knowledge of General Education information will improve °Description: Including pain rating scale, medication(s)/side effects and non-pharmacologic comfort measures °Outcome: Progressing °  °Problem: Clinical Measurements: °Goal: Ability to maintain clinical measurements within normal limits will improve °Outcome: Progressing °Goal: Will remain free from infection °Outcome: Progressing °  °Problem: Activity: °Goal: Risk for activity intolerance will decrease °Outcome: Progressing °  °Problem: Nutrition: °Goal: Adequate nutrition will be maintained °Outcome: Progressing °  °Problem: Elimination: °Goal: Will not experience complications related to bowel motility °Outcome: Progressing °  °Problem: Pain Managment: °Goal: General experience of comfort will improve °Outcome: Progressing °  °Problem: Safety: °Goal: Ability to remain free from injury will improve °Outcome: Progressing °  °Problem: Skin Integrity: °Goal: Risk for impaired skin integrity will decrease °Outcome: Progressing °  °

## 2023-06-22 NOTE — Consult Note (Signed)
Reason for Consult:ab pain Referring Physician: Dr Fayrene Helper is an 32 y.o. female.  HPI: 69 yof with MO, htn who presents with ab pain.  She was admitted recently by medicine. She describes history of some ab pain that always resolves in ruq / epigastrium. This episode did not resolve.  Was associated with epigastric pain to back, worse with eating, associated with n/v.  She underwent eval in the ER and was found to have elevated TB and lipase.  Ruq u/s shows cholelithiasis.  MRCP shows mild pancreatitis, cholelithiasis and possible impacted cbd stone.  Labs and symptoms have improved.  Last TB was 1.4 and pain is pretty minimal. She is tolerating PO.  Past Medical History:  Diagnosis Date   Cesarean delivery delivered 01/04/2019   Chlamydia    Chronic hypertension during pregnancy, antepartum 07/30/2018   BP elevated 12/21, 01/29/18, 05/11/18, 07/29/18  Arly.Keller ] Aspirin 81 mg daily after 12 weeks Current antihypertensives:  None   Baseline and surveillance labs (pulled in from [facility], refresh links as needed)  Lab Results  Component Value Date   PLT 218 07/29/2018   CREATININE 0.59 07/29/2018   AST 16 07/29/2018   ALT 15 07/29/2018              Antenatal Testing CHTN - O10.919  Group I  BP < 140/90, no preecl   Depression    doing ok now   Gonorrhea    H/O: C-section 01/04/2019   H/O: C-section 01/04/2019   History of cesarean delivery affecting pregnancy 10/03/2015   x1 in 2016, currently plans RLTCS  Declines TOLAC. Form signed 09/20/2018    History of cesarean delivery affecting pregnancy 10/03/2015   x1 in 2016, currently plans RLTCS  Declines TOLAC. Form signed 09/20/2018    History of cesarean delivery affecting pregnancy 10/03/2015   x1 in 2016, currently plans RLTCS  Declines TOLAC. Form signed 09/20/2018    History of pregnancy induced hypertension 12/06/2016   Hx of trichomoniasis    Hypertension    sometimes is up, never on meds   Mental disorder    Ovarian cyst    PID  (pelvic inflammatory disease)    Sleep apnea     Past Surgical History:  Procedure Laterality Date   CESAREAN SECTION N/A 10/03/2015   Procedure: CESAREAN SECTION;  Surgeon: Tilda Burrow, MD;  Location: [facility];  Service: Obstetrics;  Laterality: N/A;   CESAREAN SECTION N/A 01/04/2019   Procedure: CESAREAN SECTION;  Surgeon: Hermina Staggers, MD;  Location: [facility];  Service: Obstetrics;  Laterality: N/A;   CESAREAN SECTION N/A    Phreesia 03/08/2021   KNEE SURGERY      Family History  Problem Relation Age of Onset   Healthy Mother    Healthy Father    Healthy Brother    Cancer Neg Hx    Diabetes Neg Hx    Heart disease Neg Hx    Hypertension Neg Hx    Stroke Neg Hx    Hearing loss Neg Hx     Social History:  reports that she has been smoking cigars. She has never used smokeless tobacco. She reports current alcohol use. She reports current drug use. Drug: Marijuana.  Allergies:  Allergies  Allergen Reactions   Infuvite Adult [Multiple Vitamin] Shortness Of Breath   Nubain [Nalbuphine Hcl] Other (See Comments)    Makes patient hot    Current Facility-Administered Medications  Medication Dose Route Frequency Provider Last Rate  Last Admin   0.9 %  sodium chloride infusion   Intravenous Continuous Willeen Niece, MD       acetaminophen (TYLENOL) tablet 650 mg  650 mg Oral Q6H PRN Howerter, Justin B, DO   650 mg at 06/22/23 1117   Or   acetaminophen (TYLENOL) suppository 650 mg  650 mg Rectal Q6H PRN Howerter, Justin B, DO       enoxaparin (LOVENOX) injection 40 mg  40 mg Subcutaneous Q24H Emelia Loron, MD       HYDROmorphone (DILAUDID) injection 0.5 mg  0.5 mg Intravenous Q2H PRN Howerter, Justin B, DO   0.5 mg at 06/21/23 0118   [START ON 06/23/2023] indocyanine green (IC-GREEN) injection 1.25 mg  1.25 mg Intravenous Once Emelia Loron, MD       naloxone [facility]) injection 0.4 mg  0.4 mg Intravenous PRN Howerter, Justin B, DO       ondansetron  (ZOFRAN) injection 4 mg  4 mg Intravenous Q6H PRN Howerter, Justin B, DO         Results for orders placed or performed during the hospital encounter of 06/20/23 (from the past 48 hour(s))  Lipase, blood     Status: Abnormal   Collection Time: 06/20/23  6:19 PM  Result Value Ref Range   Lipase 1,763 (H) 11 - 51 U/L    Comment: RESULTS CONFIRMED BY MANUAL DILUTION Performed at [facility], [address] [place], [place] [address]   Comprehensive metabolic panel     Status: Abnormal   Collection Time: 06/20/23  6:19 PM  Result Value Ref Range   Sodium 137 135 - 145 mmol/L   Potassium 3.4 (L) 3.5 - 5.1 mmol/L   Chloride 103 98 - 111 mmol/L   CO2 22 22 - 32 mmol/L   Glucose, Bld 132 (H) 70 - 99 mg/dL    Comment: Glucose reference range applies only to samples taken after fasting for at least 8 hours.   BUN 5 (L) 6 - 20 mg/dL   Creatinine, Ser 6.21 0.44 - 1.00 mg/dL   Calcium 8.9 8.9 - 30.8 mg/dL   Total Protein 7.8 6.5 - 8.1 g/dL   Albumin 3.7 3.5 - 5.0 g/dL   AST 657 (H) 15 - 41 U/L   ALT 459 (H) 0 - 44 U/L   Alkaline Phosphatase 293 (H) 38 - 126 U/L   Total Bilirubin 3.2 (H) 0.3 - 1.2 mg/dL   GFR, Estimated >84 >69 mL/min    Comment: (NOTE) Calculated using the CKD-EPI Creatinine Equation (2021)    Anion gap 12 5 - 15    Comment: Performed at [facility], [address] [place], [place] [address]  CBC     Status: Abnormal   Collection Time: 06/20/23  6:19 PM  Result Value Ref Range   WBC 12.2 (H) 4.0 - 10.5 K/uL   RBC 5.30 (H) 3.87 - 5.11 MIL/uL   Hemoglobin 14.7 12.0 - 15.0 g/dL   HCT 84.1 32.4 - 40.1 %   MCV 85.3 80.0 - 100.0 fL   MCH 27.7 26.0 - 34.0 pg   MCHC 32.5 30.0 - 36.0 g/dL   RDW 02.7 25.3 - 66.4 %   Platelets 326 150 - 400 K/uL   nRBC 0.0 0.0 - 0.2 %    Comment: Performed at [facility], [address] [place], [place] [address]  Urinalysis, Routine w reflex microscopic -Urine, Clean Catch     Status: Abnormal  Collection Time:  06/20/23  6:25 PM  Result Value Ref Range   Color, Urine AMBER (A) YELLOW    Comment: BIOCHEMICALS MAY BE AFFECTED BY COLOR   APPearance CLEAR CLEAR   Specific Gravity, Urine 1.011 1.005 - 1.030   pH 5.0 5.0 - 8.0   Glucose, UA NEGATIVE NEGATIVE mg/dL   Hgb urine dipstick NEGATIVE NEGATIVE   Bilirubin Urine NEGATIVE NEGATIVE   Ketones, ur NEGATIVE NEGATIVE mg/dL   Protein, ur NEGATIVE NEGATIVE mg/dL   Nitrite NEGATIVE NEGATIVE   Leukocytes,Ua TRACE (A) NEGATIVE   RBC / HPF 0-5 0 - 5 RBC/hpf   WBC, UA 0-5 0 - 5 WBC/hpf   Bacteria, UA RARE (A) NONE SEEN   Squamous Epithelial / HPF 0-5 0 - 5 /HPF   Mucus PRESENT     Comment: Performed at [facility], [address], [place], [place] [address]  hCG, serum, qualitative     Status: None   Collection Time: 06/20/23  9:54 PM  Result Value Ref Range   Preg, Serum NEGATIVE NEGATIVE    Comment:        THE SENSITIVITY OF THIS METHODOLOGY IS >10 mIU/mL. Performed at [facility], [address], [place], [place] [address]   CBC with Differential/Platelet     Status: None   Collection Time: 06/21/23  3:10 AM  Result Value Ref Range   WBC 8.5 4.0 - 10.5 K/uL   RBC 4.77 3.87 - 5.11 MIL/uL   Hemoglobin 12.9 12.0 - 15.0 g/dL   HCT 96.2 95.2 - 84.1 %   MCV 84.7 80.0 - 100.0 fL   MCH 27.0 26.0 - 34.0 pg   MCHC 31.9 30.0 - 36.0 g/dL   RDW 32.4 40.1 - 02.7 %   Platelets 251 150 - 400 K/uL   nRBC 0.0 0.0 - 0.2 %   Neutrophils Relative % 67 %   Neutro Abs 5.7 1.7 - 7.7 K/uL   Lymphocytes Relative 25 %   Lymphs Abs 2.1 0.7 - 4.0 K/uL   Monocytes Relative 6 %   Monocytes Absolute 0.5 0.1 - 1.0 K/uL   Eosinophils Relative 1 %   Eosinophils Absolute 0.1 0.0 - 0.5 K/uL   Basophils Relative 0 %   Basophils Absolute 0.0 0.0 - 0.1 K/uL   Immature Granulocytes 1 %   Abs Immature Granulocytes 0.04 0.00 - 0.07 K/uL    Comment: Performed at [facility], [address] [place], [place] [address]  Comprehensive metabolic panel      Status: Abnormal   Collection Time: 06/21/23  3:10 AM  Result Value Ref Range   Sodium 136 135 - 145 mmol/L   Potassium 3.3 (L) 3.5 - 5.1 mmol/L   Chloride 99 98 - 111 mmol/L   CO2 21 (L) 22 - 32 mmol/L   Glucose, Bld 102 (H) 70 - 99 mg/dL    Comment: Glucose reference range applies only to samples taken after fasting for at least 8 hours.   BUN 6 6 - 20 mg/dL   Creatinine, Ser 4.40 0.44 - 1.00 mg/dL   Calcium 8.6 (L) 8.9 - 10.3 mg/dL   Total Protein 6.4 (L) 6.5 - 8.1 g/dL   Albumin 3.0 (L) 3.5 - 5.0 g/dL   AST 347 (H) 15 - 41 U/L   ALT 338 (H) 0 - 44 U/L   Alkaline Phosphatase 243 (H) 38 - 126 U/L   Total Bilirubin 4.1 (H) 0.3 - 1.2 mg/dL  GFR, Estimated >60 >60 mL/min    Comment: (NOTE) Calculated using the CKD-EPI Creatinine Equation (2021)    Anion gap 16 (H) 5 - 15    Comment: Performed at [facility], [address], [place], [place] [address]  Magnesium     Status: None   Collection Time: 06/21/23  3:10 AM  Result Value Ref Range   Magnesium 2.0 1.7 - 2.4 mg/dL    Comment: Performed at [facility], [address] [place], [place] [address]  Bilirubin, direct     Status: Abnormal   Collection Time: 06/21/23  3:10 AM  Result Value Ref Range   Bilirubin, Direct 2.4 (H) 0.0 - 0.2 mg/dL    Comment: Performed at [facility], [address], [place], [place] [address]  Gamma GT     Status: Abnormal   Collection Time: 06/21/23  3:10 AM  Result Value Ref Range   GGT 587 (H) 7 - 50 U/L    Comment: Performed at [facility], [address] [place], [place] [address]  Triglycerides     Status: None   Collection Time: 06/21/23  3:38 AM  Result Value Ref Range   Triglycerides 54 <150 mg/dL    Comment: Performed at [facility], [address] [place], [place] [address]  Rapid urine drug screen (hospital performed)     Status: Abnormal   Collection Time: 06/21/23  7:05 AM  Result Value Ref Range   Opiates POSITIVE (A) NONE DETECTED    Cocaine NONE DETECTED NONE DETECTED   Benzodiazepines NONE DETECTED NONE DETECTED   Amphetamines NONE DETECTED NONE DETECTED   Tetrahydrocannabinol POSITIVE (A) NONE DETECTED   Barbiturates NONE DETECTED NONE DETECTED    Comment: (NOTE) DRUG SCREEN FOR MEDICAL PURPOSES ONLY.  IF CONFIRMATION IS NEEDED FOR ANY PURPOSE, NOTIFY LAB WITHIN 5 DAYS.  LOWEST DETECTABLE LIMITS FOR URINE DRUG SCREEN Drug Class                     Cutoff (ng/mL) Amphetamine and metabolites    1000 Barbiturate and metabolites    200 Benzodiazepine                 200 Opiates and metabolites        300 Cocaine and metabolites        300 THC                            50 Performed at [facility], [address], [place], [place] [address]   Lipase, blood     Status: Abnormal   Collection Time: 06/21/23  8:17 AM  Result Value Ref Range   Lipase 391 (H) 11 - 51 U/L    Comment: Performed at [facility], [address] [place], [place] [address]  Ethanol     Status: None   Collection Time: 06/21/23  8:17 AM  Result Value Ref Range   Alcohol, Ethyl (B) <10 <10 mg/dL    Comment: (NOTE) Lowest detectable limit for serum alcohol is 10 mg/dL.  For medical purposes only. Performed at [facility], [address], [place], [place] [address]   Protime-INR     Status: None   Collection Time: 06/21/23  8:17 AM  Result Value Ref Range   Prothrombin Time 13.6 11.4 - 15.2 seconds   INR 1.0 0.8 - 1.2  Comment: (NOTE) INR goal varies based on device and disease states. Performed at [facility], [address] [place], [place] [address]   CBC     Status: None   Collection Time: 06/22/23 12:00 AM  Result Value Ref Range   WBC 9.2 4.0 - 10.5 K/uL   RBC 4.88 3.87 - 5.11 MIL/uL   Hemoglobin 13.1 12.0 - 15.0 g/dL   HCT 60.4 54.0 - 98.1 %   MCV 84.2 80.0 - 100.0 fL   MCH 26.8 26.0 - 34.0 pg   MCHC 31.9 30.0 - 36.0 g/dL   RDW 19.1 47.8 - 29.5 %   Platelets 258 150 - 400 K/uL   nRBC  0.0 0.0 - 0.2 %    Comment: Performed at [facility], [address], [place], [place] [address]  Comprehensive metabolic panel     Status: Abnormal   Collection Time: 06/22/23 12:00 AM  Result Value Ref Range   Sodium 135 135 - 145 mmol/L   Potassium 3.4 (L) 3.5 - 5.1 mmol/L   Chloride 101 98 - 111 mmol/L   CO2 24 22 - 32 mmol/L   Glucose, Bld 79 70 - 99 mg/dL    Comment: Glucose reference range applies only to samples taken after fasting for at least 8 hours.   BUN 7 6 - 20 mg/dL   Creatinine, Ser 8.65 0.44 - 1.00 mg/dL   Calcium 8.6 (L) 8.9 - 10.3 mg/dL   Total Protein 6.9 6.5 - 8.1 g/dL   Albumin 3.2 (L) 3.5 - 5.0 g/dL   AST 79 (H) 15 - 41 U/L   ALT 237 (H) 0 - 44 U/L   Alkaline Phosphatase 231 (H) 38 - 126 U/L   Total Bilirubin 1.4 (H) 0.3 - 1.2 mg/dL   GFR, Estimated >78 >46 mL/min    Comment: (NOTE) Calculated using the CKD-EPI Creatinine Equation (2021)    Anion gap 10 5 - 15    Comment: Performed at [facility], [address], [place], [place] [address]  Magnesium     Status: None   Collection Time: 06/22/23 12:00 AM  Result Value Ref Range   Magnesium 2.0 1.7 - 2.4 mg/dL    Comment: Performed at [facility], [address], [place], [place] [address]  Phosphorus     Status: None   Collection Time: 06/22/23 12:00 AM  Result Value Ref Range   Phosphorus 3.4 2.5 - 4.6 mg/dL    Comment: Performed at [facility], [address] [place], [place] [address]    MR ABDOMEN MRCP W WO CONTAST  Result Date: 06/21/2023 CLINICAL DATA:  Epigastric abdominal pain and vomiting for 3 days. Tenderness to palpation. EXAM: MRI ABDOMEN WITHOUT AND WITH CONTRAST (INCLUDING MRCP) TECHNIQUE: Multiplanar multisequence MR imaging of the abdomen was performed both before and after the administration of intravenous contrast. Heavily T2-weighted images of the biliary and pancreatic ducts were obtained, and three-dimensional MRCP images were rendered by post  processing. CONTRAST:  9mL GADAVIST GADOBUTROL 1 MMOL/ML IV SOLN COMPARISON:  None Available. FINDINGS: Lower chest: No acute findings. Hepatobiliary: No hepatic masses identified. Multiple tiny gallstones are noted. Gallbladder is nondistended, however mild diffuse gallbladder wall thickening is seen, without pericholecystic inflammatory changes. This is nonspecific and may be secondary to pancreatitis. Image degradation by motion artifact noted. Mild diffuse biliary ductal dilatation is seen, with common bile duct measuring up to 8 mm in diameter. A meniscus sign is  seen at the junction of the distal common bile duct and ampulla, and an impacted distal common bile duct calculus cannot be excluded. No other biliary calculi identified. Pancreas: Diffuse pancreatic edema is seen, consistent with acute pancreatitis. No evidence of pancreatic mass or necrosis. No evidence of pancreatic ductal dilatation or pancreas divisum. No evidence of peripancreatic fluid collections. Spleen:  Within normal limits in size and appearance. Adrenals/Urinary Tract: No suspicious masses identified. No evidence of hydronephrosis. Stomach/Bowel: Unremarkable. Vascular/Lymphatic: No pathologically enlarged lymph nodes identified. No acute vascular findings. Other:  None. Musculoskeletal:  No suspicious bone lesions identified. IMPRESSION: Image degradation by motion artifact noted. Mild acute pancreatitis. No evidence of pancreatic necrosis or pseudocysts. Cholelithiasis. Mild diffuse gallbladder wall thickening is nonspecific, and may be secondary to pancreatitis. Consider nuclear medicine HIDA scan if clinically warranted to exclude cholecystitis. Mild diffuse biliary ductal dilatation, with possible impacted distal common bile duct calculus. Recommend correlation with liver function tests, and consider ERCP. Electronically Signed   By: Danae Orleans M.D.   On: 06/21/2023 11:09   MR 3D Recon At Scanner  Result Date:  06/21/2023 CLINICAL DATA:  Epigastric abdominal pain and vomiting for 3 days. Tenderness to palpation. EXAM: MRI ABDOMEN WITHOUT AND WITH CONTRAST (INCLUDING MRCP) TECHNIQUE: Multiplanar multisequence MR imaging of the abdomen was performed both before and after the administration of intravenous contrast. Heavily T2-weighted images of the biliary and pancreatic ducts were obtained, and three-dimensional MRCP images were rendered by post processing. CONTRAST:  9mL GADAVIST GADOBUTROL 1 MMOL/ML IV SOLN COMPARISON:  None Available. FINDINGS: Lower chest: No acute findings. Hepatobiliary: No hepatic masses identified. Multiple tiny gallstones are noted. Gallbladder is nondistended, however mild diffuse gallbladder wall thickening is seen, without pericholecystic inflammatory changes. This is nonspecific and may be secondary to pancreatitis. Image degradation by motion artifact noted. Mild diffuse biliary ductal dilatation is seen, with common bile duct measuring up to 8 mm in diameter. A meniscus sign is seen at the junction of the distal common bile duct and ampulla, and an impacted distal common bile duct calculus cannot be excluded. No other biliary calculi identified. Pancreas: Diffuse pancreatic edema is seen, consistent with acute pancreatitis. No evidence of pancreatic mass or necrosis. No evidence of pancreatic ductal dilatation or pancreas divisum. No evidence of peripancreatic fluid collections. Spleen:  Within normal limits in size and appearance. Adrenals/Urinary Tract: No suspicious masses identified. No evidence of hydronephrosis. Stomach/Bowel: Unremarkable. Vascular/Lymphatic: No pathologically enlarged lymph nodes identified. No acute vascular findings. Other:  None. Musculoskeletal:  No suspicious bone lesions identified. IMPRESSION: Image degradation by motion artifact noted. Mild acute pancreatitis. No evidence of pancreatic necrosis or pseudocysts. Cholelithiasis. Mild diffuse gallbladder wall  thickening is nonspecific, and may be secondary to pancreatitis. Consider nuclear medicine HIDA scan if clinically warranted to exclude cholecystitis. Mild diffuse biliary ductal dilatation, with possible impacted distal common bile duct calculus. Recommend correlation with liver function tests, and consider ERCP. Electronically Signed   By: Danae Orleans M.D.   On: 06/21/2023 11:09   US Abdomen Limited RUQ (LIVER/GB)  Result Date: 06/20/2023 CLINICAL DATA:  Right upper quadrant abdominal pain. EXAM: ULTRASOUND ABDOMEN LIMITED RIGHT UPPER QUADRANT COMPARISON:  Ultrasound dated 06/07/2019. FINDINGS: Gallbladder: Multiple gallstones. No gallbladder wall thickening or pericholecystic fluid. Negative sonographic Murphy's sign. Common bile duct: Diameter: 7 mm Liver: There is diffuse increased liver echogenicity most commonly seen in the setting of fatty infiltration. Superimposed inflammation or fibrosis is not excluded. Clinical correlation is recommended. A 1.9 cm echogenic structure  in the porta hepatis likely represents periportal fat or tissue interface. Portal vein is patent on color Doppler imaging with normal direction of blood flow towards the liver. Other: None. IMPRESSION: 1. Cholelithiasis without sonographic evidence of acute cholecystitis. 2. Fatty liver. Electronically Signed   By: Elgie Collard M.D.   On: 06/20/2023 22:21    Review of Systems  Constitutional:  Negative for fever.  Gastrointestinal:  Positive for abdominal distention, abdominal pain, diarrhea, nausea and vomiting. Negative for constipation.  All other systems reviewed and are negative.  Blood pressure 115/76, pulse 65, temperature 98.7 F (37.1 C), temperature source Oral, resp. rate 16, height 5' (1.524 m), weight (!) 143.4 kg, SpO2 100%. Physical Exam Vitals reviewed.  Constitutional:      Appearance: She is well-developed.  HENT:     Head: Normocephalic and atraumatic.     Mouth/Throat:     Mouth: Mucous membranes  are moist.  Eyes:     General: No scleral icterus. Cardiovascular:     Rate and Rhythm: Normal rate and regular rhythm.  Pulmonary:     Effort: Pulmonary effort is normal.     Breath sounds: No wheezing.  Abdominal:     General: There is no distension.     Palpations: Abdomen is soft.     Tenderness: There is abdominal tenderness (mild) in the epigastric area.     Hernia: No hernia is present.  Skin:    General: Skin is warm and dry.     Capillary Refill: Capillary refill takes less than 2 seconds.  Neurological:     General: No focal deficit present.     Mental Status: She is alert.  Psychiatric:        Mood and Affect: Mood normal.        Behavior: Behavior normal.     Assessment/Plan: GSP, poss choledocholithiasis -appears to have passed stone, numbers and clinically better -if continues to do well will do lap chole with cholangiogram tomorrow. Will also use icg dye.   -I discussed the procedure in detail.  We discussed the risks and benefits of a laparoscopic cholecystectomy and possible cholangiogram including, but not limited to bleeding, infection, injury to surrounding structures such as the intestine or liver, bile leak, retained gallstones, need to convert to an open procedure, prolonged diarrhea, blood clots such as  DVT, common bile duct injury, anesthesia risks, and possible need for additional procedures.  The likelihood of improvement in symptoms and return to the patient's normal status is good. We discussed the typical post-operative recovery course.   I personally reviewed images of US showing gallstones as well as mrcp. I personally discussed this patient's care with medicine about plan. I reviewed recent lab values, vitals, and notes.    Emelia Loron 06/22/2023, 3:44 PM

## 2023-06-22 NOTE — Progress Notes (Signed)
Progress Note  Primary GI: Unassigned (initial consult Dr. Tomasa Rand)  LOS: 2 days   Chief Complaint: Gallstone pancreatitis with CBD stone   Subjective   Mother on phone during evaluation. Patient states her nausea and vomiting have improved as well as her abdominal pain. Pain is still present, though controlled. Tolerating diet without difficulty and would like to advance if possible.   Objective   Vital signs in last 24 hours: Temp:  [98.1 F (36.7 C)-98.6 F (37 C)] 98.6 F (37 C) (07/26 0722) Pulse Rate:  [67-74] 70 (07/26 0722) Resp:  [16-19] 16 (07/26 0722) BP: (100-132)/(44-88) 122/65 (07/26 0722) SpO2:  [68 %-100 %] 100 % (07/26 0722) Weight:  [143.4 kg] 143.4 kg (07/26 0406) Last BM Date : 06/19/23 Last BM recorded by nurses in past 5 days Stool Type: Type 7 (Liquid consistency with no solid pieces) (06/21/2023  8:00 PM)  General:   female in no acute distress Heart:  Regular rate and rhythm; no murmurs Pulm: Clear anteriorly; no wheezing Abdomen: soft, nondistended, normal bowel sounds in all quadrants. Mild epigastric tenderness.. No organomegaly appreciated. Extremities:  No edema Neurologic:  Alert and  oriented x4;  No focal deficits.  Psych:  Cooperative. Normal mood and affect.  Intake/Output from previous day: 07/25 0701 - 07/26 0700 In: 240 [P.O.:240] Out: -  Intake/Output this shift: No intake/output data recorded.  Studies/Results: MR ABDOMEN MRCP W WO CONTAST  Result Date: 06/21/2023 CLINICAL DATA:  Epigastric abdominal pain and vomiting for 3 days. Tenderness to palpation. EXAM: MRI ABDOMEN WITHOUT AND WITH CONTRAST (INCLUDING MRCP) TECHNIQUE: Multiplanar multisequence MR imaging of the abdomen was performed both before and after the administration of intravenous contrast. Heavily T2-weighted images of the biliary and pancreatic ducts were obtained, and three-dimensional MRCP images were rendered by post processing. CONTRAST:  9mL GADAVIST  GADOBUTROL 1 MMOL/ML IV SOLN COMPARISON:  None Available. FINDINGS: Lower chest: No acute findings. Hepatobiliary: No hepatic masses identified. Multiple tiny gallstones are noted. Gallbladder is nondistended, however mild diffuse gallbladder wall thickening is seen, without pericholecystic inflammatory changes. This is nonspecific and may be secondary to pancreatitis. Image degradation by motion artifact noted. Mild diffuse biliary ductal dilatation is seen, with common bile duct measuring up to 8 mm in diameter. A meniscus sign is seen at the junction of the distal common bile duct and ampulla, and an impacted distal common bile duct calculus cannot be excluded. No other biliary calculi identified. Pancreas: Diffuse pancreatic edema is seen, consistent with acute pancreatitis. No evidence of pancreatic mass or necrosis. No evidence of pancreatic ductal dilatation or pancreas divisum. No evidence of peripancreatic fluid collections. Spleen:  Within normal limits in size and appearance. Adrenals/Urinary Tract: No suspicious masses identified. No evidence of hydronephrosis. Stomach/Bowel: Unremarkable. Vascular/Lymphatic: No pathologically enlarged lymph nodes identified. No acute vascular findings. Other:  None. Musculoskeletal:  No suspicious bone lesions identified. IMPRESSION: Image degradation by motion artifact noted. Mild acute pancreatitis. No evidence of pancreatic necrosis or pseudocysts. Cholelithiasis. Mild diffuse gallbladder wall thickening is nonspecific, and may be secondary to pancreatitis. Consider nuclear medicine HIDA scan if clinically warranted to exclude cholecystitis. Mild diffuse biliary ductal dilatation, with possible impacted distal common bile duct calculus. Recommend correlation with liver function tests, and consider ERCP. Electronically Signed   By: Danae Orleans M.D.   On: 06/21/2023 11:09   MR 3D Recon At Scanner  Result Date: 06/21/2023 CLINICAL DATA:  Epigastric abdominal pain  and vomiting for 3 days. Tenderness to palpation.  EXAM: MRI ABDOMEN WITHOUT AND WITH CONTRAST (INCLUDING MRCP) TECHNIQUE: Multiplanar multisequence MR imaging of the abdomen was performed both before and after the administration of intravenous contrast. Heavily T2-weighted images of the biliary and pancreatic ducts were obtained, and three-dimensional MRCP images were rendered by post processing. CONTRAST:  9mL GADAVIST GADOBUTROL 1 MMOL/ML IV SOLN COMPARISON:  None Available. FINDINGS: Lower chest: No acute findings. Hepatobiliary: No hepatic masses identified. Multiple tiny gallstones are noted. Gallbladder is nondistended, however mild diffuse gallbladder wall thickening is seen, without pericholecystic inflammatory changes. This is nonspecific and may be secondary to pancreatitis. Image degradation by motion artifact noted. Mild diffuse biliary ductal dilatation is seen, with common bile duct measuring up to 8 mm in diameter. A meniscus sign is seen at the junction of the distal common bile duct and ampulla, and an impacted distal common bile duct calculus cannot be excluded. No other biliary calculi identified. Pancreas: Diffuse pancreatic edema is seen, consistent with acute pancreatitis. No evidence of pancreatic mass or necrosis. No evidence of pancreatic ductal dilatation or pancreas divisum. No evidence of peripancreatic fluid collections. Spleen:  Within normal limits in size and appearance. Adrenals/Urinary Tract: No suspicious masses identified. No evidence of hydronephrosis. Stomach/Bowel: Unremarkable. Vascular/Lymphatic: No pathologically enlarged lymph nodes identified. No acute vascular findings. Other:  None. Musculoskeletal:  No suspicious bone lesions identified. IMPRESSION: Image degradation by motion artifact noted. Mild acute pancreatitis. No evidence of pancreatic necrosis or pseudocysts. Cholelithiasis. Mild diffuse gallbladder wall thickening is nonspecific, and may be secondary to  pancreatitis. Consider nuclear medicine HIDA scan if clinically warranted to exclude cholecystitis. Mild diffuse biliary ductal dilatation, with possible impacted distal common bile duct calculus. Recommend correlation with liver function tests, and consider ERCP. Electronically Signed   By: Danae Orleans M.D.   On: 06/21/2023 11:09   US Abdomen Limited RUQ (LIVER/GB)  Result Date: 06/20/2023 CLINICAL DATA:  Right upper quadrant abdominal pain. EXAM: ULTRASOUND ABDOMEN LIMITED RIGHT UPPER QUADRANT COMPARISON:  Ultrasound dated 06/07/2019. FINDINGS: Gallbladder: Multiple gallstones. No gallbladder wall thickening or pericholecystic fluid. Negative sonographic Murphy's sign. Common bile duct: Diameter: 7 mm Liver: There is diffuse increased liver echogenicity most commonly seen in the setting of fatty infiltration. Superimposed inflammation or fibrosis is not excluded. Clinical correlation is recommended. A 1.9 cm echogenic structure in the porta hepatis likely represents periportal fat or tissue interface. Portal vein is patent on color Doppler imaging with normal direction of blood flow towards the liver. Other: None. IMPRESSION: 1. Cholelithiasis without sonographic evidence of acute cholecystitis. 2. Fatty liver. Electronically Signed   By: Elgie Collard M.D.   On: 06/20/2023 22:21    Lab Results: Recent Labs    06/20/23 1819 06/21/23 0310 06/22/23 0000  WBC 12.2* 8.5 9.2  HGB 14.7 12.9 13.1  HCT 45.2 40.4 41.1  PLT 326 251 258   BMET Recent Labs    06/20/23 1819 06/21/23 0310 06/22/23 0000  NA 137 136 135  K 3.4* 3.3* 3.4*  CL 103 99 101  CO2 22 21* 24  GLUCOSE 132* 102* 79  BUN 5* 6 7  CREATININE 0.80 0.68 0.69  CALCIUM 8.9 8.6* 8.6*   LFT Recent Labs    06/21/23 0310 06/22/23 0000  PROT 6.4* 6.9  ALBUMIN 3.0* 3.2*  AST 210* 79*  ALT 338* 237*  ALKPHOS 243* 231*  BILITOT 4.1* 1.4*  BILIDIR 2.4*  --    PT/INR Recent Labs    06/21/23 0817  LABPROT 13.6  INR 1.0  Scheduled Meds: Continuous Infusions:    Patient profile:   32 year old female with history of hypertension presented with nausea, vomiting, epigastric pain. MRCP shows gallstone pancreatitis with possible impacted CBD stone at distal CBD and ampulla (CBD 8mm). Obstructive LFTs. Initial lipase 1736, normal triglycerides, no leukocytosis. No previous episodes of pancreatitis    Impression:   Acute pancreatitis likely be secondary to gallstones With double lithiasis No leukocytosis AST 79/ALT 237/ Alk phos 231, improving T. Bili 1.4 (improved from 4.1) GGT 587 Triglycerides 54 Initial lipase 1763 Improvement in LFTs and clinical symptoms. Suspect with continued improving LFTs, hopefully the questionable impacted CBD stone has passed and maybe patient may not need ERCP. However, if LFTs happen to become elevated again a nonurgent ERCP may be indicated.    Plan:   -Non urgent consult to general surgery for evaluation of cholecystectomy -continue IVF and pain control - Trend CBC and CMP - Low suspicion for cholangitis without fevers or leukocytosis. May not need ERCP with improving LFTs, will continue to trend. Defer decision of ERCP to Dr. Russella Dar and Dr. Tomasa Rand. - Can advance to full liquids. If tolerating, then can advance to soft diet.  Alexandra Gray Leanna Sato  06/22/2023, 9:49 AM

## 2023-06-23 ENCOUNTER — Inpatient Hospital Stay (HOSPITAL_COMMUNITY): Payer: 59 | Admitting: Certified Registered Nurse Anesthetist

## 2023-06-23 ENCOUNTER — Encounter (HOSPITAL_COMMUNITY): Payer: Self-pay | Admitting: Internal Medicine

## 2023-06-23 ENCOUNTER — Encounter (HOSPITAL_COMMUNITY)
Admission: EM | Disposition: A | Discharge status: Home / Self Care | Payer: Self-pay | Source: Home / Self Care | Attending: Family Medicine

## 2023-06-23 ENCOUNTER — Inpatient Hospital Stay (HOSPITAL_COMMUNITY): Payer: 59

## 2023-06-23 DIAGNOSIS — I10 Essential (primary) hypertension: Secondary | ICD-10-CM | POA: Diagnosis not present

## 2023-06-23 DIAGNOSIS — K8042 Calculus of bile duct with acute cholecystitis without obstruction: Secondary | ICD-10-CM | POA: Diagnosis not present

## 2023-06-23 DIAGNOSIS — K810 Acute cholecystitis: Secondary | ICD-10-CM | POA: Diagnosis not present

## 2023-06-23 DIAGNOSIS — F17290 Nicotine dependence, other tobacco product, uncomplicated: Secondary | ICD-10-CM

## 2023-06-23 DIAGNOSIS — K851 Biliary acute pancreatitis without necrosis or infection: Secondary | ICD-10-CM | POA: Diagnosis not present

## 2023-06-23 DIAGNOSIS — J449 Chronic obstructive pulmonary disease, unspecified: Secondary | ICD-10-CM | POA: Diagnosis not present

## 2023-06-23 HISTORY — PX: CHOLECYSTECTOMY: SHX55

## 2023-06-23 SURGERY — LAPAROSCOPIC CHOLECYSTECTOMY WITH INTRAOPERATIVE CHOLANGIOGRAM
Anesthesia: General | Site: Abdomen

## 2023-06-23 MED ORDER — FENTANYL CITRATE (PF) 250 MCG/5ML IJ SOLN
INTRAMUSCULAR | Status: AC
  Filled 2023-06-23: qty 5

## 2023-06-23 MED ORDER — PHENYLEPHRINE HCL-NACL 20-0.9 MG/250ML-% IV SOLN
INTRAVENOUS | Status: DC | PRN
  Administered 2023-06-23: 25 ug/min via INTRAVENOUS

## 2023-06-23 MED ORDER — ROCURONIUM BROMIDE 10 MG/ML (PF) SYRINGE
PREFILLED_SYRINGE | INTRAVENOUS | Status: DC | PRN
  Administered 2023-06-23: 50 mg via INTRAVENOUS

## 2023-06-23 MED ORDER — BUPIVACAINE-EPINEPHRINE (PF) 0.5% -1:200000 IJ SOLN
INTRAMUSCULAR | Status: DC | PRN
  Administered 2023-06-23 (×2): 20 mL

## 2023-06-23 MED ORDER — PROPOFOL 10 MG/ML IV BOLUS
INTRAVENOUS | Status: DC | PRN
  Administered 2023-06-23: 200 mg via INTRAVENOUS

## 2023-06-23 MED ORDER — OXYCODONE HCL 5 MG PO TABS
5.0000 mg | ORAL_TABLET | Freq: Once | ORAL | Status: AC | PRN
  Administered 2023-06-23: 5 mg via ORAL

## 2023-06-23 MED ORDER — PROPOFOL 500 MG/50ML IV EMUL
INTRAVENOUS | Status: DC | PRN
  Administered 2023-06-23: 50 ug/kg/min via INTRAVENOUS

## 2023-06-23 MED ORDER — FENTANYL CITRATE (PF) 100 MCG/2ML IJ SOLN: 100.0000 ug | Freq: Once | INTRAMUSCULAR | Status: AC

## 2023-06-23 MED ORDER — IOHEXOL 300 MG/ML  SOLN
INTRAMUSCULAR | Status: DC | PRN
  Administered 2023-06-23: 5 mL

## 2023-06-23 MED ORDER — OXYCODONE HCL 5 MG PO TABS
ORAL_TABLET | ORAL | Status: AC
  Filled 2023-06-23: qty 1

## 2023-06-23 MED ORDER — MIDAZOLAM HCL 2 MG/2ML IJ SOLN: 2.0000 mg | Freq: Once | INTRAMUSCULAR | Status: AC

## 2023-06-23 MED ORDER — ESMOLOL HCL 100 MG/10ML IV SOLN
INTRAVENOUS | Status: DC | PRN
Start: 2023-06-23 — End: 2023-06-23
  Administered 2023-06-23: 50 mg via INTRAVENOUS

## 2023-06-23 MED ORDER — MIDAZOLAM HCL 2 MG/2ML IJ SOLN: 0.5000 mg | Freq: Once | INTRAMUSCULAR | Status: DC | PRN

## 2023-06-23 MED ORDER — LACTATED RINGERS IV SOLN: INTRAVENOUS | Status: DC

## 2023-06-23 MED ORDER — MIDAZOLAM HCL 2 MG/2ML IJ SOLN
INTRAMUSCULAR | Status: AC
  Administered 2023-06-23: 2 mg via INTRAVENOUS
  Filled 2023-06-23: qty 2

## 2023-06-23 MED ORDER — SODIUM CHLORIDE 0.9 % IR SOLN
Status: DC | PRN
  Administered 2023-06-23: 1000 mL

## 2023-06-23 MED ORDER — BUPIVACAINE-EPINEPHRINE 0.25% -1:200000 IJ SOLN
INTRAMUSCULAR | Status: DC | PRN
  Administered 2023-06-23: 30 mL

## 2023-06-23 MED ORDER — CHLORHEXIDINE GLUCONATE 0.12 % MT SOLN: 15.0000 mL | Freq: Once | OROMUCOSAL | Status: AC

## 2023-06-23 MED ORDER — DEXAMETHASONE SODIUM PHOSPHATE 10 MG/ML IJ SOLN
INTRAMUSCULAR | Status: DC | PRN
  Administered 2023-06-23: 10 mg via INTRAVENOUS

## 2023-06-23 MED ORDER — MIDAZOLAM HCL 2 MG/2ML IJ SOLN
INTRAMUSCULAR | Status: DC | PRN
  Administered 2023-06-23: 1 mg via INTRAVENOUS

## 2023-06-23 MED ORDER — SUCCINYLCHOLINE CHLORIDE 200 MG/10ML IV SOSY
PREFILLED_SYRINGE | INTRAVENOUS | Status: DC | PRN
  Administered 2023-06-23: 200 mg via INTRAVENOUS

## 2023-06-23 MED ORDER — CHLORHEXIDINE GLUCONATE 0.12 % MT SOLN
OROMUCOSAL | Status: AC
  Administered 2023-06-23: 15 mL via OROMUCOSAL
  Filled 2023-06-23: qty 15

## 2023-06-23 MED ORDER — LACTATED RINGERS IV SOLN: INTRAVENOUS | Status: DC | PRN

## 2023-06-23 MED ORDER — BUPIVACAINE-EPINEPHRINE (PF) 0.25% -1:200000 IJ SOLN
INTRAMUSCULAR | Status: AC
  Filled 2023-06-23: qty 30

## 2023-06-23 MED ORDER — MEPERIDINE HCL 25 MG/ML IJ SOLN: 6.2500 mg | INTRAMUSCULAR | Status: DC | PRN

## 2023-06-23 MED ORDER — OXYCODONE HCL 5 MG/5ML PO SOLN: 5.0000 mg | Freq: Once | ORAL | Status: AC | PRN

## 2023-06-23 MED ORDER — HYDROMORPHONE HCL 1 MG/ML IJ SOLN
0.2500 mg | INTRAMUSCULAR | Status: DC | PRN
  Administered 2023-06-23: 0.5 mg via INTRAVENOUS
  Administered 2023-06-23 (×2): 0.25 mg via INTRAVENOUS

## 2023-06-23 MED ORDER — SUGAMMADEX SODIUM 200 MG/2ML IV SOLN
INTRAVENOUS | Status: DC | PRN
  Administered 2023-06-23: 286 mg via INTRAVENOUS

## 2023-06-23 MED ORDER — ORAL CARE MOUTH RINSE: 15.0000 mL | Freq: Once | OROMUCOSAL | Status: AC

## 2023-06-23 MED ORDER — 0.9 % SODIUM CHLORIDE (POUR BTL) OPTIME
TOPICAL | Status: DC | PRN
  Administered 2023-06-23: 1000 mL

## 2023-06-23 MED ORDER — PHENYLEPHRINE 80 MCG/ML (10ML) SYRINGE FOR IV PUSH (FOR BLOOD PRESSURE SUPPORT)
PREFILLED_SYRINGE | INTRAVENOUS | Status: DC | PRN
  Administered 2023-06-23 (×2): 160 ug via INTRAVENOUS

## 2023-06-23 MED ORDER — KETOROLAC TROMETHAMINE 30 MG/ML IJ SOLN
INTRAMUSCULAR | Status: DC | PRN
  Administered 2023-06-23: 30 mg via INTRAVENOUS

## 2023-06-23 MED ORDER — FENTANYL CITRATE (PF) 250 MCG/5ML IJ SOLN
INTRAMUSCULAR | Status: DC | PRN
  Administered 2023-06-23 (×2): 25 ug via INTRAVENOUS
  Administered 2023-06-23: 150 ug via INTRAVENOUS

## 2023-06-23 MED ORDER — ONDANSETRON HCL 4 MG/2ML IJ SOLN
INTRAMUSCULAR | Status: DC | PRN
  Administered 2023-06-23: 4 mg via INTRAVENOUS

## 2023-06-23 MED ORDER — FENTANYL CITRATE (PF) 100 MCG/2ML IJ SOLN
INTRAMUSCULAR | Status: AC
  Administered 2023-06-23: 100 ug via INTRAVENOUS
  Filled 2023-06-23: qty 2

## 2023-06-23 MED ORDER — LIDOCAINE 2% (20 MG/ML) 5 ML SYRINGE
INTRAMUSCULAR | Status: DC | PRN
  Administered 2023-06-23: 60 mg via INTRAVENOUS

## 2023-06-23 MED ORDER — HYDROMORPHONE HCL 1 MG/ML IJ SOLN
INTRAMUSCULAR | Status: AC
  Filled 2023-06-23: qty 1

## 2023-06-23 MED ORDER — CEFAZOLIN SODIUM-DEXTROSE 2-3 GM-%(50ML) IV SOLR
INTRAVENOUS | Status: DC | PRN
  Administered 2023-06-23: 3 g via INTRAVENOUS

## 2023-06-23 MED ORDER — PROMETHAZINE HCL 25 MG/ML IJ SOLN: 6.2500 mg | INTRAMUSCULAR | Status: DC | PRN

## 2023-06-23 MED ORDER — MIDAZOLAM HCL 2 MG/2ML IJ SOLN
INTRAMUSCULAR | Status: AC
  Filled 2023-06-23: qty 2

## 2023-06-23 SURGICAL SUPPLY — 54 items
ADH SKN CLS APL DERMABOND .7 (GAUZE/BANDAGES/DRESSINGS) ×2
ADH SKN CLS LQ APL DERMABOND (GAUZE/BANDAGES/DRESSINGS) ×2
APL PRP STRL LF DISP 70% ISPRP (MISCELLANEOUS) ×2
APPLIER CLIP 5 13 M/L LIGAMAX5 (MISCELLANEOUS) ×2
APR CLP MED LRG 5 ANG JAW (MISCELLANEOUS) ×2
BAG COUNTER SPONGE SURGICOUNT (BAG) ×2 IMPLANT
BAG SPEC RTRVL 10 TROC 200 (ENDOMECHANICALS)
BAG SPNG CNTER NS LX DISP (BAG) ×2
BLADE CLIPPER SURG (BLADE) IMPLANT
CANISTER SUCT 3000ML PPV (MISCELLANEOUS) ×2 IMPLANT
CHLORAPREP W/TINT 26 (MISCELLANEOUS) ×2 IMPLANT
CLIP APPLIE 5 13 M/L LIGAMAX5 (MISCELLANEOUS) ×2 IMPLANT
CNTNR URN SCR LID CUP LEK RST (MISCELLANEOUS) ×1 IMPLANT
CONT SPEC 4OZ STRL OR WHT (MISCELLANEOUS) ×2
COVER MAYO STAND STRL (DRAPES) ×1 IMPLANT
COVER SURGICAL LIGHT HANDLE (MISCELLANEOUS) ×2 IMPLANT
DERMABOND ADVANCED .7 DNX12 (GAUZE/BANDAGES/DRESSINGS) ×2 IMPLANT
DERMABOND ADVANCED .7 DNX6 (GAUZE/BANDAGES/DRESSINGS) ×1 IMPLANT
DRAPE C-ARM 42X120 X-RAY (DRAPES) ×1 IMPLANT
ELECT REM PT RETURN 9FT ADLT (ELECTROSURGICAL) ×2
ELECTRODE REM PT RTRN 9FT ADLT (ELECTROSURGICAL) ×2 IMPLANT
ENDOLOOP SUT PDS II 0 18 (SUTURE) ×1 IMPLANT
GLOVE BIO SURGEON STRL SZ7 (GLOVE) ×1 IMPLANT
GLOVE BIOGEL PI IND STRL 7.5 (GLOVE) ×1 IMPLANT
GOWN STRL REUS W/ TWL LRG LVL3 (GOWN DISPOSABLE) ×6 IMPLANT
GOWN STRL REUS W/TWL LRG LVL3 (GOWN DISPOSABLE) ×6
GRASPER SUT TROCAR 14GX15 (MISCELLANEOUS) ×2 IMPLANT
IRRIG SUCT STRYKERFLOW 2 WTIP (MISCELLANEOUS) ×2
IRRIGATION SUCT STRKRFLW 2 WTP (MISCELLANEOUS) ×2 IMPLANT
KIT BASIN OR (CUSTOM PROCEDURE TRAY) ×2 IMPLANT
KIT IMAGING PINPOINTPAQ (MISCELLANEOUS) IMPLANT
KIT TURNOVER KIT B (KITS) ×2 IMPLANT
MAT PREVALON FULL STRYKER (MISCELLANEOUS) ×1 IMPLANT
NS IRRIG 1000ML POUR BTL (IV SOLUTION) ×2 IMPLANT
PAD ARMBOARD 7.5X6 YLW CONV (MISCELLANEOUS) ×2 IMPLANT
POUCH RETRIEVAL ECOSAC 10 (ENDOMECHANICALS) ×1 IMPLANT
SCISSORS LAP 5X35 DISP (ENDOMECHANICALS) ×2 IMPLANT
SET CHOLANGIOGRAPH 5 50 .035 (SET/KITS/TRAYS/PACK) ×1 IMPLANT
SET TUBE SMOKE EVAC HIGH FLOW (TUBING) ×2 IMPLANT
SLEEVE Z-THREAD 5X100MM (TROCAR) ×4 IMPLANT
SPECIMEN JAR SMALL (MISCELLANEOUS) ×2 IMPLANT
STRIP CLOSURE SKIN 1/2X4 (GAUZE/BANDAGES/DRESSINGS) ×2 IMPLANT
SUT MNCRL AB 4-0 PS2 18 (SUTURE) ×2 IMPLANT
SUT VICRYL 0 UR6 27IN ABS (SUTURE) ×2 IMPLANT
SYS BAG RETRIEVAL 10MM (BASKET) ×2
SYSTEM BAG RETRIEVAL 10MM (BASKET) ×1 IMPLANT
TOWEL GREEN STERILE (TOWEL DISPOSABLE) ×2 IMPLANT
TOWEL GREEN STERILE FF (TOWEL DISPOSABLE) ×2 IMPLANT
TRAY LAPAROSCOPIC MC (CUSTOM PROCEDURE TRAY) ×2 IMPLANT
TROCAR BALLN 12MMX100 BLUNT (TROCAR) ×2 IMPLANT
TROCAR Z THREAD OPTICAL 12X100 (TROCAR) ×1 IMPLANT
TROCAR Z-THREAD OPTICAL 5X100M (TROCAR) ×2 IMPLANT
WARMER LAPAROSCOPE (MISCELLANEOUS) ×2 IMPLANT
WATER STERILE IRR 1000ML POUR (IV SOLUTION) ×2 IMPLANT

## 2023-06-23 NOTE — Anesthesia Procedure Notes (Signed)
Procedure Name: Intubation Date/Time: 06/23/2023 10:20 AM  Performed by: Darryl Nestle, CRNAPre-anesthesia Checklist: Patient identified, Emergency Drugs available, Suction available and Patient being monitored Patient Re-evaluated:Patient Re-evaluated prior to induction Oxygen Delivery Method: Circle system utilized Preoxygenation: Pre-oxygenation with 100% oxygen Induction Type: IV induction, Rapid sequence and Cricoid Pressure applied Laryngoscope Size: Mac and 3 Grade View: Grade II Tube type: Oral Tube size: 7.0 mm Number of attempts: 1 Airway Equipment and Method: Stylet and Oral airway Placement Confirmation: ETT inserted through vocal cords under direct vision, positive ETCO2 and breath sounds checked- equal and bilateral Secured at: 22 cm Tube secured with: Tape Dental Injury: Teeth and Oropharynx as per pre-operative assessment

## 2023-06-23 NOTE — Anesthesia Procedure Notes (Signed)
Anesthesia Regional Block: TAP block   Pre-Anesthetic Checklist: , timeout performed,  Correct Patient, Correct Site, Correct Laterality,  Correct Procedure, Correct Position, site marked,  Risks and benefits discussed,  Surgical consent,  Pre-op evaluation,  At surgeon's request and post-op pain management  Laterality: Left  Prep: chloraprep       Needles:  Injection technique: Single-shot  Needle Type: Echogenic Needle     Needle Length: 9cm  Needle Gauge: 21     Additional Needles:   Procedures:,,,, ultrasound used (permanent image in chart),,    Narrative:  Start time: 06/23/2023 9:32 AM End time: 06/23/2023 9:35 AM Injection made incrementally with aspirations every 5 mL.  Performed by: Personally  Anesthesiologist: Jairo Ben, MD  Additional Notes: Pt identified in Holding room.  Monitors applied. Working IV access confirmed. Timeout, Sterile prep L abdomen/flank.  #21ga ECHOgenic Arrow block needle into TAP with US guidance.  20cc 0.5% Bupivacaine 1:200k epi injected incrementally after negative test dose, good spread into TAP.  Patient asymptomatic, VSS, no heme aspirated, tolerated well.   Sandford Craze, MD

## 2023-06-23 NOTE — Anesthesia Procedure Notes (Signed)
Anesthesia Regional Block: TAP block   Pre-Anesthetic Checklist: , timeout performed,  Correct Patient, Correct Site, Correct Laterality,  Correct Procedure, Correct Position, site marked,  Risks and benefits discussed,  Surgical consent,  Pre-op evaluation,  At surgeon's request and post-op pain management  Laterality: Right  Prep: chloraprep       Needles:  Injection technique: Single-shot  Needle Type: Echogenic Needle     Needle Length: 9cm  Needle Gauge: 21     Additional Needles:   Procedures:,,,, ultrasound used (permanent image in chart),,    Narrative:  Start time: 06/23/2023 9:24 AM End time: 06/23/2023 9:31 AM Injection made incrementally with aspirations every 5 mL.  Performed by: Personally  Anesthesiologist: Jairo Ben, MD  Additional Notes: Pt identified in Holding room.  Monitors applied. Working IV access confirmed. Timeout, Sterile prep R abdomen/flank.  #21ga ECHOgenic Arrow block needle into TAP with US guidance.  20cc 0.5% Bupivacaine 1:200k epi injected incrementally after negative test dose, good spread into TAP.  Patient asymptomatic, VSS, no heme aspirated, tolerated well.   Sandford Craze, MD

## 2023-06-23 NOTE — Plan of Care (Signed)
  Problem: Education: Goal: Knowledge of General Education information will improve Description: Including pain rating scale, medication(s)/side effects and non-pharmacologic comfort measures Outcome: Progressing   Problem: Health Behavior/Discharge Planning: Goal: Ability to manage health-related needs will improve Outcome: Progressing   Problem: Clinical Measurements: Goal: Diagnostic test results will improve Outcome: Progressing Goal: Respiratory complications will improve Outcome: Progressing   Problem: Activity: Goal: Risk for activity intolerance will decrease Outcome: Progressing   Problem: Nutrition: Goal: Adequate nutrition will be maintained Outcome: Progressing   Problem: Elimination: Goal: Will not experience complications related to bowel motility Outcome: Progressing   Problem: Pain Managment: Goal: General experience of comfort will improve Outcome: Progressing   Problem: Safety: Goal: Ability to remain free from injury will improve Outcome: Progressing   Problem: Skin Integrity: Goal: Risk for impaired skin integrity will decrease Outcome: Progressing

## 2023-06-23 NOTE — Anesthesia Postprocedure Evaluation (Signed)
Anesthesia Post Note  Patient: Alexandra Gray  Procedure(s) Performed: LAPAROSCOPIC CHOLECYSTECTOMY WITH INTRAOPERATIVE CHOLANGIOGRAM (Abdomen)     Patient location during evaluation: PACU Anesthesia Type: General Level of consciousness: awake and alert, patient cooperative and oriented Pain management: pain level controlled Vital Signs Assessment: post-procedure vital signs reviewed and stable Respiratory status: spontaneous breathing, nonlabored ventilation and respiratory function stable Cardiovascular status: blood pressure returned to baseline and stable Postop Assessment: no apparent nausea or vomiting Anesthetic complications: no   No notable events documented.  Last Vitals:  Vitals:   06/23/23 1255 06/23/23 1311  BP: 129/68 125/63  Pulse: 73 74  Resp: 16 16  Temp: 36.5 C 36.8 C  SpO2: 95% 100%    Last Pain:  Vitals:   06/23/23 1311  TempSrc: Oral  PainSc:                  Chanley Mcenery,E. [place]

## 2023-06-23 NOTE — Progress Notes (Signed)
Subjective: Still with some minor RUQ pain, but better overall.  Objective: Vital signs in last 24 hours: Temp:  [98.6 F (37 C)-98.8 F (37.1 C)] 98.6 F (37 C) (07/27 0527) Pulse Rate:  [65-71] 68 (07/27 0527) Resp:  [16] 16 (07/26 1528) BP: (109-119)/(67-78) 119/78 (07/27 0527) SpO2:  [100 %] 100 % (07/27 0527) Weight:  [143 kg] 143 kg (07/27 0500) Last BM Date : 06/19/23  Intake/Output from previous day: 07/26 0701 - 07/27 0700 In: 1198.5 [P.O.:240; I.V.:958.5] Out: -  Intake/Output this shift: No intake/output data recorded.  General appearance: alert and no distress GI: some RUQ tenderness  Lab Results: Recent Labs    06/21/23 0310 06/22/23 0000 06/23/23 0120  WBC 8.5 9.2 8.2  HGB 12.9 13.1 14.4  HCT 40.4 41.1 46.2*  PLT 251 258 135*   BMET Recent Labs    06/21/23 0310 06/22/23 0000 06/23/23 0120  NA 136 135 135  K 3.3* 3.4* 3.8  CL 99 101 103  CO2 21* 24 22  GLUCOSE 102* 79 91  BUN 6 7 <5*  CREATININE 0.68 0.69 0.60  CALCIUM 8.6* 8.6* 8.7*   LFT Recent Labs    06/21/23 0310 06/22/23 0000 06/23/23 0120  PROT 6.4*   < > 7.1  ALBUMIN 3.0*   < > 3.3*  AST 210*   < > 33  ALT 338*   < > 162*  ALKPHOS 243*   < > 232*  BILITOT 4.1*   < > 0.8  BILIDIR 2.4*  --   --    < > = values in this interval not displayed.   PT/INR Recent Labs    06/21/23 0817  LABPROT 13.6  INR 1.0   Hepatitis Panel No results for input(s): "HEPBSAG", "HCVAB", "HEPAIGM", "HEPBIGM" in the last 72 hours. C-Diff No results for input(s): "CDIFFTOX" in the last 72 hours. Fecal Lactopherrin No results for input(s): "FECLLACTOFRN" in the last 72 hours.  Studies/Results: MR ABDOMEN MRCP W WO CONTAST  Result Date: 06/21/2023 CLINICAL DATA:  Epigastric abdominal pain and vomiting for 3 days. Tenderness to palpation. EXAM: MRI ABDOMEN WITHOUT AND WITH CONTRAST (INCLUDING MRCP) TECHNIQUE: Multiplanar multisequence MR imaging of the abdomen was performed both before and  after the administration of intravenous contrast. Heavily T2-weighted images of the biliary and pancreatic ducts were obtained, and three-dimensional MRCP images were rendered by post processing. CONTRAST:  9mL GADAVIST GADOBUTROL 1 MMOL/ML IV SOLN COMPARISON:  None Available. FINDINGS: Lower chest: No acute findings. Hepatobiliary: No hepatic masses identified. Multiple tiny gallstones are noted. Gallbladder is nondistended, however mild diffuse gallbladder wall thickening is seen, without pericholecystic inflammatory changes. This is nonspecific and may be secondary to pancreatitis. Image degradation by motion artifact noted. Mild diffuse biliary ductal dilatation is seen, with common bile duct measuring up to 8 mm in diameter. A meniscus sign is seen at the junction of the distal common bile duct and ampulla, and an impacted distal common bile duct calculus cannot be excluded. No other biliary calculi identified. Pancreas: Diffuse pancreatic edema is seen, consistent with acute pancreatitis. No evidence of pancreatic mass or necrosis. No evidence of pancreatic ductal dilatation or pancreas divisum. No evidence of peripancreatic fluid collections. Spleen:  Within normal limits in size and appearance. Adrenals/Urinary Tract: No suspicious masses identified. No evidence of hydronephrosis. Stomach/Bowel: Unremarkable. Vascular/Lymphatic: No pathologically enlarged lymph nodes identified. No acute vascular findings. Other:  None. Musculoskeletal:  No suspicious bone lesions identified. IMPRESSION: Image degradation by motion artifact noted. Mild acute  pancreatitis. No evidence of pancreatic necrosis or pseudocysts. Cholelithiasis. Mild diffuse gallbladder wall thickening is nonspecific, and may be secondary to pancreatitis. Consider nuclear medicine HIDA scan if clinically warranted to exclude cholecystitis. Mild diffuse biliary ductal dilatation, with possible impacted distal common bile duct calculus. Recommend  correlation with liver function tests, and consider ERCP. Electronically Signed   By: Danae Orleans M.D.   On: 06/21/2023 11:09   MR 3D Recon At Scanner  Result Date: 06/21/2023 CLINICAL DATA:  Epigastric abdominal pain and vomiting for 3 days. Tenderness to palpation. EXAM: MRI ABDOMEN WITHOUT AND WITH CONTRAST (INCLUDING MRCP) TECHNIQUE: Multiplanar multisequence MR imaging of the abdomen was performed both before and after the administration of intravenous contrast. Heavily T2-weighted images of the biliary and pancreatic ducts were obtained, and three-dimensional MRCP images were rendered by post processing. CONTRAST:  9mL GADAVIST GADOBUTROL 1 MMOL/ML IV SOLN COMPARISON:  None Available. FINDINGS: Lower chest: No acute findings. Hepatobiliary: No hepatic masses identified. Multiple tiny gallstones are noted. Gallbladder is nondistended, however mild diffuse gallbladder wall thickening is seen, without pericholecystic inflammatory changes. This is nonspecific and may be secondary to pancreatitis. Image degradation by motion artifact noted. Mild diffuse biliary ductal dilatation is seen, with common bile duct measuring up to 8 mm in diameter. A meniscus sign is seen at the junction of the distal common bile duct and ampulla, and an impacted distal common bile duct calculus cannot be excluded. No other biliary calculi identified. Pancreas: Diffuse pancreatic edema is seen, consistent with acute pancreatitis. No evidence of pancreatic mass or necrosis. No evidence of pancreatic ductal dilatation or pancreas divisum. No evidence of peripancreatic fluid collections. Spleen:  Within normal limits in size and appearance. Adrenals/Urinary Tract: No suspicious masses identified. No evidence of hydronephrosis. Stomach/Bowel: Unremarkable. Vascular/Lymphatic: No pathologically enlarged lymph nodes identified. No acute vascular findings. Other:  None. Musculoskeletal:  No suspicious bone lesions identified. IMPRESSION:  Image degradation by motion artifact noted. Mild acute pancreatitis. No evidence of pancreatic necrosis or pseudocysts. Cholelithiasis. Mild diffuse gallbladder wall thickening is nonspecific, and may be secondary to pancreatitis. Consider nuclear medicine HIDA scan if clinically warranted to exclude cholecystitis. Mild diffuse biliary ductal dilatation, with possible impacted distal common bile duct calculus. Recommend correlation with liver function tests, and consider ERCP. Electronically Signed   By: Danae Orleans M.D.   On: 06/21/2023 11:09    Medications: Scheduled:  enoxaparin (LOVENOX) injection  40 mg Subcutaneous Q24H   indocyanine green  1.25 mg Intravenous Once   Continuous:  sodium chloride 75 mL/hr at 06/23/23 7253    Assessment/Plan: 1) Cholelithiasis. 2) Gallstone pancreatitis.   Her liver enzymes/TB and clinical progression suggests that she passed a stone.  No ERCP intervention is required at this time.  Plan: 1) Lap chole and IOC per Surgery. 2) Signing off.  LOS: 3 days   Kieley Akter D 06/23/2023, 8:05 AM

## 2023-06-23 NOTE — Progress Notes (Addendum)
Progress Note    Alexandra Gray  OZH:[id number] DOB: 11-25-1991  DOA: 06/20/2023 PCP: Fleet Contras, MD      Brief Narrative:    Medical records reviewed and are as summarized below:  Alexandra Gray is a 32 y.o. female with past medical history significant for hypertension, morbid obesity, who presented to the hospital with abdominal pain, nausea and vomiting.   She was found to have acute gallstone pancreatitis and acute cholecystitis.    Assessment/Plan:   Principal Problem:   Acute pancreatitis Active Problems:   History of essential hypertension   Nausea & vomiting   Epigastric pain   SIRS (systemic inflammatory response syndrome) (HCC)   Hypokalemia   Transaminitis   Gallstone pancreatitis   Choledocholithiasis   Acute cholecystitis    Body mass index is 61.57 kg/m.  (Morbid obesity)   Acute gallstone pancreatitis, elevated liver enzymes: Liver enzymes are improving.  There was no indication for ERCP per gastroenterologist. Lipase is down to 99 from 1,763.  Hyperbilirubinemia has resolved.   Acute cholecystitis: S/p laparoscopic cholecystectomy on 06/23/2023 she is on clear liquid diet.  Analgesics as needed for pain.  Antiemetics as needed for nausea/vomiting.  Follow-up with general surgeon.   Hypokalemia: Improved   Thrombocytopenia: Platelet is trending down.  Repeat CBC tomorrow    Diet Order             Diet clear liquid Fluid consistency: Thin  Diet effective now                            Consultants: General surgeon Gastroenterologist  Procedures: Laparoscopic cholecystectomy with intraoperative cholangiogram on 06/23/2023    Medications:    enoxaparin (LOVENOX) injection  40 mg Subcutaneous Q24H   HYDROmorphone       oxyCODONE       Continuous Infusions:  sodium chloride 75 mL/hr at 06/23/23 0806     Anti-infectives (From admission, onward)    None              Family  Communication/Anticipated D/C date and plan/Code Status   DVT prophylaxis: enoxaparin (LOVENOX) injection 40 mg Start: 06/22/23 1600 SCDs Start: 06/21/23 0012     Code Status: Full Code  Family Communication: None Disposition Plan: Plan to discharge home in 1 to 2 days   Status is: Inpatient Remains inpatient appropriate because: S/p laparoscopic cholecystectomy       Subjective:   Interval events noted.  She complains of "too much gas" in the abdomen and abdominal discomfort.  She also complains of hunger.  Objective:    Vitals:   06/23/23 1245 06/23/23 1255 06/23/23 1311 06/23/23 1556  BP: 130/72 129/68 125/63 (!) 140/101  Pulse: 77 73 74 85  Resp: 16 16 16 16   Temp:  97.7 F (36.5 C) 98.2 F (36.8 C) 97.8 F (36.6 C)  TempSrc:   Oral Oral  SpO2: 94% 95% 100% 98%  Weight:      Height:       No data found.   Intake/Output Summary (Last 24 hours) at 06/23/2023 1608 Last data filed at 06/23/2023 1327 Gross per 24 hour  Intake 1936.96 ml  Output 275 ml  Net 1661.96 ml   Filed Weights   06/22/23 0406 06/23/23 0500 06/23/23 0851  Weight: (!) 143.4 kg (!) 143 kg (!) 143 kg    Exam:  GEN: NAD SKIN: No rash EYES: EOMI ENT: MMM CV: RRR  PULM: CTA B ABD: soft, obese, mild surgical tenderness, +BS CNS: AAO x 3, non focal EXT: No edema or tenderness        Data Reviewed:   I have personally reviewed following labs and imaging studies:  Labs: Labs show the following:   Basic Metabolic Panel: Recent Labs  Lab 06/20/23 1819 06/21/23 0310 06/22/23 0000 06/23/23 0120  NA 137 136 135 135  K 3.4* 3.3* 3.4* 3.8  CL 103 99 101 103  CO2 22 21* 24 22  GLUCOSE 132* 102* 79 91  BUN 5* 6 7 <5*  CREATININE 0.80 0.68 0.69 0.60  CALCIUM 8.9 8.6* 8.6* 8.7*  MG  --  2.0 2.0 2.0  PHOS  --   --  3.4 3.8   GFR Estimated Creatinine Clearance: 134.7 mL/min (by C-G formula based on SCr of 0.6 mg/dL). Liver Function Tests: Recent Labs  Lab 06/20/23 1819  06/21/23 0310 06/22/23 0000 06/23/23 0120  AST 369* 210* 79* 33  ALT 459* 338* 237* 162*  ALKPHOS 293* 243* 231* 232*  BILITOT 3.2* 4.1* 1.4* 0.8  PROT 7.8 6.4* 6.9 7.1  ALBUMIN 3.7 3.0* 3.2* 3.3*   Recent Labs  Lab 06/20/23 1819 06/21/23 0817 06/23/23 0120  LIPASE 1,763* 391* 99*   No results for input(s): "AMMONIA" in the last 168 hours. Coagulation profile Recent Labs  Lab 06/21/23 0817  INR 1.0    CBC: Recent Labs  Lab 06/20/23 1819 06/21/23 0310 06/22/23 0000 06/23/23 0120  WBC 12.2* 8.5 9.2 8.2  NEUTROABS  --  5.7  --   --   HGB 14.7 12.9 13.1 14.4  HCT 45.2 40.4 41.1 46.2*  MCV 85.3 84.7 84.2 89.4  PLT 326 251 258 135*   Cardiac Enzymes: No results for input(s): "CKTOTAL", "CKMB", "CKMBINDEX", "TROPONINI" in the last 168 hours. BNP (last 3 results) No results for input(s): "PROBNP" in the last 8760 hours. CBG: No results for input(s): "GLUCAP" in the last 168 hours. D-Dimer: No results for input(s): "DDIMER" in the last 72 hours. Hgb A1c: No results for input(s): "HGBA1C" in the last 72 hours. Lipid Profile: Recent Labs    06/21/23 0338  TRIG 54   Thyroid function studies: No results for input(s): "TSH", "T4TOTAL", "T3FREE", "THYROIDAB" in the last 72 hours.  Invalid input(s): "FREET3" Anemia work up: No results for input(s): "VITAMINB12", "FOLATE", "FERRITIN", "TIBC", "IRON", "RETICCTPCT" in the last 72 hours. Sepsis Labs: Recent Labs  Lab 06/20/23 1819 06/21/23 0310 06/22/23 0000 06/23/23 0120  WBC 12.2* 8.5 9.2 8.2    Microbiology No results found for this or any previous visit (from the past 240 hour(s)).  Procedures and diagnostic studies:  DG Cholangiogram Operative  Result Date: 06/23/2023 CLINICAL DATA:  Laparoscopic cholecystectomy with intraoperative cholangiogram. EXAM: INTRAOPERATIVE CHOLANGIOGRAM TECHNIQUE: Cholangiographic images from the C-arm fluoroscopic device were submitted for interpretation post-operatively.  Please see the procedural report for the amount of contrast and the fluoroscopy time utilized. FLUOROSCOPY: Radiation Exposure Index (as provided by the fluoroscopic device): 11.23 mGy Kerma COMPARISON:  MRCP 06/21/2023 FINDINGS: Intraoperative saved images and cine clips are submitted for review. The images demonstrate cannulation of the cystic duct remanent and opacification of the biliary tree. No evidence of biliary ductal dilatation, stenosis or stricture. No choledocholithiasis. Contrast material passes freely through the ampulla and into the duodenum. IMPRESSION: Negative intraoperative cholangiogram. Electronically Signed   By: Malachy Moan M.D.   On: 06/23/2023 13:15   DG C-Arm 1-60 Min-No Report  Result Date: 06/23/2023 Fluoroscopy  was utilized by the requesting physician.  No radiographic interpretation.               LOS: 3 days   Edgar Reisz  [facility]   Pager on [email or web address]. If 7PM-7AM, please contact night-coverage at [email or web address]     06/23/2023, 4:08 PM

## 2023-06-23 NOTE — Progress Notes (Signed)
General Surgery  Pain improved today, Tbili normal. Proceed to OR for lap cholecystectomy. I examined the patient in preop and reviewed the procedure details. All questions were answered.  Sophronia Simas, MD [facility] General, Hepatobiliary and Pancreatic Surgery 06/23/23 10:12 AM

## 2023-06-23 NOTE — Plan of Care (Signed)
  Problem: Education: Goal: Knowledge of General Education information will improve Description: Including pain rating scale, medication(s)/side effects and non-pharmacologic comfort measures Outcome: Progressing   Problem: Clinical Measurements: Goal: Ability to maintain clinical measurements within normal limits will improve Outcome: Progressing Goal: Will remain free from infection Outcome: Progressing Goal: Diagnostic test results will improve Outcome: Progressing Goal: Respiratory complications will improve Outcome: Progressing Goal: Cardiovascular complication will be avoided Outcome: Progressing   Problem: Health Behavior/Discharge Planning: Goal: Ability to manage health-related needs will improve Outcome: Progressing   Problem: Activity: Goal: Risk for activity intolerance will decrease Outcome: Progressing

## 2023-06-23 NOTE — Anesthesia Preprocedure Evaluation (Addendum)
Anesthesia Evaluation  Patient identified by MRN, date of birth, ID band Patient awake    Reviewed: Allergy & Precautions, NPO status , Patient's Chart, lab work & pertinent test results  History of Anesthesia Complications Negative for: history of anesthetic complications  Airway Mallampati: II  TM Distance: >3 FB Neck ROM: Full    Dental  (+) Dental Advisory Given   Pulmonary COPD,  COPD inhaler, Current Smoker and Patient abstained from smoking.   breath sounds clear to auscultation       Cardiovascular hypertension (no medications),  Rhythm:Regular Rate:Normal     Neuro/Psych  PSYCHIATRIC DISORDERS Anxiety Depression    PTSD   GI/Hepatic Neg liver ROS,,,Abd pain with acute chole   Endo/Other  BMI 61  Renal/GU negative Renal ROS     Musculoskeletal   Abdominal  (+) + obese  Peds  Hematology Hb 14.4, plt 135k   Anesthesia Other Findings   Reproductive/Obstetrics                              Anesthesia Physical Anesthesia Plan  ASA: 3  Anesthesia Plan: General   Post-op Pain Management: Tylenol PO (pre-op)* and Regional block*   Induction: Intravenous  PONV Risk Score and Plan: 2 and Ondansetron and Dexamethasone  Airway Management Planned: Oral ETT  Additional Equipment: None  Intra-op Plan:   Post-operative Plan: Extubation in OR  Informed Consent: I have reviewed the patients History and Physical, chart, labs and discussed the procedure including the risks, benefits and alternatives for the proposed anesthesia with the patient or authorized representative who has indicated his/her understanding and acceptance.     Dental advisory given  Plan Discussed with: CRNA and Surgeon  Anesthesia Plan Comments:         Anesthesia Quick Evaluation

## 2023-06-23 NOTE — Transfer of Care (Signed)
Immediate Anesthesia Transfer of Care Note  Patient: Alexandra Gray  Procedure(s) Performed: LAPAROSCOPIC CHOLECYSTECTOMY WITH INTRAOPERATIVE CHOLANGIOGRAM (Abdomen)  Patient Location: PACU  Anesthesia Type:General  Level of Consciousness: awake and alert   Airway & Oxygen Therapy: Patient Spontanous Breathing and Patient connected to nasal cannula oxygen  Post-op Assessment: Report given to RN and Post -op Vital signs reviewed and stable  Post vital signs: Reviewed and stable  Last Vitals:  Vitals Value Taken Time  BP 135/67 06/23/23 1205  Temp    Pulse 81 06/23/23 1207  Resp 25 06/23/23 1207  SpO2 96 % 06/23/23 1207  Vitals shown include unfiled device data.  Last Pain:  Vitals:   06/23/23 0814  TempSrc: Oral  PainSc:       Patients Stated Pain Goal: 0 (06/23/23 0800)  Complications: No notable events documented.

## 2023-06-23 NOTE — Op Note (Signed)
Date: 06/23/23  Patient: Alexandra Gray MRN: [id number]  Preoperative Diagnosis: Gallstone pancreatitis Postoperative Diagnosis: Gallstone pancreatitis, acute cholecystitis  Procedure: Laparoscopic cholecystectomy with intraoperative cholangiogram  Surgeon: Sophronia Simas, MD  EBL: 50 mL  Anesthesia: General endotracheal  Specimens: Gallbladder  Indications: Ms. Regner is a 32 yo female who presented with acute epigastric abdominal pain, nausea and vomiting. Imaging in the ED showed cholelithiasis, as well as a possible stone in the distal common bile duct. Total bilirubin was mildly elevated at 1.4 and normalized this morning. After a discussion of the risks and benefits of surgery, she agreed to proceed with cholecystectomy.  Findings: Cholelithiasis, mild gallbladder wall edema consistent with acute cholecystitis. Transient filling defects in the common bile duct most consistent with air bubbles, no evidence of biliary obstruction.  Procedure details: Informed consent was obtained in the preoperative area prior to the procedure. The patient was brought to the operating room and placed on the table in the supine position. General anesthesia was induced and appropriate lines and drains were placed for intraoperative monitoring. Perioperative antibiotics were administered per SCIP guidelines. The abdomen was prepped and draped in the usual sterile fashion. A pre-procedure timeout was taken verifying patient identity, surgical site and procedure to be performed.  A small suprafraumbilical skin incision was made, the subcutaneous tissue was divided with cautery, and the umbilical stalk was grasped and elevated.  A Veress needle was inserted through the fascia, intraperitoneal placement was confirmed with the saline drop test, and the abdomen was insufflated.  A 5 mm Visiport was placed and the peritoneal cavity was inspected with no evidence of visceral or vascular injury.  A 5 mm port was  placed in the subxiphoid position under direct visualization.  The umbilical port was then upsized to a 12 mm port under direct visualization.  Two 5 mm ports were placed in the right subcostal margin under direct visualization.  The fundus of the gallbladder was grasped and retracted cephalad. The infundibulum was retracted laterally.  The peritoneum overlying the gallbladder was incised and the cystic triangle was dissected out using cautery and blunt dissection. The critical view of safety was obtained, and the cystic artery was clipped and divided with cautery.  A ductotomy was made on the cystic duct, a small stab incision was made in the right subcostal margin, and a cholangiocatheter was passed through the abdominal wall.  The catheter was flushed and used to cannulate the cystic duct, and secured in place with a clip.  A cholangiogram was performed with fluoroscopy.  There was filling of the cystic duct and the entire common bile duct, with prompt filling of the duodenum with contrast.  Several small transient filling defects were noted in the distal common bile duct which promptly disappeared, most consistent with small air bubbles.  No obstructive filling defects were noted in the common bile duct.  The cholangiocatheter was removed, clips were placed on the cystic duct proximal and distal to the ductotomy, and the cystic duct was divided at the site of the ductotomy.  The clip on the cystic duct stump did not completely cross the duct, so the cystic duct stump was also ligated with a PDS Endoloop.  The gallbladder was taken off the liver using cautery, and the specimen was placed in an endocatch bag. The surgical site was irrigated with saline until the effluent was clear. Hemostasis was achieved in the gallbladder fossa using cautery. The cystic duct and artery stumps were visually inspected and there was  no evidence of bile leak or bleeding.  The specimen was extracted via the umbilical port site, the  umbilical port site fascia was closed with 0 Vicryl figure-of-eight suture using a PMI.  The remaining ports were removed and the abdomen was desufflated.  The skin at all port sites was closed with 4-0 monocryl subcuticular suture. Dermabond was applied.  The patient tolerated the procedure well with no apparent complications. All counts were correct x2 at the end of the procedure. The patient was extubated and taken to PACU in stable condition.  Sophronia Simas, MD 06/23/23 11:59 AM

## 2023-06-24 ENCOUNTER — Encounter (HOSPITAL_COMMUNITY): Payer: Self-pay | Admitting: Surgery

## 2023-06-24 DIAGNOSIS — K810 Acute cholecystitis: Secondary | ICD-10-CM | POA: Diagnosis not present

## 2023-06-24 DIAGNOSIS — K851 Biliary acute pancreatitis without necrosis or infection: Secondary | ICD-10-CM | POA: Diagnosis not present

## 2023-06-24 LAB — CBC WITH DIFFERENTIAL/PLATELET
Abs Immature Granulocytes: 0.07 10*3/uL (ref 0.00–0.07)
Basophils Absolute: 0 10*3/uL (ref 0.0–0.1)
Basophils Relative: 0 %
Eosinophils Absolute: 0 10*3/uL (ref 0.0–0.5)
Eosinophils Relative: 0 %
HCT: 40.4 % (ref 36.0–46.0)
Hemoglobin: 13.1 g/dL (ref 12.0–15.0)
Immature Granulocytes: 1 %
Lymphocytes Relative: 11 %
Lymphs Abs: 1.3 10*3/uL (ref 0.7–4.0)
MCH: 27.1 pg (ref 26.0–34.0)
MCHC: 32.4 g/dL (ref 30.0–36.0)
MCV: 83.5 fL (ref 80.0–100.0)
Monocytes Absolute: 0.6 10*3/uL (ref 0.1–1.0)
Monocytes Relative: 5 %
Neutro Abs: 10.2 10*3/uL — ABNORMAL HIGH (ref 1.7–7.7)
Neutrophils Relative %: 83 %
Platelets: 309 10*3/uL (ref 150–400)
RBC: 4.84 MIL/uL (ref 3.87–5.11)
RDW: 14.6 % (ref 11.5–15.5)
WBC: 12.2 10*3/uL — ABNORMAL HIGH (ref 4.0–10.5)
nRBC: 0 % (ref 0.0–0.2)

## 2023-06-24 LAB — COMPREHENSIVE METABOLIC PANEL WITH GFR
ALT: 130 U/L — ABNORMAL HIGH (ref 0–44)
AST: 45 U/L — ABNORMAL HIGH (ref 15–41)
Albumin: 3.2 g/dL — ABNORMAL LOW (ref 3.5–5.0)
Alkaline Phosphatase: 190 U/L — ABNORMAL HIGH (ref 38–126)
Anion gap: 9 (ref 5–15)
BUN: <5 mg/dL — ABNORMAL LOW (ref 6–20)
CO2: 23 mmol/L (ref 22–32)
Calcium: 8.7 mg/dL — ABNORMAL LOW (ref 8.9–10.3)
Chloride: 103 mmol/L (ref 98–111)
Creatinine, Ser: 0.69 mg/dL (ref 0.44–1.00)
GFR, Estimated: >60 mL/min (ref >60)
Glucose, Bld: 126 mg/dL — ABNORMAL HIGH (ref 70–99)
Potassium: 3.9 mmol/L (ref 3.5–5.1)
Sodium: 135 mmol/L (ref 135–145)
Total Bilirubin: 0.8 mg/dL (ref 0.3–1.2)
Total Protein: 7.2 g/dL (ref 6.5–8.1)

## 2023-06-24 MED ORDER — KETOROLAC TROMETHAMINE 15 MG/ML IJ SOLN
15.0000 mg | Freq: Three times a day (TID) | INTRAMUSCULAR | Status: DC
  Administered 2023-06-24: 15 mg via INTRAVENOUS
  Filled 2023-06-24: qty 1

## 2023-06-24 MED ORDER — OXYCODONE HCL 5 MG PO TABS: 5.0000 mg | ORAL_TABLET | ORAL | 0 refills | Status: DC | PRN

## 2023-06-24 MED ORDER — ACETAMINOPHEN 500 MG PO TABS
1000.0000 mg | ORAL_TABLET | Freq: Four times a day (QID) | ORAL | Status: DC
  Filled 2023-06-24: qty 2

## 2023-06-24 MED ORDER — OXYCODONE HCL 5 MG PO TABS: 5.0000 mg | ORAL_TABLET | ORAL | Status: DC | PRN

## 2023-06-24 MED ORDER — ACETAMINOPHEN 500 MG PO TABS: 1000.0000 mg | ORAL_TABLET | Freq: Four times a day (QID) | ORAL | Status: DC | PRN

## 2023-06-24 NOTE — Progress Notes (Signed)
1 Day Post-Op  Subjective: Some soreness as expected.  Up in a chair.  Tolerating liquids with no nausea.  Pain seems well controlled.  ROS: See above, otherwise other systems negative  Objective: Vital signs in last 24 hours: Temp:  [97.7 F (36.5 C)-98.9 F (37.2 C)] 98.9 F (37.2 C) (07/28 0808) Pulse Rate:  [69-85] 77 (07/28 0808) Resp:  [11-20] 18 (07/28 0808) BP: (104-140)/(63-101) 133/93 (07/28 0808) SpO2:  [93 %-100 %] 100 % (07/28 0808) Weight:  [139.7 kg-143 kg] 139.7 kg (07/28 0500) Last BM Date : 06/19/23  Intake/Output from previous day: 07/27 0701 - 07/28 0700 In: 1501.6 [I.V.:1501.6] Out: 275 [Urine:250; Blood:25] Intake/Output this shift: No intake/output data recorded.  PE: Abd: soft, appropriately tender, +BS, ND, obese, incisions c/d/i  Lab Results:  Recent Labs    06/23/23 0120 06/24/23 0131  WBC 8.2 12.2*  HGB 14.4 13.1  HCT 46.2* 40.4  PLT 135* 309   BMET Recent Labs    06/23/23 0120 06/24/23 0131  NA 135 135  K 3.8 3.9  CL 103 103  CO2 22 23  GLUCOSE 91 126*  BUN <5* <5*  CREATININE 0.60 0.69  CALCIUM 8.7* 8.7*   PT/INR No results for input(s): "LABPROT", "INR" in the last 72 hours. CMP     Component Value Date/Time   NA 135 06/24/2023 0131   NA 138 11/12/2018 1104   K 3.9 06/24/2023 0131   CL 103 06/24/2023 0131   CO2 23 06/24/2023 0131   GLUCOSE 126 (H) 06/24/2023 0131   BUN <5 (L) 06/24/2023 0131   BUN 4 (L) 11/12/2018 1104   CREATININE 0.69 06/24/2023 0131   CREATININE 0.60 06/29/2021 0000   CALCIUM 8.7 (L) 06/24/2023 0131   PROT 7.2 06/24/2023 0131   PROT 6.5 11/12/2018 1104   ALBUMIN 3.2 (L) 06/24/2023 0131   ALBUMIN 3.6 11/12/2018 1104   AST 45 (H) 06/24/2023 0131   ALT 130 (H) 06/24/2023 0131   ALKPHOS 190 (H) 06/24/2023 0131   BILITOT 0.8 06/24/2023 0131   BILITOT 0.2 11/12/2018 1104   GFRNONAA >60 06/24/2023 0131   GFRAA >60 06/09/2020 0241   Lipase     Component Value Date/Time   LIPASE 99 (H)  06/23/2023 0120       Studies/Results: DG Cholangiogram Operative  Result Date: 06/23/2023 CLINICAL DATA:  Laparoscopic cholecystectomy with intraoperative cholangiogram. EXAM: INTRAOPERATIVE CHOLANGIOGRAM TECHNIQUE: Cholangiographic images from the C-arm fluoroscopic device were submitted for interpretation post-operatively. Please see the procedural report for the amount of contrast and the fluoroscopy time utilized. FLUOROSCOPY: Radiation Exposure Index (as provided by the fluoroscopic device): 11.23 mGy Kerma COMPARISON:  MRCP 06/21/2023 FINDINGS: Intraoperative saved images and cine clips are submitted for review. The images demonstrate cannulation of the cystic duct remanent and opacification of the biliary tree. No evidence of biliary ductal dilatation, stenosis or stricture. No choledocholithiasis. Contrast material passes freely through the ampulla and into the duodenum. IMPRESSION: Negative intraoperative cholangiogram. Electronically Signed   By: Malachy Moan M.D.   On: 06/23/2023 13:15   DG C-Arm 1-60 Min-No Report  Result Date: 06/23/2023 Fluoroscopy was utilized by the requesting physician.  No radiographic interpretation.    Anti-infectives: Anti-infectives (From admission, onward)    None        Assessment/Plan POD 1, s/p lap chole for gsp and cholecystitis, Dr. Freida Busman 06/23/23 -advanced to solid diet, but tolerating CLD well -tylenol and oxy ordered -surgically stable for DC home.  LFTs downtrending -follow up sent  for and Rx for oxy sent to pharmacy. -relayed surgical stability to primary service.   FEN - regular diet VTE - lovenox ID - none currently    LOS: 4 days    Letha Cape , [facility] Surgery 06/24/2023, 8:25 AM Please see Amion for pager number during day hours 7:00am-4:30pm or 7:00am -11:30am on weekends

## 2023-06-24 NOTE — Discharge Instructions (Signed)

## 2023-06-24 NOTE — Discharge Summary (Signed)
Physician Discharge Summary   Patient: Alexandra Gray MRN: [id number] DOB: November 07, 1991  Admit date:     06/20/2023  Discharge date: 06/24/23  Discharge Physician: Lurene Shadow   PCP: Fleet Contras, MD   Recommendations at discharge:   Follow-up with PCP in 1 to 2 weeks Follow-up with general surgeon in 2 to 3 weeks  Discharge Diagnoses: Principal Problem:   Acute pancreatitis Active Problems:   History of essential hypertension   Nausea & vomiting   Epigastric pain   SIRS (systemic inflammatory response syndrome) (HCC)   Hypokalemia   Transaminitis   Gallstone pancreatitis   Choledocholithiasis   Acute cholecystitis  Resolved Problems:   * No resolved hospital problems. *  Hospital Course:  Alexandra Gray is a 32 y.o. female with past medical history significant for hypertension, morbid obesity, who presented to the hospital with abdominal pain, nausea and vomiting.     She was found to have acute gallstone pancreatitis and acute cholecystitis.    Assessment and Plan:   Acute gallstone pancreatitis, elevated liver enzymes: Liver enzymes are improving.  There was no indication for ERCP per gastroenterologist. Lipase is down to 99 from 1,763.  Hyperbilirubinemia has resolved. AST and ALT levels are improving.     Acute cholecystitis: S/p laparoscopic cholecystectomy on 06/23/2023.  She is tolerating regular diet.  Okay for discharge from surgeon's standpoint.  She is on clear liquid diet.  Analgesics as needed for pain.  Antiemetics as      Hypokalemia: Resolved     Thrombocytopenia: Resolved    Of note, patient said she was recently diagnosed with STI (chlamydia and trichomonas infection) and was recently prescribed doxycycline       Pain control - [place] Controlled Substance Reporting System database was reviewed. and patient was instructed, not to drive, operate heavy machinery, perform activities at heights, swimming or participation in  water activities or provide baby-sitting services while on Pain, Sleep and Anxiety Medications; until their outpatient Physician has advised to do so again. Also recommended to not to take more than prescribed Pain, Sleep and Anxiety Medications.  Consultants: General surgeon, gastroenterologist Procedures performed: Laparoscopic cholecystectomy with intraoperative cholangiogram on 06/23/2023   Disposition: Home Diet recommendation:  Discharge Diet Orders (From admission, onward)     Start     Ordered   06/24/23 0000  Diet - low sodium heart healthy        06/24/23 1023           Cardiac diet DISCHARGE MEDICATION: Allergies as of 06/24/2023       Reactions   Infuvite Adult [multiple Vitamin] Shortness Of Breath   Nubain [nalbuphine Hcl] Other (See Comments)   Makes patient hot        Medication List     TAKE these medications    acetaminophen 500 MG tablet Commonly known as: TYLENOL Take 2 tablets (1,000 mg total) by mouth every 6 (six) hours as needed.   albuterol 108 (90 Base) MCG/ACT inhaler Commonly known as: VENTOLIN HFA Inhale 2 puffs into the lungs every 6 (six) hours as needed for wheezing or shortness of breath.   doxycycline 100 MG tablet Commonly known as: ADOXA Take 100 mg by mouth 2 (two) times daily.   metroNIDAZOLE 500 MG tablet Commonly known as: FLAGYL Take 500 mg by mouth 2 (two) times daily.   oxyCODONE 5 MG immediate release tablet Commonly known as: Oxy IR/ROXICODONE Take 1 tablet (5 mg total) by mouth every 4 (four)  hours as needed for moderate pain.        Follow-up Information     Maczis, Hedda Slade, PA-C Follow up in 3 week(s).   Specialty: General Surgery Why: Office will call you with a follow up appointment, If you don't hear from the office, please call, Arrive 30 minutes prior to your appointment time, Please bring your insurance card and photo ID Contact information: [address] [facility] [place] [address] [phone]                Discharge Exam: Filed Weights   06/23/23 0500 06/23/23 0851 06/24/23 0500  Weight: (!) 143 kg (!) 143 kg (!) 139.7 kg   GEN: NAD SKIN: Warm and dry EYES: No pallor or icterus ENT: MMM CV: RRR PULM: CTA B ABD: soft, obese, mild surgical tenderness, no rebound tenderness or guarding, +BS CNS: AAO x 3, non focal EXT: No edema or tenderness   Condition at discharge: good  The results of significant diagnostics from this hospitalization (including imaging, microbiology, ancillary and laboratory) are listed below for reference.   Imaging Studies: DG Cholangiogram Operative  Result Date: 06/23/2023 CLINICAL DATA:  Laparoscopic cholecystectomy with intraoperative cholangiogram. EXAM: INTRAOPERATIVE CHOLANGIOGRAM TECHNIQUE: Cholangiographic images from the C-arm fluoroscopic device were submitted for interpretation post-operatively. Please see the procedural report for the amount of contrast and the fluoroscopy time utilized. FLUOROSCOPY: Radiation Exposure Index (as provided by the fluoroscopic device): 11.23 mGy Kerma COMPARISON:  MRCP 06/21/2023 FINDINGS: Intraoperative saved images and cine clips are submitted for review. The images demonstrate cannulation of the cystic duct remanent and opacification of the biliary tree. No evidence of biliary ductal dilatation, stenosis or stricture. No choledocholithiasis. Contrast material passes freely through the ampulla and into the duodenum. IMPRESSION: Negative intraoperative cholangiogram. Electronically Signed   By: Malachy Moan M.D.   On: 06/23/2023 13:15   DG C-Arm 1-60 Min-No Report  Result Date: 06/23/2023 Fluoroscopy was utilized by the requesting physician.  No radiographic interpretation.   MR ABDOMEN MRCP W WO CONTAST  Result Date: 06/21/2023 CLINICAL DATA:  Epigastric abdominal pain and vomiting for 3 days. Tenderness to palpation. EXAM: MRI ABDOMEN WITHOUT AND  WITH CONTRAST (INCLUDING MRCP) TECHNIQUE: Multiplanar multisequence MR imaging of the abdomen was performed both before and after the administration of intravenous contrast. Heavily T2-weighted images of the biliary and pancreatic ducts were obtained, and three-dimensional MRCP images were rendered by post processing. CONTRAST:  9mL GADAVIST GADOBUTROL 1 MMOL/ML IV SOLN COMPARISON:  None Available. FINDINGS: Lower chest: No acute findings. Hepatobiliary: No hepatic masses identified. Multiple tiny gallstones are noted. Gallbladder is nondistended, however mild diffuse gallbladder wall thickening is seen, without pericholecystic inflammatory changes. This is nonspecific and may be secondary to pancreatitis. Image degradation by motion artifact noted. Mild diffuse biliary ductal dilatation is seen, with common bile duct measuring up to 8 mm in diameter. A meniscus sign is seen at the junction of the distal common bile duct and ampulla, and an impacted distal common bile duct calculus cannot be excluded. No other biliary calculi identified. Pancreas: Diffuse pancreatic edema is seen, consistent with acute pancreatitis. No evidence of pancreatic mass or necrosis. No evidence of pancreatic ductal dilatation or pancreas divisum. No evidence of peripancreatic fluid collections. Spleen:  Within normal limits in size and appearance. Adrenals/Urinary Tract: No suspicious masses identified. No evidence of hydronephrosis. Stomach/Bowel: Unremarkable. Vascular/Lymphatic: No pathologically enlarged lymph nodes identified. No acute vascular findings. Other:  None. Musculoskeletal:  No  suspicious bone lesions identified. IMPRESSION: Image degradation by motion artifact noted. Mild acute pancreatitis. No evidence of pancreatic necrosis or pseudocysts. Cholelithiasis. Mild diffuse gallbladder wall thickening is nonspecific, and may be secondary to pancreatitis. Consider nuclear medicine HIDA scan if clinically warranted to exclude  cholecystitis. Mild diffuse biliary ductal dilatation, with possible impacted distal common bile duct calculus. Recommend correlation with liver function tests, and consider ERCP. Electronically Signed   By: Danae Orleans M.D.   On: 06/21/2023 11:09   MR 3D Recon At Scanner  Result Date: 06/21/2023 CLINICAL DATA:  Epigastric abdominal pain and vomiting for 3 days. Tenderness to palpation. EXAM: MRI ABDOMEN WITHOUT AND WITH CONTRAST (INCLUDING MRCP) TECHNIQUE: Multiplanar multisequence MR imaging of the abdomen was performed both before and after the administration of intravenous contrast. Heavily T2-weighted images of the biliary and pancreatic ducts were obtained, and three-dimensional MRCP images were rendered by post processing. CONTRAST:  9mL GADAVIST GADOBUTROL 1 MMOL/ML IV SOLN COMPARISON:  None Available. FINDINGS: Lower chest: No acute findings. Hepatobiliary: No hepatic masses identified. Multiple tiny gallstones are noted. Gallbladder is nondistended, however mild diffuse gallbladder wall thickening is seen, without pericholecystic inflammatory changes. This is nonspecific and may be secondary to pancreatitis. Image degradation by motion artifact noted. Mild diffuse biliary ductal dilatation is seen, with common bile duct measuring up to 8 mm in diameter. A meniscus sign is seen at the junction of the distal common bile duct and ampulla, and an impacted distal common bile duct calculus cannot be excluded. No other biliary calculi identified. Pancreas: Diffuse pancreatic edema is seen, consistent with acute pancreatitis. No evidence of pancreatic mass or necrosis. No evidence of pancreatic ductal dilatation or pancreas divisum. No evidence of peripancreatic fluid collections. Spleen:  Within normal limits in size and appearance. Adrenals/Urinary Tract: No suspicious masses identified. No evidence of hydronephrosis. Stomach/Bowel: Unremarkable. Vascular/Lymphatic: No pathologically enlarged lymph nodes  identified. No acute vascular findings. Other:  None. Musculoskeletal:  No suspicious bone lesions identified. IMPRESSION: Image degradation by motion artifact noted. Mild acute pancreatitis. No evidence of pancreatic necrosis or pseudocysts. Cholelithiasis. Mild diffuse gallbladder wall thickening is nonspecific, and may be secondary to pancreatitis. Consider nuclear medicine HIDA scan if clinically warranted to exclude cholecystitis. Mild diffuse biliary ductal dilatation, with possible impacted distal common bile duct calculus. Recommend correlation with liver function tests, and consider ERCP. Electronically Signed   By: Danae Orleans M.D.   On: 06/21/2023 11:09   US Abdomen Limited RUQ (LIVER/GB)  Result Date: 06/20/2023 CLINICAL DATA:  Right upper quadrant abdominal pain. EXAM: ULTRASOUND ABDOMEN LIMITED RIGHT UPPER QUADRANT COMPARISON:  Ultrasound dated 06/07/2019. FINDINGS: Gallbladder: Multiple gallstones. No gallbladder wall thickening or pericholecystic fluid. Negative sonographic Murphy's sign. Common bile duct: Diameter: 7 mm Liver: There is diffuse increased liver echogenicity most commonly seen in the setting of fatty infiltration. Superimposed inflammation or fibrosis is not excluded. Clinical correlation is recommended. A 1.9 cm echogenic structure in the porta hepatis likely represents periportal fat or tissue interface. Portal vein is patent on color Doppler imaging with normal direction of blood flow towards the liver. Other: None. IMPRESSION: 1. Cholelithiasis without sonographic evidence of acute cholecystitis. 2. Fatty liver. Electronically Signed   By: Elgie Collard M.D.   On: 06/20/2023 22:21    Microbiology: Results for orders placed or performed during the hospital encounter of 04/10/22  Resp Panel by RT-PCR (Flu A&B, Covid) Nasopharyngeal Swab     Status: None   Collection Time: 04/11/22  7:39 AM  Specimen: Nasopharyngeal Swab; Nasopharyngeal(NP) swabs in vial transport  medium  Result Value Ref Range Status   SARS Coronavirus 2 by RT PCR NEGATIVE NEGATIVE Final    Comment: (NOTE) SARS-CoV-2 target nucleic acids are NOT DETECTED.  The SARS-CoV-2 RNA is generally detectable in upper respiratory specimens during the acute phase of infection. The lowest concentration of SARS-CoV-2 viral copies this assay can detect is 138 copies/mL. A negative result does not preclude SARS-Cov-2 infection and should not be used as the sole basis for treatment or other patient management decisions. A negative result may occur with  improper specimen collection/handling, submission of specimen other than nasopharyngeal swab, presence of viral mutation(s) within the areas targeted by this assay, and inadequate number of viral copies(<138 copies/mL). A negative result must be combined with clinical observations, patient history, and epidemiological information. The expected result is Negative.  Fact Sheet for Patients:  BloggerCourse.com  Fact Sheet for Healthcare Providers:  SeriousBroker.it  This test is no t yet approved or cleared by the [place] FDA and  has been authorized for detection and/or diagnosis of SARS-CoV-2 by FDA under an Emergency Use Authorization (EUA). This EUA will remain  in effect (meaning this test can be used) for the duration of the COVID-19 declaration under Section 564(b)(1) of the Act, 21 U.S.C.section 360bbb-3(b)(1), unless the authorization is terminated  or revoked sooner.       Influenza A by PCR NEGATIVE NEGATIVE Final   Influenza B by PCR NEGATIVE NEGATIVE Final    Comment: (NOTE) The Xpert Xpress SARS-CoV-2/FLU/RSV plus assay is intended as an aid in the diagnosis of influenza from Nasopharyngeal swab specimens and should not be used as a sole basis for treatment. Nasal washings and aspirates are unacceptable for Xpert Xpress SARS-CoV-2/FLU/RSV testing.  Fact Sheet for  Patients: BloggerCourse.com  Fact Sheet for Healthcare Providers: SeriousBroker.it  This test is not yet approved or cleared by the [place] FDA and has been authorized for detection and/or diagnosis of SARS-CoV-2 by FDA under an Emergency Use Authorization (EUA). This EUA will remain in effect (meaning this test can be used) for the duration of the COVID-19 declaration under Section 564(b)(1) of the Act, 21 U.S.C. section 360bbb-3(b)(1), unless the authorization is terminated or revoked.  Performed at [facility], [address], [place], [place] [address]     Labs: CBC: Recent Labs  Lab 06/20/23 1819 06/21/23 0310 06/22/23 0000 06/23/23 0120 06/24/23 0131  WBC 12.2* 8.5 9.2 8.2 12.2*  NEUTROABS  --  5.7  --   --  10.2*  HGB 14.7 12.9 13.1 14.4 13.1  HCT 45.2 40.4 41.1 46.2* 40.4  MCV 85.3 84.7 84.2 89.4 83.5  PLT 326 251 258 135* 309   Basic Metabolic Panel: Recent Labs  Lab 06/20/23 1819 06/21/23 0310 06/22/23 0000 06/23/23 0120 06/24/23 0131  NA 137 136 135 135 135  K 3.4* 3.3* 3.4* 3.8 3.9  CL 103 99 101 103 103  CO2 22 21* 24 22 23   GLUCOSE 132* 102* 79 91 126*  BUN 5* 6 7 <5* <5*  CREATININE 0.80 0.68 0.69 0.60 0.69  CALCIUM 8.9 8.6* 8.6* 8.7* 8.7*  MG  --  2.0 2.0 2.0  --   PHOS  --   --  3.4 3.8  --    Liver Function Tests: Recent Labs  Lab 06/20/23 1819 06/21/23 0310 06/22/23 0000 06/23/23 0120 06/24/23 0131  AST 369* 210* 79* 33 45*  ALT 459* 338* 237* 162* 130*  ALKPHOS 293* 243* 231* 232* 190*  BILITOT 3.2* 4.1* 1.4* 0.8 0.8  PROT 7.8 6.4* 6.9 7.1 7.2  ALBUMIN 3.7 3.0* 3.2* 3.3* 3.2*   CBG: No results for input(s): "GLUCAP" in the last 168 hours.  Discharge time spent: less than 30 minutes.  Signed: Lurene Shadow, MD [facility] 06/24/2023

## 2023-06-25 ENCOUNTER — Ambulatory Visit: Payer: 59 | Admitting: Obstetrics and Gynecology

## 2023-06-26 LAB — SURGICAL PATHOLOGY

## 2023-06-28 ENCOUNTER — Telehealth: Payer: 59 | Admitting: Nurse Practitioner

## 2023-06-28 DIAGNOSIS — N939 Abnormal uterine and vaginal bleeding, unspecified: Secondary | ICD-10-CM

## 2023-06-28 NOTE — Progress Notes (Signed)
Virtual Visit Consent   BRANDILYN BARTOSIK, you are scheduled for a virtual visit with a [facility] provider today. Just as with appointments in the office, your consent must be obtained to participate. Your consent will be active for this visit and any virtual visit you may have with one of our providers in the next 365 days. If you have a MyChart account, a copy of this consent can be sent to you electronically.  As this is a virtual visit, video technology does not allow for your provider to perform a traditional examination. This may limit your provider's ability to fully assess your condition. If your provider identifies any concerns that need to be evaluated in person or the need to arrange testing (such as labs, EKG, etc.), we will make arrangements to do so. Although advances in technology are sophisticated, we cannot ensure that it will always work on either your end or our end. If the connection with a video visit is poor, the visit may have to be switched to a telephone visit. With either a video or telephone visit, we are not always able to ensure that we have a secure connection.  By engaging in this virtual visit, you consent to the provision of healthcare and authorize for your insurance to be billed (if applicable) for the services provided during this visit. Depending on your insurance coverage, you may receive a charge related to this service.  I need to obtain your verbal consent now. Are you willing to proceed with your visit today? KHARLI TROUNG has provided verbal consent on 06/28/2023 for a virtual visit (telephone). Viviano Simas, FNP  Date: 06/28/2023 9:24 AM  Virtual Visit via Video Note   I, Viviano Simas, connected with  WALKER MEYERSON  ([id number], 08/10/1991) on 06/28/23 at  9:30 AM EDT by a video-enabled telemedicine application and verified that I am speaking with the correct person using two identifiers.  Location: Patient: Virtual Visit Location Patient:  Home Provider: Virtual Visit Location Provider: Home Office   I discussed the limitations of evaluation and management by telemedicine and the availability of in person appointments. The patient expressed understanding and agreed to proceed.    History of Present Illness: Alexandra Gray is a 32 y.o. who identifies as a female who was assigned female at birth, and is  being seen today for noted vaginal discharge (dark brown) noted after wiping.   She was started on birth control 3 weeks ago (Nexplanon)  Her most recent menses was 3 weeks prior to implant    She was recently hospitalized for gallstones, pancreatitis and cholecystitis  S/p cholecystectomy 06/23/23 (Laparoscopic cholecystectomy with intraoperative cholangiogram ) Discharged from hospital 06/24/23 (4 days ago)  Incisions are healing with itching no signs of infection  She has internal sutures   Denies fevers   Of note she has been seen in the past for vaginal spotting (03/26/23) Irregular menses (03/22/23) Bartholin Cyst (03/12/23)   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   PTSD (post-traumatic stress disorder) 02/16/2014  Sleep apnea 02/16/2014   Severe episode of recurrent major depressive disorder, 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, Disp: , Rfl:    metroNIDAZOLE (FLAGYL) 500 MG tablet, Take 500 mg by mouth 2 (two) times daily., Disp: , Rfl:    oxyCODONE (OXY IR/ROXICODONE) 5 MG immediate release tablet, Take 1 tablet (5 mg total) by mouth every 4 (four) hours as needed for moderate pain., Disp: 15 tablet, Rfl: 0  Observations/Objective:  No labored breathing.  Speech is clear and coherent with logical content.  Patient is alert and oriented at baseline.  Unable to connect over video- assessment is over audio only   Assessment and Plan: 1. Vaginal spotting Discussed this is likely her menses starting and is lighter or different based on recent implantation of Nexaplanon.   If bleeding continues or is heavy she should follow up with OBGYN to discuss birth control in more details.   Continue to monitor surgical sites for signs of infection and follow up with redness, drainage, swelling of abdomen or fever. Changes in bowel pattern or other concerns      Follow Up Instructions: I discussed the assessment and treatment plan with the patient. The patient was provided an opportunity to ask questions and all were answered. The patient agreed with the plan and demonstrated an understanding of the instructions.  A copy of instructions were sent to the patient via MyChart unless otherwise noted below.    The patient was advised to call back or seek an in-person evaluation if the symptoms worsen or if the condition fails to improve as anticipated.  Time:  I spent 12 minutes with the patient via  telehealth technology discussing the above problems/concerns.    Viviano Simas, FNP

## 2023-07-03 ENCOUNTER — Inpatient Hospital Stay: Admit: 2023-07-03 | Payer: Self-pay

## 2023-07-18 ENCOUNTER — Telehealth: Payer: 59 | Admitting: Nurse Practitioner

## 2023-07-18 NOTE — Progress Notes (Signed)
No show for Video Visit  

## 2023-09-27 ENCOUNTER — Telehealth: Payer: 59 | Admitting: Physician Assistant

## 2023-09-27 DIAGNOSIS — H9202 Otalgia, left ear: Secondary | ICD-10-CM

## 2023-09-27 DIAGNOSIS — K047 Periapical abscess without sinus: Secondary | ICD-10-CM | POA: Diagnosis not present

## 2023-09-27 MED ORDER — AMOXICILLIN-POT CLAVULANATE 875-125 MG PO TABS: 1.0000 | ORAL_TABLET | Freq: Two times a day (BID) | ORAL | 0 refills | Status: DC

## 2023-09-27 MED ORDER — NAPROXEN 500 MG PO TABS
500.0000 mg | ORAL_TABLET | Freq: Two times a day (BID) | ORAL | 0 refills | Status: DC
Start: 2023-09-27 — End: 2023-12-14

## 2023-09-27 NOTE — Progress Notes (Signed)
Antibiotic prescribed from previous EV today will help this issue.

## 2023-09-27 NOTE — Progress Notes (Signed)
E-Visit for Dental Pain  We are sorry that you are not feeling well.  Here is how we plan to help!  Based on what you have shared with me in the questionnaire, it sounds like you have a dental infection.   Augmentin 875-125mg  twice a day for 7 days and Naprosyn 500mg  2 times a day for 7 days for discomfort  It is imperative that you see a dentist within 10 days of this eVisit to determine the cause of the dental pain and be sure it is adequately treated  A toothache or tooth pain is caused when the nerve in the root of a tooth or surrounding a tooth is irritated. Dental (tooth) infection, decay, injury, or loss of a tooth are the most common causes of dental pain. Pain may also occur after an extraction (tooth is pulled out). Pain sometimes originates from other areas and radiates to the jaw, thus appearing to be tooth pain.Bacteria growing inside your mouth can contribute to gum disease and dental decay, both of which can cause pain. A toothache occurs from inflammation of the central portion of the tooth called pulp. The pulp contains nerve endings that are very sensitive to pain. Inflammation to the pulp or pulpitis may be caused by dental cavities, trauma, and infection.    HOME CARE:   For toothaches: Over-the-counter pain medications such as acetaminophen or ibuprofen may be used. Take these as directed on the package while you arrange for a dental appointment. Avoid very cold or hot foods, because they may make the pain worse. You may get relief from biting on a cotton ball soaked in oil of cloves. You can get oil of cloves at most drug stores.  For jaw pain:  Aspirin may be helpful for problems in the joint of the jaw in adults. If pain happens every time you open your mouth widely, the temporomandibular joint (TMJ) may be the source of the pain. Yawning or taking a large bite of food may worsen the pain. An appointment with your doctor or dentist will help you find the cause.      GET HELP RIGHT AWAY IF:  You have a high fever or chills If you have had a recent head or face injury and develop headache, light headedness, nausea, vomiting, or other symptoms that concern you after an injury to your face or mouth, you could have a more serious injury in addition to your dental injury. A facial rash associated with a toothache: This condition may improve with medication. Contact your doctor for them to decide what is appropriate. Any jaw pain occurring with chest pain: Although jaw pain is most commonly caused by dental disease, it is sometimes referred pain from other areas. People with heart disease, especially people who have had stents placed, people with diabetes, or those who have had heart surgery may have jaw pain as a symptom of heart attack or angina. If your jaw or tooth pain is associated with lightheadedness, sweating, or shortness of breath, you should see a doctor as soon as possible. Trouble swallowing or excessive pain or bleeding from gums: If you have a history of a weakened immune system, diabetes, or steroid use, you may be more susceptible to infections. Infections can often be more severe and extensive or caused by unusual organisms. Dental and gum infections in people with these conditions may require more aggressive treatment. An abscess may need draining or IV antibiotics, for example.  MAKE SURE YOU   Understand these  instructions. Will watch your condition. Will get help right away if you are not doing well or get worse.  Thank you for choosing an e-visit.  Your e-visit answers were reviewed by a board certified advanced clinical practitioner to complete your personal care plan. Depending upon the condition, your plan could have included both over the counter or prescription medications.  Please review your pharmacy choice. Make sure the pharmacy is open so you can pick up prescription now. If there is a problem, you may contact your provider  through [employer] and have the prescription routed to another pharmacy.  Your safety is important to [place]. If you have drug allergies check your prescription carefully.   For the next 24 hours you can use MyChart to ask questions about today's visit, request a non-urgent call back, or ask for a work or school excuse. You will get an email in the next two days asking about your experience. I hope that your e-visit has been valuable and will speed your recovery.  I have spent 5 minutes in review of e-visit questionnaire, review and updating patient chart, medical decision making and response to patient.   [name], PA-C

## 2023-10-05 ENCOUNTER — Telehealth: Payer: 59 | Admitting: Physician Assistant

## 2023-10-05 DIAGNOSIS — R197 Diarrhea, unspecified: Secondary | ICD-10-CM

## 2023-10-06 NOTE — Progress Notes (Signed)
E-Visit for Diarrhea  We are sorry that you are not feeling well.  Here is how we plan to help!  Based on what you have shared with me it looks like you have Acute Infectious Diarrhea.  Most cases of acute diarrhea are due to infections with virus and bacteria and are self-limited conditions lasting less than 14 days.  For your symptoms you may take Imodium 2 mg tablets that are over the counter at your local pharmacy. Take two tablet now and then one after each loose stool up to 6 a day.  Antibiotics are not needed for most people with diarrhea.   HOME CARE We recommend changing your diet to help with your symptoms for the next few days. Drink plenty of fluids that contain water salt and sugar. Sports drinks such as Gatorade may help.  You may try broths, soups, bananas, applesauce, soft breads, mashed potatoes or crackers.  You are considered infectious for as long as the diarrhea continues. Hand washing or use of alcohol based hand sanitizers is recommend. It is best to stay out of work or school until your symptoms stop.   GET HELP RIGHT AWAY If you have dark yellow colored urine or do not pass urine frequently you should drink more fluids.   If your symptoms worsen  If you feel like you are going to pass out (faint) You have a new problem  MAKE SURE YOU  Understand these instructions. Will watch your condition. Will get help right away if you are not doing well or get worse.  Thank you for choosing an e-visit.  Your e-visit answers were reviewed by a board certified advanced clinical practitioner to complete your personal care plan. Depending upon the condition, your plan could have included both over the counter or prescription medications.  Please review your pharmacy choice. Make sure the pharmacy is open so you can pick up prescription now. If there is a problem, you may contact your provider through [employer] and have the prescription routed to another pharmacy.   Your safety is important to [place]. If you have drug allergies check your prescription carefully.   For the next 24 hours you can use MyChart to ask questions about today's visit, request a non-urgent call back, or ask for a work or school excuse. You will get an email in the next two days asking about your experience. I hope that your e-visit has been valuable and will speed your recovery.   I have spent 5 minutes in review of e-visit questionnaire, review and updating patient chart, medical decision making and response to patient.   Gilberto Better, PA-C

## 2023-10-31 ENCOUNTER — Telehealth: Payer: 59 | Admitting: Family Medicine

## 2023-10-31 DIAGNOSIS — K047 Periapical abscess without sinus: Secondary | ICD-10-CM

## 2023-10-31 NOTE — Progress Notes (Signed)
Because you had recent dental infection treated by our group a month ago, your condition warrants further evaluation and I recommend that you be seen in a face to face visit at a local urgent care.   NOTE: There will be NO CHARGE for this eVisit   If you are having a true medical emergency please call 911.

## 2023-11-28 ENCOUNTER — Telehealth: Payer: 59 | Admitting: Physician Assistant

## 2023-11-28 DIAGNOSIS — J208 Acute bronchitis due to other specified organisms: Secondary | ICD-10-CM | POA: Diagnosis not present

## 2023-11-28 MED ORDER — PREDNISONE 20 MG PO TABS
40.0000 mg | ORAL_TABLET | Freq: Every day | ORAL | 0 refills | Status: DC
Start: 2023-11-28 — End: 2024-06-06

## 2023-11-28 MED ORDER — BENZONATATE 100 MG PO CAPS
100.0000 mg | ORAL_CAPSULE | Freq: Three times a day (TID) | ORAL | 0 refills | Status: DC | PRN
Start: 2023-11-28 — End: 2024-02-25

## 2023-11-28 MED ORDER — ALBUTEROL SULFATE HFA 108 (90 BASE) MCG/ACT IN AERS
1.0000 | INHALATION_SPRAY | Freq: Four times a day (QID) | RESPIRATORY_TRACT | 0 refills | Status: DC | PRN
Start: 2023-11-28 — End: 2024-06-06

## 2023-11-28 NOTE — Progress Notes (Signed)
 E-Visit for Cough   We are sorry that you are not feeling well.  Here is how we plan to help!  Based on your presentation I believe you most likely have A cough due to a virus.  This is called viral bronchitis and is best treated by rest, plenty of fluids and control of the cough.  You may use Ibuprofen  or Tylenol  as directed to help your symptoms.     In addition you may use A non-prescription cough medication called Mucinex DM: take 2 tablets every 12 hours. and A prescription cough medication called Tessalon  Perles 100mg . You may take 1-2 capsules every 8 hours as needed for your cough.  I have also prescribed: Prednisone  20mg  Take 2 tablets (40mg ) daily for 5 days.  Albuterol  inhaler Use 1-2 puffs every 6 hours as needed for shortness of breath, chest tightness, and/or wheezing.   From your responses in the eVisit questionnaire you describe inflammation in the upper respiratory tract which is causing a significant cough.  This is commonly called Bronchitis and has four common causes:   Allergies Viral Infections Acid Reflux Bacterial Infection Allergies, viruses and acid reflux are treated by controlling symptoms or eliminating the cause. An example might be a cough caused by taking certain blood pressure medications. You stop the cough by changing the medication. Another example might be a cough caused by acid reflux. Controlling the reflux helps control the cough.  USE OF BRONCHODILATOR (RESCUE) INHALERS: There is a risk from using your bronchodilator too frequently.  The risk is that over-reliance on a medication which only relaxes the muscles surrounding the breathing tubes can reduce the effectiveness of medications prescribed to reduce swelling and congestion of the tubes themselves.  Although you feel brief relief from the bronchodilator inhaler, your asthma may actually be worsening with the tubes becoming more swollen and filled with mucus.  This can delay other crucial  treatments, such as oral steroid medications. If you need to use a bronchodilator inhaler daily, several times per day, you should discuss this with your provider.  There are probably better treatments that could be used to keep your asthma under control.     HOME CARE Only take medications as instructed by your medical team. Complete the entire course of an antibiotic. Drink plenty of fluids and get plenty of rest. Avoid close contacts especially the very young and the elderly Cover your mouth if you cough or cough into your sleeve. Always remember to wash your hands A steam or ultrasonic humidifier can help congestion.   GET HELP RIGHT AWAY IF: You develop worsening fever. You become short of breath You cough up blood. Your symptoms persist after you have completed your treatment plan MAKE SURE YOU  Understand these instructions. Will watch your condition. Will get help right away if you are not doing well or get worse.    Thank you for choosing an e-visit.  Your e-visit answers were reviewed by a board certified advanced clinical practitioner to complete your personal care plan. Depending upon the condition, your plan could have included both over the counter or prescription medications.  Please review your pharmacy choice. Make sure the pharmacy is open so you can pick up prescription now. If there is a problem, you may contact your provider through [employer] and have the prescription routed to another pharmacy.  Your safety is important to us . If you have drug allergies check your prescription carefully.   For the next 24 hours you can  use MyChart to ask questions about today's visit, request a non-urgent call back, or ask for a work or school excuse. You will get an email in the next two days asking about your experience. I hope that your e-visit has been valuable and will speed your recovery.   I have spent 5 minutes in review of e-visit questionnaire, review and  updating patient chart, medical decision making and response to patient.   Delon CHRISTELLA Dickinson, PA-C

## 2023-12-14 ENCOUNTER — Telehealth: Payer: MEDICAID | Admitting: Physician Assistant

## 2023-12-14 DIAGNOSIS — M549 Dorsalgia, unspecified: Secondary | ICD-10-CM | POA: Diagnosis not present

## 2023-12-14 MED ORDER — CYCLOBENZAPRINE HCL 10 MG PO TABS
5.0000 mg | ORAL_TABLET | Freq: Three times a day (TID) | ORAL | 0 refills | Status: DC | PRN
Start: 2023-12-14 — End: 2024-06-12

## 2023-12-14 MED ORDER — NAPROXEN 500 MG PO TABS
500.0000 mg | ORAL_TABLET | Freq: Two times a day (BID) | ORAL | 0 refills | Status: DC
Start: 2023-12-14 — End: 2024-02-17

## 2023-12-14 NOTE — Progress Notes (Signed)
E-Visit for Back Pain   We are sorry that you are not feeling well.  Here is how we plan to help!  Based on what you have shared with me it looks like you mostly have acute back pain.  Acute back pain is defined as musculoskeletal pain that can resolve in 1-3 weeks with conservative treatment.  I have prescribed Naprosyn 500 mg take one by mouth twice a day non-steroid anti-inflammatory (NSAID) as well as Flexeril 10 mg every eight hours as needed which is a muscle relaxer  Some patients experience stomach irritation or in increased heartburn with anti-inflammatory drugs.  Please keep in mind that muscle relaxer's can cause fatigue and should not be taken while at work or driving.  Back pain is very common.  The pain often gets better over time.  The cause of back pain is usually not dangerous.  Most people can learn to manage their back pain on their own.  Home Care Stay active.  Start with short walks on flat ground if you can.  Try to walk farther each day. Do not sit, drive or stand in one place for more than 30 minutes.  Do not stay in bed. Do not avoid exercise or work.  Activity can help your back heal faster. Be careful when you bend or lift an object.  Bend at your knees, keep the object close to you, and do not twist. Sleep on a firm mattress.  Lie on your side, and bend your knees.  If you lie on your back, put a pillow under your knees. Only take medicines as told by your doctor. Put ice on the injured area. Put ice in a plastic bag Place a towel between your skin and the bag Leave the ice on for 15-20 minutes, 3-4 times a day for the first 2-3 days. 210 After that, you can switch between ice and heat packs. Ask your doctor about back exercises or massage. Avoid feeling anxious or stressed.  Find good ways to deal with stress, such as exercise.  Get Help Right Way If: Your pain does not go away with rest or medicine. Your pain does not go away in 1 week. You have new  problems. You do not feel well. The pain spreads into your legs. You cannot control when you poop (bowel movement) or pee (urinate) You feel sick to your stomach (nauseous) or throw up (vomit) You have belly (abdominal) pain. You feel like you may pass out (faint). If you develop a fever.  Make Sure you: Understand these instructions. Will watch your condition Will get help right away if you are not doing well or get worse.  Your e-visit answers were reviewed by a board certified advanced clinical practitioner to complete your personal care plan.  Depending on the condition, your plan could have included both over the counter or prescription medications.  If there is a problem please reply  once you have received a response from your provider.  Your safety is important to [place].  If you have drug allergies check your prescription carefully.    You can use MyChart to ask questions about today's visit, request a non-urgent call back, or ask for a work or school excuse for 24 hours related to this e-Visit. If it has been greater than 24 hours you will need to follow up with your provider, or enter a new e-Visit to address those concerns.  You will get an e-mail in the next two days asking about  your experience.  I hope that your e-visit has been valuable and will speed your recovery. Thank you for using e-visits.   I have spent 5 minutes in review of e-visit questionnaire, review and updating patient chart, medical decision making and response to patient.   [name], PA-C

## 2023-12-31 ENCOUNTER — Encounter: Payer: 59 | Admitting: Obstetrics and Gynecology

## 2024-01-18 ENCOUNTER — Telehealth: Payer: MEDICAID | Admitting: Family Medicine

## 2024-01-18 DIAGNOSIS — N938 Other specified abnormal uterine and vaginal bleeding: Secondary | ICD-10-CM

## 2024-01-18 NOTE — Progress Notes (Signed)
Because [name],  I feel your condition warrants further evaluation and I recommend that you be seen in a face to face visit with your gynecologist or at one of our [facility] clinics.   NOTE: There will be NO CHARGE for this eVisit   If you are having a true medical emergency please call 911.    *[facility]             [address], [place], [place] [address] [phone] (*Take patients with no insurance)  *[facility] for [employer] at [facility] [address] [place], [place],  [place]  [address] [phone] (*Take patients with no insurance)  [facility] for [employer] at [employer]                                                             [address], [address], [place], [place], [address] [phone]  Center for [facility] at [facility] [address], [place], [place], [address] [phone]  Center for [facility] at [facility] [address], [place], [place], [address] [phone]  Center for [facility] at [facility]                                 [address] [place], [place], [place], [address] [phone]  Center for [facility] at [facility] [employer] [facility]                                    [address], [place], [place], [address] [phone]  Center for [facility] at [facility] [address], [address], [place], [place], [address]                              [facility] [address], [place], [place], [address] [phone]  Your MyChart E-visit questionnaire answers were reviewed by a board certified advanced clinical practitioner to complete your personal care plan based on your specific symptoms.  Thank you for using e-Visits.   have provided 5 minutes of non face to face time during this encounter for chart review and documentation.

## 2024-02-05 ENCOUNTER — Emergency Department (HOSPITAL_COMMUNITY)
Admission: EM | Admit: 2024-02-05 | Discharge: 2024-02-05 | Discharge status: Against Medical Advice | Payer: MEDICAID | Attending: Emergency Medicine | Admitting: Emergency Medicine

## 2024-02-05 DIAGNOSIS — Z5321 Procedure and treatment not carried out due to patient leaving prior to being seen by health care provider: Secondary | ICD-10-CM | POA: Insufficient documentation

## 2024-02-05 DIAGNOSIS — N939 Abnormal uterine and vaginal bleeding, unspecified: Secondary | ICD-10-CM | POA: Diagnosis present

## 2024-02-05 NOTE — ED Notes (Signed)
 Pt didn't answer for triage

## 2024-02-17 ENCOUNTER — Telehealth: Payer: MEDICAID | Admitting: Family

## 2024-02-17 DIAGNOSIS — K0889 Other specified disorders of teeth and supporting structures: Secondary | ICD-10-CM | POA: Diagnosis not present

## 2024-02-17 DIAGNOSIS — K047 Periapical abscess without sinus: Secondary | ICD-10-CM | POA: Diagnosis not present

## 2024-02-17 MED ORDER — CLINDAMYCIN HCL 300 MG PO CAPS: 300.0000 mg | ORAL_CAPSULE | Freq: Three times a day (TID) | ORAL | 0 refills | Status: AC

## 2024-02-17 MED ORDER — NAPROXEN 500 MG PO TABS: 500.0000 mg | ORAL_TABLET | Freq: Two times a day (BID) | ORAL | 0 refills | Status: DC

## 2024-02-17 NOTE — Progress Notes (Signed)
 E-Visit for Dental Pain  We are sorry that you are not feeling well.  Here is how we plan to help!  Based on what you have shared with me in the questionnaire, it sounds like you have dental abscess.   Clindamycin 300mg  3 times a day for 7 days and Naprosyn 500mg  2 times a day for 7 days for discomfort  It is imperative that you see a dentist within 10 days of this eVisit to determine the cause of the dental pain and be sure it is adequately treated  A toothache or tooth pain is caused when the nerve in the root of a tooth or surrounding a tooth is irritated. Dental (tooth) infection, decay, injury, or loss of a tooth are the most common causes of dental pain. Pain may also occur after an extraction (tooth is pulled out). Pain sometimes originates from other areas and radiates to the jaw, thus appearing to be tooth pain.Bacteria growing inside your mouth can contribute to gum disease and dental decay, both of which can cause pain. A toothache occurs from inflammation of the central portion of the tooth called pulp. The pulp contains nerve endings that are very sensitive to pain. Inflammation to the pulp or pulpitis may be caused by dental cavities, trauma, and infection.    HOME CARE:   For toothaches: Over-the-counter pain medications such as acetaminophen or ibuprofen may be used. Take these as directed on the package while you arrange for a dental appointment. Avoid very cold or hot foods, because they may make the pain worse. You may get relief from biting on a cotton ball soaked in oil of cloves. You can get oil of cloves at most drug stores.  For jaw pain:  Aspirin may be helpful for problems in the joint of the jaw in adults. If pain happens every time you open your mouth widely, the temporomandibular joint (TMJ) may be the source of the pain. Yawning or taking a large bite of food may worsen the pain. An appointment with your doctor or dentist will help you find the cause.     GET  HELP RIGHT AWAY IF:  You have a high fever or chills If you have had a recent head or face injury and develop headache, light headedness, nausea, vomiting, or other symptoms that concern you after an injury to your face or mouth, you could have a more serious injury in addition to your dental injury. A facial rash associated with a toothache: This condition may improve with medication. Contact your doctor for them to decide what is appropriate. Any jaw pain occurring with chest pain: Although jaw pain is most commonly caused by dental disease, it is sometimes referred pain from other areas. People with heart disease, especially people who have had stents placed, people with diabetes, or those who have had heart surgery may have jaw pain as a symptom of heart attack or angina. If your jaw or tooth pain is associated with lightheadedness, sweating, or shortness of breath, you should see a doctor as soon as possible. Trouble swallowing or excessive pain or bleeding from gums: If you have a history of a weakened immune system, diabetes, or steroid use, you may be more susceptible to infections. Infections can often be more severe and extensive or caused by unusual organisms. Dental and gum infections in people with these conditions may require more aggressive treatment. An abscess may need draining or IV antibiotics, for example.  MAKE SURE YOU   Understand these  instructions. Will watch your condition. Will get help right away if you are not doing well or get worse.  Thank you for choosing an e-visit.  Your e-visit answers were reviewed by a board certified advanced clinical practitioner to complete your personal care plan. Depending upon the condition, your plan could have included both over the counter or prescription medications.  Please review your pharmacy choice. Make sure the pharmacy is open so you can pick up prescription now. If there is a problem, you may contact your provider through  [employer] and have the prescription routed to another pharmacy.  Your safety is important to [place]. If you have drug allergies check your prescription carefully.   For the next 24 hours you can use MyChart to ask questions about today's visit, request a non-urgent call back, or ask for a work or school excuse. You will get an email in the next two days asking about your experience. I hope that your e-visit has been valuable and will speed your recovery.   Approximately 5 minutes was spent documenting and reviewing patient's chart.

## 2024-02-22 ENCOUNTER — Other Ambulatory Visit (INDEPENDENT_AMBULATORY_CARE_PROVIDER_SITE_OTHER): Payer: Self-pay | Admitting: Physician Assistant

## 2024-02-22 DIAGNOSIS — M549 Dorsalgia, unspecified: Secondary | ICD-10-CM

## 2024-02-23 ENCOUNTER — Telehealth: Payer: MEDICAID | Admitting: Physician Assistant

## 2024-02-23 DIAGNOSIS — M549 Dorsalgia, unspecified: Secondary | ICD-10-CM

## 2024-02-23 DIAGNOSIS — M109 Gout, unspecified: Secondary | ICD-10-CM

## 2024-02-23 MED ORDER — COLCHICINE 0.6 MG PO TABS: ORAL_TABLET | ORAL | 0 refills | Status: DC

## 2024-02-23 NOTE — Progress Notes (Signed)
E-Visit for Gout Symptoms  We are sorry that you are not feeling well. We are here to help!  Based on what you shared with me it looks like you have a flare of your gout.  Gout is a form of arthritis. It can cause pain and swelling in the joints. At first, it tends to affect only 1 joint - most frequently the big toe. It happens in people who have too much uric acid in the blood. Uric acid is a chemical that is produced when the body breaks down certain foods. Uric acid can form sharp needle-like crystals that build up in the joints and cause pain. Uric acid crystals can also form inside the tubes that carry urine from the kidneys to the bladder. These crystals can turn into "kidney stones" that can cause pain and problems with the flow of urine. People with gout get sudden "flares" or attacks of severe pain, most often the big toe, ankle, or knee. Often the joint also turns red and swells. Usually, only 1 joint is affected, but some people have pain in more than 1 joint. Gout flares tend to happen more often during the night.  The pain from gout can be extreme. The pain and swelling are worst at the beginning of a gout flare. The symptoms then get better within a few days to weeks. It is not clear how the body "turns off" a gout flare.  Do not start any NEW preventative medicine until the gout has cleared completely. However, If you are already on Probenecid or Allopurinol for CHRONIC gout, you may continue taking this during an active flare up  I have prescribed Colchicine 0.6 mg tabs - Take 2 tabs immediately, then 1 tab twice per day for the duration of the flare up to a max of 7 days (but discontinue for stomach pains or diarrhea)    HOME CARE Losing weight can help relieve gout. It's not clear that following a specific diet plan will help with gout symptoms but eating a balanced diet can help improve your overall health. It can also help you lose weight, if you are overweight. In general, a  healthy diet includes plenty of fruits, vegetables, whole grains, and low-fat dairy products (labelled "low fat", skim, 2%). Avoid sugar sweetened drinks (including sodas, tea, juice and juice blends, coffee drinks and sports drinks) Limit alcohol to 1-2 drinks of beer, spirits or wine daily these can make gout flares worse. Some people with gout also have other health problems, such as heart disease, high blood pressure, kidney disease, or obesity. If you have any of these issues, it's important to work with your doctor to manage them. This can help improve your overall health and might also help with your gout.  GET HELP RIGHT AWAY IF: Your symptoms persist after you have completed your treatment plan You develop severe diarrhea You develop abnormal sensations  You develop vomiting,   You develop weakness  You develop abdominal pain  FOLLOW UP WITH YOUR PRIMARY PROVIDER IF: If your symptoms do not improve within 10 days  MAKE SURE YOU  Understand these instructions. Will watch your condition. Will get help right away if you are not doing well or get worse.  Thank you for choosing an e-visit.  Your e-visit answers were reviewed by a board certified advanced clinical practitioner to complete your personal care plan. Depending upon the condition, your plan could have included both over the counter or prescription medications.  Please review your  pharmacy choice. Make sure the pharmacy is open so you can pick up prescription now. If there is a problem, you may contact your provider through [employer] and have the prescription routed to another pharmacy.  Your safety is important to [place]. If you have drug allergies check your prescription carefully.   For the next 24 hours you can use MyChart to ask questions about today's visit, request a non-urgent call back, or ask for a work or school excuse. You will get an email in the next two days asking about your experience. I hope that your  e-visit has been valuable and will speed your recovery.   I have spent 5 minutes in review of e-visit questionnaire, review and updating patient chart, medical decision making and response to patient.   Loraine Grip Mayers, PA-C

## 2024-02-23 NOTE — Progress Notes (Signed)
  Because of the severity of your pain and cause of pain, I feel your condition warrants further evaluation and I recommend that you be seen in a face-to-face visit.   NOTE: There will be NO CHARGE for this E-Visit   If you are having a true medical emergency, please call 911.     For an urgent face to face visit, [facility] has multiple urgent care centers for your convenience.  Click the link below for the full list of locations and hours, walk-in wait times, appointment scheduling options and driving directions:  Urgent Care - [place], [place], [place], [place], [place], [place]  [place]     Your MyChart E-visit questionnaire answers were reviewed by a board certified advanced clinical practitioner to complete your personal care plan based on your specific symptoms.    Thank you for using e-Visits.   I have spent 5 minutes in review of e-visit questionnaire, review and updating patient chart, medical decision making and response to patient.   Kasandra Knudsen Mayers, PA-C

## 2024-02-24 ENCOUNTER — Telehealth: Payer: MEDICAID | Admitting: Physician Assistant

## 2024-02-24 DIAGNOSIS — J069 Acute upper respiratory infection, unspecified: Secondary | ICD-10-CM | POA: Diagnosis not present

## 2024-02-25 MED ORDER — FLUTICASONE PROPIONATE 50 MCG/ACT NA SUSP: 2.0000 | Freq: Every day | NASAL | 0 refills | Status: DC

## 2024-02-25 MED ORDER — BENZONATATE 100 MG PO CAPS
100.0000 mg | ORAL_CAPSULE | Freq: Three times a day (TID) | ORAL | 0 refills | Status: DC | PRN
Start: 2024-02-25 — End: 2024-06-06

## 2024-02-25 NOTE — Progress Notes (Signed)

## 2024-03-11 ENCOUNTER — Telehealth: Payer: MEDICAID | Admitting: Physician Assistant

## 2024-03-11 DIAGNOSIS — N939 Abnormal uterine and vaginal bleeding, unspecified: Secondary | ICD-10-CM

## 2024-03-12 NOTE — Progress Notes (Signed)
  Because of breakthrough bleeding and cramping despite your nexplanon, I feel your condition warrants further evaluation and I recommend that you be seen in a face-to-face visit. I would first contact the provider who is managing your nexplanon.    NOTE: There will be NO CHARGE for this E-Visit   If you are having a true medical emergency, please call 911.     For an urgent face to face visit, [facility] has multiple urgent care centers for your convenience.  Click the link below for the full list of locations and hours, walk-in wait times, appointment scheduling options and driving directions:  Urgent Care - [place], [place], [place], [place], [place], [place]  [place]     Your MyChart E-visit questionnaire answers were reviewed by a board certified advanced clinical practitioner to complete your personal care plan based on your specific symptoms.    Thank you for using e-Visits.

## 2024-03-24 ENCOUNTER — Telehealth: Payer: MEDICAID | Admitting: Physician Assistant

## 2024-03-24 DIAGNOSIS — N938 Other specified abnormal uterine and vaginal bleeding: Secondary | ICD-10-CM

## 2024-03-24 NOTE — Progress Notes (Signed)
 Virtual Visit Consent   Alexandra Gray, you are scheduled for a virtual visit with a [facility] provider today. Just as with appointments in the office, your consent must be obtained to participate. Your consent will be active for this visit and any virtual visit you may have with one of our providers in the next 365 days. If you have a MyChart account, a copy of this consent can be sent to you electronically.  As this is a virtual visit, video technology does not allow for your provider to perform a traditional examination. This may limit your provider's ability to fully assess your condition. If your provider identifies any concerns that need to be evaluated in person or the need to arrange testing (such as labs, EKG, etc.), we will make arrangements to do so. Although advances in technology are sophisticated, we cannot ensure that it will always work on either your end or our end. If the connection with a video visit is poor, the visit may have to be switched to a telephone visit. With either a video or telephone visit, we are not always able to ensure that we have a secure connection.  By engaging in this virtual visit, you consent to the provision of healthcare and authorize for your insurance to be billed (if applicable) for the services provided during this visit. Depending on your insurance coverage, you may receive a charge related to this service.  I need to obtain your verbal consent now. Are you willing to proceed with your visit today? Alexandra Gray has provided verbal consent on 03/24/2024 for a virtual visit (video or telephone). Angelia Kelp, PA-C  Date: 03/24/2024 1:04 PM   Virtual Visit via Video Note   I, Angelia Kelp, connected with  Alexandra Gray  ([id number], 1990-12-19) on 03/24/24 at 12:45 PM EDT by a video-enabled telemedicine application and verified that I am speaking with the correct person using two identifiers.  Location: Patient: Virtual  Visit Location Patient: Home Provider: Virtual Visit Location Provider: Home Office   I discussed the limitations of evaluation and management by telemedicine and the availability of in person appointments. The patient expressed understanding and agreed to proceed.    History of Present Illness: Alexandra Gray is a 33 y.o. who identifies as a female who was assigned female at birth, and is being seen today for irregular menstrual cycles. Had Nexplanon  inserted in the fall of last year. Has been having intermittent irregular bleeding, but over the last month it has worsened. Having vaginal bleeding lasting a week or so and recurring once a week. Reports heavy bleeding, using 2 pads and still bleeding through. Feeling fatigued and worn down. Does report a h/o ovarian cyst and uterine fibroids.   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   PTSD (post-traumatic stress disorder) 02/16/2014   Sleep apnea 02/16/2014   Severe episode of recurrent major depressive disorder, 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    Allergies:  Allergies  Allergen Reactions   Infuvite Adult [Multiple Vitamin]  Shortness Of Breath   Nubain  [Nalbuphine  Hcl] Other (See Comments)    Makes patient hot   Medications:  Current Outpatient Medications:    acetaminophen  (TYLENOL ) 500 MG tablet, Take 2 tablets (1,000 mg total) by mouth every 6 (six) hours as needed., Disp: , Rfl:    albuterol  (VENTOLIN  HFA) 108  (90 Base) MCG/ACT inhaler, Inhale 1-2 puffs into the lungs every 6 (six) hours as needed., Disp: 8 g, Rfl: 0   benzonatate  (TESSALON ) 100 MG capsule, Take 1 capsule (100 mg total) by mouth 3 (three) times daily as needed., Disp: 30 capsule, Rfl: 0   colchicine  0.6 MG tablet, Take 2 tabs immediately, then 1 tab twice per day for the duration of the flare up to a max of 7 days, Disp: 16 tablet, Rfl: 0   cyclobenzaprine  (FLEXERIL ) 10 MG tablet, Take 0.5-1 tablets (5-10 mg total) by mouth 3 (three) times daily as needed., Disp: 30 tablet, Rfl: 0   fluticasone  (FLONASE ) 50 MCG/ACT nasal spray, Place 2 sprays into both nostrils daily., Disp: 16 g, Rfl: 0   naproxen  (NAPROSYN ) 500 MG tablet, Take 1 tablet (500 mg total) by mouth 2 (two) times daily with a meal., Disp: 60 tablet, Rfl: 0   oxyCODONE  (OXY IR/ROXICODONE ) 5 MG immediate release tablet, Take 1 tablet (5 mg total) by mouth every 4 (four) hours as needed for moderate pain., Disp: 15 tablet, Rfl: 0   predniSONE  (DELTASONE ) 20 MG tablet, Take 2 tablets (40 mg total) by mouth daily with breakfast., Disp: 10 tablet, Rfl: 0  Observations/Objective: Patient is well-developed, well-nourished in no acute distress.  Resting comfortably at home.  Head is normocephalic, atraumatic.  No labored breathing.  Speech is clear and coherent with logical content.  Patient is alert and oriented at baseline.    Assessment and Plan: 1. Dysfunctional uterine bleeding (Primary)  - Advised due to heavy bleeding and having mild symptoms of anemia it may be best if she can be seen at a local Urgent Care facility or by her PCP office for labs to check for anemia and to rule out other causes of abnormal vaginal bleeding vs this being a side effect of Nexplanon .  Recommendation to be seen in-person at Primary Care Office or Urgent Care facility in next 1-2 days, or local ER facility immediately if symptoms acutely worsen  Follow Up Instructions: I discussed the  assessment and treatment plan with the patient. The patient was provided an opportunity to ask questions and all were answered. The patient agreed with the plan and demonstrated an understanding of the instructions.  A copy of instructions were sent to the patient via MyChart unless otherwise noted below.     The patient was advised to call back or seek an in-person evaluation if the symptoms worsen or if the condition fails to improve as anticipated.    Angelia Kelp, PA-C

## 2024-03-24 NOTE — Patient Instructions (Signed)
 Alexandra Gray, thank you for joining Angelia Kelp, PA-C for today's virtual visit.  While this provider is not your primary care provider (PCP), if your PCP is located in our provider database this encounter information will be shared with them immediately following your visit.   A [facility] MyChart account gives you access to today's visit and all your visits, tests, and labs performed at [facility] " click here if you don't have a [facility] MyChart account or go to [email or web address]  Consent: (Patient) Alexandra Gray provided verbal consent for this virtual visit at the beginning of the encounter.  Current Medications:  Current Outpatient Medications:    acetaminophen  (TYLENOL ) 500 MG tablet, Take 2 tablets (1,000 mg total) by mouth every 6 (six) hours as needed., Disp: , Rfl:    albuterol  (VENTOLIN  HFA) 108 (90 Base) MCG/ACT inhaler, Inhale 1-2 puffs into the lungs every 6 (six) hours as needed., Disp: 8 g, Rfl: 0   benzonatate  (TESSALON ) 100 MG capsule, Take 1 capsule (100 mg total) by mouth 3 (three) times daily as needed., Disp: 30 capsule, Rfl: 0   colchicine  0.6 MG tablet, Take 2 tabs immediately, then 1 tab twice per day for the duration of the flare up to a max of 7 days, Disp: 16 tablet, Rfl: 0   cyclobenzaprine  (FLEXERIL ) 10 MG tablet, Take 0.5-1 tablets (5-10 mg total) by mouth 3 (three) times daily as needed., Disp: 30 tablet, Rfl: 0   fluticasone  (FLONASE ) 50 MCG/ACT nasal spray, Place 2 sprays into both nostrils daily., Disp: 16 g, Rfl: 0   naproxen  (NAPROSYN ) 500 MG tablet, Take 1 tablet (500 mg total) by mouth 2 (two) times daily with a meal., Disp: 60 tablet, Rfl: 0   oxyCODONE  (OXY IR/ROXICODONE ) 5 MG immediate release tablet, Take 1 tablet (5 mg total) by mouth every 4 (four) hours as needed for moderate pain., Disp: 15 tablet, Rfl: 0   predniSONE  (DELTASONE ) 20 MG tablet, Take 2 tablets (40 mg total) by mouth daily with breakfast.,  Disp: 10 tablet, Rfl: 0   Medications ordered in this encounter:  No orders of the defined types were placed in this encounter.    *If you need refills on other medications prior to your next appointment, please contact your pharmacy*  Follow-Up: Call back or seek an in-person evaluation if the symptoms worsen or if the condition fails to improve as anticipated.  [facility] [phone]  Other Instructions  Abnormal Uterine Bleeding  Abnormal uterine bleeding is unusual bleeding from the uterus. It includes bleeding after sex, or bleeding or spotting between menstrual periods. It may also include bleeding that is heavier than normal, menstrual periods that last longer than usual, or bleeding that occurs after menopause. Abnormal uterine bleeding can affect teenagers, women in their reproductive years, pregnant women, and women who have reached menopause. Common causes of abnormal uterine bleeding include: Pregnancy. Abnormal growths within the lining of the uterus (polyps). Benign tumors or growths in the uterus (fibroids). These are not cancer. Infection. Cancer. Too much or too little of some hormones in the body (hormonal imbalances). Any type of abnormal bleeding should be checked by a health care provider. Many cases are minor and simple to treat, but others may be more serious. Treatment will depend on the cause of the bleeding and how severe it is. Follow these instructions at home: Medicines Take over-the-counter and prescription medicines only as told by your health care provider. Ask your health  care provider about: Taking medicines such as aspirin  and ibuprofen . These medicines can thin your blood. Do not take these medicines unless your health care provider tells you to take them. Taking over-the-counter medicines, vitamins, herbs, and supplements. If you were prescribed iron pills, take them as told by your health care provider. Iron pills help to replace  iron that your body loses because of this condition. Managing constipation In cases of severe bleeding, you may be asked to increase your iron intake to treat anemia. Doing this may cause constipation. To prevent or treat constipation, you may need to: Drink enough fluid to keep your urine pale yellow. Take over-the-counter or prescription medicines. Eat foods that are high in fiber, such as beans, whole grains, and fresh fruits and vegetables. Limit foods that are high in fat and processed sugars, such as fried or sweet foods. Activity Alter your activity to decrease bleeding if you need to change your sanitary pad more than one time every 2 hours: Lie in bed with your feet raised (elevated). Place a cold pack on your lower abdomen. Rest as much as possible until the bleeding stops or slows down. General instructions Do not use tampons, douche, or have sex until your health care provider says these things are okay. Change your sanitary pads often. Get regular exams. These include pelvic exams and cervical cancer screenings. It is up to you to get the results of any tests that are done. Ask your health care provider, or the department that is doing the tests, when your results will be ready. Monitor your condition for any changes. For 2 months, write down: When your menstrual period starts. When your menstrual period ends. When any abnormal vaginal bleeding occurs. What problems you notice. Keep all follow-up visits. This is important. Contact a health care provider if: You have bleeding that lasts for more than one week. You feel dizzy at times. You feel nauseous or you vomit. You feel light-headed or weak. You notice any other changes that show that your condition is getting worse. Get help right away if: You faint. You have bleeding that soaks through a sanitary pad every hour. You have pain in the abdomen. You have a fever or chills. You become sweaty or weak. You pass large  blood clots from your vagina. These symptoms may represent a serious problem that is an emergency. Do not wait to see if the symptoms will go away. Get medical help right away. Call your local emergency services (911 in the [place] Do not drive yourself to the hospital. Summary Abnormal uterine bleeding is unusual bleeding from the uterus. Any type of abnormal bleeding should be checked by a health care provider. Many cases are minor and simple to treat, but others may be more serious. Treatment will depend on the cause of the bleeding and how severe it is. Get help right away if you faint, you have bleeding that soaks through a sanitary pad every hour, or you pass large blood clots from your vagina. This information is not intended to replace advice given to you by your health care provider. Make sure you discuss any questions you have with your health care provider. Document Revised: 06/18/2023 Document Reviewed: 03/15/2021 Elsevier Patient Education  2024 Elsevier Inc.   If you have been instructed to have an in-person evaluation today at a local Urgent Care facility, please use the link below. It will take you to a list of all of our available [facility], including address,  phone number and hours of operation. Please do not delay care.  [facility]  If you or a family member do not have a primary care provider, use the link below to schedule a visit and establish care. When you choose a [facility] primary care physician or advanced practice provider, you gain a long-term partner in health. Find a Primary Care Provider  Learn more about [facility] in-office and virtual care options:  - Get Care Now

## 2024-03-27 ENCOUNTER — Telehealth: Payer: MEDICAID | Admitting: Physician Assistant

## 2024-03-27 DIAGNOSIS — M546 Pain in thoracic spine: Secondary | ICD-10-CM

## 2024-03-27 DIAGNOSIS — R1013 Epigastric pain: Secondary | ICD-10-CM

## 2024-03-27 NOTE — Progress Notes (Signed)
  Because of severity of pain and concern of pancreatitis, I feel your condition warrants further evaluation and I recommend that you be seen in a face-to-face visit. I would recommend being seen at nearest ER for 10/10 pain described.    NOTE: There will be NO CHARGE for this E-Visit   If you are having a true medical emergency, please call 911.     For an urgent face to face visit, [facility] has multiple urgent care centers for your convenience.  Click the link below for the full list of locations and hours, walk-in wait times, appointment scheduling options and driving directions:  Urgent Care - [place], [place], [place], [place], [place], [place]  [place]     Your MyChart E-visit questionnaire answers were reviewed by a board certified advanced clinical practitioner to complete your personal care plan based on your specific symptoms.    Thank you for using e-Visits.

## 2024-03-30 ENCOUNTER — Telehealth: Payer: MEDICAID | Admitting: Family Medicine

## 2024-03-30 DIAGNOSIS — N938 Other specified abnormal uterine and vaginal bleeding: Secondary | ICD-10-CM

## 2024-03-30 NOTE — Progress Notes (Signed)
 Because [name],  I feel your condition warrants further evaluation and I recommend that you be seen in a face to face visit with your gynecologist or at one of our [facility] clinics.   NOTE: There will be NO CHARGE for this eVisit   If you are having a true medical emergency please call 911.    *Center for [facility] [facility]             [address], [place], [place] [address] [phone] (*Take patients with no insurance)  *Center for [employer] at [employer] [facility] [address] [place], [place],  [place]  [address] [phone] (*Take patients with no insurance)  Center for [employer] at [employer]                                                             [address], [address], [place], [place], [address] [phone]  Center for [facility] at [facility] [address], [place], [place], [address] [phone]  Center for [facility] at [facility] [address], [place], [place], [address] [phone]  Center for [facility] at [facility]                                 [address] [place], [place], [place], [address] [phone]  Center for [facility] at [employer]                                    [address], [place], [place], [address] [phone]  Center for [facility] at [facility] [address], [place], [place], [address]                              [facility] [address], [place], [place], [address] [phone]  Your MyChart E-visit questionnaire answers were reviewed by a board certified advanced clinical practitioner to complete your personal care plan based on your specific symptoms.  Thank you for using e-Visits.   have provided 5 minutes of non face to face time during this encounter for chart review and documentation.

## 2024-03-31 ENCOUNTER — Telehealth: Payer: MEDICAID

## 2024-03-31 ENCOUNTER — Telehealth: Payer: MEDICAID | Admitting: Physician Assistant

## 2024-03-31 DIAGNOSIS — N938 Other specified abnormal uterine and vaginal bleeding: Secondary | ICD-10-CM | POA: Diagnosis not present

## 2024-03-31 MED ORDER — MEDROXYPROGESTERONE ACETATE 5 MG PO TABS
5.0000 mg | ORAL_TABLET | Freq: Every day | ORAL | 0 refills | Status: DC
Start: 2024-03-31 — End: 2024-04-07

## 2024-03-31 NOTE — Progress Notes (Signed)
 Virtual Visit Consent   Alexandra Gray, you are scheduled for a virtual visit with a [facility] provider today. Just as with appointments in the office, your consent must be obtained to participate. Your consent will be active for this visit and any virtual visit you may have with one of our providers in the next 365 days. If you have a MyChart account, a copy of this consent can be sent to you electronically.  As this is a virtual visit, video technology does not allow for your provider to perform a traditional examination. This may limit your provider's ability to fully assess your condition. If your provider identifies any concerns that need to be evaluated in person or the need to arrange testing (such as labs, EKG, etc.), we will make arrangements to do so. Although advances in technology are sophisticated, we cannot ensure that it will always work on either your end or our end. If the connection with a video visit is poor, the visit may have to be switched to a telephone visit. With either a video or telephone visit, we are not always able to ensure that we have a secure connection.  By engaging in this virtual visit, you consent to the provision of healthcare and authorize for your insurance to be billed (if applicable) for the services provided during this visit. Depending on your insurance coverage, you may receive a charge related to this service.  I need to obtain your verbal consent now. Are you willing to proceed with your visit today? Alexandra Gray has provided verbal consent on 03/31/2024 for a virtual visit (video or telephone). Angelia Kelp, PA-C  Date: 03/31/2024 5:03 PM   Virtual Visit via Video Note   I, Angelia Kelp, connected with  Alexandra Gray  ([id number], 09-14-1991) on 03/31/24 at  4:30 PM EDT by a video-enabled telemedicine application and verified that I am speaking with the correct person using two identifiers.  Location: Patient: Virtual Visit  Location Patient: Home Provider: Virtual Visit Location Provider: Home Office   I discussed the limitations of evaluation and management by telemedicine and the availability of in person appointments. The patient expressed understanding and agreed to proceed.    History of Present Illness: Alexandra Gray is a 33 y.o. who identifies as a female who was assigned female at birth, and is being seen today for continued breakthrough bleeding with Nexplanon , or abnormal uterine bleeding. Patient has been seen on 03/11/24, 03/24/24, 03/27/24, and 03/30/24, all virtually (E-visit and Video Visit) for this issue. She has been told each time she needs to be seen in-person for this issue as she has reported heavy bleeding and some signs of anemia (fatigue). Now reports bleeding is not as heavy but she feels "swollen over the area in my stomach where my uterus is". She reports her OB/GYN will not see her until June due to lack of availability. She has declined being seen in person as directed multiple times.    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  PTSD (post-traumatic stress disorder) 02/16/2014   Sleep apnea 02/16/2014   Severe episode of recurrent major depressive disorder, 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    Allergies:  Allergies  Allergen Reactions   Infuvite Adult [Multiple Vitamin] Shortness Of Breath   Nubain  [Nalbuphine  Hcl] Other (See Comments)    Makes patient hot    Medications:  Current Outpatient Medications:    medroxyPROGESTERone  (PROVERA ) 5 MG tablet, Take 1 tablet (5 mg total) by mouth daily., Disp: 5 tablet, Rfl: 0   acetaminophen  (TYLENOL ) 500 MG tablet, Take 2 tablets (1,000 mg total) by mouth every 6 (six) hours as needed., Disp: , Rfl:    albuterol  (VENTOLIN  HFA) 108 (90 Base) MCG/ACT inhaler, Inhale 1-2 puffs into the lungs every 6 (six) hours as needed., Disp: 8 g, Rfl: 0   benzonatate  (TESSALON ) 100 MG capsule, Take 1 capsule (100 mg total) by mouth 3 (three) times daily as needed., Disp: 30 capsule, Rfl: 0   colchicine  0.6 MG tablet, Take 2 tabs immediately, then 1 tab twice per day for the duration of the flare up to a max of 7 days, Disp: 16 tablet, Rfl: 0   cyclobenzaprine  (FLEXERIL ) 10 MG tablet, Take 0.5-1 tablets (5-10 mg total) by mouth 3 (three) times daily as needed., Disp: 30 tablet, Rfl: 0   fluticasone  (FLONASE ) 50 MCG/ACT nasal spray, Place 2 sprays into both nostrils daily., Disp: 16 g, Rfl: 0   naproxen  (NAPROSYN ) 500 MG tablet, Take 1 tablet (500 mg total) by mouth 2 (two) times daily with a meal., Disp: 60 tablet, Rfl: 0   oxyCODONE  (OXY IR/ROXICODONE ) 5 MG immediate release tablet, Take 1 tablet (5 mg total) by mouth every 4 (four) hours as needed for moderate pain., Disp: 15 tablet, Rfl: 0   predniSONE  (DELTASONE ) 20 MG tablet, Take 2 tablets (40 mg total) by mouth daily with breakfast., Disp: 10 tablet, Rfl: 0  Observations/Objective: Patient is well-developed, well-nourished in no acute distress.  Resting comfortably at home.  Head is normocephalic, atraumatic.  No labored breathing. Speech is clear and coherent with logical content.  Patient is alert and oriented at baseline.    Assessment and Plan: 1. Dysfunctional uterine bleeding (Primary) - medroxyPROGESTERone  (PROVERA ) 5 MG tablet; Take 1 tablet (5 mg total) by mouth daily.  Dispense: 5 tablet; Refill: 0  - Patient has declined to be seen in person at a  local Urgent Care as directed multiple times for further evaluation and her OB/GYN is unable to see her in a timely manner - She continues to have bleeding that has been present almost a month now - Discussed that we cannot determine source of bleeding. It could be from the Nexplanon , which is her concern, but could be from a uterine fibroid, an ovarian cyst, a bleeding polyp, an ectopic pregnancy; all are possibilities for which take in person evaluations - She states she cannot go and wants a medication to try to stop the bleeding - Will trial Provera  as noted above at a low dose. If bleeding does not subside she needs to be seen in person to determine the source  Follow Up Instructions: I discussed the assessment and treatment plan with the patient. The patient was provided an opportunity to ask questions and all were answered. The patient agreed with the plan and demonstrated an understanding of the instructions.  A copy of instructions were sent to the patient via MyChart unless otherwise noted below.    The patient was advised to call back  or seek an in-person evaluation if the symptoms worsen or if the condition fails to improve as anticipated.    Angelia Kelp, PA-C

## 2024-03-31 NOTE — Patient Instructions (Signed)
 Alexandra Gray, thank you for joining Angelia Kelp, PA-C for today's virtual visit.  While this provider is not your primary care provider (PCP), if your PCP is located in our provider database this encounter information will be shared with them immediately following your visit.   A [facility] MyChart account gives you access to today's visit and all your visits, tests, and labs performed at [facility] " click here if you don't have a [facility] MyChart account or go to [email or web address]  Consent: (Patient) Alexandra Gray provided verbal consent for this virtual visit at the beginning of the encounter.  Current Medications:  Current Outpatient Medications:    medroxyPROGESTERone  (PROVERA ) 5 MG tablet, Take 1 tablet (5 mg total) by mouth daily., Disp: 5 tablet, Rfl: 0   acetaminophen  (TYLENOL ) 500 MG tablet, Take 2 tablets (1,000 mg total) by mouth every 6 (six) hours as needed., Disp: , Rfl:    albuterol  (VENTOLIN  HFA) 108 (90 Base) MCG/ACT inhaler, Inhale 1-2 puffs into the lungs every 6 (six) hours as needed., Disp: 8 g, Rfl: 0   benzonatate  (TESSALON ) 100 MG capsule, Take 1 capsule (100 mg total) by mouth 3 (three) times daily as needed., Disp: 30 capsule, Rfl: 0   colchicine  0.6 MG tablet, Take 2 tabs immediately, then 1 tab twice per day for the duration of the flare up to a max of 7 days, Disp: 16 tablet, Rfl: 0   cyclobenzaprine  (FLEXERIL ) 10 MG tablet, Take 0.5-1 tablets (5-10 mg total) by mouth 3 (three) times daily as needed., Disp: 30 tablet, Rfl: 0   fluticasone  (FLONASE ) 50 MCG/ACT nasal spray, Place 2 sprays into both nostrils daily., Disp: 16 g, Rfl: 0   naproxen  (NAPROSYN ) 500 MG tablet, Take 1 tablet (500 mg total) by mouth 2 (two) times daily with a meal., Disp: 60 tablet, Rfl: 0   oxyCODONE  (OXY IR/ROXICODONE ) 5 MG immediate release tablet, Take 1 tablet (5 mg total) by mouth every 4 (four) hours as needed for moderate pain., Disp: 15  tablet, Rfl: 0   predniSONE  (DELTASONE ) 20 MG tablet, Take 2 tablets (40 mg total) by mouth daily with breakfast., Disp: 10 tablet, Rfl: 0   Medications ordered in this encounter:  Meds ordered this encounter  Medications   medroxyPROGESTERone  (PROVERA ) 5 MG tablet    Sig: Take 1 tablet (5 mg total) by mouth daily.    Dispense:  5 tablet    Refill:  0    Supervising Provider:   Corine Dice [[id number]]     *If you need refills on other medications prior to your next appointment, please contact your pharmacy*  Follow-Up: Call back or seek an in-person evaluation if the symptoms worsen or if the condition fails to improve as anticipated.  [facility] [phone]  Other Instructions Medroxyprogesterone  Tablets What is this medication? MEDROXYPROGESTERONE  (me DROX ee proe JES te rone) treats irregular menstrual cycles. It may also be used to prevent the lining of the uterus from becoming too thick in people taking estrogen after menopause. It works by increasing levels of the hormone progestin in the body. This medication is a progestin hormone. This medicine may be used for other purposes; ask your health care provider or pharmacist if you have questions. COMMON BRAND NAME(S): Amen , Provera  What should I tell my care team before I take this medication? They need to know if you have any of these conditions: Blood vessel disease or a history of a blood  clot in the lungs or legs Breast, cervical, or vaginal cancer Heart disease Kidney disease Liver disease Migraine Recent miscarriage or abortion Mental depression Seizures Stroke Vaginal bleeding that has not been evaluated An unusual or allergic reaction to medroxyprogesterone , other medications, foods, dyes, or preservatives Pregnant or trying to get pregnant Breast-feeding How should I use this medication? Take this medication by mouth with a glass of water. Follow the directions on the prescription label.  Take your doses at regular intervals. Do not take your medication more often than directed. Talk to your care team about the use of this medication in children. Special care may be needed. While this medication may be prescribed for children as young as 13 years for selected conditions, precautions do apply. Overdosage: If you think you have taken too much of this medicine contact a poison control center or emergency room at once. NOTE: This medicine is only for you. Do not share this medicine with others. What if I miss a dose? If you miss a dose, take it as soon as you can. If it is almost time for your next dose, take only that dose. Do not take double or extra doses. What may interact with this medication? Barbiturate medications for inducing sleep or treating seizures Bosentan Carbamazepine Phenytoin Rifampin St. John's Wort This list may not describe all possible interactions. Give your health care provider a list of all the medicines, herbs, non-prescription drugs, or dietary supplements you use. Also tell them if you smoke, drink alcohol, or use illegal drugs. Some items may interact with your medicine. What should I watch for while using this medication? Visit your care team for regular checks on your progress. You will need a regular breast and pelvic exam. If you have any reason to think you are pregnant, stop taking this medication at once and contact your care team. What side effects may I notice from receiving this medication? Side effects that you should report to your care team as soon as possible: Allergic reactions--skin rash, itching, hives, swelling of the face, lips, tongue, or throat Blood clot--pain, swelling, or warmth in the leg, shortness of breath, chest pain Breast tissue changes, new lumps, redness, pain, or discharge from the nipple Gallbladder problems--severe stomach pain, nausea, vomiting, fever Increase in blood pressure Liver injury--right upper belly pain,  loss of appetite, nausea, light-colored stool, dark yellow or brown urine, yellowing skin or eyes, unusual weakness or fatigue Stroke--sudden numbness or weakness of the face, arm, or leg, trouble speaking, confusion, trouble walking, loss of balance or coordination, dizziness, severe headache, change in vision Sudden eye pain or change in vision such as blurry vision, seeing halos around lights, vision loss Unusual vaginal discharge, itching, or odor Vaginal bleeding after menopause, pelvic pain Worsening mood, feelings of depression Side effects that usually do not require medical attention (report to your care team if they continue or are bothersome): Breast pain or tenderness Change in sex drive or performance Headache Irregular menstrual cycles or spotting Nausea Weight gain This list may not describe all possible side effects. Call your doctor for medical advice about side effects. You may report side effects to FDA at 1-800-FDA-1088. Where should I keep my medication? Keep out of the reach of children and pets. Store at room temperature between 20 and 25 degrees C (68 and 77 degrees F). Throw away any unused medication after the expiration date. NOTE: This sheet is a summary. It may not cover all possible information. If you have questions about  this medicine, talk to your doctor, pharmacist, or health care provider.  2024 Elsevier/Gold Standard (2021-01-16 00:00:00)   If you have been instructed to have an in-person evaluation today at a local Urgent Care facility, please use the link below. It will take you to a list of all of our available [facility], including address, phone number and hours of operation. Please do not delay care.  [facility]  If you or a family member do not have a primary care provider, use the link below to schedule a visit and establish care. When you choose a [facility] primary care physician or advanced practice provider, you  gain a long-term partner in health. Find a Primary Care Provider  Learn more about 's in-office and virtual care options:  - Get Care Now

## 2024-04-07 ENCOUNTER — Emergency Department (HOSPITAL_COMMUNITY): Payer: MEDICAID

## 2024-04-07 ENCOUNTER — Emergency Department (HOSPITAL_COMMUNITY)
Admission: EM | Admit: 2024-04-07 | Discharge: 2024-04-07 | Disposition: A | Discharge status: Home / Self Care | Payer: MEDICAID | Attending: Emergency Medicine | Admitting: Emergency Medicine

## 2024-04-07 ENCOUNTER — Other Ambulatory Visit: Payer: Self-pay

## 2024-04-07 DIAGNOSIS — E876 Hypokalemia: Secondary | ICD-10-CM | POA: Diagnosis not present

## 2024-04-07 DIAGNOSIS — N939 Abnormal uterine and vaginal bleeding, unspecified: Secondary | ICD-10-CM | POA: Insufficient documentation

## 2024-04-07 LAB — CBC WITH DIFFERENTIAL/PLATELET
Abs Immature Granulocytes: 0.04 10*3/uL (ref 0.00–0.07)
Basophils Absolute: 0 10*3/uL (ref 0.0–0.1)
Basophils Relative: 0 %
Eosinophils Absolute: 0.3 10*3/uL (ref 0.0–0.5)
Eosinophils Relative: 4 %
HCT: 42.5 % (ref 36.0–46.0)
Hemoglobin: 13.9 g/dL (ref 12.0–15.0)
Immature Granulocytes: 1 %
Lymphocytes Relative: 30 %
Lymphs Abs: 2.4 10*3/uL (ref 0.7–4.0)
MCH: 28.2 pg (ref 26.0–34.0)
MCHC: 32.7 g/dL (ref 30.0–36.0)
MCV: 86.2 fL (ref 80.0–100.0)
Monocytes Absolute: 0.5 10*3/uL (ref 0.1–1.0)
Monocytes Relative: 6 %
Neutro Abs: 5 10*3/uL (ref 1.7–7.7)
Neutrophils Relative %: 59 %
Platelets: 252 10*3/uL (ref 150–400)
RBC: 4.93 MIL/uL (ref 3.87–5.11)
RDW: 14.4 % (ref 11.5–15.5)
WBC: 8.3 10*3/uL (ref 4.0–10.5)
nRBC: 0 % (ref 0.0–0.2)

## 2024-04-07 LAB — COMPREHENSIVE METABOLIC PANEL WITH GFR
ALT: 18 U/L (ref 0–44)
AST: 22 U/L (ref 15–41)
Albumin: 3.4 g/dL — ABNORMAL LOW (ref 3.5–5.0)
Alkaline Phosphatase: 77 U/L (ref 38–126)
Anion gap: 12 (ref 5–15)
BUN: 9 mg/dL (ref 6–20)
CO2: 22 mmol/L (ref 22–32)
Calcium: 8.4 mg/dL — ABNORMAL LOW (ref 8.9–10.3)
Chloride: 104 mmol/L (ref 98–111)
Creatinine, Ser: 0.82 mg/dL (ref 0.44–1.00)
GFR, Estimated: >60 mL/min (ref >60)
Glucose, Bld: 96 mg/dL (ref 70–99)
Potassium: 3.4 mmol/L — ABNORMAL LOW (ref 3.5–5.1)
Sodium: 138 mmol/L (ref 135–145)
Total Bilirubin: 0.6 mg/dL (ref 0.0–1.2)
Total Protein: 6.7 g/dL (ref 6.5–8.1)

## 2024-04-07 LAB — HCG, SERUM, QUALITATIVE: Preg, Serum: NEGATIVE

## 2024-04-07 LAB — URINALYSIS, W/ REFLEX TO CULTURE (INFECTION SUSPECTED)
Bilirubin Urine: NEGATIVE
Glucose, UA: NEGATIVE mg/dL
Ketones, ur: NEGATIVE mg/dL
Leukocytes,Ua: NEGATIVE
Nitrite: NEGATIVE
Protein, ur: NEGATIVE mg/dL
RBC / HPF: >50 RBC/hpf (ref 0–5)
Specific Gravity, Urine: 1.008 (ref 1.005–1.030)
pH: 8 (ref 5.0–8.0)

## 2024-04-07 LAB — LIPASE, BLOOD: Lipase: 22 U/L (ref 11–51)

## 2024-04-07 MED ORDER — KETOROLAC TROMETHAMINE 10 MG PO TABS: 10.0000 mg | ORAL_TABLET | Freq: Four times a day (QID) | ORAL | 0 refills | Status: DC | PRN

## 2024-04-07 MED ORDER — KETOROLAC TROMETHAMINE 15 MG/ML IJ SOLN
15.0000 mg | Freq: Once | INTRAMUSCULAR | Status: AC
  Administered 2024-04-07: 15 mg via INTRAVENOUS
  Filled 2024-04-07: qty 1

## 2024-04-07 MED ORDER — MEDROXYPROGESTERONE ACETATE 10 MG PO TABS: 10.0000 mg | ORAL_TABLET | Freq: Every day | ORAL | 0 refills | Status: DC

## 2024-04-07 NOTE — ED Triage Notes (Addendum)
 Pt. Stated, Alexandra Gray had lower stomach pain with swelling and vaginal bleeding for 4 weeks

## 2024-04-07 NOTE — ED Triage Notes (Signed)
 Pt. Stated, Alexandra Gray had lower abdominal pain for 4 weeks .Denies any N/V

## 2024-04-07 NOTE — ED Provider Notes (Signed)
 Alexandra Gray   CSN: [id number] Arrival date & time: 04/07/24  0160     History  Chief Complaint  Patient presents with   Abdominal Pain   Vaginal Bleeding    Alexandra Gray is a 33 y.o. female.   Abdominal Pain Associated symptoms: fatigue and vaginal bleeding   Vaginal Bleeding Associated symptoms: abdominal pain and fatigue      33 year old female with medical history significant for STIs, PID, ovarian cysts, cluster B personality disorder presenting to the emergency department with vaginal bleeding.  The patient states that for the last 4 weeks she has had persistent abnormal uterine bleeding.  She is gone through 1-2 pads daily.  She endorses associated bilateral lower abdominal cramping.  She is taking pregnancy test at home and they have been negative.  She has not been sexually active during this time.  She denies any abnormal vaginal discharge.  She denies any dysuria or frequency.  She denies any upper abdominal pain, nausea or vomiting.  Endorses fatigue in the setting of this.  She has been seen multiple times via ED visits and video visits and had been encouraged to present in person for formal evaluation but has not yet done so.  She is not concerned for sexually transmitted infection.  Home Medications Prior to Admission medications   Medication Sig Start Date End Date Taking? Authorizing Provider  acetaminophen  (TYLENOL ) 500 MG tablet Take 2 tablets (1,000 mg total) by mouth every 6 (six) hours as needed. 06/24/23  Yes Marlin Simmonds, PA-C  ketorolac  (TORADOL ) 10 MG tablet Take 1 tablet (10 mg total) by mouth every 6 (six) hours as needed. 04/07/24  Yes Rosealee Concha, MD  medroxyPROGESTERone  (PROVERA ) 10 MG tablet Take 1 tablet (10 mg total) by mouth daily for 10 days. 04/07/24 04/17/24 Yes Rosealee Concha, MD  albuterol  (VENTOLIN  HFA) 108 (90 Base) MCG/ACT inhaler Inhale 1-2 puffs into the lungs every 6 (six)  hours as needed. Patient not taking: Reported on 04/07/2024 11/28/23   Angelia Kelp, PA-C  benzonatate  (TESSALON ) 100 MG capsule Take 1 capsule (100 mg total) by mouth 3 (three) times daily as needed. Patient not taking: Reported on 04/07/2024 02/25/24   Angelia Kelp, PA-C  colchicine  0.6 MG tablet Take 2 tabs immediately, then 1 tab twice per day for the duration of the flare up to a max of 7 days Patient not taking: Reported on 04/07/2024 02/23/24   Mayers, Cari S, PA-C  cyclobenzaprine  (FLEXERIL ) 10 MG tablet Take 0.5-1 tablets (5-10 mg total) by mouth 3 (three) times daily as needed. Patient not taking: Reported on 04/07/2024 12/14/23   Angelia Kelp, PA-C  fluticasone  (FLONASE ) 50 MCG/ACT nasal spray Place 2 sprays into both nostrils daily. Patient not taking: Reported on 04/07/2024 02/25/24   Burnette, Jennifer M, PA-C  oxyCODONE  (OXY IR/ROXICODONE ) 5 MG immediate release tablet Take 1 tablet (5 mg total) by mouth every 4 (four) hours as needed for moderate pain. Patient not taking: Reported on 04/07/2024 06/24/23   Marlin Simmonds, PA-C  predniSONE  (DELTASONE ) 20 MG tablet Take 2 tablets (40 mg total) by mouth daily with breakfast. Patient not taking: Reported on 04/07/2024 11/28/23   Burnette, Jennifer M, PA-C      Allergies    Infuvite adult [multiple vitamin] and Nubain  [nalbuphine  hcl]    Review of Systems   Review of Systems  Constitutional:  Positive for fatigue.  Gastrointestinal:  Positive for abdominal pain.  Genitourinary:  Positive for vaginal bleeding.  All other systems reviewed and are negative.   Physical Exam Updated Vital Signs BP 138/85 Comment (BP Location): left forearm  Pulse 70   Temp 98 F (36.7 C) (Oral)   Resp 18   Ht 5' (1.524 m)   Wt (!) 149.7 kg   SpO2 100%   BMI 64.45 kg/m  Physical Exam Vitals and nursing Gray reviewed.  Constitutional:      General: She is not in acute distress.    Appearance: She is well-developed. She is obese.   HENT:     Head: Normocephalic and atraumatic.  Eyes:     Conjunctiva/sclera: Conjunctivae normal.  Cardiovascular:     Rate and Rhythm: Normal rate and regular rhythm.     Heart sounds: No murmur heard. Pulmonary:     Effort: Pulmonary effort is normal. No respiratory distress.     Breath sounds: Normal breath sounds.  Abdominal:     Palpations: Abdomen is soft.     Tenderness: There is no abdominal tenderness. There is no guarding or rebound.  Genitourinary:    Comments: Pt declined pelvic exam Musculoskeletal:        General: No swelling.     Cervical back: Neck supple.  Skin:    General: Skin is warm and dry.     Capillary Refill: Capillary refill takes less than 2 seconds.  Neurological:     Mental Status: She is alert.  Psychiatric:        Mood and Affect: Mood normal.     ED Results / Procedures / Treatments   Labs (all labs ordered are listed, but only abnormal results are displayed) Labs Reviewed  COMPREHENSIVE METABOLIC PANEL WITH GFR - Abnormal; Notable for the following components:      Result Value   Potassium 3.4 (*)    Calcium 8.4 (*)    Albumin 3.4 (*)    All other components within normal limits  URINALYSIS, W/ REFLEX TO CULTURE (INFECTION SUSPECTED) - Abnormal; Notable for the following components:   Color, Urine STRAW (*)    Hgb urine dipstick MODERATE (*)    Bacteria, UA RARE (*)    All other components within normal limits  CBC WITH DIFFERENTIAL/PLATELET  LIPASE, BLOOD  HCG, SERUM, QUALITATIVE    EKG None  Radiology US  PELVIC COMPLETE W TRANSVAGINAL AND TORSION R/O Result Date: 04/07/2024 CLINICAL DATA:  Pain and vaginal bleeding EXAM: TRANSABDOMINAL AND TRANSVAGINAL ULTRASOUND OF PELVIS DOPPLER ULTRASOUND OF OVARIES TECHNIQUE: Both transabdominal and transvaginal ultrasound examinations of the pelvis were performed. Transabdominal technique was performed for global imaging of the pelvis including uterus, ovaries, adnexal regions, and pelvic  cul-de-sac. It was necessary to proceed with endovaginal exam following the transabdominal exam to visualize the endometrium and adnexa. Color and duplex Doppler ultrasound was utilized to evaluate blood flow to the ovaries. COMPARISON:  Ultrasound 06/14/2020 FINDINGS: Uterus Measurements: 6.7 x 3.2 x 4.3 cm = volume: 47.3 mL. No fibroids or other mass visualized. Endometrium Thickness: 8 mm.  No focal abnormality visualized. Right ovary Measurements: 2.9 x 2.3 x 3.0 cm = volume: 10.5 mL. Small follicles are seen. There is a 17 mm small cysts or dominant follicle. There is also a small shadowing echogenic area or calcification in the right ovary measuring 6 mm. Nonspecific calcification was seen on the prior study of 2021. Left ovary Measurements: 3.2 x 1.8 x 1.8 cm = volume: 5.4 mL. Small follicles are seen. Pulsed Doppler evaluation of both  ovaries demonstrates normal low-resistance arterial and venous waveforms. Other findings Limited transabdominal study due to overlapping bowel gas and soft tissue. Underdistended urinary bladder. Question some internal echoes or debris IMPRESSION: Unremarkable pelvic ultrasound. Preserved ovaries with blood flow. No free fluid. Underdistended urinary bladder. Question some internal echoes or debris Electronically Signed   By: Adrianna Horde M.D.   On: 04/07/2024 10:01    Procedures Procedures    Medications Ordered in ED Medications  ketorolac  (TORADOL ) 15 MG/ML injection 15 mg (15 mg Intravenous Given 04/07/24 1147)    ED Course/ Medical Decision Making/ A&P Clinical Course as of 04/07/24 1251  Mon Apr 07, 2024  1053 Hemoglobin: 13.9 Hgb is stable [JL]    Clinical Course User Index [JL] Rosealee Concha, MD                                 Medical Decision Making Amount and/or Complexity of Data Reviewed Labs: ordered. Decision-making details documented in ED Course. Radiology: ordered.  Risk Prescription drug management.     33 year old female with  medical history significant for STIs, PID, ovarian cysts, cluster B personality disorder presenting to the emergency department with vaginal bleeding.  The patient states that for the last 4 weeks she has had persistent abnormal uterine bleeding.  She is gone through 1-2 pads daily.  She endorses associated bilateral lower abdominal cramping.  She is taking pregnancy test at home and they have been negative.  She has not been sexually active during this time.  She denies any abnormal vaginal discharge.  She denies any dysuria or frequency.  She denies any upper abdominal pain, nausea or vomiting.  Endorses fatigue in the setting of this.  She has been seen multiple times via ED visits and video visits and had been encouraged to present in person for formal evaluation but has not yet done so.  She is not concerned for sexually transmitted infection.  On arrival, the patient was vitally stable.  Presenting with roughly 4 weeks of abnormal uterine bleeding, not particularly heavy 1-2 pads daily.  Patient with fatigue, considered anemia from abnormal uterine bleeding.  Considered etiology such as uterine fibroids, will evaluate further with ultrasound of the pelvis.  Will obtain screening labs.  Patient declined a pelvic exam and is not concerned for sexually transmitted infection, is not currently sexually active.  Labs: Urinalysis with hematuria, negative for UTI, hCG negative, lipase normal, CBC without a leukocytosis, no anemia hemoglobin 13.9, CMP with only mild hypokalemia to 3.4, no other acute abnormality.  Pelvic ultrasound: IMPRESSION:  Unremarkable pelvic ultrasound. Preserved ovaries with blood flow.  No free fluid.    Underdistended urinary bladder. Question some internal echoes or  debris    Feeling symptomatically improved status post IV Toradol .  Will increase the patient's Provera  to 10 mg daily for 10-day course and have her follow-up outpatient with gynecology.  Overall stable for  discharge and continued outpatient follow-up with GYN.    Final Clinical Impression(s) / ED Diagnoses Final diagnoses:  Abnormal uterine bleeding (AUB)    Rx / DC Orders ED Discharge Orders          Ordered    medroxyPROGESTERone  (PROVERA ) 10 MG tablet  Daily        04/07/24 1242    Ambulatory referral to Gynecology        04/07/24 1249    ketorolac  (TORADOL ) 10 MG tablet  Every 6  hours PRN        04/07/24 1250              Rosealee Concha, MD 04/07/24 1252

## 2024-04-07 NOTE — ED Notes (Signed)
 Patient transported to Ultrasound

## 2024-04-07 NOTE — Discharge Instructions (Signed)
 Your ultrasound was unremarkable without evidence of uterine fibroids or other abnormality.  Your abnormal uterine bleeding will be treated with a course of Provera  10 mg which should thin the lining of your endometrium helping to stop the bleeding.  Please follow-up outpatient with an OB/GYN for continued management.  Your hemoglobin today was normal.

## 2024-04-10 ENCOUNTER — Telehealth: Payer: MEDICAID

## 2024-04-10 ENCOUNTER — Emergency Department (HOSPITAL_COMMUNITY)
Admission: EM | Admit: 2024-04-10 | Discharge: 2024-04-10 | Disposition: A | Discharge status: Home / Self Care | Payer: MEDICAID | Attending: Emergency Medicine | Admitting: Emergency Medicine

## 2024-04-10 DIAGNOSIS — N939 Abnormal uterine and vaginal bleeding, unspecified: Secondary | ICD-10-CM | POA: Diagnosis present

## 2024-04-10 LAB — CBC WITH DIFFERENTIAL/PLATELET
Abs Immature Granulocytes: 0.04 10*3/uL (ref 0.00–0.07)
Basophils Absolute: 0.1 10*3/uL (ref 0.0–0.1)
Basophils Relative: 1 %
Eosinophils Absolute: 0.4 10*3/uL (ref 0.0–0.5)
Eosinophils Relative: 4 %
HCT: 42.8 % (ref 36.0–46.0)
Hemoglobin: 14.1 g/dL (ref 12.0–15.0)
Immature Granulocytes: 0 %
Lymphocytes Relative: 26 %
Lymphs Abs: 2.8 10*3/uL (ref 0.7–4.0)
MCH: 28.3 pg (ref 26.0–34.0)
MCHC: 32.9 g/dL (ref 30.0–36.0)
MCV: 85.9 fL (ref 80.0–100.0)
Monocytes Absolute: 0.5 10*3/uL (ref 0.1–1.0)
Monocytes Relative: 5 %
Neutro Abs: 7 10*3/uL (ref 1.7–7.7)
Neutrophils Relative %: 64 %
Platelets: 273 10*3/uL (ref 150–400)
RBC: 4.98 MIL/uL (ref 3.87–5.11)
RDW: 14.2 % (ref 11.5–15.5)
WBC: 10.8 10*3/uL — ABNORMAL HIGH (ref 4.0–10.5)
nRBC: 0 % (ref 0.0–0.2)

## 2024-04-10 LAB — URINALYSIS, ROUTINE W REFLEX MICROSCOPIC
Bacteria, UA: NONE SEEN
Bilirubin Urine: NEGATIVE
Glucose, UA: NEGATIVE mg/dL
Ketones, ur: NEGATIVE mg/dL
Nitrite: NEGATIVE
Protein, ur: 30 mg/dL — AB
Specific Gravity, Urine: 1.033 — ABNORMAL HIGH (ref 1.005–1.030)
WBC, UA: >50 WBC/hpf (ref 0–5)
pH: 5 (ref 5.0–8.0)

## 2024-04-10 LAB — BASIC METABOLIC PANEL WITH GFR
Anion gap: 9 (ref 5–15)
BUN: 9 mg/dL (ref 6–20)
CO2: 23 mmol/L (ref 22–32)
Calcium: 8.9 mg/dL (ref 8.9–10.3)
Chloride: 107 mmol/L (ref 98–111)
Creatinine, Ser: 0.63 mg/dL (ref 0.44–1.00)
GFR, Estimated: >60 mL/min (ref >60)
Glucose, Bld: 97 mg/dL (ref 70–99)
Potassium: 3.4 mmol/L — ABNORMAL LOW (ref 3.5–5.1)
Sodium: 139 mmol/L (ref 135–145)

## 2024-04-10 LAB — HCG, SERUM, QUALITATIVE: Preg, Serum: NEGATIVE

## 2024-04-10 MED ORDER — IBUPROFEN 800 MG PO TABS
800.0000 mg | ORAL_TABLET | Freq: Once | ORAL | Status: AC
  Administered 2024-04-10: 800 mg via ORAL
  Filled 2024-04-10: qty 1

## 2024-04-10 MED ORDER — IBUPROFEN 800 MG PO TABS
800.0000 mg | ORAL_TABLET | Freq: Three times a day (TID) | ORAL | 0 refills | Status: AC
Start: 2024-04-10 — End: 2024-04-20

## 2024-04-10 MED ORDER — MEGESTROL ACETATE 40 MG PO TABS: ORAL_TABLET | ORAL | 0 refills | Status: AC

## 2024-04-10 MED ORDER — MEGESTROL ACETATE 40 MG PO TABS
40.0000 mg | ORAL_TABLET | Freq: Every day | ORAL | Status: DC
  Administered 2024-04-10: 40 mg via ORAL
  Filled 2024-04-10: qty 1

## 2024-04-10 NOTE — ED Triage Notes (Signed)
 Pt c/o lower abd pain and continued vaginal bleeding. She states this has been ongoing for several weeks. Pt states she goes through two pads daily, but will still have bleeding through them overnight.

## 2024-04-10 NOTE — ED Provider Notes (Signed)
 [place] EMERGENCY DEPARTMENT AT [facility] Provider Note   CSN: [id number] Arrival date & time: 04/10/24  1258     History  Chief Complaint  Patient presents with   Vaginal Bleeding    Alexandra Gray is a 33 y.o. female with history of obesity presenting to the ED with concern for continued abdominal cramping and vaginal bleeding.  Patient reports she has had vaginal bleeding for about 5 weeks now on a near daily basis.  This is often accompanied by abdominal pain and pressure.  Patient was seen in the ED 3 days ago which time she had a vaginal ultrasound performed and uterine ultrasound which showed no emergent findings per my review of report.  Her hemoglobin was normal.  She was started on Provera  which she has been taking now for 3 days.  No change in her bleeding habits.  She says she is not able to make an appointment with her OB/GYN until the beginning of next month.  She reports she has been taken Motrin  prior to coming to the ED, did not seem to be helping.  HPI     Home Medications Prior to Admission medications   Medication Sig Start Date End Date Taking? Authorizing Provider  ibuprofen  (ADVIL ) 800 MG tablet Take 1 tablet (800 mg total) by mouth 3 (three) times daily for 10 days. 04/10/24 04/20/24 Yes Annamae Shivley, Janalyn Me, MD  megestrol (MEGACE) 40 MG tablet Take 1 tablet (40 mg total) by mouth 2 (two) times daily for 5 days, THEN 0.5 tablets (20 mg total) daily for 5 days. 04/11/24 04/21/24 Yes Edwar Coe, Janalyn Me, MD  acetaminophen  (TYLENOL ) 500 MG tablet Take 2 tablets (1,000 mg total) by mouth every 6 (six) hours as needed. 06/24/23   Marlin Simmonds, PA-C  albuterol  (VENTOLIN  HFA) 108 (90 Base) MCG/ACT inhaler Inhale 1-2 puffs into the lungs every 6 (six) hours as needed. Patient not taking: Reported on 04/07/2024 11/28/23   Burnette, Jennifer M, PA-C  benzonatate  (TESSALON ) 100 MG capsule Take 1 capsule (100 mg total) by mouth 3 (three) times daily as  needed. Patient not taking: Reported on 04/07/2024 02/25/24   Angelia Kelp, PA-C  colchicine  0.6 MG tablet Take 2 tabs immediately, then 1 tab twice per day for the duration of the flare up to a max of 7 days Patient not taking: Reported on 04/07/2024 02/23/24   Mayers, Cari S, PA-C  cyclobenzaprine  (FLEXERIL ) 10 MG tablet Take 0.5-1 tablets (5-10 mg total) by mouth 3 (three) times daily as needed. Patient not taking: Reported on 04/07/2024 12/14/23   Angelia Kelp, PA-C  fluticasone  (FLONASE ) 50 MCG/ACT nasal spray Place 2 sprays into both nostrils daily. Patient not taking: Reported on 04/07/2024 02/25/24   Burnette, Jennifer M, PA-C  ketorolac  (TORADOL ) 10 MG tablet Take 1 tablet (10 mg total) by mouth every 6 (six) hours as needed. 04/07/24   Rosealee Concha, MD  oxyCODONE  (OXY IR/ROXICODONE ) 5 MG immediate release tablet Take 1 tablet (5 mg total) by mouth every 4 (four) hours as needed for moderate pain. Patient not taking: Reported on 04/07/2024 06/24/23   Marlin Simmonds, PA-C  predniSONE  (DELTASONE ) 20 MG tablet Take 2 tablets (40 mg total) by mouth daily with breakfast. Patient not taking: Reported on 04/07/2024 11/28/23   Burnette, Jennifer M, PA-C      Allergies    Infuvite adult [multiple vitamin] and Nubain  [nalbuphine  hcl]    Review of Systems   Review of Systems  Physical Exam  Updated Vital Signs BP (!) 158/90   Pulse 62   Temp 98.9 F (37.2 C) (Oral)   Resp (!) 21   SpO2 100%  Physical Exam Constitutional:      General: She is not in acute distress.    Appearance: She is obese.  HENT:     Head: Normocephalic and atraumatic.  Eyes:     Conjunctiva/sclera: Conjunctivae normal.     Pupils: Pupils are equal, round, and reactive to light.  Cardiovascular:     Rate and Rhythm: Normal rate and regular rhythm.  Pulmonary:     Effort: Pulmonary effort is normal. No respiratory distress.  Abdominal:     General: There is no distension.     Tenderness: There is no  abdominal tenderness.  Skin:    General: Skin is warm and dry.  Neurological:     General: No focal deficit present.     Mental Status: She is alert. Mental status is at baseline.  Psychiatric:        Mood and Affect: Mood normal.        Behavior: Behavior normal.     ED Results / Procedures / Treatments   Labs (all labs ordered are listed, but only abnormal results are displayed) Labs Reviewed  BASIC METABOLIC PANEL WITH GFR - Abnormal; Notable for the following components:      Result Value   Potassium 3.4 (*)    All other components within normal limits  CBC WITH DIFFERENTIAL/PLATELET - Abnormal; Notable for the following components:   WBC 10.8 (*)    All other components within normal limits  URINALYSIS, ROUTINE W REFLEX MICROSCOPIC - Abnormal; Notable for the following components:   Color, Urine AMBER (*)    APPearance CLOUDY (*)    Specific Gravity, Urine 1.033 (*)    Hgb urine dipstick SMALL (*)    Protein, ur 30 (*)    Leukocytes,Ua MODERATE (*)    All other components within normal limits  HCG, SERUM, QUALITATIVE    EKG None  Radiology No results found.  Procedures Procedures    Medications Ordered in ED Medications  megestrol (MEGACE) tablet 40 mg (40 mg Oral Given 04/10/24 1707)  ibuprofen  (ADVIL ) tablet 800 mg (800 mg Oral Given 04/10/24 1706)    ED Course/ Medical Decision Making/ A&P                                 Medical Decision Making Risk Prescription drug management.   Patient is here with continued concern for abnormal uterine bleeding.  I reviewed her external records including pelvic ultrasound 3 days ago that showed small follicles of the bilateral ovaries, but no other significant abnormal findings,, normal blood flow to the ovaries.  I reviewed her labs and workup today.  Hemoglobin is normal at 14.  No significant change or drop in her hemoglobin.  No signs or symptoms of acute onset anemia.  She is not requiring a blood  transfusion or repeat imaging at this time.  Low suspicion for other acute intra-abdominal surgical or infectious emergency.  No concern for STI/PID - per report pt not sexually active. Preg negative here.  We will switch her to Megace for AUB, stop Provera  for now.  Start at 40 mg BID for 5 days, then 20 mg daily for another 5 days.  I did discuss with the patient the small increased risk of thrombosis being on estrogen  hormone, she has no prior history of DVT or PE or family history, or other risk factors for thrombosis to prevent initiating estrogen at this time.        Final Clinical Impression(s) / ED Diagnoses Final diagnoses:  Abnormal uterine bleeding    Rx / DC Orders ED Discharge Orders          Ordered    megestrol (MEGACE) 40 MG tablet  Multiple Frequencies        04/10/24 1724    ibuprofen  (ADVIL ) 800 MG tablet  3 times daily        04/10/24 1724              Arvilla Birmingham, MD 04/10/24 1724

## 2024-04-10 NOTE — ED Notes (Addendum)
 Pt c/o vaginal bleeding x 5 weeks. Pt states that bleeding has progressively increased since onset. Bleeding is accompanied by abdominal pain and pressure rates 10/10. Pt last menstrual cycle was "March or June 2024" when Nexplanon  was placed.

## 2024-04-10 NOTE — ED Provider Triage Note (Signed)
 Emergency Medicine Provider Triage Evaluation Note  Alexandra Gray , a 33 y.o. female  was evaluated in triage.  Pt complains of heavy vaginal bleeding x 5 weeks seen recently for the same.  She states she is very fatigued but not having specifically short of this of breath or near syncope.  Complains of severe pelvic pain radiating into her anterior thighs.  Unable to see her gynecologist until next month on Nexplanon .  Review of Systems  Positive: Vaginal bleeding Negative: Vomiting  Physical Exam  BP 132/88 (BP Location: Right Arm)   Pulse 72   Temp 98.7 F (37.1 C)   Resp 20   SpO2 100%  Gen:   Awake, no distress   Resp:  Normal effort  MSK:   Moves extremities without difficulty  Other:    Medical Decision Making  Medically screening exam initiated at 1:37 PM.  Appropriate orders placed.  Alexandra Gray was informed that the remainder of the evaluation will be completed by another provider, this initial triage assessment does not replace that evaluation, and the importance of remaining in the ED until their evaluation is complete.  Patient states that she is soaking through a pad an hour   Alexandra Fails, PA-C 04/10/24 1338

## 2024-05-12 ENCOUNTER — Other Ambulatory Visit: Payer: Self-pay

## 2024-05-12 ENCOUNTER — Emergency Department (HOSPITAL_COMMUNITY): Payer: MEDICAID

## 2024-05-12 ENCOUNTER — Emergency Department (HOSPITAL_COMMUNITY)
Admission: EM | Admit: 2024-05-12 | Discharge: 2024-05-13 | Disposition: A | Discharge status: Home / Self Care | Payer: MEDICAID | Attending: Emergency Medicine | Admitting: Emergency Medicine

## 2024-05-12 ENCOUNTER — Encounter (HOSPITAL_COMMUNITY): Payer: Self-pay

## 2024-05-12 ENCOUNTER — Telehealth: Payer: MEDICAID | Admitting: Family Medicine

## 2024-05-12 DIAGNOSIS — R519 Headache, unspecified: Secondary | ICD-10-CM | POA: Diagnosis present

## 2024-05-12 DIAGNOSIS — R0789 Other chest pain: Secondary | ICD-10-CM | POA: Diagnosis not present

## 2024-05-12 DIAGNOSIS — R531 Weakness: Secondary | ICD-10-CM | POA: Diagnosis not present

## 2024-05-12 DIAGNOSIS — I10 Essential (primary) hypertension: Secondary | ICD-10-CM | POA: Diagnosis not present

## 2024-05-12 DIAGNOSIS — R202 Paresthesia of skin: Secondary | ICD-10-CM | POA: Insufficient documentation

## 2024-05-12 DIAGNOSIS — R110 Nausea: Secondary | ICD-10-CM | POA: Insufficient documentation

## 2024-05-12 DIAGNOSIS — R5383 Other fatigue: Secondary | ICD-10-CM | POA: Insufficient documentation

## 2024-05-12 DIAGNOSIS — R200 Anesthesia of skin: Secondary | ICD-10-CM | POA: Insufficient documentation

## 2024-05-12 DIAGNOSIS — R6884 Jaw pain: Secondary | ICD-10-CM

## 2024-05-12 LAB — CBC
HCT: 42.5 % (ref 36.0–46.0)
Hemoglobin: 13.4 g/dL (ref 12.0–15.0)
MCH: 27.3 pg (ref 26.0–34.0)
MCHC: 31.5 g/dL (ref 30.0–36.0)
MCV: 86.6 fL (ref 80.0–100.0)
Platelets: 256 10*3/uL (ref 150–400)
RBC: 4.91 MIL/uL (ref 3.87–5.11)
RDW: 13.8 % (ref 11.5–15.5)
WBC: 10.7 10*3/uL — ABNORMAL HIGH (ref 4.0–10.5)
nRBC: 0 % (ref 0.0–0.2)

## 2024-05-12 LAB — BASIC METABOLIC PANEL WITH GFR
Anion gap: 8 (ref 5–15)
BUN: 9 mg/dL (ref 6–20)
CO2: 28 mmol/L (ref 22–32)
Calcium: 8.8 mg/dL — ABNORMAL LOW (ref 8.9–10.3)
Chloride: 104 mmol/L (ref 98–111)
Creatinine, Ser: 0.69 mg/dL (ref 0.44–1.00)
GFR, Estimated: >60 mL/min (ref >60)
Glucose, Bld: 84 mg/dL (ref 70–99)
Potassium: 3.6 mmol/L (ref 3.5–5.1)
Sodium: 140 mmol/L (ref 135–145)

## 2024-05-12 LAB — TROPONIN I (HIGH SENSITIVITY): Troponin I (High Sensitivity): <2 ng/L (ref <18)

## 2024-05-12 NOTE — Progress Notes (Signed)
 - having facial pain, jaw pain and trouble opening jaw on left side. In person is advised    Patient acknowledged agreement and understanding of the plan.

## 2024-05-12 NOTE — ED Triage Notes (Signed)
 Pt to ED c/o left sided facial pain and numbness that started yesterday around 2300 last night. Pt reports CP that started today. No n/v.  NAD in triage, .

## 2024-05-12 NOTE — ED Provider Triage Note (Signed)
 Emergency Medicine Provider Triage Evaluation Note  Alexandra Gray , a 33 y.o. female  was evaluated in triage.  Pt complains of left-sided facial numbness since last night and chest pain.  Review of Systems  Positive: Chest pain, facial numbness Negative: Nausea, vomiting, chills, fever, shortness of breath, abdominal pain, weakness  Physical Exam  BP (!) 147/97 (BP Location: Right Arm)   Pulse 85   Temp (!) 97.5 F (36.4 C)   Resp (!) 22   Ht 5' (1.524 m)   Wt (!) 149.7 kg   SpO2 100%   BMI 64.45 kg/m  Gen:   Awake, no distress   Resp:  Normal effort  MSK:   Moves extremities without difficulty  Other:  No facial droop noted on exam, equal bilateral grip strength  Medical Decision Making  Medically screening exam initiated at 9:25 PM.  Appropriate orders placed.  Alexandra Gray was informed that the remainder of the evaluation will be completed by another provider, this initial triage assessment does not replace that evaluation, and the importance of remaining in the ED until their evaluation is complete.  Labs and imaging ordered   Merryl Abraham 05/12/24 2126

## 2024-05-13 ENCOUNTER — Emergency Department (HOSPITAL_COMMUNITY): Payer: MEDICAID

## 2024-05-13 LAB — TROPONIN I (HIGH SENSITIVITY): Troponin I (High Sensitivity): <2 ng/L (ref <18)

## 2024-05-13 LAB — D-DIMER, QUANTITATIVE: D-Dimer, Quant: 0.46 ug{FEU}/mL (ref 0.00–0.50)

## 2024-05-13 MED ORDER — KETOROLAC TROMETHAMINE 15 MG/ML IJ SOLN
15.0000 mg | Freq: Once | INTRAMUSCULAR | Status: AC
  Administered 2024-05-13: 15 mg via INTRAVENOUS
  Filled 2024-05-13: qty 1

## 2024-05-13 MED ORDER — MAGNESIUM SULFATE 2 GM/50ML IV SOLN
2.0000 g | Freq: Once | INTRAVENOUS | Status: AC
  Administered 2024-05-13: 2 g via INTRAVENOUS
  Filled 2024-05-13: qty 50

## 2024-05-13 MED ORDER — METOCLOPRAMIDE HCL 5 MG/ML IJ SOLN
10.0000 mg | Freq: Once | INTRAMUSCULAR | Status: AC
  Administered 2024-05-13: 10 mg via INTRAVENOUS
  Filled 2024-05-13: qty 2

## 2024-05-13 MED ORDER — IOHEXOL 350 MG/ML SOLN
75.0000 mL | Freq: Once | INTRAVENOUS | Status: AC | PRN
  Administered 2024-05-13: 75 mL via INTRAVENOUS

## 2024-05-13 MED ORDER — DEXAMETHASONE SODIUM PHOSPHATE 10 MG/ML IJ SOLN
10.0000 mg | Freq: Once | INTRAMUSCULAR | Status: AC
  Administered 2024-05-13: 10 mg via INTRAVENOUS
  Filled 2024-05-13: qty 1

## 2024-05-13 MED ORDER — DIPHENHYDRAMINE HCL 50 MG/ML IJ SOLN
25.0000 mg | Freq: Once | INTRAMUSCULAR | Status: AC
  Administered 2024-05-13: 25 mg via INTRAVENOUS
  Filled 2024-05-13: qty 1

## 2024-05-13 NOTE — Discharge Instructions (Signed)
 Your testing is negative for stroke or aneurysm.  Follow-up with your primary doctor as well as neurologist.  Return to the ED with sudden onset headache, unilateral weakness, numbness, tingling, difficulty speaking of difficulty swallowing or other concerns.

## 2024-05-13 NOTE — ED Provider Notes (Signed)
 [place] EMERGENCY DEPARTMENT AT [facility] Provider Note   CSN: [id number] Arrival date & time: 05/12/24  2111     Patient presents with: Chest Pain and Facial Pain   Alexandra Gray is a 33 y.o. female.   Patient with a history of hypertension, depression, PID presents with left-sided headache and facial numbness.  States symptoms started 2 days ago.  Complains of left side of face not feeling right that has been ongoing for the past 2 days also with some numbness to her left arm.  Headache was gradual in onset but progressively worsening associated with nausea but no vomiting.  No fever.  She is having some weakness to her left arm as well.  No visual changes.  No fever.  Has some chest tightness in triage which is since resolved.  She estimates her chest tightness lasted for less than 1 hour.  She is not having any chest pain or tightness currently.  No abdominal pain, cough or fever.  She has had headaches like this in the past but the stabbing sensation to her left face and head is new.  The history is provided by the patient.  Chest Pain Associated symptoms: fatigue, headache and nausea   Associated symptoms: no abdominal pain, no cough, no dizziness, no fever, no shortness of breath and no vomiting        Prior to Admission medications   Medication Sig Start Date End Date Taking? Authorizing Provider  acetaminophen  (TYLENOL ) 500 MG tablet Take 2 tablets (1,000 mg total) by mouth every 6 (six) hours as needed. 06/24/23   Marlin Simmonds, PA-C  albuterol  (VENTOLIN  HFA) 108 (90 Base) MCG/ACT inhaler Inhale 1-2 puffs into the lungs every 6 (six) hours as needed. Patient not taking: Reported on 04/07/2024 11/28/23   Burnette, Jennifer M, PA-C  benzonatate  (TESSALON ) 100 MG capsule Take 1 capsule (100 mg total) by mouth 3 (three) times daily as needed. Patient not taking: Reported on 04/07/2024 02/25/24   Angelia Kelp, PA-C  colchicine  0.6 MG tablet Take 2 tabs  immediately, then 1 tab twice per day for the duration of the flare up to a max of 7 days Patient not taking: Reported on 04/07/2024 02/23/24   Mayers, Cari S, PA-C  cyclobenzaprine  (FLEXERIL ) 10 MG tablet Take 0.5-1 tablets (5-10 mg total) by mouth 3 (three) times daily as needed. Patient not taking: Reported on 04/07/2024 12/14/23   Angelia Kelp, PA-C  fluticasone  (FLONASE ) 50 MCG/ACT nasal spray Place 2 sprays into both nostrils daily. Patient not taking: Reported on 04/07/2024 02/25/24   Burnette, Jennifer M, PA-C  ketorolac  (TORADOL ) 10 MG tablet Take 1 tablet (10 mg total) by mouth every 6 (six) hours as needed. 04/07/24   Rosealee Concha, MD  oxyCODONE  (OXY IR/ROXICODONE ) 5 MG immediate release tablet Take 1 tablet (5 mg total) by mouth every 4 (four) hours as needed for moderate pain. Patient not taking: Reported on 04/07/2024 06/24/23   Marlin Simmonds, PA-C  predniSONE  (DELTASONE ) 20 MG tablet Take 2 tablets (40 mg total) by mouth daily with breakfast. Patient not taking: Reported on 04/07/2024 11/28/23   Burnette, Jennifer M, PA-C    Allergies: Infuvite adult [multiple vitamin] and Nubain  [nalbuphine  hcl]    Review of Systems  Constitutional:  Positive for fatigue. Negative for activity change, appetite change and fever.  HENT:  Negative for congestion and rhinorrhea.   Respiratory:  Negative for cough, chest tightness and shortness of breath.   Cardiovascular:  Positive  for chest pain.  Gastrointestinal:  Positive for nausea. Negative for abdominal pain and vomiting.  Genitourinary:  Negative for dysuria and hematuria.  Musculoskeletal:  Negative for arthralgias and myalgias.  Skin:  Negative for rash.  Neurological:  Positive for light-headedness and headaches. Negative for dizziness.   all other systems are negative except as noted in the HPI and PMH.    Updated Vital Signs BP (!) 135/91   Pulse 76   Temp 98 F (36.7 C)   Resp 17   Ht 5' (1.524 m)   Wt (!) 149.7 kg   SpO2  100%   BMI 64.45 kg/m   Physical Exam Vitals and nursing note reviewed.  Constitutional:      General: She is not in acute distress.    Appearance: She is well-developed. She is not ill-appearing.  HENT:     Head: Normocephalic and atraumatic.     Comments: Paresthesias left face.  No facial asymmetry.  No temporal artery tenderness    Mouth/Throat:     Pharynx: No oropharyngeal exudate.   Eyes:     Conjunctiva/sclera: Conjunctivae normal.     Pupils: Pupils are equal, round, and reactive to light.   Neck:     Comments: No meningismus. Cardiovascular:     Rate and Rhythm: Normal rate and regular rhythm.     Heart sounds: Normal heart sounds. No murmur heard. Pulmonary:     Effort: Pulmonary effort is normal. No respiratory distress.     Breath sounds: Normal breath sounds.  Chest:     Chest wall: No tenderness.  Abdominal:     Palpations: Abdomen is soft.     Tenderness: There is no abdominal tenderness. There is no guarding or rebound.   Musculoskeletal:        General: No tenderness. Normal range of motion.     Cervical back: Normal range of motion and neck supple.   Skin:    General: Skin is warm.   Neurological:     Mental Status: She is alert and oriented to person, place, and time.     Cranial Nerves: No cranial nerve deficit.     Motor: No abnormal muscle tone.     Coordination: Coordination normal.     Comments: No facial droop.  Tongue is midline.  Slightly decreased grip strength on the left.  5/5 strength otherwise.  Able to lift arms and legs off the bed bilaterally without difficulty.  Psychiatric:        Behavior: Behavior normal.     (all labs ordered are listed, but only abnormal results are displayed) Labs Reviewed  BASIC METABOLIC PANEL WITH GFR - Abnormal; Notable for the following components:      Result Value   Calcium 8.8 (*)    All other components within normal limits  CBC - Abnormal; Notable for the following components:   WBC 10.7  (*)    All other components within normal limits  D-DIMER, QUANTITATIVE  TROPONIN I (HIGH SENSITIVITY)  TROPONIN I (HIGH SENSITIVITY)    EKG: EKG Interpretation Date/Time:  Tuesday May 13 2024 04:49:36 EDT Ventricular Rate:  69 PR Interval:  172 QRS Duration:  131 QT Interval:  400 QTC Calculation: 429 R Axis:   118  Text Interpretation: Right and left arm electrode reversal, interpretation assumes no reversal Sinus or ectopic atrial rhythm Nonspecific intraventricular conduction delay Probable lateral infarct, age indeterminate No significant change was found Confirmed by Earma Gloss [phone]) on 05/13/2024 4:56:21 AM  Radiology: CT ANGIO HEAD NECK W WO CM Result Date: 05/13/2024 CLINICAL DATA:  33 year old female with neurologic deficit. Left side facial pain and numbness. Chest pain. EXAM: CT ANGIOGRAPHY HEAD AND NECK WITH AND WITHOUT CONTRAST TECHNIQUE: Multidetector CT imaging of the head and neck was performed using the standard protocol during bolus administration of intravenous contrast. Multiplanar CT image reconstructions and MIPs were obtained to evaluate the vascular anatomy. Carotid stenosis measurements (when applicable) are obtained utilizing NASCET criteria, using the distal internal carotid diameter as the denominator. RADIATION DOSE REDUCTION: This exam was performed according to the departmental dose-optimization program which includes automated exposure control, adjustment of the mA and/or kV according to patient size and/or use of iterative reconstruction technique. CONTRAST:  75mL OMNIPAQUE  IOHEXOL  350 MG/ML SOLN COMPARISON:  Brain MRI this morning, head CT yesterday. FINDINGS: CTA NECK Skeleton: Recent appearing posterior left mandible dental extraction site series 1, image 154, no adverse features. Contralateral right anterior mandible recent extraction site also on image 116. Mandible appears otherwise intact and aligned. No acute osseous abnormality identified.  Chronic right lamina papyracea fracture. Upper chest: Negative. Other neck: Nonvascular neck soft tissue spaces are within normal limits. Aortic arch: 4 vessel arch, the left vertebral artery arises directly from the arch in between the left CCA and subclavian arteries. No arch atherosclerosis. Right carotid system: Dense left subclavian and innominate venous contrast streak artifact. Brachiocephalic artery and proximal right CCA appear to remain patent and normal. Partially retropharyngeal course of the right CCA. Negative right carotid bifurcation. Tortuous right ICA distal to the bulb, otherwise negative to the skull base. Left carotid system: Similar mild tortuosity and partially retropharyngeal course, otherwise negative. Vertebral arteries: Proximal right subclavian artery and right vertebral artery origin appear normal. Limited right vertebral artery detail due to vessel size and large body habitus quantum mottle artifact. The right vertebral artery appears patent to the skull base with no abnormality identified. Left vertebral artery arises directly from the aortic arch and has a late entry into the cervical transverse foramen. Similar limited left vertebral artery detail but the vessel appears codominant and patent to the skull base. CTA HEAD Posterior circulation: Codominant distal vertebral arteries, normal PICA origins, vertebrobasilar junction, basilar artery, SCA and PCA origins appear patent and within normal limits. Both posterior communicating arteries are present, fetal type left PCA origin. Bilateral PCA branches are within normal limits. Anterior circulation: Both ICA siphons appear patent and normal. Both posterior communicating artery origins are normal. Patent carotid termini. Patent MCA and ACA origins. Dominant left A1. Normal anterior communicating artery. Bilateral ACA branches are within normal limits. Left MCA M1 segment and early bifurcation are patent without stenosis. Right MCA M1  segment and more distal bifurcation are patent without stenosis. Bilateral MCA branches are within normal limits. Venous sinuses: Dedicated CTV is reported separately today. Anatomic variants: Codominant left vertebral artery arises directly from the aortic arch. Somewhat fetal type PCA origins. Dominant left ACA A1. Review of the MIP images confirms the above findings IMPRESSION: 1. Normal CTA head. Lower Neck CTA mildly degraded by artifact from large body habitus and venous streak but appears negative. No atherosclerosis or stenosis identified. 2. Recent appearing mandible dental extractions including posteriorly on the left side, no adverse features. Electronically Signed   By: Marlise Simpers M.D.   On: 05/13/2024 07:08   MR BRAIN WO CONTRAST Result Date: 05/13/2024 CLINICAL DATA:  33 year old female with neurologic deficit. Left side facial pain and numbness. Chest pain. EXAM: MRI HEAD  WITHOUT CONTRAST TECHNIQUE: Multiplanar, multiecho pulse sequences of the brain and surrounding structures were obtained without intravenous contrast. COMPARISON:  Head CT yesterday.  Brain MRI 04/11/2022. FINDINGS: Brain: Cerebral volume remains normal. No restricted diffusion to suggest acute infarction. No midline shift, mass effect, evidence of mass lesion, ventriculomegaly, extra-axial collection or acute intracranial hemorrhage. Cervicomedullary junction and pituitary are within normal limits. Cerebral morphology, gray and white matter signal appears stable since 2023 and normal. No encephalomalacia or chronic cerebral blood products (T2*) identified. Vascular: Major intracranial vascular flow voids are stable since 2023. Skull and upper cervical spine: Stable visible bone marrow signal, within normal limits. Negative visible cervical spine. Sinuses/Orbits: A chronic appearing right lamina papyracea fracture is new from the previous MRI. Mildly Disconjugate gaze, otherwise orbits soft tissues appear negative. Paranasal sinuses  remain well aerated, small chronic maxillary sinus retention cysts. Other: Mastoids appear clear. Stylomastoid foramina, visible scalp and face soft tissues appear negative. IMPRESSION: 1. Stable since 2023 and normal noncontrast MRI appearance of the Brain. 2. Chronic appearing right lamina papyracea fracture, new from 2023. Electronically Signed   By: Marlise Simpers M.D.   On: 05/13/2024 05:57   CT Head Wo Contrast Result Date: 05/12/2024 CLINICAL DATA:  Neuro deficit. EXAM: CT HEAD WITHOUT CONTRAST TECHNIQUE: Contiguous axial images were obtained from the base of the skull through the vertex without intravenous contrast. RADIATION DOSE REDUCTION: This exam was performed according to the departmental dose-optimization program which includes automated exposure control, adjustment of the mA and/or kV according to patient size and/or use of iterative reconstruction technique. COMPARISON:  None Available. FINDINGS: Brain: No evidence of acute infarction, hemorrhage, hydrocephalus, extra-axial collection or mass lesion/mass effect. Vascular: No hyperdense vessel or unexpected calcification. Skull: Normal. Negative for fracture or focal lesion. Sinuses/Orbits: A 10 mm left maxillary sinus polyp versus mucous retention cyst is seen. A chronic appearing deformity is noted involving the lamina papyracea on the right. Other: None. IMPRESSION: No acute intracranial abnormality. Electronically Signed   By: Virgle Grime M.D.   On: 05/12/2024 22:10   DG Chest Port 1 View Result Date: 05/12/2024 CLINICAL DATA:  Chest pain. EXAM: PORTABLE CHEST 1 VIEW COMPARISON:  Apr 10, 2022 FINDINGS: The heart size and mediastinal contours are within normal limits. Both lungs are clear. The visualized skeletal structures are unremarkable. IMPRESSION: No active disease. Electronically Signed   By: Virgle Grime M.D.   On: 05/12/2024 21:51     Procedures   Medications Ordered in the ED  metoCLOPramide  (REGLAN ) injection 10 mg (has  no administration in time range)  diphenhydrAMINE  (BENADRYL ) injection 25 mg (has no administration in time range)  ketorolac  (TORADOL ) 15 MG/ML injection 15 mg (has no administration in time range)                                    Medical Decision Making Amount and/or Complexity of Data Reviewed Labs: ordered. Decision-making details documented in ED Course. Radiology: ordered and independent interpretation performed. Decision-making details documented in ED Course. ECG/medicine tests: ordered and independent interpretation performed. Decision-making details documented in ED Course.  Risk Prescription drug management.   Left-sided headache and facial numbness onset last night.  Does have slightly decreased grip strength on the left but otherwise neuro intact.  Low suspicion for CVA or TIA.  EKG is sinus rhythm without acute ST changes.  Troponin negative x 2 with low suspicion for ACS or  PE D-dimer negative.   Left-sided headache and facial numbness with concern for possibly complicated migraine.  Low suspicion for CVA or TIA.  Not have any chest pain currently.  EKG is sinus rhythm.  Troponin negative x 2 in triage.  CT head obtained in triage is negative for hemorrhage or obvious infarct.  Results reviewed and interpreted by me.  MRI is obtained this is negative for acute CVA.  CTA negative for aneurysm or vascular stenosis. CT venogram negative for venous thrombosis.  Additional medications given for headache.  Still complaining of some left-sided facial numbness.  Discussed her reassuring results.  Will need follow-up with neurology.  Anticipate improvement in symptoms and possible discharge at shift change.     Final diagnoses:  None    ED Discharge Orders     None          Tonie Elsey, Mara [place], MD 05/13/24 [phone]

## 2024-05-13 NOTE — ED Notes (Signed)
 Patient provided with discharge paperwork. Pt demonstrated understanding of material. All questions, comments, and concerns addressed. Pt ambulated in hallway towards exit with no complications

## 2024-05-13 NOTE — ED Notes (Signed)
 Nt called CCMD@4 :42a

## 2024-06-04 ENCOUNTER — Encounter: Payer: Self-pay | Admitting: Neurology

## 2024-06-04 ENCOUNTER — Ambulatory Visit: Payer: MEDICAID | Admitting: Neurology

## 2024-06-06 ENCOUNTER — Ambulatory Visit (HOSPITAL_COMMUNITY)
Admission: EM | Admit: 2024-06-06 | Discharge: 2024-06-06 | Disposition: A | Discharge status: Other Acute Inpatient Hospital | Payer: MEDICAID

## 2024-06-06 ENCOUNTER — Other Ambulatory Visit: Payer: Self-pay

## 2024-06-06 ENCOUNTER — Inpatient Hospital Stay (HOSPITAL_COMMUNITY)
Admission: AD | Admit: 2024-06-06 | Discharge: 2024-06-12 | DRG: 885 | Disposition: A | Discharge status: Home / Self Care | Payer: MEDICAID | Source: Intra-hospital | Attending: Psychiatry | Admitting: Psychiatry

## 2024-06-06 ENCOUNTER — Encounter (HOSPITAL_COMMUNITY): Payer: Self-pay

## 2024-06-06 DIAGNOSIS — F332 Major depressive disorder, recurrent severe without psychotic features: Secondary | ICD-10-CM | POA: Diagnosis present

## 2024-06-06 DIAGNOSIS — F4310 Post-traumatic stress disorder, unspecified: Secondary | ICD-10-CM | POA: Diagnosis present

## 2024-06-06 DIAGNOSIS — F410 Panic disorder [episodic paroxysmal anxiety] without agoraphobia: Secondary | ICD-10-CM | POA: Diagnosis present

## 2024-06-06 DIAGNOSIS — F15129 Other stimulant abuse with intoxication, unspecified: Secondary | ICD-10-CM | POA: Diagnosis present

## 2024-06-06 DIAGNOSIS — Z79899 Other long term (current) drug therapy: Secondary | ICD-10-CM

## 2024-06-06 DIAGNOSIS — Z634 Disappearance and death of family member: Secondary | ICD-10-CM | POA: Diagnosis not present

## 2024-06-06 DIAGNOSIS — R519 Headache, unspecified: Secondary | ICD-10-CM | POA: Diagnosis present

## 2024-06-06 DIAGNOSIS — R45850 Homicidal ideations: Secondary | ICD-10-CM | POA: Diagnosis present

## 2024-06-06 DIAGNOSIS — F603 Borderline personality disorder: Secondary | ICD-10-CM | POA: Diagnosis present

## 2024-06-06 DIAGNOSIS — F319 Bipolar disorder, unspecified: Secondary | ICD-10-CM | POA: Diagnosis present

## 2024-06-06 DIAGNOSIS — R45851 Suicidal ideations: Secondary | ICD-10-CM

## 2024-06-06 DIAGNOSIS — Z608 Other problems related to social environment: Secondary | ICD-10-CM | POA: Diagnosis present

## 2024-06-06 DIAGNOSIS — Z9152 Personal history of nonsuicidal self-harm: Secondary | ICD-10-CM | POA: Diagnosis not present

## 2024-06-06 DIAGNOSIS — G4700 Insomnia, unspecified: Secondary | ICD-10-CM | POA: Diagnosis present

## 2024-06-06 DIAGNOSIS — Z62810 Personal history of physical and sexual abuse in childhood: Secondary | ICD-10-CM

## 2024-06-06 DIAGNOSIS — Z98891 History of uterine scar from previous surgery: Secondary | ICD-10-CM

## 2024-06-06 DIAGNOSIS — F6089 Other specific personality disorders: Secondary | ICD-10-CM | POA: Diagnosis present

## 2024-06-06 DIAGNOSIS — F1010 Alcohol abuse, uncomplicated: Secondary | ICD-10-CM | POA: Diagnosis present

## 2024-06-06 DIAGNOSIS — Z9151 Personal history of suicidal behavior: Secondary | ICD-10-CM

## 2024-06-06 DIAGNOSIS — F17290 Nicotine dependence, other tobacco product, uncomplicated: Secondary | ICD-10-CM | POA: Diagnosis present

## 2024-06-06 DIAGNOSIS — E876 Hypokalemia: Secondary | ICD-10-CM | POA: Diagnosis not present

## 2024-06-06 DIAGNOSIS — S069XAS Unspecified intracranial injury with loss of consciousness status unknown, sequela: Secondary | ICD-10-CM | POA: Diagnosis not present

## 2024-06-06 DIAGNOSIS — I10 Essential (primary) hypertension: Secondary | ICD-10-CM | POA: Diagnosis present

## 2024-06-06 DIAGNOSIS — Z555 Less than a high school diploma: Secondary | ICD-10-CM

## 2024-06-06 DIAGNOSIS — Z638 Other specified problems related to primary support group: Secondary | ICD-10-CM

## 2024-06-06 LAB — COMPREHENSIVE METABOLIC PANEL WITH GFR
ALT: 19 U/L (ref 0–44)
AST: 18 U/L (ref 15–41)
Albumin: 3.5 g/dL (ref 3.5–5.0)
Alkaline Phosphatase: 74 U/L (ref 38–126)
Anion gap: 9 (ref 5–15)
BUN: 11 mg/dL (ref 6–20)
CO2: 25 mmol/L (ref 22–32)
Calcium: 9.2 mg/dL (ref 8.9–10.3)
Chloride: 106 mmol/L (ref 98–111)
Creatinine, Ser: 0.8 mg/dL (ref 0.44–1.00)
GFR, Estimated: >60 mL/min (ref >60)
Glucose, Bld: 104 mg/dL — ABNORMAL HIGH (ref 70–99)
Potassium: 3.4 mmol/L — ABNORMAL LOW (ref 3.5–5.1)
Sodium: 140 mmol/L (ref 135–145)
Total Bilirubin: 0.6 mg/dL (ref 0.0–1.2)
Total Protein: 6.6 g/dL (ref 6.5–8.1)

## 2024-06-06 LAB — TSH: TSH: 1.089 u[IU]/mL (ref 0.350–4.500)

## 2024-06-06 LAB — CBC WITH DIFFERENTIAL/PLATELET
Abs Immature Granulocytes: 0.04 K/uL (ref 0.00–0.07)
Basophils Absolute: 0 K/uL (ref 0.0–0.1)
Basophils Relative: 0 %
Eosinophils Absolute: 0.2 K/uL (ref 0.0–0.5)
Eosinophils Relative: 3 %
HCT: 46.6 % — ABNORMAL HIGH (ref 36.0–46.0)
Hemoglobin: 14.8 g/dL (ref 12.0–15.0)
Immature Granulocytes: 1 %
Lymphocytes Relative: 25 %
Lymphs Abs: 2 K/uL (ref 0.7–4.0)
MCH: 27.6 pg (ref 26.0–34.0)
MCHC: 31.8 g/dL (ref 30.0–36.0)
MCV: 86.9 fL (ref 80.0–100.0)
Monocytes Absolute: 0.4 K/uL (ref 0.1–1.0)
Monocytes Relative: 5 %
Neutro Abs: 5.2 K/uL (ref 1.7–7.7)
Neutrophils Relative %: 66 %
Platelets: 272 K/uL (ref 150–400)
RBC: 5.36 MIL/uL — ABNORMAL HIGH (ref 3.87–5.11)
RDW: 14 % (ref 11.5–15.5)
WBC: 7.8 K/uL (ref 4.0–10.5)
nRBC: 0 % (ref 0.0–0.2)

## 2024-06-06 LAB — URINALYSIS, ROUTINE W REFLEX MICROSCOPIC
Bilirubin Urine: NEGATIVE
Glucose, UA: NEGATIVE mg/dL
Ketones, ur: NEGATIVE mg/dL
Nitrite: POSITIVE — AB
Protein, ur: NEGATIVE mg/dL
Specific Gravity, Urine: 1.025 (ref 1.005–1.030)
WBC, UA: >50 WBC/hpf (ref 0–5)
pH: 5 (ref 5.0–8.0)

## 2024-06-06 LAB — POCT URINE DRUG SCREEN - MANUAL ENTRY (I-SCREEN)
POC Amphetamine UR: POSITIVE — AB
POC Buprenorphine (BUP): NOT DETECTED
POC Cocaine UR: NOT DETECTED
POC Marijuana UR: POSITIVE — AB
POC Methadone UR: NOT DETECTED
POC Methamphetamine UR: POSITIVE — AB
POC Morphine: NOT DETECTED
POC Oxazepam (BZO): NOT DETECTED
POC Oxycodone UR: NOT DETECTED
POC Secobarbital (BAR): NOT DETECTED

## 2024-06-06 MED ORDER — LORAZEPAM 2 MG/ML IJ SOLN: 2.0000 mg | Freq: Three times a day (TID) | INTRAMUSCULAR | Status: DC | PRN

## 2024-06-06 MED ORDER — POTASSIUM CHLORIDE CRYS ER 10 MEQ PO TBCR
10.0000 meq | EXTENDED_RELEASE_TABLET | Freq: Once | ORAL | Status: DC
  Administered 2024-06-06: 10 meq via ORAL
  Filled 2024-06-06: qty 1

## 2024-06-06 MED ORDER — HALOPERIDOL 5 MG PO TABS: 5.0000 mg | ORAL_TABLET | Freq: Three times a day (TID) | ORAL | Status: DC | PRN

## 2024-06-06 MED ORDER — MAGNESIUM HYDROXIDE 400 MG/5ML PO SUSP: 30.0000 mL | Freq: Every day | ORAL | Status: DC | PRN

## 2024-06-06 MED ORDER — ACETAMINOPHEN 325 MG PO TABS
650.0000 mg | ORAL_TABLET | Freq: Four times a day (QID) | ORAL | Status: DC | PRN
  Administered 2024-06-06 – 2024-06-10 (×5): 650 mg via ORAL
  Filled 2024-06-06 (×5): qty 2

## 2024-06-06 MED ORDER — HYDROXYZINE HCL 25 MG PO TABS
25.0000 mg | ORAL_TABLET | Freq: Three times a day (TID) | ORAL | Status: DC | PRN
Start: 2024-06-06 — End: 2024-06-12
  Administered 2024-06-06 – 2024-06-11 (×6): 25 mg via ORAL
  Filled 2024-06-06 (×6): qty 1

## 2024-06-06 MED ORDER — NITROFURANTOIN MONOHYD MACRO 100 MG PO CAPS
100.0000 mg | ORAL_CAPSULE | Freq: Two times a day (BID) | ORAL | Status: DC
  Administered 2024-06-06: 100 mg via ORAL
  Filled 2024-06-06: qty 1

## 2024-06-06 MED ORDER — HYDROXYZINE HCL 25 MG PO TABS: 25.0000 mg | ORAL_TABLET | Freq: Three times a day (TID) | ORAL | Status: DC | PRN

## 2024-06-06 MED ORDER — ALUM & MAG HYDROXIDE-SIMETH 200-200-20 MG/5ML PO SUSP: 30.0000 mL | ORAL | Status: DC | PRN

## 2024-06-06 MED ORDER — DIPHENHYDRAMINE HCL 50 MG/ML IJ SOLN: 50.0000 mg | Freq: Three times a day (TID) | INTRAMUSCULAR | Status: DC | PRN

## 2024-06-06 MED ORDER — DIPHENHYDRAMINE HCL 25 MG PO CAPS: 50.0000 mg | ORAL_CAPSULE | Freq: Three times a day (TID) | ORAL | Status: DC | PRN

## 2024-06-06 MED ORDER — TRAZODONE HCL 50 MG PO TABS: 50.0000 mg | ORAL_TABLET | Freq: Every evening | ORAL | Status: DC | PRN

## 2024-06-06 MED ORDER — HALOPERIDOL LACTATE 5 MG/ML IJ SOLN: 10.0000 mg | Freq: Three times a day (TID) | INTRAMUSCULAR | Status: DC | PRN

## 2024-06-06 MED ORDER — HALOPERIDOL 5 MG PO TABS
5.0000 mg | ORAL_TABLET | Freq: Three times a day (TID) | ORAL | Status: DC | PRN
Start: 2024-06-06 — End: 2024-06-06

## 2024-06-06 MED ORDER — NICOTINE 14 MG/24HR TD PT24
14.0000 mg | MEDICATED_PATCH | Freq: Every day | TRANSDERMAL | Status: DC
  Administered 2024-06-07 – 2024-06-11 (×5): 14 mg via TRANSDERMAL
  Filled 2024-06-06 (×4): qty 1

## 2024-06-06 MED ORDER — NITROFURANTOIN MONOHYD MACRO 100 MG PO CAPS
100.0000 mg | ORAL_CAPSULE | Freq: Two times a day (BID) | ORAL | Status: AC
  Administered 2024-06-06 – 2024-06-10 (×9): 100 mg via ORAL
  Filled 2024-06-06 (×9): qty 1

## 2024-06-06 MED ORDER — NICOTINE POLACRILEX 2 MG MT GUM
2.0000 mg | CHEWING_GUM | OROMUCOSAL | Status: DC | PRN
  Administered 2024-06-07 – 2024-06-08 (×3): 2 mg via ORAL
  Filled 2024-06-06 (×3): qty 1

## 2024-06-06 MED ORDER — HALOPERIDOL LACTATE 5 MG/ML IJ SOLN: 5.0000 mg | Freq: Three times a day (TID) | INTRAMUSCULAR | Status: DC | PRN

## 2024-06-06 MED ORDER — NICOTINE 14 MG/24HR TD PT24: 14.0000 mg | MEDICATED_PATCH | Freq: Every day | TRANSDERMAL | Status: DC

## 2024-06-06 MED ORDER — POTASSIUM CHLORIDE CRYS ER 10 MEQ PO TBCR
10.0000 meq | EXTENDED_RELEASE_TABLET | Freq: Once | ORAL | Status: DC
  Filled 2024-06-06: qty 1

## 2024-06-06 MED ORDER — ACETAMINOPHEN 325 MG PO TABS
650.0000 mg | ORAL_TABLET | Freq: Four times a day (QID) | ORAL | Status: DC | PRN
  Administered 2024-06-06: 650 mg via ORAL
  Filled 2024-06-06: qty 2

## 2024-06-06 MED ORDER — DIPHENHYDRAMINE HCL 50 MG PO CAPS: 50.0000 mg | ORAL_CAPSULE | Freq: Three times a day (TID) | ORAL | Status: DC | PRN

## 2024-06-06 NOTE — Plan of Care (Signed)
  Problem: Education: Goal: Knowledge of [facility] Education information/materials will improve 06/06/2024 2221 by Cecille Dan CROME, RN Outcome: Progressing 06/06/2024 2221 by Cecille Dan CROME, RN Outcome: Progressing Goal: Emotional status will improve 06/06/2024 2221 by Cecille Dan CROME, RN Outcome: Progressing 06/06/2024 2221 by Cecille Dan CROME, RN Outcome: Progressing Goal: Mental status will improve 06/06/2024 2221 by Cecille Dan CROME, RN Outcome: Progressing 06/06/2024 2221 by Cecille Dan CROME, RN Outcome: Progressing Goal: Verbalization of understanding the information provided will improve 06/06/2024 2221 by Cecille Dan CROME, RN Outcome: Progressing 06/06/2024 2221 by Cecille Dan CROME, RN Outcome: Progressing   Problem: Activity: Goal: Interest or engagement in activities will improve 06/06/2024 2221 by Cecille Dan CROME, RN Outcome: Progressing 06/06/2024 2221 by Cecille Dan CROME, RN Outcome: Progressing
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
## 2024-06-06 NOTE — Tx Team (Signed)
 Initial Treatment Plan 06/06/2024 6:31 PM Alexandra Gray [id number]    PATIENT STRESSORS: Marital or family conflict   Medication change or noncompliance   Traumatic event     PATIENT STRENGTHS: Ability for insight  Motivation for treatment/growth    PATIENT IDENTIFIED PROBLEMS: Depression  Anxiety  Med non compliance  PTSD               DISCHARGE CRITERIA:  Improved stabilization in mood, thinking, and/or behavior Verbal commitment to aftercare and medication compliance  PRELIMINARY DISCHARGE PLAN: Outpatient therapy Return to previous living arrangement  PATIENT/FAMILY INVOLVEMENT: This treatment plan has been presented to and reviewed with the patient, Alexandra Gray.The patient has been given the opportunity to ask questions and make suggestions.  Kalsey Lull, RN 06/06/2024, 6:31 PM

## 2024-06-06 NOTE — Group Note (Signed)
 Date:  06/06/2024 Time:  8:12 PM  Group Topic/Focus:  Wrap-Up Group:   The focus of this group is to help patients review their daily goal of treatment and discuss progress on daily workbooks.    Participation Level:  Active  Participation Quality:  Appropriate  Affect:  Appropriate  Cognitive:  Appropriate  Insight: Appropriate  Engagement in Group:  Engaged  Modes of Intervention:  Discussion  Additional Comments:  Pt participated in AA and wrap up group  Alexandra Gray 06/06/2024, 8:12 PM
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
## 2024-06-06 NOTE — Progress Notes (Signed)
 Pt has been accepted to [facility] on 06/06/2024 .   Pt meets inpatient criteria per  Odis Cleveland, MD   Attending Physician will be Dr. Prentis   Report can be called to: -Adult unit: [phone]  Pt can arrive after ASAP  Care Team Notified: [facility] Burnard Barter, RN, Beulah Guan, LPN, Odis Cleveland, MD

## 2024-06-06 NOTE — Progress Notes (Signed)
 Pt is admitted to [facility] due to SI to overdose on pills. Pt currently denies and verbally contracts for safety on the unit. Pt was advised to notify staff when having intrusive thoughts of hurting self or others. Pt verbalized understanding. Pt is alert and oriented X4 with flat affect. Pt is ambulatory and is oriented to staff/unit. Pt was cooperative with labs and skin assessment. Pt complained of headache and received PRN Acetaminophen  per order. Staff will monitor for pt's safety.
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
## 2024-06-06 NOTE — Discharge Instructions (Addendum)
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
## 2024-06-06 NOTE — ED Provider Notes (Signed)
 UA resulted: Positive nitrites, large leukocytes, many bacteria with cloudy appearance and small hemoglobin.  RBC WNL.  Patient notes some right-sided flank pain in the afternoons, with orange-colored urine.  Denies fever, chills, nausea, vomiting.  Patient is afebrile at this time.  We will start nitrofurantoin  100 mg twice daily for 5 days.  Continue to monitor for signs of fever/suspicion for pyelonephritis, which will require transfer to the emergency department for evaluation/management.  UDS resulted: Positive for amphetamine, methamphetamine, THC.  Patient says she took an E pill a couple of days ago.  Ecstasy is often laced with methamphetamine.  No acute concerns related to this at this time.  Discussed with Corean Potters, MD, attending physician, who agrees with above plan.  Odis Cleveland, MD PGY2, [facility] Psychiatry Res[facility]

## 2024-06-07 LAB — HEMOGLOBIN A1C
Hgb A1c MFr Bld: 5.2 % (ref 4.8–5.6)
Mean Plasma Glucose: 103 mg/dL

## 2024-06-07 LAB — BASIC METABOLIC PANEL WITH GFR
Anion gap: 6 (ref 5–15)
BUN: 9 mg/dL (ref 6–20)
CO2: 26 mmol/L (ref 22–32)
Calcium: 8.7 mg/dL — ABNORMAL LOW (ref 8.9–10.3)
Chloride: 104 mmol/L (ref 98–111)
Creatinine, Ser: 0.77 mg/dL (ref 0.44–1.00)
GFR, Estimated: >60 mL/min (ref >60)
Glucose, Bld: 92 mg/dL (ref 70–99)
Potassium: 3.8 mmol/L (ref 3.5–5.1)
Sodium: 136 mmol/L (ref 135–145)

## 2024-06-07 MED ORDER — DIVALPROEX SODIUM ER 500 MG PO TB24
500.0000 mg | ORAL_TABLET | Freq: Every day | ORAL | Status: DC
  Administered 2024-06-07 – 2024-06-08 (×2): 500 mg via ORAL
  Filled 2024-06-07 (×2): qty 1

## 2024-06-07 MED ORDER — SERTRALINE HCL 50 MG PO TABS
50.0000 mg | ORAL_TABLET | Freq: Every day | ORAL | Status: DC
  Administered 2024-06-07 – 2024-06-12 (×6): 50 mg via ORAL
  Filled 2024-06-07 (×6): qty 1

## 2024-06-07 NOTE — Progress Notes (Signed)
   06/07/24 2151  Psych Admission Type (Psych Patients Only)  Admission Status Voluntary  Psychosocial Assessment  Patient Complaints Depression  Eye Contact Brief  Facial Expression Flat  Affect Appropriate to circumstance  Speech Logical/coherent  Interaction Minimal  Motor Activity Other (Comment) (WDL)  Appearance/Hygiene In scrubs  Behavior Characteristics Appropriate to situation  Mood Depressed;Pleasant  Thought Process  Coherency WDL  Content WDL  Delusions None reported or observed  Perception WDL  Hallucination None reported or observed  Judgment Limited  Confusion None  Danger to Self  Current suicidal ideation? Denies  Self-Injurious Behavior No self-injurious ideation or behavior indicators observed or expressed   Agreement Not to Harm Self Yes  Description of Agreement verbal  Danger to Others  Danger to Others None reported or observed
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
## 2024-06-07 NOTE — Progress Notes (Addendum)
 Pt c/o anxiety and experiencing periods of crying while thinking about the death of her father and unborn child several yrs ago, this is a hard time for me, it's close to the their anniversary death Pt reports passive SI but no plan or intent and committed to safety. Pt OOR, engaging with peers at times and she is cooperative with taking her medications.

## 2024-06-07 NOTE — Plan of Care (Signed)
  Problem: Education: Goal: Emotional status will improve Outcome: Progressing Goal: Verbalization of understanding the information provided will improve Outcome: Progressing   Problem: Activity: Goal: Interest or engagement in activities will improve Outcome: Progressing   Problem: Coping: Goal: Ability to verbalize frustrations and anger appropriately will improve Outcome: Progressing Goal: Ability to demonstrate self-control will improve Outcome: Progressing

## 2024-06-07 NOTE — BHH Group Notes (Signed)
 BHH Group Notes:  (Nursing/MHT/Case Management/Adjunct)  Date:  06/07/2024  Time:  9:08 PM  Type of Therapy:  Wrap-up group  Participation Level:  Active  Participation Quality:  Appropriate  Affect:  Appropriate  Cognitive:  Appropriate  Insight:  Appropriate  Engagement in Group:  Engaged  Modes of Intervention:  Education  Summary of Progress/Problems: Goal to manage triggers. Rated day 6/10.  Alexandra Gray [place] 06/07/2024, 9:08 PM

## 2024-06-07 NOTE — BHH Group Notes (Signed)
 The focus of this group is to help patients establish daily goals to achieve during treatment and discuss how the patient can incorporate goal setting into their daily lives to aide in recovery.      Scale 1-10  5   Goals: Stay positive and talk with kids

## 2024-06-07 NOTE — Progress Notes (Signed)
 LCSW Wellness Group Note   06/07/2024 2:00pm  Type of Group and Topic: Psychoeducational Group:  Wellness  Participation Level:  Active  Description of Group  Wellness group introduces the topic and its focus on developing healthy habits across the spectrum and its relationship to a decrease in hospital admissions.  Six areas of wellness are discussed: physical, social spiritual, intellectual, occupational, and emotional.  Patients are asked to consider their current wellness habits and to identify areas of wellness where they are interested and able to focus on improvements.    Therapeutic Goals Patients will understand components of wellness and how they can positively impact overall health.  Patients will identify areas of wellness where they have developed good habits. Patients will identify areas of wellness where they would like to make improvements.    Summary of Patient Progress: pt active in group discussion, made several comments.  Pt identified financial as wellness area of strength and emotional as a wellness area that need improvement.  Pt mentioned trauma history and difficulty controlling emotions when triggered.      Therapeutic Modalities: Cognitive Behavioral Therapy Psychoeducation    Bridget Cordella Simmonds, LCSW
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
## 2024-06-07 NOTE — Progress Notes (Signed)
   06/07/24 1000  Psych Admission Type (Psych Patients Only)  Admission Status Voluntary  Psychosocial Assessment  Patient Complaints Depression  Eye Contact Brief  Facial Expression Sad  Affect Depressed  Speech Logical/coherent  Interaction Minimal  Motor Activity  (WDL)  Appearance/Hygiene In scrubs  Behavior Characteristics Cooperative;Appropriate to situation  Mood Sad  Thought Process  Coherency WDL  Content WDL  Delusions None reported or observed  Perception WDL  Hallucination None reported or observed  Judgment Limited  Confusion None  Danger to Self  Current suicidal ideation? Passive  Agreement Not to Harm Self Yes  Description of Agreement verbal

## 2024-06-08 MED ORDER — CLONIDINE HCL 0.1 MG PO TABS
0.1000 mg | ORAL_TABLET | Freq: Once | ORAL | Status: AC
  Administered 2024-06-08: 0.1 mg via ORAL
  Filled 2024-06-08: qty 1

## 2024-06-08 NOTE — Plan of Care (Signed)
   Problem: Education: Goal: Emotional status will improve Outcome: Progressing   Problem: Education: Goal: Verbalization of understanding the information provided will improve Outcome: Progressing

## 2024-06-08 NOTE — Plan of Care (Signed)
 Nurse discussed anxiety, depression and coping skills with patient.

## 2024-06-08 NOTE — Progress Notes (Signed)
   06/08/24 0528  BH Safety Checks  Location Bedroom  Visual Appearance Calm  Behavior Sleeping  Sleep (Behavioral Health Patients Only)  Calculate sleep? (Click Yes once per 24 hr at 0600 safety check) Yes  Documented sleep last 24 hours 7.25

## 2024-06-08 NOTE — Progress Notes (Signed)
 D:  Patient's self inventory sheet, patient has fair sleep, no sleep medication.  Fair appetite, low energy level, good concentration.  Rated depression 5, anxiety 3, denied hopeless.  Denied withdrawals.  Denied SI.  Denied physical problems.  Denied physical pain.  Goal is stay focused.  Plans to talk to staff.  No discharge plans. A:  Medications administered per MD orders.  Emotional support and encouragement given patient. R:  Denied SI and HI, contracts for safety.  Denied A/V hallucinations.  Safety maintained with 15 minute checks.

## 2024-06-08 NOTE — Progress Notes (Signed)
   06/08/24 2140  Psych Admission Type (Psych Patients Only)  Admission Status Voluntary  Psychosocial Assessment  Patient Complaints Sleep disturbance  Eye Contact Brief  Facial Expression Animated  Affect Appropriate to circumstance  Speech Logical/coherent  Interaction Minimal  Motor Activity Other (Comment) (WDL)  Appearance/Hygiene In scrubs  Behavior Characteristics Appropriate to situation  Mood Pleasant  Thought Process  Coherency WDL  Content WDL  Delusions None reported or observed  Perception WDL  Hallucination None reported or observed  Judgment Limited  Confusion None  Danger to Self  Current suicidal ideation? Denies  Self-Injurious Behavior No self-injurious ideation or behavior indicators observed or expressed   Agreement Not to Harm Self Yes  Description of Agreement verbal  Danger to Others  Danger to Others None reported or observed

## 2024-06-08 NOTE — Progress Notes (Signed)
 Adult Psychoeducational Group Note  Date:  06/08/2024 Time:  10:09 PM  Group Topic/Focus:  Wrap-Up Group:   The focus of this group is to help patients review their daily goal of treatment and discuss progress on daily workbooks.  Participation Level:  Active  Participation Quality:  Appropriate  Affect:  Appropriate  Cognitive:  Appropriate  Insight: Appropriate  Engagement in Group:  Engaged  Modes of Intervention:  Discussion  Additional Comments:  Alexandra Gray said the one positive take away she learning to control her triggers, limits. She has a lot angry due to grief feeling. She found talking to friends, coloring.The patients and staff has help her.  Lang Drilling Long 06/08/2024, 10:09 PM

## 2024-06-08 NOTE — Progress Notes (Signed)
 [facility] MD Progress Note  06/08/2024 10:43 AM Alexandra Gray  MRN:  [id number]  Principal Problem: Suicidal ideation Diagnosis: Principal Problem:   Suicidal ideation   [name] is a 33 year old female with a psychiatric history of major depressive disorder, multiple suicide attempts via OD, recorded cluster B traits, panic disorder, posttraumatic stress disorder, alcohol use disorder, cannabis use, nicotine  use disorder, who admitted to [facility] due to active suicidal ideation with plan to overdose on medications.   Interval History Patient was seen today for re-evaluation.  Nursing reports no events overnight. The patient has no issues with performing ADLs.  Patient has been medication compliant.    Patient was seen and interviewed by attending psychiatrist. Chart reviewed. Patient discussed during treatment team rounds.  Subjective: On assessment, the patient reports, I feel the same, indicating no significant change in her mood or mental state since the last evaluation. She describes persistent symptoms of depression, including feelings of hopelessness, lack of motivation, and a general sense of unhappiness. Although she continues to experience passive thoughts about death, she denies any active suicidal ideation, intent, or plan. The patient notes that she has been feeling groggy and excessively sleepy during the day, which she attributes to her current medications. However, she denies experiencing any other side effects. She reports improvement in sleep quality and duration since starting treatment. Appetite remains stable and appropriate for her baseline.   Labs: no new results for review.    Total Time spent with patient: 30 minutes  Past Psychiatric History: see H&P  Past Medical History:  Past Medical History:  Diagnosis Date   Cesarean delivery delivered 01/04/2019   Chlamydia    Chronic hypertension during pregnancy, antepartum 07/30/2018   BP elevated 12/21, 01/29/18,  05/11/18, 07/29/18  Galerius.Gant ] Aspirin  81 mg daily after 12 weeks Current antihypertensives:  None   Baseline and surveillance labs (pulled in from [facility], refresh links as needed)  Lab Results  Component Value Date   PLT 218 07/29/2018   CREATININE 0.59 07/29/2018   AST 16 07/29/2018   ALT 15 07/29/2018              Antenatal Testing CHTN - O10.919  Group I  BP < 140/90, no preecl   Depression    doing ok now   Gonorrhea    H/O: C-section 01/04/2019   H/O: C-section 01/04/2019   History of cesarean delivery affecting pregnancy 10/03/2015   x1 in 2016, currently plans RLTCS  Declines TOLAC. Form signed 09/20/2018    History of cesarean delivery affecting pregnancy 10/03/2015   x1 in 2016, currently plans RLTCS  Declines TOLAC. Form signed 09/20/2018    History of cesarean delivery affecting pregnancy 10/03/2015   x1 in 2016, currently plans RLTCS  Declines TOLAC. Form signed 09/20/2018    History of pregnancy induced hypertension 12/06/2016   Hx of trichomoniasis    Hypertension    sometimes is up, never on meds   Mental disorder    Ovarian cyst    PID (pelvic inflammatory disease)    Sleep apnea     Past Surgical History:  Procedure Laterality Date   CESAREAN SECTION N/A 10/03/2015   Procedure: CESAREAN SECTION;  Surgeon: Norleen Edsel GAILS, MD;  Location: [facility] ORS;  Service: Obstetrics;  Laterality: N/A;   CESAREAN SECTION N/A 01/04/2019   Procedure: CESAREAN SECTION;  Surgeon: Lorence Ozell CROME, MD;  Location: [facility] BIRTHING SUITES;  Service: Obstetrics;  Laterality: N/A;   CESAREAN SECTION N/A  Phreesia 03/08/2021   CHOLECYSTECTOMY N/A 06/23/2023   Procedure: LAPAROSCOPIC CHOLECYSTECTOMY WITH INTRAOPERATIVE CHOLANGIOGRAM;  Surgeon: Dasie Leonor CROME, MD;  Location: [facility] OR;  Service: General;  Laterality: N/A;   KNEE SURGERY     Family History:  Family History  Problem Relation Age of Onset   Healthy Mother    Healthy Father    Healthy Brother    Cancer Neg Hx    Diabetes Neg Hx    Heart disease  Neg Hx    Hypertension Neg Hx    Stroke Neg Hx    Hearing loss Neg Hx    Family Psychiatric  History: see H&P Social History:  Social History   Substance and Sexual Activity  Alcohol Use Yes   Comment: 10 weekly     Social History   Substance and Sexual Activity  Drug Use Yes   Types: Marijuana   Comment: 1/8 weekly    Social History   Socioeconomic History   Marital status: Single    Spouse name: Not on file   Number of children: 2   Years of education: 9   Highest education level: Not on file  Occupational History   Occupation: disabled  Tobacco Use   Smoking status: Every Day    Types: Cigars   Smokeless tobacco: Never  Vaping Use   Vaping status: Never Used  Substance and Sexual Activity   Alcohol use: Yes    Comment: 10 weekly   Drug use: Yes    Types: Marijuana    Comment: 1/8 weekly   Sexual activity: Not Currently    Birth control/protection: None  Other Topics Concern   Not on file  Social History Narrative   Lives alone in a one story home.  Has one child.     On disability for depression and PTSD.     Education: 9th grade.    She is in school for criminal justice.   Social Drivers of Corporate investment banker Strain: Low Risk  (12/24/2018)   Overall Financial Resource Strain (CARDIA)    Difficulty of Paying Living Expenses: Not hard at all  Food Insecurity: No Food Insecurity (06/06/2024)   Hunger Vital Sign    Worried About Running Out of Food in the Last Year: Never true    Ran Out of Food in the Last Year: Never true  Transportation Needs: No Transportation Needs (06/06/2024)   PRAPARE - Administrator, Civil Service (Medical): No    Lack of Transportation (Non-Medical): No  Physical Activity: Not on file  Stress: No Stress Concern Present (12/24/2018)   [employer] of Occupational Health - Occupational Stress Questionnaire    Feeling of Stress : Not at all  Social Connections: Not on file   Additional Social History:                          Sleep: Good Estimated Sleeping Duration (Last 24 Hours): 5.00-7.00 hours  Appetite:  Fair  Current Medications: Current Facility-Administered Medications  Medication Dose Route Frequency Provider Last Rate Last Admin   acetaminophen  (TYLENOL ) tablet 650 mg  650 mg Oral Q6H PRN Rollene Katz, MD   650 mg at 06/07/24 1645   alum & mag hydroxide-simeth (MAALOX/MYLANTA) 200-200-20 MG/5ML suspension 30 mL  30 mL Oral Q4H PRN Rollene Katz, MD       haloperidol  (HALDOL ) tablet 5 mg  5 mg Oral TID PRN Rollene Katz, MD  And   diphenhydrAMINE  (BENADRYL ) capsule 50 mg  50 mg Oral TID PRN Rollene Katz, MD       haloperidol  lactate (HALDOL ) injection 5 mg  5 mg Intramuscular TID PRN Rollene Katz, MD       And   diphenhydrAMINE  (BENADRYL ) injection 50 mg  50 mg Intramuscular TID PRN Rollene Katz, MD       And   LORazepam  (ATIVAN ) injection 2 mg  2 mg Intramuscular TID PRN Rollene Katz, MD       haloperidol  lactate (HALDOL ) injection 10 mg  10 mg Intramuscular TID PRN Rollene Katz, MD       And   diphenhydrAMINE  (BENADRYL ) injection 50 mg  50 mg Intramuscular TID PRN Rollene Katz, MD       And   LORazepam  (ATIVAN ) injection 2 mg  2 mg Intramuscular TID PRN Rollene Katz, MD       divalproex  (DEPAKOTE  ER) 24 hr tablet 500 mg  500 mg Oral QHS Green Quincy, MD   500 mg at 06/07/24 2059   hydrOXYzine  (ATARAX ) tablet 25 mg  25 mg Oral TID PRN Rollene Katz, MD   25 mg at 06/08/24 0801   magnesium  hydroxide (MILK OF MAGNESIA) suspension 30 mL  30 mL Oral Daily PRN Rollene Katz, MD       nicotine  (NICODERM CQ  - dosed in mg/24 hours) patch 14 mg  14 mg Transdermal Q0600 Rollene Katz, MD   14 mg at 06/08/24 0800   nicotine  polacrilex (NICORETTE ) gum 2 mg  2 mg Oral PRN Prentis Kitchens A, DO   2 mg at 06/07/24 2059   nitrofurantoin  (macrocrystal-monohydrate) (MACROBID ) capsule 100 mg  100 mg Oral  BID Rollene Katz, MD   100 mg at 06/08/24 0800   potassium chloride  (KLOR-CON  M) CR tablet 10 mEq  10 mEq Oral Once Rollene Katz, MD       sertraline  (ZOLOFT ) tablet 50 mg  50 mg Oral Daily Raja Liska, MD   50 mg at 06/08/24 0801    Lab Results:  Results for orders placed or performed during the hospital encounter of 06/06/24 (from the past 48 hours)  Basic metabolic panel     Status: Abnormal   Collection Time: 06/07/24  6:48 AM  Result Value Ref Range   Sodium 136 135 - 145 mmol/L   Potassium 3.8 3.5 - 5.1 mmol/L   Chloride 104 98 - 111 mmol/L   CO2 26 22 - 32 mmol/L   Glucose, Bld 92 70 - 99 mg/dL    Comment: Glucose reference range applies only to samples taken after fasting for at least 8 hours.   BUN 9 6 - 20 mg/dL   Creatinine, Ser 9.22 0.44 - 1.00 mg/dL   Calcium 8.7 (L) 8.9 - 10.3 mg/dL   GFR, Estimated >39 >39 mL/min    Comment: (NOTE) Calculated using the CKD-EPI Creatinine Equation (2021)    Anion gap 6 5 - 15    Comment: Performed at [facility], [address] [place], [place] [address]    Blood Alcohol level:  Lab Results  Component Value Date   [facility] <10 06/21/2023   ETH <10 12/04/2021    Metabolic Disorder Labs: Lab Results  Component Value Date   HGBA1C 5.2 06/06/2024   MPG 103 06/06/2024   MPG 105.41 12/08/2021   No results found for: PROLACTIN Lab Results  Component Value Date   CHOL 159 12/08/2021   TRIG 54 06/21/2023   HDL 34 (  L) 12/08/2021   CHOLHDL 4.7 12/08/2021   VLDL 35 12/08/2021   LDLCALC 90 12/08/2021   LDLCALC 116 (H) 06/29/2021    Physical Findings: AIMS:  ,  ,  ,  ,  ,  ,   CIWA:    COWS:     Musculoskeletal: Strength & Muscle Tone: within normal limits Gait & Station: normal Patient leans: N/A  Psychiatric Specialty Exam:  Presentation  General Appearance:  Casual  Eye Contact: Good; Fleeting  Speech: Clear and Coherent (tearful/crying at points)  Speech  Volume: Normal  Handedness:No data recorded  Mood and Affect  Mood: Depressed; Worthless  Affect: Congruent; Depressed; Tearful   Thought Process  Thought Processes: Coherent; Goal Directed; Linear  Descriptions of Associations:Intact  Orientation:Full (Time, Place and Person)  Thought Content:Logical  History of Schizophrenia/Schizoaffective disorder:No  Duration of Psychotic Symptoms:No data recorded Hallucinations:No data recorded Ideas of Reference:None  Suicidal Thoughts:No data recorded Homicidal Thoughts:No data recorded  Sensorium  Memory: Immediate Fair (grossly intact)  Judgment: Good  Insight: Fair   Art therapist  Concentration: Fair (grossly intact)  Attention Span: Fair (grossly intact)  Recall: Fair (grossly intact)  Fund of Knowledge: Fair (grossly intact)  Language: Fair; Good   Psychomotor Activity  Psychomotor Activity:No data recorded  Assets  Assets: Communication Skills; Desire for Improvement; Housing; Social Support   Sleep  Sleep:No data recorded   Physical Exam: Physical Exam ROS Blood pressure 127/76, pulse 72, temperature 98.1 F (36.7 C), temperature source Oral, resp. rate 20, height 5' (1.524 m), weight 130.3 kg, SpO2 100%. Body mass index is 56.09 kg/m.   Treatment Plan Summary: Daily contact with patient to assess and evaluate symptoms and progress in treatment and Medication management  Patient is a 33 year old female/female with the above-stated past psychiatric history who is seen in follow-up.  Chart reviewed. Patient discussed with nursing. Patient continues feeling depressed; no active suicidality today.  Diagnoses/ Active problems: Major depressive disorder, recurrent, severe, without psychosis.   PLAN:  Medications:  continue Zoloft  50 mg PO daily for treatment of depression and PTSD symptoms. continue Depakote  500 mg PO at bedtime as mood stabilizer, given emotional lability. Liver  function tests reviewed today and are within normal limits (WNL); safe to initiate Depakote .   PRN medications: acetaminophen , alum & mag hydroxide-simeth, haloperidol  **AND** diphenhydrAMINE , haloperidol  lactate **AND** diphenhydrAMINE  **AND** LORazepam , haloperidol  lactate **AND** diphenhydrAMINE  **AND** LORazepam , hydrOXYzine , magnesium  hydroxide, nicotine  polacrilex  - Encourage patient to participate in unit milieu and in scheduled group therapies    - Encourage patient to refrain from cannabis and tobacco use; provide education and resources as appropriate.   -Disposition will be determined after the patient is stabilized. -Social work and case management to assist with discharge planning and identification of hospital follow-up needs prior to discharge -Estimated LOS: 7 days -Discharge Concerns: Need to establish a safety plan; Medication compliance and effectiveness -Discharge Goals: Return home with outpatient referrals for mental health follow-up including medication management/psychotherapy    Total Time Spent in Direct Patient Care:  I personally spent 35 minutes on the unit in direct patient care. The direct patient care time included face-to-face time with the patient, reviewing the patient's chart, communicating with other professionals, and coordinating care. Greater than 50% of this time was spent in counseling or coordinating care with the patient regarding goals of hospitalization, psycho-education, and discharge planning needs.   Neil Appl, MD 06/08/2024, 10:43 AM

## 2024-06-09 ENCOUNTER — Encounter (HOSPITAL_COMMUNITY): Payer: Self-pay

## 2024-06-09 MED ORDER — ARIPIPRAZOLE 2 MG PO TABS
2.0000 mg | ORAL_TABLET | Freq: Every day | ORAL | Status: DC
  Administered 2024-06-09 – 2024-06-12 (×4): 2 mg via ORAL
  Filled 2024-06-09 (×4): qty 1

## 2024-06-09 NOTE — Progress Notes (Signed)
 [facility] MD Progress Note  06/09/2024 1:29 PM Alexandra Gray  MRN:  [id number] Subjective:   33 year old African-American female, single, lives with her cousin, unemployed.  Background history of MDD recurrent, PTSD and borderline personality disorder.  Self-presented on account of worsening depression associated with suicidal thoughts.  Patient was very tearful at presentation.  She reported multiple psychosocial stressors. Routine labs significant for hypokalemia.  UDS was positive for methamphetamine and THC.  I assumed care of this patient today.  Chart reviewed today.  Patient discussed at multidisciplinary team meeting.  Nursing staff reports that patient has been appropriate on the unit.  No challenging behavior.  She has been adherent with her medications.  As needed hydroxyzine  required.  At interview with patient, she reports multiple bereavement.  States that her mother passed last 2024/08/24 and her father died on the 06/20/24 of last month.  States that she was feeling down in the dumps.  She was taking ecstasy from the streets to help boost her mood.  Patient acknowledged interpersonal relational issues as another stressor for her.  States that she felt let down by people.  Taking care of her two children was also stressful.  Her children lives with her godmother but she gets to see them frequently.  Patient states that she is feeling a lot better now that she is back on medication.  Sertraline  has helped her in the past.  Patient states that she had elevated liver enzymes on Depakote  in the past.  We have agreed to switch it to aripiprazole  which has been tolerated in the past.  Patient is not endorsing any current suicidal thoughts.  Patient is not endorsing any rageful thoughts towards others or to property.  She is not endorsing any abnormal perception.  She is not endorsing any abnormal belief.  She is future oriented. Encouraged to keep ventilating her feelings to staff.   Principal  Problem: Severe episode of recurrent major depressive disorder, without psychotic features (HCC) Diagnosis: Principal Problem:   Severe episode of recurrent major depressive disorder, without psychotic features (HCC) Active Problems:   Suicidal ideation   Cluster B personality disorder (HCC)  Total Time spent with patient: 30 minutes  Past Psychiatric History:  See H&P  Past Medical History:  Past Medical History:  Diagnosis Date   Cesarean delivery delivered 01/04/2019   Chlamydia    Chronic hypertension during pregnancy, antepartum 07/30/2018   BP elevated 12/21, 01/29/18, 05/11/18, 07/29/18  Galerius.Gant ] Aspirin  81 mg daily after 12 weeks Current antihypertensives:  None   Baseline and surveillance labs (pulled in from [facility] refresh links as needed)  Lab Results  Component Value Date   PLT 218 07/29/2018   CREATININE 0.59 07/29/2018   AST 16 07/29/2018   ALT 15 07/29/2018              Antenatal Testing CHTN - O10.919  Group I  BP < 140/90, no preecl   Depression    doing ok now   Gonorrhea    H/O: C-section 01/04/2019   H/O: C-section 01/04/2019   History of cesarean delivery affecting pregnancy 10/03/2015   x1 in 2016, currently plans RLTCS  Declines TOLAC. Form signed 09/20/2018    History of cesarean delivery affecting pregnancy 10/03/2015   x1 in 2016, currently plans RLTCS  Declines TOLAC. Form signed 09/20/2018    History of cesarean delivery affecting pregnancy 10/03/2015   x1 in 2016, currently plans RLTCS  Declines TOLAC. Form signed 09/20/2018    History  of pregnancy induced hypertension 12/06/2016   Hx of trichomoniasis    Hypertension    sometimes is up, never on meds   Mental disorder    Ovarian cyst    PID (pelvic inflammatory disease)    Sleep apnea     Past Surgical History:  Procedure Laterality Date   CESAREAN SECTION N/A 10/03/2015   Procedure: CESAREAN SECTION;  Surgeon: Norleen Edsel GAILS, MD;  Location: [facility] ORS;  Service: Obstetrics;  Laterality: N/A;   CESAREAN  SECTION N/A 01/04/2019   Procedure: CESAREAN SECTION;  Surgeon: Lorence Ozell CROME, MD;  Location: [facility];  Service: Obstetrics;  Laterality: N/A;   CESAREAN SECTION N/A    Phreesia 03/08/2021   CHOLECYSTECTOMY N/A 06/23/2023   Procedure: LAPAROSCOPIC CHOLECYSTECTOMY WITH INTRAOPERATIVE CHOLANGIOGRAM;  Surgeon: Dasie Leonor CROME, MD;  Location: [facility] OR;  Service: General;  Laterality: N/A;   KNEE SURGERY     Family History:  Family History  Problem Relation Age of Onset   Healthy Mother    Healthy Father    Healthy Brother    Cancer Neg Hx    Diabetes Neg Hx    Heart disease Neg Hx    Hypertension Neg Hx    Stroke Neg Hx    Hearing loss Neg Hx    Family Psychiatric  History:  See H&P  Social History:  Social History   Substance and Sexual Activity  Alcohol Use Yes   Comment: 10 weekly     Social History   Substance and Sexual Activity  Drug Use Yes   Types: Marijuana   Comment: 1/8 weekly    Social History   Socioeconomic History   Marital status: Single    Spouse name: Not on file   Number of children: 2   Years of education: 9   Highest education level: Not on file  Occupational History   Occupation: disabled  Tobacco Use   Smoking status: Every Day    Types: Cigars   Smokeless tobacco: Never  Vaping Use   Vaping status: Never Used  Substance and Sexual Activity   Alcohol use: Yes    Comment: 10 weekly   Drug use: Yes    Types: Marijuana    Comment: 1/8 weekly   Sexual activity: Not Currently    Birth control/protection: None  Other Topics Concern   Not on file  Social History Narrative   Lives alone in a one story home.  Has one child.     On disability for depression and PTSD.     Education: 9th grade.    She is in school for criminal justice.   Social Drivers of Corporate investment banker Strain: Low Risk  (12/24/2018)   Overall Financial Resource Strain (CARDIA)    Difficulty of Paying Living Expenses: Not hard at all  Food  Insecurity: No Food Insecurity (06/06/2024)   Hunger Vital Sign    Worried About Running Out of Food in the Last Year: Never true    Ran Out of Food in the Last Year: Never true  Transportation Needs: No Transportation Needs (06/06/2024)   PRAPARE - Administrator, Civil Service (Medical): No    Lack of Transportation (Non-Medical): No  Physical Activity: Not on file  Stress: No Stress Concern Present (12/24/2018)   [employer] of Occupational Health - Occupational Stress Questionnaire    Feeling of Stress : Not at all  Social Connections: Not on file    Current Medications: Current  Facility-Administered Medications  Medication Dose Route Frequency Provider Last Rate Last Admin   acetaminophen  (TYLENOL ) tablet 650 mg  650 mg Oral Q6H PRN Rollene Katz, MD   650 mg at 06/08/24 1314   alum & mag hydroxide-simeth (MAALOX/MYLANTA) 200-200-20 MG/5ML suspension 30 mL  30 mL Oral Q4H PRN Rollene Katz, MD       haloperidol  (HALDOL ) tablet 5 mg  5 mg Oral TID PRN Rollene Katz, MD       And   diphenhydrAMINE  (BENADRYL ) capsule 50 mg  50 mg Oral TID PRN Rollene Katz, MD       haloperidol  lactate (HALDOL ) injection 5 mg  5 mg Intramuscular TID PRN Rollene Katz, MD       And   diphenhydrAMINE  (BENADRYL ) injection 50 mg  50 mg Intramuscular TID PRN Rollene Katz, MD       And   LORazepam  (ATIVAN ) injection 2 mg  2 mg Intramuscular TID PRN Rollene Katz, MD       haloperidol  lactate (HALDOL ) injection 10 mg  10 mg Intramuscular TID PRN Rollene Katz, MD       And   diphenhydrAMINE  (BENADRYL ) injection 50 mg  50 mg Intramuscular TID PRN Rollene Katz, MD       And   LORazepam  (ATIVAN ) injection 2 mg  2 mg Intramuscular TID PRN Rollene Katz, MD       divalproex  (DEPAKOTE  ER) 24 hr tablet 500 mg  500 mg Oral QHS Paliy, Alisa, MD   500 mg at 06/08/24 2134   hydrOXYzine  (ATARAX ) tablet 25 mg  25 mg Oral TID PRN Rollene Katz, MD    25 mg at 06/08/24 2134   magnesium  hydroxide (MILK OF MAGNESIA) suspension 30 mL  30 mL Oral Daily PRN Rollene Katz, MD       nicotine  (NICODERM CQ  - dosed in mg/24 hours) patch 14 mg  14 mg Transdermal Q0600 Rollene Katz, MD   14 mg at 06/09/24 9180   nicotine  polacrilex (NICORETTE ) gum 2 mg  2 mg Oral PRN Prentis Kitchens A, DO   2 mg at 06/08/24 1704   nitrofurantoin  (macrocrystal-monohydrate) (MACROBID ) capsule 100 mg  100 mg Oral BID Rollene Katz, MD   100 mg at 06/09/24 9180   potassium chloride  (KLOR-CON  M) CR tablet 10 mEq  10 mEq Oral Once Rollene Katz, MD       sertraline  (ZOLOFT ) tablet 50 mg  50 mg Oral Daily Paliy, Alisa, MD   50 mg at 06/09/24 9180    Lab Results: No results found for this or any previous visit (from the past 48 hours).  Blood Alcohol level:  Lab Results  Component Value Date   ETH <10 06/21/2023   ETH <10 12/04/2021    Metabolic Disorder Labs: Lab Results  Component Value Date   HGBA1C 5.2 06/06/2024   MPG 103 06/06/2024   MPG 105.41 12/08/2021   No results found for: PROLACTIN Lab Results  Component Value Date   CHOL 159 12/08/2021   TRIG 54 06/21/2023   HDL 34 (L) 12/08/2021   CHOLHDL 4.7 12/08/2021   VLDL 35 12/08/2021   LDLCALC 90 12/08/2021   LDLCALC 116 (H) 06/29/2021    Physical Findings: AIMS:  ,  ,  ,  ,  ,  ,   CIWA:    COWS:     Musculoskeletal: Strength & Muscle Tone: within normal limits Gait & Station: normal Patient leans: N/A  Psychiatric Specialty Exam:  Presentation  General Appearance:  Casually  dressed, overweight, not in any distress, appropriate behavior, engaged politely.  No EPS.  Eye Contact: Good.  Speech: Spontaneous.  Not pressured, not loud.  Mood and Affect  Mood: Subjectively and objectively better.  Affect: Restricted and appropriate.  Thought Process  Thought Processes: Linear and goal directed.  Descriptions of Associations:Intact  Orientation:Full (Time,  Place and Person)  Thought Content: Less negative ruminations.  No guilty rumination.  No current suicidal thoughts.  No homicidal thoughts.  No thoughts of violence.  Future-oriented.  No delusional theme.  No obsessions.  Hallucinations: No hallucination in any modality.  Sensorium  Memory: Good.  Judgment: Good.  Insight: Good  Executive Functions  Concentration: Good.  Attention Span: Good.  Recall: Good.  Fund of Knowledge: Good.  Language: Good   Psychomotor Activity  Improving psychomotor activity  Physical Exam: Physical Exam ROS Blood pressure (!) 139/101, pulse 67, temperature (!) 97.5 F (36.4 C), resp. rate 20, height 5' (1.524 m), weight 130.3 kg, SpO2 100%. Body mass index is 56.09 kg/m.   Treatment Plan Summary: Patient presented with worsening depression in the context of grief and interpersonal relational issues.  She has an extensive history of self-injurious behavior.  She was suicidal at presentation.  She was intoxicated with methamphetamine.  Patient is back on an antidepressant as she is feeling a lot better.  We will make adjustments as above, we will gather collateral from family and evaluate her further.  1.  Continue sertraline  50 mg daily. 2.  Discontinue Depakote  due to previous elevated LFTs from this medication. 3.  Initiate aripiprazole  2 mg daily. 4.  Continue to encourage unit groups and therapeutic activities. 5.  Continue to monitor mood behavior and interaction with others. 6.  Social worker will obtain collateral from family. 7.  Social worker will coordinate discharge and aftercare planning.   Jerrell DELENA Forehand, MD 06/09/2024, 1:30 PM

## 2024-06-09 NOTE — Group Note (Signed)
 Occupational Therapy Group Note  Group Topic: Sleep Hygiene  Group Date: 06/09/2024 Start Time: 1425 End Time: 1455 Facilitators: Dot [place] MATSU, OT   Group Description: Group encouraged increased participation and engagement through topic focused on sleep hygiene. Patients reflected on the quality of sleep they typically receive and identified areas that need improvement. Group was given background information on sleep and sleep hygiene, including common sleep disorders. Group members also received information on how to improve one's sleep and introduced a sleep diary as a tool that can be utilized to track sleep quality over a length of time. Group session ended with patients identifying one or more strategies they could utilize or implement into their sleep routine in order to improve overall sleep quality.        Therapeutic Goal(s):  Identify one or more strategies to improve overall sleep hygiene  Identify one or more areas of sleep that are negatively impacted (sleep too much, too little, etc)     Participation Level: Engaged   Participation Quality: Independent   Behavior: Appropriate   Speech/Thought Process: Relevant   Affect/Mood: Appropriate   Insight: Fair   Judgement: Fair      Modes of Intervention: Education  Patient Response to Interventions:  Attentive   Plan: Continue to engage patient in OT groups 2 - 3x/week.  06/09/2024  [place] MATSU Dot, OT  Alexandra Gray, OT

## 2024-06-09 NOTE — Progress Notes (Signed)
   06/09/24 0515  BH Safety Checks  Location Bedroom  Visual Appearance Calm  Behavior Sleeping  Sleep (Behavioral Health Patients Only)  Calculate sleep? (Click Yes once per 24 hr at 0600 safety check) Yes  Documented sleep last 24 hours 8

## 2024-06-09 NOTE — [facility] Group Notes (Signed)
 The focus of this group is to help patients review their daily goal of treatment and discuss progress on daily workbooks. Pt was attentive and appropriate during tonight's wrap up group with aa.

## 2024-06-09 NOTE — Plan of Care (Signed)
   Problem: Education: Goal: Emotional status will improve Outcome: Progressing   Problem: Activity: Goal: Interest or engagement in activities will improve Outcome: Progressing

## 2024-06-09 NOTE — [facility] Group Notes (Signed)
 Spiritual care group on grief and loss facilitated by Chaplain Rockie Sofia, [facility]  Group Goal: Support / Education around grief and loss  Members engage in facilitated group support and psycho-social education.  Group Description:  Following introductions and group rules, group members engaged in facilitated group dialogue and support around topic of loss, with particular support around experiences of loss in their lives. Group Identified types of loss (relationships / self / things) and identified patterns, circumstances, and changes that precipitate losses. Reflected on thoughts / feelings around loss, normalized grief responses, and recognized variety in grief experience. Group encouraged individual reflection on safe space and on the coping skills that they are already utilizing.  Group drew on Adlerian / Rogerian and narrative framework  Patient Progress: Alexandra Gray attended group and actively engaged and participated in group conversation and activities.  She has had many losses in recent years including siblings, both parents and a baby as well as the father of her daughter. She was tearful, but was able to stay in the group and continue to participate.  Her comments demonstrated good insight and contributed positively to the group conversation.

## 2024-06-09 NOTE — Group Note (Signed)
 Recreation Therapy Group Note   Group Topic:Problem Solving  Group Date: 06/09/2024 Start Time: 0930 End Time: 1000 Facilitators: Raaga Maeder-McCall, LRT,CTRS Location: [address] Dayroom   Group Topic: Problem Solving  Goal Area(s) Addresses:  Patient will effectively work in a team with other group members. Patient will verbalize importance of using appropriate problem solving techniques.  Patient will identify positive change associated with effective problem solving skills.   Behavioral Response: Engaged  Intervention: Worksheet, Music  Activity: Science writer. Patients were given a front and back worksheet of brain teasers. Patients worked together to figure out each individual puzzle presented on sheet. Patients had 20 minutes to complete the brain teasers. LRT went over the answers with patients at the end.    Education: Problem Solving, Communication, Cognitive Skills  Education Outcome: Acknowledges understanding/In group clarification offered/Needs additional education.    Affect/Mood: Appropriate   Participation Level: Engaged   Participation Quality: Independent   Behavior: Appropriate   Speech/Thought Process: Focused   Insight: Good   Judgement: Good   Modes of Intervention: Group work   Patient Response to Interventions:  Engaged   Education Outcome:  In group clarification offered    Clinical Observations/Individualized Feedback: Pt was bright and engaged during group session. Pt was focused and worked well with peers in completing the activity.    Plan: Continue to engage patient in RT group sessions 2-3x/week.   Alexandra Gray, LRT,CTRS  06/09/2024 12:20 PM

## 2024-06-09 NOTE — BH IP Treatment Plan (Signed)
 Interdisciplinary Treatment and Diagnostic Plan Update  06/09/2024 Time of Session: 1155 Alexandra Gray MRN: [id number]  Principal Diagnosis: Severe episode of recurrent major depressive disorder, without psychotic features (HCC)  Secondary Diagnoses: Principal Problem:   Severe episode of recurrent major depressive disorder, without psychotic features (HCC) Active Problems:   Suicidal ideation   Cluster B personality disorder (HCC)   Current Medications:  Current Facility-Administered Medications  Medication Dose Route Frequency Provider Last Rate Last Admin   acetaminophen  (TYLENOL ) tablet 650 mg  650 mg Oral Q6H PRN Rollene Katz, MD   650 mg at 06/08/24 1314   alum & mag hydroxide-simeth (MAALOX/MYLANTA) 200-200-20 MG/5ML suspension 30 mL  30 mL Oral Q4H PRN Rollene Katz, MD       ARIPiprazole  (ABILIFY ) tablet 2 mg  2 mg Oral Daily Izediuno, Jerrell LABOR, MD       haloperidol  (HALDOL ) tablet 5 mg  5 mg Oral TID PRN Rollene Katz, MD       And   diphenhydrAMINE  (BENADRYL ) capsule 50 mg  50 mg Oral TID PRN Rollene Katz, MD       haloperidol  lactate (HALDOL ) injection 5 mg  5 mg Intramuscular TID PRN Rollene Katz, MD       And   diphenhydrAMINE  (BENADRYL ) injection 50 mg  50 mg Intramuscular TID PRN Rollene Katz, MD       And   LORazepam  (ATIVAN ) injection 2 mg  2 mg Intramuscular TID PRN Rollene Katz, MD       haloperidol  lactate (HALDOL ) injection 10 mg  10 mg Intramuscular TID PRN Rollene Katz, MD       And   diphenhydrAMINE  (BENADRYL ) injection 50 mg  50 mg Intramuscular TID PRN Rollene Katz, MD       And   LORazepam  (ATIVAN ) injection 2 mg  2 mg Intramuscular TID PRN Rollene Katz, MD       hydrOXYzine  (ATARAX ) tablet 25 mg  25 mg Oral TID PRN Rollene Katz, MD   25 mg at 06/08/24 2134   magnesium  hydroxide (MILK OF MAGNESIA) suspension 30 mL  30 mL Oral Daily PRN Rollene Katz, MD       nicotine  (NICODERM CQ   - dosed in mg/24 hours) patch 14 mg  14 mg Transdermal Q0600 Rollene Katz, MD   14 mg at 06/09/24 9180   nicotine  polacrilex (NICORETTE ) gum 2 mg  2 mg Oral PRN Prentis Kitchens A, DO   2 mg at 06/08/24 1704   nitrofurantoin  (macrocrystal-monohydrate) (MACROBID ) capsule 100 mg  100 mg Oral BID Rollene Katz, MD   100 mg at 06/09/24 9180   potassium chloride  (KLOR-CON  M) CR tablet 10 mEq  10 mEq Oral Once Rollene Katz, MD       sertraline  (ZOLOFT ) tablet 50 mg  50 mg Oral Daily Paliy, Alisa, MD   50 mg at 06/09/24 9180   PTA Medications: Medications Prior to Admission  Medication Sig Dispense Refill Last Dose/Taking   cyclobenzaprine  (FLEXERIL ) 10 MG tablet Take 0.5-1 tablets (5-10 mg total) by mouth 3 (three) times daily as needed. (Patient taking differently: Take 5-10 mg by mouth 3 (three) times daily as needed for muscle spasms.) 30 tablet 0     Patient Stressors: Marital or family conflict   Medication change or noncompliance   Traumatic event    Patient Strengths: Ability for insight  Motivation for treatment/growth   Treatment Modalities: Medication Management, Group therapy, Case management,  1 to 1 session with clinician, Psychoeducation, Recreational therapy.  Physician Treatment Plan for Primary Diagnosis: Severe episode of recurrent major depressive disorder, without psychotic features (HCC) Long Term Goal(s): Improvement in symptoms so as ready for discharge   Short Term Goals: Ability to identify changes in lifestyle to reduce recurrence of condition will improve Ability to verbalize feelings will improve Ability to disclose and discuss suicidal ideas Ability to demonstrate self-control will improve Ability to identify and develop effective coping behaviors will improve Ability to maintain clinical measurements within normal limits will improve Compliance with prescribed medications will improve Ability to identify triggers associated with substance  abuse/mental health issues will improve  Medication Management: Evaluate patient's response, side effects, and tolerance of medication regimen.  Therapeutic Interventions: 1 to 1 sessions, Unit Group sessions and Medication administration.  Evaluation of Outcomes: Not Progressing  Physician Treatment Plan for Secondary Diagnosis: Principal Problem:   Severe episode of recurrent major depressive disorder, without psychotic features (HCC) Active Problems:   Suicidal ideation   Cluster B personality disorder (HCC)  Long Term Goal(s): Improvement in symptoms so as ready for discharge   Short Term Goals: Ability to identify changes in lifestyle to reduce recurrence of condition will improve Ability to verbalize feelings will improve Ability to disclose and discuss suicidal ideas Ability to demonstrate self-control will improve Ability to identify and develop effective coping behaviors will improve Ability to maintain clinical measurements within normal limits will improve Compliance with prescribed medications will improve Ability to identify triggers associated with substance abuse/mental health issues will improve     Medication Management: Evaluate patient's response, side effects, and tolerance of medication regimen.  Therapeutic Interventions: 1 to 1 sessions, Unit Group sessions and Medication administration.  Evaluation of Outcomes: Not Progressing   RN Treatment Plan for Primary Diagnosis: Severe episode of recurrent major depressive disorder, without psychotic features (HCC) Long Term Goal(s): Knowledge of disease and therapeutic regimen to maintain health will improve  Short Term Goals: Ability to remain free from injury will improve, Ability to verbalize frustration and anger appropriately will improve, Ability to demonstrate self-control, Ability to participate in decision making will improve, Ability to verbalize feelings will improve, Ability to disclose and discuss  suicidal ideas, Ability to identify and develop effective coping behaviors will improve, and Compliance with prescribed medications will improve  Medication Management: RN will administer medications as ordered by provider, will assess and evaluate patient's response and provide education to patient for prescribed medication. RN will report any adverse and/or side effects to prescribing provider.  Therapeutic Interventions: 1 on 1 counseling sessions, Psychoeducation, Medication administration, Evaluate responses to treatment, Monitor vital signs and CBGs as ordered, Perform/monitor CIWA, COWS, AIMS and Fall Risk screenings as ordered, Perform wound care treatments as ordered.  Evaluation of Outcomes: Not Progressing   LCSW Treatment Plan for Primary Diagnosis: Severe episode of recurrent major depressive disorder, without psychotic features (HCC) Long Term Goal(s): Safe transition to appropriate next level of care at discharge, Engage patient in therapeutic group addressing interpersonal concerns.  Short Term Goals: Engage patient in aftercare planning with referrals and resources, Increase social support, Increase ability to appropriately verbalize feelings, Increase emotional regulation, Facilitate acceptance of mental health diagnosis and concerns, Facilitate patient progression through stages of change regarding substance use diagnoses and concerns, Identify triggers associated with mental health/substance abuse issues, and Increase skills for wellness and recovery  Therapeutic Interventions: Assess for all discharge needs, 1 to 1 time with Social worker, Explore available resources and support systems, Assess for adequacy in community support network, Educate  family and significant other(s) on suicide prevention, Complete Psychosocial Assessment, Interpersonal group therapy.  Evaluation of Outcomes: Not Progressing   Progress in Treatment: Attending groups: Yes. Participating in groups:  Yes. Taking medication as prescribed: Yes. Toleration medication: Yes. Family/Significant other contact made: No, will contact:  Dannial Alstrom [phone] Patient understands diagnosis: Yes. Discussing patient identified problems/goals with staff: Yes. Medical problems stabilized or resolved: Yes. Denies suicidal/homicidal ideation: Yes. Issues/concerns per patient self-inventory: No. Other: n/a  New problem(s) identified: No, Describe:  None  New Short Term/Long Term Goal(s): medication stabilization, elimination of SI thoughts, development of comprehensive mental wellness plan.   Patient Goals:  Help me with my depression  Discharge Plan or Barriers: Patient recently admitted. CSW will continue to follow and assess for appropriate referrals and possible discharge planning.    Reason for Continuation of Hospitalization: Anxiety Depression Medication stabilization  Estimated Length of Stay: 3-5 DAYS  Last 3 [place] Suicide Severity Risk Score: Flowsheet Row Ad[facility] (Current) [facility] Most recent reading at 06/06/2024  5:21 PM ED from 06/06/2024 in [facility] Most recent reading at 06/06/2024 12:07 PM ED from 05/12/2024 in [facility] Emergency Department at [facility] Most recent reading at 05/12/2024  9:26 PM  C-SSRS RISK CATEGORY Low Risk High Risk No Risk    Last PHQ 2/9 Scores:    12/04/2021    4:17 PM 11/03/2020    2:56 PM 01/02/2019    8:52 AM  Depression screen PHQ 2/9  Decreased Interest 2 3 0  Down, Depressed, Hopeless 2 3 0  PHQ - 2 Score 4 6 0  Altered sleeping 3 3 0  Tired, decreased energy 1 3 0  Change in appetite 0 3 0  Feeling bad or failure about yourself  3 3 0  Trouble concentrating 1 3 0  Moving slowly or fidgety/restless 0 3 0  Suicidal thoughts 2 3 0  PHQ-9 Score 14 27 0  Difficult doing work/chores Somewhat difficult      Scribe for Treatment  Team: Donnajean Chesnut N Evin Chirco, LCSW 06/09/2024 4:25 PM

## 2024-06-09 NOTE — Progress Notes (Signed)
   06/09/24 0800  Psych Admission Type (Psych Patients Only)  Admission Status Voluntary  Psychosocial Assessment  Patient Complaints Sleep disturbance  Eye Contact Brief  Facial Expression Animated  Affect Appropriate to circumstance  Speech Logical/coherent  Interaction Minimal  Motor Activity Other (Comment) (WDL)  Appearance/Hygiene In scrubs  Behavior Characteristics Cooperative;Appropriate to situation  Mood Pleasant  Thought Process  Coherency WDL  Content WDL  Delusions None reported or observed  Perception WDL  Hallucination None reported or observed  Judgment Limited  Confusion None  Danger to Self  Current suicidal ideation? Denies  Self-Injurious Behavior No self-injurious ideation or behavior indicators observed or expressed   Agreement Not to Harm Self Yes  Description of Agreement verbal  Danger to Others  Danger to Others None reported or observed

## 2024-06-09 NOTE — Plan of Care (Signed)
  Problem: Activity: Goal: Interest or engagement in activities will improve Outcome: Progressing   Problem: Physical Regulation: Goal: Ability to maintain clinical measurements within normal limits will improve Outcome: Progressing   Problem: Safety: Goal: Periods of time without injury will increase Outcome: Progressing

## 2024-06-09 NOTE — Progress Notes (Signed)
   06/09/24 2000  Psych Admission Type (Psych Patients Only)  Admission Status Voluntary  Psychosocial Assessment  Patient Complaints Sleep disturbance  Eye Contact Brief  Facial Expression Animated  Affect Appropriate to circumstance  Speech Logical/coherent  Interaction Minimal  Motor Activity Other (Comment) (WDL)  Appearance/Hygiene In scrubs  Behavior Characteristics Cooperative;Appropriate to situation  Mood Pleasant  Thought Process  Coherency WDL  Content WDL  Delusions None reported or observed  Perception WDL  Hallucination None reported or observed  Judgment Limited  Confusion None  Danger to Self  Current suicidal ideation? Denies  Agreement Not to Harm Self Yes  Description of Agreement verbal  Danger to Others  Danger to Others None reported or observed

## 2024-06-10 MED ORDER — TRAZODONE HCL 50 MG PO TABS
50.0000 mg | ORAL_TABLET | Freq: Every day | ORAL | Status: DC
  Administered 2024-06-11: 50 mg via ORAL
  Filled 2024-06-10: qty 1

## 2024-06-10 NOTE — Progress Notes (Addendum)
 D. Pt presented drowsy upon approach this morning, reported poor sleep last night- stated her goal today was to stay awake by attending groups. Pt described her appetite and concentration as 'poor', and energy level as 'low'. Per pt's self inventory, pt rated her depression, hopelessness and anxiety a 4/3/2, respectively. Pt has been visible in the milieu, observed attending groups.Pt currently denies SI/HI and AVH and doesn't appear to be responding to internal stimuli.  A. Labs and vitals monitored. Pt given and educated on medications. Pt supported emotionally and encouraged to express concerns and ask questions.   R. Pt remains safe with 15 minute checks. Will continue POC.    06/10/24 0900  Psych Admission Type (Psych Patients Only)  Admission Status Voluntary  Psychosocial Assessment  Patient Complaints Sleep disturbance  Eye Contact Brief  Facial Expression Animated  Affect Appropriate to circumstance  Speech Logical/coherent  Interaction Minimal  Motor Activity Other (Comment) (level 3 observation)  Appearance/Hygiene In scrubs  Behavior Characteristics Cooperative;Appropriate to situation  Mood Pleasant  Thought Process  Coherency WDL  Content WDL  Delusions None reported or observed  Perception WDL  Hallucination None reported or observed  Judgment Limited  Confusion None  Danger to Self  Current suicidal ideation? Denies  Self-Injurious Behavior No self-injurious ideation or behavior indicators observed or expressed   Danger to Others  Danger to Others None reported or observed

## 2024-06-10 NOTE — Progress Notes (Signed)
 [facility] MD Progress Note  06/10/2024 2:48 PM Alexandra Gray  MRN:  [id number] Subjective:   33 year old African-American female, single, lives with her cousin, unemployed.  Background history of MDD recurrent, PTSD and borderline personality disorder.  Self-presented on account of worsening depression associated with suicidal thoughts.  Patient was very tearful at presentation.  She reported multiple psychosocial stressors. Routine labs significant for hypokalemia.  UDS was positive for methamphetamine and THC.  Chart reviewed today.  Patient discussed at multidisciplinary team meeting.  Nursing staff reports that patient slept for 7.75 hours.  Patient however complained of poor quality of sleep.  States that she has been feeling tired this morning.  She has been staying up and participating in groups.  No challenging behavior.  She has been adherent with her medications.  No collateral yet from Child psychotherapist.  Seen today.  Patient states that she is doing better mentally.  States that other than poor sleep last night she does not have any other concerns or worries.  She feels like her mood is lifting appropriately.  She has not had any thoughts of suicide lately.  Patient is not endorsing any manic symptoms.  There are no symptoms of activation.  No psychotic symptoms.  States that she has not made any contact with her cousin. We discussed use of trazodone  as a sleep aid.  Patient consented after we reviewed the risks and benefits.  Encouraged to keep ventilating her feelings to staff.   Principal Problem: Severe episode of recurrent major depressive disorder, without psychotic features (HCC) Diagnosis: Principal Problem:   Severe episode of recurrent major depressive disorder, without psychotic features (HCC) Active Problems:   Suicidal ideation   Cluster B personality disorder (HCC)  Total Time spent with patient: 30 minutes  Past Psychiatric History:  See H&P  Past Medical History:   Past Medical History:  Diagnosis Date   Cesarean delivery delivered 01/04/2019   Chlamydia    Chronic hypertension during pregnancy, antepartum 07/30/2018   BP elevated 12/21, 01/29/18, 05/11/18, 07/29/18  Galerius.Gant ] Aspirin  81 mg daily after 12 weeks Current antihypertensives:  None   Baseline and surveillance labs (pulled in from [facility], refresh links as needed)  Lab Results  Component Value Date   PLT 218 07/29/2018   CREATININE 0.59 07/29/2018   AST 16 07/29/2018   ALT 15 07/29/2018              Antenatal Testing CHTN - O10.919  Group I  BP < 140/90, no preecl   Depression    doing ok now   Gonorrhea    H/O: C-section 01/04/2019   H/O: C-section 01/04/2019   History of cesarean delivery affecting pregnancy 10/03/2015   x1 in 2016, currently plans RLTCS  Declines TOLAC. Form signed 09/20/2018    History of cesarean delivery affecting pregnancy 10/03/2015   x1 in 2016, currently plans RLTCS  Declines TOLAC. Form signed 09/20/2018    History of cesarean delivery affecting pregnancy 10/03/2015   x1 in 2016, currently plans RLTCS  Declines TOLAC. Form signed 09/20/2018    History of pregnancy induced hypertension 12/06/2016   Hx of trichomoniasis    Hypertension    sometimes is up, never on meds   Mental disorder    Ovarian cyst    PID (pelvic inflammatory disease)    Sleep apnea     Past Surgical History:  Procedure Laterality Date   CESAREAN SECTION N/A 10/03/2015   Procedure: CESAREAN SECTION;  Surgeon: Norleen Edsel GAILS,  MD;  Location: [facility] ORS;  Service: Obstetrics;  Laterality: N/A;   CESAREAN SECTION N/A 01/04/2019   Procedure: CESAREAN SECTION;  Surgeon: Lorence Ozell CROME, MD;  Location: [facility] SU[facility];  Service: Obstetrics;  Laterality: N/A;   CESAREAN SECTION N/A    Phreesia 03/08/2021   CHOLECYSTECTOMY N/A 06/23/2023   Procedure: LAPAROSCOPIC CHOLECYSTECTOMY WITH INTRAOPERATIVE CHOLANGIOGRAM;  Surgeon: Dasie Leonor CROME, MD;  Location: [facility] OR;  Service: General;  Laterality: N/A;   KNEE  SURGERY     Family History:  Family History  Problem Relation Age of Onset   Healthy Mother    Healthy Father    Healthy Brother    Cancer Neg Hx    Diabetes Neg Hx    Heart disease Neg Hx    Hypertension Neg Hx    Stroke Neg Hx    Hearing loss Neg Hx    Family Psychiatric  History:  See H&P  Social History:  Social History   Substance and Sexual Activity  Alcohol Use Yes   Comment: 10 weekly     Social History   Substance and Sexual Activity  Drug Use Yes   Types: Marijuana   Comment: 1/8 weekly    Social History   Socioeconomic History   Marital status: Single    Spouse name: Not on file   Number of children: 2   Years of education: 9   Highest education level: Not on file  Occupational History   Occupation: disabled  Tobacco Use   Smoking status: Every Day    Types: Cigars   Smokeless tobacco: Never  Vaping Use   Vaping status: Never Used  Substance and Sexual Activity   Alcohol use: Yes    Comment: 10 weekly   Drug use: Yes    Types: Marijuana    Comment: 1/8 weekly   Sexual activity: Not Currently    Birth control/protection: None  Other Topics Concern   Not on file  Social History Narrative   Lives alone in a one story home.  Has one child.     On disability for depression and PTSD.     Education: 9th grade.    She is in school for criminal justice.   Social Drivers of Corporate investment banker Strain: Low Risk  (12/24/2018)   Overall Financial Resource Strain (CARDIA)    Difficulty of Paying Living Expenses: Not hard at all  Food Insecurity: No Food Insecurity (06/06/2024)   Hunger Vital Sign    Worried About Running Out of Food in the Last Year: Never true    Ran Out of Food in the Last Year: Never true  Transportation Needs: No Transportation Needs (06/06/2024)   PRAPARE - Administrator, Civil Service (Medical): No    Lack of Transportation (Non-Medical): No  Physical Activity: Not on file  Stress: No Stress Concern  Present (12/24/2018)   [employer] of Occupational Health - Occupational Stress Questionnaire    Feeling of Stress : Not at all  Social Connections: Not on file    Current Medications: Current Facility-Administered Medications  Medication Dose Route Frequency Provider Last Rate Last Admin   acetaminophen  (TYLENOL ) tablet 650 mg  650 mg Oral Q6H PRN Rollene Katz, MD   650 mg at 06/09/24 1636   alum & mag hydroxide-simeth (MAALOX/MYLANTA) 200-200-20 MG/5ML suspension 30 mL  30 mL Oral Q4H PRN Rollene Katz, MD       ARIPiprazole  (ABILIFY ) tablet 2 mg  2 mg Oral  Daily Karsen Fellows, Jerrell LABOR, MD   2 mg at 06/10/24 0747   haloperidol  (HALDOL ) tablet 5 mg  5 mg Oral TID PRN Rollene Katz, MD       And   diphenhydrAMINE  (BENADRYL ) capsule 50 mg  50 mg Oral TID PRN Rollene Katz, MD       haloperidol  lactate (HALDOL ) injection 5 mg  5 mg Intramuscular TID PRN Rollene Katz, MD       And   diphenhydrAMINE  (BENADRYL ) injection 50 mg  50 mg Intramuscular TID PRN Rollene Katz, MD       And   LORazepam  (ATIVAN ) injection 2 mg  2 mg Intramuscular TID PRN Rollene Katz, MD       haloperidol  lactate (HALDOL ) injection 10 mg  10 mg Intramuscular TID PRN Rollene Katz, MD       And   diphenhydrAMINE  (BENADRYL ) injection 50 mg  50 mg Intramuscular TID PRN Rollene Katz, MD       And   LORazepam  (ATIVAN ) injection 2 mg  2 mg Intramuscular TID PRN Rollene Katz, MD       hydrOXYzine  (ATARAX ) tablet 25 mg  25 mg Oral TID PRN Rollene Katz, MD   25 mg at 06/09/24 2133   magnesium  hydroxide (MILK OF MAGNESIA) suspension 30 mL  30 mL Oral Daily PRN Rollene Katz, MD       nicotine  (NICODERM CQ  - dosed in mg/24 hours) patch 14 mg  14 mg Transdermal Q0600 Rollene Katz, MD   14 mg at 06/10/24 0745   nicotine  polacrilex (NICORETTE ) gum 2 mg  2 mg Oral PRN Prentis Kitchens A, DO   2 mg at 06/08/24 1704   nitrofurantoin  (macrocrystal-monohydrate)  (MACROBID ) capsule 100 mg  100 mg Oral BID Rollene Katz, MD   100 mg at 06/10/24 0747   potassium chloride  (KLOR-CON  M) CR tablet 10 mEq  10 mEq Oral Once Rollene Katz, MD       sertraline  (ZOLOFT ) tablet 50 mg  50 mg Oral Daily Paliy, Alisa, MD   50 mg at 06/10/24 0747   traZODone  (DESYREL ) tablet 50 mg  50 mg Oral QHS Tangala Wiegert, Jerrell LABOR, MD        Lab Results: No results found for this or any previous visit (from the past 48 hours).  Blood Alcohol level:  Lab Results  Component Value Date   ETH <10 06/21/2023   ETH <10 12/04/2021    Metabolic Disorder Labs: Lab Results  Component Value Date   HGBA1C 5.2 06/06/2024   MPG 103 06/06/2024   MPG 105.41 12/08/2021   No results found for: PROLACTIN Lab Results  Component Value Date   CHOL 159 12/08/2021   TRIG 54 06/21/2023   HDL 34 (L) 12/08/2021   CHOLHDL 4.7 12/08/2021   VLDL 35 12/08/2021   LDLCALC 90 12/08/2021   LDLCALC 116 (H) 06/29/2021    Physical Findings: AIMS:  ,  ,  ,  ,  ,  ,   CIWA:    COWS:     Musculoskeletal: Strength & Muscle Tone: within normal limits Gait & Station: normal Patient leans: N/A  Psychiatric Specialty Exam:  Presentation  General Appearance:  Casually dressed, overweight, not in any distress, appropriate behavior, engaged politely.  No EPS.  Eye Contact: Good.  Speech: Spontaneous.  Normal rate, tone and volume.  Mood and Affect  Mood: Euthymic.Alexandra Gray  Affect: Mobilizing affect appropriately.  Thought Process  Thought Processes: Linear and goal directed.  Descriptions of Associations:Intact  Orientation:Full (Time, Place and Person)  Thought Content: No negative ruminations.  No guilty rumination.  No current suicidal thoughts.  No homicidal thoughts.  No thoughts of violence.  Future-oriented.  No delusional theme.  No obsessions.  Hallucinations: No hallucination in any modality.  Sensorium   Memory: Good.  Judgment: Good.  Insight: Good  Executive Functions  Concentration: Good.  Attention Span: Good.  Recall: Good.  Fund of Knowledge: Good.  Language: Good   Psychomotor Activity  Improving psychomotor activity  Physical Exam: Physical Exam ROS Blood pressure (!) 131/94, pulse 67, temperature 98 F (36.7 C), resp. rate 20, height 5' (1.524 m), weight 130.3 kg, SpO2 100%. Body mass index is 56.09 kg/m.   Treatment Plan Summary: Patient has tolerated recent adjustment well.  Her mood is stabilizing appropriately.  Subjective poor sleep yesterday and likely due to activating effect of aripiprazole  which she got later in the day.  Patient will get it this morning.  We will add trazodone  at bedtime to help with insomnia.  We will keep her other medicines the same.  We will hopefully get collateral from family and finalize aftercare in a couple of days.  1.  Sertraline  50 mg daily. 2.  Aripiprazole  2 mg daily. 3.  Trazodone  50 mg at bedtime. 4.  Continue to encourage unit groups and therapeutic activities. 5.  Continue to monitor mood behavior and interaction with others. 6.  Social worker will obtain collateral from family. 7.  Social worker will coordinate discharge and aftercare planning.   Jerrell DELENA Forehand, MD 06/10/2024, 2:48 PM

## 2024-06-10 NOTE — Progress Notes (Signed)
 Adult Psychoeducational Group Note  Date:  06/10/2024 Time:  9:48 AM  Group Topic/Focus:  Goals Group:   The focus of this group is to help patients establish daily goals to achieve during treatment and discuss how the patient can incorporate goal setting into their daily lives to aide in recovery.  Participation Level:  Active  Participation Quality:  Appropriate  Affect:  Appropriate  Cognitive:  Appropriate  Insight: Appropriate  Engagement in Group:  Engaged  Modes of Intervention:  Discussion  Additional Comments:  Pt stated she is feeling drowsy.  Pt goal for the day is to keep herself up and use her coping skills.  Daine Pillar D 06/10/2024, 9:48 AM

## 2024-06-10 NOTE — Plan of Care (Signed)
   Problem: Education: Goal: Knowledge of [facility] Education information/materials will improve Outcome: Progressing Goal: Emotional status will improve Outcome: Progressing Goal: Mental status will improve Outcome: Progressing Goal: Verbalization of understanding the information provided will improve Outcome: Progressing

## 2024-06-10 NOTE — Plan of Care (Signed)
   Problem: Education: Goal: Emotional status will improve Outcome: Progressing Goal: Mental status will improve Outcome: Progressing   Problem: Activity: Goal: Sleeping patterns will improve Outcome: Progressing   Problem: Safety: Goal: Periods of time without injury will increase Outcome: Progressing

## 2024-06-10 NOTE — Group Note (Signed)
 Date:  06/10/2024 Time:  9:47 PM  Group Topic/Focus:  Wrap-Up Group:   The focus of this group is to help patients review their daily goal of treatment and discuss progress on daily workbooks.    Additional Comments:  Pt was encouraged, but opted out of attending wrap up group this evening.   Alexandra Gray 06/10/2024, 9:47 PM

## 2024-06-10 NOTE — Progress Notes (Signed)
(  Sleep Hours) -5.5 (Any PRNs that were needed, meds refused, or side effects to meds)- Pt complained of HA, blurred vision at times, pt not given Trazodone  due to pt sleep (Any disturbances and when (visitation, over night)- N/A (Concerns raised by the patient)- head hurt, pt encouraged to talk to doctor about possible side effects of medication , pt does have Hx of HA and TBI (SI/HI/AVH)- denies

## 2024-06-10 NOTE — Progress Notes (Signed)
   06/10/24 2015  Charting Type  Charting Type Shift assessment  Safety Check Verification  Has the RN verified the 15 minute safety check completion? Yes  Neurological  Neuro (WDL) WDL  HEENT  HEENT (WDL) WDL  Respiratory  Respiratory (WDL) WDL  Cardiac  Cardiac (WDL) WDL  Vascular  Vascular (WDL) WDL  Integumentary  Integumentary (WDL) WDL  Braden Scale (Ages 8 and up)  Sensory Perceptions 4  Moisture 4  Activity 4  Mobility 4  Nutrition 3  Friction and Shear 3  Braden Scale Score 22  Musculoskeletal  Musculoskeletal (WDL) WDL  Gastrointestinal  Gastrointestinal (WDL) WDL  GU Assessment  Genitourinary (WDL) WDL  Neurological  Level of Consciousness Alert

## 2024-06-10 NOTE — Plan of Care (Signed)
  Problem: Activity: Goal: Interest or engagement in activities will improve 06/10/2024 1909 by Inda Berwyn RAMAN, RN Outcome: Progressing 06/10/2024 1908 by Inda Berwyn RAMAN, RN Outcome: Progressing   Problem: Education: Goal: Emotional status will improve Outcome: Progressing Goal: Mental status will improve 06/10/2024 1909 by Inda Berwyn RAMAN, RN Outcome: Progressing 06/10/2024 1908 by Inda Berwyn RAMAN, RN Outcome: Progressing

## 2024-06-10 NOTE — Plan of Care (Addendum)
 SABRA

## 2024-06-10 NOTE — Group Note (Signed)
 Recreation Therapy Group Note   Group Topic:Communication  Group Date: 06/10/2024 Start Time: 9057 End Time: 1025 Facilitators: Chalice Philbert-McCall, LRT,CTRS Location: [address] Dayroom   Group Topic: Communication, Problem Solving   Goal Area(s) Addresses:  Patient will effectively listen to complete activity.  Patient will identify communication skills used to make activity successful.  Patient will identify how skills used during activity can be used to reach post d/c goals.    Behavioral Response: Engaged   Intervention: Building surveyor Activity - Geometric pattern cards, pencils, blank paper    Activity: Geometric Drawings.  Three volunteers from the peer group will be shown an abstract picture with a particular arrangement of geometrical shapes.  Each round, one 'speaker' will describe the pattern, as accurately as possible without revealing the image to the group.  The remaining group members will listen and draw the picture to reflect how it is described to them. Patients with the role of 'listener' cannot ask clarifying questions but, may request that the speaker repeat a direction. Once the drawings are complete, the presenter will show the rest of the group the picture and compare how close each person came to drawing the picture. LRT will facilitate a post-activity discussion regarding effective communication and the importance of planning, listening, and asking for clarification in daily interactions with others.  Education: Environmental consultant, Active listening, Support systems, Discharge planning  Education Outcome: Acknowledges understanding/In group clarification offered/Needs additional education.    Affect/Mood: Appropriate   Participation Level: Engaged   Participation Quality: Independent   Behavior: Appropriate   Speech/Thought Process: Focused   Insight: Good   Judgement: Good   Modes of Intervention: Activity   Patient Response to  Interventions:  Engaged   Education Outcome:  In group clarification offered    Clinical Observations/Individualized Feedback: Pt was engaged and attentive during group session. Pt was also a presenter during activity. Pt made a few mistakes in describing the picture she had. Pt would go back to correct those mistakes that were made. Pt appeared to have a good time and enjoyed herself.    Plan: Continue to engage patient in RT group sessions 2-3x/week.   Alexandra Gray, LRT,CTRS 06/10/2024 12:25 PM

## 2024-06-10 NOTE — Group Note (Signed)
 LCSW Group Therapy Note   Group Date: 06/10/2024 Start Time: 1100 End Time: 1200   Participation:  patient was present   Type of Therapy:  Group Therapy  Topic:  Healing Hearts: A Safe Space for Grief  Objective:  The objective of this group, Healing Hearts: A Safe Space for Grief, is to create a compassionate environment where participants can process their grief, explore different stages of grief, and discover ways to honor their loved ones through personal rituals.  3 Goals: Provide a safe and supportive space where participants feel comfortable sharing their feelings and experiences of grief without judgment. Educate participants about the stages of grief and emphasize that there is no right way to grieve or a fixed timeline for healing. Introduce the concept of rituals as a means to process grief, allowing individuals to honor their loved ones in a personal and meaningful way.  Summary:  In Healing Hearts: A Safe Space for Grief, we explored the unique and personal journey of grief, emphasizing that everyone experiences it differently. We discussed the five stages of grief (denial, anger, bargaining, depression, and acceptance), with the understanding that grief is not linear. Rituals were introduced as a way to help cope with loss, offering comfort and connection through meaningful actions such as lighting candles or taking memory walks. Participants were encouraged to express their emotions, focus on self-care, and reflect on moments of gratitude for their loved ones, recognizing that healing is a process and there is no timeline for grief.   Birtie Fellman O Jaynee Winters, LCSWA 06/10/2024  5:23 PM

## 2024-06-11 LAB — LIPID PANEL
Cholesterol: 170 mg/dL (ref 0–200)
HDL: 41 mg/dL (ref >40)
LDL Cholesterol: 102 mg/dL — ABNORMAL HIGH (ref 0–99)
Total CHOL/HDL Ratio: 4.1 ratio
Triglycerides: 134 mg/dL (ref <150)
VLDL: 27 mg/dL (ref 0–40)

## 2024-06-11 LAB — HEMOGLOBIN A1C
Hgb A1c MFr Bld: 5.2 % (ref 4.8–5.6)
Mean Plasma Glucose: 103 mg/dL

## 2024-06-11 NOTE — [facility] Suicide Risk Assessment (Signed)
 [facility] INPATIENT:  Family/Significant Other Suicide Prevention Education  Suicide Prevention Education:  Education Completed; Dannial Alstrom (mother) [phone],  (name of family member/significant other) has been identified by the patient as the family member/significant other with whom the patient will be residing, and identified as the person(s) who will aid the patient in the event of a mental health crisis (suicidal ideations/suicide attempt).  With written consent from the patient, the family member/significant other has been provided the following suicide prevention education, prior to the and/or following the discharge of the patient.  CSW called and spoke with [place], patient's mother, who confirmed patient will not have access to firearms, guns, or weapons to harm herself or others after discharge. Dannial reported she'd be willing to support patient should she have a mental health crisis. Dannial reported she'd be willing to assist patient with safely storing and securing her medications. Dannial denied having any safety concerns regarding patient discharging home.    The suicide prevention education provided includes the following: Suicide risk factors Suicide prevention and interventions National Suicide Hotline telephone number [facility] assessment telephone number [facility] Emergency Assistance 911 [facility] and/or Residential Mobile Crisis Unit telephone number  Request made of family/significant other to: Remove weapons (e.g., guns, rifles, knives), all items previously/currently identified as safety concern.   Remove drugs/medications (over-the-counter, prescriptions, illicit drugs), all items previously/currently identified as a safety concern.  The family member/significant other verbalizes understanding of the suicide prevention education information provided.  The family member/significant other agrees to remove the items of safety concern listed  above.  Omer Puccinelli M Mumin Denomme 06/11/2024, 4:14 PM

## 2024-06-11 NOTE — Plan of Care (Signed)
  Problem: Education: Goal: Emotional status will improve Outcome: Progressing Goal: Mental status will improve Outcome: Progressing   Problem: Activity: Goal: Interest or engagement in activities will improve Outcome: Progressing Goal: Sleeping patterns will improve Outcome: Progressing   Problem: Coping: Goal: Ability to demonstrate self-control will improve Outcome: Progressing   Problem: Health Behavior/Discharge Planning: Goal: Compliance with treatment plan for underlying cause of condition will improve Outcome: Progressing   Problem: Safety: Goal: Periods of time without injury will increase Outcome: Progressing

## 2024-06-11 NOTE — Progress Notes (Signed)
(  Sleep Hours) -  (Any PRNs that were needed, meds refused, or side effects to meds)- Vistaril  25mg  , Trazodone  50 mg  (Any disturbances and when (visitation, over night)- N/A  (Concerns raised by the patient)- nothing, feel good, ready to go home  (SI/HI/AVH)- denies

## 2024-06-11 NOTE — Progress Notes (Signed)
 Adult Psychoeducational Group Note  Date:  06/11/2024 Time:  8:29 PM  Group Topic/Focus:  Wrap-Up Group:   The focus of this group is to help patients review their daily goal of treatment and discuss progress on daily workbooks.  Participation Level:  Active  Participation Quality:  Appropriate  Affect:  Appropriate  Cognitive:  Appropriate  Insight: Appropriate  Engagement in Group:  Engaged  Modes of Intervention:  Discussion  Additional Comments:  Halimah attend NA wrap up group  Lang Drilling Long 06/11/2024, 8:29 PM

## 2024-06-11 NOTE — [facility] Group Notes (Signed)
 Spirituality group facilitated by Elia Rockie Sofia, [facility].  Group Description: Group focused on topic of hope. Patients participated in facilitated discussion around topic, connecting with one another around experiences and definitions for hope. Group members engaged with visual explorer photos, reflecting on what hope looks like for them today. Group engaged in discussion around how their definitions of hope are present today in hospital.  Modalities: Psycho-social ed, Adlerian, Narrative, MI  Patient Progress: Alexandra Gray attended group and was an active participant at the beginning of the conversation.  Her comments demonstrated good insight.  She became drowsy partway through group and then appeared to be asleep for the second half of group. I checked in with her following group and she stated that she was doing okay and expressed no needs at this time.

## 2024-06-11 NOTE — Progress Notes (Signed)
 [facility] MD Progress Note  06/11/2024 1:35 PM Alexandra Gray  MRN:  [id number] Subjective:   33 year old African-American female, single, lives with her cousin, unemployed.  Background history of MDD recurrent, PTSD and borderline personality disorder.  Self-presented on account of worsening depression associated with suicidal thoughts.  Patient was very tearful at presentation.  She reported multiple psychosocial stressors. Routine labs significant for hypokalemia.  UDS was positive for methamphetamine and THC.  Chart reviewed today.  Patient discussed at multidisciplinary team meeting.  Nursing staff reports that patient slept well last night.  She slept for 7.25 hours.  She has been adherent with her medications.  No PRNs required. No collateral yet from Child psychotherapist.  Seen today.  Patient feels well rested today.  States that she is back to her old self.  She looks forward to going back home tomorrow.  Patient is not endorsing any worries or concerns.  No suicidal thoughts lately.  No homicidal thoughts.  No thoughts of violence.  Patient is not endorsing any symptoms of depression.  There are no symptoms of mania.  No acute PTSD symptoms.  No psychotic symptoms.  She is tolerating her medicines without any adverse effects. Supportive approach today.   Principal Problem: Severe episode of recurrent major depressive disorder, without psychotic features (HCC) Diagnosis: Principal Problem:   Severe episode of recurrent major depressive disorder, without psychotic features (HCC) Active Problems:   Suicidal ideation   Cluster B personality disorder (HCC)  Total Time spent with patient: 30 minutes  Past Psychiatric History:  See H&P  Past Medical History:  Past Medical History:  Diagnosis Date   Cesarean delivery delivered 01/04/2019   Chlamydia    Chronic hypertension during pregnancy, antepartum 07/30/2018   BP elevated 12/21, 01/29/18, 05/11/18, 07/29/18  Galerius.Gant ] Aspirin  81 mg daily after  12 weeks Current antihypertensives:  None   Baseline and surveillance labs (pulled in from [facility], refresh links as needed)  Lab Results  Component Value Date   PLT 218 07/29/2018   CREATININE 0.59 07/29/2018   AST 16 07/29/2018   ALT 15 07/29/2018              Antenatal Testing CHTN - O10.919  Group I  BP < 140/90, no preecl   Depression    doing ok now   Gonorrhea    H/O: C-section 01/04/2019   H/O: C-section 01/04/2019   History of cesarean delivery affecting pregnancy 10/03/2015   x1 in 2016, currently plans RLTCS  Declines TOLAC. Form signed 09/20/2018    History of cesarean delivery affecting pregnancy 10/03/2015   x1 in 2016, currently plans RLTCS  Declines TOLAC. Form signed 09/20/2018    History of cesarean delivery affecting pregnancy 10/03/2015   x1 in 2016, currently plans RLTCS  Declines TOLAC. Form signed 09/20/2018    History of pregnancy induced hypertension 12/06/2016   Hx of trichomoniasis    Hypertension    sometimes is up, never on meds   Mental disorder    Ovarian cyst    PID (pelvic inflammatory disease)    Sleep apnea     Past Surgical History:  Procedure Laterality Date   CESAREAN SECTION N/A 10/03/2015   Procedure: CESAREAN SECTION;  Surgeon: Norleen Edsel GAILS, MD;  Location: [facility] ORS;  Service: Obstetrics;  Laterality: N/A;   CESAREAN SECTION N/A 01/04/2019   Procedure: CESAREAN SECTION;  Surgeon: Lorence Ozell CROME, MD;  Location: [facility] BIRTHING SUITES;  Service: Obstetrics;  Laterality: N/A;   CESAREAN SECTION  N/A    Phreesia 03/08/2021   CHOLECYSTECTOMY N/A 06/23/2023   Procedure: LAPAROSCOPIC CHOLECYSTECTOMY WITH INTRAOPERATIVE CHOLANGIOGRAM;  Surgeon: Dasie Leonor CROME, MD;  Location: [facility] OR;  Service: General;  Laterality: N/A;   KNEE SURGERY     Family History:  Family History  Problem Relation Age of Onset   Healthy Mother    Healthy Father    Healthy Brother    Cancer Neg Hx    Diabetes Neg Hx    Heart disease Neg Hx    Hypertension Neg Hx    Stroke Neg Hx     Hearing loss Neg Hx    Family Psychiatric  History:  See H&P  Social History:  Social History   Substance and Sexual Activity  Alcohol Use Yes   Comment: 10 weekly     Social History   Substance and Sexual Activity  Drug Use Yes   Types: Marijuana   Comment: 1/8 weekly    Social History   Socioeconomic History   Marital status: Single    Spouse name: Not on file   Number of children: 2   Years of education: 9   Highest education level: Not on file  Occupational History   Occupation: disabled  Tobacco Use   Smoking status: Every Day    Types: Cigars   Smokeless tobacco: Never  Vaping Use   Vaping status: Never Used  Substance and Sexual Activity   Alcohol use: Yes    Comment: 10 weekly   Drug use: Yes    Types: Marijuana    Comment: 1/8 weekly   Sexual activity: Not Currently    Birth control/protection: None  Other Topics Concern   Not on file  Social History Narrative   Lives alone in a one story home.  Has one child.     On disability for depression and PTSD.     Education: 9th grade.    She is in school for criminal justice.   Social Drivers of Corporate investment banker Strain: Low Risk  (12/24/2018)   Overall Financial Resource Strain (CARDIA)    Difficulty of Paying Living Expenses: Not hard at all  Food Insecurity: No Food Insecurity (06/06/2024)   Hunger Vital Sign    Worried About Running Out of Food in the Last Year: Never true    Ran Out of Food in the Last Year: Never true  Transportation Needs: No Transportation Needs (06/06/2024)   PRAPARE - Administrator, Civil Service (Medical): No    Lack of Transportation (Non-Medical): No  Physical Activity: Not on file  Stress: No Stress Concern Present (12/24/2018)   [employer] of Occupational Health - Occupational Stress Questionnaire    Feeling of Stress : Not at all  Social Connections: Not on file    Current Medications: Current Facility-Administered Medications   Medication Dose Route Frequency Provider Last Rate Last Admin   acetaminophen  (TYLENOL ) tablet 650 mg  650 mg Oral Q6H PRN Rollene Katz, MD   650 mg at 06/10/24 1819   alum & mag hydroxide-simeth (MAALOX/MYLANTA) 200-200-20 MG/5ML suspension 30 mL  30 mL Oral Q4H PRN Rollene Katz, MD       ARIPiprazole  (ABILIFY ) tablet 2 mg  2 mg Oral Daily Bodhi Moradi A, MD   2 mg at 06/11/24 0804   haloperidol  (HALDOL ) tablet 5 mg  5 mg Oral TID PRN Rollene Katz, MD       And   diphenhydrAMINE  (BENADRYL ) capsule 50 mg  50 mg Oral TID PRN Rollene Katz, MD       haloperidol  lactate (HALDOL ) injection 5 mg  5 mg Intramuscular TID PRN Rollene Katz, MD       And   diphenhydrAMINE  (BENADRYL ) injection 50 mg  50 mg Intramuscular TID PRN Rollene Katz, MD       And   LORazepam  (ATIVAN ) injection 2 mg  2 mg Intramuscular TID PRN Rollene Katz, MD       haloperidol  lactate (HALDOL ) injection 10 mg  10 mg Intramuscular TID PRN Rollene Katz, MD       And   diphenhydrAMINE  (BENADRYL ) injection 50 mg  50 mg Intramuscular TID PRN Rollene Katz, MD       And   LORazepam  (ATIVAN ) injection 2 mg  2 mg Intramuscular TID PRN Rollene Katz, MD       hydrOXYzine  (ATARAX ) tablet 25 mg  25 mg Oral TID PRN Rollene Katz, MD   25 mg at 06/09/24 2133   magnesium  hydroxide (MILK OF MAGNESIA) suspension 30 mL  30 mL Oral Daily PRN Rollene Katz, MD       nicotine  (NICODERM CQ  - dosed in mg/24 hours) patch 14 mg  14 mg Transdermal Q0600 Rollene Katz, MD   14 mg at 06/11/24 0805   nicotine  polacrilex (NICORETTE ) gum 2 mg  2 mg Oral PRN Prentis Kitchens A, DO   2 mg at 06/08/24 1704   potassium chloride  (KLOR-CON  M) CR tablet 10 mEq  10 mEq Oral Once Rollene Katz, MD       sertraline  (ZOLOFT ) tablet 50 mg  50 mg Oral Daily Paliy, Alisa, MD   50 mg at 06/11/24 0804   traZODone  (DESYREL ) tablet 50 mg  50 mg Oral QHS Jacole Capley, Jerrell LABOR, MD        Lab  Results:  Results for orders placed or performed during the hospital encounter of 06/06/24 (from the past 48 hours)  Lipid panel     Status: Abnormal   Collection Time: 06/11/24  6:18 AM  Result Value Ref Range   Cholesterol 170 0 - 200 mg/dL   Triglycerides 865 <849 mg/dL   HDL 41 >59 mg/dL   Total CHOL/HDL Ratio 4.1 RATIO   VLDL 27 0 - 40 mg/dL   LDL Cholesterol 897 (H) 0 - 99 mg/dL    Comment:        Total Cholesterol/HDL:CHD Risk Coronary Heart Disease Risk Table                     Men   Women  1/2 Average Risk   3.4   3.3  Average Risk       5.0   4.4  2 X Average Risk   9.6   7.1  3 X Average Risk  23.4   11.0        Use the calculated Patient Ratio above and the CHD Risk Table to determine the patient's CHD Risk.        ATP III CLASSIFICATION (LDL):  <100     mg/dL   Optimal  899-870  mg/dL   Near or Above                    Optimal  130-159  mg/dL   Borderline  839-810  mg/dL   High  >809     mg/dL   Very High Performed at [facility], [address], [place], [place] [address]  Blood Alcohol level:  Lab Results  Component Value Date   ETH <10 06/21/2023   ETH <10 12/04/2021    Metabolic Disorder Labs: Lab Results  Component Value Date   HGBA1C 5.2 06/06/2024   MPG 103 06/06/2024   MPG 105.41 12/08/2021   No results found for: PROLACTIN Lab Results  Component Value Date   CHOL 170 06/11/2024   TRIG 134 06/11/2024   HDL 41 06/11/2024   CHOLHDL 4.1 06/11/2024   VLDL 27 06/11/2024   LDLCALC 102 (H) 06/11/2024   LDLCALC 90 12/08/2021    Physical Findings: AIMS:  ,  ,  ,  ,  ,  ,   CIWA:    COWS:     Musculoskeletal: Strength & Muscle Tone: within normal limits Gait & Station: normal Patient leans: N/A  Psychiatric Specialty Exam:  Presentation  General Appearance:  Casually dressed, overweight, not in any distress, appropriate behavior, engaged politely.  No EPS.  Eye Contact: Good.  Speech: Spontaneous.   Normal rate, tone and volume.  Normal prosody of speech.  Mood and Affect  Mood: Euthymic.  Affect: Mobilizing affect appropriately.  Thought Process  Thought Processes: Linear and goal directed.  Descriptions of Associations:Intact  Orientation:Full (Time, Place and Person)  Thought Content: Future-oriented.  No negative ruminations.  No guilty rumination.  No current suicidal thoughts.  No homicidal thoughts.  No thoughts of violence.  Future-oriented.  No delusional theme.  No obsessions.  Hallucinations: No hallucination in any modality.  Sensorium  Memory: Good.  Judgment: Good.  Insight: Good  Executive Functions  Concentration: Good.  Attention Span: Good.  Recall: Good.  Fund of Knowledge: Good.  Language: Good   Psychomotor Activity  Normal psychomotor activity  Physical Exam: Physical Exam ROS Blood pressure 135/84, pulse 69, temperature 98.2 F (36.8 C), temperature source Oral, resp. rate 20, height 5' (1.524 m), weight 130.3 kg, SpO2 100%. Body mass index is 56.09 kg/m.   Treatment Plan Summary: Patient has responded well to treatment.  Her mood is stable.  She has maintained normal biological rhythms.  No dangerousness.  No manic features.  No psychotic features.  We are finalizing aftercare.  Scheduled for discharge tomorrow if she maintains stability.  1.  Sertraline  50 mg daily. 2.  Aripiprazole  2 mg daily. 3.  Trazodone  50 mg at bedtime. 4.  Continue to encourage unit groups and therapeutic activities. 5.  Continue to monitor mood behavior and interaction with others. 6.  Social worker will obtain collateral from family. 7.  Social worker will coordinate discharge and aftercare planning.   Jerrell DELENA Forehand, MD 06/11/2024, 1:35 PM

## 2024-06-11 NOTE — Group Note (Signed)
 Recreation Therapy Group Note   Group Topic:Team Building  Group Date: 06/11/2024 Start Time: 9062 End Time: 1015 Facilitators: Cheyane Ayon-McCall, LRT,CTRS Location: [address] Dayroom   Group Topic: Communication, Team Building, Problem Solving  Goal Area(s) Addresses:  Patient will effectively work with peer towards shared goal.  Patient will identify skills used to make activity successful.  Patient will share challenges and verbalize solution-driven approaches used. Patient will identify how skills used during activity can be used to reach post d/c goals.   Behavioral Response: Engaged  Intervention: STEM Activity   Activity: [employer]. Patients were provided the following materials: 5 drinking straws, 5 rubber bands, 5 paper clips, 2 index cards and 2 drinking cups. Using the provided materials patients were asked to build a launching mechanism to launch a ping pong ball across the room, approximately 10 feet. Patients were divided into teams of 3-5. Instructions required all materials be incorporated into the device, functionality of items left to the peer group's discretion.  Education: Pharmacist, community, Scientist, physiological, Air cabin crew, Building control surveyor.   Education Outcome: Acknowledges education/In group clarification offered/Needs additional education.    Affect/Mood: Appropriate   Participation Level: Engaged   Participation Quality: Independent   Behavior: Appropriate   Speech/Thought Process: Focused   Insight: Good   Judgement: Good   Modes of Intervention: STEM Activity   Patient Response to Interventions:  Engaged   Education Outcome:  In group clarification offered    Clinical Observations/Individualized Feedback: Pt was bright and engaged throughout activity. Pt worked with peers in trying to come up with ways to complete the access. Pt had moments were she would watch peers to gain their thoughts on the activity.     Plan: Continue to  engage patient in RT group sessions 2-3x/week.   Zala Degrasse-McCall, LRT,CTRS 06/11/2024 12:28 PM

## 2024-06-11 NOTE — Progress Notes (Signed)
   06/11/24 1100  Psych Admission Type (Psych Patients Only)  Admission Status Voluntary  Psychosocial Assessment  Patient Complaints None  Eye Contact Brief  Facial Expression Flat  Affect Appropriate to circumstance  Speech Logical/coherent  Interaction Cautious  Motor Activity Slow  Appearance/Hygiene Unremarkable  Behavior Characteristics Cooperative  Mood Pleasant  Thought Process  Coherency WDL  Content WDL  Delusions None reported or observed  Perception WDL  Hallucination None reported or observed  Judgment Limited  Confusion None  Danger to Self  Current suicidal ideation? Denies  Agreement Not to Harm Self Yes  Description of Agreement verbal  Danger to Others  Danger to Others None reported or observed

## 2024-06-11 NOTE — BHH Group Notes (Signed)
 The focus of this group is to help patients establish daily goals to achieve during treatment and discuss how the patient can incorporate goal setting into their daily lives to aide in recovery.       Scale 1-10: 7      Goals: Stay awake

## 2024-06-11 NOTE — Plan of Care (Signed)
  Problem: Education: Goal: Emotional status will improve Outcome: Progressing Goal: Verbalization of understanding the information provided will improve Outcome: Progressing   Problem: Activity: Goal: Sleeping patterns will improve Outcome: Progressing   Problem: Coping: Goal: Ability to demonstrate self-control will improve Outcome: Progressing   Problem: Health Behavior/Discharge Planning: Goal: Compliance with treatment plan for underlying cause of condition will improve Outcome: Progressing

## 2024-06-12 DIAGNOSIS — F6089 Other specific personality disorders: Secondary | ICD-10-CM

## 2024-06-12 DIAGNOSIS — R45851 Suicidal ideations: Secondary | ICD-10-CM

## 2024-06-12 DIAGNOSIS — F332 Major depressive disorder, recurrent severe without psychotic features: Principal | ICD-10-CM

## 2024-06-12 MED ORDER — ARIPIPRAZOLE 2 MG PO TABS: 2.0000 mg | ORAL_TABLET | Freq: Every day | ORAL | 0 refills | Status: AC

## 2024-06-12 MED ORDER — NICOTINE 14 MG/24HR TD PT24: 14.0000 mg | MEDICATED_PATCH | Freq: Every day | TRANSDERMAL | 0 refills | Status: AC

## 2024-06-12 MED ORDER — SERTRALINE HCL 50 MG PO TABS: 50.0000 mg | ORAL_TABLET | Freq: Every day | ORAL | 0 refills | Status: AC

## 2024-06-12 MED ORDER — TRAZODONE HCL 50 MG PO TABS: 50.0000 mg | ORAL_TABLET | Freq: Every day | ORAL | 0 refills | Status: AC

## 2024-06-12 NOTE — Discharge Instructions (Signed)
 Please see information below regarding [employer] Plan:  [employer]: [phone] Inquire about care management services and interest in ACT team.

## 2024-06-12 NOTE — Progress Notes (Signed)
Discharge Note:  Patient denies SI/HI/AVH at this time. Discharge instructions, AVS, prescriptions, and transition record gone over with patient. Patient agrees to adhere with medication management, follow-up visit, and outpatient therapy. Patient belongings returned to patient. Patient questions and concerns addressed and answered. Patient ambulatory off unit. Patient discharged to home.    

## 2024-06-12 NOTE — Progress Notes (Signed)
  [facility] Adult Case Management Discharge Plan :  Will you be returning to the same living situation after discharge:  Yes,  pt will be returning home after discharge.  At discharge, do you have transportation home?: Yes,  pt's mother will be providing transportation home.  Do you have the ability to pay for your medications: Yes,  pt is insured - [employer]  Release of information consent forms completed and in the chart;  Patient's signature needed at discharge.  Patient to Follow up at:  Follow-up Information     [facility] Follow up on 06/16/2024.   Why: Please call this provider on 06/16/24 at 10:00 am to schedule any necessary appointments, as we were unable to contact prior to your discharge. Contact information: [address] [place], [place] [address]  P : [phone]        [facility]. Go on 06/24/2024.   Specialty: Behavioral Health Why: Please go to this provider on 06/24/24 at 7:00 am for an assessment, to obtain medication management services.  You may also go on Monday through Friday, arrive by 7:00 am. Contact information: [address] [place]  [address] [phone]        [facility]. Go on 06/18/2024.   Specialty: Professional Counselor Why: Please go to this provider on 06/18/24 at 9:00 am for an assessment, to obtain medication management services. You may also go on Monday through Friday, from 9 am to 1 pm. Contact information: [address] [place]  [address] [place] [address] [phone]                 Next level of care provider has access to [facility] Link:no  Safety Planning and Suicide Prevention discussed: Yes,  completed with Dannial Alstrom (mother) [phone].     Has patient been referred to the Quitline?: Patient refused referral for treatment  Patient has been referred for addiction treatment: Patient refused referral for treatment.  Lillybeth Tal M  Lamarion Mcevers, LCSWA 06/12/2024, 9:13 AM

## 2024-06-12 NOTE — Progress Notes (Signed)
   06/12/24 0800  Psych Admission Type (Psych Patients Only)  Admission Status Voluntary  Psychosocial Assessment  Patient Complaints None  Eye Contact Brief  Facial Expression Flat  Affect Appropriate to circumstance  Speech Logical/coherent  Interaction Minimal  Motor Activity Slow  Appearance/Hygiene Unremarkable  Behavior Characteristics Cooperative  Mood Pleasant  Thought Process  Coherency WDL  Content WDL  Delusions None reported or observed  Perception WDL  Hallucination None reported or observed  Judgment Limited  Confusion None  Danger to Self  Current suicidal ideation? Denies  Agreement Not to Harm Self Yes  Description of Agreement verbal  Danger to Others  Danger to Others None reported or observed
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
## 2024-06-12 NOTE — BHH Suicide Risk Assessment (Signed)
 [facility] Discharge Suicide Risk Assessment   Principal Problem: Severe episode of recurrent major depressive disorder, without psychotic features (HCC) Discharge Diagnoses: Principal Problem:   Severe episode of recurrent major depressive disorder, without psychotic features (HCC) Active Problems:   Suicidal ideation   Cluster B personality disorder (HCC)   Total Time spent with patient: 45 minutes  Musculoskeletal: Strength & Muscle Tone: within normal limits Gait & Station: normal Patient leans: N/A  Psychiatric Specialty Exam  Presentation  General Appearance:  Casually dressed, not in any distress, appropriate behavior, engaged politely.  No EPS.  Eye Contact: Good.  Speech: Spontaneous.  Normal rate, tone and volume.  Normal prosody of speech.  Mood and Affect  Mood: Euthymic.  Affect: Full range and appropriate.  Thought Process  Thought Processes: Linear and goal directed.  Descriptions of Associations:Intact  Orientation:Full (Time, Place and Person)  Thought Content: Future oriented.  No current suicidal thoughts.  No homicidal thoughts.  No thoughts of violence.  No negative ruminative flooding.  No guilty ruminations.  No delusional theme.  No obsessions.  Hallucinations: No hallucination in any modality.  Sensorium  Memory: Good.  Judgment: Good.  Insight: Good  Executive Functions  Concentration: Good.  Attention Span: Good.  Recall: Good.  Fund of Knowledge: Good.  Language: Good   Psychomotor Activity  Normal psychomotor activity   Physical Exam: Physical Exam ROS Blood pressure 120/85, pulse 67, temperature 99 F (37.2 C), temperature source Oral, resp. rate 20, height 5' (1.524 m), weight 130.3 kg, SpO2 100%. Body mass index is 56.09 kg/m.  Mental Status Per Nursing Assessment::   On Admission:  Suicidal ideation indicated by patient, Suicide plan  Demographic Factors:  Adolescent or young adult  Loss  Factors: NA  Historical Factors: Family history of mental illness or substance abuse and Impulsivity  Risk Reduction Factors:   Sense of responsibility to family, Employed, Living with another person, especially a relative, Positive social support, Positive therapeutic relationship, and Positive coping skills or problem solving skills  Continued Clinical Symptoms:  No residual mood symptoms.  No psychotic symptoms.  No overwhelming anxiety.  Cognitive Features That Contribute To Risk:  None    Suicide Risk:  Minimal: No identifiable suicidal ideation.  Patients presenting with no risk factors but with morbid ruminations; may be classified as minimal risk based on the severity of the depressive symptoms   Follow-up Information     [facility] Follow up on 06/16/2024.   Why: Please call this provider on 06/16/24 at 10:00 am to schedule any necessary appointments, as we were unable to contact prior to your discharge. Contact information: [address], [place], [place] [address]  P : [phone]        [facility]. Go on 06/24/2024.   Specialty: Behavioral Health Why: Please go to this provider on 06/24/24 at 7:00 am for an assessment, to obtain medication management services.  You may also go on Monday through Friday, arrive by 7:00 am. Contact information: [address] [place]  [address] [phone]        [place] Go on 06/18/2024.   Specialty: Professional Counselor Why: Please go to this provider on 06/18/24 at 9:00 am for an assessment, to obtain medication management services. You may also go on Monday through Friday, from 9 am to 1 pm. Contact information: [address]  [address] [place] [address] [phone]  Plan Of Care/Follow-up recommendations:  See discharge summary  Jerrell DELENA Forehand, MD 06/12/2024, 11:27 AM

## 2024-06-23 ENCOUNTER — Telehealth: Payer: MEDICAID | Admitting: Family Medicine

## 2024-06-23 DIAGNOSIS — J4540 Moderate persistent asthma, uncomplicated: Secondary | ICD-10-CM | POA: Diagnosis not present

## 2024-06-23 MED ORDER — ALBUTEROL SULFATE HFA 108 (90 BASE) MCG/ACT IN AERS: 2.0000 | INHALATION_SPRAY | Freq: Four times a day (QID) | RESPIRATORY_TRACT | 0 refills | Status: AC | PRN

## 2024-06-23 MED ORDER — PREDNISONE 20 MG PO TABS: 20.0000 mg | ORAL_TABLET | Freq: Two times a day (BID) | ORAL | 0 refills | Status: AC

## 2024-06-23 NOTE — Progress Notes (Signed)

## 2024-08-31 ENCOUNTER — Telehealth: Payer: MEDICAID | Admitting: Family Medicine

## 2024-08-31 ENCOUNTER — Telehealth: Payer: MEDICAID | Admitting: Family

## 2024-08-31 DIAGNOSIS — N939 Abnormal uterine and vaginal bleeding, unspecified: Secondary | ICD-10-CM

## 2024-08-31 NOTE — Progress Notes (Signed)
   Thank you for the details you included in the comment boxes. Those details are very helpful in determining the best course of treatment for you and help us  to provide the best care.Because of your abnormal bleeding, we recommend that you schedule a Virtual Urgent Care video visit in order for the provider to better assess what is going on.  The provider will be able to give you a more accurate diagnosis and treatment plan if we can more freely discuss your symptoms and with the addition of a virtual examination.   If you change your visit to a video visit, we will bill your insurance (similar to an office visit) and you will not be charged for this e-Visit. You will be able to stay at home and speak with the first available [facility] advanced practice provider. The link to do a video visit is in the drop down Menu tab of your Welcome screen in MyChart.

## 2024-08-31 NOTE — Progress Notes (Signed)
 Pt did not show up for visit-DWB
